# Patient Record
Sex: Female | Born: 1960 | Race: White | Hispanic: No | Smoking: Former smoker
Health system: Southern US, Community
[De-identification: ages and names within clinical notes are randomized; demographics above are authoritative.]

## PROBLEM LIST (undated history)

## (undated) DIAGNOSIS — I10 Essential (primary) hypertension: Secondary | ICD-10-CM

## (undated) DIAGNOSIS — E785 Hyperlipidemia, unspecified: Secondary | ICD-10-CM

## (undated) DIAGNOSIS — J449 Chronic obstructive pulmonary disease, unspecified: Secondary | ICD-10-CM

## (undated) DIAGNOSIS — I099 Rheumatic heart disease, unspecified: Secondary | ICD-10-CM

## (undated) DIAGNOSIS — M1990 Unspecified osteoarthritis, unspecified site: Secondary | ICD-10-CM

## (undated) DIAGNOSIS — G4730 Sleep apnea, unspecified: Secondary | ICD-10-CM

## (undated) DIAGNOSIS — U071 COVID-19: Secondary | ICD-10-CM

## (undated) DIAGNOSIS — I509 Heart failure, unspecified: Secondary | ICD-10-CM

## (undated) DIAGNOSIS — T7840XA Allergy, unspecified, initial encounter: Secondary | ICD-10-CM

## (undated) DIAGNOSIS — K219 Gastro-esophageal reflux disease without esophagitis: Secondary | ICD-10-CM

## (undated) HISTORY — DX: Sleep apnea, unspecified: G47.30

## (undated) HISTORY — DX: Chronic obstructive pulmonary disease, unspecified: J44.9

## (undated) HISTORY — DX: Allergy, unspecified, initial encounter: T78.40XA

## (undated) HISTORY — DX: Unspecified osteoarthritis, unspecified site: M19.90

## (undated) HISTORY — DX: Hyperlipidemia, unspecified: E78.5

## (undated) HISTORY — DX: Gastro-esophageal reflux disease without esophagitis: K21.9

## (undated) HISTORY — DX: Rheumatic heart disease, unspecified: I09.9

## (undated) HISTORY — DX: Essential (primary) hypertension: I10

## (undated) HISTORY — PX: LEG SURGERY: SHX1003

## (undated) HISTORY — PX: CARDIAC CATHETERIZATION: SHX172

## (undated) HISTORY — PX: CORONARY ARTERY BYPASS GRAFT: SHX141

## (undated) HISTORY — DX: Heart failure, unspecified: I50.9

## (undated) HISTORY — PX: CHOLECYSTECTOMY: SHX55

## (undated) HISTORY — PX: TONSILLECTOMY AND ADENOIDECTOMY: SUR1326

## (undated) HISTORY — DX: COVID-19: U07.1

## (undated) HISTORY — PX: CARDIAC VALVE REPLACEMENT: SHX585

## (undated) SURGERY — Surgical Case
Anesthesia: *Unknown

---

## 2015-06-26 ENCOUNTER — Telehealth: Payer: Self-pay | Admitting: Cardiovascular Disease

## 2015-06-26 NOTE — Telephone Encounter (Signed)
Received records from [facility] for appointment on 06/28/15 with Dr Sallyanne Kuster.  Records given to [facility] (medical records) for Dr Croitoru's schedule on 06/28/15. lp

## 2015-06-28 ENCOUNTER — Ambulatory Visit (INDEPENDENT_AMBULATORY_CARE_PROVIDER_SITE_OTHER): Payer: 59 | Admitting: Cardiovascular Disease

## 2015-06-28 ENCOUNTER — Encounter: Payer: Self-pay | Admitting: Cardiovascular Disease

## 2015-06-28 VITALS — BP 168/70 | HR 76 | Ht 65.0 in | Wt 190.0 lb

## 2015-06-28 DIAGNOSIS — R002 Palpitations: Secondary | ICD-10-CM

## 2015-06-28 DIAGNOSIS — I10 Essential (primary) hypertension: Secondary | ICD-10-CM | POA: Diagnosis not present

## 2015-06-28 DIAGNOSIS — R011 Cardiac murmur, unspecified: Secondary | ICD-10-CM | POA: Diagnosis not present

## 2015-06-28 MED ORDER — ATENOLOL 50 MG PO TABS: ORAL_TABLET | ORAL | Status: DC

## 2015-06-28 NOTE — Progress Notes (Signed)
Patient ID: Kimberly Hobbs, female   DOB: [birth year], 55 y.o.   MRN: [id number]    Cardiology Office Note    Date:  06/29/2015   ID:  Kimberly Hobbs, DOB [birth year], MRN [id number]  PCP:  No primary care provider on file.  Cardiologist:   Sanda Klein, MD   Chief Complaint  Patient presents with   New Evaluation   Palpitations    LASTS 2 HOURS DAILY    History of Present Illness:  Kimberly Hobbs is a 55 y.o. female with a long-standing history of hypertension presenting with complaints of palpitations that occur randomly and are most prominent at rest and at night. She describes them as a skipped beats every 2-5 normal beats. She also has symptoms of perimenopausal state and feels that the palpitations have worsened since she began having hot flashes. She denies syncope, exertional dyspnea, exertional angina, leg edema, claudication, focal neurological complaints.  She initially was diagnosed with high blood pressure during pregnancy related toxemia about 16 years ago. Her blood pressure never normalized after pregnancy. It sounds like her blood pressure has never really been well controlled with a typical systolic blood pressure in the 160s range. For the last 3 weeks she increased her atenolol from 25 mg daily to 50 mg daily and finds that both her palpitations and her blood pressure are little better. Her typical systolic blood pressure remains around the 160 mmHg.   She walks approximately a mile in 30 minutes or less on a daily basis; she has to climb up and down steps frequently during the day and does not have any complaints. She does feel that she occasionally becomes short of breath if she "pushes".  She also reports recently having labs performed by her primary care provider. Her thyroid function tests were reportedly normal. Her cholesterol was improved from the past but still over 200. Her glucose was "borderline".  She has a very distinct murmur on physical exam that has not previously  been reported to her.  Past Medical History  Diagnosis Date   Hyperlipidemia    Hypertension     Past Surgical History  Procedure Laterality Date   Tonsillectomy and adenoidectomy     Cholecystectomy     Leg surgery      "metal plate in leg"    Current Medications: No outpatient prescriptions prior to visit.   No facility-administered medications prior to visit.     Allergies:   Iodinated diagnostic agents   Social History   Social History   Marital Status: Unknown    Spouse Name: N/A   Number of Children: N/A   Years of Education: N/A   Social History Main Topics   Smoking status: Current Some Day Smoker   Smokeless tobacco: Never Used   Alcohol Use: Yes   Drug Use: No   Sexual Activity: Not Asked   Other Topics Concern   None   Social History Narrative   Epworth Sleepiness Scale = 4 (as of 06/28/2015)     Family History:  The patient's family history includes Cancer in her father and mother; Diabetes in her father and mother; Heart disease in her father and mother; Hyperlipidemia in her mother; Hypertension in her brother, brother, father, mother, and sister.   ROS:   Please see the history of present illness.    ROS All other systems reviewed and are negative.   PHYSICAL EXAM:   VS:  BP 168/70 mmHg   Pulse 76   Ht 5\' 5"  (1.651  m)   Wt 86.183 kg (190 lb)   BMI 31.62 kg/m2   GEN: Well nourished, well developed, in no acute distress HEENT: normal Neck: no JVD, carotid bruits, or masses Cardiac: RRR; 3/6 holosystolic apical murmur that does not change with the Valsalva maneuver, no diastolic murmurs, rubs, or gallops,no edema  Respiratory:  clear to auscultation bilaterally, normal work of breathing GI: soft, nontender, nondistended, + BS MS: no deformity or atrophy Skin: warm and dry, no rash Neuro:  Alert and Oriented x 3, Strength and sensation are intact Psych: euthymic mood, full affect  Wt Readings from Last 3 Encounters:  06/28/15  86.183 kg (190 lb)      Studies/Labs Reviewed:    Recent Labs: Not available at this time   ASSESSMENT:    1. Murmur, cardiac   2. Essential hypertension   3. Murmur   4. Palpitations      PLAN:  In order of problems listed above:  1. She has a typical murmur of mitral insufficiency.. Recommend an echocardiogram. If she has significant mitral insufficiency, this might explain her tendency to have atrial arrhythmia. I did talk to her about the possible need to have transesophageal echo to better identify the mechanism of mitral insufficiency, if this proves to be significant 2. The pressure is poorly controlled. Talked about the fact that hypertension causes long-term sequelae and will worsen mitral insufficiency. Increase the atenolol to a total of 75 mg daily. 3. I don't think she is describing atrial fibrillation. Will review left atrial size and severity of mitral insufficiency. Decide whether or not we need to some type of rhythm monitor depending on the results of the echo and her response to increase beta blocker dose.    Medication Adjustments/Labs and Tests Ordered: Current medicines are reviewed at length with the patient today.  Concerns regarding medicines are outlined above.  Medication changes, Labs and Tests ordered today are listed in the Patient Instructions below. Patient Instructions  Medication Instructions: Dr Sallyanne Kuster has recommended making the following medication changes: 1. INCREASE Atenolol to 25 mg (0.5 tablet) in the morning and 50 mg (1 tablet) in the evening  Labwork: NONE ORDERED  Testing/Procedures: 1. Echocardiogram - Your physician has requested that you have an echocardiogram. Echocardiography is a painless test that uses sound waves to create images of your heart. It provides your doctor with information about the size and shape of your heart and how well your hearts chambers and valves are working. This procedure takes approximately one  hour. There are no restrictions for this procedure.  Follow-up: Dr Sallyanne Kuster recommends that you schedule a follow-up appointment in 1 month.  If you need a refill on your cardiac medications before your next appointment, please call your pharmacy.    Mikael Spray, MD  06/29/2015 12:15 PM    [facility] [place], [place], [place]  [address] Phone: [phone]; Fax: [phone]

## 2015-06-28 NOTE — Patient Instructions (Signed)
Medication Instructions: Dr Sallyanne Kuster has recommended making the following medication changes: 1. INCREASE Atenolol to 25 mg (0.5 tablet) in the morning and 50 mg (1 tablet) in the evening  Labwork: NONE ORDERED  Testing/Procedures: 1. Echocardiogram - Your physician has requested that you have an echocardiogram. Echocardiography is a painless test that uses sound waves to create images of your heart. It provides your doctor with information about the size and shape of your heart and how well your hearts chambers and valves are working. This procedure takes approximately one hour. There are no restrictions for this procedure.  Follow-up: Dr Sallyanne Kuster recommends that you schedule a follow-up appointment in 1 month.  If you need a refill on your cardiac medications before your next appointment, please call your pharmacy.

## 2015-06-29 ENCOUNTER — Other Ambulatory Visit: Payer: Self-pay

## 2015-06-29 ENCOUNTER — Telehealth: Payer: Self-pay | Admitting: Cardiovascular Disease

## 2015-06-29 DIAGNOSIS — I480 Paroxysmal atrial fibrillation: Secondary | ICD-10-CM

## 2015-06-29 DIAGNOSIS — R002 Palpitations: Secondary | ICD-10-CM | POA: Insufficient documentation

## 2015-06-29 DIAGNOSIS — I4891 Unspecified atrial fibrillation: Secondary | ICD-10-CM | POA: Insufficient documentation

## 2015-06-29 DIAGNOSIS — R011 Cardiac murmur, unspecified: Secondary | ICD-10-CM | POA: Insufficient documentation

## 2015-06-29 DIAGNOSIS — I10 Essential (primary) hypertension: Secondary | ICD-10-CM | POA: Insufficient documentation

## 2015-06-29 HISTORY — DX: Paroxysmal atrial fibrillation: I48.0

## 2015-06-29 MED ORDER — ATENOLOL 50 MG PO TABS: ORAL_TABLET | ORAL | Status: DC

## 2015-06-29 NOTE — Telephone Encounter (Signed)
New message      Calling to let the nurse know they received a refill for atenolol----this needs to be sent to the patient's regular pharmacy

## 2015-07-13 ENCOUNTER — Ambulatory Visit (HOSPITAL_COMMUNITY): Payer: 59 | Attending: Cardiovascular Disease

## 2015-07-13 ENCOUNTER — Other Ambulatory Visit: Payer: Self-pay

## 2015-07-13 DIAGNOSIS — I059 Rheumatic mitral valve disease, unspecified: Secondary | ICD-10-CM | POA: Insufficient documentation

## 2015-07-13 DIAGNOSIS — I351 Nonrheumatic aortic (valve) insufficiency: Secondary | ICD-10-CM | POA: Insufficient documentation

## 2015-07-13 DIAGNOSIS — I119 Hypertensive heart disease without heart failure: Secondary | ICD-10-CM | POA: Insufficient documentation

## 2015-07-13 DIAGNOSIS — Z720 Tobacco use: Secondary | ICD-10-CM | POA: Diagnosis not present

## 2015-07-13 DIAGNOSIS — R011 Cardiac murmur, unspecified: Secondary | ICD-10-CM | POA: Diagnosis not present

## 2015-07-19 ENCOUNTER — Encounter: Payer: Self-pay | Admitting: Cardiovascular Disease

## 2015-07-19 ENCOUNTER — Telehealth: Payer: Self-pay | Admitting: Cardiovascular Disease

## 2015-07-19 NOTE — Telephone Encounter (Signed)
F/u  Pt returning RN phone call- echo results. .Please call back and discuss.

## 2015-07-19 NOTE — Telephone Encounter (Signed)
F/up  Pt called for ECHO results

## 2015-07-20 NOTE — Telephone Encounter (Signed)
Notes Recorded by Dionne Bucy Dell Hurtubise, CMA on 07/19/2015 at 5:08 PM Patient is scheduled for 07/27/2015 with Dr C at 9:30a. Called patient with time and instructions. Patient verbalized understanding. Notes Recorded by Dionne Bucy Makinze Jani, CMA on 07/19/2015 at 3:40 PM Called patient with results. Patient verbalized understanding and agreed with plan. Will get patient scheduled for 07/27/15 with Dr C. Will call patient with date, time and instructions.

## 2015-07-26 ENCOUNTER — Other Ambulatory Visit: Payer: Self-pay | Admitting: Cardiovascular Disease

## 2015-07-26 ENCOUNTER — Telehealth: Payer: Self-pay | Admitting: Cardiovascular Disease

## 2015-07-26 DIAGNOSIS — I340 Nonrheumatic mitral (valve) insufficiency: Secondary | ICD-10-CM

## 2015-07-26 DIAGNOSIS — I050 Rheumatic mitral stenosis: Secondary | ICD-10-CM

## 2015-07-26 DIAGNOSIS — I351 Nonrheumatic aortic (valve) insufficiency: Secondary | ICD-10-CM

## 2015-07-26 NOTE — Telephone Encounter (Signed)
Spoke to patient and clarified instructions for cardioversion in hospital tomorrow 6/8, return to office Friday 6/9. Pt voiced thanks for call, had initial confusion bc she thought Sylvia's appt reminder call was in reference to the cardioversion.

## 2015-07-26 NOTE — Telephone Encounter (Signed)
New Message   Patient c/o Palpitations:  High priority if patient c/o lightheadedness and shortness of breath.  1. How long have you been having palpitations? About a week   2. Are you currently experiencing lightheadedness and shortness of breath? no  3. Have you checked your BP and heart rate? (document readings) 146/72  4. Are you experiencing any other symptoms? Heart skipping beats  Pt states appt was suppose to be 6/8, but provider is not in the office. Pt states she was told her appt would be on this date. I did explain to pt provider would not be in office. Pt would like a call back to discuss and talk about her palpitations. Please call back to discuss

## 2015-07-26 NOTE — Telephone Encounter (Signed)
Please call,pt thought her appt was tomorrow. She can not come Friday and she needs to be seen.

## 2015-07-26 NOTE — Addendum Note (Signed)
Addended by: Diana Eves on: 07/26/2015 08:24 AM   Modules accepted: Orders

## 2015-07-27 ENCOUNTER — Encounter (HOSPITAL_COMMUNITY)
Admission: RE | Disposition: A | Discharge status: Home / Self Care | Payer: Self-pay | Source: Ambulatory Visit | Attending: Cardiovascular Disease

## 2015-07-27 ENCOUNTER — Ambulatory Visit (HOSPITAL_COMMUNITY)
Admission: RE | Admit: 2015-07-27 | Discharge: 2015-07-27 | Disposition: A | Discharge status: Home / Self Care | Payer: 59 | Source: Ambulatory Visit | Attending: Cardiovascular Disease | Admitting: Cardiovascular Disease

## 2015-07-27 ENCOUNTER — Encounter (HOSPITAL_COMMUNITY): Payer: Self-pay | Admitting: *Deleted

## 2015-07-27 ENCOUNTER — Ambulatory Visit (HOSPITAL_BASED_OUTPATIENT_CLINIC_OR_DEPARTMENT_OTHER): Payer: 59

## 2015-07-27 ENCOUNTER — Telehealth: Payer: Self-pay | Admitting: Cardiovascular Disease

## 2015-07-27 DIAGNOSIS — I080 Rheumatic disorders of both mitral and aortic valves: Secondary | ICD-10-CM | POA: Diagnosis not present

## 2015-07-27 DIAGNOSIS — I340 Nonrheumatic mitral (valve) insufficiency: Secondary | ICD-10-CM | POA: Diagnosis not present

## 2015-07-27 DIAGNOSIS — I050 Rheumatic mitral stenosis: Secondary | ICD-10-CM | POA: Insufficient documentation

## 2015-07-27 DIAGNOSIS — F17200 Nicotine dependence, unspecified, uncomplicated: Secondary | ICD-10-CM | POA: Diagnosis not present

## 2015-07-27 DIAGNOSIS — I493 Ventricular premature depolarization: Secondary | ICD-10-CM | POA: Diagnosis not present

## 2015-07-27 DIAGNOSIS — I10 Essential (primary) hypertension: Secondary | ICD-10-CM | POA: Diagnosis not present

## 2015-07-27 DIAGNOSIS — I099 Rheumatic heart disease, unspecified: Secondary | ICD-10-CM | POA: Diagnosis present

## 2015-07-27 DIAGNOSIS — I351 Nonrheumatic aortic (valve) insufficiency: Secondary | ICD-10-CM | POA: Insufficient documentation

## 2015-07-27 HISTORY — PX: TEE WITHOUT CARDIOVERSION: SHX5443

## 2015-07-27 LAB — ECHO TEE
CHL CUP MV M VEL: 108
MV Annulus VTI: 43.7 cm
MVAP: 4.23 cm2
MVG: 5 mmHg
MVSPHT: 52 ms
VTI: 200 cm

## 2015-07-27 SURGERY — ECHOCARDIOGRAM, TRANSESOPHAGEAL
Anesthesia: Moderate Sedation

## 2015-07-27 MED ORDER — MIDAZOLAM HCL 5 MG/ML IJ SOLN
INTRAMUSCULAR | Status: AC
  Filled 2015-07-27: qty 2

## 2015-07-27 MED ORDER — FENTANYL CITRATE (PF) 100 MCG/2ML IJ SOLN
INTRAMUSCULAR | Status: AC
  Filled 2015-07-27: qty 2

## 2015-07-27 MED ORDER — SODIUM CHLORIDE 0.9 % IV SOLN
INTRAVENOUS | Status: DC
  Administered 2015-07-27: 500 mL via INTRAVENOUS

## 2015-07-27 MED ORDER — MIDAZOLAM HCL 10 MG/2ML IJ SOLN
INTRAMUSCULAR | Status: DC | PRN
  Administered 2015-07-27: 1 mg via INTRAVENOUS
  Administered 2015-07-27 (×2): 2 mg via INTRAVENOUS

## 2015-07-27 MED ORDER — FENTANYL CITRATE (PF) 100 MCG/2ML IJ SOLN
INTRAMUSCULAR | Status: DC | PRN
  Administered 2015-07-27: 25 ug via INTRAVENOUS
  Administered 2015-07-27: 50 ug via INTRAVENOUS

## 2015-07-27 MED ORDER — CARVEDILOL 12.5 MG PO TABS: 12.5000 mg | ORAL_TABLET | Freq: Two times a day (BID) | ORAL | Status: DC

## 2015-07-27 MED ORDER — BUTAMBEN-TETRACAINE-BENZOCAINE 2-2-14 % EX AERO
INHALATION_SPRAY | CUTANEOUS | Status: DC | PRN
  Administered 2015-07-27: 2 via TOPICAL

## 2015-07-27 NOTE — Op Note (Signed)
INDICATIONS: rheumatic heart disease  PROCEDURE:   Informed consent was obtained prior to the procedure. The risks, benefits and alternatives for the procedure were discussed and the patient comprehended these risks.  Risks include, but are not limited to, cough, sore throat, vomiting, nausea, somnolence, esophageal and stomach trauma or perforation, bleeding, low blood pressure, aspiration, pneumonia, infection, trauma to the teeth and death.    After a procedural time-out, the oropharynx was anesthetized with 20% benzocaine spray.   During this procedure the patient was administered a total of Versed 5 mg and Fentanyl 75 mcg to achieve and maintain moderate conscious sedation.  The patient's heart rate, blood pressure, and oxygen saturationweare monitored continuously during the procedure. The period of conscious sedation was 13 minutes, of which I was present face-to-face 100% of this time.  The transesophageal probe was inserted in the esophagus and stomach without difficulty and multiple views were obtained.  The patient was kept under observation until the patient left the procedure room.  The patient left the procedure room in stable condition.   Agitated microbubble saline contrast was not administered.  COMPLICATIONS:    There were no immediate complications.  FINDINGS:  Rheumatic mitral and aortic valve changes. Mild-moderate MR, mild MS, mild AI Normal LVEF. PVCs seen during the study, no PACs or atrial fibrillation   RECOMMENDATIONS:   Rheumatic fever prophylaxis. Monitor periodically. Good BP control.  Time Spent Directly with the Patient:  35 minutes   Jun Rightmyer 07/27/2015, 10:23 AM

## 2015-07-27 NOTE — Telephone Encounter (Signed)
Closed encounter °

## 2015-07-27 NOTE — Discharge Instructions (Signed)

## 2015-07-27 NOTE — H&P (View-Only) (Signed)
Patient ID: [name], female   DOB: Jan 31, 1961, 55 y.o.   MRN: [id number]    Cardiology Office Note    Date:  06/29/2015   ID:  Brenton Arbaiza, DOB 1960-11-12, MRN [id number]  PCP:  No primary care provider on file.  Cardiologist:   Sanda Klein, MD   Chief Complaint  Patient presents with   New Evaluation   Palpitations    LASTS 2 HOURS DAILY    History of Present Illness:  Jourdin Ocasio is a 55 y.o. female with a long-standing history of hypertension presenting with complaints of palpitations that occur randomly and are most prominent at rest and at night. She describes them as a skipped beats every 2-5 normal beats. She also has symptoms of perimenopausal state and feels that the palpitations have worsened since she began having hot flashes. She denies syncope, exertional dyspnea, exertional angina, leg edema, claudication, focal neurological complaints.  She initially was diagnosed with high blood pressure during pregnancy related toxemia about 16 years ago. Her blood pressure never normalized after pregnancy. It sounds like her blood pressure has never really been well controlled with a typical systolic blood pressure in the 160s range. For the last 3 weeks she increased her atenolol from 25 mg daily to 50 mg daily and finds that both her palpitations and her blood pressure are little better. Her typical systolic blood pressure remains around the 160 mmHg.   She walks approximately a mile in 30 minutes or less on a daily basis; she has to climb up and down steps frequently during the day and does not have any complaints. She does feel that she occasionally becomes short of breath if she "pushes".  She also reports recently having labs performed by her primary care provider. Her thyroid function tests were reportedly normal. Her cholesterol was improved from the past but still over 200. Her glucose was "borderline".  She has a very distinct murmur on physical exam that has not previously  been reported to her.  Past Medical History  Diagnosis Date   Hyperlipidemia    Hypertension     Past Surgical History  Procedure Laterality Date   Tonsillectomy and adenoidectomy     Cholecystectomy     Leg surgery      "metal plate in leg"    Current Medications: No outpatient prescriptions prior to visit.   No facility-administered medications prior to visit.     Allergies:   Iodinated diagnostic agents   Social History   Social History   Marital Status: Unknown    Spouse Name: N/A   Number of Children: N/A   Years of Education: N/A   Social History Main Topics   Smoking status: Current Some Day Smoker   Smokeless tobacco: Never Used   Alcohol Use: Yes   Drug Use: No   Sexual Activity: Not Asked   Other Topics Concern   None   Social History Narrative   Epworth Sleepiness Scale = 4 (as of 06/28/2015)     Family History:  The patient's family history includes Cancer in her father and mother; Diabetes in her father and mother; Heart disease in her father and mother; Hyperlipidemia in her mother; Hypertension in her brother, brother, father, mother, and sister.   ROS:   Please see the history of present illness.    ROS All other systems reviewed and are negative.   PHYSICAL EXAM:   VS:  BP 168/70 mmHg   Pulse 76   Ht 5\' 5"  (1.651  m)   Wt 86.183 kg (190 lb)   BMI 31.62 kg/m2   GEN: Well nourished, well developed, in no acute distress HEENT: normal Neck: no JVD, carotid bruits, or masses Cardiac: RRR; 3/6 holosystolic apical murmur that does not change with the Valsalva maneuver, no diastolic murmurs, rubs, or gallops,no edema  Respiratory:  clear to auscultation bilaterally, normal work of breathing GI: soft, nontender, nondistended, + BS MS: no deformity or atrophy Skin: warm and dry, no rash Neuro:  Alert and Oriented x 3, Strength and sensation are intact Psych: euthymic mood, full affect  Wt Readings from Last 3 Encounters:  06/28/15  86.183 kg (190 lb)      Studies/Labs Reviewed:    Recent Labs: Not available at this time   ASSESSMENT:    1. Murmur, cardiac   2. Essential hypertension   3. Murmur   4. Palpitations      PLAN:  In order of problems listed above:  1. She has a typical murmur of mitral insufficiency.. Recommend an echocardiogram. If she has significant mitral insufficiency, this might explain her tendency to have atrial arrhythmia. I did talk to her about the possible need to have transesophageal echo to better identify the mechanism of mitral insufficiency, if this proves to be significant 2. The pressure is poorly controlled. Talked about the fact that hypertension causes long-term sequelae and will worsen mitral insufficiency. Increase the atenolol to a total of 75 mg daily. 3. I don't think she is describing atrial fibrillation. Will review left atrial size and severity of mitral insufficiency. Decide whether or not we need to some type of rhythm monitor depending on the results of the echo and her response to increase beta blocker dose.    Medication Adjustments/Labs and Tests Ordered: Current medicines are reviewed at length with the patient today.  Concerns regarding medicines are outlined above.  Medication changes, Labs and Tests ordered today are listed in the Patient Instructions below. Patient Instructions  Medication Instructions: Dr Sallyanne Kuster has recommended making the following medication changes: 1. INCREASE Atenolol to 25 mg (0.5 tablet) in the morning and 50 mg (1 tablet) in the evening  Labwork: NONE ORDERED  Testing/Procedures: 1. Echocardiogram - Your physician has requested that you have an echocardiogram. Echocardiography is a painless test that uses sound waves to create images of your heart. It provides your doctor with information about the size and shape of your heart and how well your hearts chambers and valves are working. This procedure takes approximately one  hour. There are no restrictions for this procedure.  Follow-up: Dr Sallyanne Kuster recommends that you schedule a follow-up appointment in 1 month.  If you need a refill on your cardiac medications before your next appointment, please call your pharmacy.    Mikael Spray, MD  06/29/2015 12:15 PM    [facility] [place], [place], [place]  [address] Phone: [phone]; Fax: [phone]

## 2015-07-27 NOTE — Progress Notes (Signed)
°  Echocardiogram Echocardiogram Transesophageal has been performed.  Kimberly Hobbs M 07/27/2015, 10:40 AM

## 2015-07-27 NOTE — Interval H&P Note (Signed)
History and Physical Interval Note:  07/27/2015 8:52 AM  Kimberly Hobbs  has presented today for surgery, with the diagnosis of MITRAL VALVE AND AORTIC VALVE  The various methods of treatment have been discussed with the patient and family. After consideration of risks, benefits and other options for treatment, the patient has consented to  Procedure(s): TRANSESOPHAGEAL ECHOCARDIOGRAM (TEE) (N/A) as a surgical intervention .  The patient's history has been reviewed, patient examined, no change in status, stable for surgery.  I have reviewed the patient's chart and labs.  Questions were answered to the patient's satisfaction.     [name]

## 2015-07-28 ENCOUNTER — Ambulatory Visit: Payer: 59 | Admitting: Cardiovascular Disease

## 2015-07-28 ENCOUNTER — Encounter (HOSPITAL_COMMUNITY): Payer: Self-pay | Admitting: Cardiovascular Disease

## 2015-09-13 ENCOUNTER — Telehealth: Payer: Self-pay | Admitting: Cardiovascular Disease

## 2015-09-13 MED ORDER — CARVEDILOL 25 MG PO TABS: 25.0000 mg | ORAL_TABLET | Freq: Two times a day (BID) | ORAL | 5 refills | Status: DC

## 2015-09-13 NOTE — Telephone Encounter (Signed)
New Message  Pt call requesting to speak with RN. Pt stays her bp and heart rate have still been racing. Pt states heart has been skipping beats. Pt states appt is a ways off and does not think she can handle her heart racing because hers cause her to be anxious. Pt wants to know if Dr. Sallyanne Kuster could prescribe her something for anxiety. Please call back to discuss

## 2015-09-13 NOTE — Telephone Encounter (Signed)
I would recommend increasing her carvedilol to 25 mg BID, but please ask if she has recently checked her BP. This should help with palpitations. She will have to discuss anxiolytics with her PCP or one of his colleagues. Thanks, [employer]

## 2015-09-13 NOTE — Telephone Encounter (Signed)
Called patient. She states her heart is skipping beats. When her heart skips beats, this causes her anxiety/stress. She states she has anxiety the majority of the day.  She has lost her job due to her condition  Patient wants anxiolytic Patient has a PCP but PCP is out of town Advised that she should contact PCP for this medication - cardiology does not generally Rx these medications  - will defer to MD  She states she is doing everything she can - dropped 30lbs, walks more  There was mention of monitor in last MD note 06/28/15  Patient has [facility] 8/11 w/Dr. Sallyanne Kuster

## 2015-09-13 NOTE — Telephone Encounter (Signed)
Called patient and provided MD recommendations. She agrees to increase coreg from 12.5mg  BID to 25mg  BID. Rx(s) sent to pharmacy electronically.  She reports her BP has been running 130s/60s  Advised that she monitor her BP and HR and bring readings to her appt with Dr. Loletha Grayer on 8/11.   Patient voiced understanding

## 2015-09-29 ENCOUNTER — Encounter (INDEPENDENT_AMBULATORY_CARE_PROVIDER_SITE_OTHER): Payer: Self-pay

## 2015-09-29 ENCOUNTER — Encounter: Payer: Self-pay | Admitting: Cardiovascular Disease

## 2015-09-29 ENCOUNTER — Ambulatory Visit (INDEPENDENT_AMBULATORY_CARE_PROVIDER_SITE_OTHER): Payer: 59 | Admitting: Cardiovascular Disease

## 2015-09-29 VITALS — BP 190/90 | HR 75 | Ht 65.0 in | Wt 172.6 lb

## 2015-09-29 DIAGNOSIS — R002 Palpitations: Secondary | ICD-10-CM

## 2015-09-29 DIAGNOSIS — I080 Rheumatic disorders of both mitral and aortic valves: Secondary | ICD-10-CM

## 2015-09-29 DIAGNOSIS — I10 Essential (primary) hypertension: Secondary | ICD-10-CM | POA: Diagnosis not present

## 2015-09-29 MED ORDER — METOPROLOL SUCCINATE ER 100 MG PO TB24: 100.0000 mg | ORAL_TABLET | Freq: Every day | ORAL | 3 refills | Status: DC

## 2015-09-29 NOTE — Patient Instructions (Signed)
Medication Instructions: Dr Sallyanne Kuster has recommended making the following medication changes: 1. STOP Carvedilol 2. START Metoprolol Succinate 100 mg - take 1 tablet (100 mg total) by mouth daily  Labwork: NONE ORDERED  Testing/Procedures: 1. 24-Hour Holter Monitor - Your physician has recommended that you wear a holter monitor. Holter monitors are medical devices that record the hearts electrical activity. Doctors most often use these monitors to diagnose arrhythmias. Arrhythmias are problems with the speed or rhythm of the heartbeat. The monitor is a small, portable device. You can wear one while you do your normal daily activities. This is usually used to diagnose what is causing palpitations/syncope (passing out).  Follow-up: Dr Sallyanne Kuster recommends that you schedule a follow-up appointment in 6 weeks.  If you need a refill on your cardiac medications before your next appointment, please call your pharmacy.

## 2015-09-29 NOTE — Progress Notes (Signed)
Patient ID: Kimberly Hobbs, female   DOB: Mar 23, 1960, 55 y.o.   MRN: [id number]    Cardiology Office Note    Date:  10/01/2015   ID:  Kimberly Hobbs, DOB [birth year], MRN [id number]  PCP:  Pcp Not In System  Cardiologist:   Sanda Klein, MD   No chief complaint on file.   History of Present Illness:  [name] is a 55 y.o. 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 Also has mild aortic insufficiency.  The palpitations are mostly single isolated beats, but recently she believes she has had some sustained arrhythmia lasting for several minutes. She had improvement on carvedilol, but the palpitations seem to recur around 4 or 5:00 in the afternoon, almost as if her medication is wearing off. She denies syncope, exertional dyspnea, exertional angina, leg edema, claudication, focal neurological complaints.  She initially was diagnosed with high blood pressure during pregnancy related toxemia about 16 years ago. Her blood pressure never normalized after pregnancy. She reports that now her blood pressure at home is consistently in the 120s/60s, but it is very high in the office today at 190/90, even when I rechecked it.  She walks approximately a mile in 30 minutes or less on a daily basis; she has to climb up and down steps frequently during the day and does not have any complaints. Unfortunately, she was dismissed from her job due to numerous absences from medical problems.  Past Medical History:  Diagnosis Date   Hyperlipidemia    Hypertension     Past Surgical History:  Procedure Laterality Date   CHOLECYSTECTOMY     LEG SURGERY     "metal plate in leg"   TEE WITHOUT CARDIOVERSION N/A 07/27/2015   Procedure: TRANSESOPHAGEAL ECHOCARDIOGRAM (TEE);  Surgeon: Sanda Klein, MD;  Location: [facility] END[facility]COPY;  Service: Cardiovascular;   Laterality: N/A;   TONSILLECTOMY AND ADENOIDECTOMY      Current Medications: Outpatient Medications Prior to Visit  Medication Sig Dispense Refill   carvedilol (COREG) 25 MG tablet Take 1 tablet (25 mg total) by mouth 2 (two) times daily with a meal. 60 tablet 5   No facility-administered medications prior to visit.      Allergies:   Iodinated diagnostic agents   Social History   Social History   Marital status: Married    Spouse name: N/A   Number of children: N/A   Years of education: N/A   Social History Main Topics   Smoking status: Current Some Day Smoker   Smokeless tobacco: Never Used   Alcohol use Yes   Drug use: No   Sexual activity: Not Asked   Other Topics Concern   None   Social History Narrative   Epworth Sleepiness Scale = 4 (as of 06/28/2015)     Family History:  The patient's family history includes Cancer in her father and mother; Diabetes in her father and mother; Heart disease in her father and mother; Hyperlipidemia in her mother; Hypertension in her brother, brother, father, mother, and sister.   ROS:   Please see the history of present illness.    ROS All other systems reviewed and are negative.   PHYSICAL EXAM:   VS:  BP (!) 190/90    Pulse 75    Ht 5\' 5"  (1.651 m)    Wt 172 lb 9.6 oz (78.3 kg)    BMI 28.72 kg/m  GEN: Well nourished, well developed, in no acute distress  HEENT: normal  Neck: no JVD, carotid bruits, or masses Cardiac: RRR; 3/6 holosystolic apical murmur that does not change with the Valsalva maneuver, no diastolic murmurs, rubs, or gallops,no edema  Respiratory:  clear to auscultation bilaterally, normal work of breathing GI: soft, nontender, nondistended, + BS MS: no deformity or atrophy  Skin: warm and dry, no rash Neuro:  Alert and Oriented x 3, Strength and sensation are intact Psych: euthymic mood, full affect  Wt Readings from Last 3 Encounters:  09/29/15 172 lb 9.6 oz (78.3 kg)  07/27/15 180 lb (81.6  kg)  06/28/15 190 lb (86.2 kg)      ASSESSMENT:    1. 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 Palpitations   3. Essential hypertension      PLAN:  In order of problems listed above:  1. Rheumatic MR/MS/AR: Echo confirms rheumatic changes of the aortic and mitral allows with the dominant lesion being mild to moderate mitral insufficiency. None of the abnormalities appears hemodynamically significant at this time, but it is possible that the valvular problems are contributing to atrial arrhythmia. Discussed the concept of rheumatic fever prophylaxis, but she is not in the high risk group and routine penicillin does not appear to justify. Pharyngitis should be evaluated and treated promptly. 2. Palpitations: I don't think she is describing atrial fibrillation, is a possibility since she has rheumatic heart disease and would have substantial implications for treatment. Her palpitations are daily. I have recommended that she wear a Holter monitor. If atrial fibrillation is identified she should receive full anticoagulation. Will switch to a long-acting beta blocker, metoprolol succinate to see if this provides better palpitation relief. 3. HTN: Her blood pressure is quite high today, but she has consistently normal blood pressure recordings with her home monitor. Ask her to bring her monitor and to be checked. She might have whitecoat hypertension. Reevaluate after the change in her beta blocker.    Medication Adjustments/Labs and Tests Ordered: Current medicines are reviewed at length with the patient today.  Concerns regarding medicines are outlined above.  Medication changes, Labs and Tests ordered today are listed in the Patient Instructions below. Patient Instructions  Medication Instructions: Dr Sallyanne Kuster has recommended making the following medication changes: 1. STOP Carvedilol 2. START Metoprolol Succinate 100 mg - take 1 tablet (100 mg total) by mouth  daily  Labwork: NONE ORDERED  Testing/Procedures: 1. 24-Hour Holter Monitor - Your physician has recommended that you wear a holter monitor. Holter monitors are medical devices that record the hearts electrical activity. Doctors most often use these monitors to diagnose arrhythmias. Arrhythmias are problems with the speed or rhythm of the heartbeat. The monitor is a small, portable device. You can wear one while you do your normal daily activities. This is usually used to diagnose what is causing palpitations/syncope (passing out).  Follow-up: Dr Sallyanne Kuster recommends that you schedule a follow-up appointment in 6 weeks.  If you need a refill on your cardiac medications before your next appointment, please call your pharmacy.    Signed, Sanda Klein, MD  10/01/2015 11:08 AM    [facility] [place], [place], [place]  [address] Phone: [phone]; Fax: [phone]

## 2015-10-04 ENCOUNTER — Telehealth: Payer: Self-pay | Admitting: Cardiovascular Disease

## 2015-10-04 NOTE — Telephone Encounter (Signed)
Returned call to patient. Advised it can take up to around 2 weeks before she may notice a change in BP readings. Advised to continue to monitor - take no more than 2x daily, about 1-2 hours after taking medication, after she has been resting/sitting for 5-10 minutes at least. Advised to call back if she does not notice a change in BP and to report readings to provider for further med adjustment if necessary. She voiced understanding.

## 2015-10-04 NOTE — Telephone Encounter (Signed)
Patient saw Dr. Loletha Grayer on August 11 and was given new BP medicine.  So far she has not seen decrease in BP. Is that normal?

## 2015-10-05 ENCOUNTER — Encounter (INDEPENDENT_AMBULATORY_CARE_PROVIDER_SITE_OTHER): Payer: Self-pay

## 2015-10-05 ENCOUNTER — Ambulatory Visit (INDEPENDENT_AMBULATORY_CARE_PROVIDER_SITE_OTHER): Payer: 59

## 2015-10-05 DIAGNOSIS — R002 Palpitations: Secondary | ICD-10-CM

## 2015-10-06 ENCOUNTER — Telehealth: Payer: Self-pay | Admitting: Cardiovascular Disease

## 2015-10-06 NOTE — Telephone Encounter (Signed)
Note not needed 

## 2015-10-16 ENCOUNTER — Telehealth: Payer: Self-pay | Admitting: Cardiovascular Disease

## 2015-10-16 NOTE — Addendum Note (Signed)
Addended by: Patria Mane A on: 10/16/2015 03:47 PM   Modules accepted: Orders

## 2015-10-16 NOTE — Telephone Encounter (Signed)
Returned patient call-pt wanted to report BP readings after medication changes on 8/11: (carvedilol was d/c and metoprolol succinate was started)  8/26 148/66 171/74 153/60  8/27 159/93 169/77 168/67  8/28 151/66   Pt also reports that the palpitations decreased initially but last night they came back, although reports she thinks the medication is "trying" to help with the palpitations and has seen a decrease overall.    Advised to continue to monitor BP and I would send to MD Croitoru to make him aware of BP readings.  Pt verbalized understanding.

## 2015-10-16 NOTE — Telephone Encounter (Signed)
Kimberly Hobbs Is calling because Dr. Sallyanne Kuster changed her blood Pressure medication(Metoprolol Succinate ER  100mg  ) and her bottom numbers are low but the the top numbers are running between 165 -170. Please call

## 2015-10-16 NOTE — Telephone Encounter (Signed)
Contacted patient and made aware of MD Croitoru recommendations:  Sanda Klein, MD   10/16/15 3:33 PM  Note    There is room to increase the medication a little bit more. Please have her take the Toprol succinate 100 mg every evening and 50 mg every morning. Okay to cut the 100 mg tablets in half.      Advised to monitor BP and HR and keep a log, call back in 1-2 weeks with readings.  Pt verbalized understanding and advised to call with concerns and/or further questions.

## 2015-10-16 NOTE — Telephone Encounter (Signed)
There is room to increase the medication a little bit more. Please have her take the Toprol succinate 100 mg every evening and 50 mg every morning. Okay to cut the 100 mg tablets in half.

## 2015-11-15 ENCOUNTER — Other Ambulatory Visit: Payer: Self-pay | Admitting: *Deleted

## 2015-11-15 ENCOUNTER — Telehealth: Payer: Self-pay | Admitting: Cardiovascular Disease

## 2015-11-15 MED ORDER — METOPROLOL SUCCINATE ER 100 MG PO TB24: ORAL_TABLET | ORAL | 3 refills | Status: DC

## 2015-11-15 NOTE — Telephone Encounter (Signed)
New Message   *STAT* If patient is at the pharmacy, call can be transferred to refill team.   1. Which medications need to be refilled? (please list name of each medication and dose if known) Metoprolol Succinate 100 mg 24 hr tablet  2. Which pharmacy/location (including street and city if local pharmacy) is medication to be sent to? No pharmacy listed  3. Do they need a 30 day or 90 day supply? 30 day  Pt voiced she has not taken her medication for her heart today and she has sent up the prescription twice and no one has refilled her medication yet.  Was not able to get pharmacy location due to pt immediately hanging up.  Please f/u with pt

## 2015-11-15 NOTE — Telephone Encounter (Signed)
Confirmed w [facility] and called pt back to inform receipt of refill. She voiced understanding and thanks.

## 2015-11-15 NOTE — Telephone Encounter (Signed)
Needs note completion before I can close - thanks.

## 2015-11-15 NOTE — Telephone Encounter (Signed)
Returned call and clarified need. She notes dose was increased but a refill wasn't submitted. Reviewed dose and confirmed she is taking as directed by cardiology. Med refilled at increased count for 90 day dispense (1.5 tabs per day = 135 tabs over 90 days) Informed patient I would call [facility] to confirm receipt of this and coverage.

## 2015-11-17 ENCOUNTER — Encounter: Payer: Self-pay | Admitting: Cardiovascular Disease

## 2015-11-17 ENCOUNTER — Ambulatory Visit (INDEPENDENT_AMBULATORY_CARE_PROVIDER_SITE_OTHER): Payer: 59 | Admitting: Cardiovascular Disease

## 2015-11-17 VITALS — BP 170/80 | HR 64 | Ht 65.0 in | Wt 173.2 lb

## 2015-11-17 DIAGNOSIS — I10 Essential (primary) hypertension: Secondary | ICD-10-CM | POA: Diagnosis not present

## 2015-11-17 DIAGNOSIS — I080 Rheumatic disorders of both mitral and aortic valves: Secondary | ICD-10-CM | POA: Diagnosis not present

## 2015-11-17 DIAGNOSIS — I493 Ventricular premature depolarization: Secondary | ICD-10-CM | POA: Diagnosis not present

## 2015-11-17 MED ORDER — NEBIVOLOL HCL 20 MG PO TABS: 20.0000 mg | ORAL_TABLET | Freq: Every day | ORAL | 11 refills | Status: DC

## 2015-11-17 NOTE — Patient Instructions (Addendum)
Dr Sallyanne Kuster has recommended making the following medication changes: 1. START Bystolic 20 mg - take 1 tablet by mouth daily  Dr C recommends that you schedule a follow-up appointment in 6 months. You will receive a reminder letter in the mail two months in advance. If you don't receive a letter, please call our office to schedule the follow-up appointment.  If you need a refill on your cardiac medications before your next appointment, please call your pharmacy.    Medication samples have been provided to the patient. Drug name: Bystolic 20 mg tabs Qty: 28 tabs LOT: [id number] Exp.Date: 11/2016

## 2015-11-17 NOTE — Progress Notes (Signed)
Patient ID: Kimberly Hobbs, female   DOB: 1960/04/16, 54 y.o.   MRN: [id number]    Cardiology Office Note    Date:  11/17/2015   ID:  Kimberly Hobbs, DOB 06/22/1960, MRN [id number]  PCP:  Pcp Not In System  Cardiologist:   Sanda Klein, MD   Chief Complaint  Patient presents with   Follow-up    History of Present Illness:  Kimberly Hobbs is a 55 y.o. 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 Also has mild aortic insufficiency. Valve changes suggests rheumatic etiology.  Although the situation has improved, palpitations remain her biggest complaint. The higher dose of metoprolol seems to work better than carvedilol did. She continues to have occasional days where the palpitations are quite troublesome. The Holter monitor showed that she has frequent PVCs. There was no evidence of atrial fibrillation.  He complains of nightmares and wonders these could be related to her beta blocker.  She initially was diagnosed with high blood pressure during pregnancy related toxemia about 16 years ago. Her blood pressure never normalized after pregnancy. She reports that now her blood pressure at home is consistently in the 120s/60s, but it is still high when checked in the office.  She walks approximately a mile in 30 minutes or less on a daily basis; she has to climb up and down steps frequently during the day and does not have any complaints. Unfortunately, she was dismissed from her job due to numerous absences from medical problems.  Past Medical History:  Diagnosis Date   Hyperlipidemia    Hypertension     Past Surgical History:  Procedure Laterality Date   CHOLECYSTECTOMY     LEG SURGERY     "metal plate in leg"   TEE WITHOUT CARDIOVERSION N/A 07/27/2015   Procedure: TRANSESOPHAGEAL ECHOCARDIOGRAM (TEE);  Surgeon: Sanda Klein, MD;   Location: [facility] ENDOSCOPY;  Service: Cardiovascular;  Laterality: N/A;   TONSILLECTOMY AND ADENOIDECTOMY      Current Medications: Outpatient Medications Prior to Visit  Medication Sig Dispense Refill   metoprolol succinate (TOPROL-XL) 100 MG 24 hr tablet Take 50 mg (1/2 tablet) in the morning and take 100 mg (1 tablet) in the evening. Take with or immediately following a meal. 135 tablet 3   No facility-administered medications prior to visit.      Allergies:   Iodinated diagnostic agents   Social History   Social History   Marital status: Married    Spouse name: N/A   Number of children: N/A   Years of education: N/A   Social History Main Topics   Smoking status: Current Some Day Smoker   Smokeless tobacco: Never Used   Alcohol use Yes   Drug use: No   Sexual activity: Not Asked   Other Topics Concern   None   Social History Narrative   Epworth Sleepiness Scale = 4 (as of 06/28/2015)     Family History:  The patient's family history includes Cancer in her father and mother; Diabetes in her father and mother; Heart disease in her father and mother; Hyperlipidemia in her mother; Hypertension in her brother, brother, father, mother, and sister.   ROS:   Please see the history of present illness.    ROS All other systems reviewed and are negative.   PHYSICAL EXAM:   VS:  BP (!) 170/80 (BP Location: Right Arm, Patient Position: Sitting, Cuff Size: Normal)  Pulse 64    Ht 5\' 5"  (1.651 m)    Wt 78.6 kg (173 lb 3.2 oz)    SpO2 97%    BMI 28.82 kg/m    GEN: Well nourished, well developed, in no acute distress  HEENT: normal  Neck: no JVD, carotid bruits, or masses Cardiac: RRR; 3/6 holosystolic apical murmur that does not change with the Valsalva maneuver, no diastolic murmurs, rubs, or gallops,no edema  Respiratory:  clear to auscultation bilaterally, normal work of breathing GI: soft, nontender, nondistended, + BS MS: no deformity or atrophy  Skin: warm and  dry, no rash Neuro:  Alert and Oriented x 3, Strength and sensation are intact Psych: euthymic mood, full affect  Wt Readings from Last 3 Encounters:  11/17/15 78.6 kg (173 lb 3.2 oz)  09/29/15 78.3 kg (172 lb 9.6 oz)  07/27/15 81.6 kg (180 lb)      ASSESSMENT:    1. 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 PVCs (premature ventricular contractions)   3. Essential hypertension      PLAN:  In order of problems listed above:  1. Rheumatic MR/MS/AR: Echo confirms rheumatic changes of the aortic and mitral allows with the dominant lesion being mild to moderate mitral insufficiency. None of the abnormalities appears hemodynamically significant at this time. Discussed the concept of rheumatic fever prophylaxis, but she is not in a high risk group and routine penicillin does not appear justified. Pharyngitis should be always evaluated and treated promptly. 2. PVCs: Toprol-XL did work better than carvedilol, but she still has palpitations, has nightmares which might be related to the medication. Will give a trial of nebivolol which does not cross the blood-brain barrier to see if this leads to improvement.. 3. HTN: Her blood pressure is quite high today, but she has consistently normal blood pressure recordings with her home monitor. Still has to bring in her home monitor to be checked.  Reevaluate after the change in her beta blocker.  She'll call us back in a few weeks to tell us how the new medication works for her symptoms and her blood pressure  Medication Adjustments/Labs and Tests Ordered: Current medicines are reviewed at length with the patient today.  Concerns regarding medicines are outlined above.  Medication changes, Labs and Tests ordered today are listed in the Patient Instructions below. Patient Instructions  Dr Sallyanne Kuster has recommended making the following medication changes: 1. START Bystolic 20 mg - take 1 tablet by mouth daily  Dr C recommends that you schedule  a follow-up appointment in 6 months. You will receive a reminder letter in the mail two months in advance. If you don't receive a letter, please call our office to schedule the follow-up appointment.  If you need a refill on your cardiac medications before your next appointment, please call your pharmacy.    Medication samples have been provided to the patient. Drug name: Bystolic 20 mg tabs Qty: 28 tabs LOT: [id number] Exp.Date: 11/2016    SignedSanda Klein, MD  11/17/2015 2:35 PM    [facility], [place], [place]  [address] Phone: [phone]; Fax: [phone]

## 2015-11-22 ENCOUNTER — Telehealth: Payer: Self-pay | Admitting: Cardiovascular Disease

## 2015-11-22 MED ORDER — METOPROLOL SUCCINATE ER 100 MG PO TB24
100.0000 mg | ORAL_TABLET | Freq: Two times a day (BID) | ORAL | 3 refills | Status: DC
Start: 2015-11-22 — End: 2015-12-29

## 2015-11-22 NOTE — Telephone Encounter (Signed)
Please call,concerning her medicine.

## 2015-11-22 NOTE — Telephone Encounter (Signed)
Returned call to patient.  Saw Dr. Sallyanne Kuster last week and was changed from metoprolol to bystolic, w condition that she could return to taking metoprolol if problems. She notes she tried out the bystolic and decided it wasn't for her - was having return of her palpitations.  She went back to taking metoprolol today. Wanted clarification on dose. She took 50mg  this AM and intends to take 100mg  in evening, as this is how her dose is currently written - however, she thinks Dr. Sallyanne Kuster suggested upping metoprolol dose to 100mg  BID if she decided to return to taking it. Aware I will route to provider for clarification & f/u, because these instructions aren't discussed in her OV notes. Recommended to stick to original dose for now.

## 2015-11-22 NOTE — Telephone Encounter (Signed)
Recommendations communicated to patient via VM, sent revised Rx to patient's pharmacy per earlier discussion w patient. Advised in VM message to call if questions.

## 2015-11-22 NOTE — Telephone Encounter (Signed)
Yes, please increase to 100 mg twice daily. Thanks

## 2015-11-29 ENCOUNTER — Telehealth: Payer: Self-pay | Admitting: Cardiovascular Disease

## 2015-11-29 NOTE — Telephone Encounter (Signed)
Please call,heart skipping pretty bad,its really an extra beat.

## 2015-11-29 NOTE — Telephone Encounter (Signed)
Spoke with pt states that she has been having palpitations for the last 3 days. She denies SOB or Chest pain. She states that her BP was been running 140/69 pulse 60 for the last 3 days. She says that she stopped the bystolic and went back to the Metoprolol 100mg  BID. She states that was told to call an let you know. What should she do now? Please advise

## 2015-11-30 NOTE — Telephone Encounter (Signed)
OK to increase the evening dose of metoprolol by another 50 mg (100mg  in AM + 150 mg in PM).

## 2015-11-30 NOTE — Telephone Encounter (Signed)
Did the nightmares get any better when she switched from metoprolol to bystolic? It may take 3 or 4 days for the metoprolol to fully kick in.

## 2015-11-30 NOTE — Telephone Encounter (Signed)
Pt notified verbalizes understanding, will call back with anything new

## 2015-11-30 NOTE — Telephone Encounter (Signed)
Spoke with pt she states that she stopped the Bystolic [id number] and started the Metoprolol 11-17-15 1 week after her appointment. She states that her nightmares have stopped but the palpitations continue. Anything else she should do?

## 2015-12-29 ENCOUNTER — Other Ambulatory Visit: Payer: Self-pay | Admitting: *Deleted

## 2015-12-29 ENCOUNTER — Telehealth: Payer: Self-pay | Admitting: Cardiovascular Disease

## 2015-12-29 MED ORDER — METOPROLOL SUCCINATE ER 100 MG PO TB24: ORAL_TABLET | ORAL | 6 refills | Status: DC

## 2015-12-29 NOTE — Telephone Encounter (Signed)
°*  STAT* If patient is at the pharmacy, call can be transferred to refill team.   1. Which medications need to be refilled? (please list name of each medication and dose if known) New prescription for Metoprolol he increased her dose.She needs this called in today 2. Which pharmacy/location (including street and city if local pharmacy) is medication to be sent to?[phone]  3. Do they need a 30 day or 90 day supply? 30  Days and refills

## 2016-02-05 LAB — HM COLONOSCOPY

## 2016-07-11 DIAGNOSIS — Z124 Encounter for screening for malignant neoplasm of cervix: Secondary | ICD-10-CM | POA: Diagnosis not present

## 2016-07-11 DIAGNOSIS — Z01419 Encounter for gynecological examination (general) (routine) without abnormal findings: Secondary | ICD-10-CM | POA: Diagnosis not present

## 2016-07-22 DIAGNOSIS — M79646 Pain in unspecified finger(s): Secondary | ICD-10-CM | POA: Diagnosis not present

## 2016-07-22 DIAGNOSIS — Z720 Tobacco use: Secondary | ICD-10-CM | POA: Diagnosis not present

## 2016-08-23 ENCOUNTER — Encounter: Payer: Self-pay | Admitting: Cardiology

## 2016-08-23 ENCOUNTER — Ambulatory Visit (INDEPENDENT_AMBULATORY_CARE_PROVIDER_SITE_OTHER): Payer: 59 | Admitting: Cardiology

## 2016-08-23 VITALS — BP 150/84 | HR 68 | Ht 65.0 in | Wt 188.0 lb

## 2016-08-23 DIAGNOSIS — F17200 Nicotine dependence, unspecified, uncomplicated: Secondary | ICD-10-CM

## 2016-08-23 DIAGNOSIS — I080 Rheumatic disorders of both mitral and aortic valves: Secondary | ICD-10-CM | POA: Diagnosis not present

## 2016-08-23 DIAGNOSIS — I493 Ventricular premature depolarization: Secondary | ICD-10-CM

## 2016-08-23 DIAGNOSIS — I10 Essential (primary) hypertension: Secondary | ICD-10-CM | POA: Diagnosis not present

## 2016-08-23 DIAGNOSIS — R002 Palpitations: Secondary | ICD-10-CM

## 2016-08-23 MED ORDER — AMLODIPINE BESYLATE 5 MG PO TABS: 5.0000 mg | ORAL_TABLET | Freq: Every day | ORAL | 3 refills | Status: DC

## 2016-08-23 NOTE — Assessment & Plan Note (Signed)
Very symptomatic for her-requires high dose beta blocker

## 2016-08-23 NOTE — Patient Instructions (Signed)
Medication Instructions:  START amlodipine 5mg  (1 tablet) daily.  Labwork: We will request lab work from Dr. Florene Glen  Testing/Procedures: NONE  Follow-Up: Your physician recommends that you schedule a follow-up appointment in: 3 WEEKS with pharmacist for BP check and 6 MONTHS with Dr. Sallyanne Kuster.   Any Other Special Instructions Will Be Listed Below (If Applicable).     If you need a refill on your cardiac medications before your next appointment, please call your pharmacy.

## 2016-08-23 NOTE — Assessment & Plan Note (Signed)
Mild MS. Mild AS by echo June 2017

## 2016-08-23 NOTE — Progress Notes (Signed)
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 She has mild MS with mild -moderate MR, and mild AS by echo June 2017. She has had symptomatic palpitations requiring high dose beta blocker in the past. Since her LOV she has done well. Klonopin QHs was added by her PCP and this seems to have helped with the palpitations. She denies any unusual dyspnea or edema. She has been monitoring her B/P at home and her systolic is consistently > 140.    Current Outpatient Prescriptions  Medication Sig Dispense Refill  . citalopram (CELEXA) 20 MG tablet Take 20 mg by mouth daily.    . clonazePAM (KLONOPIN) 0.5 MG tablet Take 0.25 mg by mouth daily.     . metoprolol succinate (TOPROL-XL) 100 MG 24 hr tablet Take 1 tablet by mouth daily.    . NON FORMULARY Estrogen oil    . amLODipine (NORVASC) 5 MG tablet Take 1 tablet (5 mg total) by mouth daily. 90 tablet 3   No current facility-administered medications for this visit.     Allergies  Allergen Reactions  . Iodinated Diagnostic Agents Other (See Comments)  . Penicillins Other (See Comments)    Immune to drug , does not work per patient    Past Medical History:  Diagnosis Date  . Hyperlipidemia   . Hypertension   . Rheumatic heart disease    MS/ AS    Social History   Social History  . Marital status: Married    Spouse name: N/A  . Number of children: N/A  . Years of education: N/A   Occupational History  . Not on file.   Social History Main Topics  . Smoking status: Current Some Day Smoker  . Smokeless tobacco: Never Used  . Alcohol use Yes  . Drug use: No  . Sexual activity: Not on file   Other Topics Concern  . Not on file   Social History Narrative   Epworth Sleepiness Scale = 4 (as of 06/28/2015)     Family History  Problem  Relation Age of Onset  . Cancer Mother   . Hypertension Mother   . Diabetes Mother   . Hyperlipidemia Mother   . Heart disease Mother   . Cancer Father   . Hypertension Father   . Diabetes Father   . Heart disease Father   . Hypertension Sister   . Hypertension Brother   . Hypertension Brother      Review of Systems: General: negative for chills, fever, night sweats or weight changes.  Cardiovascular: negative for chest pain, dyspnea on exertion, edema, orthopnea, palpitations, paroxysmal nocturnal dyspnea or shortness of breath Dermatological: negative for rash Respiratory: negative for cough or wheezing Urologic: negative for hematuria Abdominal: negative for nausea, vomiting, diarrhea, bright red blood per rectum, melena, or hematemesis Neurologic: negative for visual changes, syncope, or dizziness All other systems reviewed and are otherwise negative except as noted above.    Blood pressure (!) 150/84, pulse 68, height 5\' 5"  (1.651 m), weight 188 lb (85.3 kg).  General appearance: alert, cooperative, no distress and mildly obese Neck: no carotid bruit and no JVD Lungs: clear to auscultation bilaterally Heart: regular rate and rhythm and 2/6 systolic murmur AOV area with preserved S2, 2/6 MR murmur noted Extremities: extremities normal, atraumatic,  no cyanosis or edema Skin: Skin color, texture, turgor normal. 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 Mild AS by echo June 2017  Palpitations Very symptomatic for her-requires high dose beta blocker  PVCs (premature ventricular contractions) Documented by Holter  Smoker 1/2 ppd  Essential hypertension Uncontrolled   PLAN  I added Norvasc 5 mg, f/u B/P in 2-3 weeks. We discussed smoking cessation. She had lipids done by her OB GYN and i'll see if I can get those results. She has a strong FM Hx of CAD in addition to  smoking and HTN.   Kimberly Shelter PA-C 08/23/2016 11:30 AM

## 2016-08-23 NOTE — Assessment & Plan Note (Signed)
1/2 ppd

## 2016-08-23 NOTE — Assessment & Plan Note (Signed)
Uncontrolled

## 2016-08-23 NOTE — Assessment & Plan Note (Signed)
Documented by Holter

## 2016-08-23 NOTE — Assessment & Plan Note (Signed)
>>  ASSESSMENT AND PLAN FOR PALPITATIONS WRITTEN ON 08/23/2016 11:09 AM BY Rosalyn Gess, LUKE K, PA-C  Very symptomatic for her-requires high dose beta blocker

## 2016-09-12 ENCOUNTER — Ambulatory Visit: Payer: 59

## 2016-09-23 ENCOUNTER — Other Ambulatory Visit: Payer: Self-pay

## 2016-09-23 MED ORDER — METOPROLOL SUCCINATE ER 100 MG PO TB24: 100.0000 mg | ORAL_TABLET | Freq: Every day | ORAL | 3 refills | Status: DC

## 2016-12-23 DIAGNOSIS — Z951 Presence of aortocoronary bypass graft: Secondary | ICD-10-CM | POA: Diagnosis not present

## 2016-12-23 DIAGNOSIS — Z48812 Encounter for surgical aftercare following surgery on the circulatory system: Secondary | ICD-10-CM | POA: Diagnosis not present

## 2016-12-23 DIAGNOSIS — R918 Other nonspecific abnormal finding of lung field: Secondary | ICD-10-CM | POA: Diagnosis not present

## 2016-12-23 DIAGNOSIS — I517 Cardiomegaly: Secondary | ICD-10-CM | POA: Diagnosis not present

## 2016-12-23 DIAGNOSIS — N179 Acute kidney failure, unspecified: Secondary | ICD-10-CM | POA: Diagnosis not present

## 2016-12-23 DIAGNOSIS — I219 Acute myocardial infarction, unspecified: Secondary | ICD-10-CM | POA: Diagnosis not present

## 2016-12-23 DIAGNOSIS — I10 Essential (primary) hypertension: Secondary | ICD-10-CM | POA: Diagnosis not present

## 2016-12-23 DIAGNOSIS — I099 Rheumatic heart disease, unspecified: Secondary | ICD-10-CM | POA: Diagnosis not present

## 2016-12-23 DIAGNOSIS — I351 Nonrheumatic aortic (valve) insufficiency: Secondary | ICD-10-CM | POA: Diagnosis not present

## 2016-12-23 DIAGNOSIS — I2119 ST elevation (STEMI) myocardial infarction involving other coronary artery of inferior wall: Secondary | ICD-10-CM | POA: Diagnosis not present

## 2016-12-23 DIAGNOSIS — I481 Persistent atrial fibrillation: Secondary | ICD-10-CM | POA: Diagnosis not present

## 2016-12-23 DIAGNOSIS — I213 ST elevation (STEMI) myocardial infarction of unspecified site: Secondary | ICD-10-CM | POA: Diagnosis not present

## 2016-12-23 DIAGNOSIS — I050 Rheumatic mitral stenosis: Secondary | ICD-10-CM | POA: Diagnosis not present

## 2016-12-23 DIAGNOSIS — J811 Chronic pulmonary edema: Secondary | ICD-10-CM | POA: Diagnosis not present

## 2016-12-23 DIAGNOSIS — I472 Ventricular tachycardia: Secondary | ICD-10-CM | POA: Diagnosis not present

## 2016-12-23 DIAGNOSIS — I240 Acute coronary thrombosis not resulting in myocardial infarction: Secondary | ICD-10-CM | POA: Diagnosis not present

## 2016-12-23 DIAGNOSIS — I2510 Atherosclerotic heart disease of native coronary artery without angina pectoris: Secondary | ICD-10-CM | POA: Diagnosis not present

## 2016-12-23 DIAGNOSIS — Z4682 Encounter for fitting and adjustment of non-vascular catheter: Secondary | ICD-10-CM | POA: Diagnosis not present

## 2016-12-23 DIAGNOSIS — E782 Mixed hyperlipidemia: Secondary | ICD-10-CM | POA: Diagnosis not present

## 2016-12-23 DIAGNOSIS — R0602 Shortness of breath: Secondary | ICD-10-CM | POA: Diagnosis not present

## 2016-12-23 DIAGNOSIS — Z9889 Other specified postprocedural states: Secondary | ICD-10-CM | POA: Diagnosis not present

## 2016-12-23 DIAGNOSIS — J90 Pleural effusion, not elsewhere classified: Secondary | ICD-10-CM | POA: Diagnosis not present

## 2016-12-23 DIAGNOSIS — Z952 Presence of prosthetic heart valve: Secondary | ICD-10-CM | POA: Diagnosis not present

## 2016-12-23 DIAGNOSIS — I340 Nonrheumatic mitral (valve) insufficiency: Secondary | ICD-10-CM | POA: Diagnosis not present

## 2016-12-23 DIAGNOSIS — I080 Rheumatic disorders of both mitral and aortic valves: Secondary | ICD-10-CM | POA: Diagnosis not present

## 2016-12-23 DIAGNOSIS — I4891 Unspecified atrial fibrillation: Secondary | ICD-10-CM | POA: Diagnosis not present

## 2016-12-23 DIAGNOSIS — J9811 Atelectasis: Secondary | ICD-10-CM | POA: Diagnosis not present

## 2016-12-23 DIAGNOSIS — R002 Palpitations: Secondary | ICD-10-CM | POA: Diagnosis not present

## 2016-12-23 DIAGNOSIS — I214 Non-ST elevation (NSTEMI) myocardial infarction: Secondary | ICD-10-CM | POA: Diagnosis not present

## 2016-12-23 DIAGNOSIS — R0789 Other chest pain: Secondary | ICD-10-CM | POA: Diagnosis not present

## 2017-01-01 DIAGNOSIS — Z952 Presence of prosthetic heart valve: Secondary | ICD-10-CM

## 2017-01-01 HISTORY — DX: Presence of prosthetic heart valve: Z95.2

## 2017-01-08 DIAGNOSIS — Z7901 Long term (current) use of anticoagulants: Secondary | ICD-10-CM | POA: Insufficient documentation

## 2017-01-13 DIAGNOSIS — I099 Rheumatic heart disease, unspecified: Secondary | ICD-10-CM | POA: Diagnosis not present

## 2017-01-13 DIAGNOSIS — I4891 Unspecified atrial fibrillation: Secondary | ICD-10-CM | POA: Diagnosis not present

## 2017-01-17 DIAGNOSIS — Z5181 Encounter for therapeutic drug level monitoring: Secondary | ICD-10-CM | POA: Diagnosis not present

## 2017-01-17 DIAGNOSIS — Z7901 Long term (current) use of anticoagulants: Secondary | ICD-10-CM | POA: Diagnosis not present

## 2017-01-21 DIAGNOSIS — J90 Pleural effusion, not elsewhere classified: Secondary | ICD-10-CM | POA: Diagnosis not present

## 2017-01-21 DIAGNOSIS — Z952 Presence of prosthetic heart valve: Secondary | ICD-10-CM | POA: Diagnosis not present

## 2017-01-21 DIAGNOSIS — Z951 Presence of aortocoronary bypass graft: Secondary | ICD-10-CM | POA: Diagnosis not present

## 2017-01-21 DIAGNOSIS — R918 Other nonspecific abnormal finding of lung field: Secondary | ICD-10-CM | POA: Diagnosis not present

## 2017-01-23 DIAGNOSIS — Z7901 Long term (current) use of anticoagulants: Secondary | ICD-10-CM | POA: Diagnosis not present

## 2017-01-23 DIAGNOSIS — Z5181 Encounter for therapeutic drug level monitoring: Secondary | ICD-10-CM | POA: Diagnosis not present

## 2017-01-24 ENCOUNTER — Other Ambulatory Visit: Payer: Self-pay

## 2017-01-24 MED ORDER — METOPROLOL SUCCINATE ER 100 MG PO TB24: 100.0000 mg | ORAL_TABLET | Freq: Every day | ORAL | 3 refills | Status: DC

## 2017-01-31 DIAGNOSIS — Z7901 Long term (current) use of anticoagulants: Secondary | ICD-10-CM | POA: Diagnosis not present

## 2017-01-31 DIAGNOSIS — Z5181 Encounter for therapeutic drug level monitoring: Secondary | ICD-10-CM | POA: Diagnosis not present

## 2017-02-05 DIAGNOSIS — Z5181 Encounter for therapeutic drug level monitoring: Secondary | ICD-10-CM | POA: Diagnosis not present

## 2017-02-05 DIAGNOSIS — Z7901 Long term (current) use of anticoagulants: Secondary | ICD-10-CM | POA: Diagnosis not present

## 2017-02-13 DIAGNOSIS — Z7901 Long term (current) use of anticoagulants: Secondary | ICD-10-CM | POA: Diagnosis not present

## 2017-02-13 DIAGNOSIS — I099 Rheumatic heart disease, unspecified: Secondary | ICD-10-CM | POA: Diagnosis not present

## 2017-02-24 DIAGNOSIS — Z5181 Encounter for therapeutic drug level monitoring: Secondary | ICD-10-CM | POA: Diagnosis not present

## 2017-02-24 DIAGNOSIS — Z7901 Long term (current) use of anticoagulants: Secondary | ICD-10-CM | POA: Diagnosis not present

## 2017-03-03 DIAGNOSIS — Z7901 Long term (current) use of anticoagulants: Secondary | ICD-10-CM | POA: Diagnosis not present

## 2017-03-03 DIAGNOSIS — Z5181 Encounter for therapeutic drug level monitoring: Secondary | ICD-10-CM | POA: Diagnosis not present

## 2017-03-04 DIAGNOSIS — R0989 Other specified symptoms and signs involving the circulatory and respiratory systems: Secondary | ICD-10-CM | POA: Diagnosis not present

## 2017-03-04 DIAGNOSIS — R05 Cough: Secondary | ICD-10-CM | POA: Diagnosis not present

## 2017-03-04 DIAGNOSIS — I517 Cardiomegaly: Secondary | ICD-10-CM | POA: Diagnosis not present

## 2017-03-04 DIAGNOSIS — Z7901 Long term (current) use of anticoagulants: Secondary | ICD-10-CM | POA: Diagnosis not present

## 2017-03-04 DIAGNOSIS — I10 Essential (primary) hypertension: Secondary | ICD-10-CM | POA: Diagnosis not present

## 2017-03-04 DIAGNOSIS — Z5181 Encounter for therapeutic drug level monitoring: Secondary | ICD-10-CM | POA: Diagnosis not present

## 2017-03-05 DIAGNOSIS — I10 Essential (primary) hypertension: Secondary | ICD-10-CM | POA: Diagnosis not present

## 2017-03-05 DIAGNOSIS — E8770 Fluid overload, unspecified: Secondary | ICD-10-CM | POA: Diagnosis not present

## 2017-03-05 DIAGNOSIS — Z87891 Personal history of nicotine dependence: Secondary | ICD-10-CM | POA: Diagnosis not present

## 2017-03-05 DIAGNOSIS — R609 Edema, unspecified: Secondary | ICD-10-CM | POA: Diagnosis not present

## 2017-03-11 DIAGNOSIS — Z952 Presence of prosthetic heart valve: Secondary | ICD-10-CM | POA: Diagnosis not present

## 2017-03-11 DIAGNOSIS — I099 Rheumatic heart disease, unspecified: Secondary | ICD-10-CM | POA: Diagnosis not present

## 2017-03-11 DIAGNOSIS — I10 Essential (primary) hypertension: Secondary | ICD-10-CM | POA: Diagnosis not present

## 2017-03-12 DIAGNOSIS — Z952 Presence of prosthetic heart valve: Secondary | ICD-10-CM | POA: Diagnosis not present

## 2017-03-12 DIAGNOSIS — Z951 Presence of aortocoronary bypass graft: Secondary | ICD-10-CM | POA: Diagnosis not present

## 2017-03-14 DIAGNOSIS — I4891 Unspecified atrial fibrillation: Secondary | ICD-10-CM | POA: Diagnosis not present

## 2017-03-19 DIAGNOSIS — I099 Rheumatic heart disease, unspecified: Secondary | ICD-10-CM | POA: Diagnosis not present

## 2017-03-19 DIAGNOSIS — Z7901 Long term (current) use of anticoagulants: Secondary | ICD-10-CM | POA: Diagnosis not present

## 2017-03-19 DIAGNOSIS — I10 Essential (primary) hypertension: Secondary | ICD-10-CM | POA: Diagnosis not present

## 2017-04-03 DIAGNOSIS — R05 Cough: Secondary | ICD-10-CM | POA: Diagnosis not present

## 2017-04-07 DIAGNOSIS — Z952 Presence of prosthetic heart valve: Secondary | ICD-10-CM | POA: Diagnosis not present

## 2017-04-07 DIAGNOSIS — Z951 Presence of aortocoronary bypass graft: Secondary | ICD-10-CM | POA: Diagnosis not present

## 2017-04-10 DIAGNOSIS — I10 Essential (primary) hypertension: Secondary | ICD-10-CM | POA: Diagnosis not present

## 2017-04-10 DIAGNOSIS — I480 Paroxysmal atrial fibrillation: Secondary | ICD-10-CM | POA: Diagnosis not present

## 2017-04-10 DIAGNOSIS — Z952 Presence of prosthetic heart valve: Secondary | ICD-10-CM | POA: Diagnosis not present

## 2017-04-18 DIAGNOSIS — Z7901 Long term (current) use of anticoagulants: Secondary | ICD-10-CM | POA: Diagnosis not present

## 2017-04-18 DIAGNOSIS — I4891 Unspecified atrial fibrillation: Secondary | ICD-10-CM | POA: Diagnosis not present

## 2017-05-01 DIAGNOSIS — R05 Cough: Secondary | ICD-10-CM | POA: Diagnosis not present

## 2017-05-01 DIAGNOSIS — I10 Essential (primary) hypertension: Secondary | ICD-10-CM | POA: Diagnosis not present

## 2017-05-02 DIAGNOSIS — Z952 Presence of prosthetic heart valve: Secondary | ICD-10-CM | POA: Diagnosis not present

## 2017-05-02 DIAGNOSIS — Z951 Presence of aortocoronary bypass graft: Secondary | ICD-10-CM | POA: Diagnosis not present

## 2017-05-06 DIAGNOSIS — T462X5A Adverse effect of other antidysrhythmic drugs, initial encounter: Secondary | ICD-10-CM | POA: Diagnosis not present

## 2017-05-07 DIAGNOSIS — Z5181 Encounter for therapeutic drug level monitoring: Secondary | ICD-10-CM | POA: Diagnosis not present

## 2017-05-07 DIAGNOSIS — Z7901 Long term (current) use of anticoagulants: Secondary | ICD-10-CM | POA: Diagnosis not present

## 2017-05-08 DIAGNOSIS — I10 Essential (primary) hypertension: Secondary | ICD-10-CM | POA: Diagnosis not present

## 2017-05-08 DIAGNOSIS — I480 Paroxysmal atrial fibrillation: Secondary | ICD-10-CM | POA: Diagnosis not present

## 2017-05-08 DIAGNOSIS — Z952 Presence of prosthetic heart valve: Secondary | ICD-10-CM | POA: Diagnosis not present

## 2017-05-21 DIAGNOSIS — Z5181 Encounter for therapeutic drug level monitoring: Secondary | ICD-10-CM | POA: Diagnosis not present

## 2017-05-21 DIAGNOSIS — Z7901 Long term (current) use of anticoagulants: Secondary | ICD-10-CM | POA: Diagnosis not present

## 2017-07-02 DIAGNOSIS — Z0000 Encounter for general adult medical examination without abnormal findings: Secondary | ICD-10-CM | POA: Diagnosis not present

## 2017-07-02 DIAGNOSIS — E7800 Pure hypercholesterolemia, unspecified: Secondary | ICD-10-CM | POA: Diagnosis not present

## 2017-07-02 DIAGNOSIS — I252 Old myocardial infarction: Secondary | ICD-10-CM | POA: Diagnosis not present

## 2017-07-02 DIAGNOSIS — I10 Essential (primary) hypertension: Secondary | ICD-10-CM | POA: Diagnosis not present

## 2017-07-02 DIAGNOSIS — Z7901 Long term (current) use of anticoagulants: Secondary | ICD-10-CM | POA: Diagnosis not present

## 2017-07-15 DIAGNOSIS — Z952 Presence of prosthetic heart valve: Secondary | ICD-10-CM | POA: Diagnosis not present

## 2017-07-15 DIAGNOSIS — Z7901 Long term (current) use of anticoagulants: Secondary | ICD-10-CM | POA: Diagnosis not present

## 2017-07-15 DIAGNOSIS — I2510 Atherosclerotic heart disease of native coronary artery without angina pectoris: Secondary | ICD-10-CM | POA: Diagnosis not present

## 2017-07-15 DIAGNOSIS — R0789 Other chest pain: Secondary | ICD-10-CM | POA: Diagnosis not present

## 2017-07-29 ENCOUNTER — Other Ambulatory Visit: Payer: Self-pay | Admitting: Cardiology

## 2017-07-29 ENCOUNTER — Ambulatory Visit
Admission: RE | Admit: 2017-07-29 | Discharge: 2017-07-29 | Disposition: A | Discharge status: Home / Self Care | Payer: 59 | Source: Ambulatory Visit | Attending: Cardiology | Admitting: Cardiology

## 2017-07-29 DIAGNOSIS — Z952 Presence of prosthetic heart valve: Secondary | ICD-10-CM | POA: Diagnosis not present

## 2017-07-29 DIAGNOSIS — R002 Palpitations: Secondary | ICD-10-CM | POA: Diagnosis not present

## 2017-07-29 DIAGNOSIS — Z7901 Long term (current) use of anticoagulants: Secondary | ICD-10-CM | POA: Diagnosis not present

## 2017-07-29 DIAGNOSIS — R079 Chest pain, unspecified: Secondary | ICD-10-CM

## 2017-08-06 DIAGNOSIS — Z952 Presence of prosthetic heart valve: Secondary | ICD-10-CM | POA: Diagnosis not present

## 2017-08-06 DIAGNOSIS — I10 Essential (primary) hypertension: Secondary | ICD-10-CM | POA: Diagnosis not present

## 2017-08-06 DIAGNOSIS — E782 Mixed hyperlipidemia: Secondary | ICD-10-CM | POA: Diagnosis not present

## 2017-08-12 DIAGNOSIS — Z6837 Body mass index (BMI) 37.0-37.9, adult: Secondary | ICD-10-CM | POA: Diagnosis not present

## 2017-08-12 DIAGNOSIS — I10 Essential (primary) hypertension: Secondary | ICD-10-CM | POA: Diagnosis not present

## 2017-08-12 DIAGNOSIS — Z01419 Encounter for gynecological examination (general) (routine) without abnormal findings: Secondary | ICD-10-CM | POA: Diagnosis not present

## 2017-08-19 DIAGNOSIS — I2510 Atherosclerotic heart disease of native coronary artery without angina pectoris: Secondary | ICD-10-CM | POA: Diagnosis not present

## 2017-08-19 DIAGNOSIS — R002 Palpitations: Secondary | ICD-10-CM | POA: Diagnosis not present

## 2017-08-19 DIAGNOSIS — Z952 Presence of prosthetic heart valve: Secondary | ICD-10-CM | POA: Diagnosis not present

## 2017-08-19 DIAGNOSIS — R0789 Other chest pain: Secondary | ICD-10-CM | POA: Diagnosis not present

## 2017-09-23 DIAGNOSIS — Z952 Presence of prosthetic heart valve: Secondary | ICD-10-CM | POA: Diagnosis not present

## 2017-09-23 DIAGNOSIS — Z7901 Long term (current) use of anticoagulants: Secondary | ICD-10-CM | POA: Diagnosis not present

## 2017-10-03 DIAGNOSIS — Z7901 Long term (current) use of anticoagulants: Secondary | ICD-10-CM | POA: Diagnosis not present

## 2017-10-03 DIAGNOSIS — Z952 Presence of prosthetic heart valve: Secondary | ICD-10-CM | POA: Diagnosis not present

## 2017-11-03 DIAGNOSIS — E669 Obesity, unspecified: Secondary | ICD-10-CM | POA: Diagnosis not present

## 2017-11-03 DIAGNOSIS — Z7901 Long term (current) use of anticoagulants: Secondary | ICD-10-CM | POA: Diagnosis not present

## 2017-11-03 DIAGNOSIS — Z23 Encounter for immunization: Secondary | ICD-10-CM | POA: Diagnosis not present

## 2017-11-03 DIAGNOSIS — K219 Gastro-esophageal reflux disease without esophagitis: Secondary | ICD-10-CM | POA: Diagnosis not present

## 2017-11-06 DIAGNOSIS — Z7901 Long term (current) use of anticoagulants: Secondary | ICD-10-CM | POA: Diagnosis not present

## 2017-11-24 DIAGNOSIS — Z952 Presence of prosthetic heart valve: Secondary | ICD-10-CM | POA: Diagnosis not present

## 2017-11-24 DIAGNOSIS — R0789 Other chest pain: Secondary | ICD-10-CM | POA: Diagnosis not present

## 2017-11-24 DIAGNOSIS — I2510 Atherosclerotic heart disease of native coronary artery without angina pectoris: Secondary | ICD-10-CM | POA: Diagnosis not present

## 2017-11-24 DIAGNOSIS — Z7901 Long term (current) use of anticoagulants: Secondary | ICD-10-CM | POA: Diagnosis not present

## 2017-11-25 DIAGNOSIS — R062 Wheezing: Secondary | ICD-10-CM | POA: Diagnosis not present

## 2017-11-25 DIAGNOSIS — R0602 Shortness of breath: Secondary | ICD-10-CM | POA: Diagnosis not present

## 2017-12-02 ENCOUNTER — Encounter: Payer: 59 | Attending: Physician Assistant | Admitting: Registered"

## 2017-12-02 DIAGNOSIS — E669 Obesity, unspecified: Secondary | ICD-10-CM | POA: Insufficient documentation

## 2017-12-02 DIAGNOSIS — Z713 Dietary counseling and surveillance: Secondary | ICD-10-CM | POA: Diagnosis not present

## 2017-12-02 NOTE — Progress Notes (Signed)
Medical Nutrition Therapy:  Appt start time: 8182 end time:  1505.  Assessment:  Primary concerns today: Pt states she is s/p bypass surgery this year Nov 14. Pt states she gained 55 lbs after surgery because she just stayed in bed, stayed with her daughter and mother who would bring her m&ms and ice cream.   Pt states when she was in the hospital after her surgery she was given a list of foods she was told not to eat due to vitamin K, including tuna, blue berries, avocado. Pt states she was also told to eat fish, but tuna is the only fish she eats. Pt states her doctor was adamant that she avoid all foods with vitamin K because her warfarin was working well and didn't want to interfere with it.  Pt also avoids sodium and cholesterol. Patient eats out often. Pt reports her BP is well controlled with her 3 medications. Pt sates when she quit smoking she went from 160 lbs to 215 lbs.   Sleep: 8 hrs good quality usually. Bad dreams 2x week. Stress: 9/10 afraid due to health issues and weight  Preferred Learning Style:   No preference indicated   Learning Readiness:   Contemplating  Ready  MEDICATIONS: reviewed    DIETARY INTAKE:  24-hr recall:  B ( AM): burrito, eggs, cheese, water  Snk ( AM): none  L ( PM): sourdough bread PB&J or [place] Snk ( PM): none OR chewy bars (low sodium) D ( PM): eat out "too much" 4x week pizza OR burger king OR chicken At home, spaghetti OR meatloaf OR beef stroganoff OR salad Snk ( PM): ice cream OR chips Beverages: water  Usual physical activity: was walking 30 min every morning will get back  Now that is is cooler. Bike in house 15-20 min 1x day  Estimated energy needs: 1600 calories  Progress Towards Goal(s):  New goals.   Nutritional Diagnosis:  NB-1.1 Food and nutrition-related knowledge deficit As related to acceptable intake of vitamin K while on coumadin.  As evidenced by patient trying to eliminate all foods with any amount of vitamin  K.    Intervention:  Nutrition Education.  Teaching Method Utilized:  Visual Auditory  Handouts given during visit include:  USDA chart of fish vitamin K content  Nutrition in the Fast Lane (lilly)  Vitamin K content of foods (AND)  Barriers to learning/adherence to lifestyle change: reliance on fast food, fear of food with any vitamin K  Demonstrated degree of understanding via:  Teach Back   Monitoring/Evaluation:  Dietary intake, exercise, and body weight prn.

## 2017-12-02 NOTE — Patient Instructions (Addendum)
[employer] may be an affordable idea for a gym. Call your insurance company to see if they cover gym memberships. Lots of vegetables are great for your heart. Use the list for ones low in vitamin K. Continue to watch your sodium, use booklet for fast food items. Foods to help lower cholesterol, Oatmeal Margarine spreads with plant stanols Omega 3 sources: Walnuts, Flaxseeds, chia seeds Exercise as much as possible

## 2017-12-09 ENCOUNTER — Encounter: Payer: Self-pay | Admitting: Registered"

## 2017-12-16 DIAGNOSIS — M79606 Pain in leg, unspecified: Secondary | ICD-10-CM | POA: Diagnosis not present

## 2017-12-25 DIAGNOSIS — Z7901 Long term (current) use of anticoagulants: Secondary | ICD-10-CM | POA: Diagnosis not present

## 2017-12-25 DIAGNOSIS — Z952 Presence of prosthetic heart valve: Secondary | ICD-10-CM | POA: Diagnosis not present

## 2018-01-09 DIAGNOSIS — R0602 Shortness of breath: Secondary | ICD-10-CM | POA: Diagnosis not present

## 2018-01-23 DIAGNOSIS — Z7901 Long term (current) use of anticoagulants: Secondary | ICD-10-CM | POA: Diagnosis not present

## 2018-02-16 ENCOUNTER — Ambulatory Visit
Admission: RE | Admit: 2018-02-16 | Discharge: 2018-02-16 | Disposition: A | Discharge status: Home / Self Care | Payer: 59 | Source: Ambulatory Visit | Attending: Physician Assistant | Admitting: Physician Assistant

## 2018-02-16 ENCOUNTER — Other Ambulatory Visit: Payer: Self-pay | Admitting: Physician Assistant

## 2018-02-16 DIAGNOSIS — R0602 Shortness of breath: Secondary | ICD-10-CM

## 2018-02-16 DIAGNOSIS — R05 Cough: Secondary | ICD-10-CM | POA: Diagnosis not present

## 2018-02-16 DIAGNOSIS — R059 Cough, unspecified: Secondary | ICD-10-CM

## 2018-02-16 DIAGNOSIS — R062 Wheezing: Secondary | ICD-10-CM

## 2018-02-19 DIAGNOSIS — I10 Essential (primary) hypertension: Secondary | ICD-10-CM | POA: Diagnosis not present

## 2018-02-19 DIAGNOSIS — I25119 Atherosclerotic heart disease of native coronary artery with unspecified angina pectoris: Secondary | ICD-10-CM | POA: Diagnosis not present

## 2018-02-19 DIAGNOSIS — Z7901 Long term (current) use of anticoagulants: Secondary | ICD-10-CM | POA: Diagnosis not present

## 2018-02-19 DIAGNOSIS — Z952 Presence of prosthetic heart valve: Secondary | ICD-10-CM | POA: Diagnosis not present

## 2018-02-26 DIAGNOSIS — Z950 Presence of cardiac pacemaker: Secondary | ICD-10-CM | POA: Diagnosis not present

## 2018-02-26 DIAGNOSIS — I25119 Atherosclerotic heart disease of native coronary artery with unspecified angina pectoris: Secondary | ICD-10-CM | POA: Diagnosis not present

## 2018-02-27 DIAGNOSIS — I2510 Atherosclerotic heart disease of native coronary artery without angina pectoris: Secondary | ICD-10-CM | POA: Diagnosis not present

## 2018-03-06 DIAGNOSIS — I25119 Atherosclerotic heart disease of native coronary artery with unspecified angina pectoris: Secondary | ICD-10-CM | POA: Diagnosis not present

## 2018-03-06 DIAGNOSIS — I10 Essential (primary) hypertension: Secondary | ICD-10-CM | POA: Diagnosis not present

## 2018-03-06 DIAGNOSIS — Z7901 Long term (current) use of anticoagulants: Secondary | ICD-10-CM | POA: Diagnosis not present

## 2018-03-06 DIAGNOSIS — Z952 Presence of prosthetic heart valve: Secondary | ICD-10-CM | POA: Diagnosis not present

## 2018-03-20 DIAGNOSIS — Z7901 Long term (current) use of anticoagulants: Secondary | ICD-10-CM | POA: Diagnosis not present

## 2018-03-27 ENCOUNTER — Ambulatory Visit (INDEPENDENT_AMBULATORY_CARE_PROVIDER_SITE_OTHER): Payer: 59 | Admitting: Cardiology

## 2018-03-27 DIAGNOSIS — Z7901 Long term (current) use of anticoagulants: Secondary | ICD-10-CM

## 2018-03-27 DIAGNOSIS — I340 Nonrheumatic mitral (valve) insufficiency: Secondary | ICD-10-CM | POA: Diagnosis not present

## 2018-03-30 LAB — POCT INR: INR: 3.6 — AB (ref 2.0–3.0)

## 2018-03-30 LAB — PROTIME-INR: INR: 3.8 — AB (ref 0.9–1.1)

## 2018-04-13 ENCOUNTER — Other Ambulatory Visit: Payer: Self-pay | Admitting: Cardiology

## 2018-04-13 DIAGNOSIS — I25119 Atherosclerotic heart disease of native coronary artery with unspecified angina pectoris: Secondary | ICD-10-CM

## 2018-04-14 ENCOUNTER — Encounter: Payer: 59 | Admitting: Cardiology

## 2018-04-14 DIAGNOSIS — I340 Nonrheumatic mitral (valve) insufficiency: Secondary | ICD-10-CM

## 2018-04-14 LAB — POCT INR: INR: 3 (ref 2.0–3.0)

## 2018-04-15 NOTE — Progress Notes (Signed)
INR was 3.6, dose was decreased as noted. Will follow up in 10 days for repeat INR

## 2018-04-23 DIAGNOSIS — I10 Essential (primary) hypertension: Secondary | ICD-10-CM | POA: Diagnosis not present

## 2018-04-23 DIAGNOSIS — Z7901 Long term (current) use of anticoagulants: Secondary | ICD-10-CM | POA: Diagnosis not present

## 2018-04-23 DIAGNOSIS — R040 Epistaxis: Secondary | ICD-10-CM | POA: Diagnosis not present

## 2018-04-23 DIAGNOSIS — I252 Old myocardial infarction: Secondary | ICD-10-CM | POA: Diagnosis not present

## 2018-04-23 DIAGNOSIS — R110 Nausea: Secondary | ICD-10-CM | POA: Diagnosis not present

## 2018-05-05 ENCOUNTER — Other Ambulatory Visit: Payer: Self-pay | Admitting: Cardiology

## 2018-05-11 ENCOUNTER — Other Ambulatory Visit: Payer: Self-pay | Admitting: Cardiology

## 2018-05-13 ENCOUNTER — Other Ambulatory Visit: Payer: Self-pay

## 2018-05-13 ENCOUNTER — Ambulatory Visit (INDEPENDENT_AMBULATORY_CARE_PROVIDER_SITE_OTHER): Payer: 59 | Admitting: Cardiology

## 2018-05-13 DIAGNOSIS — Z7901 Long term (current) use of anticoagulants: Secondary | ICD-10-CM | POA: Diagnosis not present

## 2018-05-13 DIAGNOSIS — I340 Nonrheumatic mitral (valve) insufficiency: Secondary | ICD-10-CM | POA: Diagnosis not present

## 2018-05-13 LAB — POCT INR: INR: 2.8 (ref 2.0–3.0)

## 2018-06-05 ENCOUNTER — Other Ambulatory Visit: Payer: Self-pay

## 2018-06-05 ENCOUNTER — Encounter: Payer: Self-pay | Admitting: Cardiology

## 2018-06-05 ENCOUNTER — Ambulatory Visit (INDEPENDENT_AMBULATORY_CARE_PROVIDER_SITE_OTHER): Payer: 59 | Admitting: Cardiology

## 2018-06-05 VITALS — BP 129/69 | HR 65 | Ht 64.0 in | Wt 210.0 lb

## 2018-06-05 DIAGNOSIS — I2510 Atherosclerotic heart disease of native coronary artery without angina pectoris: Secondary | ICD-10-CM | POA: Insufficient documentation

## 2018-06-05 DIAGNOSIS — I25119 Atherosclerotic heart disease of native coronary artery with unspecified angina pectoris: Secondary | ICD-10-CM

## 2018-06-05 DIAGNOSIS — I25708 Atherosclerosis of coronary artery bypass graft(s), unspecified, with other forms of angina pectoris: Secondary | ICD-10-CM | POA: Insufficient documentation

## 2018-06-05 DIAGNOSIS — R079 Chest pain, unspecified: Secondary | ICD-10-CM | POA: Diagnosis not present

## 2018-06-05 MED ORDER — ISOSORBIDE MONONITRATE ER 60 MG PO TB24: 60.0000 mg | ORAL_TABLET | Freq: Every day | ORAL | 3 refills | Status: DC

## 2018-06-05 NOTE — Progress Notes (Signed)
Telephone visit note  Subjective:   Kimberly Hobbs, female    DOB: 1960/08/18, 58 y.o.   MRN: [id number]   I connected with the patient on 06/05/18 by a telephone call and verified that I am speaking with the correct person using two identifiers.     I offered the patient a video enabled application for a virtual visit. Unfortunately, this could not be accomplished due to technical difficulties/lack of video enabled phone/computer. I discussed the limitations of evaluation and management by telemedicine and the availability of in person appointments. The patient expressed understanding and agreed to proceed.   This visit type was conducted due to national recommendations for restrictions regarding the COVID-19 Pandemic (e.g. social distancing).  This format is felt to be most appropriate for this patient at this time.  All issues noted in this document were discussed and addressed.  No physical exam was performed (except for noted visual exam findings with Tele health visits).  The patient has consented to conduct a Tele health visit and understands insurance will be billed.      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  Patient has had improvement in her exertional chest pressure symptoms. However, she still has them frequently. Overall, she states that she has "good days and bad days:.   On a separate note, she has a lot acid reflux. She states that Omeprazole is not helping.   Past Medical History:  Diagnosis Date  . Hyperlipidemia   . Hypertension   . Rheumatic heart disease    MS/ AS     Past Surgical History:  Procedure Laterality Date  . CHOLECYSTECTOMY    . LEG SURGERY     "metal plate in leg"  . TEE WITHOUT CARDIOVERSION N/A 07/27/2015    Procedure: TRANSESOPHAGEAL ECHOCARDIOGRAM (TEE);  Surgeon: Thurmon Fair, MD;  Location: [facility] ENDOSCOPY;  Service: Cardiovascular;  Laterality: N/A;  . TONSILLECTOMY AND ADENOIDECTOMY       Social History   Socioeconomic History  . Marital status: Married    Spouse name: Not on file  . Number of children: Not on file  . Years of education: Not on file  . Highest education level: Not on file  Occupational History  . Not on file  Social Needs  . Financial resource strain: Not on file  . Food insecurity:    Worry: Not on file    Inability: Not on file  . Transportation needs:    Medical: Not on file    Non-medical: Not on file  Tobacco Use  . Smoking status: Current Some Day Smoker  . Smokeless tobacco: Never Used  Substance and Sexual Activity  . Alcohol use: Yes  . Drug use: No  . Sexual activity: Not on file  Lifestyle  . Physical activity:    Days per week: Not on file    Minutes per session: Not on file  . Stress: Not on file  Relationships  . Social connections:    Talks on phone: Not on file    Gets together: Not on file    Attends religious service: Not on file    Active member of club or organization: Not on file    Attends meetings of clubs or organizations: Not on file    Relationship status: Not on  file  . Intimate partner violence:    Fear of current or ex partner: Not on file    Emotionally abused: Not on file    Physically abused: Not on file    Forced sexual activity: Not on file  Other Topics Concern  . Not on file  Social History Narrative   Epworth Sleepiness Scale = 4 (as of 06/28/2015)     Family History  Problem Relation Age of Onset  . Cancer Mother   . Hypertension Mother   . Diabetes Mother   . Hyperlipidemia Mother   . Heart disease Mother   . Cancer Father   . Hypertension Father   . Diabetes Father   . Heart disease Father   . Hypertension Sister   . Hypertension Brother   . Hypertension Brother      Current Outpatient  Medications on File Prior to Visit  Medication Sig Dispense Refill  . amLODipine (NORVASC) 5 MG tablet Take 1 tablet (5 mg total) by mouth daily. 90 tablet 3  . citalopram (CELEXA) 20 MG tablet Take 20 mg by mouth daily.    . clonazePAM (KLONOPIN) 0.5 MG tablet Take 0.25 mg by mouth daily.     . furosemide (LASIX) 20 MG tablet TAKE 1 TABLET BY MOUTH EVERY DAY 30 tablet 1  . isosorbide mononitrate (IMDUR) 30 MG 24 hr tablet TAKE 1 TABLET BY MOUTH EVERY DAY 30 tablet 1  . losartan (COZAAR) 50 MG tablet TAKE 1 TABLET BY MOUTH EVERY DAY 30 tablet 6  . metoprolol succinate (TOPROL-XL) 100 MG 24 hr tablet TAKE 1 TABLET BY MOUTH TWICE A DAY 60 tablet 6   No current facility-administered medications on file prior to visit.     Cardiovascular studies:  EKG 02/19/2018: Probable sinus rhythm 76 bpm. Poor R wave progression  Lexiscan sestamibi stress test 02/27/2018: 1. Lexiscan stress test with low level exercise was performed. Exercise capacity was not assessed. No stress symptoms reported. Peak effect blood pressure was 220/86 mmHg. The resting electrocardiogram demonstrated normal sinus rhythm, normal resting conduction, no resting arrhythmias and normal rest repolarization.  Stress EKG is non diagnostic for ischemia as it is a pharmacologic stress. In addition, the stress electrocardiogram showed sinus tachycardia, normal stress conduction, occasional PVC, and normal stress repolarization.   2. The overall quality of the study is good.  Left ventricular cavity is noted to be normal on the rest and stress studies.  Gated SPECT images reveal normal myocardial thickening and wall motion.  The left ventricular ejection fraction was calculated or visually estimated to be 61%. Medium sized, mild intensity, partially reversible perfusion defect in mid to apical  inferior/inferolateral myocardium. 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  3. Intermediate risk  study.  Echocardiogram 02/26/2018: Left ventricle cavity is normal in size. Mild concentric hypertrophy of the left ventricle. Abnormal septal wall motion due to post-operative valve. Diastolic function could not be assessed due to post op valve and CABG status.  Calculated EF 63%. Left atrial cavity is moderate to severely dilated measures 4.5 cm in long axis. Right atrial cavity is mildly dilated. Mechanical aortic valve with trace regurgitation. Mild aortic valve leaflet calcification. Mildly restricted aortic valve leaflets. Mild to moderate aortic valve stenosis. Aortic valve peak pressure gradient of  43 and mean gradient of 21 mmHg, calculated aortic valve area    0.88 cm. Mechanical mitral valve with trace regurgitation. Moderately restricted mitral valve leaflets. Mild mitral valve stenosis. Mitral  valve peak pressure gradient of  26  and mean gradient of  6.2  mmHg, calculated mitral valve area 1.9   cm. Mild to moderate tricuspid regurgitation. Mild pulmonary hypertension. PA systolic pressure estimated at 39 mm Hg. Compared to the study done on 08/06/2017, no significant change.  Echocardiogram 08/06/2017: Left ventricle cavity is normal in size. Moderate concentric hypertrophy of the left ventricle. Normal global wall motion. Indeterminate diastolic filling pattern. Calculated EF 63%. Mechanical aortic valve with trace regurgitation. Mild pannus formation on the mechanical AV. Mildly restricted aortic valve leaflets. Mild to moderate aortic valve stenosis. Aortic valve peak pressure gradient of 41 and mean gradient of 22 mmHg, calculated aortic valve area   1.0 cm. Mechanical mitral valve with trace regurgitation. Mild pannus on the mechanical MV. Mildly restricted mitral valve leaflets. Mitral valve peak pressure gradient of 14 and mean gradient of 4 mmHg, calculated mitral valve area 1.8 cm. Mild to moderate tricuspid regurgitation. Mild pulmonary hypertension.   Recent  labs: Labs 02/16/2018: BNP 251 elevated  Labs 07/02/2017: A1c 5.7%, HB 12.9/HCT 38.6, platelets 220, normal indicis.  Serum glucose 90 mg, BUN 23, creatinine 0.75, CMP normal.  Potassium 4.6.  TSH normal.  Review of Systems  Constitution: Negative for decreased appetite, malaise/fatigue, weight gain and weight loss.  HENT: Negative for congestion.   Eyes: Negative for visual disturbance.  Cardiovascular: Positive for chest pain. Negative for dyspnea on exertion, leg swelling, palpitations and syncope.  Respiratory: Negative for shortness of breath.   Endocrine: Negative for cold intolerance.  Hematologic/Lymphatic: Does not bruise/bleed easily.  Skin: Negative for itching and rash.  Musculoskeletal: Negative for myalgias.  Gastrointestinal: Negative for abdominal pain, nausea and vomiting.  Genitourinary: Negative for dysuria.  Neurological: Negative for dizziness and weakness.  Psychiatric/Behavioral: The patient is not nervous/anxious.   All other systems reviewed and are negative.        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  Exertional chest pain, dyspnea: Coronary artery disease s/p CABG (LIMA-LAD, SVG-PDA) 2018 Intermediate risk stress test with LCx region mild peri infarct ischemia. Given persistent symptoms of exertional chest pressue I have increased Imdur from 30 mg to 60 mg daily. I will see her back in 3 months. If angina symptoms persist, will then consider coronary and bypass graft  angiography.    S/p MVR, AVR: Prosthetic valves well functioning. INR goal 2.5-3.5. Continue aspirin 81 mg.  Hypertension: Improved.  Return visit in 3 months   Imo Cumbie Emiliano Dyer, MD [facility] Pager: [phone] Office: [phone] If no answer Cell [phone]

## 2018-06-08 ENCOUNTER — Other Ambulatory Visit: Payer: Self-pay

## 2018-06-08 DIAGNOSIS — I25119 Atherosclerotic heart disease of native coronary artery with unspecified angina pectoris: Secondary | ICD-10-CM

## 2018-06-08 MED ORDER — FUROSEMIDE 20 MG PO TABS: 20.0000 mg | ORAL_TABLET | Freq: Every day | ORAL | 1 refills | Status: DC

## 2018-06-12 ENCOUNTER — Other Ambulatory Visit: Payer: Self-pay

## 2018-06-12 ENCOUNTER — Ambulatory Visit (INDEPENDENT_AMBULATORY_CARE_PROVIDER_SITE_OTHER): Payer: 59 | Admitting: Cardiology

## 2018-06-12 DIAGNOSIS — Z7901 Long term (current) use of anticoagulants: Secondary | ICD-10-CM

## 2018-06-12 DIAGNOSIS — Z5181 Encounter for therapeutic drug level monitoring: Secondary | ICD-10-CM

## 2018-06-12 DIAGNOSIS — Z952 Presence of prosthetic heart valve: Secondary | ICD-10-CM

## 2018-06-12 DIAGNOSIS — I340 Nonrheumatic mitral (valve) insufficiency: Secondary | ICD-10-CM

## 2018-06-12 LAB — POCT INR
INR: 2.5 (ref 2.0–3.0)
INR: 2.5 (ref 2.0–3.0)

## 2018-06-14 NOTE — Progress Notes (Addendum)
Patient's INR is therapeutic; however, she has been trending downward on the last few checks and is borderline low today.  I will increase her dose by 2.5mg . She will take 7.5 mg all days except Monday, she will take 5 mg.  Recheck in 10 days. 01/01/2017- #19 mechanical regent, surgery at [facility]. Mital and aortic valve. Continue ASA 81 mg daily.

## 2018-06-19 ENCOUNTER — Other Ambulatory Visit: Payer: Self-pay

## 2018-06-19 DIAGNOSIS — I25119 Atherosclerotic heart disease of native coronary artery with unspecified angina pectoris: Secondary | ICD-10-CM

## 2018-06-19 MED ORDER — FUROSEMIDE 20 MG PO TABS: 20.0000 mg | ORAL_TABLET | Freq: Every day | ORAL | 1 refills | Status: DC

## 2018-06-26 ENCOUNTER — Other Ambulatory Visit: Payer: Self-pay

## 2018-06-26 ENCOUNTER — Ambulatory Visit (INDEPENDENT_AMBULATORY_CARE_PROVIDER_SITE_OTHER): Payer: 59 | Admitting: Cardiology

## 2018-06-26 DIAGNOSIS — Z5181 Encounter for therapeutic drug level monitoring: Secondary | ICD-10-CM

## 2018-06-26 DIAGNOSIS — D8989 Other specified disorders involving the immune mechanism, not elsewhere classified: Secondary | ICD-10-CM

## 2018-06-26 DIAGNOSIS — Z952 Presence of prosthetic heart valve: Secondary | ICD-10-CM | POA: Diagnosis not present

## 2018-06-26 DIAGNOSIS — R791 Abnormal coagulation profile: Secondary | ICD-10-CM | POA: Diagnosis not present

## 2018-06-26 DIAGNOSIS — Z7901 Long term (current) use of anticoagulants: Secondary | ICD-10-CM

## 2018-06-26 LAB — POCT INR: INR: 2.3 (ref 2.0–3.0)

## 2018-06-26 MED ORDER — ENOXAPARIN SODIUM 100 MG/ML ~~LOC~~ SOLN: 100.0000 mg | Freq: Two times a day (BID) | SUBCUTANEOUS | 0 refills | Status: DC

## 2018-06-26 NOTE — Progress Notes (Signed)
INR 2.3. Goal 2.5-3.5. Lovenox 100 mg bid for  Days Increase Warfarin to 7.5 mg daily (up from 5 mg Monday, 7. 5 mg all other days) Repeat INR check 05/13.  Nigel Mormon, MD

## 2018-07-01 ENCOUNTER — Ambulatory Visit (INDEPENDENT_AMBULATORY_CARE_PROVIDER_SITE_OTHER): Payer: 59 | Admitting: Cardiology

## 2018-07-01 ENCOUNTER — Other Ambulatory Visit: Payer: Self-pay

## 2018-07-01 DIAGNOSIS — Z7901 Long term (current) use of anticoagulants: Secondary | ICD-10-CM

## 2018-07-01 DIAGNOSIS — Z5181 Encounter for therapeutic drug level monitoring: Secondary | ICD-10-CM | POA: Diagnosis not present

## 2018-07-01 DIAGNOSIS — R791 Abnormal coagulation profile: Secondary | ICD-10-CM

## 2018-07-01 DIAGNOSIS — Z952 Presence of prosthetic heart valve: Secondary | ICD-10-CM | POA: Diagnosis not present

## 2018-07-01 DIAGNOSIS — I340 Nonrheumatic mitral (valve) insufficiency: Secondary | ICD-10-CM | POA: Diagnosis not present

## 2018-07-01 LAB — POCT INR
INR: 2 (ref 2.0–3.0)
INR: 2 (ref 2.0–3.0)

## 2018-07-01 MED ORDER — ENOXAPARIN SODIUM 100 MG/ML ~~LOC~~ SOLN: 100.0000 mg | Freq: Two times a day (BID) | SUBCUTANEOUS | 0 refills | Status: DC

## 2018-07-01 NOTE — Progress Notes (Signed)
INR goal 2.5-3.5 INR today 2.0 Recommend Lovenox 100 mg bid for at least 7 more days. Increase warfarin to 7.5 mg M,W,F. Rest 5 mg days. INR check and office visit in 7 days.

## 2018-07-01 NOTE — Addendum Note (Signed)
Addended by: Nigel Mormon on: 07/01/2018 09:29 PM   Modules accepted: Level of Service

## 2018-07-07 ENCOUNTER — Ambulatory Visit (INDEPENDENT_AMBULATORY_CARE_PROVIDER_SITE_OTHER): Payer: 59 | Admitting: Cardiology

## 2018-07-07 ENCOUNTER — Encounter: Payer: Self-pay | Admitting: Cardiology

## 2018-07-07 ENCOUNTER — Other Ambulatory Visit: Payer: Self-pay

## 2018-07-07 VITALS — BP 132/80 | HR 74 | Temp 97.3°F | Ht 64.0 in | Wt 231.0 lb

## 2018-07-07 DIAGNOSIS — I10 Essential (primary) hypertension: Secondary | ICD-10-CM | POA: Diagnosis not present

## 2018-07-07 DIAGNOSIS — Z952 Presence of prosthetic heart valve: Secondary | ICD-10-CM | POA: Diagnosis not present

## 2018-07-07 DIAGNOSIS — L989 Disorder of the skin and subcutaneous tissue, unspecified: Secondary | ICD-10-CM

## 2018-07-07 DIAGNOSIS — R791 Abnormal coagulation profile: Secondary | ICD-10-CM

## 2018-07-07 DIAGNOSIS — I2510 Atherosclerotic heart disease of native coronary artery without angina pectoris: Secondary | ICD-10-CM | POA: Diagnosis not present

## 2018-07-07 LAB — POCT INR: INR: 1.9 — AB (ref 2.0–3.0)

## 2018-07-07 MED ORDER — ENOXAPARIN SODIUM 100 MG/ML ~~LOC~~ SOLN: 100.0000 mg | Freq: Two times a day (BID) | SUBCUTANEOUS | 0 refills | Status: DC

## 2018-07-07 MED ORDER — WARFARIN SODIUM 5 MG PO TABS: 10.0000 mg | ORAL_TABLET | Freq: Every day | ORAL | 2 refills | Status: DC

## 2018-07-07 NOTE — Progress Notes (Signed)
Subjective:   Kimberly Hobbs, female    DOB: 1960-10-29, 58 y.o.   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  Imdur was increased at her last office visit 3 months ago for exertional chest pain. She has not noticed significant improvement in chest pressure with increased dose. On further discussion, most of her symptoms do not occur with exertion. She feels that her chest pressure is related to weight gain. She has been walking on treadmill trying to lose weight and has not had chest pain with this. No significant shortness of breath. No leg edema, PND, or orthopnea.   INR today continues to be low. She has not made any changes to her diet and is not taking any new medications. She has not had any bleeding with Coumadin. Her last INR was 2.0, she is now on Coumadin 10 mg M,W,F and 7.5 all other days. She is also on Lovenox.   1 week ago noticed a small spot on her lower right forearm that is red with black scabbing over it, non-tender. She is unaware of cut or insect bite.   Past Medical History:  Diagnosis Date   Hyperlipidemia    Hypertension    Rheumatic heart disease    MS/ AS     Past Surgical History:  Procedure Laterality Date   CHOLECYSTECTOMY     LEG SURGERY     "metal plate in leg"   TEE WITHOUT CARDIOVERSION N/A 07/27/2015   Procedure: TRANSESOPHAGEAL ECHOCARDIOGRAM (TEE);  Surgeon: Sanda Klein, MD;  Location: [facility] ENDOSCOPY;  Service: Cardiovascular;  Laterality: N/A;   TONSILLECTOMY AND ADENOIDECTOMY       Social History   Socioeconomic History   Marital status: Married    Spouse name: Not on file   Number of children: 4   Years of education: Not on file   Highest education level: Not on file  Occupational  History   Not on file  Social Needs   Financial resource strain: Not on file   Food insecurity:    Worry: Not on file    Inability: Not on file   Transportation needs:    Medical: Not on file    Non-medical: Not on file  Tobacco Use   Smoking status: Former Smoker    Packs/day: 1.50    Years: 40.00    Pack years: 60.00    Types: Cigarettes    Last attempt to quit: 06/04/2017    Years since quitting: 1.0   Smokeless tobacco: Never Used  Substance and Sexual Activity   Alcohol use: Yes   Drug use: No   Sexual activity: Not on file  Lifestyle   Physical activity:    Days per week: Not on file    Minutes per session: Not on file   Stress: Not on file  Relationships   Social connections:    Talks on phone: Not on file    Gets together: Not on file    Attends religious service: Not on file    Active member of club or organization: Not on file    Attends meetings of clubs or organizations: Not on file    Relationship status: Not on file   Intimate partner violence:    Fear of current or ex  partner: Not on file    Emotionally abused: Not on file    Physically abused: Not on file    Forced sexual activity: Not on file  Other Topics Concern   Not on file  Social History Narrative   Epworth Sleepiness Scale = 4 (as of 06/28/2015)     Family History  Problem Relation Age of Onset   Cancer Mother    Hypertension Mother    Diabetes Mother    Hyperlipidemia Mother    Heart disease Mother    Cancer Father    Hypertension Father    Diabetes Father    Heart disease Father    Hypertension Sister    Hypertension Brother    Hypertension Brother      Current Outpatient Medications on File Prior to Visit  Medication Sig Dispense Refill   albuterol (VENTOLIN HFA) 108 (90 Base) MCG/ACT inhaler as needed.     aspirin EC 81 MG tablet Take by mouth daily.     citalopram (CELEXA) 20 MG tablet Take 20 mg by mouth 2 (two) times daily.      clonazePAM  (KLONOPIN) 0.5 MG tablet Take 0.5 mg by mouth daily.      enoxaparin (LOVENOX) 100 MG/ML injection Inject 1 mL (100 mg total) into the skin every 12 (twelve) hours. 14 Syringe 0   fluticasone (FLOVENT DISKUS) 50 MCG/BLIST diskus inhaler 2 (two) times daily.     furosemide (LASIX) 20 MG tablet Take 1 tablet (20 mg total) by mouth daily. 30 tablet 1   isosorbide mononitrate (IMDUR) 60 MG 24 hr tablet Take 1 tablet (60 mg total) by mouth daily. 90 tablet 3   losartan (COZAAR) 50 MG tablet TAKE 1 TABLET BY MOUTH EVERY DAY 30 tablet 6   metoprolol succinate (TOPROL-XL) 100 MG 24 hr tablet TAKE 1 TABLET BY MOUTH TWICE A DAY 60 tablet 6   nitroGLYCERIN (NITROSTAT) 0.4 MG SL tablet as needed.     rosuvastatin (CRESTOR) 40 MG tablet daily.     warfarin (COUMADIN) 5 MG tablet Mon, fri     warfarin (COUMADIN) 7.5 MG tablet Tues, wed, thur, sat, sun     No current facility-administered medications on file prior to visit.     Cardiovascular studies:  EKG 02/19/2018: Probable sinus rhythm 76 bpm. Poor R wave progression  Lexiscan sestamibi stress test 02/27/2018: 1. Lexiscan stress test with low level exercise was performed. Exercise capacity was not assessed. No stress symptoms reported. Peak effect blood pressure was 220/86 mmHg. The resting electrocardiogram demonstrated normal sinus rhythm, normal resting conduction, no resting arrhythmias and normal rest repolarization.  Stress EKG is non diagnostic for ischemia as it is a pharmacologic stress. In addition, the stress electrocardiogram showed sinus tachycardia, normal stress conduction, occasional PVC, and normal stress repolarization.   2. The overall quality of the study is good.  Left ventricular cavity is noted to be normal on the rest and stress studies.  Gated SPECT images reveal normal myocardial thickening and wall motion.  The left ventricular ejection fraction was calculated or visually estimated to be 61%. Medium sized, mild  intensity, partially reversible perfusion defect in mid to apical  inferior/inferolateral myocardium. 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  3. Intermediate risk study.  Echocardiogram 02/26/2018: Left ventricle cavity is normal in size. Mild concentric hypertrophy of the left ventricle. Abnormal septal wall motion due to post-operative valve. Diastolic function could not be assessed due to post op valve and  CABG status.  Calculated EF 63%. Left atrial cavity is moderate to severely dilated measures 4.5 cm in long axis. Right atrial cavity is mildly dilated. Mechanical aortic valve with trace regurgitation. Mild aortic valve leaflet calcification. Mildly restricted aortic valve leaflets. Mild to moderate aortic valve stenosis. Aortic valve peak pressure gradient of  43 and mean gradient of 21 mmHg, calculated aortic valve area    0.88 cm. Mechanical mitral valve with trace regurgitation. Moderately restricted mitral valve leaflets. Mild mitral valve stenosis. Mitral valve peak pressure gradient of  26  and mean gradient of  6.2  mmHg, calculated mitral valve area 1.9   cm. Mild to moderate tricuspid regurgitation. Mild pulmonary hypertension. PA systolic pressure estimated at 39 mm Hg. Compared to the study done on 08/06/2017, no significant change.  Echocardiogram 08/06/2017: Left ventricle cavity is normal in size. Moderate concentric hypertrophy of the left ventricle. Normal global wall motion. Indeterminate diastolic filling pattern. Calculated EF 63%. Mechanical aortic valve with trace regurgitation. Mild pannus formation on the mechanical AV. Mildly restricted aortic valve leaflets. Mild to moderate aortic valve stenosis. Aortic valve peak pressure gradient of 41 and mean gradient of 22 mmHg, calculated aortic valve area   1.0 cm. Mechanical mitral valve with trace regurgitation. Mild pannus on the mechanical MV. Mildly restricted mitral valve  leaflets. Mitral valve peak pressure gradient of 14 and mean gradient of 4 mmHg, calculated mitral valve area 1.8 cm. Mild to moderate tricuspid regurgitation. Mild pulmonary hypertension.   Recent labs: Labs 02/16/2018: BNP 251 elevated  Labs 07/02/2017: A1c 5.7%, HB 12.9/HCT 38.6, platelets 220, normal indicis.  Serum glucose 90 mg, BUN 23, creatinine 0.75, CMP normal.  Potassium 4.6.  TSH normal.  Review of Systems  Constitution: Negative for decreased appetite, malaise/fatigue, weight gain and weight loss.  HENT: Negative for congestion.   Eyes: Negative for visual disturbance.  Cardiovascular: Positive for chest pain. Negative for dyspnea on exertion, leg swelling, palpitations and syncope.  Respiratory: Negative for shortness of breath.   Endocrine: Negative for cold intolerance.  Hematologic/Lymphatic: Does not bruise/bleed easily.  Skin: Negative for itching and rash.  Musculoskeletal: Negative for myalgias.  Gastrointestinal: Negative for abdominal pain, nausea and vomiting.  Genitourinary: Negative for dysuria.  Neurological: Negative for dizziness and weakness.  Psychiatric/Behavioral: The patient is not nervous/anxious.   All other systems reviewed and are negative.        Vitals:   07/07/18 1252  BP: 132/80  Pulse: 74  Temp: (!) 97.3 F (36.3 C)   (Measured by the patient using a home BP monitor)   Objective:     Physical Exam  Constitutional: She is oriented to person, place, and time. Vital signs are normal. She appears well-developed and well-nourished.  HENT:  Head: Normocephalic and atraumatic.  Neck: Normal range of motion.  Cardiovascular: Normal rate, regular rhythm, normal heart sounds and intact distal pulses.  Pulmonary/Chest: Effort normal and breath sounds normal. No accessory muscle usage. No respiratory distress.  midsternal incision  Abdominal: Soft. Bowel sounds are normal.  Musculoskeletal: Normal range of motion.  Neurological: She  is alert and oriented to person, place, and time.  Skin: Skin is warm and dry.  Vitals reviewed.        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  Atherosclerosis of native coronary  artery of native heart without angina pectoris Coronary artery disease s/p CABG (LIMA-LAD, SVG-PDA) 2018 Intermediate risk stress test with LCx region mild peri infarct ischemia. Has not had chest pain with walking on treadmill, and not sure that her chest pain is related to cardiac etiology. Will continue with current medications. Possibly related to weight gain, in which she will continue to work on.    S/p MVR, AVR: Prosthetic valves well functioning. INR goal 2.5-3.5. Continue aspirin 81 mg.  Hypertension: Stable. No changes in medications today.   Subtherapeutic INR:  Previously 2.0, today is 1.9. I have refilled Lovenox for 1 week. Will increase Coumadin to 10 mg daily. Repeat INR in 1 week.  Skin lesion of right arm: Unsure of etiology. No tenderness, drainage, erythema. Question if incidental injury. Will follow up in 1 week, but consider evaluation from PCP.   Return visit in 3 months   *I have discussed this case with Dr. Virgina Jock and he personally examined the patient and participated in formulating the plan.*   Miquel Dunn, MSN, APRN, FNP-C [facility]. [place] Office: [phone] Fax: [phone]

## 2018-07-14 ENCOUNTER — Ambulatory Visit (INDEPENDENT_AMBULATORY_CARE_PROVIDER_SITE_OTHER): Payer: 59 | Admitting: Cardiology

## 2018-07-14 ENCOUNTER — Other Ambulatory Visit: Payer: Self-pay

## 2018-07-14 DIAGNOSIS — Z7901 Long term (current) use of anticoagulants: Secondary | ICD-10-CM | POA: Diagnosis not present

## 2018-07-14 DIAGNOSIS — Z5181 Encounter for therapeutic drug level monitoring: Secondary | ICD-10-CM

## 2018-07-14 DIAGNOSIS — I340 Nonrheumatic mitral (valve) insufficiency: Secondary | ICD-10-CM

## 2018-07-14 DIAGNOSIS — I351 Nonrheumatic aortic (valve) insufficiency: Secondary | ICD-10-CM | POA: Diagnosis not present

## 2018-07-14 LAB — POCT INR
INR: 7.5 — AB (ref 2.0–3.0)
INR: 7.5 — AB (ref 2.0–3.0)

## 2018-07-17 ENCOUNTER — Encounter: Payer: Self-pay | Admitting: Cardiology

## 2018-07-17 ENCOUNTER — Other Ambulatory Visit: Payer: Self-pay

## 2018-07-17 ENCOUNTER — Ambulatory Visit (INDEPENDENT_AMBULATORY_CARE_PROVIDER_SITE_OTHER): Payer: 59 | Admitting: Cardiology

## 2018-07-17 ENCOUNTER — Ambulatory Visit: Payer: 59 | Admitting: Cardiology

## 2018-07-17 ENCOUNTER — Other Ambulatory Visit (HOSPITAL_COMMUNITY)
Admission: RE | Admit: 2018-07-17 | Discharge: 2018-07-17 | Disposition: A | Discharge status: Home / Self Care | Payer: 59 | Source: Ambulatory Visit | Attending: Cardiology | Admitting: Cardiology

## 2018-07-17 VITALS — BP 160/80 | HR 56 | Ht 64.0 in | Wt 230.0 lb

## 2018-07-17 VITALS — BP 160/80 | HR 56 | Ht 64.0 in | Wt 229.3 lb

## 2018-07-17 DIAGNOSIS — Z952 Presence of prosthetic heart valve: Secondary | ICD-10-CM

## 2018-07-17 DIAGNOSIS — Z5181 Encounter for therapeutic drug level monitoring: Secondary | ICD-10-CM | POA: Diagnosis not present

## 2018-07-17 DIAGNOSIS — I340 Nonrheumatic mitral (valve) insufficiency: Secondary | ICD-10-CM

## 2018-07-17 DIAGNOSIS — I25708 Atherosclerosis of coronary artery bypass graft(s), unspecified, with other forms of angina pectoris: Secondary | ICD-10-CM | POA: Diagnosis not present

## 2018-07-17 DIAGNOSIS — Z7901 Long term (current) use of anticoagulants: Secondary | ICD-10-CM | POA: Diagnosis not present

## 2018-07-17 DIAGNOSIS — I10 Essential (primary) hypertension: Secondary | ICD-10-CM | POA: Diagnosis not present

## 2018-07-17 DIAGNOSIS — R791 Abnormal coagulation profile: Secondary | ICD-10-CM

## 2018-07-17 DIAGNOSIS — Z1159 Encounter for screening for other viral diseases: Secondary | ICD-10-CM | POA: Insufficient documentation

## 2018-07-17 DIAGNOSIS — I351 Nonrheumatic aortic (valve) insufficiency: Secondary | ICD-10-CM

## 2018-07-17 DIAGNOSIS — R9439 Abnormal result of other cardiovascular function study: Secondary | ICD-10-CM

## 2018-07-17 HISTORY — DX: Presence of prosthetic heart valve: Z95.2

## 2018-07-17 LAB — POCT INR: INR: 1.1 — AB (ref 2.0–3.0)

## 2018-07-17 MED ORDER — ENOXAPARIN SODIUM 100 MG/ML ~~LOC~~ SOLN: 1.0000 mg/kg | Freq: Two times a day (BID) | SUBCUTANEOUS | 0 refills | Status: DC

## 2018-07-17 NOTE — Progress Notes (Signed)
INR 7.5. Hold warfarin for 3 days. Recheck on 05/29.

## 2018-07-17 NOTE — Progress Notes (Addendum)
Subjective:   Kimberly Hobbs, female    DOB: Apr 17, 1960, 58 y.o.   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  Patient has continued to have exertional chest pressure symptoms. She has to stop after walking 1 min in treadmill due to these symptoms.  On a separate note, patient's INR was noted 7.5 on 0/26, thus warfarin was held. INR today is 1.1  Past Medical History:  Diagnosis Date   Hyperlipidemia    Hypertension    Rheumatic heart disease    MS/ AS     Past Surgical History:  Procedure Laterality Date   CHOLECYSTECTOMY     LEG SURGERY     "metal plate in leg"   TEE WITHOUT CARDIOVERSION N/A 07/27/2015   Procedure: TRANSESOPHAGEAL ECHOCARDIOGRAM (TEE);  Surgeon: Sanda Klein, MD;  Location: [facility] ENDOSCOPY;  Service: Cardiovascular;  Laterality: N/A;   TONSILLECTOMY AND ADENOIDECTOMY       Social History   Socioeconomic History   Marital status: Married    Spouse name: Not on file   Number of children: 4   Years of education: Not on file   Highest education level: Not on file  Occupational History   Not on file  Social Needs   Financial resource strain: Not on file   Food insecurity:    Worry: Not on file    Inability: Not on file   Transportation needs:    Medical: Not on file    Non-medical: Not on file  Tobacco Use   Smoking status: Former Smoker    Packs/day: 1.50    Years: 40.00    Pack years: 60.00    Types: Cigarettes    Last attempt to quit: 06/04/2017    Years since quitting: 1.1   Smokeless tobacco: Never Used  Substance and Sexual Activity   Alcohol use: Yes   Drug use: No   Sexual activity: Not on file  Lifestyle   Physical activity:    Days per week: Not on file    Minutes  per session: Not on file   Stress: Not on file  Relationships   Social connections:    Talks on phone: Not on file    Gets together: Not on file    Attends religious service: Not on file    Active member of club or organization: Not on file    Attends meetings of clubs or organizations: Not on file    Relationship status: Not on file   Intimate partner violence:    Fear of current or ex partner: Not on file    Emotionally abused: Not on file    Physically abused: Not on file    Forced sexual activity: Not on file  Other Topics Concern   Not on file  Social History Narrative   Epworth Sleepiness Scale = 4 (as of 06/28/2015)     Family History  Problem Relation Age of Onset   Cancer Mother    Hypertension Mother    Diabetes Mother    Hyperlipidemia Mother    Heart disease Mother    Cancer Father    Hypertension Father    Diabetes Father    Heart disease Father    Hypertension Sister    Hypertension Brother  Hypertension Brother      Current Outpatient Medications on File Prior to Visit  Medication Sig Dispense Refill   albuterol (VENTOLIN HFA) 108 (90 Base) MCG/ACT inhaler as needed.     aspirin EC 81 MG tablet Take by mouth daily.     citalopram (CELEXA) 20 MG tablet Take 20 mg by mouth 2 (two) times daily.      clonazePAM (KLONOPIN) 0.5 MG tablet Take 0.5 mg by mouth daily.      enoxaparin (LOVENOX) 100 MG/ML injection Inject 1.05 mLs (105 mg total) into the skin every 12 (twelve) hours. 30 Syringe 0   fluticasone (FLOVENT DISKUS) 50 MCG/BLIST diskus inhaler 2 (two) times daily.     furosemide (LASIX) 20 MG tablet Take 1 tablet (20 mg total) by mouth daily. 30 tablet 1   isosorbide mononitrate (IMDUR) 60 MG 24 hr tablet Take 1 tablet (60 mg total) by mouth daily. 90 tablet 3   losartan (COZAAR) 50 MG tablet TAKE 1 TABLET BY MOUTH EVERY DAY 30 tablet 6   metoprolol succinate (TOPROL-XL) 100 MG 24 hr tablet TAKE 1 TABLET BY MOUTH TWICE A DAY  60 tablet 6   nitroGLYCERIN (NITROSTAT) 0.4 MG SL tablet as needed.     rosuvastatin (CRESTOR) 40 MG tablet Take 40 mg by mouth daily.      warfarin (COUMADIN) 5 MG tablet Take 2 tablets (10 mg total) by mouth daily at 6 PM. Mon, fri 60 tablet 2   No current facility-administered medications on file prior to visit.     Cardiovascular studies:  EKG 02/19/2018: Probable sinus rhythm 76 bpm. Poor R wave progression  Lexiscan sestamibi stress test 02/27/2018: 1. Lexiscan stress test with low level exercise was performed. Exercise capacity was not assessed. No stress symptoms reported. Peak effect blood pressure was 220/86 mmHg. The resting electrocardiogram demonstrated normal sinus rhythm, normal resting conduction, no resting arrhythmias and normal rest repolarization.  Stress EKG is non diagnostic for ischemia as it is a pharmacologic stress. In addition, the stress electrocardiogram showed sinus tachycardia, normal stress conduction, occasional PVC, and normal stress repolarization.   2. The overall quality of the study is good.  Left ventricular cavity is noted to be normal on the rest and stress studies.  Gated SPECT images reveal normal myocardial thickening and wall motion.  The left ventricular ejection fraction was calculated or visually estimated to be 61%. Medium sized, mild intensity, partially reversible perfusion defect in mid to apical  inferior/inferolateral myocardium. 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  3. Intermediate risk study.  Echocardiogram 02/26/2018: Left ventricle cavity is normal in size. Mild concentric hypertrophy of the left ventricle. Abnormal septal wall motion due to post-operative valve. Diastolic function could not be assessed due to post op valve and CABG status.  Calculated EF 63%. Left atrial cavity is moderate to severely dilated measures 4.5 cm in long axis. Right atrial cavity is mildly dilated. Mechanical  aortic valve with trace regurgitation. Mild aortic valve leaflet calcification. Mildly restricted aortic valve leaflets. Mild to moderate aortic valve stenosis. Aortic valve peak pressure gradient of  43 and mean gradient of 21 mmHg, calculated aortic valve area    0.88 cm. Mechanical mitral valve with trace regurgitation. Moderately restricted mitral valve leaflets. Mild mitral valve stenosis. Mitral valve peak pressure gradient of  26  and mean gradient of  6.2  mmHg, calculated mitral valve area 1.9   cm. Mild to moderate tricuspid regurgitation. Mild pulmonary  hypertension. PA systolic pressure estimated at 39 mm Hg. Compared to the study done on 08/06/2017, no significant change.  Echocardiogram 08/06/2017: Left ventricle cavity is normal in size. Moderate concentric hypertrophy of the left ventricle. Normal global wall motion. Indeterminate diastolic filling pattern. Calculated EF 63%. Mechanical aortic valve with trace regurgitation. Mild pannus formation on the mechanical AV. Mildly restricted aortic valve leaflets. Mild to moderate aortic valve stenosis. Aortic valve peak pressure gradient of 41 and mean gradient of 22 mmHg, calculated aortic valve area   1.0 cm. Mechanical mitral valve with trace regurgitation. Mild pannus on the mechanical MV. Mildly restricted mitral valve leaflets. Mitral valve peak pressure gradient of 14 and mean gradient of 4 mmHg, calculated mitral valve area 1.8 cm. Mild to moderate tricuspid regurgitation. Mild pulmonary hypertension.   Recent labs: Labs 02/16/2018: BNP 251 elevated  Labs 07/02/2017: A1c 5.7%, HB 12.9/HCT 38.6, platelets 220, normal indicis.  Serum glucose 90 mg, BUN 23, creatinine 0.75, CMP normal.  Potassium 4.6.  TSH normal.  Review of Systems  Constitution: Negative for decreased appetite, malaise/fatigue, weight gain and weight loss.  HENT: Negative for congestion.   Eyes: Negative for visual disturbance.  Cardiovascular: Positive  for chest pain and dyspnea on exertion. Negative for leg swelling, palpitations and syncope.  Respiratory: Positive for shortness of breath.   Endocrine: Negative for cold intolerance.  Hematologic/Lymphatic: Does not bruise/bleed easily.  Skin: Negative for itching and rash.  Musculoskeletal: Negative for myalgias.  Gastrointestinal: Negative for abdominal pain, nausea and vomiting.  Genitourinary: Negative for dysuria.  Neurological: Negative for dizziness and weakness.  Psychiatric/Behavioral: The patient is not nervous/anxious.   All other systems reviewed and are negative.        Vitals:   07/17/18 1615  BP: (!) 160/80  Pulse: (!) 56  SpO2: 96%   (Measured by the patient using a home BP monitor)   Objective:     Physical Exam  Constitutional: She is oriented to person, place, and time. She appears well-developed and well-nourished. No distress.  HENT:  Head: Normocephalic and atraumatic.  Eyes: Pupils are equal, round, and reactive to light. Conjunctivae are normal.  Neck: No JVD present.  Cardiovascular: Normal rate, regular rhythm and intact distal pulses.  Murmur heard.  Harsh midsystolic murmur is present with a grade of 2/6 at the upper right sternal border radiating to the neck. Crisp metallic S1 & S2. No diastolic murmur heard.  Pulmonary/Chest: Effort normal and breath sounds normal. She has no wheezes. She has no rales.  Abdominal: Soft. Bowel sounds are normal. There is no rebound.  Musculoskeletal:        General: No edema.  Lymphadenopathy:    She has no cervical adenopathy.  Neurological: She is alert and oriented to person, place, and time. No cranial nerve deficit.  Skin: Skin is warm and dry.  Psychiatric: She has a normal mood and affect.  Nursing note and vitals reviewed.        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  Exertional chest pain, dyspnea: Coronary artery disease s/p CABG (LIMA-LAD, SVG-PDA) 2018 Intermediate risk stress test with LCx region mild peri infarct ischemia. Transthoracic echocardiogram has showed modest aortic valve area of 1.0 cm2 with  mean PG 14 mmHg, mean mitral valve PG 4 mmHg. Given persistent symptoms, I will proceed with coronary and bypass graft angiography, right heart catheterization and TEE for definitive evaluation. Given her subtherapeutic INR, I am also concerned about her mechanical valves, although I do hear metallic S1 & S2. Fluoroscopy and TEE will further help assess this.    S/p MVR, AVR, subtherapeutic INR: I suspect INR on 07/14/2018 was erroneous. She is back to 1.1. Resume lovenox. Resume warfarin 7. Mg daily after cath Prosthetic valves well functioning. INR goal 2.5-3.5. Continue aspirin 81 mg.  Hypertension: Subotpimal control.  Will adjust therapy after cath.   Return visit in 3 months   [place]al, MD [facility] [place] Pager: [phone] Office: [phone] If no answer Cell [phone]

## 2018-07-17 NOTE — Progress Notes (Signed)
INR 1.1. I suspect last INR was erroneous.  Resume Lovenox. Warfarin help pending coronary and bypass graft angiography, right heart cath, and TEE. Please office note from today.

## 2018-07-18 LAB — CBC
Hematocrit: 36 % (ref 34.0–46.6)
Hemoglobin: 11.6 g/dL (ref 11.1–15.9)
MCH: 29.6 pg (ref 26.6–33.0)
MCHC: 32.2 g/dL (ref 31.5–35.7)
MCV: 92 fL (ref 79–97)
Platelets: 205 10*3/uL (ref 150–450)
RBC: 3.92 x10E6/uL (ref 3.77–5.28)
RDW: 15.2 % (ref 11.7–15.4)
WBC: 8.1 10*3/uL (ref 3.4–10.8)

## 2018-07-18 LAB — BASIC METABOLIC PANEL
BUN/Creatinine Ratio: 29 — ABNORMAL HIGH (ref 9–23)
BUN: 21 mg/dL (ref 6–24)
CO2: 23 mmol/L (ref 20–29)
Calcium: 9.7 mg/dL (ref 8.7–10.2)
Chloride: 101 mmol/L (ref 96–106)
Creatinine, Ser: 0.72 mg/dL (ref 0.57–1.00)
GFR calc Af Amer: 108 mL/min/{1.73_m2} (ref >59)
GFR calc non Af Amer: 94 mL/min/{1.73_m2} (ref >59)
Glucose: 104 mg/dL — ABNORMAL HIGH (ref 65–99)
Potassium: 4.6 mmol/L (ref 3.5–5.2)
Sodium: 143 mmol/L (ref 134–144)

## 2018-07-18 LAB — NOVEL CORONAVIRUS, NAA (HOSP ORDER, SEND-OUT TO REF LAB; TAT 18-24 HRS): SARS-CoV-2, NAA: NOT DETECTED

## 2018-07-18 LAB — PROTIME-INR
INR: 1.1 (ref 0.8–1.2)
Prothrombin Time: 11.2 s (ref 9.1–12.0)

## 2018-07-21 ENCOUNTER — Ambulatory Visit (HOSPITAL_COMMUNITY): Payer: 59

## 2018-07-21 ENCOUNTER — Ambulatory Visit (HOSPITAL_COMMUNITY): Payer: 59 | Admitting: Registered Nurse

## 2018-07-21 ENCOUNTER — Encounter (HOSPITAL_COMMUNITY): Payer: Self-pay | Admitting: *Deleted

## 2018-07-21 ENCOUNTER — Encounter (HOSPITAL_COMMUNITY)
Admission: RE | Disposition: A | Discharge status: Home / Self Care | Payer: Self-pay | Source: Home / Self Care | Attending: Cardiology

## 2018-07-21 ENCOUNTER — Other Ambulatory Visit: Payer: Self-pay

## 2018-07-21 ENCOUNTER — Ambulatory Visit (HOSPITAL_COMMUNITY)
Admission: RE | Admit: 2018-07-21 | Discharge: 2018-07-21 | Disposition: A | Discharge status: Home / Self Care | Payer: 59 | Attending: Cardiology | Admitting: Cardiology

## 2018-07-21 ENCOUNTER — Other Ambulatory Visit: Payer: Self-pay | Admitting: Cardiology

## 2018-07-21 DIAGNOSIS — I2510 Atherosclerotic heart disease of native coronary artery without angina pectoris: Secondary | ICD-10-CM | POA: Diagnosis not present

## 2018-07-21 DIAGNOSIS — I350 Nonrheumatic aortic (valve) stenosis: Secondary | ICD-10-CM | POA: Insufficient documentation

## 2018-07-21 DIAGNOSIS — I25118 Atherosclerotic heart disease of native coronary artery with other forms of angina pectoris: Secondary | ICD-10-CM

## 2018-07-21 DIAGNOSIS — I25708 Atherosclerosis of coronary artery bypass graft(s), unspecified, with other forms of angina pectoris: Secondary | ICD-10-CM

## 2018-07-21 DIAGNOSIS — E785 Hyperlipidemia, unspecified: Secondary | ICD-10-CM | POA: Insufficient documentation

## 2018-07-21 DIAGNOSIS — I10 Essential (primary) hypertension: Secondary | ICD-10-CM | POA: Insufficient documentation

## 2018-07-21 DIAGNOSIS — Z7901 Long term (current) use of anticoagulants: Secondary | ICD-10-CM | POA: Insufficient documentation

## 2018-07-21 DIAGNOSIS — Z7951 Long term (current) use of inhaled steroids: Secondary | ICD-10-CM | POA: Insufficient documentation

## 2018-07-21 DIAGNOSIS — I272 Pulmonary hypertension, unspecified: Secondary | ICD-10-CM | POA: Diagnosis not present

## 2018-07-21 DIAGNOSIS — I25119 Atherosclerotic heart disease of native coronary artery with unspecified angina pectoris: Secondary | ICD-10-CM

## 2018-07-21 DIAGNOSIS — R0789 Other chest pain: Secondary | ICD-10-CM | POA: Diagnosis present

## 2018-07-21 DIAGNOSIS — Z951 Presence of aortocoronary bypass graft: Secondary | ICD-10-CM | POA: Insufficient documentation

## 2018-07-21 DIAGNOSIS — Z79899 Other long term (current) drug therapy: Secondary | ICD-10-CM | POA: Diagnosis not present

## 2018-07-21 DIAGNOSIS — I4891 Unspecified atrial fibrillation: Secondary | ICD-10-CM | POA: Insufficient documentation

## 2018-07-21 DIAGNOSIS — Z87891 Personal history of nicotine dependence: Secondary | ICD-10-CM | POA: Insufficient documentation

## 2018-07-21 DIAGNOSIS — Z7982 Long term (current) use of aspirin: Secondary | ICD-10-CM | POA: Insufficient documentation

## 2018-07-21 DIAGNOSIS — Z952 Presence of prosthetic heart valve: Secondary | ICD-10-CM | POA: Diagnosis not present

## 2018-07-21 DIAGNOSIS — R0609 Other forms of dyspnea: Secondary | ICD-10-CM

## 2018-07-21 HISTORY — PX: TEE WITHOUT CARDIOVERSION: SHX5443

## 2018-07-21 HISTORY — PX: RIGHT HEART CATH AND CORONARY/GRAFT ANGIOGRAPHY: CATH118265

## 2018-07-21 LAB — POCT I-STAT EG7
Acid-base deficit: 1 mmol/L (ref 0.0–2.0)
Acid-base deficit: 2 mmol/L (ref 0.0–2.0)
Acid-base deficit: 4 mmol/L — ABNORMAL HIGH (ref 0.0–2.0)
Bicarbonate: 23.7 mmol/L (ref 20.0–28.0)
Bicarbonate: 21.5 mmol/L (ref 20.0–28.0)
Bicarbonate: 23.2 mmol/L (ref 20.0–28.0)
Calcium, Ion: 1.2 mmol/L (ref 1.15–1.40)
Calcium, Ion: 1.06 mmol/L — ABNORMAL LOW (ref 1.15–1.40)
Calcium, Ion: 1.16 mmol/L (ref 1.15–1.40)
HCT: 37 % (ref 36.0–46.0)
HCT: 37 % (ref 36.0–46.0)
HCT: 38 % (ref 36.0–46.0)
Hemoglobin: 12.6 g/dL (ref 12.0–15.0)
Hemoglobin: 12.6 g/dL (ref 12.0–15.0)
Hemoglobin: 12.9 g/dL (ref 12.0–15.0)
O2 Saturation: 59 %
O2 Saturation: 53 %
O2 Saturation: 66 %
Potassium: 4.4 mmol/L (ref 3.5–5.1)
Potassium: 3.8 mmol/L (ref 3.5–5.1)
Potassium: 4.1 mmol/L (ref 3.5–5.1)
Sodium: 140 mmol/L (ref 135–145)
Sodium: 135 mmol/L (ref 135–145)
Sodium: 138 mmol/L (ref 135–145)
TCO2: 25 mmol/L (ref 22–32)
TCO2: 23 mmol/L (ref 22–32)
TCO2: 24 mmol/L (ref 22–32)
pCO2, Ven: 38.5 mmHg — ABNORMAL LOW (ref 44.0–60.0)
pCO2, Ven: 38.6 mmHg — ABNORMAL LOW (ref 44.0–60.0)
pCO2, Ven: 39.1 mmHg — ABNORMAL LOW (ref 44.0–60.0)
pH, Ven: 7.397 (ref 7.250–7.430)
pH, Ven: 7.349 (ref 7.250–7.430)
pH, Ven: 7.388 (ref 7.250–7.430)
pO2, Ven: 31 mmHg — CL (ref 32.0–45.0)
pO2, Ven: 29 mmHg — CL (ref 32.0–45.0)
pO2, Ven: 35 mmHg (ref 32.0–45.0)

## 2018-07-21 LAB — POCT I-STAT 7, (LYTES, BLD GAS, ICA,H+H)
Acid-base deficit: 2 mmol/L (ref 0.0–2.0)
Acid-base deficit: 2 mmol/L (ref 0.0–2.0)
Acid-base deficit: 2 mmol/L (ref 0.0–2.0)
Bicarbonate: 21.3 mmol/L (ref 20.0–28.0)
Bicarbonate: 21.6 mmol/L (ref 20.0–28.0)
Bicarbonate: 22.1 mmol/L (ref 20.0–28.0)
Calcium, Ion: 1.18 mmol/L (ref 1.15–1.40)
Calcium, Ion: 1.12 mmol/L — ABNORMAL LOW (ref 1.15–1.40)
Calcium, Ion: 1.14 mmol/L — ABNORMAL LOW (ref 1.15–1.40)
HCT: 37 % (ref 36.0–46.0)
HCT: 38 % (ref 36.0–46.0)
HCT: 38 % (ref 36.0–46.0)
Hemoglobin: 12.6 g/dL (ref 12.0–15.0)
Hemoglobin: 12.9 g/dL (ref 12.0–15.0)
Hemoglobin: 12.9 g/dL (ref 12.0–15.0)
O2 Saturation: 91 %
O2 Saturation: 92 %
O2 Saturation: 94 %
Potassium: 4.3 mmol/L (ref 3.5–5.1)
Potassium: 4.1 mmol/L (ref 3.5–5.1)
Potassium: 4.1 mmol/L (ref 3.5–5.1)
Sodium: 140 mmol/L (ref 135–145)
Sodium: 139 mmol/L (ref 135–145)
Sodium: 139 mmol/L (ref 135–145)
TCO2: 22 mmol/L (ref 22–32)
TCO2: 23 mmol/L (ref 22–32)
TCO2: 23 mmol/L (ref 22–32)
pCO2 arterial: 32.3 mmHg (ref 32.0–48.0)
pCO2 arterial: 33 mmHg (ref 32.0–48.0)
pCO2 arterial: 34.7 mmHg (ref 32.0–48.0)
pH, Arterial: 7.427 (ref 7.350–7.450)
pH, Arterial: 7.413 (ref 7.350–7.450)
pH, Arterial: 7.424 (ref 7.350–7.450)
pO2, Arterial: 57 mmHg — ABNORMAL LOW (ref 83.0–108.0)
pO2, Arterial: 61 mmHg — ABNORMAL LOW (ref 83.0–108.0)
pO2, Arterial: 68 mmHg — ABNORMAL LOW (ref 83.0–108.0)

## 2018-07-21 LAB — PROTIME-INR
INR: 1 (ref 0.8–1.2)
Prothrombin Time: 13.5 seconds (ref 11.4–15.2)

## 2018-07-21 SURGERY — RIGHT HEART CATH AND CORONARY/GRAFT ANGIOGRAPHY
Anesthesia: LOCAL

## 2018-07-21 SURGERY — ECHOCARDIOGRAM, TRANSESOPHAGEAL
Anesthesia: Monitor Anesthesia Care

## 2018-07-21 MED ORDER — ENOXAPARIN SODIUM 100 MG/ML ~~LOC~~ SOLN: 1.0000 mg/kg | Freq: Two times a day (BID) | SUBCUTANEOUS | Status: DC

## 2018-07-21 MED ORDER — VERAPAMIL HCL 2.5 MG/ML IV SOLN
INTRAVENOUS | Status: DC | PRN
  Administered 2018-07-21: 10 mL via INTRA_ARTERIAL

## 2018-07-21 MED ORDER — FUROSEMIDE 10 MG/ML IJ SOLN
INTRAMUSCULAR | Status: AC
  Filled 2018-07-21: qty 4

## 2018-07-21 MED ORDER — LIDOCAINE 2% (20 MG/ML) 5 ML SYRINGE
INTRAMUSCULAR | Status: DC | PRN
  Administered 2018-07-21: 60 mg via INTRAVENOUS

## 2018-07-21 MED ORDER — DIPHENHYDRAMINE HCL 50 MG/ML IJ SOLN
25.0000 mg | Freq: Once | INTRAMUSCULAR | Status: AC
  Administered 2018-07-21: 25 mg via INTRAVENOUS

## 2018-07-21 MED ORDER — METHYLPREDNISOLONE SODIUM SUCC 125 MG IJ SOLR
125.0000 mg | Freq: Once | INTRAMUSCULAR | Status: AC
  Administered 2018-07-21: 125 mg via INTRAVENOUS

## 2018-07-21 MED ORDER — SODIUM CHLORIDE 0.9% FLUSH: 3.0000 mL | INTRAVENOUS | Status: DC | PRN

## 2018-07-21 MED ORDER — PROPOFOL 10 MG/ML IV BOLUS
INTRAVENOUS | Status: DC | PRN
  Administered 2018-07-21: 20 mg via INTRAVENOUS
  Administered 2018-07-21: 30 mg via INTRAVENOUS

## 2018-07-21 MED ORDER — HYDRALAZINE HCL 20 MG/ML IJ SOLN: 10.0000 mg | INTRAMUSCULAR | Status: DC | PRN

## 2018-07-21 MED ORDER — ONDANSETRON HCL 4 MG/2ML IJ SOLN: 4.0000 mg | Freq: Four times a day (QID) | INTRAMUSCULAR | Status: DC | PRN

## 2018-07-21 MED ORDER — METHYLPREDNISOLONE SODIUM SUCC 125 MG IJ SOLR
INTRAMUSCULAR | Status: AC
  Filled 2018-07-21: qty 2

## 2018-07-21 MED ORDER — HEPARIN (PORCINE) IN NACL 1000-0.9 UT/500ML-% IV SOLN
INTRAVENOUS | Status: AC
  Filled 2018-07-21: qty 500

## 2018-07-21 MED ORDER — HEPARIN SODIUM (PORCINE) 1000 UNIT/ML IJ SOLN
INTRAMUSCULAR | Status: DC | PRN
  Administered 2018-07-21: 5000 [IU] via INTRAVENOUS

## 2018-07-21 MED ORDER — LABETALOL HCL 5 MG/ML IV SOLN: 10.0000 mg | INTRAVENOUS | Status: DC | PRN

## 2018-07-21 MED ORDER — EPHEDRINE SULFATE-NACL 50-0.9 MG/10ML-% IV SOSY
PREFILLED_SYRINGE | INTRAVENOUS | Status: DC | PRN
  Administered 2018-07-21 (×3): 10 mg via INTRAVENOUS

## 2018-07-21 MED ORDER — SODIUM CHLORIDE 0.9% FLUSH: 3.0000 mL | Freq: Two times a day (BID) | INTRAVENOUS | Status: DC

## 2018-07-21 MED ORDER — ASPIRIN 81 MG PO CHEW: 81.0000 mg | CHEWABLE_TABLET | ORAL | Status: DC

## 2018-07-21 MED ORDER — SODIUM CHLORIDE 0.9 % IV SOLN: 250.0000 mL | INTRAVENOUS | Status: DC | PRN

## 2018-07-21 MED ORDER — FENTANYL CITRATE (PF) 100 MCG/2ML IJ SOLN
INTRAMUSCULAR | Status: AC
  Filled 2018-07-21: qty 2

## 2018-07-21 MED ORDER — SPIRONOLACTONE 50 MG PO TABS: 50.0000 mg | ORAL_TABLET | Freq: Every day | ORAL | Status: DC

## 2018-07-21 MED ORDER — FENTANYL CITRATE (PF) 100 MCG/2ML IJ SOLN
INTRAMUSCULAR | Status: DC | PRN
  Administered 2018-07-21: 50 ug via INTRAVENOUS
  Administered 2018-07-21: 25 ug via INTRAVENOUS

## 2018-07-21 MED ORDER — FUROSEMIDE 20 MG PO TABS: 20.0000 mg | ORAL_TABLET | ORAL | 1 refills | Status: DC | PRN

## 2018-07-21 MED ORDER — HEPARIN SODIUM (PORCINE) 1000 UNIT/ML IJ SOLN
INTRAMUSCULAR | Status: AC
  Filled 2018-07-21: qty 1

## 2018-07-21 MED ORDER — WARFARIN SODIUM 7.5 MG PO TABS: 7.5000 mg | ORAL_TABLET | Freq: Every day | ORAL | Status: DC

## 2018-07-21 MED ORDER — MIDAZOLAM HCL 2 MG/2ML IJ SOLN
INTRAMUSCULAR | Status: AC
  Filled 2018-07-21: qty 2

## 2018-07-21 MED ORDER — HEPARIN (PORCINE) IN NACL 1000-0.9 UT/500ML-% IV SOLN
INTRAVENOUS | Status: DC | PRN
  Administered 2018-07-21 (×2): 500 mL

## 2018-07-21 MED ORDER — SPIRONOLACTONE 50 MG PO TABS: 50.0000 mg | ORAL_TABLET | Freq: Every day | ORAL | 3 refills | Status: DC

## 2018-07-21 MED ORDER — HEPARIN (PORCINE) IN NACL 1000-0.9 UT/500ML-% IV SOLN
INTRAVENOUS | Status: AC
  Filled 2018-07-21: qty 1000

## 2018-07-21 MED ORDER — LIDOCAINE HCL (PF) 1 % IJ SOLN
INTRAMUSCULAR | Status: AC
  Filled 2018-07-21: qty 30

## 2018-07-21 MED ORDER — LIDOCAINE HCL (PF) 1 % IJ SOLN
INTRAMUSCULAR | Status: DC | PRN
  Administered 2018-07-21 (×2): 2 mL via SUBCUTANEOUS

## 2018-07-21 MED ORDER — IOHEXOL 350 MG/ML SOLN
INTRAVENOUS | Status: DC | PRN
  Administered 2018-07-21: 130 mL via INTRA_ARTERIAL

## 2018-07-21 MED ORDER — VERAPAMIL HCL 2.5 MG/ML IV SOLN
INTRAVENOUS | Status: AC
  Filled 2018-07-21: qty 2

## 2018-07-21 MED ORDER — SODIUM CHLORIDE 0.9 % IV SOLN
INTRAVENOUS | Status: DC
  Administered 2018-07-21 (×2): via INTRAVENOUS

## 2018-07-21 MED ORDER — SODIUM CHLORIDE 0.9 % IV SOLN
INTRAVENOUS | Status: AC | PRN
  Administered 2018-07-21 (×2): 20 mL/h via INTRAVENOUS

## 2018-07-21 MED ORDER — FUROSEMIDE 10 MG/ML IJ SOLN
INTRAMUSCULAR | Status: DC | PRN
  Administered 2018-07-21 (×2): 40 mg via INTRAVENOUS

## 2018-07-21 MED ORDER — SODIUM CHLORIDE 0.9 % WEIGHT BASED INFUSION
3.0000 mL/kg/h | INTRAVENOUS | Status: AC
  Administered 2018-07-21: 3 mL/kg/h via INTRAVENOUS

## 2018-07-21 MED ORDER — MIDAZOLAM HCL 2 MG/2ML IJ SOLN
INTRAMUSCULAR | Status: DC | PRN
  Administered 2018-07-21 (×2): 1 mg via INTRAVENOUS

## 2018-07-21 MED ORDER — ACETAMINOPHEN 325 MG PO TABS: 650.0000 mg | ORAL_TABLET | ORAL | Status: DC | PRN

## 2018-07-21 MED ORDER — SODIUM CHLORIDE 0.9 % WEIGHT BASED INFUSION: 1.0000 mL/kg/h | INTRAVENOUS | Status: DC

## 2018-07-21 MED ORDER — PROPOFOL 500 MG/50ML IV EMUL
INTRAVENOUS | Status: DC | PRN
  Administered 2018-07-21: 75 ug/kg/min via INTRAVENOUS

## 2018-07-21 MED ORDER — DIPHENHYDRAMINE HCL 50 MG/ML IJ SOLN
INTRAMUSCULAR | Status: AC
  Filled 2018-07-21: qty 1

## 2018-07-21 SURGICAL SUPPLY — 15 items
CATH BALLN WEDGE 5F 110CM (CATHETERS) ×1 IMPLANT
CATH INFINITI 5FR MULTPACK ANG (CATHETERS) ×1 IMPLANT
CATH OPTITORQUE TIG 4.0 5F (CATHETERS) ×1 IMPLANT
DEVICE RAD COMP TR BAND LRG (VASCULAR PRODUCTS) ×1 IMPLANT
GLIDESHEATH SLEND A-KIT 6F 22G (SHEATH) ×1 IMPLANT
GLIDESHEATH SLEND SS 6F .021 (SHEATH) ×1 IMPLANT
GUIDEWIRE INQWIRE 1.5J.035X260 (WIRE) IMPLANT
INQWIRE 1.5J .035X260CM (WIRE) ×2
KIT HEART LEFT (KITS) ×2 IMPLANT
PACK CARDIAC CATHETERIZATION (CUSTOM PROCEDURE TRAY) ×2 IMPLANT
SHEATH GLIDE SLENDER 4/5FR (SHEATH) ×1 IMPLANT
SHEATH PROBE COVER 6X72 (BAG) ×1 IMPLANT
SYR MEDRAD MARK 7 150ML (SYRINGE) ×2 IMPLANT
TRANSDUCER W/STOPCOCK (MISCELLANEOUS) ×2 IMPLANT
TUBING CIL FLEX 10 FLL-RA (TUBING) ×2 IMPLANT

## 2018-07-21 NOTE — Anesthesia Preprocedure Evaluation (Addendum)
Anesthesia Evaluation  Patient identified by MRN, date of birth, ID band Patient awake    Reviewed: Allergy & Precautions, NPO status , Patient's Chart, lab work & pertinent test results, reviewed documented beta blocker date and time   Airway Mallampati: II  TM Distance: >3 FB Neck ROM: Full    Dental  (+) Upper Dentures   Pulmonary former smoker,    breath sounds clear to auscultation       Cardiovascular hypertension, Pt. on medications and Pt. on home beta blockers + Valvular Problems/Murmurs AS  Rhythm:Regular Rate:Normal     Neuro/Psych negative neurological ROS     GI/Hepatic negative GI ROS, Neg liver ROS,   Endo/Other  negative endocrine ROS  Renal/GU negative Renal ROS     Musculoskeletal negative musculoskeletal ROS (+)   Abdominal Normal abdominal exam  (+)   Peds  Hematology negative hematology ROS (+)   Anesthesia Other Findings   Reproductive/Obstetrics                            Anesthesia Physical Anesthesia Plan  ASA: III  Anesthesia Plan: MAC   Post-op Pain Management:    Induction: Intravenous  PONV Risk Score and Plan: Propofol infusion and Ondansetron  Airway Management Planned: Natural Airway and Nasal Cannula  Additional Equipment: None  Intra-op Plan:   Post-operative Plan:   Informed Consent: I have reviewed the patients History and Physical, chart, labs and discussed the procedure including the risks, benefits and alternatives for the proposed anesthesia with the patient or authorized representative who has indicated his/her understanding and acceptance.       Plan Discussed with: CRNA  Anesthesia Plan Comments:        Anesthesia Quick Evaluation

## 2018-07-21 NOTE — H&P (Signed)
Subjective:   Kimberly Hobbs, female    DOB: 02-16-1961, 58 y.o.   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  Patient has continued to have exertional chest pressure symptoms. She has to stop after walking 1 min in treadmill due to these symptoms.  On a separate note, patient's INR was noted 7.5 on 0/26, thus warfarin was held. INR today is 1.1  Past Medical History:  Diagnosis Date   Hyperlipidemia    Hypertension    Rheumatic heart disease    MS/ AS     Past Surgical History:  Procedure Laterality Date   CHOLECYSTECTOMY     LEG SURGERY     "metal plate in leg"   TEE WITHOUT CARDIOVERSION N/A 07/27/2015   Procedure: TRANSESOPHAGEAL ECHOCARDIOGRAM (TEE);  Surgeon: Sanda Klein, MD;  Location: [facility] ENDOSCOPY;  Service: Cardiovascular;  Laterality: N/A;   TONSILLECTOMY AND ADENOIDECTOMY       Social History   Socioeconomic History   Marital status: Married    Spouse name: Not on file   Number of children: 4   Years of education: Not on file   Highest education level: Not on file  Occupational History   Not on file  Social Needs   Financial resource strain: Not on file   Food insecurity:    Worry: Not on file    Inability: Not on file   Transportation needs:    Medical: Not on file    Non-medical: Not on file  Tobacco Use   Smoking status: Former Smoker    Packs/day: 1.50    Years: 40.00    Pack years: 60.00    Types: Cigarettes    Last attempt to quit: 06/04/2017    Years since quitting: 1.1   Smokeless tobacco: Never Used  Substance and Sexual Activity   Alcohol use: Yes   Drug use: No   Sexual activity: Not on file  Lifestyle   Physical activity:     Days per week: Not on file    Minutes per session: Not on file   Stress: Not on file  Relationships   Social connections:    Talks on phone: Not on file    Gets together: Not on file    Attends religious service: Not on file    Active member of club or organization: Not on file    Attends meetings of clubs or organizations: Not on file    Relationship status: Not on file   Intimate partner violence:    Fear of current or ex partner: Not on file    Emotionally abused: Not on file    Physically abused: Not on file    Forced sexual activity: Not on file  Other Topics Concern   Not on file  Social History Narrative   Epworth Sleepiness Scale = 4 (as of 06/28/2015)     Family History  Problem Relation Age of Onset   Cancer Mother    Hypertension Mother    Diabetes Mother    Hyperlipidemia Mother    Heart disease Mother    Cancer Father    Hypertension Father    Diabetes Father    Heart disease Father    Hypertension Sister  Hypertension Brother    Hypertension Brother      Current Outpatient Medications on File Prior to Visit  Medication Sig Dispense Refill   albuterol (VENTOLIN HFA) 108 (90 Base) MCG/ACT inhaler as needed.     aspirin EC 81 MG tablet Take by mouth daily.     citalopram (CELEXA) 20 MG tablet Take 20 mg by mouth 2 (two) times daily.      clonazePAM (KLONOPIN) 0.5 MG tablet Take 0.5 mg by mouth daily.      enoxaparin (LOVENOX) 100 MG/ML injection Inject 1.05 mLs (105 mg total) into the skin every 12 (twelve) hours. 30 Syringe 0   fluticasone (FLOVENT DISKUS) 50 MCG/BLIST diskus inhaler 2 (two) times daily.     furosemide (LASIX) 20 MG tablet Take 1 tablet (20 mg total) by mouth daily. 30 tablet 1   isosorbide mononitrate (IMDUR) 60 MG 24 hr tablet Take 1 tablet (60 mg total) by mouth daily. 90 tablet 3   losartan (COZAAR) 50 MG tablet TAKE 1 TABLET BY MOUTH EVERY DAY 30 tablet 6   metoprolol succinate (TOPROL-XL) 100 MG 24 hr  tablet TAKE 1 TABLET BY MOUTH TWICE A DAY 60 tablet 6   nitroGLYCERIN (NITROSTAT) 0.4 MG SL tablet as needed.     rosuvastatin (CRESTOR) 40 MG tablet Take 40 mg by mouth daily.      warfarin (COUMADIN) 5 MG tablet Take 2 tablets (10 mg total) by mouth daily at 6 PM. Mon, fri 60 tablet 2   No current facility-administered medications on file prior to visit.     Cardiovascular studies:  EKG 02/19/2018: Probable sinus rhythm 76 bpm. Poor R wave progression  Lexiscan sestamibi stress test 02/27/2018: 1. Lexiscan stress test with low level exercise was performed. Exercise capacity was not assessed. No stress symptoms reported. Peak effect blood pressure was 220/86 mmHg. The resting electrocardiogram demonstrated normal sinus rhythm, normal resting conduction, no resting arrhythmias and normal rest repolarization.  Stress EKG is non diagnostic for ischemia as it is a pharmacologic stress. In addition, the stress electrocardiogram showed sinus tachycardia, normal stress conduction, occasional PVC, and normal stress repolarization.   2. The overall quality of the study is good.  Left ventricular cavity is noted to be normal on the rest and stress studies.  Gated SPECT images reveal normal myocardial thickening and wall motion.  The left ventricular ejection fraction was calculated or visually estimated to be 61%. Medium sized, mild intensity, partially reversible perfusion defect in mid to apical  inferior/inferolateral myocardium. 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  3. Intermediate risk study.  Echocardiogram 02/26/2018: Left ventricle cavity is normal in size. Mild concentric hypertrophy of the left ventricle. Abnormal septal wall motion due to post-operative valve. Diastolic function could not be assessed due to post op valve and CABG status.  Calculated EF 63%. Left atrial cavity is moderate to severely dilated measures 4.5 cm in long axis. Right atrial  cavity is mildly dilated. Mechanical aortic valve with trace regurgitation. Mild aortic valve leaflet calcification. Mildly restricted aortic valve leaflets. Mild to moderate aortic valve stenosis. Aortic valve peak pressure gradient of  43 and mean gradient of 21 mmHg, calculated aortic valve area    0.88 cm. Mechanical mitral valve with trace regurgitation. Moderately restricted mitral valve leaflets. Mild mitral valve stenosis. Mitral valve peak pressure gradient of  26  and mean gradient of  6.2  mmHg, calculated mitral valve area 1.9   cm. Mild to  moderate tricuspid regurgitation. Mild pulmonary hypertension. PA systolic pressure estimated at 39 mm Hg. Compared to the study done on 08/06/2017, no significant change.  Echocardiogram 08/06/2017: Left ventricle cavity is normal in size. Moderate concentric hypertrophy of the left ventricle. Normal global wall motion. Indeterminate diastolic filling pattern. Calculated EF 63%. Mechanical aortic valve with trace regurgitation. Mild pannus formation on the mechanical AV. Mildly restricted aortic valve leaflets. Mild to moderate aortic valve stenosis. Aortic valve peak pressure gradient of 41 and mean gradient of 22 mmHg, calculated aortic valve area   1.0 cm. Mechanical mitral valve with trace regurgitation. Mild pannus on the mechanical MV. Mildly restricted mitral valve leaflets. Mitral valve peak pressure gradient of 14 and mean gradient of 4 mmHg, calculated mitral valve area 1.8 cm. Mild to moderate tricuspid regurgitation. Mild pulmonary hypertension.   Recent labs: Labs 02/16/2018: BNP 251 elevated  Labs 07/02/2017: A1c 5.7%, HB 12.9/HCT 38.6, platelets 220, normal indicis.  Serum glucose 90 mg, BUN 23, creatinine 0.75, CMP normal.  Potassium 4.6.  TSH normal.  Review of Systems  Constitution: Negative for decreased appetite, malaise/fatigue, weight gain and weight loss.  HENT: Negative for congestion.   Eyes: Negative for visual  disturbance.  Cardiovascular: Positive for chest pain and dyspnea on exertion. Negative for leg swelling, palpitations and syncope.  Respiratory: Positive for shortness of breath.   Endocrine: Negative for cold intolerance.  Hematologic/Lymphatic: Does not bruise/bleed easily.  Skin: Negative for itching and rash.  Musculoskeletal: Negative for myalgias.  Gastrointestinal: Negative for abdominal pain, nausea and vomiting.  Genitourinary: Negative for dysuria.  Neurological: Negative for dizziness and weakness.  Psychiatric/Behavioral: The patient is not nervous/anxious.   All other systems reviewed and are negative.        Vitals:   07/17/18 1615  BP: (!) 160/80  Pulse: (!) 56  SpO2: 96%   (Measured by the patient using a home BP monitor)   Objective:     Physical Exam  Constitutional: She is oriented to person, place, and time. She appears well-developed and well-nourished. No distress.  HENT:  Head: Normocephalic and atraumatic.  Eyes: Pupils are equal, round, and reactive to light. Conjunctivae are normal.  Neck: No JVD present.  Cardiovascular: Normal rate, regular rhythm and intact distal pulses.  Murmur heard.  Harsh midsystolic murmur is present with a grade of 2/6 at the upper right sternal border radiating to the neck. Crisp metallic S1 & S2. No diastolic murmur heard.  Pulmonary/Chest: Effort normal and breath sounds normal. She has no wheezes. She has no rales.  Abdominal: Soft. Bowel sounds are normal. There is no rebound.  Musculoskeletal:        General: No edema.  Lymphadenopathy:    She has no cervical adenopathy.  Neurological: She is alert and oriented to person, place, and time. No cranial nerve deficit.  Skin: Skin is warm and dry.  Psychiatric: She has a normal mood and affect.  Nursing note and vitals reviewed.        Assessment & Recommendations:   58 year old Caucasian female with coronary artery disease and rheumatic mitral and aortic  valve stenosis status post CABG (LIMA-LAD, SVG-pPDA, mechnical mitral and aortic valve replacement at [facility] in 12/2016, h/o post op Afib, was seen for acute visit for chest pain and shotness of breath on 02/19/2018.  Exertional chest pain, dyspnea: Coronary artery disease s/p CABG (LIMA-LAD, SVG-PDA) 2018 Intermediate risk stress test with LCx region mild peri infarct ischemia. Transthoracic echocardiogram has showed modest aortic valve  area of 1.0 cm2 with mean PG 14 mmHg, mean mitral valve PG 4 mmHg. Given persistent symptoms, I will proceed with coronary and bypass graft angiography, right heart catheterization and TEE for definitive evaluation. Given her subtherapeutic INR, I am also concerned about her mechanical valves, although I do hear metallic S1 & S2. Fluoroscopy and TEE will further help assess this.    S/p MVR, AVR, subtherapeutic INR: I suspect INR on 07/14/2018 was erroneous. She is back to 1.1. Resume lovenox. Resume warfarin 7. Mg daily after cath Prosthetic valves well functioning. INR goal 2.5-3.5. Continue aspirin 81 mg.  Hypertension: Subotpimal control.  Will adjust therapy after cath.   Return visit in 3 months   Boyce, MD [facility] Pager: [phone] Office: [phone] If no answer Cell [phone]

## 2018-07-21 NOTE — Interval H&P Note (Signed)
History and Physical Interval Note:  07/21/2018 2:50 PM  Kimberly Hobbs  has presented today for surgery, with the diagnosis of Shortness of Breath; Possitve Stress Test.  The various methods of treatment have been discussed with the patient and family. After consideration of risks, benefits and other options for treatment, the patient has consented to  Procedure(s): RIGHT/LEFT HEART CATH AND CORONARY/GRAFT ANGIOGRAPHY (N/A) as a surgical intervention.  The patient's history has been reviewed, patient examined, no change in status, stable for surgery.  I have reviewed the patient's chart and labs.  Questions were answered to the patient's satisfaction.    2016/2017 Appropriate Use Criteria for Coronary Revascularization Clinical Presentation: Diabetes Mellitus? Symptom Status? S/P CABG? Antianginal Therapy (# of long-acting drugs)? Results of Non-invasive testing? FFR/iFR results in all diseased vessels? Patient undergoing renal transplant? Patient undergoing percutaneous valve procedure (TAVR, MitraClip, Others)? Symptom Status:  Ischemic Symptoms  Non-invasive Testing:  Intermediate Risk  If no or indeterminate stress test, FFR/iFR results in all diseased vessels:  N/A  Diabetes Mellitus:  No  S/P CABG:  Yes  Antianginal therapy (number of long-acting drugs):  >=2  Patient undergoing renal transplant:  No  Patient undergoing percutaneous valve procedure:  No     LIMA-LAD patent and without significant stenoses PCI CABG  Stenosis supplying 1 territory (bypass graft or native artery) other than anterior A (8); Indication 53 M (5); Indication 31   Stenoses supplying 2 territories (bypass graft or native artery, either 2 separate vessels or sequential graft supplying 2 territories) not including anterior territory A (8); Indication 39 M (6); Indication 34   newline LIMA-LAD not patent  Stenosis supplying 1 territory (bypass graft or native artery)-anterior (LAD) territory A (8); Indication  39 M (6); Indication 36   Stenoses supplying 2 territories (bypass graft or native artery, either 2 separate vessels or sequential graft supplying 2 territories)-LAD plus other territory A (8); Indication 39 A (8); Indication 39   Stenoses supplying 3 territories (bypass graft or native arteries, separate vessels, sequential grafts, or combination thereof)-LAD plus 2 other territories A (8); Indication 41 A (8); Indication 41     2012 Appropriate Use Criteria for Diagnostic Catheterization Home / Select Test of Interest Indication for RHC Pulmonary Hypertension Pulmonary Hypertension (Right Heart Catheterization) Pulmonary Hypertension  (Right Heart Catheterization) Link Here: [email or web address] Indication:  1. Suspected pulmonary hypertension 2. Elevated estimated right ventricular systolic pressure on resting echo study A (7) Indication: 98; Score 7     Jourden Gilson J Florella Mcneese

## 2018-07-21 NOTE — Anesthesia Procedure Notes (Signed)
Date/Time: 07/21/2018 1:00 PM Performed by: Trinna Post., CRNA Pre-anesthesia Checklist: Patient identified, Emergency Drugs available, Suction available, Patient being monitored and Timeout performed Patient Re-evaluated:Patient Re-evaluated prior to induction Oxygen Delivery Method: Nasal cannula Preoxygenation: Pre-oxygenation with 100% oxygen Induction Type: IV induction Placement Confirmation: positive ETCO2

## 2018-07-21 NOTE — Interval H&P Note (Signed)
History and Physical Interval Note:  07/21/2018 12:57 PM  Kimberly Hobbs  has presented today for surgery, with the diagnosis of SHORTNESS OF BREATHER, ARORTIC AND MICROVALVE  REPLACEMENT.  The various methods of treatment have been discussed with the patient and family. After consideration of risks, benefits and other options for treatment, the patient has consented to  Procedure(s): TRANSESOPHAGEAL ECHOCARDIOGRAM (TEE) (N/A) as a surgical intervention.  The patient's history has been reviewed, patient examined, no change in status, stable for surgery.  I have reviewed the patient's chart and labs.  Questions were answered to the patient's satisfaction.     [name]

## 2018-07-21 NOTE — Addendum Note (Signed)
Addended by: Nigel Mormon on: 07/21/2018 06:27 PM   Modules accepted: Orders

## 2018-07-21 NOTE — Anesthesia Postprocedure Evaluation (Signed)
Anesthesia Post Note  Patient: ACHAIA GARLOCK  Procedure(s) Performed: TRANSESOPHAGEAL ECHOCARDIOGRAM (TEE) (N/A )     Patient location during evaluation: PACU Anesthesia Type: MAC Level of consciousness: awake and alert Pain management: pain level controlled Vital Signs Assessment: post-procedure vital signs reviewed and stable Respiratory status: spontaneous breathing, nonlabored ventilation, respiratory function stable and patient connected to nasal cannula oxygen Cardiovascular status: stable and blood pressure returned to baseline Postop Assessment: no apparent nausea or vomiting Anesthetic complications: no    Last Vitals:  Vitals:   07/21/18 1331 07/21/18 1340  BP: (!) 120/45 (!) 125/38  Pulse: 66 64  Resp: 19   Temp: 36.9 C   SpO2: 96% 96%    Last Pain:  Vitals:   07/21/18 1340  TempSrc:   PainSc: 0-No pain                 Effie Berkshire

## 2018-07-21 NOTE — Progress Notes (Signed)
°  Echocardiogram Echocardiogram Transesophageal has been performed.  Kimberly Hobbs 07/21/2018, 1:37 PM

## 2018-07-21 NOTE — Transfer of Care (Signed)
Immediate Anesthesia Transfer of Care Note  Patient: Kimberly Hobbs  Procedure(s) Performed: TRANSESOPHAGEAL ECHOCARDIOGRAM (TEE) (N/A )  Patient Location: PACU and Endoscopy Unit  Anesthesia Type:MAC  Level of Consciousness: awake, alert  and oriented  Airway & Oxygen Therapy: Patient Spontanous Breathing  Post-op Assessment: Report given to RN and Post -op Vital signs reviewed and stable  Post vital signs: Reviewed and stable  Last Vitals:  Vitals Value Taken Time  BP 120/45 07/21/2018  1:30 PM  Temp    Pulse 65 07/21/2018  1:31 PM  Resp 19 07/21/2018  1:31 PM  SpO2 95 % 07/21/2018  1:31 PM  Vitals shown include unvalidated device data.  Last Pain:  Vitals:   07/21/18 1149  TempSrc: Temporal  PainSc: 0-No pain         Complications: No apparent anesthesia complications

## 2018-07-21 NOTE — Discharge Instructions (Signed)
Radial Site Care ° °This sheet gives you information about how to care for yourself after your procedure. Your health care provider may also give you more specific instructions. If you have problems or questions, contact your health care provider. °What can I expect after the procedure? °After the procedure, it is common to have: °· Bruising and tenderness at the catheter insertion area. °Follow these instructions at home: °Medicines °· Take over-the-counter and prescription medicines only as told by your health care provider. °Insertion site care °· Follow instructions from your health care provider about how to take care of your insertion site. Make sure you: °? Wash your hands with soap and water before you change your bandage (dressing). If soap and water are not available, use hand sanitizer. °? Change your dressing as told by your health care provider. °? Leave stitches (sutures), skin glue, or adhesive strips in place. These skin closures may need to stay in place for 2 weeks or longer. If adhesive strip edges start to loosen and curl up, you may trim the loose edges. Do not remove adhesive strips completely unless your health care provider tells you to do that. °· Check your insertion site every day for signs of infection. Check for: °? Redness, swelling, or pain. °? Fluid or blood. °? Pus or a bad smell. °? Warmth. °· Do not take baths, swim, or use a hot tub until your health care provider approves. °· You may shower 24-48 hours after the procedure, or as directed by your health care provider. °? Remove the dressing and gently wash the site with plain soap and water. °? Pat the area dry with a clean towel. °? Do not rub the site. That could cause bleeding. °· Do not apply powder or lotion to the site. °Activity ° °· For 24 hours after the procedure, or as directed by your health care provider: °? Do not flex or bend the affected arm. °? Do not push or pull heavy objects with the affected arm. °? Do not  drive yourself home from the hospital or clinic. You may drive 24 hours after the procedure unless your health care provider tells you not to. °? Do not operate machinery or power tools. °· Do not lift anything that is heavier than 10 lb (4.5 kg), or the limit that you are told, until your health care provider says that it is safe. °· Ask your health care provider when it is okay to: °? Return to work or school. °? Resume usual physical activities or sports. °? Resume sexual activity. °General instructions °· If the catheter site starts to bleed, raise your arm and put firm pressure on the site. If the bleeding does not stop, get help right away. This is a medical emergency. °· If you went home on the same day as your procedure, a responsible adult should be with you for the first 24 hours after you arrive home. °· Keep all follow-up visits as told by your health care provider. This is important. °Contact a health care provider if: °· You have a fever. °· You have redness, swelling, or yellow drainage around your insertion site. °Get help right away if: °· You have unusual pain at the radial site. °· The catheter insertion area swells very fast. °· The insertion area is bleeding, and the bleeding does not stop when you hold steady pressure on the area. °· Your arm or hand becomes pale, cool, tingly, or numb. °These symptoms may represent a serious problem   that is an emergency. Do not wait to see if the symptoms will go away. Get medical help right away. Call your local emergency services (911 in the [place] Do not drive yourself to the hospital. °Summary °· After the procedure, it is common to have bruising and tenderness at the site. °· Follow instructions from your health care provider about how to take care of your radial site wound. Check the wound every day for signs of infection. °· Do not lift anything that is heavier than 10 lb (4.5 kg), or the limit that you are told, until your health care provider says  that it is safe. °This information is not intended to replace advice given to you by your health care provider. Make sure you discuss any questions you have with your health care provider. °Document Released: 03/09/2010 Document Revised: 03/12/2017 Document Reviewed: 03/12/2017 °Elsevier Interactive Patient Education © 2019 [employer] ° °

## 2018-07-22 ENCOUNTER — Encounter (HOSPITAL_COMMUNITY): Payer: Self-pay | Admitting: Cardiology

## 2018-07-23 MED FILL — Furosemide Inj 10 MG/ML: INTRAMUSCULAR | Qty: 4 | Status: AC

## 2018-07-24 ENCOUNTER — Other Ambulatory Visit: Payer: Self-pay

## 2018-07-24 ENCOUNTER — Other Ambulatory Visit (HOSPITAL_COMMUNITY)
Admission: RE | Admit: 2018-07-24 | Discharge: 2018-07-24 | Disposition: A | Discharge status: Home / Self Care | Payer: 59 | Source: Ambulatory Visit | Attending: Cardiology | Admitting: Cardiology

## 2018-07-24 DIAGNOSIS — Z1159 Encounter for screening for other viral diseases: Secondary | ICD-10-CM | POA: Insufficient documentation

## 2018-07-24 DIAGNOSIS — I209 Angina pectoris, unspecified: Secondary | ICD-10-CM

## 2018-07-24 DIAGNOSIS — Z01812 Encounter for preprocedural laboratory examination: Secondary | ICD-10-CM | POA: Diagnosis present

## 2018-07-25 LAB — BASIC METABOLIC PANEL
BUN/Creatinine Ratio: 34 — ABNORMAL HIGH (ref 9–23)
BUN: 23 mg/dL (ref 6–24)
CO2: 21 mmol/L (ref 20–29)
Calcium: 9.9 mg/dL (ref 8.7–10.2)
Chloride: 102 mmol/L (ref 96–106)
Creatinine, Ser: 0.67 mg/dL (ref 0.57–1.00)
GFR calc Af Amer: 114 mL/min/{1.73_m2} (ref >59)
GFR calc non Af Amer: 99 mL/min/{1.73_m2} (ref >59)
Glucose: 104 mg/dL — ABNORMAL HIGH (ref 65–99)
Potassium: 4.7 mmol/L (ref 3.5–5.2)
Sodium: 140 mmol/L (ref 134–144)

## 2018-07-25 LAB — NOVEL CORONAVIRUS, NAA (HOSP ORDER, SEND-OUT TO REF LAB; TAT 18-24 HRS): SARS-CoV-2, NAA: NOT DETECTED

## 2018-07-27 ENCOUNTER — Ambulatory Visit: Payer: 59 | Admitting: Cardiology

## 2018-07-27 NOTE — H&P (Addendum)
Subjective:   Kimberly Hobbs, female    DOB: October 14, 1960, 58 y.o.   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  TEE on 06/02 showed normal functioning of valves. LHC/RHC/Coronary and bypass graft angiography showed pulmonary hypertension, elevated filling pressures and pulmonary hypertension. She did have pRCA and SVG-RCA. She is here for elective PCI given persistent symptoms.        Past Medical History:  Diagnosis Date   Hyperlipidemia    Hypertension    Rheumatic heart disease    MS/ AS          Past Surgical History:  Procedure Laterality Date   CHOLECYSTECTOMY     LEG SURGERY     "metal plate in leg"   TEE WITHOUT CARDIOVERSION N/A 07/27/2015   Procedure: TRANSESOPHAGEAL ECHOCARDIOGRAM (TEE);  Surgeon: Sanda Klein, MD;  Location: [facility] ENDOSCOPY;  Service: Cardiovascular;  Laterality: N/A;   TONSILLECTOMY AND ADENOIDECTOMY       Social History        Socioeconomic History   Marital status: Married    Spouse name: Not on file   Number of children: 4   Years of education: Not on file   Highest education level: Not on file  Occupational History   Not on file  Social Needs   Financial resource strain: Not on file   Food insecurity:    Worry: Not on file    Inability: Not on file   Transportation needs:    Medical: Not on file    Non-medical: Not on file  Tobacco Use   Smoking status: Former Smoker    Packs/day: 1.50    Years: 40.00    Pack years: 60.00    Types: Cigarettes    Last attempt to quit: 06/04/2017    Years since quitting: 1.1   Smokeless tobacco: Never Used  Substance and Sexual Activity   Alcohol use: Yes   Drug use: No   Sexual  activity: Not on file  Lifestyle   Physical activity:    Days per week: Not on file    Minutes per session: Not on file   Stress: Not on file  Relationships   Social connections:    Talks on phone: Not on file    Gets together: Not on file    Attends religious service: Not on file    Active member of club or organization: Not on file    Attends meetings of clubs or organizations: Not on file    Relationship status: Not on file   Intimate partner violence:    Fear of current or ex partner: Not on file    Emotionally abused: Not on file    Physically abused: Not on file    Forced sexual activity: Not on file  Other Topics Concern   Not on file  Social History Narrative   Epworth Sleepiness Scale = 4 (as of 06/28/2015)          Family History  Problem Relation Age of Onset   Cancer Mother    Hypertension Mother    Diabetes Mother    Hyperlipidemia Mother    Heart disease Mother    Cancer Father    Hypertension Father    Diabetes Father    Heart disease  Father    Hypertension Sister    Hypertension Brother    Hypertension Brother            Current Outpatient Medications on File Prior to Visit  Medication Sig Dispense Refill   albuterol (VENTOLIN HFA) 108 (90 Base) MCG/ACT inhaler as needed.     aspirin EC 81 MG tablet Take by mouth daily.     citalopram (CELEXA) 20 MG tablet Take 20 mg by mouth 2 (two) times daily.      clonazePAM (KLONOPIN) 0.5 MG tablet Take 0.5 mg by mouth daily.      enoxaparin (LOVENOX) 100 MG/ML injection Inject 1.05 mLs (105 mg total) into the skin every 12 (twelve) hours. 30 Syringe 0   fluticasone (FLOVENT DISKUS) 50 MCG/BLIST diskus inhaler 2 (two) times daily.     furosemide (LASIX) 20 MG tablet Take 1 tablet (20 mg total) by mouth daily. 30 tablet 1   isosorbide mononitrate (IMDUR) 60 MG 24 hr tablet Take 1 tablet (60 mg total) by mouth daily. 90 tablet 3    losartan (COZAAR) 50 MG tablet TAKE 1 TABLET BY MOUTH EVERY DAY 30 tablet 6   metoprolol succinate (TOPROL-XL) 100 MG 24 hr tablet TAKE 1 TABLET BY MOUTH TWICE A DAY 60 tablet 6   nitroGLYCERIN (NITROSTAT) 0.4 MG SL tablet as needed.     rosuvastatin (CRESTOR) 40 MG tablet Take 40 mg by mouth daily.      warfarin (COUMADIN) 5 MG tablet Take 2 tablets (10 mg total) by mouth daily at 6 PM. Mon, fri 60 tablet 2   No current facility-administered medications on file prior to visit.     Cardiovascular studies:  EKG 02/19/2018: Probable sinus rhythm 76 bpm. Poor R wave progression  Lexiscan sestamibi stress test 02/27/2018: 1. Lexiscan stress test with low level exercise was performed. Exercise capacity was not assessed. No stress symptoms reported. Peak effect blood pressure was 220/86 mmHg. The resting electrocardiogram demonstrated normal sinus rhythm, normal resting conduction, no resting arrhythmias and normal rest repolarization. Stress EKG is non diagnostic for ischemia as it is a pharmacologic stress. In addition, the stress electrocardiogram showed sinus tachycardia, normal stress conduction, occasional PVC, and normal stress repolarization.  2. The overall quality of the study is good. Left ventricular cavity is noted to be normal on the rest and stress studies. Gated SPECT images reveal normal myocardial thickening and wall motion. The left ventricular ejection fraction was calculated or visually estimated to be 61%. Medium sized, mild intensity, partially reversible perfusion defect in mid to apical inferior/inferolateral myocardium. 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  3. Intermediate risk study.  Echocardiogram 02/26/2018: Left ventricle cavity is normal in size. Mild concentric hypertrophy of the left ventricle. Abnormal septal wall motion due to post-operative valve. Diastolic function could not be assessed due to post op  valve and CABG status. Calculated EF 63%. Left atrial cavity is moderate to severely dilated measures 4.5 cm in long axis. Right atrial cavity is mildly dilated. Mechanical aortic valve with trace regurgitation. Mild aortic valve leaflet calcification. Mildly restricted aortic valve leaflets. Mild to moderate aortic valve stenosis. Aortic valve peak pressure gradient of 43 and mean gradient of 21 mmHg, calculated aortic valve area 0.88 cm. Mechanical mitral valve with trace regurgitation. Moderately restricted mitral valve leaflets. Mild mitral valve stenosis. Mitral valve peak pressure gradient of 26 and mean gradient of 6.2 mmHg, calculated mitral valve area 1.9 cm. Mild to moderate tricuspid  regurgitation. Mild pulmonary hypertension. PA systolic pressure estimated at 39 mm Hg. Compared to the study done on 08/06/2017, no significant change.  Echocardiogram 08/06/2017: Left ventricle cavity is normal in size. Moderate concentric hypertrophy of the left ventricle. Normal global wall motion. Indeterminate diastolic filling pattern. Calculated EF 63%. Mechanical aortic valve with trace regurgitation. Mild pannus formation on the mechanical AV. Mildly restricted aortic valve leaflets. Mild to moderate aortic valve stenosis. Aortic valve peak pressure gradient of 41 and mean gradient of 22 mmHg, calculated aortic valve area 1.0 cm. Mechanical mitral valve with trace regurgitation. Mild pannus on the mechanical MV. Mildly restricted mitral valve leaflets. Mitral valve peak pressure gradient of 14 and mean gradient of 4 mmHg, calculated mitral valve area 1.8 cm. Mild to moderate tricuspid regurgitation. Mild pulmonary hypertension.   Recent labs: Labs 02/16/2018: BNP 251 elevated  Labs 07/02/2017: A1c 5.7%, HB 12.9/HCT 38.6, platelets 220, normal indicis.  Serum glucose 90 mg, BUN 23, creatinine 0.75, CMP normal.  Potassium 4.6.  TSH normal.  Review of Systems  Constitution:  Negative for decreased appetite, malaise/fatigue, weight gain and weight loss.  HENT: Negative for congestion.   Eyes: Negative for visual disturbance.  Cardiovascular: Positive for chest pain and dyspnea on exertion. Negative for leg swelling, palpitations and syncope.  Respiratory: Positive for shortness of breath.   Endocrine: Negative for cold intolerance.  Hematologic/Lymphatic: Does not bruise/bleed easily.  Skin: Negative for itching and rash.  Musculoskeletal: Negative for myalgias.  Gastrointestinal: Negative for abdominal pain, nausea and vomiting.  Genitourinary: Negative for dysuria.  Neurological: Negative for dizziness and weakness.  Psychiatric/Behavioral: The patient is not nervous/anxious.   All other systems reviewed and are negative.      Vitals pending.   Objective:     Physical Exam  Constitutional: She is oriented to person, place, and time. She appears well-developed and well-nourished. No distress.  HENT:  Head: Normocephalic and atraumatic.  Eyes: Pupils are equal, round, and reactive to light. Conjunctivae are normal.  Neck: No JVD present.  Cardiovascular: Normal rate, regular rhythm and intact distal pulses.  Murmur heard.  Harsh midsystolic murmur is present with a grade of 2/6 at the upper right sternal border radiating to the neck. Crisp metallic S1 & S2. No diastolic murmur heard.  Pulmonary/Chest: Effort normal and breath sounds normal. She has no wheezes. She has no rales.  Abdominal: Soft. Bowel sounds are normal. There is no rebound.  Musculoskeletal:        General: No edema.  Lymphadenopathy:    She has no cervical adenopathy.  Neurological: She is alert and oriented to person, place, and time. No cranial nerve deficit.  Skin: Skin is warm and dry.  Psychiatric: She has a normal mood and affect.  Nursing note and vitals reviewed.        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  Plan: Elective PCI to pRCA and SVG-RCA Solumedrol for contrast allergy.  Brilinta load.  Warfarin to be resumed post PCI.  Nigel Mormon, MD [facility] Pager: [phone] Office: [phone] If no answer Cell [phone]

## 2018-07-28 ENCOUNTER — Encounter (HOSPITAL_COMMUNITY)
Admission: RE | Disposition: A | Discharge status: Home / Self Care | Payer: Self-pay | Source: Home / Self Care | Attending: Cardiology

## 2018-07-28 ENCOUNTER — Ambulatory Visit (HOSPITAL_COMMUNITY)
Admission: RE | Admit: 2018-07-28 | Discharge: 2018-07-28 | Disposition: A | Discharge status: Home / Self Care | Payer: 59 | Attending: Cardiology | Admitting: Cardiology

## 2018-07-28 DIAGNOSIS — Z7951 Long term (current) use of inhaled steroids: Secondary | ICD-10-CM | POA: Diagnosis not present

## 2018-07-28 DIAGNOSIS — R0789 Other chest pain: Secondary | ICD-10-CM | POA: Diagnosis present

## 2018-07-28 DIAGNOSIS — Z8249 Family history of ischemic heart disease and other diseases of the circulatory system: Secondary | ICD-10-CM | POA: Diagnosis not present

## 2018-07-28 DIAGNOSIS — E785 Hyperlipidemia, unspecified: Secondary | ICD-10-CM | POA: Diagnosis not present

## 2018-07-28 DIAGNOSIS — Z952 Presence of prosthetic heart valve: Secondary | ICD-10-CM | POA: Insufficient documentation

## 2018-07-28 DIAGNOSIS — Z539 Procedure and treatment not carried out, unspecified reason: Secondary | ICD-10-CM | POA: Insufficient documentation

## 2018-07-28 DIAGNOSIS — I10 Essential (primary) hypertension: Secondary | ICD-10-CM | POA: Diagnosis not present

## 2018-07-28 DIAGNOSIS — I272 Pulmonary hypertension, unspecified: Secondary | ICD-10-CM | POA: Diagnosis not present

## 2018-07-28 DIAGNOSIS — I4891 Unspecified atrial fibrillation: Secondary | ICD-10-CM | POA: Diagnosis not present

## 2018-07-28 DIAGNOSIS — Z79899 Other long term (current) drug therapy: Secondary | ICD-10-CM | POA: Diagnosis not present

## 2018-07-28 DIAGNOSIS — Z1159 Encounter for screening for other viral diseases: Secondary | ICD-10-CM | POA: Insufficient documentation

## 2018-07-28 DIAGNOSIS — I2510 Atherosclerotic heart disease of native coronary artery without angina pectoris: Secondary | ICD-10-CM | POA: Diagnosis not present

## 2018-07-28 DIAGNOSIS — Z7901 Long term (current) use of anticoagulants: Secondary | ICD-10-CM | POA: Insufficient documentation

## 2018-07-28 DIAGNOSIS — I209 Angina pectoris, unspecified: Secondary | ICD-10-CM

## 2018-07-28 DIAGNOSIS — Z951 Presence of aortocoronary bypass graft: Secondary | ICD-10-CM | POA: Insufficient documentation

## 2018-07-28 DIAGNOSIS — Z7982 Long term (current) use of aspirin: Secondary | ICD-10-CM | POA: Insufficient documentation

## 2018-07-28 DIAGNOSIS — I25708 Atherosclerosis of coronary artery bypass graft(s), unspecified, with other forms of angina pectoris: Secondary | ICD-10-CM

## 2018-07-28 DIAGNOSIS — Z87891 Personal history of nicotine dependence: Secondary | ICD-10-CM | POA: Diagnosis not present

## 2018-07-28 DIAGNOSIS — I350 Nonrheumatic aortic (valve) stenosis: Secondary | ICD-10-CM | POA: Diagnosis not present

## 2018-07-28 DIAGNOSIS — R791 Abnormal coagulation profile: Secondary | ICD-10-CM

## 2018-07-28 LAB — BASIC METABOLIC PANEL
Anion gap: 12 (ref 5–15)
BUN: 14 mg/dL (ref 6–20)
CO2: 23 mmol/L (ref 22–32)
Calcium: 9.7 mg/dL (ref 8.9–10.3)
Chloride: 103 mmol/L (ref 98–111)
Creatinine, Ser: 0.8 mg/dL (ref 0.44–1.00)
GFR calc Af Amer: >60 mL/min (ref >60)
GFR calc non Af Amer: >60 mL/min (ref >60)
Glucose, Bld: 138 mg/dL — ABNORMAL HIGH (ref 70–99)
Potassium: 4.4 mmol/L (ref 3.5–5.1)
Sodium: 138 mmol/L (ref 135–145)

## 2018-07-28 LAB — CBC
HCT: 40.4 % (ref 36.0–46.0)
Hemoglobin: 13.2 g/dL (ref 12.0–15.0)
MCH: 29.6 pg (ref 26.0–34.0)
MCHC: 32.7 g/dL (ref 30.0–36.0)
MCV: 90.6 fL (ref 80.0–100.0)
Platelets: 183 10*3/uL (ref 150–400)
RBC: 4.46 MIL/uL (ref 3.87–5.11)
RDW: 14.5 % (ref 11.5–15.5)
WBC: 7.9 10*3/uL (ref 4.0–10.5)
nRBC: 0 % (ref 0.0–0.2)

## 2018-07-28 LAB — URINALYSIS, ROUTINE W REFLEX MICROSCOPIC
Bilirubin Urine: NEGATIVE
Glucose, UA: NEGATIVE mg/dL
Ketones, ur: NEGATIVE mg/dL
Leukocytes,Ua: NEGATIVE
Nitrite: NEGATIVE
Protein, ur: 100 mg/dL — AB
RBC / HPF: >50 RBC/hpf — ABNORMAL HIGH (ref 0–5)
Specific Gravity, Urine: 1.016 (ref 1.005–1.030)
WBC, UA: >50 WBC/hpf — ABNORMAL HIGH (ref 0–5)
pH: 6 (ref 5.0–8.0)

## 2018-07-28 LAB — PROTIME-INR
INR: 1 (ref 0.8–1.2)
Prothrombin Time: 13.3 seconds (ref 11.4–15.2)

## 2018-07-28 LAB — TROPONIN I: Troponin I: <0.03 ng/mL (ref <0.03)

## 2018-07-28 SURGERY — CORONARY STENT INTERVENTION
Anesthesia: LOCAL

## 2018-07-28 MED ORDER — NITROFURANTOIN MONOHYD MACRO 100 MG PO CAPS: 100.0000 mg | ORAL_CAPSULE | Freq: Two times a day (BID) | ORAL | Status: DC

## 2018-07-28 MED ORDER — ENOXAPARIN SODIUM 100 MG/ML ~~LOC~~ SOLN: 1.0000 mg/kg | Freq: Two times a day (BID) | SUBCUTANEOUS | 1 refills | Status: DC

## 2018-07-28 MED ORDER — SODIUM CHLORIDE 0.9 % WEIGHT BASED INFUSION: 1.0000 mL/kg/h | INTRAVENOUS | Status: DC

## 2018-07-28 MED ORDER — SODIUM CHLORIDE 0.9% FLUSH: 3.0000 mL | Freq: Two times a day (BID) | INTRAVENOUS | Status: DC

## 2018-07-28 MED ORDER — DIPHENHYDRAMINE HCL 50 MG/ML IJ SOLN: 25.0000 mg | Freq: Once | INTRAMUSCULAR | Status: DC

## 2018-07-28 MED ORDER — SODIUM CHLORIDE 0.9% FLUSH: 3.0000 mL | INTRAVENOUS | Status: DC | PRN

## 2018-07-28 MED ORDER — METHYLPREDNISOLONE SODIUM SUCC 125 MG IJ SOLR: 125.0000 mg | Freq: Once | INTRAMUSCULAR | Status: DC

## 2018-07-28 MED ORDER — PANTOPRAZOLE SODIUM 40 MG PO TBEC: 40.0000 mg | DELAYED_RELEASE_TABLET | Freq: Every day | ORAL | Status: DC

## 2018-07-28 MED ORDER — NITROFURANTOIN MONOHYD MACRO 100 MG PO CAPS: 100.0000 mg | ORAL_CAPSULE | Freq: Two times a day (BID) | ORAL | 0 refills | Status: DC

## 2018-07-28 MED ORDER — ASPIRIN 81 MG PO CHEW: 81.0000 mg | CHEWABLE_TABLET | ORAL | Status: DC

## 2018-07-28 MED ORDER — SODIUM CHLORIDE 0.9 % WEIGHT BASED INFUSION: 3.0000 mL/kg/h | INTRAVENOUS | Status: AC

## 2018-07-28 MED ORDER — PANTOPRAZOLE SODIUM 40 MG PO TBEC: 40.0000 mg | DELAYED_RELEASE_TABLET | Freq: Every day | ORAL | 1 refills | Status: DC

## 2018-07-28 MED ORDER — SODIUM CHLORIDE 0.9 % IV SOLN: 250.0000 mL | INTRAVENOUS | Status: DC | PRN

## 2018-07-28 MED ORDER — CLOPIDOGREL BISULFATE 75 MG PO TABS: 600.0000 mg | ORAL_TABLET | Freq: Once | ORAL | Status: DC

## 2018-07-28 NOTE — Progress Notes (Signed)
Patient arrived for elective PCI for stable angina. However, she has had gross hematuria for past 3 days. She is currently on aspirin, lovenox. Warfarin has been on hold for past two days for the procedure today.  Given her hematuria, I am unable to start on plavix, and unable to perform the stenting. UA obtained. Concern for UTI. I started her on Nitrofurantoin for 3 days. I personally spoke with her PCP office to follow up on this and consider urology referral.  She should continue to hold warfarin until further workup and continue lovenox. Some form of anticoagulation is must given her mechanical valves.   Nigel Mormon, MD [facility] [place] Pager: [phone] Office: [phone] If no answer Cell [phone]

## 2018-07-29 ENCOUNTER — Ambulatory Visit
Admission: RE | Admit: 2018-07-29 | Discharge: 2018-07-29 | Disposition: A | Discharge status: Home / Self Care | Payer: 59 | Source: Ambulatory Visit | Attending: Physician Assistant | Admitting: Physician Assistant

## 2018-07-29 ENCOUNTER — Other Ambulatory Visit: Payer: Self-pay | Admitting: Physician Assistant

## 2018-07-29 DIAGNOSIS — R319 Hematuria, unspecified: Secondary | ICD-10-CM

## 2018-07-29 LAB — URINE CULTURE

## 2018-08-03 ENCOUNTER — Encounter: Payer: Self-pay | Admitting: Cardiology

## 2018-08-03 ENCOUNTER — Ambulatory Visit (INDEPENDENT_AMBULATORY_CARE_PROVIDER_SITE_OTHER): Payer: 59 | Admitting: Cardiology

## 2018-08-03 ENCOUNTER — Other Ambulatory Visit: Payer: Self-pay

## 2018-08-03 DIAGNOSIS — I340 Nonrheumatic mitral (valve) insufficiency: Secondary | ICD-10-CM

## 2018-08-03 DIAGNOSIS — I351 Nonrheumatic aortic (valve) insufficiency: Secondary | ICD-10-CM | POA: Diagnosis not present

## 2018-08-03 DIAGNOSIS — Z5181 Encounter for therapeutic drug level monitoring: Secondary | ICD-10-CM

## 2018-08-03 DIAGNOSIS — Z7901 Long term (current) use of anticoagulants: Secondary | ICD-10-CM

## 2018-08-03 LAB — POCT INR: INR: 1 — AB (ref 2.0–3.0)

## 2018-08-03 NOTE — Progress Notes (Signed)
Subjective:   Kimberly Hobbs, female    DOB: Oct 12, 1960, 58 y.o.   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   Patient has been experiencing exertional chest pain and shortness of breath. She underwent TEE, RHC, coronary and bypass graft angiography on 07/21/2018 that showed WHO GRP II PH, responsive to diuresis. It also showed 70-80% lesions in prox RCA and ostial SVG-dRCA. She was scheduled to undergo elective PCI to these lesions on 07/28/2018. However, this had to be canceled due to ongoing hematuria and resultant inability to place her on plavix. She was asked to continue to hold warfain, continue lovenox and aspirin. I gave her 3 day course of nitrofurantoin for possible UTI and recommended PCP/urology workup.  Patient is here for follow up today. Her hematuria has resolved. She saw her PCP and is going to see a urologist tomorrow. Her breathing has improved. She has not had any recent chest pain episodes.   Past Medical History:  Diagnosis Date  . Hyperlipidemia   . Hypertension   . Rheumatic heart disease    MS/ AS     Past Surgical History:  Procedure Laterality Date  . CHOLECYSTECTOMY    . LEG SURGERY     "metal plate in leg"  . RIGHT HEART CATH AND CORONARY/GRAFT ANGIOGRAPHY N/A 07/21/2018   Procedure: RIGHT HEART CATH AND CORONARY/GRAFT ANGIOGRAPHY;  Surgeon: Elder Negus, MD;  Location: [facility] INVASIVE CV LAB;  Service: Cardiovascular;  Laterality: N/A;  . TEE WITHOUT CARDIOVERSION N/A 07/27/2015   Procedure: TRANSESOPHAGEAL ECHOCARDIOGRAM (TEE);  Surgeon: Thurmon Fair, MD;  Location: [facility] ENDOSCOPY;  Service: Cardiovascular;   Laterality: N/A;  . TEE WITHOUT CARDIOVERSION N/A 07/21/2018   Procedure: TRANSESOPHAGEAL ECHOCARDIOGRAM (TEE);  Surgeon: Elder Negus, MD;  Location: [facility] ENDOSCOPY;  Service: Cardiovascular;  Laterality: N/A;  . TONSILLECTOMY AND ADENOIDECTOMY       Social History   Socioeconomic History  . Marital status: Married    Spouse name: Not on file  . Number of children: 4  . Years of education: Not on file  . Highest education level: Not on file  Occupational History  . Not on file  Social Needs  . Financial resource strain: Not on file  . Food insecurity    Worry: Not on file    Inability: Not on file  . Transportation needs    Medical: Not on file    Non-medical: Not on file  Tobacco Use  . Smoking status: Former Smoker    Packs/day: 1.50    Years: 40.00    Pack years: 60.00    Types: Cigarettes    Quit date: 06/04/2017    Years since quitting: 1.1  . Smokeless tobacco: Never Used  Substance and Sexual Activity  . Alcohol use: Yes  . Drug use: No  . Sexual activity: Not on file  Lifestyle  . Physical activity    Days per week: Not on file    Minutes per session: Not on file  . Stress: Not on file  Relationships  . Social Musician on phone: Not on file    Gets together: Not on file  Attends religious service: Not on file    Active member of club or organization: Not on file    Attends meetings of clubs or organizations: Not on file    Relationship status: Not on file  . Intimate partner violence    Fear of current or ex partner: Not on file    Emotionally abused: Not on file    Physically abused: Not on file    Forced sexual activity: Not on file  Other Topics Concern  . Not on file  Social History Narrative   Epworth Sleepiness Scale = 4 (as of 06/28/2015)     Family History  Problem Relation Age of Onset  . Cancer Mother   . Hypertension Mother   . Diabetes Mother   . Hyperlipidemia Mother   . Heart disease Mother   . Cancer Father    . Hypertension Father   . Diabetes Father   . Heart disease Father   . Hypertension Sister   . Hypertension Brother   . Hypertension Brother      Current Outpatient Medications on File Prior to Visit  Medication Sig Dispense Refill  . acetaminophen (TYLENOL) 500 MG tablet Take 500 mg by mouth every 6 (six) hours as needed (for pain.).    Marland Kitchen albuterol (VENTOLIN HFA) 108 (90 Base) MCG/ACT inhaler Inhale 2 puffs into the lungs every 6 (six) hours as needed for wheezing or shortness of breath.     Marland Kitchen aspirin EC 81 MG tablet Take 81 mg by mouth daily.     . citalopram (CELEXA) 40 MG tablet Take 20 mg by mouth 2 (two) times a day.    . clonazePAM (KLONOPIN) 0.5 MG tablet Take 0.5 mg by mouth at bedtime.     . enoxaparin (LOVENOX) 100 MG/ML injection Inject 1.05 mLs (105 mg total) into the skin every 12 (twelve) hours. 30 Syringe 1  . fluticasone (FLOVENT DISKUS) 50 MCG/BLIST diskus inhaler Inhale 1 puff into the lungs daily.     . furosemide (LASIX) 20 MG tablet Take 1 tablet (20 mg total) by mouth as needed. For shortness of breath or leg edema (Patient taking differently: Take 20 mg by mouth daily as needed (shortness of breath/leg swelling.). ) 30 tablet 1  . isosorbide mononitrate (IMDUR) 60 MG 24 hr tablet Take 1 tablet (60 mg total) by mouth daily. 90 tablet 3  . losartan (COZAAR) 50 MG tablet TAKE 1 TABLET BY MOUTH EVERY DAY (Patient taking differently: Take 50 mg by mouth daily. ) 30 tablet 6  . metoprolol tartrate (LOPRESSOR) 100 MG tablet Take 100 mg by mouth 2 (two) times a day.    . nitrofurantoin, macrocrystal-monohydrate, (MACROBID) 100 MG capsule Take 1 capsule (100 mg total) by mouth every 12 (twelve) hours. 6 capsule 0  . nitroGLYCERIN (NITROSTAT) 0.4 MG SL tablet Place 0.4 mg under the tongue every 5 (five) minutes x 3 doses as needed for chest pain.     . pantoprazole (PROTONIX) 40 MG tablet Take 1 tablet (40 mg total) by mouth daily. 30 tablet 1  . rosuvastatin (CRESTOR) 40 MG  tablet Take 40 mg by mouth daily.     Marland Kitchen spironolactone (ALDACTONE) 50 MG tablet Take 1 tablet (50 mg total) by mouth daily. 30 tablet 3   No current facility-administered medications on file prior to visit.     Cardiovascular studies:  EKG 02/19/2018: Probable sinus rhythm 76 bpm. Poor R wave progression  Lexiscan sestamibi stress test 02/27/2018: 1. Lexiscan stress test  with low level exercise was performed. Exercise capacity was not assessed. No stress symptoms reported. Peak effect blood pressure was 220/86 mmHg. The resting electrocardiogram demonstrated normal sinus rhythm, normal resting conduction, no resting arrhythmias and normal rest repolarization.  Stress EKG is non diagnostic for ischemia as it is a pharmacologic stress. In addition, the stress electrocardiogram showed sinus tachycardia, normal stress conduction, occasional PVC, and normal stress repolarization.   2. The overall quality of the study is good.  Left ventricular cavity is noted to be normal on the rest and stress studies.  Gated SPECT images reveal normal myocardial thickening and wall motion.  The left ventricular ejection fraction was calculated or visually estimated to be 61%. Medium sized, mild intensity, partially reversible perfusion defect in mid to apical  inferior/inferolateral myocardium. 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  3. Intermediate risk study.  Echocardiogram 02/26/2018: Left ventricle cavity is normal in size. Mild concentric hypertrophy of the left ventricle. Abnormal septal wall motion due to post-operative valve. Diastolic function could not be assessed due to post op valve and CABG status.  Calculated EF 63%. Left atrial cavity is moderate to severely dilated measures 4.5 cm in long axis. Right atrial cavity is mildly dilated. Mechanical aortic valve with trace regurgitation. Mild aortic valve leaflet calcification. Mildly restricted aortic valve  leaflets. Mild to moderate aortic valve stenosis. Aortic valve peak pressure gradient of  43 and mean gradient of 21 mmHg, calculated aortic valve area    0.88 cm. Mechanical mitral valve with trace regurgitation. Moderately restricted mitral valve leaflets. Mild mitral valve stenosis. Mitral valve peak pressure gradient of  26  and mean gradient of  6.2  mmHg, calculated mitral valve area 1.9   cm. Mild to moderate tricuspid regurgitation. Mild pulmonary hypertension. PA systolic pressure estimated at 39 mm Hg. Compared to the study done on 08/06/2017, no significant change.  Echocardiogram 08/06/2017: Left ventricle cavity is normal in size. Moderate concentric hypertrophy of the left ventricle. Normal global wall motion. Indeterminate diastolic filling pattern. Calculated EF 63%. Mechanical aortic valve with trace regurgitation. Mild pannus formation on the mechanical AV. Mildly restricted aortic valve leaflets. Mild to moderate aortic valve stenosis. Aortic valve peak pressure gradient of 41 and mean gradient of 22 mmHg, calculated aortic valve area   1.0 cm. Mechanical mitral valve with trace regurgitation. Mild pannus on the mechanical MV. Mildly restricted mitral valve leaflets. Mitral valve peak pressure gradient of 14 and mean gradient of 4 mmHg, calculated mitral valve area 1.8 cm. Mild to moderate tricuspid regurgitation. Mild pulmonary hypertension.   Recent labs: Labs 02/16/2018: BNP 251 elevated  Labs 07/02/2017: A1c 5.7%, HB 12.9/HCT 38.6, platelets 220, normal indicis.  Serum glucose 90 mg, BUN 23, creatinine 0.75, CMP normal.  Potassium 4.6.  TSH normal.  Review of Systems  Constitution: Negative for decreased appetite, malaise/fatigue, weight gain and weight loss.  HENT: Negative for congestion.   Eyes: Negative for visual disturbance.  Cardiovascular: Positive for chest pain and dyspnea on exertion. Negative for leg swelling, palpitations and syncope.  Respiratory:  Positive for shortness of breath.   Endocrine: Negative for cold intolerance.  Hematologic/Lymphatic: Does not bruise/bleed easily.  Skin: Negative for itching and rash.  Musculoskeletal: Negative for myalgias.  Gastrointestinal: Negative for abdominal pain, nausea and vomiting.  Genitourinary: Negative for dysuria.  Neurological: Negative for dizziness and weakness.  Psychiatric/Behavioral: The patient is not nervous/anxious.   All other systems reviewed and are negative.  Vitals:   08/03/18 1204  BP: 130/65  Pulse: (!) 57  SpO2: 96%   (Measured by the patient using a home BP monitor)   Objective:     Physical Exam  Constitutional: She is oriented to person, place, and time. She appears well-developed and well-nourished. No distress.  HENT:  Head: Normocephalic and atraumatic.  Eyes: Pupils are equal, round, and reactive to light. Conjunctivae are normal.  Neck: No JVD present.  Cardiovascular: Normal rate, regular rhythm and intact distal pulses.  Murmur heard.  Harsh midsystolic murmur is present with a grade of 2/6 at the upper right sternal border radiating to the neck. Crisp metallic S1 & S2. No diastolic murmur heard.  Pulmonary/Chest: Effort normal and breath sounds normal. She has no wheezes. She has no rales.  Abdominal: Soft. Bowel sounds are normal. There is no rebound.  Musculoskeletal:        General: No edema.  Lymphadenopathy:    She has no cervical adenopathy.  Neurological: She is alert and oriented to person, place, and time. No cranial nerve deficit.  Skin: Skin is warm and dry.  Psychiatric: She has a normal mood and affect.  Nursing note and vitals reviewed.        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   CAD of native and bypass grafts with angina, S/p MVR, AVR, subtherapeutic INR: 70-80% stenoses in pRCA and SVG-dRCA. Elective PCI on hold due to hematuria.  Warfarin currently on hold, initially for elective PCI and now due to to hematuria. She is on therapeutic lovenox. She has an appt to see urology tomorrow. If okayed by urology, will ask her to resume warfarin 7.5 mg daily. I would like her to get her to INR 2.5, then stop lovenox. At that point, I will start plavix to see how she tolerates from hematuria standpoint. If well tolerated, will then consider elective PCI in the coming future.  Continue current anti anginal therapy.  Hypertension: Much better controlled.    INR check in 10 days.   Elder Negus, MD [facility] Pager: [phone] Office: [phone] If no answer Cell [phone]

## 2018-08-04 ENCOUNTER — Telehealth: Payer: Self-pay | Admitting: Cardiology

## 2018-08-04 NOTE — Telephone Encounter (Signed)
Cystoscopy reportedly normal, per the patient. Resume warfarin 7.5 mg daily today.  Nigel Mormon, MD [facility] Pager: [phone] Office: [phone] If no answer Cell [phone]

## 2018-08-05 ENCOUNTER — Telehealth: Payer: Self-pay

## 2018-08-05 NOTE — Telephone Encounter (Signed)
Pharmacy and patient called regarding Lovenox injection stated that it doesn't come in 1.05 ml/mg but 1 wanted to see if that could be changed?

## 2018-08-05 NOTE — Telephone Encounter (Signed)
1 ml/mg is fine She needs 100 mg bid dose

## 2018-08-06 ENCOUNTER — Other Ambulatory Visit: Payer: Self-pay

## 2018-08-06 ENCOUNTER — Other Ambulatory Visit: Payer: Self-pay | Admitting: Cardiology

## 2018-08-06 DIAGNOSIS — Z952 Presence of prosthetic heart valve: Secondary | ICD-10-CM

## 2018-08-06 DIAGNOSIS — R791 Abnormal coagulation profile: Secondary | ICD-10-CM

## 2018-08-06 MED ORDER — ENOXAPARIN SODIUM 100 MG/ML ~~LOC~~ SOLN: 100.0000 mg | Freq: Two times a day (BID) | SUBCUTANEOUS | 11 refills | Status: DC

## 2018-08-06 NOTE — Telephone Encounter (Signed)
Please update in the system I am unable to do so, contacted pharmacy to distribute medication

## 2018-08-06 NOTE — Telephone Encounter (Signed)
Done.

## 2018-08-07 ENCOUNTER — Other Ambulatory Visit: Payer: Self-pay

## 2018-08-07 MED ORDER — ROSUVASTATIN CALCIUM 40 MG PO TABS: 40.0000 mg | ORAL_TABLET | Freq: Every day | ORAL | 1 refills | Status: DC

## 2018-08-14 ENCOUNTER — Other Ambulatory Visit: Payer: Self-pay | Admitting: Cardiology

## 2018-08-14 ENCOUNTER — Other Ambulatory Visit (HOSPITAL_COMMUNITY)
Admission: RE | Admit: 2018-08-14 | Discharge: 2018-08-14 | Disposition: A | Discharge status: Home / Self Care | Payer: 59 | Source: Ambulatory Visit | Attending: Cardiology | Admitting: Cardiology

## 2018-08-14 ENCOUNTER — Other Ambulatory Visit: Payer: Self-pay

## 2018-08-14 ENCOUNTER — Ambulatory Visit (INDEPENDENT_AMBULATORY_CARE_PROVIDER_SITE_OTHER): Payer: 59 | Admitting: Cardiology

## 2018-08-14 ENCOUNTER — Encounter: Payer: Self-pay | Admitting: Cardiology

## 2018-08-14 VITALS — BP 147/67 | HR 56 | Ht 65.0 in | Wt 228.0 lb

## 2018-08-14 DIAGNOSIS — I25118 Atherosclerotic heart disease of native coronary artery with other forms of angina pectoris: Secondary | ICD-10-CM

## 2018-08-14 DIAGNOSIS — Z1159 Encounter for screening for other viral diseases: Secondary | ICD-10-CM | POA: Insufficient documentation

## 2018-08-14 DIAGNOSIS — Z7901 Long term (current) use of anticoagulants: Secondary | ICD-10-CM | POA: Diagnosis not present

## 2018-08-14 DIAGNOSIS — I25709 Atherosclerosis of coronary artery bypass graft(s), unspecified, with unspecified angina pectoris: Secondary | ICD-10-CM

## 2018-08-14 DIAGNOSIS — I340 Nonrheumatic mitral (valve) insufficiency: Secondary | ICD-10-CM | POA: Diagnosis not present

## 2018-08-14 DIAGNOSIS — I25708 Atherosclerosis of coronary artery bypass graft(s), unspecified, with other forms of angina pectoris: Secondary | ICD-10-CM

## 2018-08-14 DIAGNOSIS — I351 Nonrheumatic aortic (valve) insufficiency: Secondary | ICD-10-CM

## 2018-08-14 LAB — SARS CORONAVIRUS 2 (TAT 6-24 HRS): SARS Coronavirus 2: NEGATIVE

## 2018-08-14 LAB — POCT INR: INR: 2.2 (ref 2.0–3.0)

## 2018-08-14 MED ORDER — CLOPIDOGREL BISULFATE 75 MG PO TABS: 75.0000 mg | ORAL_TABLET | Freq: Every day | ORAL | 3 refills | Status: DC

## 2018-08-14 NOTE — Progress Notes (Signed)
° ° °

## 2018-08-14 NOTE — Progress Notes (Signed)
Subjective:   Kimberly Hobbs, female    DOB: 1960-08-24, 58 y.o.   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  Patient was supposed to undro PCI to prox RCA and ostial SVG-dRCA in early june due to worsening angina symptoms. However, this was kept on hold due to hematuria. Patient saw urology evaluation, and has not had any recurrence hematuria since then. She continues to have exertional chest pressure. Leg edema has improved. INR is 2.2 today.    Past Medical History:  Diagnosis Date   Hyperlipidemia    Hypertension    Rheumatic heart disease    MS/ AS     Past Surgical History:  Procedure Laterality Date   CHOLECYSTECTOMY     LEG SURGERY     "metal plate in leg"   RIGHT HEART CATH AND CORONARY/GRAFT ANGIOGRAPHY N/A 07/21/2018   Procedure: RIGHT HEART CATH AND CORONARY/GRAFT ANGIOGRAPHY;  Surgeon: Nigel Mormon, MD;  Location: [facility];  Service: Cardiovascular;  Laterality: N/A;   TEE WITHOUT CARDIOVERSION N/A 07/27/2015   Procedure: TRANSESOPHAGEAL ECHOCARDIOGRAM (TEE);  Surgeon: Sanda Klein, MD;  Location: [facility] ENDOSCOPY;  Service: Cardiovascular;  Laterality: N/A;   TEE WITHOUT CARDIOVERSION N/A 07/21/2018   Procedure: TRANSESOPHAGEAL ECHOCARDIOGRAM (TEE);  Surgeon: Nigel Mormon, MD;  Location: [facility] ENDOSCOPY;  Service: Cardiovascular;  Laterality: N/A;   TONSILLECTOMY AND ADENOIDECTOMY       Social History   Socioeconomic History   Marital status: Married    Spouse name: Not on file   Number of children: 4   Years of education: Not on file   Highest education level: Not on file  Occupational History   Not on file  Social Needs   Financial resource strain:  Not on file   Food insecurity    Worry: Not on file    Inability: Not on file   Transportation needs    Medical: Not on file    Non-medical: Not on file  Tobacco Use   Smoking status: Former Smoker    Packs/day: 1.50    Years: 40.00    Pack years: 60.00    Types: Cigarettes    Quit date: 06/04/2017    Years since quitting: 1.1   Smokeless tobacco: Never Used  Substance and Sexual Activity   Alcohol use: Not Currently   Drug use: No   Sexual activity: Not on file  Lifestyle   Physical activity    Days per week: Not on file    Minutes per session: Not on file   Stress: Not on file  Relationships   Social connections    Talks on phone: Not on file    Gets together: Not on file    Attends religious service: Not on file    Active member of club or organization: Not on file    Attends meetings of clubs or organizations: Not on file    Relationship status: Not on file   Intimate partner violence    Fear of current or ex partner: Not on file    Emotionally abused: Not on file    Physically abused: Not on file    Forced sexual activity: Not on file  Other Topics Concern   Not on file  Social History Narrative   Epworth Sleepiness Scale = 4 (as of 06/28/2015)     Family History  Problem Relation Age of Onset   Cancer Mother    Hypertension Mother    Diabetes Mother    Hyperlipidemia Mother    Heart disease Mother    Cancer Father    Hypertension Father    Diabetes Father    Heart disease Father    Hypertension Sister    Hypertension Brother    Hypertension Brother      Current Outpatient Medications on File Prior to Visit  Medication Sig Dispense Refill   acetaminophen (TYLENOL) 500 MG tablet Take 500 mg by mouth every 6 (six) hours as needed (for pain.).     albuterol (VENTOLIN HFA) 108 (90 Base) MCG/ACT inhaler Inhale 2 puffs into the lungs every 6 (six) hours as needed for wheezing or shortness of breath.      aspirin EC 81 MG tablet  Take 81 mg by mouth daily.      busPIRone (BUSPAR) 5 MG tablet daily.     citalopram (CELEXA) 40 MG tablet Take 20 mg by mouth 2 (two) times a day.     clonazePAM (KLONOPIN) 0.5 MG tablet Take 0.5 mg by mouth at bedtime.      clopidogrel (PLAVIX) 75 MG tablet Take 1 tablet (75 mg total) by mouth daily. Take 4 pills on day 1. Then take 1 pill daily. 90 tablet 3   enoxaparin (LOVENOX) 100 MG/ML injection Inject 1 mL (100 mg total) into the skin every 12 (twelve) hours. 60 mL 11   fluticasone (FLOVENT DISKUS) 50 MCG/BLIST diskus inhaler Inhale 1 puff into the lungs daily.      furosemide (LASIX) 20 MG tablet Take 1 tablet (20 mg total) by mouth as needed. For shortness of breath or leg edema (Patient taking differently: Take 20 mg by mouth daily. ) 30 tablet 1   isosorbide mononitrate (IMDUR) 60 MG 24 hr tablet Take 1 tablet (60 mg total) by mouth daily. 90 tablet 3   losartan (COZAAR) 50 MG tablet TAKE 1 TABLET BY MOUTH EVERY DAY (Patient taking differently: Take 50 mg by mouth daily. ) 30 tablet 6   metoprolol tartrate (LOPRESSOR) 100 MG tablet Take 100 mg by mouth 2 (two) times a day.     nitroGLYCERIN (NITROSTAT) 0.4 MG SL tablet Place 0.4 mg under the tongue every 5 (five) minutes x 3 doses as needed for chest pain.      pantoprazole (PROTONIX) 40 MG tablet Take 1 tablet (40 mg total) by mouth daily. 30 tablet 1   rosuvastatin (CRESTOR) 40 MG tablet Take 1 tablet (40 mg total) by mouth daily. 90 tablet 1   spironolactone (ALDACTONE) 50 MG tablet Take 1 tablet (50 mg total) by mouth daily. 30 tablet 3   No current facility-administered medications on file prior to visit.     Cardiovascular studies:  EKG 02/19/2018: Probable sinus rhythm 76 bpm. Poor R wave progression  Lexiscan sestamibi stress test 02/27/2018: 1. Lexiscan stress test with low level exercise was performed. Exercise capacity was not assessed. No stress symptoms reported. Peak effect blood pressure was 220/86  mmHg. The resting electrocardiogram demonstrated normal sinus rhythm, normal resting conduction, no resting arrhythmias and normal rest repolarization.  Stress EKG is non diagnostic for ischemia as it is a pharmacologic stress. In addition, the stress electrocardiogram showed sinus tachycardia, normal stress conduction, occasional PVC, and normal stress repolarization.   2. The overall quality  of the study is good.  Left ventricular cavity is noted to be normal on the rest and stress studies.  Gated SPECT images reveal normal myocardial thickening and wall motion.  The left ventricular ejection fraction was calculated or visually estimated to be 61%. Medium sized, mild intensity, partially reversible perfusion defect in mid to apical  inferior/inferolateral myocardium. 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  3. Intermediate risk study.  Echocardiogram 02/26/2018: Left ventricle cavity is normal in size. Mild concentric hypertrophy of the left ventricle. Abnormal septal wall motion due to post-operative valve. Diastolic function could not be assessed due to post op valve and CABG status.  Calculated EF 63%. Left atrial cavity is moderate to severely dilated measures 4.5 cm in long axis. Right atrial cavity is mildly dilated. Mechanical aortic valve with trace regurgitation. Mild aortic valve leaflet calcification. Mildly restricted aortic valve leaflets. Mild to moderate aortic valve stenosis. Aortic valve peak pressure gradient of  43 and mean gradient of 21 mmHg, calculated aortic valve area    0.88 cm. Mechanical mitral valve with trace regurgitation. Moderately restricted mitral valve leaflets. Mild mitral valve stenosis. Mitral valve peak pressure gradient of  26  and mean gradient of  6.2  mmHg, calculated mitral valve area 1.9   cm. Mild to moderate tricuspid regurgitation. Mild pulmonary hypertension. PA systolic pressure estimated at 39 mm Hg. Compared to  the study done on 08/06/2017, no significant change.  Echocardiogram 08/06/2017: Left ventricle cavity is normal in size. Moderate concentric hypertrophy of the left ventricle. Normal global wall motion. Indeterminate diastolic filling pattern. Calculated EF 63%. Mechanical aortic valve with trace regurgitation. Mild pannus formation on the mechanical AV. Mildly restricted aortic valve leaflets. Mild to moderate aortic valve stenosis. Aortic valve peak pressure gradient of 41 and mean gradient of 22 mmHg, calculated aortic valve area   1.0 cm. Mechanical mitral valve with trace regurgitation. Mild pannus on the mechanical MV. Mildly restricted mitral valve leaflets. Mitral valve peak pressure gradient of 14 and mean gradient of 4 mmHg, calculated mitral valve area 1.8 cm. Mild to moderate tricuspid regurgitation. Mild pulmonary hypertension.   Recent labs: Labs 02/16/2018: BNP 251 elevated  Labs 07/02/2017: A1c 5.7%, HB 12.9/HCT 38.6, platelets 220, normal indicis.  Serum glucose 90 mg, BUN 23, creatinine 0.75, CMP normal.  Potassium 4.6.  TSH normal.  Review of Systems  Constitution: Negative for decreased appetite, malaise/fatigue, weight gain and weight loss.  HENT: Negative for congestion.   Eyes: Negative for visual disturbance.  Cardiovascular: Positive for chest pain and dyspnea on exertion. Negative for leg swelling, palpitations and syncope.  Respiratory: Positive for shortness of breath.   Endocrine: Negative for cold intolerance.  Hematologic/Lymphatic: Does not bruise/bleed easily.  Skin: Negative for itching and rash.  Musculoskeletal: Negative for myalgias.  Gastrointestinal: Negative for abdominal pain, nausea and vomiting.  Genitourinary: Negative for dysuria.  Neurological: Negative for dizziness and weakness.  Psychiatric/Behavioral: The patient is not nervous/anxious.   All other systems reviewed and are negative.        Vitals:   08/14/18 1255  BP: (!)  147/67  Pulse: (!) 56  SpO2: 97%    Objective:     Physical Exam  Constitutional: She is oriented to person, place, and time. She appears well-developed and well-nourished. No distress.  HENT:  Head: Normocephalic and atraumatic.  Eyes: Pupils are equal, round, and reactive to light. Conjunctivae are normal.  Neck: No JVD present.  Cardiovascular: Normal  rate, regular rhythm and intact distal pulses.  Murmur heard.  Harsh midsystolic murmur is present with a grade of 2/6 at the upper right sternal border radiating to the neck. Crisp metallic S1 & S2. No diastolic murmur heard.  Pulmonary/Chest: Effort normal and breath sounds normal. She has no wheezes. She has no rales.  Abdominal: Soft. Bowel sounds are normal. There is no rebound.  Musculoskeletal:        General: No edema.  Lymphadenopathy:    She has no cervical adenopathy.  Neurological: She is alert and oriented to person, place, and time. No cranial nerve deficit.  Skin: Skin is warm and dry.  Psychiatric: She has a normal mood and affect.  Nursing note and vitals reviewed.        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   CAD of native and bypass grafts with angina, S/p MVR, AVR, subtherapeutic INR: 70-80% stenoses in pRCA and SVG-dRCA. Given her class II-III angina symptoms, recommend elective PCI to pRCA and SVG-RCA.  Her hematuria has cleared up. Recommend the following regarding antiplatelet/anticoagulation management.  Take last dose of warfarin on 08/15/2018. Start Plavix on 06/28 with 4 pills on that day, then take 1 pill everyday. Continue Aspirin and lovenox uninterrupted. Get COVID test done  today.  Elective stenting on Tuesday 06/30 through right radial approach. She will also need preparation for contrast dye allergy.     Hypertension: Much better controlled.    INR check in 10 days.   Nigel Mormon, MD [facility] [place] Pager: [phone] Office: [phone] If no answer Cell [phone]

## 2018-08-14 NOTE — Patient Instructions (Addendum)
Take last dose of warfarin on 08/15/2018. Start Plavix on 06/28 with 4 pills on that day, then take 1 pill everyday. Continue Aspirin and lovenox uninterrupted. Get COVID test done today. Stent procedure on Tuesday 06/30  Regards, Dr.Kasandra Fehr

## 2018-08-14 NOTE — Progress Notes (Signed)
Please see discussion from office visit note dated 08/14/2018.

## 2018-08-15 ENCOUNTER — Other Ambulatory Visit: Payer: Self-pay | Admitting: Cardiology

## 2018-08-15 DIAGNOSIS — I25118 Atherosclerotic heart disease of native coronary artery with other forms of angina pectoris: Secondary | ICD-10-CM

## 2018-08-15 MED ORDER — CLOPIDOGREL BISULFATE 75 MG PO TABS: 75.0000 mg | ORAL_TABLET | Freq: Every day | ORAL | 3 refills | Status: DC

## 2018-08-15 NOTE — Progress Notes (Signed)
Patient had not received Rx. Rx sent today

## 2018-08-18 ENCOUNTER — Other Ambulatory Visit: Payer: Self-pay

## 2018-08-18 ENCOUNTER — Ambulatory Visit (HOSPITAL_COMMUNITY)
Admission: RE | Admit: 2018-08-18 | Discharge: 2018-08-18 | Disposition: A | Discharge status: Home / Self Care | Payer: 59 | Source: Ambulatory Visit | Attending: Cardiology | Admitting: Cardiology

## 2018-08-18 ENCOUNTER — Ambulatory Visit (HOSPITAL_COMMUNITY)
Admission: RE | Disposition: A | Discharge status: Home / Self Care | Payer: Self-pay | Source: Ambulatory Visit | Attending: Cardiology

## 2018-08-18 DIAGNOSIS — I4891 Unspecified atrial fibrillation: Secondary | ICD-10-CM | POA: Diagnosis not present

## 2018-08-18 DIAGNOSIS — I25708 Atherosclerosis of coronary artery bypass graft(s), unspecified, with other forms of angina pectoris: Secondary | ICD-10-CM

## 2018-08-18 DIAGNOSIS — R791 Abnormal coagulation profile: Secondary | ICD-10-CM

## 2018-08-18 DIAGNOSIS — I25709 Atherosclerosis of coronary artery bypass graft(s), unspecified, with unspecified angina pectoris: Secondary | ICD-10-CM

## 2018-08-18 DIAGNOSIS — Z952 Presence of prosthetic heart valve: Secondary | ICD-10-CM | POA: Insufficient documentation

## 2018-08-18 DIAGNOSIS — E785 Hyperlipidemia, unspecified: Secondary | ICD-10-CM | POA: Diagnosis not present

## 2018-08-18 DIAGNOSIS — I10 Essential (primary) hypertension: Secondary | ICD-10-CM | POA: Diagnosis not present

## 2018-08-18 DIAGNOSIS — Z951 Presence of aortocoronary bypass graft: Secondary | ICD-10-CM | POA: Diagnosis not present

## 2018-08-18 DIAGNOSIS — I080 Rheumatic disorders of both mitral and aortic valves: Secondary | ICD-10-CM | POA: Insufficient documentation

## 2018-08-18 DIAGNOSIS — Z87891 Personal history of nicotine dependence: Secondary | ICD-10-CM | POA: Diagnosis not present

## 2018-08-18 HISTORY — PX: CORONARY/GRAFT ANGIOGRAPHY: CATH118237

## 2018-08-18 LAB — POCT ACTIVATED CLOTTING TIME: Activated Clotting Time: 406 seconds

## 2018-08-18 LAB — PROTIME-INR
INR: 1.1 (ref 0.8–1.2)
Prothrombin Time: 14.5 seconds (ref 11.4–15.2)

## 2018-08-18 SURGERY — CORONARY/GRAFT ANGIOGRAPHY
Anesthesia: LOCAL

## 2018-08-18 MED ORDER — SODIUM CHLORIDE 0.9% FLUSH: 3.0000 mL | INTRAVENOUS | Status: DC | PRN

## 2018-08-18 MED ORDER — SODIUM CHLORIDE 0.9 % IV SOLN: 250.0000 mL | INTRAVENOUS | Status: DC | PRN

## 2018-08-18 MED ORDER — LIDOCAINE HCL (PF) 1 % IJ SOLN
INTRAMUSCULAR | Status: DC | PRN
  Administered 2018-08-18: 2 mL

## 2018-08-18 MED ORDER — FAMOTIDINE IN NACL 20-0.9 MG/50ML-% IV SOLN
20.0000 mg | Freq: Once | INTRAVENOUS | Status: AC
  Administered 2018-08-18: 20 mg via INTRAVENOUS
  Filled 2018-08-18: qty 50

## 2018-08-18 MED ORDER — MIDAZOLAM HCL 2 MG/2ML IJ SOLN
INTRAMUSCULAR | Status: DC | PRN
  Administered 2018-08-18 (×2): 1 mg via INTRAVENOUS

## 2018-08-18 MED ORDER — FENTANYL CITRATE (PF) 100 MCG/2ML IJ SOLN
INTRAMUSCULAR | Status: DC | PRN
  Administered 2018-08-18: 25 ug via INTRAVENOUS
  Administered 2018-08-18: 50 ug via INTRAVENOUS

## 2018-08-18 MED ORDER — LIDOCAINE HCL (PF) 1 % IJ SOLN
INTRAMUSCULAR | Status: AC
  Filled 2018-08-18: qty 30

## 2018-08-18 MED ORDER — SODIUM CHLORIDE 0.9% FLUSH: 3.0000 mL | Freq: Two times a day (BID) | INTRAVENOUS | Status: DC

## 2018-08-18 MED ORDER — ENOXAPARIN SODIUM 100 MG/ML ~~LOC~~ SOLN: 100.0000 mg | Freq: Two times a day (BID) | SUBCUTANEOUS | 1 refills | Status: DC

## 2018-08-18 MED ORDER — ONDANSETRON HCL 4 MG/2ML IJ SOLN: 4.0000 mg | Freq: Four times a day (QID) | INTRAMUSCULAR | Status: DC | PRN

## 2018-08-18 MED ORDER — HEPARIN (PORCINE) IN NACL 1000-0.9 UT/500ML-% IV SOLN
INTRAVENOUS | Status: DC | PRN
  Administered 2018-08-18: 500 mL

## 2018-08-18 MED ORDER — FENTANYL CITRATE (PF) 100 MCG/2ML IJ SOLN
INTRAMUSCULAR | Status: AC
  Filled 2018-08-18: qty 2

## 2018-08-18 MED ORDER — SODIUM CHLORIDE 0.9 % WEIGHT BASED INFUSION: 1.0000 mL/kg/h | INTRAVENOUS | Status: DC

## 2018-08-18 MED ORDER — IOHEXOL 350 MG/ML SOLN
INTRAVENOUS | Status: DC | PRN
  Administered 2018-08-18: 95 mL via INTRA_ARTERIAL

## 2018-08-18 MED ORDER — ACETAMINOPHEN 325 MG PO TABS: 650.0000 mg | ORAL_TABLET | ORAL | Status: DC | PRN

## 2018-08-18 MED ORDER — NITROGLYCERIN 1 MG/10 ML FOR IR/CATH LAB
INTRA_ARTERIAL | Status: AC
  Filled 2018-08-18: qty 10

## 2018-08-18 MED ORDER — LABETALOL HCL 5 MG/ML IV SOLN: 10.0000 mg | INTRAVENOUS | Status: DC | PRN

## 2018-08-18 MED ORDER — DIPHENHYDRAMINE HCL 50 MG/ML IJ SOLN
25.0000 mg | Freq: Once | INTRAMUSCULAR | Status: AC
  Administered 2018-08-18: 25 mg via INTRAVENOUS
  Filled 2018-08-18: qty 1

## 2018-08-18 MED ORDER — SODIUM CHLORIDE 0.9 % IV SOLN: INTRAVENOUS | Status: AC

## 2018-08-18 MED ORDER — METHYLPREDNISOLONE SODIUM SUCC 125 MG IJ SOLR
125.0000 mg | Freq: Once | INTRAMUSCULAR | Status: AC
  Administered 2018-08-18: 125 mg via INTRAVENOUS
  Filled 2018-08-18: qty 2

## 2018-08-18 MED ORDER — VERAPAMIL HCL 2.5 MG/ML IV SOLN
INTRAVENOUS | Status: AC
  Filled 2018-08-18: qty 2

## 2018-08-18 MED ORDER — VERAPAMIL HCL 2.5 MG/ML IV SOLN
INTRAVENOUS | Status: DC | PRN
  Administered 2018-08-18: 10 mL via INTRA_ARTERIAL

## 2018-08-18 MED ORDER — WARFARIN SODIUM 5 MG PO TABS: ORAL_TABLET | ORAL | 1 refills | Status: DC

## 2018-08-18 MED ORDER — HEPARIN (PORCINE) IN NACL 1000-0.9 UT/500ML-% IV SOLN
INTRAVENOUS | Status: AC
  Filled 2018-08-18: qty 1000

## 2018-08-18 MED ORDER — ASPIRIN 81 MG PO CHEW: 81.0000 mg | CHEWABLE_TABLET | ORAL | Status: DC

## 2018-08-18 MED ORDER — HYDRALAZINE HCL 20 MG/ML IJ SOLN: 10.0000 mg | INTRAMUSCULAR | Status: DC | PRN

## 2018-08-18 MED ORDER — HEPARIN SODIUM (PORCINE) 1000 UNIT/ML IJ SOLN
INTRAMUSCULAR | Status: AC
  Filled 2018-08-18: qty 1

## 2018-08-18 MED ORDER — MIDAZOLAM HCL 2 MG/2ML IJ SOLN
INTRAMUSCULAR | Status: AC
  Filled 2018-08-18: qty 2

## 2018-08-18 MED ORDER — SODIUM CHLORIDE 0.9 % WEIGHT BASED INFUSION
3.0000 mL/kg/h | INTRAVENOUS | Status: AC
  Administered 2018-08-18: 3 mL/kg/h via INTRAVENOUS

## 2018-08-18 MED ORDER — HEPARIN SODIUM (PORCINE) 1000 UNIT/ML IJ SOLN
INTRAMUSCULAR | Status: DC | PRN
  Administered 2018-08-18: 8000 [IU] via INTRAVENOUS

## 2018-08-18 SURGICAL SUPPLY — 14 items
BAG SNAP BAND KOVER 36X36 (MISCELLANEOUS) ×2 IMPLANT
CATH LAUNCHER 6FR AL.75 (CATHETERS) ×1 IMPLANT
CATH LAUNCHER 6FR JR4 (CATHETERS) ×1 IMPLANT
DEVICE RAD COMP TR BAND LRG (VASCULAR PRODUCTS) ×1 IMPLANT
GLIDESHEATH SLEND A-KIT 6F 22G (SHEATH) ×1 IMPLANT
GUIDEWIRE INQWIRE 1.5J.035X260 (WIRE) IMPLANT
INQWIRE 1.5J .035X260CM (WIRE) ×2
KIT HEART LEFT (KITS) ×2 IMPLANT
KIT HEMO VALVE WATCHDOG (MISCELLANEOUS) ×1 IMPLANT
PACK CARDIAC CATHETERIZATION (CUSTOM PROCEDURE TRAY) ×2 IMPLANT
SHEATH PROBE COVER 6X72 (BAG) ×1 IMPLANT
TRANSDUCER W/STOPCOCK (MISCELLANEOUS) ×2 IMPLANT
TUBING CIL FLEX 10 FLL-RA (TUBING) ×2 IMPLANT
WIRE ASAHI PROWATER 180CM (WIRE) ×1 IMPLANT

## 2018-08-18 NOTE — Discharge Instructions (Signed)
Radial Site Care ° °This sheet gives you information about how to care for yourself after your procedure. Your health care provider may also give you more specific instructions. If you have problems or questions, contact your health care provider. °What can I expect after the procedure? °After the procedure, it is common to have: °· Bruising and tenderness at the catheter insertion area. °Follow these instructions at home: °Medicines °· Take over-the-counter and prescription medicines only as told by your health care provider. °Insertion site care °· Follow instructions from your health care provider about how to take care of your insertion site. Make sure you: °? Wash your hands with soap and water before you change your bandage (dressing). If soap and water are not available, use hand sanitizer. °? Change your dressing as told by your health care provider. °? Leave stitches (sutures), skin glue, or adhesive strips in place. These skin closures may need to stay in place for 2 weeks or longer. If adhesive strip edges start to loosen and curl up, you may trim the loose edges. Do not remove adhesive strips completely unless your health care provider tells you to do that. °· Check your insertion site every day for signs of infection. Check for: °? Redness, swelling, or pain. °? Fluid or blood. °? Pus or a bad smell. °? Warmth. °· Do not take baths, swim, or use a hot tub until your health care provider approves. °· You may shower 24-48 hours after the procedure, or as directed by your health care provider. °? Remove the dressing and gently wash the site with plain soap and water. °? Pat the area dry with a clean towel. °? Do not rub the site. That could cause bleeding. °· Do not apply powder or lotion to the site. °Activity ° °· For 24 hours after the procedure, or as directed by your health care provider: °? Do not flex or bend the affected arm. °? Do not push or pull heavy objects with the affected arm. °? Do not  drive yourself home from the hospital or clinic. You may drive 24 hours after the procedure unless your health care provider tells you not to. °? Do not operate machinery or power tools. °· Do not lift anything that is heavier than 10 lb (4.5 kg), or the limit that you are told, until your health care provider says that it is safe. °· Ask your health care provider when it is okay to: °? Return to work or school. °? Resume usual physical activities or sports. °? Resume sexual activity. °General instructions °· If the catheter site starts to bleed, raise your arm and put firm pressure on the site. If the bleeding does not stop, get help right away. This is a medical emergency. °· If you went home on the same day as your procedure, a responsible adult should be with you for the first 24 hours after you arrive home. °· Keep all follow-up visits as told by your health care provider. This is important. °Contact a health care provider if: °· You have a fever. °· You have redness, swelling, or yellow drainage around your insertion site. °Get help right away if: °· You have unusual pain at the radial site. °· The catheter insertion area swells very fast. °· The insertion area is bleeding, and the bleeding does not stop when you hold steady pressure on the area. °· Your arm or hand becomes pale, cool, tingly, or numb. °These symptoms may represent a serious problem   that is an emergency. Do not wait to see if the symptoms will go away. Get medical help right away. Call your local emergency services (911 in the [place] Do not drive yourself to the hospital. °Summary °· After the procedure, it is common to have bruising and tenderness at the site. °· Follow instructions from your health care provider about how to take care of your radial site wound. Check the wound every day for signs of infection. °· Do not lift anything that is heavier than 10 lb (4.5 kg), or the limit that you are told, until your health care provider says  that it is safe. °This information is not intended to replace advice given to you by your health care provider. Make sure you discuss any questions you have with your health care provider. °Document Released: 03/09/2010 Document Revised: 03/12/2017 Document Reviewed: 03/12/2017 °Elsevier Patient Education © 2020 [employer] ° °

## 2018-08-18 NOTE — H&P (Signed)
Subjective:   GODDESS DERR, female    DOB: 1961/01/05, 58 y.o.   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  Patient was supposed to undro PCI to prox RCA and ostial SVG-dRCA in early june due to worsening angina symptoms. However, this was kept on hold due to hematuria. Patient saw urology evaluation, and has not had any recurrence hematuria since then. She continues to have exertional chest pressure. Leg edema has improved. INR is 2.2 today.        Past Medical History:  Diagnosis Date  . Hyperlipidemia   . Hypertension   . Rheumatic heart disease    MS/ AS          Past Surgical History:  Procedure Laterality Date  . CHOLECYSTECTOMY    . LEG SURGERY     "metal plate in leg"  . RIGHT HEART CATH AND CORONARY/GRAFT ANGIOGRAPHY N/A 07/21/2018   Procedure: RIGHT HEART CATH AND CORONARY/GRAFT ANGIOGRAPHY;  Surgeon: Elder Negus, MD;  Location: [facility] INVASIVE CV LAB;  Service: Cardiovascular;  Laterality: N/A;  . TEE WITHOUT CARDIOVERSION N/A 07/27/2015   Procedure: TRANSESOPHAGEAL ECHOCARDIOGRAM (TEE);  Surgeon: Thurmon Fair, MD;  Location: [facility] ENDOSCOPY;  Service: Cardiovascular;  Laterality: N/A;  . TEE WITHOUT CARDIOVERSION N/A 07/21/2018   Procedure: TRANSESOPHAGEAL ECHOCARDIOGRAM (TEE);  Surgeon: Elder Negus, MD;  Location: [facility] ENDOSCOPY;  Service: Cardiovascular;  Laterality: N/A;  . TONSILLECTOMY AND ADENOIDECTOMY       Social History        Socioeconomic History  . Marital status: Married    Spouse name: Not on file  . Number of children: 4  . Years of education: Not on file  . Highest education level: Not on file  Occupational History  . Not on  file  Social Needs  . Financial resource strain: Not on file  . Food insecurity    Worry: Not on file    Inability: Not on file  . Transportation needs    Medical: Not on file    Non-medical: Not on file  Tobacco Use  . Smoking status: Former Smoker    Packs/day: 1.50    Years: 40.00    Pack years: 60.00    Types: Cigarettes    Quit date: 06/04/2017    Years since quitting: 1.1  . Smokeless tobacco: Never Used  Substance and Sexual Activity  . Alcohol use: Not Currently  . Drug use: No  . Sexual activity: Not on file  Lifestyle  . Physical activity    Days per week: Not on file    Minutes per session: Not on file  . Stress: Not on file  Relationships  . Social Musician on phone: Not on file    Gets together: Not on file    Attends religious service: Not on file    Active member of club or organization: Not on file    Attends meetings of clubs or organizations: Not on file    Relationship status: Not on file  . Intimate partner violence    Fear of current or ex partner: Not on file    Emotionally abused: Not on file    Physically abused: Not on file    Forced  sexual activity: Not on file  Other Topics Concern  . Not on file  Social History Narrative   Epworth Sleepiness Scale = 4 (as of 06/28/2015)          Family History  Problem Relation Age of Onset  . Cancer Mother   . Hypertension Mother   . Diabetes Mother   . Hyperlipidemia Mother   . Heart disease Mother   . Cancer Father   . Hypertension Father   . Diabetes Father   . Heart disease Father   . Hypertension Sister   . Hypertension Brother   . Hypertension Brother            Current Outpatient Medications on File Prior to Visit  Medication Sig Dispense Refill  . acetaminophen (TYLENOL) 500 MG tablet Take 500 mg by mouth every 6 (six) hours as needed (for pain.).    Marland Kitchen albuterol (VENTOLIN HFA) 108 (90 Base) MCG/ACT inhaler  Inhale 2 puffs into the lungs every 6 (six) hours as needed for wheezing or shortness of breath.     Marland Kitchen aspirin EC 81 MG tablet Take 81 mg by mouth daily.     . busPIRone (BUSPAR) 5 MG tablet daily.    . citalopram (CELEXA) 40 MG tablet Take 20 mg by mouth 2 (two) times a day.    . clonazePAM (KLONOPIN) 0.5 MG tablet Take 0.5 mg by mouth at bedtime.     . clopidogrel (PLAVIX) 75 MG tablet Take 1 tablet (75 mg total) by mouth daily. Take 4 pills on day 1. Then take 1 pill daily. 90 tablet 3  . enoxaparin (LOVENOX) 100 MG/ML injection Inject 1 mL (100 mg total) into the skin every 12 (twelve) hours. 60 mL 11  . fluticasone (FLOVENT DISKUS) 50 MCG/BLIST diskus inhaler Inhale 1 puff into the lungs daily.     . furosemide (LASIX) 20 MG tablet Take 1 tablet (20 mg total) by mouth as needed. For shortness of breath or leg edema (Patient taking differently: Take 20 mg by mouth daily. ) 30 tablet 1  . isosorbide mononitrate (IMDUR) 60 MG 24 hr tablet Take 1 tablet (60 mg total) by mouth daily. 90 tablet 3  . losartan (COZAAR) 50 MG tablet TAKE 1 TABLET BY MOUTH EVERY DAY (Patient taking differently: Take 50 mg by mouth daily. ) 30 tablet 6  . metoprolol tartrate (LOPRESSOR) 100 MG tablet Take 100 mg by mouth 2 (two) times a day.    . nitroGLYCERIN (NITROSTAT) 0.4 MG SL tablet Place 0.4 mg under the tongue every 5 (five) minutes x 3 doses as needed for chest pain.     . pantoprazole (PROTONIX) 40 MG tablet Take 1 tablet (40 mg total) by mouth daily. 30 tablet 1  . rosuvastatin (CRESTOR) 40 MG tablet Take 1 tablet (40 mg total) by mouth daily. 90 tablet 1  . spironolactone (ALDACTONE) 50 MG tablet Take 1 tablet (50 mg total) by mouth daily. 30 tablet 3   No current facility-administered medications on file prior to visit.     Cardiovascular studies:  EKG 02/19/2018: Probable sinus rhythm 76 bpm. Poor R wave progression  Lexiscan sestamibi stress test 02/27/2018: 1. Lexiscan stress  test with low level exercise was performed. Exercise capacity was not assessed. No stress symptoms reported. Peak effect blood pressure was 220/86 mmHg. The resting electrocardiogram demonstrated normal sinus rhythm, normal resting conduction, no resting arrhythmias and normal rest repolarization. Stress EKG is non diagnostic for ischemia as it is a  pharmacologic stress. In addition, the stress electrocardiogram showed sinus tachycardia, normal stress conduction, occasional PVC, and normal stress repolarization.  2. The overall quality of the study is good. Left ventricular cavity is noted to be normal on the rest and stress studies. Gated SPECT images reveal normal myocardial thickening and wall motion. The left ventricular ejection fraction was calculated or visually estimated to be 61%. Medium sized, mild intensity, partially reversible perfusion defect in mid to apical inferior/inferolateral myocardium. 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  3. Intermediate risk study.  Echocardiogram 02/26/2018: Left ventricle cavity is normal in size. Mild concentric hypertrophy of the left ventricle. Abnormal septal wall motion due to post-operative valve. Diastolic function could not be assessed due to post op valve and CABG status. Calculated EF 63%. Left atrial cavity is moderate to severely dilated measures 4.5 cm in long axis. Right atrial cavity is mildly dilated. Mechanical aortic valve with trace regurgitation. Mild aortic valve leaflet calcification. Mildly restricted aortic valve leaflets. Mild to moderate aortic valve stenosis. Aortic valve peak pressure gradient of 43 and mean gradient of 21 mmHg, calculated aortic valve area 0.88 cm. Mechanical mitral valve with trace regurgitation. Moderately restricted mitral valve leaflets. Mild mitral valve stenosis. Mitral valve peak pressure gradient of 26 and mean gradient of 6.2 mmHg, calculated mitral  valve area 1.9 cm. Mild to moderate tricuspid regurgitation. Mild pulmonary hypertension. PA systolic pressure estimated at 39 mm Hg. Compared to the study done on 08/06/2017, no significant change.  Echocardiogram 08/06/2017: Left ventricle cavity is normal in size. Moderate concentric hypertrophy of the left ventricle. Normal global wall motion. Indeterminate diastolic filling pattern. Calculated EF 63%. Mechanical aortic valve with trace regurgitation. Mild pannus formation on the mechanical AV. Mildly restricted aortic valve leaflets. Mild to moderate aortic valve stenosis. Aortic valve peak pressure gradient of 41 and mean gradient of 22 mmHg, calculated aortic valve area 1.0 cm. Mechanical mitral valve with trace regurgitation. Mild pannus on the mechanical MV. Mildly restricted mitral valve leaflets. Mitral valve peak pressure gradient of 14 and mean gradient of 4 mmHg, calculated mitral valve area 1.8 cm. Mild to moderate tricuspid regurgitation. Mild pulmonary hypertension.   Recent labs: Labs 02/16/2018: BNP 251 elevated  Labs 07/02/2017: A1c 5.7%, HB 12.9/HCT 38.6, platelets 220, normal indicis.  Serum glucose 90 mg, BUN 23, creatinine 0.75, CMP normal.  Potassium 4.6.  TSH normal.  Review of Systems  Constitution: Negative for decreased appetite, malaise/fatigue, weight gain and weight loss.  HENT: Negative for congestion.   Eyes: Negative for visual disturbance.  Cardiovascular: Positive for chest pain and dyspnea on exertion. Negative for leg swelling, palpitations and syncope.  Respiratory: Positive for shortness of breath.   Endocrine: Negative for cold intolerance.  Hematologic/Lymphatic: Does not bruise/bleed easily.  Skin: Negative for itching and rash.  Musculoskeletal: Negative for myalgias.  Gastrointestinal: Negative for abdominal pain, nausea and vomiting.  Genitourinary: Negative for dysuria.  Neurological: Negative for dizziness and weakness.   Psychiatric/Behavioral: The patient is not nervous/anxious.   All other systems reviewed and are negative.           Vitals:   08/14/18 1255  BP: (!) 147/67  Pulse: (!) 56  SpO2: 97%    Objective:   Objective     Physical Exam  Constitutional: She is oriented to person, place, and time. She appears well-developed and well-nourished. No distress.  HENT:  Head: Normocephalic and atraumatic.  Eyes: Pupils are equal, round, and reactive  to light. Conjunctivae are normal.  Neck: No JVD present.  Cardiovascular: Normal rate, regular rhythm and intact distal pulses.  Murmur heard.  Harsh midsystolic murmur is present with a grade of 2/6 at the upper right sternal border radiating to the neck. Crisp metallic S1 & S2. No diastolic murmur heard.  Pulmonary/Chest: Effort normal and breath sounds normal. She has no wheezes. She has no rales.  Abdominal: Soft. Bowel sounds are normal. There is no rebound.  Musculoskeletal:        General: No edema.  Lymphadenopathy:    She has no cervical adenopathy.  Neurological: She is alert and oriented to person, place, and time. No cranial nerve deficit.  Skin: Skin is warm and dry.  Psychiatric: She has a normal mood and affect.  Nursing note and vitals reviewed.        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   CAD of native and bypass grafts with angina, S/p MVR, AVR, subtherapeutic INR: 70-80% stenoses in pRCA and SVG-dRCA. Given her class II-III angina symptoms, recommend elective PCI to pRCA and SVG-RCA.  Her hematuria has cleared up. Recommend the following regarding antiplatelet/anticoagulation  management.  Take last dose of warfarin on 08/15/2018. Start Plavix on 06/28 with 4 pills on that day, then take 1 pill everyday. Continue Aspirin and lovenox uninterrupted. Get COVID test done today.  Elective stenting on Tuesday 06/30 through right radial approach. She will also need preparation for contrast dye allergy.     Hypertension: Much better controlled.    INR check in 10 days.   Elder Negus, MD [facility]. [place] Pager: [phone] Office: [phone] If no answer Cell [phone]

## 2018-08-18 NOTE — Interval H&P Note (Signed)
History and Physical Interval Note:  08/18/2018 10:11 AM  Kimberly Hobbs  has presented today for surgery, with the diagnosis of Angina.  The various methods of treatment have been discussed with the patient and family. After consideration of risks, benefits and other options for treatment, the patient has consented to  Procedure(s): CORONARY STENT INTERVENTION (N/A) as a surgical intervention.  The patient's history has been reviewed, patient examined, no change in status, stable for surgery.  I have reviewed the patient's chart and labs.  Questions were answered to the patient's satisfaction.      2016/2017 Appropriate Use Criteria for Coronary Revascularization Clinical Presentation: Diabetes Mellitus? Symptom Status? S/P CABG? Antianginal Therapy (# of long-acting drugs)? Results of Non-invasive testing? FFR/iFR results in all diseased vessels? Patient undergoing renal transplant? Patient undergoing percutaneous valve procedure (TAVR, MitraClip, Others)? Symptom Status:  Ischemic Symptoms  Non-invasive Testing:  Intermediate Risk  If no or indeterminate stress test, FFR/iFR results in all diseased vessels:  N/A  Diabetes Mellitus:  No  S/P CABG:  Yes  Antianginal therapy (number of long-acting drugs):  >=2  Patient undergoing renal transplant:  No  Patient undergoing percutaneous valve procedure:  No     LIMA-LAD patent and without significant stenoses PCI CABG  Stenosis supplying 1 territory (bypass graft or native artery) other than anterior A (8); Indication 49 M (5); Indication 31   Stenoses supplying 2 territories (bypass graft or native artery, either 2 separate vessels or sequential graft supplying 2 territories) not including anterior territory A (8); Indication 88 M (6); Indication 34   newline LIMA-LAD not patent  Stenosis supplying 1 territory (bypass graft or native artery)-anterior (LAD) territory A (8); Indication 74 M (6); Indication 36   Stenoses supplying 2  territories (bypass graft or native artery, either 2 separate vessels or sequential graft supplying 2 territories)-LAD plus other territory A (8); Indication 39 A (8); Indication 39   Stenoses supplying 3 territories (bypass graft or native arteries, separate vessels, sequential grafts, or combination thereof)-LAD plus 2 other territories A (8); Indication 41 A (8); Indication 41     Notes:  A indicates appropriate. M indicates may be appropriate. R indicates rarely appropriate. Number in parentheses is median score for that indication. Reclassify indicates number of functionally diseased vessels should be decreased given negative FFR/iFR. Re-evaluate the scenario interpreting any FFR/iFR negative vessel as being not significantly stenosed.  Disease means involved vessel provides flow to a sufficient amount of myocardium to be clinically important.  If FFR testing indicates a vessel is not significant, that vessel should not be considered diseased (and the patient should be reclassified with respect to extent of functionally significant disease).  Proximal LAD + proximal left dominant LCX is considered 3 vessel CAD  2 Vessel CAD with FFR/iFR abnormal in only 1 but not both is considered 1 vessel CAD  See Table B for risk stratification based on noninvasive testing   Journal of the [employer] of Cardiology Mar 2017, 23391; DOI: 10.1016/[email or web address] [email or web address].full-[email or web address]   [employer]

## 2018-08-19 ENCOUNTER — Encounter (HOSPITAL_COMMUNITY): Payer: Self-pay | Admitting: Cardiology

## 2018-08-19 MED FILL — Nitroglycerin IV Soln 100 MCG/ML in D5W: INTRA_ARTERIAL | Qty: 10 | Status: AC

## 2018-08-24 ENCOUNTER — Encounter: Payer: Self-pay | Admitting: Cardiology

## 2018-08-24 ENCOUNTER — Ambulatory Visit (INDEPENDENT_AMBULATORY_CARE_PROVIDER_SITE_OTHER): Payer: 59 | Admitting: Cardiology

## 2018-08-24 ENCOUNTER — Other Ambulatory Visit: Payer: Self-pay

## 2018-08-24 VITALS — BP 144/53 | HR 60 | Ht 65.0 in | Wt 230.9 lb

## 2018-08-24 DIAGNOSIS — I351 Nonrheumatic aortic (valve) insufficiency: Secondary | ICD-10-CM

## 2018-08-24 DIAGNOSIS — I25708 Atherosclerosis of coronary artery bypass graft(s), unspecified, with other forms of angina pectoris: Secondary | ICD-10-CM

## 2018-08-24 DIAGNOSIS — I340 Nonrheumatic mitral (valve) insufficiency: Secondary | ICD-10-CM | POA: Diagnosis not present

## 2018-08-24 DIAGNOSIS — Z7901 Long term (current) use of anticoagulants: Secondary | ICD-10-CM

## 2018-08-24 LAB — POCT INR: INR: 2.3 (ref 2.0–3.0)

## 2018-08-24 MED ORDER — WARFARIN SODIUM 7.5 MG PO TABS: ORAL_TABLET | ORAL | 1 refills | Status: DC

## 2018-08-24 NOTE — Progress Notes (Signed)
Subjective:   Kimberly Hobbs, female    DOB: [birth year], 58 y.o.   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  Patient is doing well without any over chest pain or dyspnea symptoms at this time. INR is 2.3 today.    Past Medical History:  Diagnosis Date   Hyperlipidemia    Hypertension    Rheumatic heart disease    MS/ AS     Past Surgical History:  Procedure Laterality Date   CHOLECYSTECTOMY     CORONARY/GRAFT ANGIOGRAPHY N/A 08/18/2018   Procedure: CORONARY/GRAFT ANGIOGRAPHY;  Surgeon: Nigel Mormon, MD;  Location: [place] [facility];  Service: Cardiovascular;  Laterality: N/A;   LEG SURGERY     "metal plate in leg"   RIGHT HEART CATH AND CORONARY/GRAFT ANGIOGRAPHY N/A 07/21/2018   Procedure: RIGHT HEART CATH AND CORONARY/GRAFT ANGIOGRAPHY;  Surgeon: Nigel Mormon, MD;  Location: [place] [facility];  Service: Cardiovascular;  Laterality: N/A;   TEE WITHOUT CARDIOVERSION N/A 07/27/2015   Procedure: TRANSESOPHAGEAL ECHOCARDIOGRAM (TEE);  Surgeon: Sanda Klein, MD;  Location: [facility] ENDOSCOPY;  Service: Cardiovascular;  Laterality: N/A;   TEE WITHOUT CARDIOVERSION N/A 07/21/2018   Procedure: TRANSESOPHAGEAL ECHOCARDIOGRAM (TEE);  Surgeon: Nigel Mormon, MD;  Location: [facility] ENDOSCOPY;  Service: Cardiovascular;  Laterality: N/A;   TONSILLECTOMY AND ADENOIDECTOMY       Social History   Socioeconomic History   Marital status: Married    Spouse name: Not on file   Number of children: 4   Years of education: Not on file   Highest education level: Not on file  Occupational History   Not on file  Social Needs   Financial resource strain: Not on file   Food insecurity     Worry: Not on file    Inability: Not on file   Transportation needs    Medical: Not on file    Non-medical: Not on file  Tobacco Use   Smoking status: Former Smoker    Packs/day: 1.50    Years: 40.00    Pack years: 60.00    Types: Cigarettes    Quit date: 06/04/2017    Years since quitting: 1.2   Smokeless tobacco: Never Used  Substance and Sexual Activity   Alcohol use: Not Currently   Drug use: No   Sexual activity: Not on file  Lifestyle   Physical activity    Days per week: Not on file    Minutes per session: Not on file   Stress: Not on file  Relationships   Social connections    Talks on phone: Not on file    Gets together: Not on file    Attends religious service: Not on file    Active member of club or organization: Not on file    Attends meetings of clubs or organizations: Not on file    Relationship status: Not on file   Intimate partner violence    Fear of current or ex partner: Not on file    Emotionally abused: Not on file    Physically abused: Not on file    Forced sexual activity: Not on file  Other Topics Concern   Not on file  Social History Narrative   Epworth Sleepiness Scale =  4 (as of 06/28/2015)     Family History  Problem Relation Age of Onset   Cancer Mother    Hypertension Mother    Diabetes Mother    Hyperlipidemia Mother    Heart disease Mother    Cancer Father    Hypertension Father    Diabetes Father    Heart disease Father    Hypertension Sister    Hypertension Brother    Hypertension Brother      Current Outpatient Medications on File Prior to Visit  Medication Sig Dispense Refill   acetaminophen (TYLENOL) 500 MG tablet Take 500 mg by mouth every 6 (six) hours as needed (for pain.).     albuterol (VENTOLIN HFA) 108 (90 Base) MCG/ACT inhaler Inhale 2 puffs into the lungs every 6 (six) hours as needed for wheezing or shortness of breath.      aspirin EC 81 MG tablet Take 81 mg by mouth daily.       busPIRone (BUSPAR) 5 MG tablet daily.     citalopram (CELEXA) 40 MG tablet Take 20 mg by mouth 2 (two) times a day.     clonazePAM (KLONOPIN) 0.5 MG tablet Take 0.5 mg by mouth at bedtime.      enoxaparin (LOVENOX) 100 MG/ML injection Inject 1 mL (100 mg total) into the skin every 12 (twelve) hours. 30 mL 1   fluticasone (FLOVENT DISKUS) 50 MCG/BLIST diskus inhaler Inhale 1 puff into the lungs daily.      furosemide (LASIX) 20 MG tablet Take 1 tablet (20 mg total) by mouth as needed. For shortness of breath or leg edema (Patient taking differently: Take 20 mg by mouth daily. ) 30 tablet 1   isosorbide mononitrate (IMDUR) 60 MG 24 hr tablet Take 1 tablet (60 mg total) by mouth daily. 90 tablet 3   losartan (COZAAR) 50 MG tablet TAKE 1 TABLET BY MOUTH EVERY DAY (Patient taking differently: Take 50 mg by mouth daily. ) 30 tablet 6   metoprolol tartrate (LOPRESSOR) 100 MG tablet Take 100 mg by mouth 2 (two) times a day.     nitroGLYCERIN (NITROSTAT) 0.4 MG SL tablet Place 0.4 mg under the tongue every 5 (five) minutes x 3 doses as needed for chest pain.      pantoprazole (PROTONIX) 40 MG tablet Take 1 tablet (40 mg total) by mouth daily. 30 tablet 1   rosuvastatin (CRESTOR) 40 MG tablet Take 1 tablet (40 mg total) by mouth daily. 90 tablet 1   spironolactone (ALDACTONE) 50 MG tablet Take 1 tablet (50 mg total) by mouth daily. 30 tablet 3   warfarin (COUMADIN) 5 MG tablet Take 2 pills daily from 06/30-07/04. Then, take 1.5 pills daily until next office visit on 07/08 60 tablet 1   No current facility-administered medications on file prior to visit.     Cardiovascular studies:  Coronary and bypass graft angiography 07/21/2018 and 08/18/2018: LM: Normal LAD: 100% mid occlusion. Mild distal disease LIMA-LAD: Patent LCx: Normal RCA: Prox 60% stenosis, mid 30-40% stenoses. TIMI III flow in prox-mid RCA. 100% distal occlusion SVG-RCA: Patent. Ostial 40%. Distal RCA moderate diffuse  disease.   Guide catheter angiography provided superior images. There is TIMI III flow in SVG which fills RCA all the way to the ostium. Even is she were to have FFR positive lesion in the graft, I do not think the benefits of a stent would outweigh the risk of losing the graft and the entire RCA territory circulation in a borderline lesion.  Also, the prox RCA lesion is well bypassed by the SVG graft. Thus, I decided not to perform FFR/PCI to either of these lesions. Continue medical management.   EKG 02/19/2018: Probable sinus rhythm 76 bpm. Poor R wave progression  Lexiscan sestamibi stress test 02/27/2018: 1. Lexiscan stress test with low level exercise was performed. Exercise capacity was not assessed. No stress symptoms reported. Peak effect blood pressure was 220/86 mmHg. The resting electrocardiogram demonstrated normal sinus rhythm, normal resting conduction, no resting arrhythmias and normal rest repolarization.  Stress EKG is non diagnostic for ischemia as it is a pharmacologic stress. In addition, the stress electrocardiogram showed sinus tachycardia, normal stress conduction, occasional PVC, and normal stress repolarization.   2. The overall quality of the study is good.  Left ventricular cavity is noted to be normal on the rest and stress studies.  Gated SPECT images reveal normal myocardial thickening and wall motion.  The left ventricular ejection fraction was calculated or visually estimated to be 61%. Medium sized, mild intensity, partially reversible perfusion defect in mid to apical  inferior/inferolateral myocardium. 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  3. Intermediate risk study.  Echocardiogram 02/26/2018: Left ventricle cavity is normal in size. Mild concentric hypertrophy of the left ventricle. Abnormal septal wall motion due to post-operative valve. Diastolic function could not be assessed due to post op valve and CABG status.   Calculated EF 63%. Left atrial cavity is moderate to severely dilated measures 4.5 cm in long axis. Right atrial cavity is mildly dilated. Mechanical aortic valve with trace regurgitation. Mild aortic valve leaflet calcification. Mildly restricted aortic valve leaflets. Mild to moderate aortic valve stenosis. Aortic valve peak pressure gradient of  43 and mean gradient of 21 mmHg, calculated aortic valve area    0.88 cm. Mechanical mitral valve with trace regurgitation. Moderately restricted mitral valve leaflets. Mild mitral valve stenosis. Mitral valve peak pressure gradient of  26  and mean gradient of  6.2  mmHg, calculated mitral valve area 1.9   cm. Mild to moderate tricuspid regurgitation. Mild pulmonary hypertension. PA systolic pressure estimated at 39 mm Hg. Compared to the study done on 08/06/2017, no significant change.  Echocardiogram 08/06/2017: Left ventricle cavity is normal in size. Moderate concentric hypertrophy of the left ventricle. Normal global wall motion. Indeterminate diastolic filling pattern. Calculated EF 63%. Mechanical aortic valve with trace regurgitation. Mild pannus formation on the mechanical AV. Mildly restricted aortic valve leaflets. Mild to moderate aortic valve stenosis. Aortic valve peak pressure gradient of 41 and mean gradient of 22 mmHg, calculated aortic valve area   1.0 cm. Mechanical mitral valve with trace regurgitation. Mild pannus on the mechanical MV. Mildly restricted mitral valve leaflets. Mitral valve peak pressure gradient of 14 and mean gradient of 4 mmHg, calculated mitral valve area 1.8 cm. Mild to moderate tricuspid regurgitation. Mild pulmonary hypertension.   Recent labs: Labs 02/16/2018: BNP 251 elevated  Labs 07/02/2017: A1c 5.7%, HB 12.9/HCT 38.6, platelets 220, normal indicis.  Serum glucose 90 mg, BUN 23, creatinine 0.75, CMP normal.  Potassium 4.6.  TSH normal.  Review of Systems  Constitution: Negative for decreased  appetite, malaise/fatigue, weight gain and weight loss.  HENT: Negative for congestion.   Eyes: Negative for visual disturbance.  Cardiovascular: Positive for chest pain and dyspnea on exertion. Negative for leg swelling, palpitations and syncope.  Respiratory: Positive for shortness of breath.   Endocrine: Negative for cold intolerance.  Hematologic/Lymphatic: Does not bruise/bleed easily.  Skin: Negative for itching and rash.  Musculoskeletal: Negative for myalgias.  Gastrointestinal: Negative for abdominal pain, nausea and vomiting.  Genitourinary: Negative for dysuria.  Neurological: Negative for dizziness and weakness.  Psychiatric/Behavioral: The patient is not nervous/anxious.   All other systems reviewed and are negative.        Vitals:   08/24/18 1426  BP: (!) 144/53  Pulse: 60  SpO2: 94%    Objective:     Physical Exam  Constitutional: She is oriented to person, place, and time. She appears well-developed and well-nourished. No distress.  HENT:  Head: Normocephalic and atraumatic.  Eyes: Pupils are equal, round, and reactive to light. Conjunctivae are normal.  Neck: No JVD present.  Cardiovascular: Normal rate, regular rhythm and intact distal pulses.  Murmur heard.  Harsh midsystolic murmur is present with a grade of 2/6 at the upper right sternal border radiating to the neck. Crisp metallic S1 & S2. No diastolic murmur heard.  Pulmonary/Chest: Effort normal and breath sounds normal. She has no wheezes. She has no rales.  Abdominal: Soft. Bowel sounds are normal. There is no rebound.  Musculoskeletal:        General: No edema.  Lymphadenopathy:    She has no cervical adenopathy.  Neurological: She is alert and oriented to person, place, and time. No cranial nerve deficit.  Skin: Skin is warm and dry.  Psychiatric: She has a normal mood and affect.  Nursing note and vitals reviewed.        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   CAD of native and bypass grafts with angina, S/p MVR, AVR, subtherapeutic INR: Patent grafts with non-critical disease. Continue medical management with Aspirin, Imdur 60 mg daiy, losartan 50 mg daily, metoprolol tartarate 100 mg bid, crestor 40 mg daily.  S/p MVR, AVR: INR 2.3 today. Okay to stop lovenox. COnitnue warfarin 7.5 mg daily. Repeat INR check 07/16.  Hypertension: Much better controlled.     Nigel Mormon, MD [facility] [place] Pager: [phone] Office: [phone] If no answer Cell [phone]

## 2018-08-26 ENCOUNTER — Ambulatory Visit: Payer: 59 | Admitting: Cardiology

## 2018-08-31 ENCOUNTER — Ambulatory Visit: Payer: 59 | Admitting: Cardiology

## 2018-09-03 ENCOUNTER — Other Ambulatory Visit: Payer: Self-pay

## 2018-09-03 ENCOUNTER — Ambulatory Visit (INDEPENDENT_AMBULATORY_CARE_PROVIDER_SITE_OTHER): Payer: 59 | Admitting: Cardiology

## 2018-09-03 DIAGNOSIS — Z5181 Encounter for therapeutic drug level monitoring: Secondary | ICD-10-CM | POA: Diagnosis not present

## 2018-09-03 DIAGNOSIS — Z952 Presence of prosthetic heart valve: Secondary | ICD-10-CM

## 2018-09-03 DIAGNOSIS — Z7901 Long term (current) use of anticoagulants: Secondary | ICD-10-CM | POA: Diagnosis not present

## 2018-09-03 LAB — POCT INR: INR: 2.1 (ref 2.0–3.0)

## 2018-09-03 NOTE — Progress Notes (Signed)
10 mg 3 days. F/u INR next week.

## 2018-09-07 ENCOUNTER — Ambulatory Visit (INDEPENDENT_AMBULATORY_CARE_PROVIDER_SITE_OTHER): Payer: 59 | Admitting: Cardiology

## 2018-09-07 ENCOUNTER — Other Ambulatory Visit: Payer: Self-pay

## 2018-09-07 DIAGNOSIS — Z5181 Encounter for therapeutic drug level monitoring: Secondary | ICD-10-CM

## 2018-09-07 DIAGNOSIS — I061 Rheumatic aortic insufficiency: Secondary | ICD-10-CM

## 2018-09-07 DIAGNOSIS — I051 Rheumatic mitral insufficiency: Secondary | ICD-10-CM | POA: Diagnosis not present

## 2018-09-07 DIAGNOSIS — Z7901 Long term (current) use of anticoagulants: Secondary | ICD-10-CM | POA: Diagnosis not present

## 2018-09-07 LAB — POCT INR: INR: 3.1 — AB (ref 2.0–3.0)

## 2018-09-07 NOTE — Progress Notes (Signed)
Warfarin 10 mg MWF 7.5 mg all other days F/u INR check on 07/30

## 2018-09-17 ENCOUNTER — Other Ambulatory Visit: Payer: Self-pay

## 2018-09-17 ENCOUNTER — Ambulatory Visit (INDEPENDENT_AMBULATORY_CARE_PROVIDER_SITE_OTHER): Payer: 59 | Admitting: Cardiology

## 2018-09-17 DIAGNOSIS — Z952 Presence of prosthetic heart valve: Secondary | ICD-10-CM | POA: Diagnosis not present

## 2018-09-17 DIAGNOSIS — Z5181 Encounter for therapeutic drug level monitoring: Secondary | ICD-10-CM | POA: Diagnosis not present

## 2018-09-17 DIAGNOSIS — I351 Nonrheumatic aortic (valve) insufficiency: Secondary | ICD-10-CM

## 2018-09-17 DIAGNOSIS — Z7901 Long term (current) use of anticoagulants: Secondary | ICD-10-CM

## 2018-09-17 DIAGNOSIS — I340 Nonrheumatic mitral (valve) insufficiency: Secondary | ICD-10-CM | POA: Diagnosis not present

## 2018-09-17 LAB — POCT INR: INR: 3.8 — AB (ref 2.0–3.0)

## 2018-09-21 NOTE — Progress Notes (Signed)
New maintenance plan:  10 mg every Mon, Thu; 7.5 mg all other days [] No changeStart Over  Maintenance plan weekly total: 57.5 mg4.2% Tablets on hand: 10 mg & 7.5 mg  Total dose from past 7 days: 60 mg

## 2018-09-28 ENCOUNTER — Other Ambulatory Visit: Payer: Self-pay

## 2018-09-28 MED ORDER — PANTOPRAZOLE SODIUM 40 MG PO TBEC: 40.0000 mg | DELAYED_RELEASE_TABLET | Freq: Every day | ORAL | 1 refills | Status: DC

## 2018-10-02 ENCOUNTER — Ambulatory Visit (INDEPENDENT_AMBULATORY_CARE_PROVIDER_SITE_OTHER): Payer: 59 | Admitting: Cardiology

## 2018-10-02 ENCOUNTER — Other Ambulatory Visit: Payer: Self-pay

## 2018-10-02 DIAGNOSIS — Z952 Presence of prosthetic heart valve: Secondary | ICD-10-CM

## 2018-10-02 DIAGNOSIS — Z5181 Encounter for therapeutic drug level monitoring: Secondary | ICD-10-CM | POA: Diagnosis not present

## 2018-10-02 DIAGNOSIS — I340 Nonrheumatic mitral (valve) insufficiency: Secondary | ICD-10-CM

## 2018-10-02 DIAGNOSIS — Z7901 Long term (current) use of anticoagulants: Secondary | ICD-10-CM

## 2018-10-02 LAB — POCT INR: INR: 3.6 — AB (ref 2.0–3.0)

## 2018-10-02 NOTE — Progress Notes (Signed)
INR: 3.6 Goal: 2.5-2.5 Indication: Mechanical mitral and aortic valve replacment Current dose:  10 mg Mon, Thu; 7.5 mg all other days New dose: Same  Next INR check: 10-14 days

## 2018-10-07 ENCOUNTER — Ambulatory Visit: Payer: 59 | Admitting: Cardiology

## 2018-10-13 ENCOUNTER — Other Ambulatory Visit: Payer: Self-pay

## 2018-10-13 ENCOUNTER — Ambulatory Visit (INDEPENDENT_AMBULATORY_CARE_PROVIDER_SITE_OTHER): Payer: 59 | Admitting: Cardiology

## 2018-10-13 DIAGNOSIS — Z5181 Encounter for therapeutic drug level monitoring: Secondary | ICD-10-CM

## 2018-10-13 DIAGNOSIS — Z7901 Long term (current) use of anticoagulants: Secondary | ICD-10-CM

## 2018-10-13 DIAGNOSIS — Z952 Presence of prosthetic heart valve: Secondary | ICD-10-CM

## 2018-10-13 LAB — POCT INR
INR: 2.8 (ref 2.0–3.0)
INR: 2.8 (ref 2.0–3.0)

## 2018-10-15 NOTE — Progress Notes (Signed)
Current maintenance plan:  10 mg every Mon, Thu; 7.5 mg all other days [] No changeStart Over  Maintenance plan weekly total: 57.5 mg Tablets on hand: 10 mg & 7.5 mg  Total dose from past 7 days: 57.5 mg    Continue with current dosage as INR is within range at 2.8. Will repeat in 3 weeks for close monitoring as her INR has been varying.

## 2018-10-27 ENCOUNTER — Other Ambulatory Visit: Payer: Self-pay

## 2018-10-27 ENCOUNTER — Ambulatory Visit (INDEPENDENT_AMBULATORY_CARE_PROVIDER_SITE_OTHER): Payer: 59 | Admitting: Cardiology

## 2018-10-27 DIAGNOSIS — Z5181 Encounter for therapeutic drug level monitoring: Secondary | ICD-10-CM | POA: Diagnosis not present

## 2018-10-27 DIAGNOSIS — Z952 Presence of prosthetic heart valve: Secondary | ICD-10-CM | POA: Diagnosis not present

## 2018-10-27 DIAGNOSIS — Z7901 Long term (current) use of anticoagulants: Secondary | ICD-10-CM

## 2018-10-27 LAB — POCT INR: INR: 3.6 — AB (ref 2.0–3.0)

## 2018-10-27 NOTE — Progress Notes (Signed)
INR today: 3.6 Goal: 2.5-3.5 Indication: Mechanical mitral and aortic valve replacement Current dose: 10 mg Mon, Thu; 7.5 mg all other days New dose: Same Next INR: 10 days

## 2018-11-06 ENCOUNTER — Other Ambulatory Visit: Payer: Self-pay | Admitting: Cardiology

## 2018-11-06 DIAGNOSIS — I25119 Atherosclerotic heart disease of native coronary artery with unspecified angina pectoris: Secondary | ICD-10-CM

## 2018-11-10 ENCOUNTER — Ambulatory Visit (INDEPENDENT_AMBULATORY_CARE_PROVIDER_SITE_OTHER): Payer: 59 | Admitting: Cardiology

## 2018-11-10 ENCOUNTER — Other Ambulatory Visit: Payer: Self-pay

## 2018-11-10 DIAGNOSIS — Z7901 Long term (current) use of anticoagulants: Secondary | ICD-10-CM

## 2018-11-10 DIAGNOSIS — I340 Nonrheumatic mitral (valve) insufficiency: Secondary | ICD-10-CM | POA: Diagnosis not present

## 2018-11-10 DIAGNOSIS — Z5181 Encounter for therapeutic drug level monitoring: Secondary | ICD-10-CM | POA: Diagnosis not present

## 2018-11-10 DIAGNOSIS — Z952 Presence of prosthetic heart valve: Secondary | ICD-10-CM

## 2018-11-10 LAB — POCT INR: INR: 2.5 (ref 2.0–3.0)

## 2018-11-10 NOTE — Progress Notes (Signed)
New maintenance plan:  10 mg every Mon, Thu; 7.5 mg all other days [] No changeStart Over  Maintenance plan weekly total: 57.5 mg Tablets on hand: 10 mg & 7.5 mg  Total dose from past 7 days: 57.5 mg    Previous INR was 3.6 just 2 weeks ago and is now 25 without any dosage changes. Will continue the same and repeat in 2 weeks for continued close monitoring.

## 2018-11-20 ENCOUNTER — Other Ambulatory Visit: Payer: Self-pay

## 2018-11-20 MED ORDER — SPIRONOLACTONE 50 MG PO TABS: 50.0000 mg | ORAL_TABLET | Freq: Every day | ORAL | 3 refills | Status: DC

## 2018-11-25 ENCOUNTER — Ambulatory Visit: Payer: 59 | Admitting: Cardiology

## 2018-11-27 ENCOUNTER — Other Ambulatory Visit: Payer: Self-pay

## 2018-11-27 ENCOUNTER — Ambulatory Visit (INDEPENDENT_AMBULATORY_CARE_PROVIDER_SITE_OTHER): Payer: 59 | Admitting: Cardiology

## 2018-11-27 ENCOUNTER — Encounter: Payer: Self-pay | Admitting: Cardiology

## 2018-11-27 VITALS — BP 128/68 | HR 56 | Temp 97.7°F | Ht 65.0 in | Wt 236.0 lb

## 2018-11-27 DIAGNOSIS — R0600 Dyspnea, unspecified: Secondary | ICD-10-CM

## 2018-11-27 DIAGNOSIS — Z952 Presence of prosthetic heart valve: Secondary | ICD-10-CM | POA: Diagnosis not present

## 2018-11-27 DIAGNOSIS — I340 Nonrheumatic mitral (valve) insufficiency: Secondary | ICD-10-CM | POA: Diagnosis not present

## 2018-11-27 DIAGNOSIS — Z7901 Long term (current) use of anticoagulants: Secondary | ICD-10-CM | POA: Diagnosis not present

## 2018-11-27 DIAGNOSIS — I25708 Atherosclerosis of coronary artery bypass graft(s), unspecified, with other forms of angina pectoris: Secondary | ICD-10-CM

## 2018-11-27 DIAGNOSIS — R0609 Other forms of dyspnea: Secondary | ICD-10-CM

## 2018-11-27 DIAGNOSIS — I10 Essential (primary) hypertension: Secondary | ICD-10-CM

## 2018-11-27 LAB — POCT INR: INR: 3.5 — AB (ref 2.0–3.0)

## 2018-11-27 NOTE — Progress Notes (Signed)
Subjective:   Kimberly Hobbs, female    DOB: 1961/01/12, 58 y.o.   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  Patient has recently undergone cardiac workup, including right and left heart cath, TEE. 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  She continues to have exertional dyspnea, chest pressure, along with cough. She is on optimal medical treatment for CAD. She has longstanding dry cough. She is a former 40 PY smoker. She has not had any formal pulmonary workup in the recent times.    Past Medical History:  Diagnosis Date  . Hyperlipidemia   . Hypertension   . Rheumatic heart disease    MS/ AS     Past Surgical History:  Procedure Laterality Date  . CARDIAC CATHETERIZATION    . CHOLECYSTECTOMY    . CORONARY/GRAFT ANGIOGRAPHY N/A 08/18/2018   Procedure: CORONARY/GRAFT ANGIOGRAPHY;  Surgeon: Elder Negus, MD;  Location: [facility]ASIVE [facility];  Service: Cardiovascular;  Laterality: N/A;  . LEG SURGERY     "metal plate in leg"  . RIGHT HEART CATH AND CORONARY/GRAFT ANGIOGRAPHY N/A 07/21/2018   Procedure: RIGHT HEART CATH AND CORONARY/GRAFT ANGIOGRAPHY;  Surgeon: Elder Negus, MD;  Location: [facility];  Service: Cardiovascular;  Laterality: N/A;  . TEE WITHOUT CARDIOVERSION N/A 07/27/2015   Procedure: TRANSESOPHAGEAL ECHOCARDIOGRAM (TEE);  Surgeon: Thurmon Fair, MD;  Location: [facility] ENDOSCOPY;  Service: Cardiovascular;  Laterality: N/A;  . TEE WITHOUT CARDIOVERSION N/A 07/21/2018   Procedure: TRANSESOPHAGEAL  ECHOCARDIOGRAM (TEE);  Surgeon: Elder Negus, MD;  Location: [facility] ENDOSCOPY;  Service: Cardiovascular;  Laterality: N/A;  . TONSILLECTOMY AND ADENOIDECTOMY       Social History   Socioeconomic History  . Marital status: Married    Spouse name: Not on file  . Number of children: 4  . Years of education: Not on file  . Highest education level: Not on file  Occupational History  . Not on file  Social Needs  . Financial resource strain: Not on file  . Food insecurity    Worry: Not on file    Inability: Not on file  . Transportation needs    Medical: Not on file    Non-medical: Not on file  Tobacco Use  . Smoking status: Former Smoker    Packs/day: 1.50    Years: 40.00    Pack years: 60.00    Types: Cigarettes    Quit date: 12/23/2016    Years since quitting: 1.9  . Smokeless tobacco: Never Used  Substance and Sexual Activity  . Alcohol use: Not Currently  . Drug use: No  . Sexual activity: Not on file  Lifestyle  . Physical activity    Days per week: Not on file    Minutes per session: Not on file  . Stress: Not on file  Relationships  . Social Musician on phone: Not on file    Gets together: Not on file    Attends religious service: Not on file    Active member of club or organization: Not on file    Attends meetings of  clubs or organizations: Not on file    Relationship status: Not on file  . Intimate partner violence    Fear of current or ex partner: Not on file    Emotionally abused: Not on file    Physically abused: Not on file    Forced sexual activity: Not on file  Other Topics Concern  . Not on file  Social History Narrative   Epworth Sleepiness Scale = 4 (as of 06/28/2015)     Family History  Problem Relation Age of Onset  . Cancer Mother   . Hypertension Mother   . Diabetes Mother   . Hyperlipidemia Mother   . Heart disease Mother   . Cancer Father   . Hypertension Father   . Diabetes Father   . Heart disease Father   .  Hypertension Sister   . Hypertension Brother   . Hypertension Brother      Current Outpatient Medications on File Prior to Visit  Medication Sig Dispense Refill  . acetaminophen (TYLENOL) 500 MG tablet Take 500 mg by mouth every 6 (six) hours as needed (for pain.).    Marland Kitchen albuterol (VENTOLIN HFA) 108 (90 Base) MCG/ACT inhaler Inhale 2 puffs into the lungs every 6 (six) hours as needed for wheezing or shortness of breath.     Marland Kitchen aspirin EC 81 MG tablet Take 81 mg by mouth daily.     . busPIRone (BUSPAR) 5 MG tablet daily.    . citalopram (CELEXA) 40 MG tablet Take 20 mg by mouth 2 (two) times a day.    . clonazePAM (KLONOPIN) 0.5 MG tablet Take 0.5 mg by mouth at bedtime.     . fluticasone (FLOVENT DISKUS) 50 MCG/BLIST diskus inhaler Inhale 1 puff into the lungs as needed.     . furosemide (LASIX) 20 MG tablet TAKE 1 TABLET BY MOUTH DAILY 90 tablet 1  . isosorbide mononitrate (IMDUR) 60 MG 24 hr tablet Take 1 tablet (60 mg total) by mouth daily. 90 tablet 3  . losartan (COZAAR) 50 MG tablet TAKE 1 TABLET BY MOUTH EVERY DAY (Patient taking differently: Take 50 mg by mouth daily. ) 30 tablet 6  . metoprolol tartrate (LOPRESSOR) 100 MG tablet Take 100 mg by mouth 2 (two) times a day.    . nitroGLYCERIN (NITROSTAT) 0.4 MG SL tablet Place 0.4 mg under the tongue every 5 (five) minutes x 3 doses as needed for chest pain.     . pantoprazole (PROTONIX) 40 MG tablet Take 1 tablet (40 mg total) by mouth daily. 90 tablet 1  . rosuvastatin (CRESTOR) 40 MG tablet Take 1 tablet (40 mg total) by mouth daily. 90 tablet 1  . spironolactone (ALDACTONE) 50 MG tablet Take 1 tablet (50 mg total) by mouth daily. 30 tablet 3  . warfarin (COUMADIN) 7.5 MG tablet Take every evening 60 tablet 1   No current facility-administered medications on file prior to visit.     Cardiovascular studies:  Coronary and bypass graft angiography 07/21/2018 and 08/18/2018: LM: Normal LAD: 100% mid occlusion. Mild distal  disease LIMA-LAD: Patent LCx: Normal RCA: Prox 60% stenosis, mid 30-40% stenoses. TIMI III flow in prox-mid RCA. 100% distal occlusion SVG-RCA: Patent. Ostial 40%. Distal RCA moderate diffuse disease.   Guide catheter angiography provided superior images. There is TIMI III flow in SVG which fills RCA all the way to the ostium. Even if she were to have FFR positive lesion in the graft, I do not think the benefits of a stent  would outweigh the risk of losing the graft and the entire RCA territory circulation in a borderline lesion. Also, the prox RCA lesion is well bypassed by the SVG graft. Thus, I decided not to perform FFR/PCI to either of these lesions. Continue medical management.   RHC 07/21/2018: #1: RA: 18 mmHg RV 90/12 mmHg PA: 94/40 mmHg. Mean PA 63 mmHg. PW 33 mmHg  Lasix 80 mg  #2: RA 19 mmHg RV 60/0 mmHg PA: 56/16 mmHg. Mean PA 36 mmHg PW: 25 mmHg  CO: 5.4 L/min. CI 2.6 L/min/m2 PVR 2 WU  Impression: Elevated filling pressures Moderate WHO Grp II pulmonary hypertension Improvement in filling pressures with diuresis.   EKG 02/19/2018: Probable sinus rhythm 76 bpm. Poor R wave progression  Lexiscan sestamibi stress test 02/27/2018: 1. Lexiscan stress test with low level exercise was performed. Exercise capacity was not assessed. No stress symptoms reported. Peak effect blood pressure was 220/86 mmHg. The resting electrocardiogram demonstrated normal sinus rhythm, normal resting conduction, no resting arrhythmias and normal rest repolarization.  Stress EKG is non diagnostic for ischemia as it is a pharmacologic stress. In addition, the stress electrocardiogram showed sinus tachycardia, normal stress conduction, occasional PVC, and normal stress repolarization.   2. The overall quality of the study is good.  Left ventricular cavity is noted to be normal on the rest and stress studies.  Gated SPECT images reveal normal myocardial thickening and wall motion.  The left  ventricular ejection fraction was calculated or visually estimated to be 61%. Medium sized, mild intensity, partially reversible perfusion defect in mid to apical  inferior/inferolateral myocardium. 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  3. Intermediate risk study.  Echocardiogram 02/26/2018: Left ventricle cavity is normal in size. Mild concentric hypertrophy of the left ventricle. Abnormal septal wall motion due to post-operative valve. Diastolic function could not be assessed due to post op valve and CABG status.  Calculated EF 63%. Left atrial cavity is moderate to severely dilated measures 4.5 cm in long axis. Right atrial cavity is mildly dilated. Mechanical aortic valve with trace regurgitation. Mild aortic valve leaflet calcification. Mildly restricted aortic valve leaflets. Mild to moderate aortic valve stenosis. Aortic valve peak pressure gradient of  43 and mean gradient of 21 mmHg, calculated aortic valve area    0.88 cm. Mechanical mitral valve with trace regurgitation. Moderately restricted mitral valve leaflets. Mild mitral valve stenosis. Mitral valve peak pressure gradient of  26  and mean gradient of  6.2  mmHg, calculated mitral valve area 1.9   cm. Mild to moderate tricuspid regurgitation. Mild pulmonary hypertension. PA systolic pressure estimated at 39 mm Hg. Compared to the study done on 08/06/2017, no significant change.  TEE 07/21/2018:  1. The left ventricle has normal systolic function, with an ejection fraction of 55-60%. There is abnormal septal motion consistent with post-operative status.  2. The right ventricle has normal systolc function.  3. A 25 mm Regent mechanical valve is present in the mitral position. Procedure Date: 2018 Echo findings are consistent with is functioning normally the mitral prosthesis. Normal mitral valve prosthesis. Mean PG 6 mmHg, MVA 1.7 cm2. No thrombus seen.  (Patient known to have subtherapeutic  INR).  4. A 19mm [employer] mechanical prosthesis valve is present in the aortic position. Procedure Date: 2018 Normal aortic valve prosthesis. Echo findings shows no evidence of Accelration time of 88 msec suggests normal functioning valve. Mean PG 25 mmHg  likely due to small annular diameter.  No prosthetic valve stenosis noted. of the aortic prosthesis. No thrombus seen. (Patient known to have subtherapeutic INR).  Echocardiogram 08/06/2017: Left ventricle cavity is normal in size. Moderate concentric hypertrophy of the left ventricle. Normal global wall motion. Indeterminate diastolic filling pattern. Calculated EF 63%. Mechanical aortic valve with trace regurgitation. Mild pannus formation on the mechanical AV. Mildly restricted aortic valve leaflets. Mild to moderate aortic valve stenosis. Aortic valve peak pressure gradient of 41 and mean gradient of 22 mmHg, calculated aortic valve area   1.0 cm. Mechanical mitral valve with trace regurgitation. Mild pannus on the mechanical MV. Mildly restricted mitral valve leaflets. Mitral valve peak pressure gradient of 14 and mean gradient of 4 mmHg, calculated mitral valve area 1.8 cm. Mild to moderate tricuspid regurgitation. Mild pulmonary hypertension.   Recent labs: Labs 02/16/2018: BNP 251 elevated  Labs 07/02/2017: A1c 5.7%, HB 12.9/HCT 38.6, platelets 220, normal indicis.  Serum glucose 90 mg, BUN 23, creatinine 0.75, CMP normal.  Potassium 4.6.  TSH normal.  Review of Systems  Constitution: Negative for decreased appetite, malaise/fatigue, weight gain and weight loss.  HENT: Negative for congestion.   Eyes: Negative for visual disturbance.  Cardiovascular: Positive for chest pain and dyspnea on exertion. Negative for leg swelling, palpitations and syncope.  Respiratory: Positive for shortness of breath.   Endocrine: Negative for cold intolerance.  Hematologic/Lymphatic: Does not bruise/bleed easily.  Skin: Negative for itching and rash.   Musculoskeletal: Negative for myalgias.  Gastrointestinal: Negative for abdominal pain, nausea and vomiting.  Genitourinary: Negative for dysuria.  Neurological: Negative for dizziness and weakness.  Psychiatric/Behavioral: The patient is not nervous/anxious.   All other systems reviewed and are negative.        Vitals:   11/27/18 1357  BP: 128/68  Pulse: (!) 56  Temp: 97.7 F (36.5 C)  SpO2: 97%    Objective:     Physical Exam  Constitutional: She is oriented to person, place, and time. She appears well-developed and well-nourished. No distress.  HENT:  Head: Normocephalic and atraumatic.  Eyes: Pupils are equal, round, and reactive to light. Conjunctivae are normal.  Neck: No JVD present.  Cardiovascular: Normal rate, regular rhythm and intact distal pulses.  Murmur heard.  Harsh midsystolic murmur is present with a grade of 2/6 at the upper right sternal border radiating to the neck. Crisp metallic S1 & S2. No diastolic murmur heard.  Pulmonary/Chest: Effort normal and breath sounds normal. She has no wheezes. She has no rales.  Abdominal: Soft. Bowel sounds are normal. There is no rebound.  Musculoskeletal:        General: No edema.  Lymphadenopathy:    She has no cervical adenopathy.  Neurological: She is alert and oriented to person, place, and time. No cranial nerve deficit.  Skin: Skin is warm and dry.  Psychiatric: She has a normal mood and affect.  Nursing note and vitals reviewed.        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   Exertional dyspnea, cough: This is multifactorial-including obesity, moderate pulmonary venous hypertension, as  well as h/o smoking and possible COPD. She is on optimal medical management for CAD, as well as diuresis. I will refer her to pulmonology for  workup and management of possible COPD. After that, she may also need cardiopulmonary rehab to improve her functional capacity.   CAD of native and bypass grafts with angina, S/p MVR, AVR, subtherapeutic INR: Patent grafts with non-critical disease. Continue medical management with Aspirin, Imdur 60 mg daiy, losartan 50 mg daily, metoprolol tartarate 100 mg bid, crestor 40 mg daily.  S/p MVR, AVR: INR today: 3.5 Goal: 2.5-3.5 Indication: Mechanical mitral and aortic valve replacement Current dose: 7.5 mg daily New dose: Same Next INR: 3 weeks  Hypertension: Much better controlled.     Elder Negus, MD [facility] Pager: [phone] Office: [phone] If no answer Cell [phone]

## 2018-11-29 ENCOUNTER — Encounter: Payer: Self-pay | Admitting: Cardiology

## 2018-11-29 DIAGNOSIS — Z951 Presence of aortocoronary bypass graft: Secondary | ICD-10-CM | POA: Insufficient documentation

## 2018-11-29 DIAGNOSIS — R0609 Other forms of dyspnea: Secondary | ICD-10-CM | POA: Insufficient documentation

## 2018-11-29 DIAGNOSIS — Z952 Presence of prosthetic heart valve: Secondary | ICD-10-CM | POA: Insufficient documentation

## 2018-12-06 ENCOUNTER — Other Ambulatory Visit: Payer: Self-pay | Admitting: Cardiology

## 2018-12-18 ENCOUNTER — Ambulatory Visit (INDEPENDENT_AMBULATORY_CARE_PROVIDER_SITE_OTHER): Payer: 59 | Admitting: Cardiology

## 2018-12-18 ENCOUNTER — Other Ambulatory Visit: Payer: Self-pay

## 2018-12-18 DIAGNOSIS — Z952 Presence of prosthetic heart valve: Secondary | ICD-10-CM | POA: Diagnosis not present

## 2018-12-18 DIAGNOSIS — Z7901 Long term (current) use of anticoagulants: Secondary | ICD-10-CM

## 2018-12-18 DIAGNOSIS — Z5181 Encounter for therapeutic drug level monitoring: Secondary | ICD-10-CM | POA: Diagnosis not present

## 2018-12-18 LAB — POCT INR: INR: 2.8 (ref 2.0–3.0)

## 2018-12-18 NOTE — Progress Notes (Signed)
INR today: 2.8 Goal: 2.5-3.5 Indication: Mechanical mitral and aortic valve replacement Current dose: 10 mg M, Th, 7.5 mg all other days New dose: Same Next INR: 4 weeks

## 2018-12-25 ENCOUNTER — Other Ambulatory Visit: Payer: Self-pay | Admitting: Cardiology

## 2019-01-19 ENCOUNTER — Other Ambulatory Visit: Payer: Self-pay

## 2019-01-19 ENCOUNTER — Ambulatory Visit (INDEPENDENT_AMBULATORY_CARE_PROVIDER_SITE_OTHER): Payer: 59 | Admitting: Cardiology

## 2019-01-19 DIAGNOSIS — Z7901 Long term (current) use of anticoagulants: Secondary | ICD-10-CM | POA: Diagnosis not present

## 2019-01-19 DIAGNOSIS — Z952 Presence of prosthetic heart valve: Secondary | ICD-10-CM | POA: Diagnosis not present

## 2019-01-19 DIAGNOSIS — Z5181 Encounter for therapeutic drug level monitoring: Secondary | ICD-10-CM

## 2019-01-19 LAB — POCT INR: INR: 4 — AB (ref 2.0–3.0)

## 2019-01-19 NOTE — Progress Notes (Signed)
INR today: 24.0 Goal: 2.5-3.5 Indication: Mechanical mitral and aortic valve replacement Current dose: 10 mg M, Th, 7.5 mgall other days New dose:Hold today. 10 mg M, 7.5 mg all other days, starting 01/20/2019 Next INR:2 weeks  Vernell Leep, MD

## 2019-02-02 ENCOUNTER — Ambulatory Visit: Payer: 59

## 2019-02-04 DIAGNOSIS — I4891 Unspecified atrial fibrillation: Secondary | ICD-10-CM | POA: Insufficient documentation

## 2019-02-04 MED ORDER — GENERIC EXTERNAL MEDICATION
100.00 | Status: DC
Start: 2019-02-05 — End: 2019-02-04

## 2019-02-04 MED ORDER — AMIODARONE HCL IN DEXTROSE 360-4.14 MG/200ML-% IV SOLN
1.00 | INTRAVENOUS | Status: DC
Start: ? — End: 2019-02-04

## 2019-02-06 ENCOUNTER — Other Ambulatory Visit: Payer: Self-pay | Admitting: Cardiology

## 2019-02-08 MED ORDER — LOPERAMIDE HCL 2 MG PO CAPS
2.00 | ORAL_CAPSULE | ORAL | Status: DC
Start: ? — End: 2019-02-08

## 2019-02-08 MED ORDER — SPIRONOLACTONE 25 MG PO TABS
50.00 | ORAL_TABLET | ORAL | Status: DC
Start: 2019-02-09 — End: 2019-02-08

## 2019-02-08 MED ORDER — FUROSEMIDE 20 MG PO TABS
20.00 | ORAL_TABLET | ORAL | Status: DC
Start: 2019-02-09 — End: 2019-02-08

## 2019-02-08 MED ORDER — LOSARTAN POTASSIUM 25 MG PO TABS
50.00 | ORAL_TABLET | ORAL | Status: DC
Start: 2019-02-09 — End: 2019-02-08

## 2019-02-08 MED ORDER — METOPROLOL SUCCINATE ER 100 MG PO TB24
100.00 | ORAL_TABLET | ORAL | Status: DC
Start: 2019-02-08 — End: 2019-02-08

## 2019-02-08 MED ORDER — CITALOPRAM HYDROBROMIDE 20 MG PO TABS
20.00 | ORAL_TABLET | ORAL | Status: DC
Start: 2019-02-08 — End: 2019-02-08

## 2019-02-08 MED ORDER — ASPIRIN 81 MG PO CHEW
81.00 | CHEWABLE_TABLET | ORAL | Status: DC
Start: 2019-02-09 — End: 2019-02-08

## 2019-02-08 MED ORDER — ACETAMINOPHEN 325 MG PO TABS
650.00 | ORAL_TABLET | ORAL | Status: DC
Start: ? — End: 2019-02-08

## 2019-02-08 MED ORDER — FLUTICASONE FUROATE 100 MCG/ACT IN AEPB
1.00 | INHALATION_SPRAY | RESPIRATORY_TRACT | Status: DC
Start: 2019-02-09 — End: 2019-02-08

## 2019-02-08 MED ORDER — POLYETHYLENE GLYCOL 3350 17 GM/SCOOP PO POWD
17.00 | ORAL | Status: DC
Start: ? — End: 2019-02-08

## 2019-02-08 MED ORDER — ATORVASTATIN CALCIUM 80 MG PO TABS
80.00 | ORAL_TABLET | ORAL | Status: DC
Start: 2019-02-08 — End: 2019-02-08

## 2019-02-08 MED ORDER — ONDANSETRON 4 MG PO TBDP
4.00 | ORAL_TABLET | ORAL | Status: DC
Start: ? — End: 2019-02-08

## 2019-02-08 MED ORDER — DILTIAZEM HCL ER BEADS 240 MG PO CP24
240.00 | ORAL_CAPSULE | ORAL | Status: DC
Start: 2019-02-09 — End: 2019-02-08

## 2019-02-08 MED ORDER — PANTOPRAZOLE SODIUM 40 MG PO TBEC
40.00 | DELAYED_RELEASE_TABLET | ORAL | Status: DC
Start: 2019-02-09 — End: 2019-02-08

## 2019-02-08 MED ORDER — LEVALBUTEROL HCL 1.25 MG/3ML IN NEBU
1.25 | INHALATION_SOLUTION | RESPIRATORY_TRACT | Status: DC
Start: ? — End: 2019-02-08

## 2019-02-08 MED ORDER — SENNOSIDES 8.6 MG PO TABS
2.00 | ORAL_TABLET | ORAL | Status: DC
Start: ? — End: 2019-02-08

## 2019-02-08 MED ORDER — CLONAZEPAM 1 MG PO TABS
0.50 | ORAL_TABLET | ORAL | Status: DC
Start: 2019-02-08 — End: 2019-02-08

## 2019-02-09 MED ORDER — SENNOSIDES 8.6 MG PO TABS
2.00 | ORAL_TABLET | ORAL | Status: DC
Start: ? — End: 2019-02-09

## 2019-02-15 ENCOUNTER — Other Ambulatory Visit: Payer: Self-pay

## 2019-02-15 ENCOUNTER — Ambulatory Visit: Payer: 59

## 2019-02-15 ENCOUNTER — Ambulatory Visit (INDEPENDENT_AMBULATORY_CARE_PROVIDER_SITE_OTHER): Payer: 59 | Admitting: Cardiology

## 2019-02-15 DIAGNOSIS — Z952 Presence of prosthetic heart valve: Secondary | ICD-10-CM

## 2019-02-15 DIAGNOSIS — Z7901 Long term (current) use of anticoagulants: Secondary | ICD-10-CM

## 2019-02-15 DIAGNOSIS — Z5181 Encounter for therapeutic drug level monitoring: Secondary | ICD-10-CM | POA: Diagnosis not present

## 2019-02-15 LAB — POCT INR: INR: 5.1 — AB (ref 2.0–3.0)

## 2019-02-15 NOTE — Progress Notes (Signed)
Goal: 2.5-3.5 Indication: Mechanical mitral and aortic valve replacement Today's INR: 5.1 (Recent antibiotic use. Had minor epistaxis last night) Current dose: 10 mg M, Th, 7.5 mg all other days.   New dose: Hold 2 days. Consider 7.5 mg all days upon return  Next INR check: 12/30  Vernell Leep, MD

## 2019-02-17 ENCOUNTER — Other Ambulatory Visit: Payer: Self-pay | Admitting: Cardiology

## 2019-02-17 ENCOUNTER — Ambulatory Visit: Payer: 59

## 2019-02-17 DIAGNOSIS — Z952 Presence of prosthetic heart valve: Secondary | ICD-10-CM

## 2019-02-17 LAB — PROTIME-INR
INR: 2 — ABNORMAL HIGH (ref 0.9–1.2)
Prothrombin Time: 20.6 s — ABNORMAL HIGH (ref 9.1–12.0)

## 2019-02-17 NOTE — Progress Notes (Signed)
Goal: 2.5-3.5 Indication: Mechanical mitral and aortic valve replacement  Today's INR: 2.0 (5.1 at last visit)  Current dose: Currently held.  New dose: 7.5 mg daily + lovenox 100 mg bid till next visit.  Next INR check: 02/22/2019

## 2019-02-18 ENCOUNTER — Telehealth: Payer: Self-pay

## 2019-02-18 NOTE — Telephone Encounter (Signed)
Pt aware.//ah

## 2019-02-18 NOTE — Progress Notes (Signed)
Not optimal, but reasonable.   Thanks MJP

## 2019-02-18 NOTE — Progress Notes (Signed)
Pt wants to know if she can just take the 7.5 mg until she comes back in on 02/22/2019 because she doesn't want to take the injections

## 2019-02-18 NOTE — Progress Notes (Signed)
Pt aware

## 2019-02-18 NOTE — Telephone Encounter (Signed)
-----   Message from [facility], MD sent at 02/17/2019  4:44 PM EST ----- Goal: 2.5-3.5 Indication: Mechanical mitral and aortic valve replacement Today's INR: 2.0 (5.1 at last visit) Current dose: Currently held.  New dose: 7.5 mg daily + lovenox 100 mg bid till next visit.  Next INR check: 02/22/2019

## 2019-02-22 ENCOUNTER — Encounter: Payer: Self-pay | Admitting: Internal Medicine

## 2019-02-22 ENCOUNTER — Other Ambulatory Visit: Payer: Self-pay

## 2019-02-22 ENCOUNTER — Encounter: Payer: Self-pay | Admitting: Cardiology

## 2019-02-22 ENCOUNTER — Ambulatory Visit (INDEPENDENT_AMBULATORY_CARE_PROVIDER_SITE_OTHER): Payer: 59 | Admitting: Internal Medicine

## 2019-02-22 ENCOUNTER — Ambulatory Visit (INDEPENDENT_AMBULATORY_CARE_PROVIDER_SITE_OTHER): Payer: 59 | Admitting: Cardiology

## 2019-02-22 VITALS — BP 114/60 | HR 60 | Temp 97.2°F | Ht 65.0 in | Wt 226.8 lb

## 2019-02-22 VITALS — BP 127/63 | HR 61 | Ht 65.0 in | Wt 227.0 lb

## 2019-02-22 DIAGNOSIS — I340 Nonrheumatic mitral (valve) insufficiency: Secondary | ICD-10-CM | POA: Diagnosis not present

## 2019-02-22 DIAGNOSIS — I10 Essential (primary) hypertension: Secondary | ICD-10-CM

## 2019-02-22 DIAGNOSIS — Z87891 Personal history of nicotine dependence: Secondary | ICD-10-CM | POA: Diagnosis not present

## 2019-02-22 DIAGNOSIS — R0609 Other forms of dyspnea: Secondary | ICD-10-CM

## 2019-02-22 DIAGNOSIS — Z952 Presence of prosthetic heart valve: Secondary | ICD-10-CM | POA: Diagnosis not present

## 2019-02-22 DIAGNOSIS — Z7901 Long term (current) use of anticoagulants: Secondary | ICD-10-CM | POA: Diagnosis not present

## 2019-02-22 DIAGNOSIS — I25708 Atherosclerosis of coronary artery bypass graft(s), unspecified, with other forms of angina pectoris: Secondary | ICD-10-CM

## 2019-02-22 DIAGNOSIS — Z951 Presence of aortocoronary bypass graft: Secondary | ICD-10-CM | POA: Diagnosis not present

## 2019-02-22 DIAGNOSIS — R0600 Dyspnea, unspecified: Secondary | ICD-10-CM

## 2019-02-22 DIAGNOSIS — I480 Paroxysmal atrial fibrillation: Secondary | ICD-10-CM

## 2019-02-22 LAB — POCT INR: INR: 1.9 — AB (ref 2.0–3.0)

## 2019-02-22 MED ORDER — STIOLTO RESPIMAT 2.5-2.5 MCG/ACT IN AERS: 2.0000 | INHALATION_SPRAY | Freq: Every day | RESPIRATORY_TRACT | 11 refills | Status: DC

## 2019-02-22 MED ORDER — LEVALBUTEROL TARTRATE 45 MCG/ACT IN AERO: 1.0000 | INHALATION_SPRAY | Freq: Four times a day (QID) | RESPIRATORY_TRACT | 12 refills | Status: DC | PRN

## 2019-02-22 MED ORDER — METOPROLOL TARTRATE 100 MG PO TABS: 100.0000 mg | ORAL_TABLET | Freq: Two times a day (BID) | ORAL | 3 refills | Status: DC

## 2019-02-22 NOTE — Progress Notes (Signed)
Subjective:   Kimberly Hobbs, female    DOB: Feb 16, 1961, 59 y.o.   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  Patient was recently admitted to a [facility] hospital in [place], [place] with Coffee. She had paroxysmal afib with aberrent conduction, although VT could not be excluded. Patient is doing remarkably well and does not have any residual symptoms since COVID. INR has been variable since her hospital admission.    Current Outpatient Medications on File Prior to Visit  Medication Sig Dispense Refill  . acetaminophen (TYLENOL) 500 MG tablet Take 500 mg by mouth every 6 (six) hours as needed (for pain.).    Marland Kitchen albuterol (VENTOLIN HFA) 108 (90 Base) MCG/ACT inhaler Inhale 2 puffs into the lungs every 6 (six) hours as needed for wheezing or shortness of breath.     Marland Kitchen aspirin EC 81 MG tablet Take 81 mg by mouth daily.     . citalopram (CELEXA) 40 MG tablet Take 20 mg by mouth 2 (two) times a day.    . clonazePAM (KLONOPIN) 0.5 MG tablet Take 0.5 mg by mouth at bedtime.     Marland Kitchen diltiazem (CARDIZEM CD) 180 MG 24 hr capsule Take 180 mg by mouth daily.    . fluticasone (FLOVENT DISKUS) 50 MCG/BLIST diskus inhaler Inhale 1 puff into the lungs as needed.     . furosemide (LASIX) 20 MG tablet TAKE 1 TABLET BY MOUTH DAILY 90 tablet 1  . isosorbide mononitrate (IMDUR) 60 MG 24 hr tablet Take 1 tablet (60 mg total) by mouth daily. 90 tablet 3  . losartan (COZAAR) 50 MG tablet TAKE 1 TABLET BY MOUTH DAILY 30 tablet 6  . metFORMIN (GLUCOPHAGE) 500 MG tablet Take 500 mg by mouth 2 (two) times daily.    . metoprolol succinate (TOPROL-XL) 100 MG 24 hr tablet Take 100 mg by mouth daily. Take with or  immediately following a meal.    . nitroGLYCERIN (NITROSTAT) 0.4 MG SL tablet Place 0.4 mg under the tongue every 5 (five) minutes x 3 doses as needed for chest pain.     . pantoprazole (PROTONIX) 40 MG tablet Take 1 tablet (40 mg total) by mouth daily. 90 tablet 1  . rosuvastatin (CRESTOR) 40 MG tablet TAKE 1 TABLET BY MOUTH DAILY 90 tablet 1  . spironolactone (ALDACTONE) 50 MG tablet Take 1 tablet (50 mg total) by mouth daily. 30 tablet 3  . warfarin (COUMADIN) 7.5 MG tablet TAKE 1 TABLET BY MOUTH EVERY EVENING 60 tablet 1  . busPIRone (BUSPAR) 5 MG tablet daily.     No current facility-administered medications on file prior to visit.    Cardiovascular studies:  EKG 02/22/2019: Probable sinus rhythm 63 bpm. 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 Poor R wave progression.  Coronary and bypass graft angiography 07/21/2018 and 08/18/2018: LM: Normal LAD: 100% mid occlusion. Mild distal disease LIMA-LAD: Patent LCx: Normal RCA: Prox 60% stenosis, mid 30-40% stenoses. TIMI III flow in prox-mid RCA. 100% distal occlusion SVG-RCA: Patent. Ostial 40%. Distal RCA moderate diffuse disease.   Guide catheter angiography provided superior images. There is TIMI III flow in SVG which fills RCA all the way to the ostium. Even if she were to have FFR  positive lesion in the graft, I do not think the benefits of a stent would outweigh the risk of losing the graft and the entire RCA territory circulation in a borderline lesion. Also, the prox RCA lesion is well bypassed by the SVG graft. Thus, I decided not to perform FFR/PCI to either of these lesions. Continue medical management.   [place] 07/21/2018: #1: RA: 18 mmHg RV 90/12 mmHg PA: 94/40 mmHg. Mean PA 63 mmHg. PW 33 mmHg  Lasix 80 mg  #2: RA 19 mmHg RV 60/0 mmHg PA: 56/16 mmHg. Mean PA 36 mmHg PW: 25 mmHg  CO: 5.4 L/min. CI 2.6 L/min/m2 PVR 2 WU  Impression: Elevated filling pressures Moderate WHO Grp II pulmonary hypertension Improvement in  filling pressures with diuresis.   EKG 02/19/2018: Probable sinus rhythm 76 bpm. Poor R wave progression  Lexiscan sestamibi stress test 02/27/2018: 1. Lexiscan stress test with low level exercise was performed. Exercise capacity was not assessed. No stress symptoms reported. Peak effect blood pressure was 220/86 mmHg. The resting electrocardiogram demonstrated normal sinus rhythm, normal resting conduction, no resting arrhythmias and normal rest repolarization.  Stress EKG is non diagnostic for ischemia as it is a pharmacologic stress. In addition, the stress electrocardiogram showed sinus tachycardia, normal stress conduction, occasional PVC, and normal stress repolarization.   2. The overall quality of the study is good.  Left ventricular cavity is noted to be normal on the rest and stress studies.  Gated SPECT images reveal normal myocardial thickening and wall motion.  The left ventricular ejection fraction was calculated or visually estimated to be 61%. Medium sized, mild intensity, partially reversible perfusion defect in mid to apical  inferior/inferolateral myocardium. 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  3. Intermediate risk study.  Echocardiogram 02/26/2018: Left ventricle cavity is normal in size. Mild concentric hypertrophy of the left ventricle. Abnormal septal wall motion due to post-operative valve. Diastolic function could not be assessed due to post op valve and CABG status.  Calculated EF 63%. Left atrial cavity is moderate to severely dilated measures 4.5 cm in long axis. Right atrial cavity is mildly dilated. Mechanical aortic valve with trace regurgitation. Mild aortic valve leaflet calcification. Mildly restricted aortic valve leaflets. Mild to moderate aortic valve stenosis. Aortic valve peak pressure gradient of  43 and mean gradient of 21 mmHg, calculated aortic valve area    0.88 cm. Mechanical mitral valve with trace  regurgitation. Moderately restricted mitral valve leaflets. Mild mitral valve stenosis. Mitral valve peak pressure gradient of  26  and mean gradient of  6.2  mmHg, calculated mitral valve area 1.9   cm. Mild to moderate tricuspid regurgitation. Mild pulmonary hypertension. PA systolic pressure estimated at 39 mm Hg. Compared to the study done on 08/06/2017, no significant change.  TEE 07/21/2018:  1. The left ventricle has normal systolic function, with an ejection fraction of 55-60%. There is abnormal septal motion consistent with post-operative status.  2. The right ventricle has normal systolc function.  3. A 25 mm Regent mechanical valve is present in the mitral position. Procedure Date: 2018 Echo findings are consistent with is functioning normally the mitral prosthesis. Normal mitral valve prosthesis. Mean PG 6 mmHg, MVA 1.7 cm2. No thrombus seen.  (Patient known to have subtherapeutic INR).  4. A 5mm [employer] Jude mechanical prosthesis valve is present in the aortic position. Procedure Date: 2018 Normal aortic valve prosthesis. Echo findings shows no evidence of Accelration time of 88 msec suggests  normal functioning valve. Mean PG 25 mmHg  likely due to small annular diameter. No prosthetic valve stenosis noted. of the aortic prosthesis. No thrombus seen. (Patient known to have subtherapeutic INR).  Echocardiogram 08/06/2017: Left ventricle cavity is normal in size. Moderate concentric hypertrophy of the left ventricle. Normal global wall motion. Indeterminate diastolic filling pattern. Calculated EF 63%. Mechanical aortic valve with trace regurgitation. Mild pannus formation on the mechanical AV. Mildly restricted aortic valve leaflets. Mild to moderate aortic valve stenosis. Aortic valve peak pressure gradient of 41 and mean gradient of 22 mmHg, calculated aortic valve area   1.0 cm. Mechanical mitral valve with trace regurgitation. Mild pannus on the mechanical MV. Mildly restricted mitral  valve leaflets. Mitral valve peak pressure gradient of 14 and mean gradient of 4 mmHg, calculated mitral valve area 1.8 cm. Mild to moderate tricuspid regurgitation. Mild pulmonary hypertension.   Recent labs: Labs 02/16/2018: BNP 251 elevated  Labs 07/02/2017: A1c 5.7%, HB 12.9/HCT 38.6, platelets 220, normal indicis.  Serum glucose 90 mg, BUN 23, creatinine 0.75, CMP normal.  Potassium 4.6.  TSH normal.  Review of Systems  Constitution: Negative for decreased appetite, malaise/fatigue, weight gain and weight loss.  HENT: Negative for congestion.   Eyes: Negative for visual disturbance.  Cardiovascular: Positive for chest pain and dyspnea on exertion. Negative for leg swelling, palpitations and syncope.  Respiratory: Positive for shortness of breath.   Endocrine: Negative for cold intolerance.  Hematologic/Lymphatic: Does not bruise/bleed easily.  Skin: Negative for itching and rash.  Musculoskeletal: Negative for myalgias.  Gastrointestinal: Negative for abdominal pain, nausea and vomiting.  Genitourinary: Negative for dysuria.  Neurological: Negative for dizziness and weakness.  Psychiatric/Behavioral: The patient is not nervous/anxious.   All other systems reviewed and are negative.        Vitals:   02/22/19 1150  BP: 127/63  Pulse: 61  SpO2: 97%    Objective:     Physical Exam  Constitutional: She is oriented to person, place, and time. She appears well-developed and well-nourished. No distress.  HENT:  Head: Normocephalic and atraumatic.  Eyes: Pupils are equal, round, and reactive to light. Conjunctivae are normal.  Neck: No JVD present.  Cardiovascular: Normal rate, regular rhythm and intact distal pulses.  Murmur heard.  Harsh midsystolic murmur is present with a grade of 2/6 at the upper right sternal border radiating to the neck. Crisp metallic S1 & S2. No diastolic murmur heard.  Pulmonary/Chest: Effort normal and breath sounds normal. She has no wheezes.  She has no rales.  Abdominal: Soft. Bowel sounds are normal. There is no rebound.  Musculoskeletal:        General: No edema.  Lymphadenopathy:    She has no cervical adenopathy.  Neurological: She is alert and oriented to person, place, and time. No cranial nerve deficit.  Skin: Skin is warm and dry.  Psychiatric: She has a normal mood and affect.  Nursing note and vitals reviewed.        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  Exertional dyspnea, cough: This is multifactorial-including obesity, moderate pulmonary venous hypertension, as well as h/o smoking and possible COPD. She is on optimal medical management for CAD, as well as diuresis. Symptoms have now improved, in spite of  recent COVID.  CAD of native and bypass grafts with angina, S/p MVR, AVR, subtherapeutic INR: Patent grafts with non-critical disease. Continue medical management with Aspirin, Imdur 60 mg daiy, losartan 50 mg daily, metoprolol tartarate 100 mg bid, crestor 40 mg daily.  Paroxysmal Afib: Now also on diltiazem CD 180 mg daily. Maintaining sinus rhythm.   S/p MVR, AVR: Discrepancy in INR checks in office today (7 & 1.9). Will check in [facility].  Hypertension: Controlled.  Repeat INR check in 1 week. F/u w/me in 6 months.  Nigel Mormon, MD [facility] [place] Pager: [phone] Office: [phone] If no answer Cell [phone]

## 2019-02-22 NOTE — Progress Notes (Signed)
Kimberly Hobbs    [id number]    1960-07-06  Primary Care Physician:Redmon, Enid Cutter  Referring Physician: Lennie Odor, PA-C [address] [place],  [place] [address] Reason for Consultation: sob Date of Consultation: 02/22/2019  Chief complaint:   Chief Complaint  Patient presents with  . Pulmonary Consult    Referred by Dr. Marisue Humble for sob. Patient reports sob with exertion and a productive cough with clear sputum.      HPI:  Presents with shortness of breath.  Significant cardiac history including CAD, Rheumatic and aortic valve replacements as well as CABGx2 in 2018 at [facility], with h/o post op Afib. Shortness of breath persistent since surgery and has chest wall pain.  She did cardiac rehab for about a month after surgery but didn't finish course due to recovering away from her home with her daughter. Quit smoking the day of her surgery - 40 pack year use of cigarettes.   Occasional cough. Wheezing when she forces air out. Had recurrent bronchitis prior to surgery, none recently.    Has had significant weight gain since surgery as well.   December 12th had covid pneumonia tested at outside facility - thinks she got it from her granddaughter while visiting - had fevers. She was treated with prednisone for 6 days.   Was given flovent diskus by the ED on 12/17. Also had recent AF with RVR and is now on metoprolol (cardizem also noted in chart, patient unsure if she is taking.) She has taken albuterol prn in the past which helps a lot.   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  Exposures: her husband still smokes at home, has a Camera operator Smoking history: 40 pack year history  Social History   Occupational History  . Not on file  Tobacco Use  . Smoking status: Former Smoker    Packs/day: 1.50    Years: 40.00    Pack years: 60.00    Types: Cigarettes    Quit date: 12/23/2016    Years since  quitting: 2.1  . Smokeless tobacco: Never Used  Substance and Sexual Activity  . Alcohol use: Not Currently  . Drug use: No  . Sexual activity: Not on file    Relevant family history: Family History  Problem Relation Age of Onset  . Cancer Mother   . Hypertension Mother   . Diabetes Mother   . Hyperlipidemia Mother   . Heart disease Mother   . Cancer Father   . Hypertension Father   . Diabetes Father   . Heart disease Father   . Hypertension Sister   . Hypertension Brother   . Hypertension Brother     Past Medical History:  Diagnosis Date  . COVID-19   . Hyperlipidemia   . Hypertension   . Rheumatic heart disease    MS/ AS    Past Surgical History:  Procedure Laterality Date  . CARDIAC CATHETERIZATION    . CHOLECYSTECTOMY    . CORONARY/GRAFT ANGIOGRAPHY N/A 08/18/2018   Procedure: CORONARY/GRAFT ANGIOGRAPHY;  Surgeon: Nigel Mormon, MD;  Location: [facility];  Service: Cardiovascular;  Laterality: N/A;  . LEG SURGERY     "metal plate in leg"  . RIGHT HEART CATH AND CORONARY/GRAFT ANGIOGRAPHY N/A 07/21/2018   Procedure: RIGHT HEART CATH AND CORONARY/GRAFT ANGIOGRAPHY;  Surgeon: Nigel Mormon, MD;  Location: [facility];  Service: Cardiovascular;  Laterality:  N/A;  . TEE WITHOUT CARDIOVERSION N/A 07/27/2015   Procedure: TRANSESOPHAGEAL ECHOCARDIOGRAM (TEE);  Surgeon: Sanda Klein, MD;  Location: [facility];  Service: Cardiovascular;  Laterality: N/A;  . TEE WITHOUT CARDIOVERSION N/A 07/21/2018   Procedure: TRANSESOPHAGEAL ECHOCARDIOGRAM (TEE);  Surgeon: Nigel Mormon, MD;  Location: [facility];  Service: Cardiovascular;  Laterality: N/A;  . TONSILLECTOMY AND ADENOIDECTOMY       Review of systems: Review of Systems  Constitutional: Negative for chills, fever and weight loss.  HENT: Negative for congestion, sinus pain and sore throat.   Eyes: Negative for discharge and redness.  Respiratory: Positive for cough, shortness of  breath and wheezing. Negative for hemoptysis and sputum production.   Cardiovascular: Positive for chest pain. Negative for palpitations and leg swelling.  Gastrointestinal: Negative for heartburn, nausea and vomiting.  Musculoskeletal: Negative for joint pain and myalgias.  Skin: Negative for rash.  Neurological: Negative for dizziness, tremors, focal weakness and headaches.  Endo/Heme/Allergies: Negative for environmental allergies.  Psychiatric/Behavioral: Negative for depression. The patient is not nervous/anxious.   All other systems reviewed and are negative.   Physical Exam: Blood pressure 114/60, pulse 60, temperature (!) 97.2 F (36.2 C), temperature source Temporal, height 5\' 5"  (1.651 m), weight 226 lb 12.8 oz (102.9 kg), SpO2 98 %. Gen:      No acute distress, overweight.  Eyes: EOMI, sclera anicteric ENT:  no nasal polyps, mucus membranes moist Neck:     Supple, no thyromegaly Lungs:    No increased respiratory effort, symmetric chest wall excursion, clear to auscultation bilaterally, no wheezes or crackles. Well healed median sternotomy scar CV:         Regular rate and rhythm; no murmurs, rubs, or gallops.  No pedal edema. Mechanical valves are auscultated.  Abd:      + bowel sounds; soft, non-tender; no distension MSK: no acute synovitis of DIP or PIP joints, no mechanics hands.  Skin:      Warm and dry; no rashes Neuro: normal speech, no focal facial asymmetry Psych: alert and oriented x3, normal mood and affect  Data Reviewed: Imaging: I have personally reviewed the chest xray Dec 2019 with cardiomegaly and mild pulmonary edema  PFTs: None available  Labs:  Immunization status: Immunization History  Administered Date(s) Administered  . Influenza-Unspecified 01/14/2019   Assessment:  Dyspnea on exertion History of Tobacco Use Disorder CAD s/p CABG and AVR/MVR  Obesity Body mass index is 37.74 kg/m.  Atrial Fibrillation with recent RVR  Suspect  multifactorial dyspnea from COPD, weight gain, deconditioning following CABG.   Plan/Recommendations: Will start [place] with Stiolto. Will give her Prn xopanex instead of albuterol due to Atrial Fibrillation history. Full set of PFTs. Eventually once it resumes would like to get her back into cardiac or pulmonary rehab.   I spent 48 minutes on 02/22/2019 in care of this patient including face to face time and non-face to face time spent charting, review of outside records, and coordination of care.  Return to Care: Return in about 3 months (around 05/23/2019).  Lenice Llamas, MD Pulmonary and [place]  CC: [place], [place], [place]

## 2019-02-22 NOTE — Patient Instructions (Signed)
The patient should have follow up scheduled with myself in 3 months.   Prior to next visit patient should have a full set of PFTs including:  Spirometry with bronchodilator if obstructed Lung Volumes DLCO  We are starting a new inahler called Stiolto which you should take 2 puffs everyday no matter what.  Start taking Xopanex every 6 hours as needed for shortness of breath.   ------------------------------------------------------------------------------------------------------------------- Metered Dose Inhaler (MDI) Instructions (Xopanex)  Before using your inhaler for the first time: 1. Take the cap off the mouthpiece. 2. Shake the inhaler for 5 seconds 3. Press down on the canister to spray the medicine into the air. 4. Repeat these steps 3 more times. If you haven't used your inhaler in more than 2 weeks, repeat these steps before using it.  To use your inhaler: 1. Take the cap off the mouthpiece 2. Shake the inhaler for 5 seconds. 3. Hold it upright with your finger on the top of the canister and your thumb on the bottom of the inhaler. 4. Breathe out. 5. Close your lips around the mouthpiece. 6. As you start to inhale the next breath, press down on the canister. 7. Inhale deeply and slowly through your mouth. 8. Hold your breath for 5 to 10 seconds to keep the medicine in your lungs. 9. Let your breath out.   10. Repeat these steps if you are supposed to take 2 puffs.  11. Put the cap back on the mouthpiece 12. Remember to rinse, gargle and spit with water after use if your inhaler has a steroid in it (Advair, Symbicort, Dulera, Qvar, Flovent)  Checking your MDI It is important that you know how much medication is left in your inhaler. The number of puffs contained in your MDI is printed on the side of the canister. After you have used that number of puffs, you must discard your inhaler even if it continues to spray. Keep track of how many puffs you have used. You also must  include priming puffs in this total. If you use an MDI every day for control of symptoms, you can determine how long it will last by dividing the total number of puffs in the MDI by the total puffs you use every day. For example: 2 puffs x 2 times per day = 4 total puffs per day. At 120 puffs, the MDI will last 30 days. If you use an inhaler only when you need to, you must keep track of how many times you spray the inhaler. Some of the newer MDIs have counting devices built in.  If your MDI does not have a dose counter, you can obtain a device that attaches to the MDI and counts down the number of puffs each time you press the inhaler. Ask your health care professional for more information about these devices, as well as how to best keep track of your medicine without an add-on device (if you prefer).  Understanding COPD   What is COPD? COPD stands for chronic obstructive pulmonary (lung) disease. COPD is a general term used for several lung diseases.  COPD is an umbrella term and encompasses other  common diseases in this group like chronic bronchitis and emphysema. Chronic asthma may also be included in this group. While some patients with COPD have only chronic bronchitis or emphysema, most patients have a combination of both.  You might hear these terms used in exchange for one another.   COPD adds to the work of the heart.  Diseased lungs may reduce the amount of oxygen that goes to the blood. High blood pressure in blood vessels from the heart to the lungs makes it difficult for the heart to pump. Lung disease can also cause the body to produce too many red blood cells which may make the blood thicker and harder to pump.   Patients who have COPD with low oxygen levels may develop an enlarged heart (cor pulmonale). This condition weakens the heart and causes increased shortness of breath and swelling in the legs and feet.   Chronic bronchitis Chronic bronchitis is irritation and inflammation  (swelling) of the lining in the bronchial tubes (air passages). The irritation causes coughing and an excess amount of mucus in the airways. The swelling makes it difficult to get air in and out of the lungs. The small, hair-like structures on the inside of the airways (called cilia) may be damaged by the irritation. The cilia are then unable to help clean mucus from the airways.  Bronchitis is generally considered to be chronic when you have: a productive cough (cough up mucus) and shortness of breath that lasts about 3 months or more each year for 2 or more years in a row. Your doctor may define chronic bronchitis differently.   Emphysema Emphysema is the destruction, or breakdown, of the walls of the alveoli (air sacs) located at the end of the bronchial tubes. The damaged alveoli are not able to exchange oxygen and carbon dioxide between the lungs and the blood. The bronchioles lose their elasticity and collapse when you exhale, trapping air in the lungs. The trapped air keeps fresh air and oxygen from entering the lungs.   Who is affected by COPD? Emphysema and chronic bronchitis affect approximately 16 million people in the [place], or close to 11 percent of the population.   Symptoms of COPD   Shortness of breath   Shortness of breath with mild exercise (walking, using the stairs, etc.)   Chronic, productive cough (with mucus)   A feeling of "tightness" in the chest   Wheezing   What causes COPD? The two primary causes of COPD are cigarette smoking and alpha1-antitrypsin (AAT) deficiency. Air pollution and occupational dusts may also contribute to COPD, especially when the person exposed to these substances is a cigarette smoker.  Cigarette smoke causes COPD by irritating the airways and creating inflammation that narrows the airways, making it more difficult to breathe. Cigarette smoke also causes the cilia to stop working properly so mucus and trapped particles are not cleaned  from the airways. As a result, chronic cough and excess mucus production develop, leading to chronic bronchitis.  In some people, chronic bronchitis and infections can lead to destruction of the small airways, or emphysema.  AAT deficiency, an inherited disorder, can also lead to emphysema. Alpha antitrypsin (AAT) is a protective material produced in the liver and transported to the lungs to help combat inflammation. When there is not enough of the chemical AAT, the body is no longer protected from an enzyme in the white blood cells.   How is COPD diagnosed?  To diagnose COPD, the physician needs to know: . Do you smoke?  . Have you had chronic exposure to dust or air pollutants?  . Do other members of your family have lung disease?  Marland Kitchen Are you short of breath?  . Do you get short of breath with exercise?  Marland Kitchen Do you have chronic cough and/or wheezing?  Marland Kitchen Do you cough up excess  mucus?  To help with the diagnosis, the physician will conduct a thorough physical exam which includes:  1. Listening to your lungs and heart  2. Checking your blood pressure and pulse  3. Examining your nose and throat  4. Checking your feet and ankles for swelling   Laboratory and other tests Several laboratory and other tests are needed to confirm a diagnosis of COPD. These tests may include:  . Chest X-ray to look for lung changes that could be caused by COPD  .  Spirometry and pulmonary function tests (PFTs) to determine lung volume and air flow  . Pulse oximetry to measure the saturation of oxygen in the blood  . Arterial blood gases (ABGs) to determine the amount of oxygen and carbon dioxide in the blood  . Exercise testing to determine if the oxygen level in the blood drops during exercise   Treatment In the beginning stages of COPD, there is minimal shortness of breath that may be noticed only during exercise. As the disease progresses, shortness of breath may worsen and you may need to wear an oxygen device.    To help control other symptoms of COPD, the following treatments and lifestyle changes may be prescribed.  . Quitting smoking  . Avoiding cigarette smoke and other irritants  . Taking medications including: a. bronchodilators b. anti-inflammatory agents c. oxygen d. antibiotics  . Maintaining a healthy diet  . Following a structured exercise program such as pulmonary rehabilitation . Preventing respiratory infections  . Controlling stress   If your COPD progresses, you may be eligible to be evaluated for lung volume reduction surgery or lung transplantation. You may also be eligible to participate in certain clinical trials (research studies). Ask your health care providers about studies being conducted in your hospital.   What is the outlook? Although COPD can not be cured, its symptoms can be treated and your quality of life can be improved. Your prognosis or outlook for the future will depend on how well your lungs are functioning, your symptoms, and how well you respond to and follow your treatment plan.

## 2019-02-23 LAB — PROTIME-INR
INR: 2.6 — ABNORMAL HIGH (ref 0.9–1.2)
Prothrombin Time: 26.5 s — ABNORMAL HIGH (ref 9.1–12.0)

## 2019-02-23 NOTE — Progress Notes (Signed)
INR at goal. Continue 7.5 mg daily. Please change INR f/u from 1/11 to 1/18.  Goal: 2.5-3.5 Indication: Mechanical mitral and aortic valve replacement  Today's INR: 2.6 Current dose: 7.5 mg daily  New dose: Same  Next INR check: 03/08/2019

## 2019-03-04 ENCOUNTER — Other Ambulatory Visit: Payer: Self-pay | Admitting: Cardiology

## 2019-03-08 ENCOUNTER — Other Ambulatory Visit: Payer: Self-pay

## 2019-03-08 ENCOUNTER — Ambulatory Visit (INDEPENDENT_AMBULATORY_CARE_PROVIDER_SITE_OTHER): Payer: 59 | Admitting: Cardiology

## 2019-03-08 DIAGNOSIS — Z7901 Long term (current) use of anticoagulants: Secondary | ICD-10-CM | POA: Diagnosis not present

## 2019-03-08 DIAGNOSIS — Z952 Presence of prosthetic heart valve: Secondary | ICD-10-CM

## 2019-03-08 DIAGNOSIS — Z5181 Encounter for therapeutic drug level monitoring: Secondary | ICD-10-CM | POA: Diagnosis not present

## 2019-03-08 MED ORDER — DILTIAZEM HCL ER COATED BEADS 180 MG PO CP24: 180.0000 mg | ORAL_CAPSULE | Freq: Every day | ORAL | 2 refills | Status: DC

## 2019-03-08 NOTE — Progress Notes (Signed)
Goal: 2.5-3.5 Indication: Mechanical mitral and aortic valve replacement  Today's INR: 3.8 Current dose: 7.5 mg daily  New dose: Same  Next INR check: 03/08/2019

## 2019-03-09 LAB — PROTIME-INR
INR: 3.7 — ABNORMAL HIGH (ref 0.9–1.2)
Prothrombin Time: 37.8 s — ABNORMAL HIGH (ref 9.1–12.0)

## 2019-03-12 ENCOUNTER — Telehealth: Payer: Self-pay

## 2019-03-12 NOTE — Telephone Encounter (Signed)
If she does eat salad, she should limit to 1 cup and eat the same amount everyday, as it can lower her INR.

## 2019-03-12 NOTE — Telephone Encounter (Signed)
Patient wants to know if she can eat a salad everyday. If so how much?

## 2019-03-12 NOTE — Telephone Encounter (Signed)
Spoke with patient gave Dr. Bonney Roussel recommendation. Patient verbalized understanding.

## 2019-03-15 ENCOUNTER — Encounter: Payer: 59 | Attending: Physician Assistant | Admitting: Registered"

## 2019-03-15 ENCOUNTER — Other Ambulatory Visit: Payer: Self-pay

## 2019-03-15 ENCOUNTER — Encounter: Payer: Self-pay | Admitting: Registered"

## 2019-03-15 DIAGNOSIS — E119 Type 2 diabetes mellitus without complications: Secondary | ICD-10-CM | POA: Insufficient documentation

## 2019-03-15 NOTE — Patient Instructions (Signed)
Continue exercising as tolerated. You can exercise if your blood sugar gets high to help bring it down. Also stay hydrated. Continue drinking water during the day. Aim to eat balanced meals and snacks Start with aiming for 30 grams or less carbs with your meals. Remember to include protein with at least 2 meals and all meals with carbs. Consider checking your blood sugar as you are adjusting your diet. Continue taking medications as directed by your doctor.

## 2019-03-15 NOTE — Progress Notes (Signed)
Diabetes Self-Management Education  Visit Type: First/Initial  Appt. Start Time: 1100 Appt. End Time: 1210  03/15/2019  [name], identified by name and date of birth, is a 59 y.o. female with a diagnosis of Diabetes: Type 2.   ASSESSMENT  There were no vitals taken for this visit. There is no height or weight on file to calculate BMI.  Pt states since new diagnosis of T2DM, Pt states she feels like she can't eat anything due to limitation of vitamin K with coumadin therapy and now has been trying to cut carbs out of diet. Pt states she was a 51 yr smoker but quit 2 yrs ago. Pt states she used that confidence to help her stop eating sweets.  SMBG: patient as been randomly checking with readings 104-165 mg/dL Due to patients dietary changes and metformin, has likely brought her BG under control.  Sleep: patient states she gets good sleep. Goes to bed somewhere between 8:30-10 pm and wakes up at 7 am.  Dietary habits: Patient may eat breakfast late and then skip lunch. Patient states she stopped snacking on chips and candy and feels this habit was mostly due to handling depression but is finding other ways to address it now.  Diabetes Self-Management Education - 03/15/19 1058      Visit Information   Visit Type  First/Initial      Initial Visit   Diabetes Type  Type 2    Are you currently following a meal plan?  Yes   low vit K, low sugar   Are you taking your medications as prescribed?  Yes   metformin 750 mg total   Date Diagnosed  January 2021      Health Coping   How would you rate your overall health?  Fair      Psychosocial Assessment   Patient Belief/Attitude about Diabetes  Afraid    How often do you need to have someone help you when you read instructions, pamphlets, or other written materials from your doctor or pharmacy?  1 - Never    What is the last grade level you completed in school?  GED      Complications   Last HgB A1C per patient/outside source  8.8  %    How often do you check your blood sugar?  1-2 times/day    Fasting Blood glucose range (mg/dL)  70-129   108-125   Have you had a dilated eye exam in the past 12 months?  No    Have you had a dental exam in the past 12 months?  No    Are you checking your feet?  No      Dietary Intake   Breakfast  ww bread, egg, cheese OR oatmeal, tsp PB, 8-10 raisins, 1/2 tsp honey    Snack (morning)  none    Lunch  none or apple OR chicken & cheese sandwich    Snack (afternoon)  none    Dinner  1 cup salad OR sweet potato    Snack (evening)  none    Beverage(s)  water with drop-in flavor sweetened with stevia, 1 c coffee in am      Exercise   Exercise Type  Light (walking / raking leaves)    How many days per week to you exercise?  6    How many minutes per day do you exercise?  30    Total minutes per week of exercise  180      Patient  Education   Previous Diabetes Education  No    Disease state   Definition of diabetes, type 1 and 2, and the diagnosis of diabetes    Nutrition management   Role of diet in the treatment of diabetes and the relationship between the three main macronutrients and blood glucose level;Food label reading, portion sizes and measuring food.;Carbohydrate counting    Physical activity and exercise   Role of exercise on diabetes management, blood pressure control and cardiac health.    Monitoring  Identified appropriate SMBG and/or A1C goals.;Purpose and frequency of SMBG.      Outcomes   Expected Outcomes  Demonstrated interest in learning. Expect positive outcomes    Future DMSE  4-6 wks    Program Status  Not Completed       Individualized Plan for Diabetes Self-Management Training:   Learning Objective:  Patient will have a greater understanding of diabetes self-management. Patient education plan is to attend individual and/or group sessions per assessed needs and concerns.   Plan:   Patient Instructions  Continue exercising as tolerated. You can exercise  if your blood sugar gets high to help bring it down. Also stay hydrated. Continue drinking water during the day. Aim to eat balanced meals and snacks Start with aiming for 30 grams or less carbs with your meals. Remember to include protein with at least 2 meals and all meals with carbs. Consider checking your blood sugar as you are adjusting your diet. Continue taking medications as directed by your doctor.   Expected Outcomes:  Demonstrated interest in learning. Expect positive outcomes  Education material provided: Food label handouts, A1C conversion sheet, My Plate, Snack sheet and Carbohydrate counting sheet  If problems or questions, patient to contact team via:  Phone and MyChart  Future DSME appointment: 4-6 wks

## 2019-03-19 ENCOUNTER — Telehealth: Payer: Self-pay

## 2019-03-19 NOTE — Telephone Encounter (Signed)
Patient called and stated that her resting heart rate is 40 and the highest is 70 when exercising only. Is this something that she should be concerned about? Please advise.

## 2019-03-19 NOTE — Telephone Encounter (Signed)
Recommend EKG and event monitor coming week.  Thanks MJP

## 2019-03-22 ENCOUNTER — Other Ambulatory Visit: Payer: Self-pay | Admitting: Cardiology

## 2019-03-23 NOTE — Telephone Encounter (Signed)
Patient said she has an INR appt coming up on 03/29/2019, but your schedule for that day is already full, do you want her to combine both appts for another day or keep that nurse visit appt and we do all 3 (INR, EKG & Event monitor). Please advise.

## 2019-03-23 NOTE — Telephone Encounter (Signed)
Patient

## 2019-03-24 NOTE — Telephone Encounter (Signed)
Patient showed understanding. She will be in on Monday.

## 2019-03-29 ENCOUNTER — Ambulatory Visit (INDEPENDENT_AMBULATORY_CARE_PROVIDER_SITE_OTHER): Payer: 59 | Admitting: Cardiology

## 2019-03-29 ENCOUNTER — Ambulatory Visit: Payer: 59

## 2019-03-29 ENCOUNTER — Other Ambulatory Visit: Payer: Self-pay

## 2019-03-29 DIAGNOSIS — Z952 Presence of prosthetic heart valve: Secondary | ICD-10-CM

## 2019-03-29 DIAGNOSIS — R001 Bradycardia, unspecified: Secondary | ICD-10-CM

## 2019-03-29 DIAGNOSIS — Z7901 Long term (current) use of anticoagulants: Secondary | ICD-10-CM

## 2019-03-29 LAB — POCT INR: INR: 3.3 — AB (ref 2.0–3.0)

## 2019-03-29 NOTE — Progress Notes (Signed)
Goal: 2.5-3.5 Indication: Mechanical mitral and aortic valve replacement  Today's INR: 3.3 Current dose: 7.5 mg daily  New dose: Same  Next INR check: 4 weeks  EKG 03/29/2019: Sinus rhythm 52 bpm. Left axis -anterior fascicular block.   Event monitor placed to evaluate bradycardia/decreased heart rate response on exercise.  Nigel Mormon, MD [facility] Pager: [phone] Office: [phone]

## 2019-04-03 ENCOUNTER — Other Ambulatory Visit: Payer: Self-pay | Admitting: Cardiology

## 2019-04-03 DIAGNOSIS — I25119 Atherosclerotic heart disease of native coronary artery with unspecified angina pectoris: Secondary | ICD-10-CM

## 2019-04-09 ENCOUNTER — Telehealth: Payer: Self-pay

## 2019-04-09 NOTE — Telephone Encounter (Signed)
Ok to stop

## 2019-04-09 NOTE — Telephone Encounter (Signed)
Telephone encounter:  Reason for call: pt called and said she is wearing monitor for 12 days and wants to know if she can stop, it is bothering her and she is unable to sleep  Usual provider: MP  Last office visit: 02/22/19  Next office visit: 08/13/19   Last hospitalization:  Current Outpatient Medications on File Prior to Visit  Medication Sig Dispense Refill  . acetaminophen (TYLENOL) 500 MG tablet Take 500 mg by mouth every 6 (six) hours as needed (for pain.).    Marland Kitchen albuterol (VENTOLIN HFA) 108 (90 Base) MCG/ACT inhaler Inhale 2 puffs into the lungs every 6 (six) hours as needed for wheezing or shortness of breath.     Marland Kitchen aspirin EC 81 MG tablet Take 81 mg by mouth daily.     . busPIRone (BUSPAR) 5 MG tablet Take 5 mg by mouth daily.     . citalopram (CELEXA) 40 MG tablet Take 20 mg by mouth 2 (two) times a day.    . clonazePAM (KLONOPIN) 0.5 MG tablet Take 0.5 mg by mouth at bedtime.     Marland Kitchen diltiazem (CARDIZEM CD) 180 MG 24 hr capsule Take 1 capsule (180 mg total) by mouth daily. 90 capsule 2  . fluticasone (FLOVENT DISKUS) 50 MCG/BLIST diskus inhaler Inhale 1 puff into the lungs as needed.     . furosemide (LASIX) 20 MG tablet TAKE 1 TABLET BY MOUTH DAILY 90 tablet 1  . isosorbide mononitrate (IMDUR) 60 MG 24 hr tablet Take 1 tablet (60 mg total) by mouth daily. 90 tablet 3  . levalbuterol (XOPENEX HFA) 45 MCG/ACT inhaler Inhale 1-2 puffs into the lungs every 6 (six) hours as needed for shortness of breath. 1 Inhaler 12  . losartan (COZAAR) 50 MG tablet TAKE 1 TABLET BY MOUTH DAILY 30 tablet 6  . metFORMIN (GLUCOPHAGE) 500 MG tablet Take 500 mg by mouth 2 (two) times daily.    . metoprolol tartrate (LOPRESSOR) 100 MG tablet Take 1 tablet (100 mg total) by mouth 2 (two) times daily. 180 tablet 3  . nitroGLYCERIN (NITROSTAT) 0.4 MG SL tablet Place 0.4 mg under the tongue every 5 (five) minutes x 3 doses as needed for chest pain.     . pantoprazole (PROTONIX) 40 MG tablet TAKE 1 TABLET(40  MG) BY MOUTH DAILY 90 tablet 1  . rosuvastatin (CRESTOR) 40 MG tablet TAKE 1 TABLET BY MOUTH DAILY 90 tablet 1  . spironolactone (ALDACTONE) 50 MG tablet TAKE 1 TABLET(50 MG) BY MOUTH DAILY 30 tablet 3  . Tiotropium Bromide-Olodaterol (STIOLTO RESPIMAT) 2.5-2.5 MCG/ACT AERS Inhale 2 puffs into the lungs daily. 4 g 11  . warfarin (COUMADIN) 7.5 MG tablet TAKE 1 TABLET BY MOUTH EVERY EVENING 60 tablet 1   No current facility-administered medications on file prior to visit.

## 2019-04-19 ENCOUNTER — Ambulatory Visit: Payer: 59 | Admitting: Registered"

## 2019-04-26 ENCOUNTER — Ambulatory Visit: Payer: 59 | Admitting: Registered"

## 2019-04-26 ENCOUNTER — Other Ambulatory Visit: Payer: Self-pay

## 2019-04-26 ENCOUNTER — Ambulatory Visit: Payer: 59 | Admitting: Cardiology

## 2019-04-26 DIAGNOSIS — Z952 Presence of prosthetic heart valve: Secondary | ICD-10-CM

## 2019-04-26 LAB — POCT INR: INR: 3.6 — AB (ref 2.0–3.0)

## 2019-04-26 NOTE — Progress Notes (Signed)
Goal: 2.5-3.5 Indication: Mechanical mitral and aortic valve replacement  Today's INR:3.6 Current dose: 7.5 mg daily  New dose: Same  Next INR check: 3 weeks

## 2019-05-17 ENCOUNTER — Encounter: Payer: 59 | Admitting: Cardiology

## 2019-05-17 NOTE — Progress Notes (Signed)
A user error has taken place: encounter opened in error, closed for administrative reasons.            This encounter was created in error - please disregard.

## 2019-05-18 ENCOUNTER — Ambulatory Visit: Payer: 59 | Admitting: Cardiology

## 2019-05-18 ENCOUNTER — Other Ambulatory Visit: Payer: Self-pay

## 2019-05-18 DIAGNOSIS — Z952 Presence of prosthetic heart valve: Secondary | ICD-10-CM

## 2019-05-18 LAB — POCT INR: INR: 3 (ref 2.0–3.0)

## 2019-05-18 NOTE — Progress Notes (Signed)
Anticoagulation Management Kimberly Hobbs is a 59 y.o. female who reports to the clinic for monitoring of warfarin treatment.    Indication: Hx of Mechanical mitral and aortic valve replacement   Duration: indefinite Supervising physician: [facility] Visit History:  Patient does not report signs/symptoms of bleeding or thromboembolism   Other recent changes: No changes in diet, medications, lifestyle  Anticoagulation Episode Summary    Current INR goal:  2.5-3.5  TTR:  54.6 % (1.1 y)  Next INR check:  06/15/2019  INR from last check:  3.0 (05/18/2019)  Weekly max warfarin dose:    Target end date:  Indefinite  INR check location:    Preferred lab:    Send INR reminders to:     Indications   H/O mitral valve replacement with mechanical valve [Z95.2]       Comments:          Allergies  Allergen Reactions  . Iodinated Diagnostic Agents Other (See Comments)    Patient is unsure of reaction type  . Penicillins Other (See Comments)    Immune to drug , does not work per patient  Did it involve swelling of the face/tongue/throat, SOB, or low BP? No Did it involve sudden or severe rash/hives, skin peeling, or any reaction on the inside of your mouth or nose? No Did you need to seek medical attention at a hospital or doctor's office? No When did it last happen?NOT A TRUE ALLERGY If all above answers are "NO", may proceed with cephalosporin use.     Current Outpatient Medications:  .  acetaminophen (TYLENOL) 500 MG tablet, Take 500 mg by mouth every 6 (six) hours as needed (for pain.)., Disp: , Rfl:  .  albuterol (VENTOLIN HFA) 108 (90 Base) MCG/ACT inhaler, Inhale 2 puffs into the lungs every 6 (six) hours as needed for wheezing or shortness of breath. , Disp: , Rfl:  .  aspirin EC 81 MG tablet, Take 81 mg by mouth daily. , Disp: , Rfl:  .  busPIRone (BUSPAR) 5 MG tablet, Take 5 mg by mouth daily. , Disp: , Rfl:  .  citalopram (CELEXA) 40 MG  tablet, Take 20 mg by mouth 2 (two) times a day., Disp: , Rfl:  .  clonazePAM (KLONOPIN) 0.5 MG tablet, Take 0.5 mg by mouth at bedtime. , Disp: , Rfl:  .  diltiazem (CARDIZEM CD) 180 MG 24 hr capsule, Take 1 capsule (180 mg total) by mouth daily., Disp: 90 capsule, Rfl: 2 .  fluticasone (FLOVENT DISKUS) 50 MCG/BLIST diskus inhaler, Inhale 1 puff into the lungs as needed. , Disp: , Rfl:  .  furosemide (LASIX) 20 MG tablet, TAKE 1 TABLET BY MOUTH DAILY, Disp: 90 tablet, Rfl: 1 .  isosorbide mononitrate (IMDUR) 60 MG 24 hr tablet, Take 1 tablet (60 mg total) by mouth daily., Disp: 90 tablet, Rfl: 3 .  levalbuterol (XOPENEX HFA) 45 MCG/ACT inhaler, Inhale 1-2 puffs into the lungs every 6 (six) hours as needed for shortness of breath., Disp: 1 Inhaler, Rfl: 12 .  losartan (COZAAR) 50 MG tablet, TAKE 1 TABLET BY MOUTH DAILY, Disp: 30 tablet, Rfl: 6 .  metFORMIN (GLUCOPHAGE) 500 MG tablet, Take 500 mg by mouth 2 (two) times daily., Disp: , Rfl:  .  metoprolol tartrate (LOPRESSOR) 100 MG tablet, Take 1 tablet (100 mg total) by mouth 2 (two) times daily., Disp: 180 tablet, Rfl: 3 .  nitroGLYCERIN (NITROSTAT) 0.4 MG SL tablet, Place 0.4 mg under the  tongue every 5 (five) minutes x 3 doses as needed for chest pain. , Disp: , Rfl:  .  pantoprazole (PROTONIX) 40 MG tablet, TAKE 1 TABLET(40 MG) BY MOUTH DAILY, Disp: 90 tablet, Rfl: 1 .  rosuvastatin (CRESTOR) 40 MG tablet, TAKE 1 TABLET BY MOUTH DAILY, Disp: 90 tablet, Rfl: 1 .  spironolactone (ALDACTONE) 50 MG tablet, TAKE 1 TABLET(50 MG) BY MOUTH DAILY, Disp: 30 tablet, Rfl: 3 .  Tiotropium Bromide-Olodaterol (STIOLTO RESPIMAT) 2.5-2.5 MCG/ACT AERS, Inhale 2 puffs into the lungs daily., Disp: 4 g, Rfl: 11 .  warfarin (COUMADIN) 7.5 MG tablet, TAKE 1 TABLET BY MOUTH EVERY EVENING, Disp: 60 tablet, Rfl: 1 Past Medical History:  Diagnosis Date  . COVID-19   . Hyperlipidemia   . Hypertension   . Rheumatic heart disease    MS/ AS    ASSESSMENT  Recent  Results: The most recent result is correlated with 52.5 mg per week:  Lab Results  Component Value Date   INR 3.0 05/18/2019   INR 3.6 (A) 04/26/2019   INR 3.3 (A) 03/29/2019    Anticoagulation Dosing: Description   INR at goal. Continue current dose of 7.5 mg every day. Recheck in 1 month.       INR today: Therapeutic. Pt previously stable on current dose.   PLAN Weekly dose was unchanged. Continue current dose of 7.5 mg every day. Recheck in 1 month.   Patient Instructions  INR at goal. Continue current dose of 7.5 mg every day. Recheck in 1 month.   Patient advised to contact clinic or seek medical attention if signs/symptoms of bleeding or thromboembolism occur.  Patient verbalized understanding by repeating back information and was advised to contact me if further medication-related questions arise.   Follow-up Return in about 4 weeks (around 06/15/2019).  [name], PharmD  15 minutes spent face-to-face with the patient during the encounter. 50% of time spent on education, including signs/sx bleeding and clotting, as well as food and drug interactions with warfarin. 50% of time was spent on fingerprick POC INR sample collection,processing, results determination, and documentation

## 2019-05-18 NOTE — Patient Instructions (Signed)
INR at goal. Continue current dose of 7.5 mg every day. Recheck in 1 month.

## 2019-05-24 ENCOUNTER — Ambulatory Visit: Payer: 59 | Admitting: Adult Health

## 2019-05-27 ENCOUNTER — Other Ambulatory Visit: Payer: Self-pay | Admitting: Cardiology

## 2019-05-27 DIAGNOSIS — R079 Chest pain, unspecified: Secondary | ICD-10-CM

## 2019-06-15 ENCOUNTER — Other Ambulatory Visit: Payer: Self-pay

## 2019-06-15 ENCOUNTER — Ambulatory Visit: Payer: 59 | Admitting: Pharmacist

## 2019-06-15 DIAGNOSIS — Z952 Presence of prosthetic heart valve: Secondary | ICD-10-CM

## 2019-06-15 LAB — POCT INR: INR: 2.5 (ref 2.0–3.0)

## 2019-06-15 NOTE — Progress Notes (Signed)
Anticoagulation Management Kimberly Hobbs is a 59 y.o. female who reports to the clinic for monitoring of warfarin treatment.    Indication: Hx of Mechanical mitral and aortic valve replacement   Duration: indefinite Supervising physician: [facility] Visit History:  Patient does not report signs/symptoms of bleeding or thromboembolism   Other recent changes: No changes in diet, medications, lifestyle. Pt has made significant changes in her diet and lifestyle since last check. Has cut of sugar and controlled carb intake. Lost 10 lbs over the last 1-2 months. Has been eating a cup of iceberg lettuce a day with tomatoes and ranch. Started exercising 30-60 mins a day consisting of walking on her treadmill, steps, or planks. Exercising 6x week currently. Pt would like to increase her Vit K intake to include spinach 2-3x/week and avocado toast 1x/week moving forward   Anticoagulation Episode Summary    Current INR goal:  2.5-3.5  TTR:  57.6 % (1.2 y)  Next INR check:  07/01/2019  INR from last check:  2.5 (06/15/2019)  Weekly max warfarin dose:    Target end date:  Indefinite  INR check location:    Preferred lab:    Send INR reminders to:     Indications   H/O mitral valve replacement with mechanical valve [Z95.2]       Comments:          Allergies  Allergen Reactions  . Iodinated Diagnostic Agents Other (See Comments)    Patient is unsure of reaction type  . Penicillins Other (See Comments)    Immune to drug , does not work per patient  Did it involve swelling of the face/tongue/throat, SOB, or low BP? No Did it involve sudden or severe rash/hives, skin peeling, or any reaction on the inside of your mouth or nose? No Did you need to seek medical attention at a hospital or doctor's office? No When did it last happen?NOT A TRUE ALLERGY If all above answers are "NO", may proceed with cephalosporin use.     Current Outpatient Medications:  .   acetaminophen (TYLENOL) 500 MG tablet, Take 500 mg by mouth every 6 (six) hours as needed (for pain.)., Disp: , Rfl:  .  albuterol (VENTOLIN HFA) 108 (90 Base) MCG/ACT inhaler, Inhale 2 puffs into the lungs every 6 (six) hours as needed for wheezing or shortness of breath. , Disp: , Rfl:  .  aspirin EC 81 MG tablet, Take 81 mg by mouth daily. , Disp: , Rfl:  .  busPIRone (BUSPAR) 5 MG tablet, Take 5 mg by mouth daily. , Disp: , Rfl:  .  citalopram (CELEXA) 40 MG tablet, Take 20 mg by mouth 2 (two) times a day., Disp: , Rfl:  .  clonazePAM (KLONOPIN) 0.5 MG tablet, Take 0.5 mg by mouth at bedtime. , Disp: , Rfl:  .  diltiazem (CARDIZEM CD) 180 MG 24 hr capsule, Take 1 capsule (180 mg total) by mouth daily., Disp: 90 capsule, Rfl: 2 .  fluticasone (FLOVENT DISKUS) 50 MCG/BLIST diskus inhaler, Inhale 1 puff into the lungs as needed. , Disp: , Rfl:  .  furosemide (LASIX) 20 MG tablet, TAKE 1 TABLET BY MOUTH DAILY, Disp: 90 tablet, Rfl: 1 .  isosorbide mononitrate (IMDUR) 60 MG 24 hr tablet, TAKE 1 TABLET BY MOUTH DAILY, Disp: 90 tablet, Rfl: 3 .  levalbuterol (XOPENEX HFA) 45 MCG/ACT inhaler, Inhale 1-2 puffs into the lungs every 6 (six) hours as needed for shortness of breath., Disp: 1  Inhaler, Rfl: 12 .  losartan (COZAAR) 50 MG tablet, TAKE 1 TABLET BY MOUTH DAILY, Disp: 30 tablet, Rfl: 6 .  metFORMIN (GLUCOPHAGE) 500 MG tablet, Take 500 mg by mouth 2 (two) times daily., Disp: , Rfl:  .  metoprolol tartrate (LOPRESSOR) 100 MG tablet, Take 1 tablet (100 mg total) by mouth 2 (two) times daily., Disp: 180 tablet, Rfl: 3 .  nitroGLYCERIN (NITROSTAT) 0.4 MG SL tablet, Place 0.4 mg under the tongue every 5 (five) minutes x 3 doses as needed for chest pain. , Disp: , Rfl:  .  pantoprazole (PROTONIX) 40 MG tablet, TAKE 1 TABLET(40 MG) BY MOUTH DAILY, Disp: 90 tablet, Rfl: 1 .  rosuvastatin (CRESTOR) 40 MG tablet, TAKE 1 TABLET BY MOUTH DAILY, Disp: 90 tablet, Rfl: 1 .  spironolactone (ALDACTONE) 50 MG tablet,  TAKE 1 TABLET(50 MG) BY MOUTH DAILY, Disp: 30 tablet, Rfl: 3 .  Tiotropium Bromide-Olodaterol (STIOLTO RESPIMAT) 2.5-2.5 MCG/ACT AERS, Inhale 2 puffs into the lungs daily., Disp: 4 g, Rfl: 11 .  warfarin (COUMADIN) 7.5 MG tablet, TAKE 1 TABLET BY MOUTH EVERY EVENING, Disp: 60 tablet, Rfl: 1 Past Medical History:  Diagnosis Date  . COVID-19   . Hyperlipidemia   . Hypertension   . Rheumatic heart disease    MS/ AS    ASSESSMENT  Recent Results: The most recent result is correlated with 52.5 mg per week:  Lab Results  Component Value Date   INR 2.5 06/15/2019   INR 3.0 05/18/2019   INR 3.6 (A) 04/26/2019    Anticoagulation Dosing: Description   INR at goal. Increase weekly dose to 12.5 mg on Tues and 7.5 mg all other days. Recheck in 2 weeks     INR today: Therapeutic. Pt previously stable on current dose. INR trending down since 3/08. Recent plans for increase VItK intake warrents further dose increase and close monitoring to ensure that pt remains therapeutic. New Vitk intake goal of 2-3 serving of spinach/week + 1 serving of avocado.   PLAN Weekly dose was increased by 9.5% to 12.5 mg every Tues and 7.5 mg all other days. Recheck in 2 weeks   Patient Instructions  INR at goal. Increase weekly dose to 12.5 mg on Tues and 7.5 mg all other days. Recheck in 2 weeks  Patient advised to contact clinic or seek medical attention if signs/symptoms of bleeding or thromboembolism occur.  Patient verbalized understanding by repeating back information and was advised to contact me if further medication-related questions arise.   Follow-up Return in about 16 days (around 07/01/2019).  [name], PharmD  15 minutes spent face-to-face with the patient during the encounter. 50% of time spent on education, including signs/sx bleeding and clotting, as well as food and drug interactions with warfarin. 50% of time was spent on fingerprick POC INR sample collection,processing, results  determination, and documentation

## 2019-06-15 NOTE — Patient Instructions (Signed)
INR at goal. Increase weekly dose to 12.5 mg on Tues and 7.5 mg all other days. Recheck in 2 weeks

## 2019-06-22 ENCOUNTER — Other Ambulatory Visit: Payer: Self-pay

## 2019-06-22 DIAGNOSIS — I25708 Atherosclerosis of coronary artery bypass graft(s), unspecified, with other forms of angina pectoris: Secondary | ICD-10-CM

## 2019-06-22 DIAGNOSIS — I10 Essential (primary) hypertension: Secondary | ICD-10-CM

## 2019-07-02 ENCOUNTER — Ambulatory Visit: Payer: 59 | Admitting: Pharmacist

## 2019-07-02 ENCOUNTER — Other Ambulatory Visit: Payer: Self-pay

## 2019-07-02 ENCOUNTER — Other Ambulatory Visit: Payer: Self-pay | Admitting: Cardiology

## 2019-07-02 DIAGNOSIS — Z952 Presence of prosthetic heart valve: Secondary | ICD-10-CM

## 2019-07-02 LAB — POCT INR: INR: 3.2 — AB (ref 2.0–3.0)

## 2019-07-02 NOTE — Progress Notes (Signed)
Anticoagulation Management Kimberly Hobbs is a 59 y.o. female who reports to the clinic for monitoring of warfarin treatment.    Indication: Hx of Mechanical mitral and aortic valve replacement   Duration: indefinite Supervising physician: [facility] Visit History:  Patient does not report signs/symptoms of bleeding or thromboembolism   Other recent changes: No changes in diet, medications, lifestyle. Pt has made significant changes in her diet and lifestyle since last check. Has cut of sugar and controlled carb intake. Lost 10 lbs over the last 1-2 months. Has been eating a cup of iceberg lettuce a day with tomatoes and ranch. Started exercising 30-60 mins a day consisting of walking on her treadmill, inclined steps, or planks. Pt maintaining steady Vitk intake of spinach 2-3x/week and avocado toast 1x/week. Pt interested in drinking 1 glass of wine over the weekend moving forward. Pt aware to limit to to 1 glass and aware that it may potentially increase her INR and bleeding risk.   Anticoagulation Episode Summary    Current INR goal:  2.5-3.5  TTR:  59.2 % (1.2 y)  Next INR check:  07/27/2019  INR from last check:  3.2 (07/02/2019)  Weekly max warfarin dose:    Target end date:  Indefinite  INR check location:    Preferred lab:    Send INR reminders to:     Indications   H/O mitral valve replacement with mechanical valve [Z95.2]       Comments:          Allergies  Allergen Reactions  . Iodinated Diagnostic Agents Other (See Comments)    Patient is unsure of reaction type  . Penicillins Other (See Comments)    Immune to drug , does not work per patient  Did it involve swelling of the face/tongue/throat, SOB, or low BP? No Did it involve sudden or severe rash/hives, skin peeling, or any reaction on the inside of your mouth or nose? No Did you need to seek medical attention at a hospital or doctor's office? No When did it last happen?NOT A TRUE  ALLERGY If all above answers are "NO", may proceed with cephalosporin use.     Current Outpatient Medications:  .  acetaminophen (TYLENOL) 500 MG tablet, Take 500 mg by mouth every 6 (six) hours as needed (for pain.)., Disp: , Rfl:  .  albuterol (VENTOLIN HFA) 108 (90 Base) MCG/ACT inhaler, Inhale 2 puffs into the lungs every 6 (six) hours as needed for wheezing or shortness of breath. , Disp: , Rfl:  .  aspirin EC 81 MG tablet, Take 81 mg by mouth daily. , Disp: , Rfl:  .  busPIRone (BUSPAR) 5 MG tablet, Take 5 mg by mouth daily. , Disp: , Rfl:  .  citalopram (CELEXA) 40 MG tablet, Take 20 mg by mouth 2 (two) times a day., Disp: , Rfl:  .  clonazePAM (KLONOPIN) 0.5 MG tablet, Take 0.5 mg by mouth at bedtime. , Disp: , Rfl:  .  diltiazem (CARDIZEM CD) 180 MG 24 hr capsule, Take 1 capsule (180 mg total) by mouth daily., Disp: 90 capsule, Rfl: 2 .  fluticasone (FLOVENT DISKUS) 50 MCG/BLIST diskus inhaler, Inhale 1 puff into the lungs as needed. , Disp: , Rfl:  .  furosemide (LASIX) 20 MG tablet, TAKE 1 TABLET BY MOUTH DAILY, Disp: 90 tablet, Rfl: 1 .  isosorbide mononitrate (IMDUR) 60 MG 24 hr tablet, TAKE 1 TABLET BY MOUTH DAILY, Disp: 90 tablet, Rfl: 3 .  Lancets (  ONETOUCH DELICA PLUS Q000111Q) MISC, TAKE AS DIRECTED ONCE DAILY., Disp: , Rfl:  .  levalbuterol (XOPENEX HFA) 45 MCG/ACT inhaler, Inhale 1-2 puffs into the lungs every 6 (six) hours as needed for shortness of breath., Disp: 1 Inhaler, Rfl: 12 .  losartan (COZAAR) 50 MG tablet, TAKE 1 TABLET BY MOUTH DAILY, Disp: 30 tablet, Rfl: 0 .  metFORMIN (GLUCOPHAGE) 500 MG tablet, Take 500 mg by mouth 2 (two) times daily., Disp: , Rfl:  .  metoprolol tartrate (LOPRESSOR) 100 MG tablet, Take 1 tablet (100 mg total) by mouth 2 (two) times daily., Disp: 180 tablet, Rfl: 3 .  nitroGLYCERIN (NITROSTAT) 0.4 MG SL tablet, Place 0.4 mg under the tongue every 5 (five) minutes x 3 doses as needed for chest pain. , Disp: , Rfl:  .  ONETOUCH VERIO test  strip, AS DIRECTED ONCE A DAY, Disp: , Rfl:  .  pantoprazole (PROTONIX) 40 MG tablet, TAKE 1 TABLET(40 MG) BY MOUTH DAILY, Disp: 90 tablet, Rfl: 1 .  rosuvastatin (CRESTOR) 40 MG tablet, TAKE 1 TABLET BY MOUTH DAILY, Disp: 90 tablet, Rfl: 1 .  spironolactone (ALDACTONE) 50 MG tablet, TAKE 1 TABLET(50 MG) BY MOUTH DAILY, Disp: 30 tablet, Rfl: 3 .  Tiotropium Bromide-Olodaterol (STIOLTO RESPIMAT) 2.5-2.5 MCG/ACT AERS, Inhale 2 puffs into the lungs daily., Disp: 4 g, Rfl: 11 .  warfarin (COUMADIN) 5 MG tablet, Take 5 mg by mouth daily. Take 1 tablet by mouth every Tues. Refer to most recent anticoagulation note for most accurate information., Disp: , Rfl:  .  warfarin (COUMADIN) 7.5 MG tablet, TAKE 1 TABLET BY MOUTH EVERY EVENING, Disp: 60 tablet, Rfl: 1 Past Medical History:  Diagnosis Date  . COVID-19   . Hyperlipidemia   . Hypertension   . Rheumatic heart disease    MS/ AS    ASSESSMENT  Recent Results: The most recent result is correlated with 57.5 mg per week:  Lab Results  Component Value Date   INR 3.2 (A) 07/02/2019   INR 2.5 06/15/2019   INR 3.0 05/18/2019    Anticoagulation Dosing: Description   INR at goal. Continue current dose of 12.5 mg on Tues and 7.5 mg all other days. Recheck in 4 weeks     INR today: Therapeutic. Pt remains therapeutic with the dose increase last check. Reports to be able to maintain the Vit K intake as previously discussed. Reviewed new diet changes and alcohol intake with the pt. Pt aware to continue steady diet intake.   PLAN Weekly dose was unchanged. Continue taking 12.5 mg every Tues and 7.5 mg all other days. Recheck in 4 weeks   Patient Instructions  INR at goal. Continue current dose of 12.5 mg on Tues and 7.5 mg all other days. Recheck in 4 weeks  Patient advised to contact clinic or seek medical attention if signs/symptoms of bleeding or thromboembolism occur.  Patient verbalized understanding by repeating back information and was  advised to contact me if further medication-related questions arise.   Follow-up Return in about 25 days (around 07/27/2019).  [name], PharmD  15 minutes spent face-to-face with the patient during the encounter. 50% of time spent on education, including signs/sx bleeding and clotting, as well as food and drug interactions with warfarin. 50% of time was spent on fingerprick POC INR sample collection,processing, results determination, and documentation

## 2019-07-02 NOTE — Patient Instructions (Signed)
INR at goal. Continue current dose of 12.5 mg on Tues and 7.5 mg all other days. Recheck in 4 weeks °

## 2019-07-03 ENCOUNTER — Other Ambulatory Visit (HOSPITAL_COMMUNITY)
Admission: RE | Admit: 2019-07-03 | Discharge: 2019-07-03 | Disposition: A | Discharge status: Home / Self Care | Payer: 59 | Source: Ambulatory Visit | Attending: Adult Health | Admitting: Adult Health

## 2019-07-03 DIAGNOSIS — Z20822 Contact with and (suspected) exposure to covid-19: Secondary | ICD-10-CM | POA: Diagnosis not present

## 2019-07-03 DIAGNOSIS — Z01812 Encounter for preprocedural laboratory examination: Secondary | ICD-10-CM | POA: Diagnosis present

## 2019-07-03 LAB — SARS CORONAVIRUS 2 (TAT 6-24 HRS): SARS Coronavirus 2: NEGATIVE

## 2019-07-07 ENCOUNTER — Ambulatory Visit (INDEPENDENT_AMBULATORY_CARE_PROVIDER_SITE_OTHER): Payer: 59 | Admitting: Internal Medicine

## 2019-07-07 ENCOUNTER — Other Ambulatory Visit: Payer: Self-pay

## 2019-07-07 ENCOUNTER — Encounter: Payer: Self-pay | Admitting: Adult Health

## 2019-07-07 ENCOUNTER — Ambulatory Visit (INDEPENDENT_AMBULATORY_CARE_PROVIDER_SITE_OTHER): Payer: 59 | Admitting: Adult Health

## 2019-07-07 ENCOUNTER — Ambulatory Visit (INDEPENDENT_AMBULATORY_CARE_PROVIDER_SITE_OTHER)
Admission: RE | Admit: 2019-07-07 | Discharge: 2019-07-07 | Disposition: A | Discharge status: Home / Self Care | Payer: 59 | Source: Ambulatory Visit | Attending: Adult Health | Admitting: Adult Health

## 2019-07-07 VITALS — BP 112/68 | HR 53 | Ht 65.0 in | Wt 206.0 lb

## 2019-07-07 DIAGNOSIS — R0600 Dyspnea, unspecified: Secondary | ICD-10-CM

## 2019-07-07 DIAGNOSIS — R0609 Other forms of dyspnea: Secondary | ICD-10-CM

## 2019-07-07 DIAGNOSIS — Z87891 Personal history of nicotine dependence: Secondary | ICD-10-CM

## 2019-07-07 DIAGNOSIS — J449 Chronic obstructive pulmonary disease, unspecified: Secondary | ICD-10-CM | POA: Insufficient documentation

## 2019-07-07 DIAGNOSIS — J42 Unspecified chronic bronchitis: Secondary | ICD-10-CM | POA: Diagnosis not present

## 2019-07-07 LAB — PULMONARY FUNCTION TEST
DL/VA % pred: 89 %
DL/VA: 3.78 ml/min/mmHg/L
DLCO cor % pred: 81 %
DLCO cor: 17.22 ml/min/mmHg
DLCO unc % pred: 81 %
DLCO unc: 17.22 ml/min/mmHg
FEF 25-75 Post: 2.45 L/sec
FEF 25-75 Pre: 1.62 L/sec
FEF2575-%Change-Post: 50 %
FEF2575-%Pred-Post: 96 %
FEF2575-%Pred-Pre: 64 %
FEV1-%Change-Post: 10 %
FEV1-%Pred-Post: 81 %
FEV1-%Pred-Pre: 73 %
FEV1-Post: 2.22 L
FEV1-Pre: 2 L
FEV1FVC-%Change-Post: 1 %
FEV1FVC-%Pred-Pre: 97 %
FEV6-%Change-Post: 8 %
FEV6-%Pred-Post: 83 %
FEV6-%Pred-Pre: 76 %
FEV6-Post: 2.83 L
FEV6-Pre: 2.6 L
FEV6FVC-%Pred-Post: 103 %
FEV6FVC-%Pred-Pre: 103 %
FVC-%Change-Post: 8 %
FVC-%Pred-Post: 80 %
FVC-%Pred-Pre: 74 %
FVC-Post: 2.83 L
FVC-Pre: 2.6 L
Post FEV1/FVC ratio: 79 %
Post FEV6/FVC ratio: 100 %
Pre FEV1/FVC ratio: 77 %
Pre FEV6/FVC Ratio: 100 %
RV % pred: 138 %
RV: 2.75 L
TLC % pred: 107 %
TLC: 5.58 L

## 2019-07-07 NOTE — Assessment & Plan Note (Addendum)
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  May also have a asthmatic component.  She has a very strong smoking history. We will continue on Stiolto.  Hold on ICS as she does not have frequent exacerbations. We will check chest x-ray today.  Patient does have daily cough we will add a mucolytic's She has had significant improvement in dyspnea from weight loss and exercise.  So definitely deconditioning was playing a role. On return visit discussed referral to low-dose CT screening program.    Plan  Patient Instructions  May try Mucinex Twice daily  As needed  Cough/congestion  May Try Delsym 2 tsp Twice daily  As needed  Cough .  Continue on Stiolto 2 puffs daily , rinse after use .  Chest xray today .  Keep up good work on exercise.  Follow up with with Dr. Shearon Stalls in 4 months and As needed     n

## 2019-07-07 NOTE — Progress Notes (Signed)
@Patient  ID: Kimberly Hobbs, female    DOB: April 05, 1960, 59 y.o.   MRN: [id number]  Chief Complaint  Patient presents with  . Follow-up    Dyspnea     Referring provider: Lennie Odor, PA  HPI: 59 year old female former smoker (quit 2018) seen for pulmonary consult February 22, 2019 for dyspnea  Medical history significant for coronary artery disease, rheumatic valvular disease status post CABG in 2018 and aortic valve replacement.,  A. Fib COVID-19 infection and Covid pneumonia December 2020  TEST/EVENTS :  Echo TEE June 2020 EF 55 to 60%, right ventricle with normal systolic function, mechanical mitral valve and aortic valve with normal function Abnormal paradoxical septal motion consistent with postoperative status.  Right and left heart cath June 2020 moderate WHO group 2 pulmonary hypertension, LAD 100% mid occlusion, mild distal disease, RCA 100% distal occlusion, SVG-RCA patent, patent grafts moderate to severe native RCA disease.  Chest x-ray December 2020 diffuse bilateral reticular and patchy opacities possible pulmonary edema-x-ray report care everywhere   07/07/2019 Follow up : Dyspnea Patient returns for a 61-month follow-up.  Patient was seen for pulmonary consult with Dr. Shearon Stalls earlier this year in January for dyspnea.  Felt to have multifactorial dyspnea with suspected COPD given her heavy smoking history, deconditioning from weight gain and comorbidities along with underlying history of cardiac disease and pulmonary hypertension. Patient was started on Stiolto daily.  Set up for pulmonary function testing today that shows mild to moderate restriction with an FEV1 at 81%, ratio 79, FVC 80%, 10% bronchodilator change.  Significant mid flow obstruction with  reversibility.,  DLCO 81%.   Patient also had Covid infection hospitalized in December 2020 at [facility] in [place]. With A Fib with RVR and Chest xray showed opacities c/w pulmonary edema.   She follows with  cardiology for above cardiac disease.She remains on Coumadin. Reports no known bleeding .   Is working on weight loss, walking 42min , low carb diet.  Has lost 30lbs.   Never tested for sleep apnea, feels rested with no daytime sleepiness.  No snoring . Sleeps well.   Dyspnea has improved dramatically since weight loss and staring to walk daily.  Does have minimally productive cough that worse in evenings.  Unsure if Stiolto has helped but taking it. No flare in wheezing .   Declines Covid vaccine.     Allergies  Allergen Reactions  . Iodinated Diagnostic Agents Other (See Comments)    Patient is unsure of reaction type  . Penicillins Other (See Comments)    Immune to drug , does not work per patient  Did it involve swelling of the face/tongue/throat, SOB, or low BP? No Did it involve sudden or severe rash/hives, skin peeling, or any reaction on the inside of your mouth or nose? No Did you need to seek medical attention at a hospital or doctor's office? No When did it last happen?NOT A TRUE ALLERGY If all above answers are "NO", may proceed with cephalosporin use.     Immunization History  Administered Date(s) Administered  . Influenza-Unspecified 01/14/2019    Past Medical History:  Diagnosis Date  . COVID-19   . Hyperlipidemia   . Hypertension   . Rheumatic heart disease    MS/ AS    Tobacco History: Social History   Tobacco Use  Smoking Status Former Smoker  . Packs/day: 1.50  . Years: 40.00  . Pack years: 60.00  . Types: Cigarettes  . Quit date: 12/23/2016  .  Years since quitting: 2.5  Smokeless Tobacco Never Used   Counseling given: Not Answered   Outpatient Medications Prior to Visit  Medication Sig Dispense Refill  . acetaminophen (TYLENOL) 500 MG tablet Take 500 mg by mouth every 6 (six) hours as needed (for pain.).    Marland Kitchen albuterol (VENTOLIN HFA) 108 (90 Base) MCG/ACT inhaler Inhale 2 puffs into the lungs every 6 (six) hours as needed for  wheezing or shortness of breath.     Marland Kitchen aspirin EC 81 MG tablet Take 81 mg by mouth daily.     . busPIRone (BUSPAR) 5 MG tablet Take 5 mg by mouth daily.     . citalopram (CELEXA) 40 MG tablet Take 20 mg by mouth 2 (two) times a day.    . clonazePAM (KLONOPIN) 0.5 MG tablet Take 0.5 mg by mouth at bedtime.     Marland Kitchen diltiazem (CARDIZEM CD) 180 MG 24 hr capsule Take 1 capsule (180 mg total) by mouth daily. 90 capsule 2  . furosemide (LASIX) 20 MG tablet TAKE 1 TABLET BY MOUTH DAILY 90 tablet 1  . isosorbide mononitrate (IMDUR) 60 MG 24 hr tablet TAKE 1 TABLET BY MOUTH DAILY 90 tablet 3  . Lancets (ONETOUCH DELICA PLUS Q000111Q) MISC TAKE AS DIRECTED ONCE DAILY.    Marland Kitchen levalbuterol (XOPENEX HFA) 45 MCG/ACT inhaler Inhale 1-2 puffs into the lungs every 6 (six) hours as needed for shortness of breath. 1 Inhaler 12  . losartan (COZAAR) 50 MG tablet TAKE 1 TABLET BY MOUTH DAILY 30 tablet 0  . metFORMIN (GLUCOPHAGE) 500 MG tablet Take 500 mg by mouth 2 (two) times daily.    . nitroGLYCERIN (NITROSTAT) 0.4 MG SL tablet Place 0.4 mg under the tongue every 5 (five) minutes x 3 doses as needed for chest pain.     Glory Rosebush VERIO test strip AS DIRECTED ONCE A DAY    . pantoprazole (PROTONIX) 40 MG tablet TAKE 1 TABLET(40 MG) BY MOUTH DAILY 90 tablet 1  . rosuvastatin (CRESTOR) 40 MG tablet TAKE 1 TABLET BY MOUTH DAILY 90 tablet 1  . spironolactone (ALDACTONE) 50 MG tablet TAKE 1 TABLET(50 MG) BY MOUTH DAILY 30 tablet 3  . Tiotropium Bromide-Olodaterol (STIOLTO RESPIMAT) 2.5-2.5 MCG/ACT AERS Inhale 2 puffs into the lungs daily. 4 g 11  . warfarin (COUMADIN) 5 MG tablet Take 5 mg by mouth daily. Take 1 tablet by mouth every Tues. Refer to most recent anticoagulation note for most accurate information.    Marland Kitchen warfarin (COUMADIN) 7.5 MG tablet TAKE 1 TABLET BY MOUTH EVERY EVENING 60 tablet 1  . fluticasone (FLOVENT DISKUS) 50 MCG/BLIST diskus inhaler Inhale 1 puff into the lungs as needed.     . metoprolol tartrate  (LOPRESSOR) 100 MG tablet Take 1 tablet (100 mg total) by mouth 2 (two) times daily. 180 tablet 3   No facility-administered medications prior to visit.     Review of Systems:   Constitutional:   No  weight loss, night sweats,  Fevers, chills, + fatigue, or  lassitude.  HEENT:   No headaches,  Difficulty swallowing,  Tooth/dental problems, or  Sore throat,                No sneezing, itching, ear ache, nasal congestion, post nasal drip,   CV:  No chest pain,  Orthopnea, PND, swelling in lower extremities, anasarca, dizziness, palpitations, syncope.   GI  No heartburn, indigestion, abdominal pain, nausea, vomiting, diarrhea, change in bowel habits, loss of appetite, bloody stools.  Resp:   No coughing up of blood.  No change in color of mucus.  No wheezing.  No chest wall deformity  Skin: no rash or lesions.  GU: no dysuria, change in color of urine, no urgency or frequency.  No flank pain, no hematuria   MS:  No joint pain or swelling.  No decreased range of motion.  No back pain.    Physical Exam  BP 112/68 (BP Location: Left Arm, Cuff Size: Normal)   Pulse (!) 53   Ht 5\' 5"  (1.651 m)   Wt 206 lb (93.4 kg)   SpO2 96%   BMI 34.28 kg/m   GEN: A/Ox3; pleasant , NAD, well nourished    HEENT:  [place]/AT,    NOSE-clear, THROAT-clear, no lesions, no postnasal drip or exudate noted.   NECK:  Supple w/ fair ROM; no JVD; normal carotid impulses w/o bruits; no thyromegaly or nodules palpated; no lymphadenopathy.    RESP  Clear  P & A; w/o, wheezes/ rales/ or rhonchi. no accessory muscle use, no dullness to percussion +click (AVR/MVR )   CARD:  RRR, no m/r/g, no peripheral edema, pulses intact, no cyanosis or clubbing.  GI:   Soft & nt; nml bowel sounds; no organomegaly or masses detected.   Musco: Warm bil, no deformities or joint swelling noted.   Neuro: alert, no focal deficits noted.    Skin: Warm, no lesions or rashes    Lab Results:  CBC  BMET  BNP No results  found for: BNP  ProBNP No results found for: PROBNP  Imaging: No results found.    PFT Results Latest Ref Rng & Units 07/07/2019  FVC-Pre L 2.60  FVC-Predicted Pre % 74  FVC-Post L 2.83  FVC-Predicted Post % 80  Pre FEV1/FVC % % 77  Post FEV1/FCV % % 79  FEV1-Pre L 2.00  FEV1-Predicted Pre % 73  FEV1-Post L 2.22  DLCO UNC% % 81  DLCO COR %Predicted % 89  TLC L 5.58  TLC % Predicted % 107  RV % Predicted % 138    No results found for: NITRICOXIDE      Assessment & Plan:   COPD (chronic obstructive pulmonary disease) ([place]) Suspect she has some mixed disease with PFT showing mild restriction and small airways disease.  May also have a asthmatic component.  She has a very strong smoking history. We will continue on Stiolto.  Hold on ICS as she does not have frequent exacerbations. We will check chest x-ray today.  Patient does have daily cough we will add a mucolytic's She has had significant improvement in dyspnea from weight loss and exercise.  So definitely deconditioning was playing a role. On return visit discussed referral to low-dose CT screening program.    Plan  Patient Instructions  May try Mucinex Twice daily  As needed  Cough/congestion  May Try Delsym 2 tsp Twice daily  As needed  Cough .  Continue on Stiolto 2 puffs daily , rinse after use .  Chest xray today .  Keep up good work on exercise.  Follow up with with Dr. Shearon Stalls in 4 months and As needed     n   Exertional dyspnea Dyspnea has improved since weight loss and exercise.  Suspect is multifactorial with underlying significant cardiac history and pulmonary hypertension.  She denies any symptoms of obstructive sleep apnea hold on sleep study for now. Would continue follow-up with cardiology and her current regimen with diuretics.  Continue on weight  loss and exercise as tolerated. We will continue to treat for underlying COPD with [facility]  Plan  Patient Instructions  May try Mucinex Twice daily   As needed  Cough/congestion  May Try Delsym 2 tsp Twice daily  As needed  Cough .  Continue on Stiolto 2 puffs daily , rinse after use .  Chest xray today .  Keep up good work on exercise.  Follow up with with Dr. Shearon Stalls in 4 months and As needed     '      Rosalin Buster, NP 07/07/2019

## 2019-07-07 NOTE — Assessment & Plan Note (Signed)
Dyspnea has improved since weight loss and exercise.  Suspect is multifactorial with underlying significant cardiac history and pulmonary hypertension.  She denies any symptoms of obstructive sleep apnea hold on sleep study for now. Would continue follow-up with cardiology and her current regimen with diuretics.  Continue on weight loss and exercise as tolerated. We will continue to treat for underlying COPD with [facility]  Plan  Patient Instructions  May try Mucinex Twice daily  As needed  Cough/congestion  May Try Delsym 2 tsp Twice daily  As needed  Cough .  Continue on Stiolto 2 puffs daily , rinse after use .  Chest xray today .  Keep up good work on exercise.  Follow up with with Dr. Shearon Stalls in 4 months and As needed     '

## 2019-07-07 NOTE — Patient Instructions (Signed)
May try Mucinex Twice daily  As needed  Cough/congestion  May Try Delsym 2 tsp Twice daily  As needed  Cough .  Continue on Stiolto 2 puffs daily , rinse after use .  Chest xray today .  Keep up good work on exercise.  Follow up with with Dr. Shearon Stalls in 4 months and As needed

## 2019-07-07 NOTE — Progress Notes (Signed)
Full PFT performed today. °

## 2019-07-09 ENCOUNTER — Telehealth: Payer: Self-pay

## 2019-07-09 NOTE — Telephone Encounter (Signed)
Pt called to inform us that when she takes the spironolactone her legs start to swell up and has lots of pain. Pt would like to see if you can take her off of this medication. Please advise.

## 2019-07-09 NOTE — Telephone Encounter (Signed)
Make appt 5/26

## 2019-07-14 ENCOUNTER — Ambulatory Visit: Payer: 59 | Admitting: Cardiology

## 2019-07-20 ENCOUNTER — Other Ambulatory Visit: Payer: Self-pay | Admitting: Cardiology

## 2019-07-22 NOTE — Progress Notes (Signed)
Subjective:   BRYCELYN RAK, female    DOB: 07/10/1960, 59 y.o.   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  Patient recently had pain in bilateral knees, mostly at night and at rest.  She wondered if this is related to spironolactone.  Thus, she made today's appointment. Otherwise, she is doing well with no significant chest pain or shortness of breath symptoms.  She is walking regularly and has lost weight with improvement in her symptoms. She has only occasional, short lasing palpitations symptoms lasting for 1-2 seconds.   Current Outpatient Medications on File Prior to Visit  Medication Sig Dispense Refill  . acetaminophen (TYLENOL) 500 MG tablet Take 500 mg by mouth every 6 (six) hours as needed (for pain.).    Marland Kitchen albuterol (VENTOLIN HFA) 108 (90 Base) MCG/ACT inhaler Inhale 2 puffs into the lungs every 6 (six) hours as needed for wheezing or shortness of breath.     Marland Kitchen aspirin EC 81 MG tablet Take 81 mg by mouth daily.     . busPIRone (BUSPAR) 5 MG tablet Take 5 mg by mouth daily.     . citalopram (CELEXA) 40 MG tablet Take 20 mg by mouth 2 (two) times a day.    . clonazePAM (KLONOPIN) 0.5 MG tablet Take 0.5 mg by mouth at bedtime.     . clopidogrel (PLAVIX) 75 MG tablet TAKE 4 TABLET BY MOUTH TODAY AND THEN 1 TABLET DAILY 30 tablet 0  . diltiazem (CARDIZEM CD) 180 MG 24 hr capsule Take 1 capsule (180 mg total) by mouth daily. 90 capsule 2  . furosemide (LASIX) 20 MG tablet TAKE 1 TABLET BY MOUTH DAILY 90 tablet 1  . isosorbide mononitrate (IMDUR) 60 MG 24 hr tablet TAKE 1 TABLET BY MOUTH DAILY 90 tablet 3  . Lancets (ONETOUCH DELICA PLUS Q000111Q) MISC TAKE AS DIRECTED ONCE  DAILY.    Marland Kitchen levalbuterol (XOPENEX HFA) 45 MCG/ACT inhaler Inhale 1-2 puffs into the lungs every 6 (six) hours as needed for shortness of breath. 1 Inhaler 12  . losartan (COZAAR) 50 MG tablet TAKE 1 TABLET BY MOUTH DAILY 30 tablet 0  . metFORMIN (GLUCOPHAGE) 500 MG tablet Take 500 mg by mouth 2 (two) times daily.    . metoprolol tartrate (LOPRESSOR) 100 MG tablet Take 1 tablet (100 mg total) by mouth 2 (two) times daily. 180 tablet 3  . nitroGLYCERIN (NITROSTAT) 0.4 MG SL tablet Place 0.4 mg under the tongue every 5 (five) minutes x 3 doses as needed for chest pain.     Glory Rosebush VERIO test strip AS DIRECTED ONCE A DAY    . pantoprazole (PROTONIX) 40 MG tablet TAKE 1 TABLET(40 MG) BY MOUTH DAILY 90 tablet 1  . rosuvastatin (CRESTOR) 40 MG tablet TAKE 1 TABLET BY MOUTH DAILY 90 tablet 1  . spironolactone (ALDACTONE) 50 MG tablet TAKE 1 TABLET(50 MG) BY MOUTH DAILY 30 tablet 3  . Tiotropium Bromide-Olodaterol (STIOLTO RESPIMAT) 2.5-2.5 MCG/ACT AERS Inhale 2 puffs into the lungs daily. 4 g 11  . warfarin (COUMADIN) 5 MG tablet Take 5 mg by mouth daily. Take 1 tablet by mouth every Tues. Refer to most recent anticoagulation note for most accurate information.    Marland Kitchen  warfarin (COUMADIN) 7.5 MG tablet TAKE 1 TABLET BY MOUTH EVERY EVENING 60 tablet 1   No current facility-administered medications on file prior to visit.    Cardiovascular studies:  EKG 02/22/2019: Probable sinus rhythm 63 bpm. 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 Poor R wave progression.  Coronary and bypass graft angiography 07/21/2018 and 08/18/2018: LM: Normal LAD: 100% mid occlusion. Mild distal disease LIMA-LAD: Patent LCx: Normal RCA: Prox 60% stenosis, mid 30-40% stenoses. TIMI III flow in prox-mid RCA. 100% distal occlusion SVG-RCA: Patent. Ostial 40%. Distal RCA moderate diffuse disease.   Guide catheter angiography provided superior images. There is TIMI III flow in SVG which fills RCA all the way to the ostium. Even if  she were to have FFR positive lesion in the graft, I do not think the benefits of a stent would outweigh the risk of losing the graft and the entire RCA territory circulation in a borderline lesion. Also, the prox RCA lesion is well bypassed by the SVG graft. Thus, I decided not to perform FFR/PCI to either of these lesions. Continue medical management.   [place] 07/21/2018: #1: RA: 18 mmHg RV 90/12 mmHg PA: 94/40 mmHg. Mean PA 63 mmHg. PW 33 mmHg  Lasix 80 mg  #2: RA 19 mmHg RV 60/0 mmHg PA: 56/16 mmHg. Mean PA 36 mmHg PW: 25 mmHg  CO: 5.4 L/min. CI 2.6 L/min/m2 PVR 2 WU  Impression: Elevated filling pressures Moderate WHO Grp II pulmonary hypertension Improvement in filling pressures with diuresis.  Echocardiogram 02/26/2018: Left ventricle cavity is normal in size. Mild concentric hypertrophy of the left ventricle. Abnormal septal wall motion due to post-operative valve. Diastolic function could not be assessed due to post op valve and CABG status.  Calculated EF 63%. Left atrial cavity is moderate to severely dilated measures 4.5 cm in long axis. Right atrial cavity is mildly dilated. Mechanical aortic valve with trace regurgitation. Mild aortic valve leaflet calcification. Mildly restricted aortic valve leaflets. Mild to moderate aortic valve stenosis. Aortic valve peak pressure gradient of  43 and mean gradient of 21 mmHg, calculated aortic valve area    0.88 cm. Mechanical mitral valve with trace regurgitation. Moderately restricted mitral valve leaflets. Mild mitral valve stenosis. Mitral valve peak pressure gradient of  26  and mean gradient of  6.2  mmHg, calculated mitral valve area 1.9   cm. Mild to moderate tricuspid regurgitation. Mild pulmonary hypertension. PA systolic pressure estimated at 39 mm Hg. Compared to the study done on 08/06/2017, no significant change.  TEE 07/21/2018:  1. The left ventricle has normal systolic function, with an ejection fraction of 55-60%.  There is abnormal septal motion consistent with post-operative status.  2. The right ventricle has normal systolc function.  3. A 25 mm Regent mechanical valve is present in the mitral position. Procedure Date: 2018 Echo findings are consistent with is functioning normally the mitral prosthesis. Normal mitral valve prosthesis. Mean PG 6 mmHg, MVA 1.7 cm2. No thrombus seen.  (Patient known to have subtherapeutic INR).  4. A 80mm [employer] Jude mechanical prosthesis valve is present in the aortic position. Procedure Date: 2018 Normal aortic valve prosthesis. Echo findings shows no evidence of Accelration time of 88 msec suggests normal functioning valve. Mean PG 25 mmHg  likely due to small annular diameter. No prosthetic valve stenosis noted. of the aortic prosthesis. No thrombus seen. (Patient known to have subtherapeutic INR).  Recent labs: Labs 02/16/2018: BNP 251 elevated  Labs 07/02/2017: A1c 5.7%, HB 12.9/HCT 38.6, platelets 220, normal  indicis.  Serum glucose 90 mg, BUN 23, creatinine 0.75, CMP normal.  Potassium 4.6.  TSH normal.  Review of Systems  Cardiovascular: Negative for chest pain, dyspnea on exertion, leg swelling, palpitations and syncope.  Musculoskeletal: Positive for joint pain.         Vitals:   07/23/19 1137  BP: (!) 132/57  Pulse: (!) 54  Resp: 17  SpO2: 97%    Objective:     Physical Exam  Constitutional: No distress.  Neck: No JVD present.  Cardiovascular: Normal rate, regular rhythm and intact distal pulses.  Murmur heard.  Harsh midsystolic murmur is present with a grade of 2/6 at the upper right sternal border radiating to the neck. Crisp metallic S1 & S2. No diastolic murmur heard.  Pulmonary/Chest: Effort normal and breath sounds normal. She has no wheezes. She has no rales.  Musculoskeletal:        General: No edema.  Lymphadenopathy:    She has no cervical adenopathy.  Neurological: No cranial nerve deficit.  Psychiatric: She has a normal mood and  affect.  Nursing note and vitals reviewed.        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  Leg pain: Normal vascular exam.  I do not expect spironolactone to cause hypokalemia, thereby causing leg cramps.  I do not think symptoms are related to spironolactone.  Suspect arthritis are restless leg syndrome.  Recommend follow-up with PCP regarding the same.  CAD of native and bypass grafts with angina, S/p MVR, AVR, subtherapeutic INR: Patent grafts with non-critical disease. Continue medical management with Aspirin, Imdur 60 mg daiy, losartan 50 mg daily, metoprolol tartarate 100 mg bid, crestor 40 mg daily.  Paroxysmal Afib: Now also on diltiazem CD 180 mg daily. Maintaining sinus rhythm.   S/p MVR, AVR: Continue f/u in INR clinic  Hypertension: Controlled.  F/u w/me in 6 months.  Nigel Mormon, MD [facility] Pager: [phone] Office: [phone] If no answer Cell [phone]

## 2019-07-23 ENCOUNTER — Encounter: Payer: Self-pay | Admitting: Cardiology

## 2019-07-23 ENCOUNTER — Ambulatory Visit: Payer: 59 | Admitting: Cardiology

## 2019-07-23 ENCOUNTER — Other Ambulatory Visit: Payer: Self-pay

## 2019-07-23 VITALS — BP 132/57 | HR 54 | Resp 17 | Ht 65.0 in | Wt 201.0 lb

## 2019-07-23 DIAGNOSIS — I480 Paroxysmal atrial fibrillation: Secondary | ICD-10-CM

## 2019-07-23 DIAGNOSIS — M79604 Pain in right leg: Secondary | ICD-10-CM | POA: Insufficient documentation

## 2019-07-23 DIAGNOSIS — M79605 Pain in left leg: Secondary | ICD-10-CM | POA: Insufficient documentation

## 2019-07-23 DIAGNOSIS — Z952 Presence of prosthetic heart valve: Secondary | ICD-10-CM

## 2019-07-23 DIAGNOSIS — I10 Essential (primary) hypertension: Secondary | ICD-10-CM

## 2019-07-23 DIAGNOSIS — I25708 Atherosclerosis of coronary artery bypass graft(s), unspecified, with other forms of angina pectoris: Secondary | ICD-10-CM

## 2019-07-23 HISTORY — DX: Pain in right leg: M79.604

## 2019-07-27 ENCOUNTER — Other Ambulatory Visit: Payer: Self-pay

## 2019-07-27 ENCOUNTER — Ambulatory Visit: Payer: 59 | Admitting: Pharmacist

## 2019-07-27 DIAGNOSIS — Z5181 Encounter for therapeutic drug level monitoring: Secondary | ICD-10-CM | POA: Insufficient documentation

## 2019-07-27 DIAGNOSIS — Z952 Presence of prosthetic heart valve: Secondary | ICD-10-CM

## 2019-07-27 DIAGNOSIS — Z7901 Long term (current) use of anticoagulants: Secondary | ICD-10-CM

## 2019-07-27 LAB — POCT INR: INR: 2.6 (ref 2.0–3.0)

## 2019-07-27 NOTE — Progress Notes (Signed)
Anticoagulation Management Kimberly Hobbs is a 58 y.o. female who reports to the clinic for monitoring of warfarin treatment.    Indication: Hx of Mechanical mitral and aortic valve replacement   Duration: indefinite Supervising physician: [facility] Visit History:  Patient does not report signs/symptoms of bleeding or thromboembolism   Other recent changes: No changes in diet, medications, lifestyle. Pt has made significant changes in her diet and lifestyle since last check. Has cut of sugar and controlled carb intake. Lost 10 lbs over the last 1-2 months. Has been eating a cup of iceberg lettuce a day with tomatoes and ranch. Started exercising 30-60 mins a day consisting of walking on her treadmill, inclined steps, or planks. Pt maintaining steady Vitk intake of spinach 2-3x/week and avocado toast 1x/week.  Recent complains of leg pain. OV w/ Dr. Virgina Jock on 6/4. PCP f/u scheduled for today. No recent intake of NSAIDs. Pt managing pain with PRN tylenol. Pt planing on leaving for a mountain trip on July 9th.   Anticoagulation Episode Summary    Current INR goal:  2.5-3.5  TTR:  61.3 % (1.3 y)  Next INR check:  08/24/2019  INR from last check:  2.6 (07/27/2019)  Weekly max warfarin dose:    Target end date:  Indefinite  INR check location:    Preferred lab:    Send INR reminders to:     Indications   H/O mitral valve replacement with mechanical valve [Z95.2] Long term (current) use of anticoagulants [Z79.01] Encounter for therapeutic drug monitoring [Z51.81]       Comments:          Allergies  Allergen Reactions  . Iodinated Diagnostic Agents Other (See Comments)    Patient is unsure of reaction type  . Penicillins Other (See Comments)    Immune to drug , does not work per patient  Did it involve swelling of the face/tongue/throat, SOB, or low BP? No Did it involve sudden or severe rash/hives, skin peeling, or any reaction on the inside of  your mouth or nose? No Did you need to seek medical attention at a hospital or doctor's office? No When did it last happen?NOT A TRUE ALLERGY If all above answers are "NO", may proceed with cephalosporin use.     Current Outpatient Medications:  .  acetaminophen (TYLENOL) 500 MG tablet, Take 500 mg by mouth every 6 (six) hours as needed (for pain.)., Disp: , Rfl:  .  albuterol (VENTOLIN HFA) 108 (90 Base) MCG/ACT inhaler, Inhale 2 puffs into the lungs every 6 (six) hours as needed for wheezing or shortness of breath. , Disp: , Rfl:  .  aspirin EC 81 MG tablet, Take 81 mg by mouth daily. , Disp: , Rfl:  .  busPIRone (BUSPAR) 5 MG tablet, Take 5 mg by mouth daily. , Disp: , Rfl:  .  citalopram (CELEXA) 40 MG tablet, Take 20 mg by mouth 2 (two) times a day., Disp: , Rfl:  .  clonazePAM (KLONOPIN) 0.5 MG tablet, Take 0.5 mg by mouth at bedtime. , Disp: , Rfl:  .  diltiazem (CARDIZEM CD) 180 MG 24 hr capsule, Take 1 capsule (180 mg total) by mouth daily., Disp: 90 capsule, Rfl: 2 .  furosemide (LASIX) 20 MG tablet, TAKE 1 TABLET BY MOUTH DAILY, Disp: 90 tablet, Rfl: 1 .  isosorbide mononitrate (IMDUR) 60 MG 24 hr tablet, TAKE 1 TABLET BY MOUTH DAILY, Disp: 90 tablet, Rfl: 3 .  Lancets (Leslie  [id number]) MISC, TAKE AS DIRECTED ONCE DAILY., Disp: , Rfl:  .  levalbuterol (XOPENEX HFA) 45 MCG/ACT inhaler, Inhale 1-2 puffs into the lungs every 6 (six) hours as needed for shortness of breath., Disp: 1 Inhaler, Rfl: 12 .  losartan (COZAAR) 50 MG tablet, TAKE 1 TABLET BY MOUTH DAILY, Disp: 30 tablet, Rfl: 0 .  metFORMIN (GLUCOPHAGE) 500 MG tablet, Take 500 mg by mouth 2 (two) times daily., Disp: , Rfl:  .  metoprolol tartrate (LOPRESSOR) 100 MG tablet, Take 1 tablet (100 mg total) by mouth 2 (two) times daily., Disp: 180 tablet, Rfl: 3 .  nitroGLYCERIN (NITROSTAT) 0.4 MG SL tablet, Place 0.4 mg under the tongue every 5 (five) minutes x 3 doses as needed for chest pain. , Disp: , Rfl:  .   ONETOUCH VERIO test strip, AS DIRECTED ONCE A DAY, Disp: , Rfl:  .  pantoprazole (PROTONIX) 40 MG tablet, TAKE 1 TABLET(40 MG) BY MOUTH DAILY, Disp: 90 tablet, Rfl: 1 .  rosuvastatin (CRESTOR) 40 MG tablet, TAKE 1 TABLET BY MOUTH DAILY, Disp: 90 tablet, Rfl: 1 .  spironolactone (ALDACTONE) 50 MG tablet, TAKE 1 TABLET(50 MG) BY MOUTH DAILY, Disp: 30 tablet, Rfl: 3 .  Tiotropium Bromide-Olodaterol (STIOLTO RESPIMAT) 2.5-2.5 MCG/ACT AERS, Inhale 2 puffs into the lungs daily., Disp: 4 g, Rfl: 11 .  warfarin (COUMADIN) 5 MG tablet, Take 5 mg by mouth daily. Take 1 tablet by mouth every Tues. Refer to most recent anticoagulation note for most accurate information., Disp: , Rfl:  .  warfarin (COUMADIN) 7.5 MG tablet, TAKE 1 TABLET BY MOUTH EVERY EVENING, Disp: 60 tablet, Rfl: 1 Past Medical History:  Diagnosis Date  . COVID-19   . Hyperlipidemia   . Hypertension   . Rheumatic heart disease    MS/ AS    ASSESSMENT  Recent Results: The most recent result is correlated with 57.5 mg per week:  Lab Results  Component Value Date   INR 2.6 07/27/2019   INR 3.2 (A) 07/02/2019   INR 2.5 06/15/2019    Anticoagulation Dosing: Description   INR at goal. Continue current dose of 12.5 mg on Tues and 7.5 mg all other days. Recheck in 4 weeks     INR today: Therapeutic. Pt continues to remain therapeutic. Reports to be able to maintain the Vit K intake as previously discussed. Pt aware to continue steady diet intake.   PLAN Weekly dose was unchanged. Continue taking 12.5 mg every Tues and 7.5 mg all other days. Recheck in 4 weeks   Patient Instructions  INR at goal. Continue current dose of 12.5 mg on Tues and 7.5 mg all other days. Recheck in 4 weeks  Patient advised to contact clinic or seek medical attention if signs/symptoms of bleeding or thromboembolism occur.  Patient verbalized understanding by repeating back information and was advised to contact me if further medication-related  questions arise.   Follow-up Return in about 4 weeks (around 08/24/2019).  [name], PharmD  15 minutes spent face-to-face with the patient during the encounter. 50% of time spent on education, including signs/sx bleeding and clotting, as well as food and drug interactions with warfarin. 50% of time was spent on fingerprick POC INR sample collection,processing, results determination, and documentation

## 2019-07-27 NOTE — Patient Instructions (Signed)
INR at goal. Continue current dose of 12.5 mg on Tues and 7.5 mg all other days. Recheck in 4 weeks

## 2019-08-01 ENCOUNTER — Other Ambulatory Visit: Payer: Self-pay | Admitting: Cardiology

## 2019-08-13 ENCOUNTER — Ambulatory Visit: Payer: 59 | Admitting: Cardiology

## 2019-08-15 ENCOUNTER — Other Ambulatory Visit: Payer: Self-pay | Admitting: Cardiology

## 2019-08-24 ENCOUNTER — Ambulatory Visit: Payer: 59 | Admitting: Pharmacist

## 2019-08-24 ENCOUNTER — Other Ambulatory Visit: Payer: Self-pay

## 2019-08-24 DIAGNOSIS — Z7901 Long term (current) use of anticoagulants: Secondary | ICD-10-CM

## 2019-08-24 DIAGNOSIS — Z5181 Encounter for therapeutic drug level monitoring: Secondary | ICD-10-CM

## 2019-08-24 DIAGNOSIS — Z952 Presence of prosthetic heart valve: Secondary | ICD-10-CM

## 2019-08-24 LAB — POCT INR: INR: 2.4 (ref 2.0–3.0)

## 2019-08-24 NOTE — Patient Instructions (Signed)
INR below goal. Increase weekly dose to 12.5 mg on Tues and Thus and 7.5 mg all other days. Recheck in 2 weeks

## 2019-08-24 NOTE — Progress Notes (Signed)
Anticoagulation Management Kimberly Hobbs is a 59 y.o. female who reports to the clinic for monitoring of warfarin treatment.    Indication: Hx of Mechanical mitral and aortic valve replacement   Duration: indefinite Supervising physician: [facility] Visit History:  Patient does not report signs/symptoms of bleeding or thromboembolism   Other recent changes: No changes in diet, medications, lifestyle. Pt reports that she had 1 glass of margarita last weekend for her birthday. Pt also reports to having less salad over the past week.  Pt was also started on ropinirole for her leg pain by PCP last month. Pt reports that she didn't like the CNS side effects and stopped it after two doses. Leg pain slightly improved since last month, but still persistent. Pt currently not taking anything to manage the pain.   Continues to exercising 30-60 mins a day consisting of walking on her treadmill, inclined steps, or planks. Pt maintaining steady Vitk intake of spinach 2-3x/week and avocado toast 1x/week.  Anticoagulation Episode Summary    Current INR goal:  2.5-3.5  TTR:  60.7 % (1.4 y)  Next INR check:  09/07/2019  INR from last check:  2.4 (08/24/2019)  Weekly max warfarin dose:    Target end date:  Indefinite  INR check location:    Preferred lab:    Send INR reminders to:     Indications   H/O mitral valve replacement with mechanical valve [Z95.2] Monitoring for long-term anticoagulant use [Z51.81 Z79.01]       Comments:          Allergies  Allergen Reactions  . Iodinated Diagnostic Agents Other (See Comments)    Patient is unsure of reaction type  . Penicillins Other (See Comments)    Immune to drug , does not work per patient  Did it involve swelling of the face/tongue/throat, SOB, or low BP? No Did it involve sudden or severe rash/hives, skin peeling, or any reaction on the inside of your mouth or nose? No Did you need to seek medical attention at a  hospital or doctor's office? No When did it last happen?NOT A TRUE ALLERGY If all above answers are "NO", may proceed with cephalosporin use.     Current Outpatient Medications:  .  acetaminophen (TYLENOL) 500 MG tablet, Take 500 mg by mouth every 6 (six) hours as needed (for pain.)., Disp: , Rfl:  .  albuterol (VENTOLIN HFA) 108 (90 Base) MCG/ACT inhaler, Inhale 2 puffs into the lungs every 6 (six) hours as needed for wheezing or shortness of breath. , Disp: , Rfl:  .  aspirin EC 81 MG tablet, Take 81 mg by mouth daily. , Disp: , Rfl:  .  busPIRone (BUSPAR) 5 MG tablet, Take 5 mg by mouth daily. , Disp: , Rfl:  .  citalopram (CELEXA) 40 MG tablet, Take 20 mg by mouth 2 (two) times a day., Disp: , Rfl:  .  clonazePAM (KLONOPIN) 0.5 MG tablet, Take 0.5 mg by mouth at bedtime. , Disp: , Rfl:  .  diltiazem (CARDIZEM CD) 180 MG 24 hr capsule, Take 1 capsule (180 mg total) by mouth daily., Disp: 90 capsule, Rfl: 2 .  furosemide (LASIX) 20 MG tablet, TAKE 1 TABLET BY MOUTH DAILY, Disp: 90 tablet, Rfl: 1 .  isosorbide mononitrate (IMDUR) 60 MG 24 hr tablet, TAKE 1 TABLET BY MOUTH DAILY, Disp: 90 tablet, Rfl: 3 .  Lancets (ONETOUCH DELICA PLUS [id number]) MISC, TAKE AS DIRECTED ONCE DAILY., Disp: , Rfl:  .  levalbuterol (XOPENEX HFA) 45 MCG/ACT inhaler, Inhale 1-2 puffs into the lungs every 6 (six) hours as needed for shortness of breath., Disp: 1 Inhaler, Rfl: 12 .  losartan (COZAAR) 50 MG tablet, TAKE 1 TABLET BY MOUTH DAILY, Disp: 30 tablet, Rfl: 0 .  metFORMIN (GLUCOPHAGE) 500 MG tablet, Take 500 mg by mouth 2 (two) times daily., Disp: , Rfl:  .  metoprolol tartrate (LOPRESSOR) 100 MG tablet, Take 1 tablet (100 mg total) by mouth 2 (two) times daily., Disp: 180 tablet, Rfl: 3 .  nitroGLYCERIN (NITROSTAT) 0.4 MG SL tablet, Place 0.4 mg under the tongue every 5 (five) minutes x 3 doses as needed for chest pain. , Disp: , Rfl:  .  ONETOUCH VERIO test strip, AS DIRECTED ONCE A DAY, Disp: , Rfl:  .   pantoprazole (PROTONIX) 40 MG tablet, TAKE 1 TABLET(40 MG) BY MOUTH DAILY, Disp: 90 tablet, Rfl: 1 .  rosuvastatin (CRESTOR) 40 MG tablet, TAKE 1 TABLET BY MOUTH DAILY, Disp: 90 tablet, Rfl: 1 .  spironolactone (ALDACTONE) 50 MG tablet, TAKE 1 TABLET(50 MG) BY MOUTH DAILY, Disp: 30 tablet, Rfl: 3 .  Tiotropium Bromide-Olodaterol (STIOLTO RESPIMAT) 2.5-2.5 MCG/ACT AERS, Inhale 2 puffs into the lungs daily., Disp: 4 g, Rfl: 11 .  warfarin (COUMADIN) 5 MG tablet, Take 5 mg by mouth daily. Take 1 tablet by mouth every Tues. Refer to most recent anticoagulation note for most accurate information., Disp: , Rfl:  .  warfarin (COUMADIN) 7.5 MG tablet, TAKE 1 TABLET BY MOUTH EVERY EVENING, Disp: 60 tablet, Rfl: 1 Past Medical History:  Diagnosis Date  . COVID-19   . Hyperlipidemia   . Hypertension   . Rheumatic heart disease    MS/ AS    ASSESSMENT  Recent Results: The most recent result is correlated with 57.5 mg per week:  Lab Results  Component Value Date   INR 2.4 08/24/2019   INR 2.6 07/27/2019   INR 3.2 (A) 07/02/2019    Anticoagulation Dosing: Description   INR below goal. Increase weekly dose to 12.5 mg on Tues and Thus and 7.5 mg all other days. Recheck in 2 weeks     INR today: Subtherapeutic. INR trending down over the past three INR checks. Recent decreased VitK intake, alcohol intake should may have artificially increased INR, but still below goal. Denies any missed doses, increased high VitK intake, relevant medication changes. Will increase weekly dose and continue close monitoring to ensure pt's INR remains therapeutic.   PLAN Weekly dose was increased by 8.7%. Increase weekly dose to 12.5 mg every Tues and Thus, and 7.5 mg all other days. Recheck in 2 weeks   Patient Instructions  INR below goal. Increase weekly dose to 12.5 mg on Tues and Thus and 7.5 mg all other days. Recheck in 2 weeks  Patient advised to contact clinic or seek medical attention if signs/symptoms  of bleeding or thromboembolism occur.  Patient verbalized understanding by repeating back information and was advised to contact me if further medication-related questions arise.   Follow-up Return in about 2 weeks (around 09/07/2019).  Kimberly Hobbs Penna, PharmD  15 minutes spent face-to-face with the patient during the encounter. 50% of time spent on education, including signs/sx bleeding and clotting, as well as food and drug interactions with warfarin. 50% of time was spent on fingerprick POC INR sample collection,processing, results determination, and documentation

## 2019-08-31 ENCOUNTER — Other Ambulatory Visit: Payer: Self-pay | Admitting: Cardiology

## 2019-09-08 ENCOUNTER — Other Ambulatory Visit: Payer: Self-pay

## 2019-09-08 ENCOUNTER — Ambulatory Visit: Payer: 59 | Admitting: Pharmacist

## 2019-09-08 DIAGNOSIS — Z7901 Long term (current) use of anticoagulants: Secondary | ICD-10-CM

## 2019-09-08 DIAGNOSIS — Z5181 Encounter for therapeutic drug level monitoring: Secondary | ICD-10-CM

## 2019-09-08 DIAGNOSIS — Z952 Presence of prosthetic heart valve: Secondary | ICD-10-CM

## 2019-09-08 LAB — POCT INR: INR: 2.7 (ref 2.0–3.0)

## 2019-09-08 NOTE — Patient Instructions (Signed)
INR at goal. Continue current weekly dose to 12.5 mg on Tues and Thus and 7.5 mg all other days. Recheck INR in 4 weeks 

## 2019-09-08 NOTE — Progress Notes (Signed)
Anticoagulation Management Kimberly Hobbs is a 59 y.o. female who reports to the clinic for monitoring of warfarin treatment.    Indication: Hx of Mechanical mitral and aortic valve replacement   Duration: indefinite Supervising physician: [facility] Visit History:  Patient does not report signs/symptoms of bleeding or thromboembolism   Other recent changes: No changes in diet, medications, lifestyle.   Continues to exercising 30-60 mins a day consisting of walking on her treadmill, inclined steps, or planks. Pt maintaining steady Vitk intake of spinach 2-3x/week and avocado toast 1x/week.  Anticoagulation Episode Summary    Current INR goal:  2.5-3.5  TTR:  60.9 % (1.4 y)  Next INR check:  10/05/2019  INR from last check:  2.7 (09/08/2019)  Weekly max warfarin dose:    Target end date:  Indefinite  INR check location:    Preferred lab:    Send INR reminders to:     Indications   H/O mitral valve replacement with mechanical valve [Z95.2] Monitoring for long-term anticoagulant use [Z51.81 Z79.01]       Comments:          Allergies  Allergen Reactions  . Iodinated Diagnostic Agents Other (See Comments)    Patient is unsure of reaction type  . Penicillins Other (See Comments)    Immune to drug , does not work per patient  Did it involve swelling of the face/tongue/throat, SOB, or low BP? No Did it involve sudden or severe rash/hives, skin peeling, or any reaction on the inside of your mouth or nose? No Did you need to seek medical attention at a hospital or doctor's office? No When did it last happen?NOT A TRUE ALLERGY If all above answers are "NO", may proceed with cephalosporin use.     Current Outpatient Medications:  .  acetaminophen (TYLENOL) 500 MG tablet, Take 500 mg by mouth every 6 (six) hours as needed (for pain.)., Disp: , Rfl:  .  albuterol (VENTOLIN HFA) 108 (90 Base) MCG/ACT inhaler, Inhale 2 puffs into the lungs every 6  (six) hours as needed for wheezing or shortness of breath. , Disp: , Rfl:  .  aspirin EC 81 MG tablet, Take 81 mg by mouth daily. , Disp: , Rfl:  .  busPIRone (BUSPAR) 5 MG tablet, Take 5 mg by mouth daily. , Disp: , Rfl:  .  citalopram (CELEXA) 40 MG tablet, Take 20 mg by mouth 2 (two) times a day., Disp: , Rfl:  .  clonazePAM (KLONOPIN) 0.5 MG tablet, Take 0.5 mg by mouth at bedtime. , Disp: , Rfl:  .  diltiazem (CARDIZEM CD) 180 MG 24 hr capsule, Take 1 capsule (180 mg total) by mouth daily., Disp: 90 capsule, Rfl: 2 .  furosemide (LASIX) 20 MG tablet, TAKE 1 TABLET BY MOUTH DAILY, Disp: 90 tablet, Rfl: 1 .  isosorbide mononitrate (IMDUR) 60 MG 24 hr tablet, TAKE 1 TABLET BY MOUTH DAILY, Disp: 90 tablet, Rfl: 3 .  Lancets (ONETOUCH DELICA PLUS [id number]) MISC, TAKE AS DIRECTED ONCE DAILY., Disp: , Rfl:  .  levalbuterol (XOPENEX HFA) 45 MCG/ACT inhaler, Inhale 1-2 puffs into the lungs every 6 (six) hours as needed for shortness of breath., Disp: 1 Inhaler, Rfl: 12 .  losartan (COZAAR) 50 MG tablet, TAKE 1 TABLET BY MOUTH DAILY, Disp: 30 tablet, Rfl: 6 .  metFORMIN (GLUCOPHAGE) 500 MG tablet, Take 500 mg by mouth 2 (two) times daily., Disp: , Rfl:  .  metoprolol tartrate (LOPRESSOR) 100 MG tablet,  Take 1 tablet (100 mg total) by mouth 2 (two) times daily., Disp: 180 tablet, Rfl: 3 .  nitroGLYCERIN (NITROSTAT) 0.4 MG SL tablet, Place 0.4 mg under the tongue every 5 (five) minutes x 3 doses as needed for chest pain. , Disp: , Rfl:  .  ONETOUCH VERIO test strip, AS DIRECTED ONCE A DAY, Disp: , Rfl:  .  pantoprazole (PROTONIX) 40 MG tablet, TAKE 1 TABLET(40 MG) BY MOUTH DAILY, Disp: 90 tablet, Rfl: 1 .  rosuvastatin (CRESTOR) 40 MG tablet, TAKE 1 TABLET BY MOUTH DAILY, Disp: 90 tablet, Rfl: 1 .  spironolactone (ALDACTONE) 50 MG tablet, TAKE 1 TABLET(50 MG) BY MOUTH DAILY, Disp: 30 tablet, Rfl: 6 .  Tiotropium Bromide-Olodaterol (STIOLTO RESPIMAT) 2.5-2.5 MCG/ACT AERS, Inhale 2 puffs into the lungs  daily., Disp: 4 g, Rfl: 11 .  warfarin (COUMADIN) 5 MG tablet, Take 5 mg by mouth daily. Take 1 tablet by mouth every Tues. Refer to most recent anticoagulation note for most accurate information., Disp: , Rfl:  .  warfarin (COUMADIN) 7.5 MG tablet, TAKE 1 TABLET BY MOUTH EVERY EVENING, Disp: 60 tablet, Rfl: 1 Past Medical History:  Diagnosis Date  . COVID-19   . Hyperlipidemia   . Hypertension   . Rheumatic heart disease    MS/ AS    ASSESSMENT  Recent Results: The most recent result is correlated with 62.5 mg per week:  Lab Results  Component Value Date   INR 2.7 09/08/2019   INR 2.4 08/24/2019   INR 2.6 07/27/2019    Anticoagulation Dosing: Description   INR at goal. Continue current weekly dose to 12.5 mg on Tues and Thus and 7.5 mg all other days. Recheck INR in 4 weeks     INR today: Therapeutic following dose increase last week. Pt reports that she is tolerating the new dose well and denies any bleeding or bruising symptoms. No relevant changes in diet, medications, or lifestyle.  PLAN Weekly dose was unchanged. Continue current weekly dose of 12.5 mg every Tues and Thus, and 7.5 mg all other days. Recheck in 4 weeks   Patient Instructions  INR at goal. Continue current weekly dose to 12.5 mg on Tues and Thus and 7.5 mg all other days. Recheck INR in 4 weeks  Patient advised to contact clinic or seek medical attention if signs/symptoms of bleeding or thromboembolism occur.  Patient verbalized understanding by repeating back information and was advised to contact me if further medication-related questions arise.   Follow-up Return in about 4 weeks (around 10/06/2019).  Kimberly Hobbs, PharmD  15 minutes spent face-to-face with the patient during the encounter. 50% of time spent on education, including signs/sx bleeding and clotting, as well as food and drug interactions with warfarin. 50% of time was spent on fingerprick POC INR sample collection,processing, results  determination, and documentation

## 2019-09-20 ENCOUNTER — Other Ambulatory Visit: Payer: Self-pay | Admitting: Pharmacist

## 2019-09-20 DIAGNOSIS — Z5181 Encounter for therapeutic drug level monitoring: Secondary | ICD-10-CM

## 2019-09-20 DIAGNOSIS — I480 Paroxysmal atrial fibrillation: Secondary | ICD-10-CM

## 2019-09-20 DIAGNOSIS — Z952 Presence of prosthetic heart valve: Secondary | ICD-10-CM

## 2019-09-20 MED ORDER — WARFARIN SODIUM 5 MG PO TABS: 5.0000 mg | ORAL_TABLET | Freq: Every day | ORAL | 2 refills | Status: DC

## 2019-09-28 ENCOUNTER — Other Ambulatory Visit: Payer: Self-pay

## 2019-09-28 DIAGNOSIS — Z5181 Encounter for therapeutic drug level monitoring: Secondary | ICD-10-CM

## 2019-09-28 DIAGNOSIS — Z952 Presence of prosthetic heart valve: Secondary | ICD-10-CM

## 2019-09-28 DIAGNOSIS — I480 Paroxysmal atrial fibrillation: Secondary | ICD-10-CM

## 2019-09-28 DIAGNOSIS — Z7901 Long term (current) use of anticoagulants: Secondary | ICD-10-CM

## 2019-09-28 MED ORDER — WARFARIN SODIUM 7.5 MG PO TABS: ORAL_TABLET | ORAL | 1 refills | Status: DC

## 2019-09-28 MED ORDER — WARFARIN SODIUM 5 MG PO TABS: 5.0000 mg | ORAL_TABLET | Freq: Every day | ORAL | 2 refills | Status: DC

## 2019-09-30 ENCOUNTER — Other Ambulatory Visit: Payer: Self-pay | Admitting: Cardiology

## 2019-09-30 DIAGNOSIS — I25119 Atherosclerotic heart disease of native coronary artery with unspecified angina pectoris: Secondary | ICD-10-CM

## 2019-10-05 ENCOUNTER — Ambulatory Visit: Payer: 59 | Admitting: Pharmacist

## 2019-10-05 ENCOUNTER — Other Ambulatory Visit: Payer: Self-pay | Admitting: Pharmacist

## 2019-10-05 DIAGNOSIS — I480 Paroxysmal atrial fibrillation: Secondary | ICD-10-CM

## 2019-10-05 DIAGNOSIS — Z952 Presence of prosthetic heart valve: Secondary | ICD-10-CM

## 2019-10-05 DIAGNOSIS — Z5181 Encounter for therapeutic drug level monitoring: Secondary | ICD-10-CM

## 2019-10-05 DIAGNOSIS — Z7901 Long term (current) use of anticoagulants: Secondary | ICD-10-CM

## 2019-10-05 LAB — POCT INR: INR: 3.1 — AB (ref 2.0–3.0)

## 2019-10-05 MED ORDER — WARFARIN SODIUM 5 MG PO TABS: 5.0000 mg | ORAL_TABLET | Freq: Every day | ORAL | 2 refills | Status: DC

## 2019-10-05 NOTE — Patient Instructions (Signed)
INR at goal. Continue current weekly dose to 12.5 mg on Tues and Thus and 7.5 mg all other days. Recheck INR in 4 weeks

## 2019-10-05 NOTE — Progress Notes (Signed)
Anticoagulation Management Kimberly Hobbs is a 59 y.o. female who reports to the clinic for monitoring of warfarin treatment.    Indication: Hx of Mechanical mitral and aortic valve replacement   Duration: indefinite Supervising physician: [facility] Visit History:  Patient does not report signs/symptoms of bleeding or thromboembolism   Other recent changes: No changes in diet, medications, lifestyle.   Continues to exercising 30-60 mins a day consisting of walking on her treadmill, inclined steps, or planks. Pt maintaining steady Vitk intake of spinach 2-3x/week and avocado toast 1x/week.  Anticoagulation Episode Summary    Current INR goal:  2.5-3.5  TTR:  62.8 % (1.5 y)  Next INR check:  11/02/2019  INR from last check:  3.1 (10/05/2019)  Weekly max warfarin dose:    Target end date:  Indefinite  INR check location:    Preferred lab:    Send INR reminders to:     Indications   H/O mitral valve replacement with mechanical valve [Z95.2] Monitoring for long-term anticoagulant use [Z51.81 Z79.01]       Comments:          Allergies  Allergen Reactions  . Iodinated Diagnostic Agents Other (See Comments)    Patient is unsure of reaction type  . Penicillins Other (See Comments)    Immune to drug , does not work per patient  Did it involve swelling of the face/tongue/throat, SOB, or low BP? No Did it involve sudden or severe rash/hives, skin peeling, or any reaction on the inside of your mouth or nose? No Did you need to seek medical attention at a hospital or doctor's office? No When did it last happen?NOT A TRUE ALLERGY If all above answers are "NO", may proceed with cephalosporin use.     Current Outpatient Medications:  .  acetaminophen (TYLENOL) 500 MG tablet, Take 500 mg by mouth every 6 (six) hours as needed (for pain.)., Disp: , Rfl:  .  albuterol (VENTOLIN HFA) 108 (90 Base) MCG/ACT inhaler, Inhale 2 puffs into the lungs every 6  (six) hours as needed for wheezing or shortness of breath. , Disp: , Rfl:  .  aspirin EC 81 MG tablet, Take 81 mg by mouth daily. , Disp: , Rfl:  .  busPIRone (BUSPAR) 5 MG tablet, Take 5 mg by mouth daily. , Disp: , Rfl:  .  citalopram (CELEXA) 40 MG tablet, Take 20 mg by mouth 2 (two) times a day., Disp: , Rfl:  .  clonazePAM (KLONOPIN) 0.5 MG tablet, Take 0.5 mg by mouth at bedtime. , Disp: , Rfl:  .  diltiazem (CARDIZEM CD) 180 MG 24 hr capsule, Take 1 capsule (180 mg total) by mouth daily., Disp: 90 capsule, Rfl: 2 .  furosemide (LASIX) 20 MG tablet, TAKE 1 TABLET BY MOUTH DAILY, Disp: 90 tablet, Rfl: 2 .  isosorbide mononitrate (IMDUR) 60 MG 24 hr tablet, TAKE 1 TABLET BY MOUTH DAILY, Disp: 90 tablet, Rfl: 3 .  Lancets (ONETOUCH DELICA PLUS [id number]) MISC, TAKE AS DIRECTED ONCE DAILY., Disp: , Rfl:  .  levalbuterol (XOPENEX HFA) 45 MCG/ACT inhaler, Inhale 1-2 puffs into the lungs every 6 (six) hours as needed for shortness of breath., Disp: 1 Inhaler, Rfl: 12 .  losartan (COZAAR) 50 MG tablet, TAKE 1 TABLET BY MOUTH DAILY, Disp: 30 tablet, Rfl: 6 .  metFORMIN (GLUCOPHAGE) 500 MG tablet, Take 500 mg by mouth 2 (two) times daily., Disp: , Rfl:  .  metoprolol tartrate (LOPRESSOR) 100 MG tablet,  Take 1 tablet (100 mg total) by mouth 2 (two) times daily., Disp: 180 tablet, Rfl: 3 .  nitroGLYCERIN (NITROSTAT) 0.4 MG SL tablet, Place 0.4 mg under the tongue every 5 (five) minutes x 3 doses as needed for chest pain. , Disp: , Rfl:  .  ONETOUCH VERIO test strip, AS DIRECTED ONCE A DAY, Disp: , Rfl:  .  pantoprazole (PROTONIX) 40 MG tablet, TAKE 1 TABLET(40 MG) BY MOUTH DAILY, Disp: 90 tablet, Rfl: 1 .  rosuvastatin (CRESTOR) 40 MG tablet, TAKE 1 TABLET BY MOUTH DAILY, Disp: 90 tablet, Rfl: 1 .  spironolactone (ALDACTONE) 50 MG tablet, TAKE 1 TABLET(50 MG) BY MOUTH DAILY, Disp: 30 tablet, Rfl: 6 .  Tiotropium Bromide-Olodaterol (STIOLTO RESPIMAT) 2.5-2.5 MCG/ACT AERS, Inhale 2 puffs into the lungs  daily., Disp: 4 g, Rfl: 11 .  warfarin (COUMADIN) 5 MG tablet, Take 1 tablet (5 mg total) by mouth daily. Take 1 tablet by mouth daily. Refer to most recent anticoagulation note for most accurate information., Disp: 90 tablet, Rfl: 2 .  warfarin (COUMADIN) 7.5 MG tablet, TAKE 1 TABLET BY MOUTH EVERY EVENING, Disp: 90 tablet, Rfl: 1 Past Medical History:  Diagnosis Date  . COVID-19   . Hyperlipidemia   . Hypertension   . Rheumatic heart disease    MS/ AS    ASSESSMENT  Recent Results: The most recent result is correlated with 62.5 mg per week:  Lab Results  Component Value Date   INR 3.1 (A) 10/05/2019   INR 2.7 09/08/2019   INR 2.4 08/24/2019    Anticoagulation Dosing: Description   INR at goal. Continue current weekly dose to 12.5 mg on Tues and Thus and 7.5 mg all other days. Recheck INR in 4 weeks     INR today: Therapeutic following dose increase last week. Pt reports that she is tolerating the new dose well and denies any bleeding or bruising symptoms. No relevant changes in diet, medications, or lifestyle.  PLAN Weekly dose was unchanged. Continue current weekly dose of 12.5 mg every Tues and Thus, and 7.5 mg all other days. Recheck in 4 weeks   Patient Instructions  INR at goal. Continue current weekly dose to 12.5 mg on Tues and Thus and 7.5 mg all other days. Recheck INR in 4 weeks  Patient advised to contact clinic or seek medical attention if signs/symptoms of bleeding or thromboembolism occur.  Patient verbalized understanding by repeating back information and was advised to contact me if further medication-related questions arise.   Follow-up Return in about 4 weeks (around 11/02/2019).  [name], PharmD  15 minutes spent face-to-face with the patient during the encounter. 50% of time spent on education, including signs/sx bleeding and clotting, as well as food and drug interactions with warfarin. 50% of time was spent on fingerprick POC INR sample  collection,processing, results determination, and documentation

## 2019-10-22 ENCOUNTER — Other Ambulatory Visit: Payer: Self-pay | Admitting: Cardiology

## 2019-11-02 ENCOUNTER — Other Ambulatory Visit: Payer: Self-pay

## 2019-11-02 ENCOUNTER — Ambulatory Visit: Payer: 59 | Admitting: Pharmacist

## 2019-11-02 DIAGNOSIS — Z952 Presence of prosthetic heart valve: Secondary | ICD-10-CM

## 2019-11-02 DIAGNOSIS — Z5181 Encounter for therapeutic drug level monitoring: Secondary | ICD-10-CM

## 2019-11-02 DIAGNOSIS — Z7901 Long term (current) use of anticoagulants: Secondary | ICD-10-CM

## 2019-11-02 LAB — POCT INR: INR: 2.7 (ref 2.0–3.0)

## 2019-11-02 NOTE — Patient Instructions (Signed)
INR at goal. Continue current weekly dose to 12.5 mg on Tues and Thus and 7.5 mg all other days. Recheck INR in 4 weeks

## 2019-11-02 NOTE — Progress Notes (Signed)
Anticoagulation Management Kimberly Hobbs is a 59 y.o. female who reports to the clinic for monitoring of warfarin treatment.    Indication: Hx of Mechanical mitral and aortic valve replacement   Duration: indefinite Supervising physician: [facility] Visit History:  Patient does not report signs/symptoms of bleeding or thromboembolism   Other recent changes: No changes in diet, medications, lifestyle.   Continues to exercising 30-60 mins a day consisting of walking on her treadmill, inclined steps, or planks. Pt maintaining steady Vitk intake of spinach 2-3x/week and avocado toast 1x/week.  Anticoagulation Episode Summary    Current INR goal:  2.5-3.5  TTR:  64.6 % (1.6 y)  Next INR check:  11/30/2019  INR from last check:  2.7 (11/02/2019)  Weekly max warfarin dose:    Target end date:  Indefinite  INR check location:    Preferred lab:    Send INR reminders to:     Indications   H/O mitral valve replacement with mechanical valve [Z95.2] Monitoring for long-term anticoagulant use [Z51.81 Z79.01]       Comments:          Allergies  Allergen Reactions  . Iodinated Diagnostic Agents Other (See Comments)    Patient is unsure of reaction type  . Penicillins Other (See Comments)    Immune to drug , does not work per patient  Did it involve swelling of the face/tongue/throat, SOB, or low BP? No Did it involve sudden or severe rash/hives, skin peeling, or any reaction on the inside of your mouth or nose? No Did you need to seek medical attention at a hospital or doctor's office? No When did it last happen?NOT A TRUE ALLERGY If all above answers are "NO", may proceed with cephalosporin use.     Current Outpatient Medications:  .  acetaminophen (TYLENOL) 500 MG tablet, Take 500 mg by mouth every 6 (six) hours as needed (for pain.)., Disp: , Rfl:  .  albuterol (VENTOLIN HFA) 108 (90 Base) MCG/ACT inhaler, Inhale 2 puffs into the lungs every 6  (six) hours as needed for wheezing or shortness of breath. , Disp: , Rfl:  .  aspirin EC 81 MG tablet, Take 81 mg by mouth daily. , Disp: , Rfl:  .  busPIRone (BUSPAR) 5 MG tablet, Take 5 mg by mouth daily. , Disp: , Rfl:  .  citalopram (CELEXA) 40 MG tablet, Take 20 mg by mouth 2 (two) times a day., Disp: , Rfl:  .  clonazePAM (KLONOPIN) 0.5 MG tablet, Take 0.5 mg by mouth at bedtime. , Disp: , Rfl:  .  diltiazem (CARDIZEM CD) 180 MG 24 hr capsule, Take 1 capsule (180 mg total) by mouth daily., Disp: 90 capsule, Rfl: 2 .  furosemide (LASIX) 20 MG tablet, TAKE 1 TABLET BY MOUTH DAILY, Disp: 90 tablet, Rfl: 2 .  isosorbide mononitrate (IMDUR) 60 MG 24 hr tablet, TAKE 1 TABLET BY MOUTH DAILY, Disp: 90 tablet, Rfl: 3 .  Lancets (ONETOUCH DELICA PLUS [id number]) MISC, TAKE AS DIRECTED ONCE DAILY., Disp: , Rfl:  .  levalbuterol (XOPENEX HFA) 45 MCG/ACT inhaler, Inhale 1-2 puffs into the lungs every 6 (six) hours as needed for shortness of breath., Disp: 1 Inhaler, Rfl: 12 .  losartan (COZAAR) 50 MG tablet, TAKE 1 TABLET BY MOUTH DAILY, Disp: 30 tablet, Rfl: 6 .  metFORMIN (GLUCOPHAGE) 500 MG tablet, Take 500 mg by mouth 2 (two) times daily., Disp: , Rfl:  .  metoprolol tartrate (LOPRESSOR) 100 MG tablet,  Take 1 tablet (100 mg total) by mouth 2 (two) times daily., Disp: 180 tablet, Rfl: 3 .  nitroGLYCERIN (NITROSTAT) 0.4 MG SL tablet, Place 0.4 mg under the tongue every 5 (five) minutes x 3 doses as needed for chest pain. , Disp: , Rfl:  .  ONETOUCH VERIO test strip, AS DIRECTED ONCE A DAY, Disp: , Rfl:  .  pantoprazole (PROTONIX) 40 MG tablet, TAKE 1 TABLET(40 MG) BY MOUTH DAILY, Disp: 90 tablet, Rfl: 1 .  rosuvastatin (CRESTOR) 40 MG tablet, TAKE 1 TABLET BY MOUTH DAILY, Disp: 90 tablet, Rfl: 1 .  spironolactone (ALDACTONE) 50 MG tablet, TAKE 1 TABLET(50 MG) BY MOUTH DAILY, Disp: 30 tablet, Rfl: 6 .  Tiotropium Bromide-Olodaterol (STIOLTO RESPIMAT) 2.5-2.5 MCG/ACT AERS, Inhale 2 puffs into the lungs  daily., Disp: 4 g, Rfl: 11 .  warfarin (COUMADIN) 5 MG tablet, Take 1 tablet (5 mg total) by mouth daily. Refer to most recent anticoagulation note for most accurate information., Disp: 90 tablet, Rfl: 2 .  warfarin (COUMADIN) 7.5 MG tablet, TAKE 1 TABLET BY MOUTH EVERY EVENING, Disp: 90 tablet, Rfl: 1 Past Medical History:  Diagnosis Date  . COVID-19   . Hyperlipidemia   . Hypertension   . Rheumatic heart disease    MS/ AS    ASSESSMENT  Recent Results: The most recent result is correlated with 62.5 mg per week:  Lab Results  Component Value Date   INR 2.7 11/02/2019   INR 3.1 (A) 10/05/2019   INR 2.7 09/08/2019    Anticoagulation Dosing: Description   INR at goal. Continue current weekly dose to 12.5 mg on Tues and Thus and 7.5 mg all other days. Recheck INR in 4 weeks     INR today: Therapeutic following dose increase last week. Pt reports that she is tolerating the new dose well and denies any bleeding or bruising symptoms. No relevant changes in diet, medications, or lifestyle.  PLAN Weekly dose was unchanged. Continue current weekly dose of 12.5 mg every Tues and Thus, and 7.5 mg all other days. Recheck in 4 weeks   Patient Instructions  INR at goal. Continue current weekly dose to 12.5 mg on Tues and Thus and 7.5 mg all other days. Recheck INR in 4 weeks  Patient advised to contact clinic or seek medical attention if signs/symptoms of bleeding or thromboembolism occur.  Patient verbalized understanding by repeating back information and was advised to contact me if further medication-related questions arise.   Follow-up Return in about 4 weeks (around 11/30/2019).  [name], PharmD  15 minutes spent face-to-face with the patient during the encounter. 50% of time spent on education, including signs/sx bleeding and clotting, as well as food and drug interactions with warfarin. 50% of time was spent on fingerprick POC INR sample collection,processing, results  determination, and documentation

## 2019-12-03 ENCOUNTER — Other Ambulatory Visit: Payer: Self-pay | Admitting: Cardiology

## 2019-12-03 ENCOUNTER — Ambulatory Visit: Payer: 59 | Admitting: Pharmacist

## 2019-12-03 ENCOUNTER — Other Ambulatory Visit: Payer: Self-pay

## 2019-12-03 DIAGNOSIS — Z952 Presence of prosthetic heart valve: Secondary | ICD-10-CM

## 2019-12-03 DIAGNOSIS — Z5181 Encounter for therapeutic drug level monitoring: Secondary | ICD-10-CM

## 2019-12-03 DIAGNOSIS — Z7901 Long term (current) use of anticoagulants: Secondary | ICD-10-CM

## 2019-12-03 LAB — POCT INR: INR: 3 (ref 2.0–3.0)

## 2019-12-03 NOTE — Progress Notes (Signed)
Anticoagulation Management Kimberly Hobbs is a 59 y.o. female who reports to the clinic for monitoring of warfarin treatment.    Indication: Hx of Mechanical mitral and aortic valve replacement   Duration: indefinite Supervising physician: [facility] Visit History:  Patient does not report signs/symptoms of bleeding or thromboembolism   Other recent changes: No changes in diet, medications, lifestyle.   Continues to exercising 30-60 mins a day consisting of walking on her treadmill, inclined steps, or planks. Pt maintaining steady Vitk intake of spinach 2-3x/week and avocado toast 1x/week.  Anticoagulation Episode Summary    Current INR goal:  2.5-3.5  TTR:  66.4 % (1.7 y)  Next INR check:  12/31/2019  INR from last check:  3.0 (12/03/2019)  Weekly max warfarin dose:    Target end date:  Indefinite  INR check location:    Preferred lab:    Send INR reminders to:     Indications   H/O mitral valve replacement with mechanical valve [Z95.2] Monitoring for long-term anticoagulant use [Z51.81 Z79.01]       Comments:          Allergies  Allergen Reactions  . Iodinated Diagnostic Agents Other (See Comments)    Patient is unsure of reaction type  . Penicillins Other (See Comments)    Immune to drug , does not work per patient  Did it involve swelling of the face/tongue/throat, SOB, or low BP? No Did it involve sudden or severe rash/hives, skin peeling, or any reaction on the inside of your mouth or nose? No Did you need to seek medical attention at a hospital or doctor's office? No When did it last happen?NOT A TRUE ALLERGY If all above answers are "NO", may proceed with cephalosporin use.     Current Outpatient Medications:  .  acetaminophen (TYLENOL) 500 MG tablet, Take 500 mg by mouth every 6 (six) hours as needed (for pain.)., Disp: , Rfl:  .  albuterol (VENTOLIN HFA) 108 (90 Base) MCG/ACT inhaler, Inhale 2 puffs into the lungs every  6 (six) hours as needed for wheezing or shortness of breath. , Disp: , Rfl:  .  aspirin EC 81 MG tablet, Take 81 mg by mouth daily. , Disp: , Rfl:  .  busPIRone (BUSPAR) 5 MG tablet, Take 5 mg by mouth daily. , Disp: , Rfl:  .  citalopram (CELEXA) 40 MG tablet, Take 20 mg by mouth 2 (two) times a day., Disp: , Rfl:  .  clonazePAM (KLONOPIN) 0.5 MG tablet, Take 0.5 mg by mouth at bedtime. , Disp: , Rfl:  .  diltiazem (CARDIZEM CD) 180 MG 24 hr capsule, TAKE 1 CAPSULE(180 MG) BY MOUTH DAILY, Disp: 90 capsule, Rfl: 2 .  furosemide (LASIX) 20 MG tablet, TAKE 1 TABLET BY MOUTH DAILY, Disp: 90 tablet, Rfl: 2 .  isosorbide mononitrate (IMDUR) 60 MG 24 hr tablet, TAKE 1 TABLET BY MOUTH DAILY, Disp: 90 tablet, Rfl: 3 .  Lancets (ONETOUCH DELICA PLUS [id number]) MISC, TAKE AS DIRECTED ONCE DAILY., Disp: , Rfl:  .  levalbuterol (XOPENEX HFA) 45 MCG/ACT inhaler, Inhale 1-2 puffs into the lungs every 6 (six) hours as needed for shortness of breath., Disp: 1 Inhaler, Rfl: 12 .  losartan (COZAAR) 50 MG tablet, TAKE 1 TABLET BY MOUTH DAILY, Disp: 30 tablet, Rfl: 6 .  metFORMIN (GLUCOPHAGE) 500 MG tablet, Take 500 mg by mouth 2 (two) times daily., Disp: , Rfl:  .  metoprolol tartrate (LOPRESSOR) 100 MG tablet, Take 1  tablet (100 mg total) by mouth 2 (two) times daily., Disp: 180 tablet, Rfl: 3 .  nitroGLYCERIN (NITROSTAT) 0.4 MG SL tablet, Place 0.4 mg under the tongue every 5 (five) minutes x 3 doses as needed for chest pain. , Disp: , Rfl:  .  ONETOUCH VERIO test strip, AS DIRECTED ONCE A DAY, Disp: , Rfl:  .  pantoprazole (PROTONIX) 40 MG tablet, TAKE 1 TABLET(40 MG) BY MOUTH DAILY, Disp: 90 tablet, Rfl: 1 .  rosuvastatin (CRESTOR) 40 MG tablet, TAKE 1 TABLET BY MOUTH DAILY, Disp: 90 tablet, Rfl: 1 .  spironolactone (ALDACTONE) 50 MG tablet, TAKE 1 TABLET(50 MG) BY MOUTH DAILY, Disp: 30 tablet, Rfl: 6 .  Tiotropium Bromide-Olodaterol (STIOLTO RESPIMAT) 2.5-2.5 MCG/ACT AERS, Inhale 2 puffs into the lungs daily.,  Disp: 4 g, Rfl: 11 .  warfarin (COUMADIN) 5 MG tablet, Take 1 tablet (5 mg total) by mouth daily. Refer to most recent anticoagulation note for most accurate information., Disp: 90 tablet, Rfl: 2 .  warfarin (COUMADIN) 7.5 MG tablet, TAKE 1 TABLET BY MOUTH EVERY EVENING, Disp: 90 tablet, Rfl: 1 Past Medical History:  Diagnosis Date  . COVID-19   . Hyperlipidemia   . Hypertension   . Rheumatic heart disease    MS/ AS    ASSESSMENT  Recent Results: The most recent result is correlated with 62.5 mg per week:  Lab Results  Component Value Date   INR 3.0 12/03/2019   INR 2.7 11/02/2019   INR 3.1 (A) 10/05/2019    Anticoagulation Dosing: Description   INR at goal. Continue current weekly dose to 12.5 mg on Tues and Thus and 7.5 mg all other days. Recheck INR in 4 weeks     INR today: Therapeutic. Continues to remain therapeutic on current dose. Pt reports that she is tolerating the new dose well and denies any bleeding or bruising symptoms. No relevant changes in diet, medications, or lifestyle.  PLAN Weekly dose was unchanged. Continue current weekly dose of 12.5 mg every Tues and Thus, and 7.5 mg all other days. Recheck in 4 weeks   Patient Instructions  INR at goal. Continue current weekly dose to 12.5 mg on Tues and Thus and 7.5 mg all other days. Recheck INR in 4 weeks  Patient advised to contact clinic or seek medical attention if signs/symptoms of bleeding or thromboembolism occur.  Patient verbalized understanding by repeating back information and was advised to contact me if further medication-related questions arise.   Follow-up Return in about 4 weeks (around 12/31/2019).  Kimberly Hobbs, PharmD  15 minutes spent face-to-face with the patient during the encounter. 50% of time spent on education, including signs/sx bleeding and clotting, as well as food and drug interactions with warfarin. 50% of time was spent on fingerprick POC INR sample collection,processing,  results determination, and documentation

## 2019-12-03 NOTE — Patient Instructions (Signed)
INR at goal. Continue current weekly dose to 12.5 mg on Tues and Thus and 7.5 mg all other days. Recheck INR in 4 weeks

## 2019-12-04 ENCOUNTER — Telehealth: Payer: Self-pay | Admitting: Cardiology

## 2019-12-04 DIAGNOSIS — R000 Tachycardia, unspecified: Secondary | ICD-10-CM

## 2019-12-04 NOTE — Telephone Encounter (Signed)
Received a call at this time from patient. Her HR is going from 70-120 bpm. She denies chest pain, shortness of breath, palpitations, leg edema, orthopnea, PND, TIA/syncope.  She is currently on metoprolol tartarate 100 mg bid, but does not see to be on diltiazem 180 mg, which was on med list at previous office visit.  Suspect she may have Afib, without any acute decompensation. She is already on warfarin. Continue metoprolol. Recommend EKG visit in the coming week. If no Afib seen on EKG, recommend 1 week cardiac monitor.  Staff, please arrange this.  Time spent: 8 min   Nigel Mormon, MD Pager: [phone] Office: [phone]

## 2019-12-06 ENCOUNTER — Other Ambulatory Visit: Payer: Self-pay

## 2019-12-06 ENCOUNTER — Encounter: Payer: Self-pay | Admitting: Cardiology

## 2019-12-06 ENCOUNTER — Ambulatory Visit: Payer: 59 | Admitting: Cardiology

## 2019-12-06 VITALS — BP 102/72 | HR 78 | Resp 16 | Ht 65.0 in | Wt 195.0 lb

## 2019-12-06 DIAGNOSIS — I480 Paroxysmal atrial fibrillation: Secondary | ICD-10-CM

## 2019-12-06 DIAGNOSIS — I25708 Atherosclerosis of coronary artery bypass graft(s), unspecified, with other forms of angina pectoris: Secondary | ICD-10-CM

## 2019-12-06 DIAGNOSIS — Z952 Presence of prosthetic heart valve: Secondary | ICD-10-CM

## 2019-12-06 DIAGNOSIS — I10 Essential (primary) hypertension: Secondary | ICD-10-CM

## 2019-12-06 DIAGNOSIS — R000 Tachycardia, unspecified: Secondary | ICD-10-CM

## 2019-12-06 NOTE — Progress Notes (Addendum)
Subjective:   Kimberly Hobbs, female    DOB: 17-Sep-1960, 59 y.o.   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  Patient has noticed irregular heart rhythm over the past few days. She has associated shortness of breath. She denies any chest pain.   Current Outpatient Medications on File Prior to Visit  Medication Sig Dispense Refill  . acetaminophen (TYLENOL) 500 MG tablet Take 500 mg by mouth every 6 (six) hours as needed (for pain.).    Marland Kitchen albuterol (VENTOLIN HFA) 108 (90 Base) MCG/ACT inhaler Inhale 2 puffs into the lungs every 6 (six) hours as needed for wheezing or shortness of breath.     Marland Kitchen aspirin EC 81 MG tablet Take 81 mg by mouth daily.     . citalopram (CELEXA) 40 MG tablet Take 20 mg by mouth 2 (two) times a day.    . clonazePAM (KLONOPIN) 0.5 MG tablet Take 0.5 mg by mouth at bedtime.     Marland Kitchen diltiazem (CARDIZEM CD) 180 MG 24 hr capsule TAKE 1 CAPSULE(180 MG) BY MOUTH DAILY 90 capsule 2  . furosemide (LASIX) 20 MG tablet TAKE 1 TABLET BY MOUTH DAILY 90 tablet 2  . HYDROcodone-acetaminophen (NORCO/VICODIN) 5-325 MG tablet Take 1 tablet by mouth as needed.    . isosorbide mononitrate (IMDUR) 60 MG 24 hr tablet TAKE 1 TABLET BY MOUTH DAILY 90 tablet 3  . Lancets (ONETOUCH DELICA PLUS [id number]) MISC TAKE AS DIRECTED ONCE DAILY.    Marland Kitchen levalbuterol (XOPENEX HFA) 45 MCG/ACT inhaler Inhale 1-2 puffs into the lungs every 6 (six) hours as needed for shortness of breath. 1 Inhaler 12  . losartan (COZAAR) 50 MG tablet TAKE 1 TABLET BY MOUTH DAILY 30 tablet 6  . metFORMIN (GLUCOPHAGE) 500 MG tablet Take 500 mg by mouth 2 (two) times daily.    . metoprolol tartrate (LOPRESSOR) 100 MG tablet Take 1 tablet (100 mg total) by mouth  2 (two) times daily. 180 tablet 3  . nitroGLYCERIN (NITROSTAT) 0.4 MG SL tablet Place 0.4 mg under the tongue every 5 (five) minutes x 3 doses as needed for chest pain.     Glory Rosebush VERIO test strip AS DIRECTED ONCE A DAY    . pantoprazole (PROTONIX) 40 MG tablet TAKE 1 TABLET(40 MG) BY MOUTH DAILY 90 tablet 1  . rosuvastatin (CRESTOR) 40 MG tablet TAKE 1 TABLET BY MOUTH DAILY 90 tablet 1  . spironolactone (ALDACTONE) 50 MG tablet TAKE 1 TABLET(50 MG) BY MOUTH DAILY 30 tablet 6  . Tiotropium Bromide-Olodaterol (STIOLTO RESPIMAT) 2.5-2.5 MCG/ACT AERS Inhale 2 puffs into the lungs daily. 4 g 11  . warfarin (COUMADIN) 5 MG tablet Take 1 tablet (5 mg total) by mouth daily. Refer to most recent anticoagulation note for most accurate information. 90 tablet 2  . warfarin (COUMADIN) 7.5 MG tablet TAKE 1 TABLET BY MOUTH EVERY EVENING 90 tablet 1   No current facility-administered medications on file prior to visit.    Cardiovascular studies:  EKG 12/06/2019: Atrial fibrillation 11 bpm  Possible old anteroseptal infarct Nonspecific ST-T changes  Coronary and bypass graft angiography 07/21/2018 and 08/18/2018: LM: Normal LAD: 100% mid occlusion. Mild distal disease LIMA-LAD: Patent LCx: Normal RCA: Prox 60% stenosis, mid 30-40% stenoses. TIMI III flow  in prox-mid RCA. 100% distal occlusion SVG-RCA: Patent. Ostial 40%. Distal RCA moderate diffuse disease.   Guide catheter angiography provided superior images. There is TIMI III flow in SVG which fills RCA all the way to the ostium. Even if she were to have FFR positive lesion in the graft, I do not think the benefits of a stent would outweigh the risk of losing the graft and the entire RCA territory circulation in a borderline lesion. Also, the prox RCA lesion is well bypassed by the SVG graft. Thus, I decided not to perform FFR/PCI to either of these lesions. Continue medical management.   [place] 07/21/2018: #1: RA: 18 mmHg RV 90/12 mmHg PA:  94/40 mmHg. Mean PA 63 mmHg. PW 33 mmHg  Lasix 80 mg  #2: RA 19 mmHg RV 60/0 mmHg PA: 56/16 mmHg. Mean PA 36 mmHg PW: 25 mmHg  CO: 5.4 L/min. CI 2.6 L/min/m2 PVR 2 WU  Impression: Elevated filling pressures Moderate WHO Grp II pulmonary hypertension Improvement in filling pressures with diuresis.  Echocardiogram 02/26/2018: Left ventricle cavity is normal in size. Mild concentric hypertrophy of the left ventricle. Abnormal septal wall motion due to post-operative valve. Diastolic function could not be assessed due to post op valve and CABG status.  Calculated EF 63%. Left atrial cavity is moderate to severely dilated measures 4.5 cm in long axis. Right atrial cavity is mildly dilated. Mechanical aortic valve with trace regurgitation. Mild aortic valve leaflet calcification. Mildly restricted aortic valve leaflets. Mild to moderate aortic valve stenosis. Aortic valve peak pressure gradient of  43 and mean gradient of 21 mmHg, calculated aortic valve area    0.88 cm. Mechanical mitral valve with trace regurgitation. Moderately restricted mitral valve leaflets. Mild mitral valve stenosis. Mitral valve peak pressure gradient of  26  and mean gradient of  6.2  mmHg, calculated mitral valve area 1.9   cm. Mild to moderate tricuspid regurgitation. Mild pulmonary hypertension. PA systolic pressure estimated at 39 mm Hg. Compared to the study done on 08/06/2017, no significant change.  TEE 07/21/2018:  1. The left ventricle has normal systolic function, with an ejection fraction of 55-60%. There is abnormal septal motion consistent with post-operative status.  2. The right ventricle has normal systolc function.  3. A 25 mm Regent mechanical valve is present in the mitral position. Procedure Date: 2018 Echo findings are consistent with is functioning normally the mitral prosthesis. Normal mitral valve prosthesis. Mean PG 6 mmHg, MVA 1.7 cm2. No thrombus seen.  (Patient known to have  subtherapeutic INR).  4. A 94mm St. Jude mechanical prosthesis valve is present in the aortic position. Procedure Date: 2018 Normal aortic valve prosthesis. Echo findings shows no evidence of Accelration time of 88 msec suggests normal functioning valve. Mean PG 25 mmHg  likely due to small annular diameter. No prosthetic valve stenosis noted. of the aortic prosthesis. No thrombus seen. (Patient known to have subtherapeutic INR).  Recent labs: Labs 02/16/2018: BNP 251 elevated  Labs 07/02/2017: A1c 5.7%, HB 12.9/HCT 38.6, platelets 220, normal indicis.  Serum glucose 90 mg, BUN 23, creatinine 0.75, CMP normal.  Potassium 4.6.  TSH normal.  Review of Systems  Cardiovascular: Negative for chest pain, dyspnea on exertion, leg swelling, palpitations and syncope.  Musculoskeletal: Positive for joint pain.         Vitals:   12/06/19 1009  BP: 102/72  Pulse: 78  Resp: 16  SpO2: 98%    Objective:     Physical Exam Vitals and  nursing note reviewed.  Constitutional:      General: She is not in acute distress. Neck:     Vascular: No JVD.  Cardiovascular:     Rate and Rhythm: Tachycardia present. Rhythm irregular.     Pulses: Intact distal pulses.     Heart sounds: Murmur heard.  Harsh midsystolic murmur is present with a grade of 2/6 at the upper right sternal border radiating to the neck. Crisp metallic S1 & S2. No diastolic murmur heard.     Pulmonary:     Effort: Pulmonary effort is normal.     Breath sounds: Normal breath sounds. No wheezing or rales.  Lymphadenopathy:     Cervical: No cervical adenopathy.  Neurological:     Cranial Nerves: No cranial nerve deficit.          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  Afib: Paroxysmal Afib. Rate around 110 bpm, associated with symptoms of shortness of breath.  INR 3.0 on 12/03/2019. Already on warfarin. Recommend cardioversion.  CAD of native and bypass grafts with angina, S/p MVR, AVR, subtherapeutic INR: Patent grafts with non-critical disease. Continue medical management with Aspirin, Imdur 60 mg daiy, losartan 50 mg daily, metoprolol tartarate 100 mg bid, crestor 40 mg daily.  S/p MVR, AVR: Continue f/u in INR clinic  Hypertension: Controlled.   Nigel Mormon, MD [facility] Pager: [phone] Office: [phone] If no answer Cell [phone]

## 2019-12-06 NOTE — Progress Notes (Signed)
EKG 12/06/2019: Atrial fibrillation 11 bpm  Possible old anteroseptal infarct Nonspecific ST-T changes

## 2019-12-08 ENCOUNTER — Ambulatory Visit: Payer: 59 | Admitting: Cardiology

## 2019-12-08 ENCOUNTER — Telehealth: Payer: Self-pay

## 2019-12-08 ENCOUNTER — Other Ambulatory Visit: Payer: Self-pay

## 2019-12-08 DIAGNOSIS — I4891 Unspecified atrial fibrillation: Secondary | ICD-10-CM

## 2019-12-08 NOTE — Progress Notes (Signed)
EKG 12/08/2019: Probable sinus rhythm 60 bpm Left anterior fasicular block Poor R wave progression  Afib no longer present   Cardioversion canceled. F/u in 4 weeks

## 2019-12-30 NOTE — Progress Notes (Signed)
No show

## 2019-12-31 ENCOUNTER — Ambulatory Visit: Payer: 59 | Admitting: Cardiology

## 2019-12-31 DIAGNOSIS — I10 Essential (primary) hypertension: Secondary | ICD-10-CM

## 2019-12-31 DIAGNOSIS — Z952 Presence of prosthetic heart valve: Secondary | ICD-10-CM

## 2019-12-31 DIAGNOSIS — I480 Paroxysmal atrial fibrillation: Secondary | ICD-10-CM

## 2019-12-31 DIAGNOSIS — I25708 Atherosclerosis of coronary artery bypass graft(s), unspecified, with other forms of angina pectoris: Secondary | ICD-10-CM

## 2020-01-04 NOTE — Progress Notes (Signed)
Subjective:   Kimberly Hobbs, female    DOB: 08/11/1960, 59 y.o.   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  She is doing well, denies chest pain, shortness of breath,  leg edema, orthopnea, PND, TIA/syncope. She has occasional palpitations, but does mot have any periods of sustained tachycardia.   She had COVID in 2020, but is not vaccinated yet.    Current Outpatient Medications on File Prior to Visit  Medication Sig Dispense Refill   acetaminophen (TYLENOL) 500 MG tablet Take 500 mg by mouth every 6 (six) hours as needed (for pain.).     albuterol (VENTOLIN HFA) 108 (90 Base) MCG/ACT inhaler Inhale 2 puffs into the lungs every 6 (six) hours as needed for wheezing or shortness of breath.      aspirin EC 81 MG tablet Take 81 mg by mouth daily.      citalopram (CELEXA) 40 MG tablet Take 20 mg by mouth 2 (two) times a day.     clonazePAM (KLONOPIN) 0.5 MG tablet Take 0.5 mg by mouth at bedtime.      diltiazem (CARDIZEM CD) 180 MG 24 hr capsule TAKE 1 CAPSULE(180 MG) BY MOUTH DAILY 90 capsule 2   furosemide (LASIX) 20 MG tablet TAKE 1 TABLET BY MOUTH DAILY 90 tablet 2   HYDROcodone-acetaminophen (NORCO/VICODIN) 5-325 MG tablet Take 1 tablet by mouth as needed.     isosorbide mononitrate (IMDUR) 60 MG 24 hr tablet TAKE 1 TABLET BY MOUTH DAILY 90 tablet 3   Lancets (ONETOUCH DELICA PLUS [id number]) MISC TAKE AS DIRECTED ONCE DAILY.     levalbuterol (XOPENEX HFA) 45 MCG/ACT inhaler Inhale 1-2 puffs into the lungs every 6 (six) hours as needed for shortness of breath. 1 Inhaler 12   losartan (COZAAR) 50 MG tablet TAKE 1 TABLET BY MOUTH DAILY 30 tablet 6   metFORMIN (GLUCOPHAGE) 500 MG tablet Take 500 mg by mouth  2 (two) times daily.     metoprolol tartrate (LOPRESSOR) 100 MG tablet Take 1 tablet (100 mg total) by mouth 2 (two) times daily. 180 tablet 3   nitroGLYCERIN (NITROSTAT) 0.4 MG SL tablet Place 0.4 mg under the tongue every 5 (five) minutes x 3 doses as needed for chest pain.      ONETOUCH VERIO test strip AS DIRECTED ONCE A DAY     pantoprazole (PROTONIX) 40 MG tablet TAKE 1 TABLET(40 MG) BY MOUTH DAILY 90 tablet 1   rosuvastatin (CRESTOR) 40 MG tablet TAKE 1 TABLET BY MOUTH DAILY 90 tablet 1   spironolactone (ALDACTONE) 50 MG tablet TAKE 1 TABLET(50 MG) BY MOUTH DAILY 30 tablet 6   Tiotropium Bromide-Olodaterol (STIOLTO RESPIMAT) 2.5-2.5 MCG/ACT AERS Inhale 2 puffs into the lungs daily. 4 g 11   warfarin (COUMADIN) 5 MG tablet Take 1 tablet (5 mg total) by mouth daily. Refer to most recent anticoagulation note for most accurate information. 90 tablet 2   warfarin (COUMADIN) 7.5 MG tablet TAKE 1 TABLET BY MOUTH EVERY EVENING 90 tablet 1   No current facility-administered medications on file prior to visit.    Cardiovascular studies:  EKG 12/06/2019: Atrial fibrillation 11 bpm  Possible old anteroseptal infarct Nonspecific ST-T changes  Coronary and bypass graft angiography 07/21/2018 and 08/18/2018: LM: Normal LAD: 100% mid  occlusion. Mild distal disease LIMA-LAD: Patent LCx: Normal RCA: Prox 60% stenosis, mid 30-40% stenoses. TIMI III flow in prox-mid RCA. 100% distal occlusion SVG-RCA: Patent. Ostial 40%. Distal RCA moderate diffuse disease.   Guide catheter angiography provided superior images. There is TIMI III flow in SVG which fills RCA all the way to the ostium. Even if she were to have FFR positive lesion in the graft, I do not think the benefits of a stent would outweigh the risk of losing the graft and the entire RCA territory circulation in a borderline lesion. Also, the prox RCA lesion is well bypassed by the SVG graft. Thus, I decided not to perform FFR/PCI to  either of these lesions. Continue medical management.   [place] 07/21/2018: #1: RA: 18 mmHg RV 90/12 mmHg PA: 94/40 mmHg. Mean PA 63 mmHg. PW 33 mmHg  Lasix 80 mg  #2: RA 19 mmHg RV 60/0 mmHg PA: 56/16 mmHg. Mean PA 36 mmHg PW: 25 mmHg  CO: 5.4 L/min. CI 2.6 L/min/m2 PVR 2 WU  Impression: Elevated filling pressures Moderate WHO Grp II pulmonary hypertension Improvement in filling pressures with diuresis.  Echocardiogram 02/26/2018: Left ventricle cavity is normal in size. Mild concentric hypertrophy of the left ventricle. Abnormal septal wall motion due to post-operative valve. Diastolic function could not be assessed due to post op valve and CABG status.  Calculated EF 63%. Left atrial cavity is moderate to severely dilated measures 4.5 cm in long axis. Right atrial cavity is mildly dilated. Mechanical aortic valve with trace regurgitation. Mild aortic valve leaflet calcification. Mildly restricted aortic valve leaflets. Mild to moderate aortic valve stenosis. Aortic valve peak pressure gradient of  43 and mean gradient of 21 mmHg, calculated aortic valve area    0.88 cm. Mechanical mitral valve with trace regurgitation. Moderately restricted mitral valve leaflets. Mild mitral valve stenosis. Mitral valve peak pressure gradient of  26  and mean gradient of  6.2  mmHg, calculated mitral valve area 1.9   cm. Mild to moderate tricuspid regurgitation. Mild pulmonary hypertension. PA systolic pressure estimated at 39 mm Hg. Compared to the study done on 08/06/2017, no significant change.  TEE 07/21/2018:  1. The left ventricle has normal systolic function, with an ejection fraction of 55-60%. There is abnormal septal motion consistent with post-operative status.  2. The right ventricle has normal systolc function.  3. A 25 mm Regent mechanical valve is present in the mitral position. Procedure Date: 2018 Echo findings are consistent with is functioning normally the mitral prosthesis.  Normal mitral valve prosthesis. Mean PG 6 mmHg, MVA 1.7 cm2. No thrombus seen.  (Patient known to have subtherapeutic INR).  4. A 53mm St. Jude mechanical prosthesis valve is present in the aortic position. Procedure Date: 2018 Normal aortic valve prosthesis. Echo findings shows no evidence of Accelration time of 88 msec suggests normal functioning valve. Mean PG 25 mmHg  likely due to small annular diameter. No prosthetic valve stenosis noted. of the aortic prosthesis. No thrombus seen. (Patient known to have subtherapeutic INR).  Recent labs: Labs 02/16/2018: BNP 251 elevated  Labs 07/02/2017: A1c 5.7%, HB 12.9/HCT 38.6, platelets 220, normal indicis.  Serum glucose 90 mg, BUN 23, creatinine 0.75, CMP normal.  Potassium 4.6.  TSH normal.  Review of Systems  Cardiovascular: Negative for chest pain, dyspnea on exertion, leg swelling, palpitations and syncope.  Musculoskeletal: Positive for joint pain.         Vitals:   01/05/20 0906  BP: (!) 130/51  Pulse: Marland Kitchen)  58  Resp: 16  SpO2: 95%    Objective:     Physical Exam Vitals and nursing note reviewed.  Constitutional:      General: She is not in acute distress. Neck:     Vascular: No JVD.  Cardiovascular:     Rate and Rhythm: Tachycardia present. Rhythm irregular.     Pulses: Intact distal pulses.     Heart sounds: Murmur heard.  Harsh midsystolic murmur is present with a grade of 2/6 at the upper right sternal border radiating to the neck. Crisp metallic S1 & S2. No diastolic murmur heard.     Pulmonary:     Effort: Pulmonary effort is normal.     Breath sounds: Normal breath sounds. No wheezing or rales.  Lymphadenopathy:     Cervical: No cervical adenopathy.  Neurological:     Cranial Nerves: No cranial nerve deficit.          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  Paroxysmal Afib: Currently in sinus rhythm  High [id number] score. Continue warfarin, as below.   CAD of native and bypass grafts without angina,  Patent grafts with non-critical disease.(07/2018) Continue medical management with Aspirin, Imdur 60 mg daiy, losartan 50 mg daily, metoprolol tartarate 100 mg bid, crestor 40 mg daily.  S/p MVR, AVR: Continue warfarin  Goal: 2.5-3.5 Indication: PAF, MVR, AVR Today's INR: 3.4 Current dose:7.5 mg daily  New dose: Same  Next INR check: 4 weeks   Hypertension: Controlled.  I discussed with the patient regarding the risks of Covid infection, hospitalization, and death.  I strongly recommended getting vaccinated against Covid as a safe and FDA approved measure to reduce risk of infection, hospitalization, as well as death.  F/u in 6 months w/me   Nigel Mormon, MD [facility] Pager: [phone] Office: [phone] If no answer Cell [phone]

## 2020-01-05 ENCOUNTER — Encounter: Payer: Self-pay | Admitting: Cardiology

## 2020-01-05 ENCOUNTER — Other Ambulatory Visit: Payer: Self-pay

## 2020-01-05 ENCOUNTER — Ambulatory Visit: Payer: 59 | Admitting: Cardiology

## 2020-01-05 VITALS — BP 130/51 | HR 58 | Resp 16 | Ht 65.0 in | Wt 201.0 lb

## 2020-01-05 DIAGNOSIS — I480 Paroxysmal atrial fibrillation: Secondary | ICD-10-CM

## 2020-01-05 DIAGNOSIS — I10 Essential (primary) hypertension: Secondary | ICD-10-CM

## 2020-01-05 DIAGNOSIS — Z5181 Encounter for therapeutic drug level monitoring: Secondary | ICD-10-CM

## 2020-01-05 DIAGNOSIS — Z7901 Long term (current) use of anticoagulants: Secondary | ICD-10-CM

## 2020-01-05 DIAGNOSIS — I25708 Atherosclerosis of coronary artery bypass graft(s), unspecified, with other forms of angina pectoris: Secondary | ICD-10-CM

## 2020-01-05 DIAGNOSIS — Z952 Presence of prosthetic heart valve: Secondary | ICD-10-CM

## 2020-01-05 LAB — POCT INR: INR: 3.4 — AB (ref 2.0–3.0)

## 2020-01-17 ENCOUNTER — Ambulatory Visit: Payer: 59 | Admitting: Cardiology

## 2020-01-17 ENCOUNTER — Other Ambulatory Visit: Payer: Self-pay

## 2020-01-17 ENCOUNTER — Ambulatory Visit
Admission: RE | Admit: 2020-01-17 | Discharge: 2020-01-17 | Disposition: A | Discharge status: Home / Self Care | Payer: 59 | Source: Ambulatory Visit | Attending: Cardiology | Admitting: Cardiology

## 2020-01-17 ENCOUNTER — Encounter: Payer: Self-pay | Admitting: Cardiology

## 2020-01-17 VITALS — BP 133/49 | HR 54 | Resp 16 | Ht 65.0 in | Wt 209.0 lb

## 2020-01-17 DIAGNOSIS — S59901A Unspecified injury of right elbow, initial encounter: Secondary | ICD-10-CM | POA: Insufficient documentation

## 2020-01-17 DIAGNOSIS — Z5181 Encounter for therapeutic drug level monitoring: Secondary | ICD-10-CM

## 2020-01-17 DIAGNOSIS — Z7901 Long term (current) use of anticoagulants: Secondary | ICD-10-CM

## 2020-01-17 DIAGNOSIS — I480 Paroxysmal atrial fibrillation: Secondary | ICD-10-CM

## 2020-01-17 LAB — POCT INR: INR: 4.3 — AB (ref 2.0–3.0)
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
## 2020-01-18 NOTE — Progress Notes (Signed)
Called and spoke with patient regarding elbow. Patient voiced understanding. AD/S

## 2020-01-20 ENCOUNTER — Telehealth: Payer: Self-pay

## 2020-01-20 ENCOUNTER — Other Ambulatory Visit: Payer: Self-pay

## 2020-01-20 MED ORDER — NITROGLYCERIN 0.4 MG SL SUBL: 0.4000 mg | SUBLINGUAL_TABLET | SUBLINGUAL | 3 refills | Status: DC | PRN

## 2020-01-20 MED ORDER — ROSUVASTATIN CALCIUM 40 MG PO TABS
40.0000 mg | ORAL_TABLET | Freq: Every day | ORAL | 1 refills | Status: DC
Start: 2020-01-20 — End: 2022-08-29

## 2020-01-20 NOTE — Telephone Encounter (Signed)
Telephone encounter:  Reason for call: Patient called to say her pulse has been low for the past few days. 127/55 HR =42  Usual provider: MP  Last office visit: 01/17/20  Next office visit: 07/05/20   Last hospitalization: NA  Current Outpatient Medications on File Prior to Visit  Medication Sig Dispense Refill  . acetaminophen (TYLENOL) 500 MG tablet Take 500 mg by mouth every 6 (six) hours as needed (for pain.).    Marland Kitchen albuterol (VENTOLIN HFA) 108 (90 Base) MCG/ACT inhaler Inhale 2 puffs into the lungs every 6 (six) hours as needed for wheezing or shortness of breath.     Marland Kitchen aspirin EC 81 MG tablet Take 81 mg by mouth daily.     . citalopram (CELEXA) 40 MG tablet Take 20 mg by mouth 2 (two) times a day.    . clonazePAM (KLONOPIN) 0.5 MG tablet Take 0.5 mg by mouth at bedtime.     Marland Kitchen diltiazem (CARDIZEM CD) 180 MG 24 hr capsule TAKE 1 CAPSULE(180 MG) BY MOUTH DAILY 90 capsule 2  . furosemide (LASIX) 20 MG tablet TAKE 1 TABLET BY MOUTH DAILY 90 tablet 2  . isosorbide mononitrate (IMDUR) 60 MG 24 hr tablet TAKE 1 TABLET BY MOUTH DAILY 90 tablet 3  . Lancets (ONETOUCH DELICA PLUS [id number]) MISC TAKE AS DIRECTED ONCE DAILY.    Marland Kitchen levalbuterol (XOPENEX HFA) 45 MCG/ACT inhaler Inhale 1-2 puffs into the lungs every 6 (six) hours as needed for shortness of breath. 1 Inhaler 12  . losartan (COZAAR) 50 MG tablet TAKE 1 TABLET BY MOUTH DAILY 30 tablet 6  . metFORMIN (GLUCOPHAGE) 500 MG tablet Take 500 mg by mouth 2 (two) times daily.    . metoprolol tartrate (LOPRESSOR) 100 MG tablet Take 1 tablet (100 mg total) by mouth 2 (two) times daily. 180 tablet 3  . nitroGLYCERIN (NITROSTAT) 0.4 MG SL tablet Place 0.4 mg under the tongue every 5 (five) minutes x 3 doses as needed for chest pain.     Glory Rosebush VERIO test strip AS DIRECTED ONCE A DAY    . pantoprazole (PROTONIX) 40 MG tablet TAKE 1 TABLET(40 MG) BY MOUTH DAILY 90 tablet 1  . rosuvastatin (CRESTOR) 40 MG tablet TAKE 1 TABLET BY MOUTH DAILY 90  tablet 1  . spironolactone (ALDACTONE) 50 MG tablet TAKE 1 TABLET(50 MG) BY MOUTH DAILY 30 tablet 6  . Tiotropium Bromide-Olodaterol (STIOLTO RESPIMAT) 2.5-2.5 MCG/ACT AERS Inhale 2 puffs into the lungs daily. 4 g 11  . warfarin (COUMADIN) 5 MG tablet Take 1 tablet (5 mg total) by mouth daily. Refer to most recent anticoagulation note for most accurate information. 90 tablet 2  . warfarin (COUMADIN) 7.5 MG tablet TAKE 1 TABLET BY MOUTH EVERY EVENING 90 tablet 1   No current facility-administered medications on file prior to visit.

## 2020-01-20 NOTE — Telephone Encounter (Signed)
Not to worry unless she has lightheadedness, syncope etc.

## 2020-01-24 ENCOUNTER — Other Ambulatory Visit: Payer: Self-pay

## 2020-01-24 ENCOUNTER — Ambulatory Visit: Payer: 59 | Admitting: Pharmacist

## 2020-01-24 ENCOUNTER — Other Ambulatory Visit: Payer: Self-pay | Admitting: Pharmacist

## 2020-01-24 VITALS — BP 119/53 | HR 50

## 2020-01-24 DIAGNOSIS — Z952 Presence of prosthetic heart valve: Secondary | ICD-10-CM

## 2020-01-24 DIAGNOSIS — I25708 Atherosclerosis of coronary artery bypass graft(s), unspecified, with other forms of angina pectoris: Secondary | ICD-10-CM

## 2020-01-24 DIAGNOSIS — Z7901 Long term (current) use of anticoagulants: Secondary | ICD-10-CM

## 2020-01-24 DIAGNOSIS — R079 Chest pain, unspecified: Secondary | ICD-10-CM

## 2020-01-24 DIAGNOSIS — Z5181 Encounter for therapeutic drug level monitoring: Secondary | ICD-10-CM

## 2020-01-24 LAB — POCT INR: INR: 3 (ref 2.0–3.0)

## 2020-01-24 MED ORDER — NITROGLYCERIN 0.4 MG SL SUBL: 0.4000 mg | SUBLINGUAL_TABLET | SUBLINGUAL | 3 refills | Status: DC | PRN

## 2020-01-24 NOTE — Patient Instructions (Signed)
INR at goal. Continue current weekly dose of 12.5 mg on Tues and Thus and 7.5 mg all other days. Recheck INR in 4 weeks

## 2020-01-24 NOTE — Telephone Encounter (Signed)
Okay with me 

## 2020-01-24 NOTE — Progress Notes (Signed)
Anticoagulation Management Kimberly Hobbs is a 59 y.o. female who reports to the clinic for monitoring of warfarin treatment.    Indication: Hx of Mechanical mitral and aortic valve replacement   Duration: indefinite Supervising physician: [facility] Visit History:  Patient does not report signs/symptoms of bleeding or thromboembolism   Other recent changes: No changes in diet, medications, lifestyle.   Continues to exercising 30-60 mins a day consisting of walking on her treadmill, inclined steps, or planks. Pt maintaining steady Vitk intake of spinach 2-3x/week and avocado toast 1x/week.  Recent arm pain complains slowly improving. Xray negative for fracture. Recent referral to sport medicine and pt was instructed to continuing wearing her wrist braises.   Anticoagulation Episode Summary    Current INR goal:  2.5-3.5  TTR:  66.8 % (1.8 y)  Next INR check:  02/21/2020  INR from last check:  3.0 (01/24/2020)  Weekly max warfarin dose:    Target end date:  Indefinite  INR check location:    Preferred lab:    Send INR reminders to:     Indications   H/O mitral valve replacement with mechanical valve [Z95.2] Monitoring for long-term anticoagulant use [Z51.81 Z79.01]       Comments:          Allergies  Allergen Reactions  . Iodinated Diagnostic Agents Other (See Comments)    Patient is unsure of reaction type  . Penicillins Other (See Comments)    Immune to drug , does not work per patient  Did it involve swelling of the face/tongue/throat, SOB, or low BP? No Did it involve sudden or severe rash/hives, skin peeling, or any reaction on the inside of your mouth or nose? No Did you need to seek medical attention at a hospital or doctor's office? No When did it last happen?NOT A TRUE ALLERGY If all above answers are "NO", may proceed with cephalosporin use.     Current Outpatient Medications:  .  acetaminophen (TYLENOL) 500 MG tablet, Take  500 mg by mouth every 6 (six) hours as needed (for pain.)., Disp: , Rfl:  .  albuterol (VENTOLIN HFA) 108 (90 Base) MCG/ACT inhaler, Inhale 2 puffs into the lungs every 6 (six) hours as needed for wheezing or shortness of breath. , Disp: , Rfl:  .  aspirin EC 81 MG tablet, Take 81 mg by mouth daily. , Disp: , Rfl:  .  citalopram (CELEXA) 40 MG tablet, Take 20 mg by mouth 2 (two) times a day., Disp: , Rfl:  .  clonazePAM (KLONOPIN) 0.5 MG tablet, Take 0.5 mg by mouth at bedtime. , Disp: , Rfl:  .  diltiazem (CARDIZEM CD) 180 MG 24 hr capsule, TAKE 1 CAPSULE(180 MG) BY MOUTH DAILY, Disp: 90 capsule, Rfl: 2 .  furosemide (LASIX) 20 MG tablet, TAKE 1 TABLET BY MOUTH DAILY, Disp: 90 tablet, Rfl: 2 .  isosorbide mononitrate (IMDUR) 60 MG 24 hr tablet, TAKE 1 TABLET BY MOUTH DAILY, Disp: 90 tablet, Rfl: 3 .  Lancets (ONETOUCH DELICA PLUS [id number]) MISC, TAKE AS DIRECTED ONCE DAILY., Disp: , Rfl:  .  levalbuterol (XOPENEX HFA) 45 MCG/ACT inhaler, Inhale 1-2 puffs into the lungs every 6 (six) hours as needed for shortness of breath., Disp: 1 Inhaler, Rfl: 12 .  losartan (COZAAR) 50 MG tablet, TAKE 1 TABLET BY MOUTH DAILY, Disp: 30 tablet, Rfl: 6 .  metFORMIN (GLUCOPHAGE) 500 MG tablet, Take 500 mg by mouth 2 (two) times daily., Disp: , Rfl:  .  metoprolol tartrate (LOPRESSOR) 100 MG tablet, Take 1 tablet (100 mg total) by mouth 2 (two) times daily., Disp: 180 tablet, Rfl: 3 .  nitroGLYCERIN (NITROSTAT) 0.4 MG SL tablet, Place 1 tablet (0.4 mg total) under the tongue every 5 (five) minutes x 3 doses as needed for chest pain., Disp: 25 tablet, Rfl: 3 .  ONETOUCH VERIO test strip, AS DIRECTED ONCE A DAY, Disp: , Rfl:  .  pantoprazole (PROTONIX) 40 MG tablet, TAKE 1 TABLET(40 MG) BY MOUTH DAILY, Disp: 90 tablet, Rfl: 1 .  rosuvastatin (CRESTOR) 40 MG tablet, Take 1 tablet (40 mg total) by mouth daily., Disp: 90 tablet, Rfl: 1 .  spironolactone (ALDACTONE) 50 MG tablet, TAKE 1 TABLET(50 MG) BY MOUTH DAILY, Disp:  30 tablet, Rfl: 6 .  Tiotropium Bromide-Olodaterol (STIOLTO RESPIMAT) 2.5-2.5 MCG/ACT AERS, Inhale 2 puffs into the lungs daily., Disp: 4 g, Rfl: 11 .  warfarin (COUMADIN) 5 MG tablet, Take 1 tablet (5 mg total) by mouth daily. Refer to most recent anticoagulation note for most accurate information., Disp: 90 tablet, Rfl: 2 .  warfarin (COUMADIN) 7.5 MG tablet, TAKE 1 TABLET BY MOUTH EVERY EVENING, Disp: 90 tablet, Rfl: 1 Past Medical History:  Diagnosis Date  . COVID-19   . Hyperlipidemia   . Hypertension   . Rheumatic heart disease    MS/ AS    ASSESSMENT  Recent Results: The most recent result is correlated with 62.5 mg per week:  Lab Results  Component Value Date   INR 3.0 01/24/2020   INR 4.3 (A) 01/17/2020   INR 3.4 (A) 01/05/2020    Anticoagulation Dosing: Description   INR at goal. Continue current weekly dose of 12.5 mg on Tues and Thus and 7.5 mg all other days. Recheck INR in 4 weeks     INR today: Therapeutic. Continues to remain therapeutic on current dose. Recent spike improved with held dose. Pt was previously therapeutic on current dose.Denies any complains of bleeding or bruising symptoms. Denies any other relevant changes in her diet, medications, or lifestyle.   PLAN Weekly dose was unchanged. Continue current weekly dose of 12.5 mg every Tues and Thus, and 7.5 mg all other days. Recheck in 4 weeks   Patient Instructions  INR at goal. Continue current weekly dose of 12.5 mg on Tues and Thus and 7.5 mg all other days. Recheck INR in 4 weeks  Patient advised to contact clinic or seek medical attention if signs/symptoms of bleeding or thromboembolism occur.  Patient verbalized understanding by repeating back information and was advised to contact me if further medication-related questions arise.   Follow-up Return in about 29 days (around 02/22/2020).  [name], PharmD  15 minutes spent face-to-face with the patient during the encounter. 50% of time  spent on education, including signs/sx bleeding and clotting, as well as food and drug interactions with warfarin. 50% of time was spent on fingerprick POC INR sample collection,processing, results determination, and documentation

## 2020-01-27 ENCOUNTER — Ambulatory Visit: Payer: 59 | Admitting: Cardiology

## 2020-01-27 ENCOUNTER — Ambulatory Visit: Payer: Self-pay | Admitting: General Surgery

## 2020-01-27 NOTE — H&P (Signed)
History of Present Illness Ralene Ok MD; 01/27/2020 10:33 AM) The patient is a 59 year old female who presents with a complaint of back lipoma. Patient is a 59 year old female who is referred for a back lipoma Patient states that the lipomas been there for approximately one year that she knows of. She states it has not gotten larger however it is causing her some discomfort. She states that she would like to have this removed. Patient states that she's had no signs of infection or drainage from the area.  Patient has a history of previous CABG as well as heart valve replacement. Patient currently on Coumadin. Patient sees Dr. Rosalyn Gess and her cardiologist manages his Coumadin.      Past Surgical History Darden Palmer, [place]; 01/27/2020 10:05 AM) Coronary Artery Bypass Graft  Gallbladder Surgery - Laparoscopic  Valve Replacement   Diagnostic Studies History Darden Palmer, [place]; 01/27/2020 10:05 AM) Colonoscopy  never Mammogram  1-3 years ago Pap Smear  1-5 years ago  Allergies Darden Palmer, RMA; 01/27/2020 10:07 AM) Contrast Media Ready-Box *MEDICAL DEVICES AND SUPPLIES*  Penicillin V Potassium *PENICILLINS*  Allergies Reconciled   Medication History Darden Palmer, RMA; 01/27/2020 10:09 AM) Acetaminophen (500MG Tablet, Oral) Active. Aspirin (81MG Tablet DR, Oral) Active. dilTIAZem HCl ER Coated Beads (180MG Capsule ER 24HR, Oral) Active. Furosemide (20MG Tablet, Oral) Active. Isosorbide Mononitrate ER (60MG Tablet ER 24HR, Oral) Active. Losartan Potassium (50MG Tablet, Oral) Active. OneTouch Delica Lancets 88C Active. Pantoprazole Sodium (40MG Tablet DR, Oral) Active. Rosuvastatin Calcium (40MG Tablet, Oral) Active. Spironolactone (50MG Tablet, Oral) Active. Warfarin Sodium (10MG Tablet, Oral) Active. Nitroglycerin (0.4MG Tab Sublingual, Sublingual) Active. OneTouch Verio (In Vitro) Active. Medications Reconciled  Social History Darden Palmer,  [place]; 01/27/2020 10:05 AM) No alcohol use  No caffeine use  No drug use  Tobacco use  Former smoker.  Family History Darden Palmer, [place]; 01/27/2020 10:05 AM) Alcohol Abuse  Daughter, Father, Mother. Arthritis  Brother, Father, Mother. Cancer  Father, Mother. Cervical Cancer  Mother. Depression  Brother, Mother. Diabetes Mellitus  Brother, Father, Mother. Heart Disease  Brother, Father, Mother. Heart disease in female family member before age 15  Heart disease in female family member before age 58  Hypertension  Brother, Daughter, Father, Mother, Sister. Ischemic Bowel Disease  Mother. Respiratory Condition  Mother.  Pregnancy / Birth History Darden Palmer, [place]; 01/27/2020 10:05 AM) Age at menarche  81 years. Age of menopause  57-55 Gravida  4 Length (months) of breastfeeding  3-6 Maternal age  88-20 Para  33  Other Problems Darden Palmer, [place]; 01/27/2020 10:05 AM) Anxiety Disorder  Cholelithiasis  Congestive Heart Failure  Diabetes Mellitus  Gastroesophageal Reflux Disease  High blood pressure  Hypercholesterolemia  Myocardial infarction     Review of Systems Ralene Ok MD; 01/27/2020 10:32 AM) General Not Present- Appetite Loss, Chills, Fatigue, Fever, Night Sweats, Weight Gain and Weight Loss. Skin Not Present- Change in Wart/Mole, Dryness, Hives, Jaundice, New Lesions, Non-Healing Wounds, Rash and Ulcer. HEENT Not Present- Earache, Hearing Loss, Hoarseness, Nose Bleed, Oral Ulcers, Ringing in the Ears, Seasonal Allergies, Sinus Pain, Sore Throat, Visual Disturbances, Wears glasses/contact lenses and Yellow Eyes. Respiratory Not Present- Bloody sputum, Chronic Cough, Difficulty Breathing, Snoring and Wheezing. Breast Not Present- Breast Mass, Breast Pain, Nipple Discharge and Skin Changes. Cardiovascular Not Present- Chest Pain, Difficulty Breathing Lying Down, Leg Cramps, Palpitations, Rapid Heart Rate, Shortness of Breath and Swelling of  Extremities. Gastrointestinal Not Present- Abdominal Pain, Bloating, Bloody Stool, Change in Bowel Habits, Chronic diarrhea, Constipation,  Difficulty Swallowing, Excessive gas, Gets full quickly at meals, Hemorrhoids, Indigestion, Nausea, Rectal Pain and Vomiting. Female Genitourinary Not Present- Frequency, Nocturia, Painful Urination, Pelvic Pain and Urgency. Musculoskeletal Not Present- Back Pain, Joint Pain, Joint Stiffness, Muscle Pain, Muscle Weakness and Swelling of Extremities. Neurological Not Present- Decreased Memory, Fainting, Headaches, Numbness, Seizures, Tingling, Tremor, Trouble walking and Weakness. Psychiatric Present- Anxiety. Not Present- Bipolar, Change in Sleep Pattern, Depression, Fearful and Frequent crying. Endocrine Present- New Diabetes. Not Present- Cold Intolerance, Excessive Hunger, Hair Changes, Heat Intolerance and Hot flashes. Hematology Present- Blood Thinners. Not Present- Easy Bruising, Excessive bleeding, Gland problems, HIV and Persistent Infections. All other systems negative  Vitals Lattie Haw Caldwell RMA; 01/27/2020 10:10 AM) 01/27/2020 10:09 AM Weight: 201.5 lb Height: 65in Body Surface Area: 1.98 m Body Mass Index: 33.53 kg/m  Temp.: 97.33F  Pulse: 55 (Regular)  P.OX: 96% (Room air) BP: 120/64(Sitting, Left Arm, Standard)       Physical Exam Ralene Ok MD; 01/27/2020 10:33 AM) The physical exam findings are as follows: Note: Constitutional: No acute distress, conversant, appears stated age  Eyes: Anicteric sclerae, moist conjunctiva, no lid lag  Neck: No thyromegaly, trachea midline, no cervical lymphadenopathy  Lungs: Clear to auscultation biilaterally, normal respiratory effot  Cardiovascular: regular rate & rhythm, no murmurs, no peripheal edema, pedal pulses 2+  GI: Soft, no masses or hepatosplenomegaly, non-tender to palpation  MSK: Normal gait, no clubbing cyanosis, edema  Skin: No rashes, palpation reveals normal  skin turgor, left paraspinous mass approximately 3 x 4 cm, soft subcutaneous  Psychiatric: Appropriate judgment and insight, oriented to person, place, and time    Assessment & Plan Ralene Ok MD; 01/27/2020 10:34 AM) LIPOMA OF BACK (D17.1) Impression: Patient is a 59 year old female, who comes in secondary to a back lipoma Patient has a history of valve replacement, currently on Coumadin. We will obtain clearance from cardiologist to be off the Coumadin with possible Lovenox bridge.  1. We will proceed to the operating room for excision of mass under local/Mac anesthesia  2. I discussed with the patient the risks and benefits of the procedure to include but not limited to: Infection, bleeding, damage to structures, possible recurrence, and possible skin dehiscence. The patient was understanding and wishes to proceed.

## 2020-02-14 ENCOUNTER — Other Ambulatory Visit: Payer: Self-pay | Admitting: Internal Medicine

## 2020-02-14 DIAGNOSIS — R0609 Other forms of dyspnea: Secondary | ICD-10-CM

## 2020-02-22 ENCOUNTER — Other Ambulatory Visit: Payer: Self-pay

## 2020-02-22 ENCOUNTER — Ambulatory Visit: Payer: 59 | Admitting: Pharmacist

## 2020-02-22 DIAGNOSIS — Z952 Presence of prosthetic heart valve: Secondary | ICD-10-CM

## 2020-02-22 DIAGNOSIS — Z5181 Encounter for therapeutic drug level monitoring: Secondary | ICD-10-CM

## 2020-02-22 DIAGNOSIS — Z7901 Long term (current) use of anticoagulants: Secondary | ICD-10-CM

## 2020-02-22 LAB — POCT INR: INR: 3.9 — AB (ref 2.0–3.0)

## 2020-02-22 NOTE — Patient Instructions (Signed)
INR above goal. Take 7.5 mg today and then continue current weekly dose of 12.5 mg on Tues and Thus and 7.5 mg all other days. Recheck INR in 3 weeks

## 2020-02-22 NOTE — Progress Notes (Signed)
Anticoagulation Management Kimberly Hobbs is a 60 y.o. female who reports to the clinic for monitoring of warfarin treatment.    Indication: Hx of Mechanical mitral and aortic valve replacement   Duration: indefinite Supervising physician: [facility] Visit History:  Patient does not report signs/symptoms of bleeding or thromboembolism   Other recent changes: No changes in diet, medications, lifestyle.   Continues to exercising 30-60 mins a day consisting of walking on her treadmill, inclined steps, or planks. Pt maintaining steady Vit-K intake of spinach 2-3x/week and avocado toast 1x/week.  Wrist pain improving. Opting to postpone wrist surgery. Pt notes that she had a large serving of margarita over the weekend. Planing on avoiding future excessive intake.   Anticoagulation Episode Summary    Current INR goal:  2.5-3.5  TTR:  66.3 % (1.9 y)  Next INR check:  03/14/2020  INR from last check:  3.9 (02/22/2020)  Weekly max warfarin dose:    Target end date:  Indefinite  INR check location:    Preferred lab:    Send INR reminders to:     Indications   H/O mitral valve replacement with mechanical valve [Z95.2] Monitoring for long-term anticoagulant use [Z51.81 Z79.01]       Comments:          Allergies  Allergen Reactions   Iodinated Diagnostic Agents Other (See Comments)    Patient is unsure of reaction type   Penicillins Other (See Comments)    Immune to drug , does not work per patient  Did it involve swelling of the face/tongue/throat, SOB, or low BP? No Did it involve sudden or severe rash/hives, skin peeling, or any reaction on the inside of your mouth or nose? No Did you need to seek medical attention at a hospital or doctor's office? No When did it last happen?NOT A TRUE ALLERGY If all above answers are NO, may proceed with cephalosporin use.     Current Outpatient Medications:    acetaminophen (TYLENOL) 500 MG tablet,  Take 500 mg by mouth every 6 (six) hours as needed (for pain.)., Disp: , Rfl:    albuterol (VENTOLIN HFA) 108 (90 Base) MCG/ACT inhaler, Inhale 2 puffs into the lungs every 6 (six) hours as needed for wheezing or shortness of breath. , Disp: , Rfl:    aspirin EC 81 MG tablet, Take 81 mg by mouth daily. , Disp: , Rfl:    citalopram (CELEXA) 40 MG tablet, Take 20 mg by mouth 2 (two) times a day., Disp: , Rfl:    clonazePAM (KLONOPIN) 0.5 MG tablet, Take 0.5 mg by mouth at bedtime. , Disp: , Rfl:    diltiazem (CARDIZEM CD) 180 MG 24 hr capsule, TAKE 1 CAPSULE(180 MG) BY MOUTH DAILY, Disp: 90 capsule, Rfl: 2   furosemide (LASIX) 20 MG tablet, TAKE 1 TABLET BY MOUTH DAILY, Disp: 90 tablet, Rfl: 2   isosorbide mononitrate (IMDUR) 60 MG 24 hr tablet, TAKE 1 TABLET BY MOUTH DAILY, Disp: 90 tablet, Rfl: 3   Lancets (ONETOUCH DELICA PLUS Q000111Q) MISC, TAKE AS DIRECTED ONCE DAILY., Disp: , Rfl:    levalbuterol (XOPENEX HFA) 45 MCG/ACT inhaler, Inhale 1-2 puffs into the lungs every 6 (six) hours as needed for shortness of breath., Disp: 1 Inhaler, Rfl: 12   losartan (COZAAR) 50 MG tablet, TAKE 1 TABLET BY MOUTH DAILY, Disp: 30 tablet, Rfl: 6   metFORMIN (GLUCOPHAGE) 500 MG tablet, Take 500 mg by mouth 2 (two) times daily., Disp: , Rfl:  metoprolol tartrate (LOPRESSOR) 100 MG tablet, Take 1 tablet (100 mg total) by mouth 2 (two) times daily., Disp: 180 tablet, Rfl: 3   nitroGLYCERIN (NITROSTAT) 0.4 MG SL tablet, Place 1 tablet (0.4 mg total) under the tongue every 5 (five) minutes x 3 doses as needed for chest pain., Disp: 25 tablet, Rfl: 3   ONETOUCH VERIO test strip, AS DIRECTED ONCE A DAY, Disp: , Rfl:    pantoprazole (PROTONIX) 40 MG tablet, TAKE 1 TABLET(40 MG) BY MOUTH DAILY, Disp: 90 tablet, Rfl: 1   rosuvastatin (CRESTOR) 40 MG tablet, Take 1 tablet (40 mg total) by mouth daily., Disp: 90 tablet, Rfl: 1   spironolactone (ALDACTONE) 50 MG tablet, TAKE 1 TABLET(50 MG) BY MOUTH DAILY,  Disp: 30 tablet, Rfl: 6   STIOLTO RESPIMAT 2.5-2.5 MCG/ACT AERS, INHALE 2 PUFFS INTO THE LUNGS DAILY, Disp: 4 g, Rfl: 5   warfarin (COUMADIN) 5 MG tablet, Take 1 tablet (5 mg total) by mouth daily. Refer to most recent anticoagulation note for most accurate information., Disp: 90 tablet, Rfl: 2   warfarin (COUMADIN) 7.5 MG tablet, TAKE 1 TABLET BY MOUTH EVERY EVENING, Disp: 90 tablet, Rfl: 1 Past Medical History:  Diagnosis Date   COVID-19    Hyperlipidemia    Hypertension    Rheumatic heart disease    MS/ AS    ASSESSMENT  Recent Results: The most recent result is correlated with 62.5 mg per week:  Lab Results  Component Value Date   INR 3.9 (A) 02/22/2020   INR 3.0 01/24/2020   INR 4.3 (A) 01/17/2020    Anticoagulation Dosing: Description   INR above goal. Take 7.5 mg today and then continue current weekly dose of 12.5 mg on Tues and Thus and 7.5 mg all other days. Recheck INR in 3 weeks     INR today: Supratherapeutic. Slightly above goal possible in setting of recent alcohol intake. Previously controlled on current dose. Denies any complains of bleeding or bruising symptoms. Denies any other relevant changes in her diet, medications, or lifestyles. Will dose adjust today and continue current therapy and close follow up.   PLAN Weekly dose was unchanged by 0% to 62.5 mg/week. Take 7.5 mg today and then continue current weekly dose of 12.5 mg every Tues and Thus, and 7.5 mg all other days. Recheck in 3 weeks   Patient Instructions  INR above goal. Take 7.5 mg today and then continue current weekly dose of 12.5 mg on Tues and Thus and 7.5 mg all other days. Recheck INR in 3 weeks  Patient advised to contact clinic or seek medical attention if signs/symptoms of bleeding or thromboembolism occur.  Patient verbalized understanding by repeating back information and was advised to contact me if further medication-related questions arise.   Follow-up Return in about 3  weeks (around 03/14/2020).  Leonides Schanz, PharmD  15 minutes spent face-to-face with the patient during the encounter. 50% of time spent on education, including signs/sx bleeding and clotting, as well as food and drug interactions with warfarin. 50% of time was spent on fingerprick POC INR sample collection,processing, results determination, and documentation

## 2020-03-01 ENCOUNTER — Telehealth: Payer: Self-pay

## 2020-03-01 NOTE — Telephone Encounter (Signed)
Patient called that her heart rate drop to 45 she recently checked it it was 52 patient wants to know if that is normal or does she need to be doing something different please advise

## 2020-03-01 NOTE — Telephone Encounter (Signed)
That is about her baseline heart rate. I wouldn't worry, unless she has any new symptoms of lightheadedness/passing out etc.  Thanks MJP

## 2020-03-01 NOTE — Telephone Encounter (Signed)
Spoke to patient

## 2020-03-14 ENCOUNTER — Other Ambulatory Visit: Payer: Self-pay

## 2020-03-14 ENCOUNTER — Ambulatory Visit: Payer: 59 | Admitting: Pharmacist

## 2020-03-14 DIAGNOSIS — Z952 Presence of prosthetic heart valve: Secondary | ICD-10-CM

## 2020-03-14 DIAGNOSIS — Z5181 Encounter for therapeutic drug level monitoring: Secondary | ICD-10-CM

## 2020-03-14 DIAGNOSIS — Z7901 Long term (current) use of anticoagulants: Secondary | ICD-10-CM

## 2020-03-14 LAB — POCT INR: INR: 2.5 (ref 2.0–3.0)

## 2020-03-14 NOTE — Progress Notes (Signed)
Anticoagulation Management Kimberly Hobbs is a 60 y.o. female who reports to the clinic for monitoring of warfarin treatment.    Indication: Hx of Mechanical mitral and aortic valve replacement   Duration: indefinite Supervising physician: [facility] Visit History:  Patient does not report signs/symptoms of bleeding or thromboembolism   Other recent changes: No changes in diet, medications, lifestyle.   Continues to exercising 30-60 mins a day consisting of walking on her treadmill, inclined steps, or planks. Pt maintaining steady Vit-K intake of spinach 2-3x/week and avocado toast 1x/week.  Wrist pain improving. Continues to have back pain that pt is managing with therapeutics. Maybe considering surgery in the future, but hasnt had any surgery date scheduled as of yet. Aware to notify the coumadin clinic if surgery is scheduled.   Anticoagulation Episode Summary    Current INR goal:  2.5-3.5  TTR:  66.5 % (1.9 y)  Next INR check:  04/11/2020  INR from last check:  2.5 (03/14/2020)  Weekly max warfarin dose:    Target end date:  Indefinite  INR check location:    Preferred lab:    Send INR reminders to:     Indications   H/O mitral valve replacement with mechanical valve [Z95.2] Monitoring for long-term anticoagulant use [Z51.81 Z79.01]       Comments:          Allergies  Allergen Reactions   Iodinated Diagnostic Agents Other (See Comments)    Patient is unsure of reaction type   Penicillins Other (See Comments)    Immune to drug , does not work per patient  Did it involve swelling of the face/tongue/throat, SOB, or low BP? No Did it involve sudden or severe rash/hives, skin peeling, or any reaction on the inside of your mouth or nose? No Did you need to seek medical attention at a hospital or doctor's office? No When did it last happen?NOT A TRUE ALLERGY If all above answers are NO, may proceed with cephalosporin use.      Current Outpatient Medications:    acetaminophen (TYLENOL) 500 MG tablet, Take 500 mg by mouth every 6 (six) hours as needed (for pain.)., Disp: , Rfl:    albuterol (VENTOLIN HFA) 108 (90 Base) MCG/ACT inhaler, Inhale 2 puffs into the lungs every 6 (six) hours as needed for wheezing or shortness of breath. , Disp: , Rfl:    aspirin EC 81 MG tablet, Take 81 mg by mouth daily. , Disp: , Rfl:    citalopram (CELEXA) 40 MG tablet, Take 20 mg by mouth 2 (two) times a day., Disp: , Rfl:    clonazePAM (KLONOPIN) 0.5 MG tablet, Take 0.5 mg by mouth at bedtime. , Disp: , Rfl:    diltiazem (CARDIZEM CD) 180 MG 24 hr capsule, TAKE 1 CAPSULE(180 MG) BY MOUTH DAILY, Disp: 90 capsule, Rfl: 2   furosemide (LASIX) 20 MG tablet, TAKE 1 TABLET BY MOUTH DAILY, Disp: 90 tablet, Rfl: 2   isosorbide mononitrate (IMDUR) 60 MG 24 hr tablet, TAKE 1 TABLET BY MOUTH DAILY, Disp: 90 tablet, Rfl: 3   Lancets (ONETOUCH DELICA PLUS [id number]) MISC, TAKE AS DIRECTED ONCE DAILY., Disp: , Rfl:    levalbuterol (XOPENEX HFA) 45 MCG/ACT inhaler, Inhale 1-2 puffs into the lungs every 6 (six) hours as needed for shortness of breath., Disp: 1 Inhaler, Rfl: 12   losartan (COZAAR) 50 MG tablet, TAKE 1 TABLET BY MOUTH DAILY, Disp: 30 tablet, Rfl: 6   metFORMIN (GLUCOPHAGE) 500 MG  tablet, Take 500 mg by mouth 2 (two) times daily., Disp: , Rfl:    metoprolol tartrate (LOPRESSOR) 100 MG tablet, Take 1 tablet (100 mg total) by mouth 2 (two) times daily., Disp: 180 tablet, Rfl: 3   nitroGLYCERIN (NITROSTAT) 0.4 MG SL tablet, Place 1 tablet (0.4 mg total) under the tongue every 5 (five) minutes x 3 doses as needed for chest pain., Disp: 25 tablet, Rfl: 3   ONETOUCH VERIO test strip, AS DIRECTED ONCE A DAY, Disp: , Rfl:    pantoprazole (PROTONIX) 40 MG tablet, TAKE 1 TABLET(40 MG) BY MOUTH DAILY, Disp: 90 tablet, Rfl: 1   rosuvastatin (CRESTOR) 40 MG tablet, Take 1 tablet (40 mg total) by mouth daily., Disp: 90 tablet, Rfl:  1   spironolactone (ALDACTONE) 50 MG tablet, TAKE 1 TABLET(50 MG) BY MOUTH DAILY, Disp: 30 tablet, Rfl: 6   STIOLTO RESPIMAT 2.5-2.5 MCG/ACT AERS, INHALE 2 PUFFS INTO THE LUNGS DAILY, Disp: 4 g, Rfl: 5   warfarin (COUMADIN) 5 MG tablet, Take 1 tablet (5 mg total) by mouth daily. Refer to most recent anticoagulation note for most accurate information., Disp: 90 tablet, Rfl: 2   warfarin (COUMADIN) 7.5 MG tablet, TAKE 1 TABLET BY MOUTH EVERY EVENING, Disp: 90 tablet, Rfl: 1 Past Medical History:  Diagnosis Date   COVID-19    Hyperlipidemia    Hypertension    Rheumatic heart disease    MS/ AS    ASSESSMENT  Recent Results: The most recent result is correlated with 62.5 mg per week:  Lab Results  Component Value Date   INR 2.5 03/14/2020   INR 3.9 (A) 02/22/2020   INR 3.0 01/24/2020    Anticoagulation Dosing: Description   INR at goal. Continue current weekly dose of 12.5 mg on Tues and Thus and 7.5 mg all other days. Recheck INR in 4 weeks     INR today: Therapeutic. Returns back within therapeutic range following recent dose adjustment. Previously controlled on current dose. Denies any complains of bleeding or bruising symptoms. Denies any other relevant changes in her diet, medications, or lifestyles. Will continue current dose and chronic monitoring.   PLAN Weekly dose was unchanged by 0% to 62.5 mg/week. Continue current weekly dose of 12.5 mg every Tues and Thus, and 7.5 mg all other days. Recheck in 4 weeks   Patient Instructions  INR at goal. Continue current weekly dose of 12.5 mg on Tues and Thus and 7.5 mg all other days. Recheck INR in 4 weeks  Patient advised to contact clinic or seek medical attention if signs/symptoms of bleeding or thromboembolism occur.  Patient verbalized understanding by repeating back information and was advised to contact me if further medication-related questions arise.   Follow-up Return in about 4 weeks (around  04/11/2020).  [name], PharmD  15 minutes spent face-to-face with the patient during the encounter. 50% of time spent on education, including signs/sx bleeding and clotting, as well as food and drug interactions with warfarin. 50% of time was spent on fingerprick POC INR sample collection,processing, results determination, and documentation

## 2020-03-14 NOTE — Patient Instructions (Signed)
INR at goal. Continue current weekly dose of 12.5 mg on Tues and Thus and 7.5 mg all other days. Recheck INR in 4 weeks

## 2020-03-22 ENCOUNTER — Other Ambulatory Visit: Payer: Self-pay | Admitting: Cardiology

## 2020-03-22 DIAGNOSIS — Z5181 Encounter for therapeutic drug level monitoring: Secondary | ICD-10-CM

## 2020-03-22 DIAGNOSIS — Z7901 Long term (current) use of anticoagulants: Secondary | ICD-10-CM

## 2020-03-22 DIAGNOSIS — Z952 Presence of prosthetic heart valve: Secondary | ICD-10-CM

## 2020-03-22 NOTE — Telephone Encounter (Signed)
Currently on 12.5 mg (5 mg x 1 and 7.5 mg x 1) every Tue, Thu; 7.5 mg (7.5 mg x 1) all other days

## 2020-03-24 ENCOUNTER — Other Ambulatory Visit: Payer: Self-pay | Admitting: Cardiology

## 2020-03-24 DIAGNOSIS — I480 Paroxysmal atrial fibrillation: Secondary | ICD-10-CM

## 2020-03-24 DIAGNOSIS — I10 Essential (primary) hypertension: Secondary | ICD-10-CM

## 2020-03-24 DIAGNOSIS — Z952 Presence of prosthetic heart valve: Secondary | ICD-10-CM

## 2020-03-28 ENCOUNTER — Telehealth: Payer: Self-pay | Admitting: Pharmacist

## 2020-03-28 NOTE — Telephone Encounter (Signed)
PCP faxed OV notes from 03/27/20 w/ CC of blood in bowel movement. Per OV notes, pt noted to have stool that "looked very red - she did have to strain to have a VM, but it was not painful and no abdominal pain. Today she also noted some redness but no straining and no pain" Only recent change was pt having elderberry jelly. No noted interaction per Lexicomp and Micromedix with warfarin. INR checked during the OV resulted slightly supratheraputic at 3.7. INR goal of 2.5-3.5. Last checked INR on 03/14/20 of 2.5. Home maintenance dose of 12.5 mg every Tue/Thurs and 7.5 mg all other days. Called to review with pt. Pt confirmed having 2 episodes of hematochezia on Sun and Mon (2/6-2/7). Reports seeing bright red stool. Denies any recent complains of hemorrhoids, anal fissure, recent intake of Pepto bismol, iron supplementations. Pt reports that she hasnt had any bowel movements today and denies any continued bleeding episodes.   Instructed pt to hold warfarin for 2 days and to recheck INR in office on 03/30/20. Pt was referred to GI from PCP. Will follow up in more detail during follow up INR check. Instructed pt to continue monitoring and to notify her PCP or coumadin clinic if symptoms continue to persist or worsen

## 2020-03-30 ENCOUNTER — Ambulatory Visit: Payer: 59 | Admitting: Pharmacist

## 2020-03-30 ENCOUNTER — Other Ambulatory Visit: Payer: Self-pay

## 2020-03-30 DIAGNOSIS — Z7901 Long term (current) use of anticoagulants: Secondary | ICD-10-CM

## 2020-03-30 DIAGNOSIS — I480 Paroxysmal atrial fibrillation: Secondary | ICD-10-CM

## 2020-03-30 DIAGNOSIS — Z952 Presence of prosthetic heart valve: Secondary | ICD-10-CM

## 2020-03-30 DIAGNOSIS — Z5181 Encounter for therapeutic drug level monitoring: Secondary | ICD-10-CM

## 2020-03-30 LAB — POCT INR: INR: 1.4 — AB (ref 2.0–3.0)

## 2020-03-30 NOTE — Patient Instructions (Signed)
INR below goal. Take 17.5 mg today and 12.5 mg tomorrow and then continue taking 12.5 mg every Tue, Thurs and 7.5 mg all other days. Recheck INR in 1 weeks

## 2020-03-30 NOTE — Progress Notes (Signed)
Anticoagulation Management Kimberly Hobbs is a 60 y.o. female who reports to the clinic for monitoring of warfarin treatment.    Indication: atrial fibrillation and Hx of Mechanical mitral and aortic valve replacement ; CHA2DS2 Vasc Score 4 (Female, HTN, DM, Vascular disease hx), HAS-BLED 1 (ASA, prior bleed hx (hematuria, hematochezia) Duration: indefinite Supervising physician: [facility] Visit History:  Patient does not report signs/symptoms of bleeding or thromboembolism   Other recent changes: No changes in diet, medications, lifestyle.   Pt maintaining steady Vit-K intake of avocado toast 1x/week, salad 4/week.  Continues to have mild wrist pain and back pain. Maybe considering surgery in the future, but hasnt had any surgery date scheduled as of yet. Aware to notify the coumadin clinic if surgery is scheduled.   Pt had a recent OV w/ PCP on 03/27/20 w/ CC of blood in bowel movement. Per PCP OV notes, pt had an episodes of bright red color stool without any noted indication of hemorrhoids or anal fissure. INR noted to be 3.7 during OV Pt did note that she recently had a recent UTI and was started on Macrobid. Called pt on 03/28/20 to review OV notes with pt. Pt was told to hold warfarin for 2 days in setting of potential active bleed. Bleeding episodes have since resolved with no noted recurrence. Pt has an GI referral in place and is scheduled for initial consultation on April 8th. Denies any recurrent bleeding episodes since.   Anticoagulation Episode Summary    Current INR goal:  2.5-3.5  TTR:  65.0 % (2 y)  Next INR check:  04/06/2020  INR from last check:  1.4 (03/30/2020)  Weekly max warfarin dose:    Target end date:  Indefinite  INR check location:    Preferred lab:    Send INR reminders to:     Indications   H/O mitral valve replacement with mechanical valve [Z95.2] Monitoring for long-term anticoagulant use [Z51.81 Z79.01]       Comments:           Allergies  Allergen Reactions   Iodinated Diagnostic Agents Other (See Comments)    Patient is unsure of reaction type   Penicillins Other (See Comments)    Immune to drug , does not work per patient  Did it involve swelling of the face/tongue/throat, SOB, or low BP? No Did it involve sudden or severe rash/hives, skin peeling, or any reaction on the inside of your mouth or nose? No Did you need to seek medical attention at a hospital or doctor's office? No When did it last happen?NOT A TRUE ALLERGY If all above answers are NO, may proceed with cephalosporin use.     Current Outpatient Medications:    acetaminophen (TYLENOL) 500 MG tablet, Take 500 mg by mouth every 6 (six) hours as needed (for pain.)., Disp: , Rfl:    albuterol (VENTOLIN HFA) 108 (90 Base) MCG/ACT inhaler, Inhale 2 puffs into the lungs every 6 (six) hours as needed for wheezing or shortness of breath. , Disp: , Rfl:    aspirin EC 81 MG tablet, Take 81 mg by mouth daily. , Disp: , Rfl:    citalopram (CELEXA) 40 MG tablet, Take 20 mg by mouth 2 (two) times a day., Disp: , Rfl:    clonazePAM (KLONOPIN) 0.5 MG tablet, Take 0.5 mg by mouth at bedtime. , Disp: , Rfl:    diltiazem (CARDIZEM CD) 180 MG 24 hr capsule, TAKE 1 CAPSULE(180 MG) BY MOUTH  DAILY, Disp: 90 capsule, Rfl: 2   furosemide (LASIX) 20 MG tablet, TAKE 1 TABLET BY MOUTH DAILY, Disp: 90 tablet, Rfl: 2   isosorbide mononitrate (IMDUR) 60 MG 24 hr tablet, TAKE 1 TABLET BY MOUTH DAILY, Disp: 90 tablet, Rfl: 3   Lancets (ONETOUCH DELICA PLUS [id number]) MISC, TAKE AS DIRECTED ONCE DAILY., Disp: , Rfl:    levalbuterol (XOPENEX HFA) 45 MCG/ACT inhaler, Inhale 1-2 puffs into the lungs every 6 (six) hours as needed for shortness of breath., Disp: 1 Inhaler, Rfl: 12   losartan (COZAAR) 50 MG tablet, TAKE 1 TABLET BY MOUTH DAILY, Disp: 30 tablet, Rfl: 6   metFORMIN (GLUCOPHAGE) 500 MG tablet, Take 500 mg by mouth 2 (two) times daily., Disp: ,  Rfl:    metoprolol tartrate (LOPRESSOR) 100 MG tablet, TAKE 1 TABLET(100 MG) BY MOUTH TWICE DAILY, Disp: 180 tablet, Rfl: 3   nitroGLYCERIN (NITROSTAT) 0.4 MG SL tablet, Place 1 tablet (0.4 mg total) under the tongue every 5 (five) minutes x 3 doses as needed for chest pain., Disp: 25 tablet, Rfl: 3   ONETOUCH VERIO test strip, AS DIRECTED ONCE A DAY, Disp: , Rfl:    pantoprazole (PROTONIX) 40 MG tablet, TAKE 1 TABLET(40 MG) BY MOUTH DAILY, Disp: 90 tablet, Rfl: 1   rosuvastatin (CRESTOR) 40 MG tablet, Take 1 tablet (40 mg total) by mouth daily., Disp: 90 tablet, Rfl: 1   spironolactone (ALDACTONE) 50 MG tablet, TAKE 1 TABLET(50 MG) BY MOUTH DAILY, Disp: 30 tablet, Rfl: 6   STIOLTO RESPIMAT 2.5-2.5 MCG/ACT AERS, INHALE 2 PUFFS INTO THE LUNGS DAILY, Disp: 4 g, Rfl: 5   warfarin (COUMADIN) 5 MG tablet, Take 1 tablet (5 mg total) by mouth daily. Refer to most recent anticoagulation note for most accurate information., Disp: 90 tablet, Rfl: 2   warfarin (COUMADIN) 7.5 MG tablet, TAKE 1 TABLET BY MOUTH EVERY EVENING, Disp: 90 tablet, Rfl: 2 Past Medical History:  Diagnosis Date   COVID-19    Hyperlipidemia    Hypertension    Rheumatic heart disease    MS/ AS    ASSESSMENT  Recent Results: The most recent result is correlated with 62.5 mg per week:  Lab Results  Component Value Date   INR 1.4 (A) 03/30/2020   INR 2.5 03/14/2020   INR 3.9 (A) 02/22/2020    Anticoagulation Dosing: Description   INR below goal. Take 17.5 mg today and 12.5 mg tomorrow and then continue taking 12.5 mg every Tue, Thurs and 7.5 mg all other days. Recheck INR in 1 weeks     INR today: Subtherapeutic. Recent labile INR readings. Held doses in setting of potential GI bleed likely contributing to current subtherapeutic Pt with hx of mitral and atrial valve and afib w/ CHADsVASC of 4, would need lovenox, however in the setting of recent active bleed, will consider slowly increasing weekly dose and  close monitoring. Denies any other relevant changes in diet, medications, lifestyle.  Previously controlled on current dose. Denies any other complains of active bleeding or bruising symptoms.    PLAN Weekly dose was unchanged by 0% to 62.5 mg/week. Take 17.5 mg today and 12.5 mg tomorrow and then continue current weekly dose of 12.5 mg every Tues and Thus, and 7.5 mg all other days. Recheck in 1 weeks   Patient Instructions  INR below goal. Take 17.5 mg today and 12.5 mg tomorrow and then continue taking 12.5 mg every Tue, Thurs and 7.5 mg all other days. Recheck INR in 1  weeks  Patient advised to contact clinic or seek medical attention if signs/symptoms of bleeding or thromboembolism occur.  Patient verbalized understanding by repeating back information and was advised to contact me if further medication-related questions arise.   Follow-up Return in about 1 week (around 04/06/2020).  [name], PharmD  15 minutes spent face-to-face with the patient during the encounter. 50% of time spent on education, including signs/sx bleeding and clotting, as well as food and drug interactions with warfarin. 50% of time was spent on fingerprick POC INR sample collection,processing, results determination, and documentation

## 2020-04-06 ENCOUNTER — Ambulatory Visit: Payer: 59 | Admitting: Pharmacist

## 2020-04-06 ENCOUNTER — Other Ambulatory Visit: Payer: Self-pay

## 2020-04-06 DIAGNOSIS — Z5181 Encounter for therapeutic drug level monitoring: Secondary | ICD-10-CM

## 2020-04-06 DIAGNOSIS — Z952 Presence of prosthetic heart valve: Secondary | ICD-10-CM

## 2020-04-06 LAB — POCT INR: INR: 3 (ref 2.0–3.0)

## 2020-04-06 NOTE — Progress Notes (Signed)
Anticoagulation Management Kimberly Hobbs is a 60 y.o. female who reports to the clinic for monitoring of warfarin treatment.    Indication: atrial fibrillation and Hx of Mechanical mitral and aortic valve replacement ; CHA2DS2 Vasc Score 4 (Female, HTN, DM, Vascular disease hx), HAS-BLED 1 (ASA, prior bleed hx (hematuria, hematochezia) Duration: indefinite Supervising physician: [facility] Visit History:  Patient does not report signs/symptoms of bleeding or thromboembolism   Other recent changes: No changes in diet, medications, lifestyle.   Pt maintaining steady Vit-K intake of avocado toast 1x/week, salad 4/week.  Continues to have mild wrist pain and back pain. Maybe considering surgery in the future, but hasnt had any surgery date scheduled as of yet. Aware to notify the coumadin clinic if surgery is scheduled.   Pt had a recent OV w/ PCP on 03/27/20 w/ CC of blood in bowel movement. Per PCP OV notes, pt had an episodes of bright red color stool without any noted indication of hemorrhoids or anal fissure. INR noted to be 3.7 during OV Pt did note that she recently had a recent UTI and was started on Macrobid. Called pt on 03/28/20 to review OV notes with pt. Pt was told to hold warfarin for 2 days in setting of potential active bleed. Bleeding episodes have since resolved with no noted recurrence. Pt has an GI referral in place and is scheduled for initial consultation on April 8th. Denies any recurrent bleeding episodes since.   Bleeding episodes have resolved since last check. Denies any further episodes of blood with bowel movement. Pt still planing on keeping the GI appt to review risk factors.   Anticoagulation Episode Summary    Current INR goal:  2.5-3.5  TTR:  64.7 % (2 y)  Next INR check:  04/20/2020  INR from last check:  3.0 (04/06/2020)  Weekly max warfarin dose:    Target end date:  Indefinite  INR check location:    Preferred lab:    Send INR  reminders to:     Indications   H/O mitral valve replacement with mechanical valve [Z95.2] Monitoring for long-term anticoagulant use [Z51.81 Z79.01]       Comments:          Allergies  Allergen Reactions  . Iodinated Diagnostic Agents Other (See Comments)    Patient is unsure of reaction type  . Penicillins Other (See Comments)    Immune to drug , does not work per patient  Did it involve swelling of the face/tongue/throat, SOB, or low BP? No Did it involve sudden or severe rash/hives, skin peeling, or any reaction on the inside of your mouth or nose? No Did you need to seek medical attention at a hospital or doctor's office? No When did it last happen?NOT A TRUE ALLERGY If all above answers are "NO", may proceed with cephalosporin use.     Current Outpatient Medications:  .  acetaminophen (TYLENOL) 500 MG tablet, Take 500 mg by mouth every 6 (six) hours as needed (for pain.)., Disp: , Rfl:  .  albuterol (VENTOLIN HFA) 108 (90 Base) MCG/ACT inhaler, Inhale 2 puffs into the lungs every 6 (six) hours as needed for wheezing or shortness of breath. , Disp: , Rfl:  .  aspirin EC 81 MG tablet, Take 81 mg by mouth daily. , Disp: , Rfl:  .  citalopram (CELEXA) 40 MG tablet, Take 20 mg by mouth 2 (two) times a day., Disp: , Rfl:  .  clonazePAM (KLONOPIN) 0.5  MG tablet, Take 0.5 mg by mouth at bedtime. , Disp: , Rfl:  .  diltiazem (CARDIZEM CD) 180 MG 24 hr capsule, TAKE 1 CAPSULE(180 MG) BY MOUTH DAILY, Disp: 90 capsule, Rfl: 2 .  furosemide (LASIX) 20 MG tablet, TAKE 1 TABLET BY MOUTH DAILY, Disp: 90 tablet, Rfl: 2 .  isosorbide mononitrate (IMDUR) 60 MG 24 hr tablet, TAKE 1 TABLET BY MOUTH DAILY, Disp: 90 tablet, Rfl: 3 .  Lancets (ONETOUCH DELICA PLUS [id number]) MISC, TAKE AS DIRECTED ONCE DAILY., Disp: , Rfl:  .  levalbuterol (XOPENEX HFA) 45 MCG/ACT inhaler, Inhale 1-2 puffs into the lungs every 6 (six) hours as needed for shortness of breath., Disp: 1 Inhaler, Rfl: 12 .   losartan (COZAAR) 50 MG tablet, TAKE 1 TABLET BY MOUTH DAILY, Disp: 30 tablet, Rfl: 6 .  metFORMIN (GLUCOPHAGE) 500 MG tablet, Take 500 mg by mouth 2 (two) times daily., Disp: , Rfl:  .  metoprolol tartrate (LOPRESSOR) 100 MG tablet, TAKE 1 TABLET(100 MG) BY MOUTH TWICE DAILY, Disp: 180 tablet, Rfl: 3 .  nitroGLYCERIN (NITROSTAT) 0.4 MG SL tablet, Place 1 tablet (0.4 mg total) under the tongue every 5 (five) minutes x 3 doses as needed for chest pain., Disp: 25 tablet, Rfl: 3 .  ONETOUCH VERIO test strip, AS DIRECTED ONCE A DAY, Disp: , Rfl:  .  pantoprazole (PROTONIX) 40 MG tablet, TAKE 1 TABLET(40 MG) BY MOUTH DAILY, Disp: 90 tablet, Rfl: 1 .  rosuvastatin (CRESTOR) 40 MG tablet, Take 1 tablet (40 mg total) by mouth daily., Disp: 90 tablet, Rfl: 1 .  spironolactone (ALDACTONE) 50 MG tablet, TAKE 1 TABLET(50 MG) BY MOUTH DAILY, Disp: 30 tablet, Rfl: 6 .  STIOLTO RESPIMAT 2.5-2.5 MCG/ACT AERS, INHALE 2 PUFFS INTO THE LUNGS DAILY, Disp: 4 g, Rfl: 5 .  warfarin (COUMADIN) 5 MG tablet, Take 1 tablet (5 mg total) by mouth daily. Refer to most recent anticoagulation note for most accurate information., Disp: 90 tablet, Rfl: 2 .  warfarin (COUMADIN) 7.5 MG tablet, TAKE 1 TABLET BY MOUTH EVERY EVENING, Disp: 90 tablet, Rfl: 2 Past Medical History:  Diagnosis Date  . COVID-19   . Hyperlipidemia   . Hypertension   . Rheumatic heart disease    MS/ AS    ASSESSMENT  Recent Results: The most recent result is correlated with 62.5 mg per week:  Lab Results  Component Value Date   INR 3.0 04/06/2020   INR 1.4 (A) 03/30/2020   INR 2.5 03/14/2020    Anticoagulation Dosing: Description   INR at goal. Continue taking 12.5 mg every Tue, Thurs and 7.5 mg all other days. Recheck INR in 2 weeks     INR today: Therapeutic. Following recent boost dose. Pt previously therapeutic on current dose. No further GI bleedings complains. No other relevant changes in her diet, medications, or lifestyle. INR bump  of 1.6 over 1 week with boost doses. Will continue previous maintenance dose and close monitoring to ensure INR continues to remain within therapeutic range. GI follow up to review GI bleed concerns   PLAN Weekly dose was unchanged by 0% to 62.5 mg/week. Continue current weekly dose of 12.5 mg every Tues and Thus, and 7.5 mg all other days. Recheck in 2 weeks   Patient Instructions  INR at goal. Continue taking 12.5 mg every Tue, Thurs and 7.5 mg all other days. Recheck INR in 2 weeks  Patient advised to contact clinic or seek medical attention if signs/symptoms of bleeding or thromboembolism occur.  Patient verbalized understanding by repeating back information and was advised to contact me if further medication-related questions arise.   Follow-up Return in about 2 weeks (around 04/20/2020).  [name], PharmD  15 minutes spent face-to-face with the patient during the encounter. 50% of time spent on education, including signs/sx bleeding and clotting, as well as food and drug interactions with warfarin. 50% of time was spent on fingerprick POC INR sample collection,processing, results determination, and documentation

## 2020-04-06 NOTE — Patient Instructions (Signed)
INR at goal. Continue taking 12.5 mg every Tue, Thurs and 7.5 mg all other days. Recheck INR in 2 weeks

## 2020-04-08 ENCOUNTER — Telehealth: Payer: Self-pay | Admitting: Cardiology

## 2020-04-08 DIAGNOSIS — I480 Paroxysmal atrial fibrillation: Secondary | ICD-10-CM

## 2020-04-08 NOTE — Telephone Encounter (Signed)
Patient called with complaints of tachycardia up to 130 bpm, now down to 100s. No associated symptoms. Known paroxysmal Afib. Continue metoprolol and diltiazem for now. Will see next week for follow up.   Nigel Mormon, MD Pager: [phone] Office: [phone]

## 2020-04-10 ENCOUNTER — Other Ambulatory Visit: Payer: Self-pay

## 2020-04-10 ENCOUNTER — Encounter: Payer: Self-pay | Admitting: Student

## 2020-04-10 ENCOUNTER — Ambulatory Visit: Payer: 59 | Admitting: Student

## 2020-04-10 VITALS — BP 113/51 | HR 56 | Temp 97.2°F | Ht 65.0 in | Wt 206.0 lb

## 2020-04-10 DIAGNOSIS — I480 Paroxysmal atrial fibrillation: Secondary | ICD-10-CM

## 2020-04-10 DIAGNOSIS — Z952 Presence of prosthetic heart valve: Secondary | ICD-10-CM

## 2020-04-10 DIAGNOSIS — I25708 Atherosclerosis of coronary artery bypass graft(s), unspecified, with other forms of angina pectoris: Secondary | ICD-10-CM

## 2020-04-10 DIAGNOSIS — Z7901 Long term (current) use of anticoagulants: Secondary | ICD-10-CM

## 2020-04-10 DIAGNOSIS — Z5181 Encounter for therapeutic drug level monitoring: Secondary | ICD-10-CM

## 2020-04-10 NOTE — Telephone Encounter (Signed)
Called pt to inform her about the message above. Pt mention she is at the office.

## 2020-04-10 NOTE — Progress Notes (Signed)
Subjective:   Kimberly Hobbs, female    DOB: 10/28/1960, 60 y.o.   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  Patient presents for urgent visit with concerns of atrial fibrillation recurrence.  She states on Saturday she noticed palpitations and her heart rate elevated at 130 bpm.  She has also noticed it for the last 2 days dyspnea and fatigue on exertion.  Patient has history of paroxysmal atrial fibrillation, she reports she has had 2-3 recurrences since 2018, reportedly she typically self converts after 2-3 days.  She is on long-term anticoagulation with warfarin, follows closely with our offices anticoagulation clinic.  She denies chest pain, dizziness, syncope, near syncope, swelling.  Denies bleeding diathesis. Denies symptoms suggestive of TIA or CVA.    Current Outpatient Medications on File Prior to Visit  Medication Sig Dispense Refill  . acetaminophen (TYLENOL) 500 MG tablet Take 500 mg by mouth every 6 (six) hours as needed (for pain.).    Marland Kitchen albuterol (VENTOLIN HFA) 108 (90 Base) MCG/ACT inhaler Inhale 2 puffs into the lungs every 6 (six) hours as needed for wheezing or shortness of breath.     Marland Kitchen aspirin EC 81 MG tablet Take 81 mg by mouth daily.     . citalopram (CELEXA) 40 MG tablet Take 20 mg by mouth 2 (two) times a day.    . clonazePAM (KLONOPIN) 0.5 MG tablet Take 0.5 mg by mouth at bedtime.     Marland Kitchen diltiazem (CARDIZEM CD) 180 MG 24 hr capsule TAKE 1 CAPSULE(180 MG) BY MOUTH DAILY 90 capsule 2  . furosemide (LASIX) 20 MG tablet TAKE 1 TABLET BY MOUTH DAILY 90 tablet 2  . isosorbide mononitrate (IMDUR) 60 MG 24 hr tablet TAKE 1 TABLET BY MOUTH DAILY 90 tablet 3  . Lancets (ONETOUCH DELICA PLUS [id number]) MISC  TAKE AS DIRECTED ONCE DAILY.    Marland Kitchen levalbuterol (XOPENEX HFA) 45 MCG/ACT inhaler Inhale 1-2 puffs into the lungs every 6 (six) hours as needed for shortness of breath. 1 Inhaler 12  . losartan (COZAAR) 50 MG tablet TAKE 1 TABLET BY MOUTH DAILY 30 tablet 6  . metFORMIN (GLUCOPHAGE) 500 MG tablet Take 500 mg by mouth 2 (two) times daily.    . metoprolol tartrate (LOPRESSOR) 100 MG tablet TAKE 1 TABLET(100 MG) BY MOUTH TWICE DAILY 180 tablet 3  . nitroGLYCERIN (NITROSTAT) 0.4 MG SL tablet Place 1 tablet (0.4 mg total) under the tongue every 5 (five) minutes x 3 doses as needed for chest pain. 25 tablet 3  . ONETOUCH VERIO test strip AS DIRECTED ONCE A DAY    . pantoprazole (PROTONIX) 40 MG tablet TAKE 1 TABLET(40 MG) BY MOUTH DAILY 90 tablet 1  . rosuvastatin (CRESTOR) 40 MG tablet Take 1 tablet (40 mg total) by mouth daily. 90 tablet 1  . spironolactone (ALDACTONE) 50 MG tablet TAKE 1 TABLET(50 MG) BY MOUTH DAILY 30 tablet 6  . STIOLTO RESPIMAT 2.5-2.5 MCG/ACT AERS INHALE 2 PUFFS INTO THE LUNGS DAILY 4 g 5  . warfarin (COUMADIN) 5 MG tablet Take 1 tablet (5 mg total) by mouth daily. Refer to most recent anticoagulation note for most accurate information. 90 tablet 2  . warfarin (COUMADIN) 7.5 MG tablet TAKE 1 TABLET BY MOUTH EVERY EVENING 90  tablet 2   No current facility-administered medications on file prior to visit.    Cardiovascular studies: EKG 04/10/2020: Atrial fibrillation with rapid ventricular response at 120 bpm Poor R wave progression, cannot exclude anteroseptal infarct old. Nonspecific ST-T changes, unchanged compared to previous EKG on 12/06/2019  EKG 12/06/2019: Atrial fibrillation 11 bpm  Possible old anteroseptal infarct Nonspecific ST-T changes  Coronary and bypass graft angiography 07/21/2018 and 08/18/2018: LM: Normal LAD: 100% mid occlusion. Mild distal disease LIMA-LAD: Patent LCx: Normal RCA: Prox 60% stenosis, mid 30-40% stenoses. TIMI III flow in prox-mid RCA.  100% distal occlusion SVG-RCA: Patent. Ostial 40%. Distal RCA moderate diffuse disease.   Guide catheter angiography provided superior images. There is TIMI III flow in SVG which fills RCA all the way to the ostium. Even if she were to have FFR positive lesion in the graft, I do not think the benefits of a stent would outweigh the risk of losing the graft and the entire RCA territory circulation in a borderline lesion. Also, the prox RCA lesion is well bypassed by the SVG graft. Thus, I decided not to perform FFR/PCI to either of these lesions. Continue medical management.   [place] 07/21/2018: #1: RA: 18 mmHg RV 90/12 mmHg PA: 94/40 mmHg. Mean PA 63 mmHg. PW 33 mmHg  Lasix 80 mg  #2: RA 19 mmHg RV 60/0 mmHg PA: 56/16 mmHg. Mean PA 36 mmHg PW: 25 mmHg  CO: 5.4 L/min. CI 2.6 L/min/m2 PVR 2 WU  Impression: Elevated filling pressures Moderate WHO Grp II pulmonary hypertension Improvement in filling pressures with diuresis.  Echocardiogram 02/26/2018: Left ventricle cavity is normal in size. Mild concentric hypertrophy of the left ventricle. Abnormal septal wall motion due to post-operative valve. Diastolic function could not be assessed due to post op valve and CABG status.  Calculated EF 63%. Left atrial cavity is moderate to severely dilated measures 4.5 cm in long axis. Right atrial cavity is mildly dilated. Mechanical aortic valve with trace regurgitation. Mild aortic valve leaflet calcification. Mildly restricted aortic valve leaflets. Mild to moderate aortic valve stenosis. Aortic valve peak pressure gradient of  43 and mean gradient of 21 mmHg, calculated aortic valve area    0.88 cm. Mechanical mitral valve with trace regurgitation. Moderately restricted mitral valve leaflets. Mild mitral valve stenosis. Mitral valve peak pressure gradient of  26  and mean gradient of  6.2  mmHg, calculated mitral valve area 1.9   cm. Mild to moderate tricuspid regurgitation. Mild pulmonary  hypertension. PA systolic pressure estimated at 39 mm Hg. Compared to the study done on 08/06/2017, no significant change.  [facility]E 07/21/2018:  1. The left ventricle has normal systolic function, with an ejection fraction of 55-60%. There is abnormal septal motion consistent with post-operative status.  2. The right ventricle has normal systolc function.  3. A 25 mm Regent mechanical valve is present in the mitral position. Procedure Date: 2018 Echo findings are consistent with is functioning normally the mitral prosthesis. Normal mitral valve prosthesis. Mean PG 6 mmHg, MVA 1.7 cm2. No thrombus seen.  (Patient known to have subtherapeutic INR).  4. A 27mm [employer] mechanical prosthesis valve is present in the aortic position. Procedure Date: 2018 Normal aortic valve prosthesis. Echo findings shows no evidence of Accelration time of 88 msec suggests normal functioning valve. Mean PG 25 mmHg  likely due to small annular diameter. No prosthetic valve stenosis noted. of the aortic prosthesis. No thrombus seen. (Patient known to have subtherapeutic INR).  Recent labs:  Labs 02/16/2018: BNP 251 elevated  Labs 07/02/2017: A1c 5.7%, HB 12.9/HCT 38.6, platelets 220, normal indicis.  Serum glucose 90 mg, BUN 23, creatinine 0.75, CMP normal.  Potassium 4.6.  TSH normal.  Review of Systems  Constitutional: Positive for malaise/fatigue. Negative for weight gain.  Cardiovascular: Positive for dyspnea on exertion. Negative for chest pain, claudication, leg swelling, near-syncope, orthopnea, palpitations, paroxysmal nocturnal dyspnea and syncope.  Respiratory: Negative for shortness of breath.   Hematologic/Lymphatic: Does not bruise/bleed easily.  Gastrointestinal: Negative for melena.  Neurological: Negative for dizziness and weakness.         Vitals:   04/10/20 0931  BP: (!) 113/51  Pulse: (!) 56  Temp: (!) 97.2 F (36.2 C)  SpO2: 98%    Objective:     Physical Exam Vitals and nursing note  reviewed.  Constitutional:      General: She is not in acute distress. HENT:     Head: Normocephalic and atraumatic.  Neck:     Vascular: No JVD.  Cardiovascular:     Rate and Rhythm: Tachycardia present. Rhythm irregularly irregular.     Pulses: Intact distal pulses.     Heart sounds: No murmur heard.  Harsh midsystolic murmur is present at the upper right sternal border radiating to the neck. Crisp metallic S1 & S2. No diastolic murmur heard.   No gallop.   Pulmonary:     Effort: Pulmonary effort is normal. No respiratory distress.     Breath sounds: No wheezing, rhonchi or rales.  Musculoskeletal:     Right lower leg: No edema.     Left lower leg: No edema.  Lymphadenopathy:     Cervical: No cervical adenopathy.  Neurological:     Mental Status: She is alert.     Cranial Nerves: No cranial nerve deficit.          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  Paroxysmal Afib: EKG today reveals atrial fibrillation with rapid ventricular response.  Patient does report mild symptoms of dyspnea and fatigue on exertion.   Will increase diltiazem from 180 mg to 360 mg daily. Advised patient to decrease back to 180 mg daily if she feels she converts to sinus or if increased dose of diltiazem is not tolerable.  Counseled patient regarding signs and symptoms that would warrant urgent or emergent evaluation, she verbalized understanding and agreement.  Patient will return to the office in 1 week for repeat EKG.  If patient fails to spontaneously convert back to sinus rhythm by this time, we will plan to schedule for cardioversion. High CHA2DS2VAsc score. Continue warfarin, as below.  This patients CHA2DS2-VASc Score 4 (Female, HTN, DM, Vascular  disease hx) and yearly risk of stroke 4.8%.   Long-term Anticoagulation Goal: 2.5-3.5 Indication: PAF, MVR, AVR Most recent INR (04/06/20): 3.0 Current dose:12.5 mg Tues/Thurs, 7.5 mg all other days  New dose: N/A Next INR check: 2 weeks  CAD of native and bypass grafts without angina,  Patent grafts with non-critical disease.(07/2018) Continue medical management with Aspirin, Imdur 60 mg daiy, losartan 50 mg daily, metoprolol tartarate 100 mg bid, crestor 40 mg daily. Remains asymptomatic, without recurrence of angina.   S/p MVR, AVR: Continue warfarin  Hypertension: Controlled.   F/u in 1 week for repeat EKG.   Patient was seen  in collaboration with Dr. Virgina Jock and he is in agreement with the plan.     Alethia Berthold, PA-C 04/10/2020, 12:12 PM Office: [phone]

## 2020-04-17 ENCOUNTER — Other Ambulatory Visit: Payer: Self-pay

## 2020-04-17 ENCOUNTER — Ambulatory Visit: Payer: 59 | Admitting: Student

## 2020-04-17 DIAGNOSIS — I4891 Unspecified atrial fibrillation: Secondary | ICD-10-CM

## 2020-04-17 DIAGNOSIS — Z952 Presence of prosthetic heart valve: Secondary | ICD-10-CM

## 2020-04-17 DIAGNOSIS — Z5181 Encounter for therapeutic drug level monitoring: Secondary | ICD-10-CM

## 2020-04-17 LAB — POCT INR: INR: 3.5 — AB (ref 2.0–3.0)

## 2020-04-17 NOTE — Patient Instructions (Signed)
INR at goal. Continue taking 12.5 mg every Tue, Thurs and 7.5 mg all other days. Recheck INR in 2 weeks

## 2020-04-17 NOTE — Progress Notes (Signed)
Patient is no longer in atrial fibrillation, she is spontaneously converted to sinus rhythm.  Patient is feeling well overall denies chest pain, dyspnea, palpitations, syncope, near syncope, dizziness, lightheadedness.  Will not schedule for cardioversion at this time, will not make changes to patient's medications at this time.

## 2020-04-27 ENCOUNTER — Other Ambulatory Visit: Payer: Self-pay | Admitting: Cardiology

## 2020-05-01 ENCOUNTER — Other Ambulatory Visit: Payer: Self-pay

## 2020-05-01 ENCOUNTER — Ambulatory Visit: Payer: 59 | Admitting: Pharmacist

## 2020-05-01 DIAGNOSIS — Z7901 Long term (current) use of anticoagulants: Secondary | ICD-10-CM

## 2020-05-01 DIAGNOSIS — Z5181 Encounter for therapeutic drug level monitoring: Secondary | ICD-10-CM

## 2020-05-01 DIAGNOSIS — Z952 Presence of prosthetic heart valve: Secondary | ICD-10-CM

## 2020-05-01 LAB — POCT INR: INR: 4.2 — AB (ref 2.0–3.0)

## 2020-05-01 NOTE — Progress Notes (Addendum)
Anticoagulation Management Kimberly Hobbs is a 60 y.o. female who reports to the clinic for monitoring of warfarin treatment.    Indication: atrial fibrillation and Hx of Mechanical mitral and aortic valve replacement ; CHA2DS2 Vasc Score 4 (Female, HTN, DM, Vascular disease hx), HAS-BLED 1 (ASA, prior bleed hx (hematuria, hematochezia) Duration: indefinite Supervising physician: [facility] Visit History:  Patient does not report signs/symptoms of bleeding or thromboembolism   Other recent changes: No changes in diet, medications, lifestyle.   Pt maintaining steady Vit-K intake of avocado toast 1x/week, salad 4/week.  Continues to have mild wrist pain and back pain. Maybe considering surgery in the future, but hasnt had any surgery date scheduled as of yet. Aware to notify the coumadin clinic if surgery is scheduled.   Pt had a recent OV w/ PCP on 03/27/20 w/ CC of blood in bowel movement. Per PCP OV notes, pt had an episodes of bright red color stool without any noted indication of hemorrhoids or anal fissure. INR noted to be 3.7 during OV Pt did note that she recently had a recent UTI and was started on Macrobid. Called pt on 03/28/20 to review OV notes with pt. Pt was told to hold warfarin for 2 days in setting of potential active bleed. Bleeding episodes have since resolved with no noted recurrence. Pt has an GI referral in place and is scheduled for initial consultation on April 8th. Denies any recurrent bleeding episodes since.   Bleeding episodes have resolved since last check. Denies any further episodes of blood with bowel movement. Pt still planing on keeping the GI appt to review risk factors.   Anticoagulation Episode Summary    Current INR goal:  2.5-3.5  TTR:  64.0 % (2.1 y)  Next INR check:  05/15/2020  INR from last check:  4.2 (05/01/2020)  Weekly max warfarin dose:    Target end date:  Indefinite  INR check location:    Preferred lab:    Send  INR reminders to:     Indications   H/O mitral valve replacement with mechanical valve [Z95.2] Monitoring for long-term anticoagulant use [Z51.81 Z79.01]       Comments:          Allergies  Allergen Reactions  . Iodinated Diagnostic Agents Other (See Comments)    Patient is unsure of reaction type  . Penicillins Other (See Comments)    Immune to drug , does not work per patient  Did it involve swelling of the face/tongue/throat, SOB, or low BP? No Did it involve sudden or severe rash/hives, skin peeling, or any reaction on the inside of your mouth or nose? No Did you need to seek medical attention at a hospital or doctor's office? No When did it last happen?NOT A TRUE ALLERGY If all above answers are "NO", may proceed with cephalosporin use.     Current Outpatient Medications:  .  acetaminophen (TYLENOL) 500 MG tablet, Take 500 mg by mouth every 6 (six) hours as needed (for pain.)., Disp: , Rfl:  .  albuterol (VENTOLIN HFA) 108 (90 Base) MCG/ACT inhaler, Inhale 2 puffs into the lungs every 6 (six) hours as needed for wheezing or shortness of breath. , Disp: , Rfl:  .  aspirin EC 81 MG tablet, Take 81 mg by mouth daily. , Disp: , Rfl:  .  citalopram (CELEXA) 40 MG tablet, Take 20 mg by mouth 2 (two) times a day., Disp: , Rfl:  .  clonazePAM (KLONOPIN) 0.5  MG tablet, Take 0.5 mg by mouth at bedtime. , Disp: , Rfl:  .  diltiazem (CARDIZEM CD) 180 MG 24 hr capsule, TAKE 1 CAPSULE(180 MG) BY MOUTH DAILY, Disp: 90 capsule, Rfl: 2 .  furosemide (LASIX) 20 MG tablet, TAKE 1 TABLET BY MOUTH DAILY, Disp: 90 tablet, Rfl: 2 .  isosorbide mononitrate (IMDUR) 60 MG 24 hr tablet, TAKE 1 TABLET BY MOUTH DAILY, Disp: 90 tablet, Rfl: 3 .  Lancets (ONETOUCH DELICA PLUS [id number]) MISC, TAKE AS DIRECTED ONCE DAILY., Disp: , Rfl:  .  levalbuterol (XOPENEX HFA) 45 MCG/ACT inhaler, Inhale 1-2 puffs into the lungs every 6 (six) hours as needed for shortness of breath., Disp: 1 Inhaler, Rfl: 12 .   losartan (COZAAR) 50 MG tablet, TAKE 1 TABLET BY MOUTH DAILY, Disp: 30 tablet, Rfl: 6 .  metFORMIN (GLUCOPHAGE) 500 MG tablet, Take 500 mg by mouth 2 (two) times daily., Disp: , Rfl:  .  metoprolol tartrate (LOPRESSOR) 100 MG tablet, TAKE 1 TABLET(100 MG) BY MOUTH TWICE DAILY, Disp: 180 tablet, Rfl: 3 .  nitroGLYCERIN (NITROSTAT) 0.4 MG SL tablet, Place 1 tablet (0.4 mg total) under the tongue every 5 (five) minutes x 3 doses as needed for chest pain., Disp: 25 tablet, Rfl: 3 .  ONETOUCH VERIO test strip, AS DIRECTED ONCE A DAY, Disp: , Rfl:  .  pantoprazole (PROTONIX) 40 MG tablet, TAKE 1 TABLET(40 MG) BY MOUTH DAILY, Disp: 90 tablet, Rfl: 1 .  rosuvastatin (CRESTOR) 40 MG tablet, Take 1 tablet (40 mg total) by mouth daily., Disp: 90 tablet, Rfl: 1 .  spironolactone (ALDACTONE) 50 MG tablet, TAKE 1 TABLET(50 MG) BY MOUTH DAILY, Disp: 30 tablet, Rfl: 6 .  STIOLTO RESPIMAT 2.5-2.5 MCG/ACT AERS, INHALE 2 PUFFS INTO THE LUNGS DAILY, Disp: 4 g, Rfl: 5 .  warfarin (COUMADIN) 5 MG tablet, Take 1 tablet (5 mg total) by mouth daily. Refer to most recent anticoagulation note for most accurate information., Disp: 90 tablet, Rfl: 2 .  warfarin (COUMADIN) 7.5 MG tablet, TAKE 1 TABLET BY MOUTH EVERY EVENING, Disp: 90 tablet, Rfl: 2 Past Medical History:  Diagnosis Date  . COVID-19   . Hyperlipidemia   . Hypertension   . Rheumatic heart disease    MS/ AS    ASSESSMENT  Recent Results: The most recent result is correlated with 62.5 mg per week:  Lab Results  Component Value Date   INR 4.2 (A) 05/01/2020   INR 3.5 (A) 04/17/2020   INR 3.0 04/06/2020    Anticoagulation Dosing: Description   INR above goal. Decrease weekly dose to 7.5 mg every day. Recheck INR in 2 weeks     INR today: Supratherapeutic.INr continues to trend up. Pt denies any relevant changes that would explain the INR trending up. Reviewed EKG monitoring on pt's apple watch. No further GI bleedings complains. No other relevant  changes in her diet, medications, or lifestyle. Will decrease weekly dose and close monitoring to ensure INR continues to returns to therapeutic range. GI follow up to review GI bleed concerns   PLAN Weekly dose was decreased by 16% to 52.5 mg/week. Decrease weekly dose to 7.5 mg everyday. Recheck in 2 weeks   Patient Instructions  INR above goal. Decrease weekly dose to 7.5 mg every day. Recheck INR in 2 weeks  Patient advised to contact clinic or seek medical attention if signs/symptoms of bleeding or thromboembolism occur.  Patient verbalized understanding by repeating back information and was advised to contact me if further medication-related  questions arise.   Follow-up Return in about 2 weeks (around 05/15/2020).  [name], PharmD  15 minutes spent face-to-face with the patient during the encounter. 50% of time spent on education, including signs/sx bleeding and clotting, as well as food and drug interactions with warfarin. 50% of time was spent on fingerprick POC INR sample collection,processing, results determination, and documentation

## 2020-05-02 NOTE — Patient Instructions (Addendum)
INR above goal. Decrease weekly dose to 7.5 mg every day. Recheck INR in 2 weeks

## 2020-05-05 ENCOUNTER — Other Ambulatory Visit: Payer: Self-pay | Admitting: Cardiology

## 2020-05-12 NOTE — Telephone Encounter (Signed)
error 

## 2020-05-15 ENCOUNTER — Other Ambulatory Visit: Payer: Self-pay

## 2020-05-15 ENCOUNTER — Ambulatory Visit: Payer: 59 | Admitting: Pharmacist

## 2020-05-15 DIAGNOSIS — Z7901 Long term (current) use of anticoagulants: Secondary | ICD-10-CM

## 2020-05-15 DIAGNOSIS — Z952 Presence of prosthetic heart valve: Secondary | ICD-10-CM

## 2020-05-15 DIAGNOSIS — Z5181 Encounter for therapeutic drug level monitoring: Secondary | ICD-10-CM

## 2020-05-15 LAB — POCT INR: INR: 3.2 — AB (ref 2.0–3.0)

## 2020-05-15 NOTE — Patient Instructions (Signed)
INR at goal. Continue current weekly dose of 7.5 mg every day. Recheck INR in 3 weeks

## 2020-05-15 NOTE — Progress Notes (Signed)
Anticoagulation Management Kimberly Hobbs is a 60 y.o. female who reports to the clinic for monitoring of warfarin treatment.    Indication: atrial fibrillation and Hx of Mechanical mitral and aortic valve replacement ; CHA2DS2 Vasc Score 4 (Female, HTN, DM, Vascular disease hx), HAS-BLED 1 (ASA, prior bleed hx (hematuria, hematochezia) Duration: indefinite Supervising physician: [facility] Visit History:  Patient does not report signs/symptoms of bleeding or thromboembolism   Other recent changes: No changes in diet, medications, lifestyle.   Pt maintaining steady Vit-K intake of avocado toast 1x/week, salad 4/week.  Continues to have mild wrist pain and back pain. Maybe considering surgery in the future, but hasnt had any surgery date scheduled as of yet. Aware to notify the coumadin clinic if surgery is scheduled.   Pt has an GI referral in place and is scheduled for initial consultation on April 8th for concerns of  hemorrhoids or anal fissure. Denies any recent bleeding episodes.   Anticoagulation Episode Summary    Current INR goal:  2.5-3.5  TTR:  63.4 % (2.1 y)  Next INR check:  06/05/2020  INR from last check:  3.2 (05/15/2020)  Weekly max warfarin dose:    Target end date:  Indefinite  INR check location:    Preferred lab:    Send INR reminders to:     Indications   H/O mitral valve replacement with mechanical valve [Z95.2] Monitoring for long-term anticoagulant use [Z51.81 Z79.01]       Comments:          Allergies  Allergen Reactions  . Iodinated Diagnostic Agents Other (See Comments)    Patient is unsure of reaction type  . Penicillins Other (See Comments)    Immune to drug , does not work per patient  Did it involve swelling of the face/tongue/throat, SOB, or low BP? No Did it involve sudden or severe rash/hives, skin peeling, or any reaction on the inside of your mouth or nose? No Did you need to seek medical attention at a  hospital or doctor's office? No When did it last happen?NOT A TRUE ALLERGY If all above answers are "NO", may proceed with cephalosporin use.     Current Outpatient Medications:  .  acetaminophen (TYLENOL) 500 MG tablet, Take 500 mg by mouth every 6 (six) hours as needed (for pain.)., Disp: , Rfl:  .  albuterol (VENTOLIN HFA) 108 (90 Base) MCG/ACT inhaler, Inhale 2 puffs into the lungs every 6 (six) hours as needed for wheezing or shortness of breath. , Disp: , Rfl:  .  aspirin EC 81 MG tablet, Take 81 mg by mouth daily. , Disp: , Rfl:  .  citalopram (CELEXA) 40 MG tablet, Take 20 mg by mouth 2 (two) times a day., Disp: , Rfl:  .  clonazePAM (KLONOPIN) 0.5 MG tablet, Take 0.5 mg by mouth at bedtime. , Disp: , Rfl:  .  diltiazem (CARDIZEM CD) 180 MG 24 hr capsule, TAKE 1 CAPSULE(180 MG) BY MOUTH DAILY, Disp: 90 capsule, Rfl: 2 .  furosemide (LASIX) 20 MG tablet, TAKE 1 TABLET BY MOUTH DAILY, Disp: 90 tablet, Rfl: 2 .  isosorbide mononitrate (IMDUR) 60 MG 24 hr tablet, TAKE 1 TABLET BY MOUTH DAILY, Disp: 90 tablet, Rfl: 3 .  Lancets (ONETOUCH DELICA PLUS [id number]) MISC, TAKE AS DIRECTED ONCE DAILY., Disp: , Rfl:  .  levalbuterol (XOPENEX HFA) 45 MCG/ACT inhaler, Inhale 1-2 puffs into the lungs every 6 (six) hours as needed for shortness of breath., Disp:  1 Inhaler, Rfl: 12 .  losartan (COZAAR) 50 MG tablet, TAKE 1 TABLET BY MOUTH DAILY, Disp: 30 tablet, Rfl: 6 .  metFORMIN (GLUCOPHAGE) 500 MG tablet, Take 500 mg by mouth 2 (two) times daily., Disp: , Rfl:  .  metoprolol tartrate (LOPRESSOR) 100 MG tablet, TAKE 1 TABLET(100 MG) BY MOUTH TWICE DAILY, Disp: 180 tablet, Rfl: 3 .  nitroGLYCERIN (NITROSTAT) 0.4 MG SL tablet, Place 1 tablet (0.4 mg total) under the tongue every 5 (five) minutes x 3 doses as needed for chest pain., Disp: 25 tablet, Rfl: 3 .  ONETOUCH VERIO test strip, AS DIRECTED ONCE A DAY, Disp: , Rfl:  .  pantoprazole (PROTONIX) 40 MG tablet, TAKE 1 TABLET(40 MG) BY MOUTH DAILY,  Disp: 90 tablet, Rfl: 1 .  rosuvastatin (CRESTOR) 40 MG tablet, Take 1 tablet (40 mg total) by mouth daily., Disp: 90 tablet, Rfl: 1 .  spironolactone (ALDACTONE) 50 MG tablet, TAKE 1 TABLET(50 MG) BY MOUTH DAILY, Disp: 30 tablet, Rfl: 6 .  STIOLTO RESPIMAT 2.5-2.5 MCG/ACT AERS, INHALE 2 PUFFS INTO THE LUNGS DAILY, Disp: 4 g, Rfl: 5 .  warfarin (COUMADIN) 5 MG tablet, Take 1 tablet (5 mg total) by mouth daily. Refer to most recent anticoagulation note for most accurate information. (Patient taking differently: Take 5 mg by mouth daily. 7.5 mg every day or as directed by coumadin clinic), Disp: 90 tablet, Rfl: 2 .  warfarin (COUMADIN) 7.5 MG tablet, TAKE 1 TABLET BY MOUTH EVERY EVENING (Patient taking differently: 7.5 mg every day or as directed by coumadin clinic), Disp: 90 tablet, Rfl: 2 Past Medical History:  Diagnosis Date  . COVID-19   . Hyperlipidemia   . Hypertension   . Rheumatic heart disease    MS/ AS    ASSESSMENT  Recent Results: The most recent result is correlated with 52.5 mg per week:  Lab Results  Component Value Date   INR 3.2 (A) 05/15/2020   INR 4.2 (A) 05/01/2020   INR 3.5 (A) 04/17/2020    Anticoagulation Dosing: Description   INR at goal. Continue current weekly dose of 7.5 mg every day. Recheck INR in 3 weeks     INR today: Therapeutic. Following recent weekly dose decrease. Pt denies any relevant changes. No further GI bleedings complains. No other relevant changes in her diet, medications, or lifestyle. Will continue current weekly dose and close monitoring to ensure INR remains therapeutic range.   PLAN Weekly dose was unchanged by 0% to 52.5 mg/week. Continue current weekly dose of 7.5 mg everyday. Recheck in 3 weeks   Patient Instructions  INR at goal. Continue current weekly dose of 7.5 mg every day. Recheck INR in 3 weeks  Patient advised to contact clinic or seek medical attention if signs/symptoms of bleeding or thromboembolism occur.  Patient  verbalized understanding by repeating back information and was advised to contact me if further medication-related questions arise.   Follow-up Return in about 3 weeks (around 06/05/2020).  [name], PharmD  15 minutes spent face-to-face with the patient during the encounter. 50% of time spent on education, including signs/sx bleeding and clotting, as well as food and drug interactions with warfarin. 50% of time was spent on fingerprick POC INR sample collection,processing, results determination, and documentation

## 2020-05-21 ENCOUNTER — Telehealth: Payer: Self-pay | Admitting: Student

## 2020-05-21 DIAGNOSIS — I480 Paroxysmal atrial fibrillation: Secondary | ICD-10-CM

## 2020-05-21 NOTE — Telephone Encounter (Signed)
ON-CALL CARDIOLOGY 05/20/2020 7:30am  Patient's name: Kimberly Hobbs.   MRN: [id number].    DOB: 1961-01-22 Primary care provider: Lennie Odor, PA. Primary cardiologist: Dr. Virgina Jock   Interaction regarding this patient's care today: Patient with known history of paroxysmal atrial fibrillation. Called to report her heart rate is elevated and she is having occasional palpitations. States she feels she is back in atrial fibrillation. With the exception of intermittent palpitations, patient is asymptomatic. Denies chest pain, dizziness syncope, near syncope, dyspnea.   Impression:   ICD-10-CM   1. Paroxysmal atrial fibrillation (HCC)  I48.0     No orders of the defined types were placed in this encounter.   No orders of the defined types were placed in this encounter.   Recommendations: Advised patient to increase diltiazem from 180 mg to 360 mg daily until she feels she is back in sinus rhythm. Patient will call the office Monday to schedule an appointment if she feels she is still in atrial fibrillation. Counseled patient regarding signs and symptoms that would warrant emergent evaluation, she verbalized understanding and agreement.   Telephone encounter total time: 9 minutes    Alethia Berthold, PA-C 05/21/2020, 9:48 PM Office: [phone]

## 2020-05-22 ENCOUNTER — Ambulatory Visit: Payer: 59 | Admitting: Cardiology

## 2020-05-22 ENCOUNTER — Encounter: Payer: Self-pay | Admitting: Cardiology

## 2020-05-22 ENCOUNTER — Other Ambulatory Visit: Payer: Self-pay

## 2020-05-22 VITALS — BP 104/60 | HR 62 | Temp 98.0°F | Resp 97 | Ht 65.0 in | Wt 209.0 lb

## 2020-05-22 DIAGNOSIS — I480 Paroxysmal atrial fibrillation: Secondary | ICD-10-CM

## 2020-05-22 DIAGNOSIS — Z952 Presence of prosthetic heart valve: Secondary | ICD-10-CM

## 2020-05-22 DIAGNOSIS — Z5181 Encounter for therapeutic drug level monitoring: Secondary | ICD-10-CM

## 2020-05-22 DIAGNOSIS — Z7901 Long term (current) use of anticoagulants: Secondary | ICD-10-CM

## 2020-05-22 LAB — POCT INR: INR: 2.3 (ref 2.0–3.0)

## 2020-05-22 MED ORDER — AMIODARONE HCL 400 MG PO TABS: 400.0000 mg | ORAL_TABLET | Freq: Every day | ORAL | 2 refills | Status: DC

## 2020-05-22 MED ORDER — DILTIAZEM HCL ER COATED BEADS 180 MG PO CP24: ORAL_CAPSULE | ORAL | 0 refills | Status: DC

## 2020-05-22 NOTE — Patient Instructions (Signed)
INR below goal. Decrease weekly dose to 2.5 mg every Wed/Fri and 5 mg all other days. Recheck INR in 1 week.

## 2020-05-22 NOTE — Progress Notes (Signed)
Subjective:   Kimberly Hobbs, female    DOB: Feb 21, 1960, 59 y.o.   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  Today with complaints of rapid heart rate, and made today's emergent visit.  She notices when her heart is racing fast, generally noted.  This is associated with complaints of shortness of breath.  Current Outpatient Medications on File Prior to Visit  Medication Sig Dispense Refill  . acetaminophen (TYLENOL) 500 MG tablet Take 500 mg by mouth every 6 (six) hours as needed (for pain.).    Marland Kitchen albuterol (VENTOLIN HFA) 108 (90 Base) MCG/ACT inhaler Inhale 2 puffs into the lungs every 6 (six) hours as needed for wheezing or shortness of breath.     Marland Kitchen aspirin EC 81 MG tablet Take 81 mg by mouth daily.     . citalopram (CELEXA) 40 MG tablet Take 20 mg by mouth 2 (two) times a day.    . clonazePAM (KLONOPIN) 0.5 MG tablet Take 0.5 mg by mouth at bedtime.     Marland Kitchen diltiazem (CARDIZEM CD) 180 MG 24 hr capsule TAKE 1 CAPSULE(180 MG) BY MOUTH DAILY (Patient taking differently: Take 360 mg by mouth daily.) 90 capsule 2  . furosemide (LASIX) 20 MG tablet TAKE 1 TABLET BY MOUTH DAILY 90 tablet 2  . HYDROcodone-acetaminophen (NORCO/VICODIN) 5-325 MG tablet Take 1 tablet by mouth as needed.    . isosorbide mononitrate (IMDUR) 60 MG 24 hr tablet TAKE 1 TABLET BY MOUTH DAILY 90 tablet 3  . Lancets (ONETOUCH DELICA PLUS [id number]) MISC TAKE AS DIRECTED ONCE DAILY.    Marland Kitchen levalbuterol (XOPENEX HFA) 45 MCG/ACT inhaler Inhale 1-2 puffs into the lungs every 6 (six) hours as needed for shortness of breath. 1 Inhaler 12  . losartan (COZAAR) 50 MG tablet TAKE 1 TABLET BY MOUTH DAILY 30 tablet 6  . metFORMIN (GLUCOPHAGE) 500 MG tablet Take 500 mg  by mouth 2 (two) times daily.    . metoprolol tartrate (LOPRESSOR) 100 MG tablet TAKE 1 TABLET(100 MG) BY MOUTH TWICE DAILY 180 tablet 3  . nitroGLYCERIN (NITROSTAT) 0.4 MG SL tablet Place 1 tablet (0.4 mg total) under the tongue every 5 (five) minutes x 3 doses as needed for chest pain. 25 tablet 3  . ONETOUCH VERIO test strip AS DIRECTED ONCE A DAY    . pantoprazole (PROTONIX) 40 MG tablet TAKE 1 TABLET(40 MG) BY MOUTH DAILY 90 tablet 1  . rosuvastatin (CRESTOR) 40 MG tablet Take 1 tablet (40 mg total) by mouth daily. 90 tablet 1  . spironolactone (ALDACTONE) 50 MG tablet TAKE 1 TABLET(50 MG) BY MOUTH DAILY 30 tablet 6  . STIOLTO RESPIMAT 2.5-2.5 MCG/ACT AERS INHALE 2 PUFFS INTO THE LUNGS DAILY 4 g 5  . tiZANidine (ZANAFLEX) 4 MG tablet Take 4 mg by mouth as needed.    . warfarin (COUMADIN) 5 MG tablet Take 1 tablet (5 mg total) by mouth daily. Refer to most recent anticoagulation note for most accurate information. (Patient taking differently: Take 5 mg by mouth daily. 7.5 mg every day or as directed by coumadin clinic) 90 tablet 2  . warfarin (COUMADIN) 7.5 MG tablet TAKE 1 TABLET BY MOUTH EVERY EVENING (Patient taking differently: 7.5 mg every day or as directed by coumadin clinic) 90  tablet 2   No current facility-administered medications on file prior to visit.    Cardiovascular studies:  EKG 05/22/2020: Atrial fibrillation 126 bpm  Left anterior fascicular block  Old anteroseptal infarct Poor R wave progression   EKG 12/06/2019: Atrial fibrillation 11 bpm  Possible old anteroseptal infarct Nonspecific ST-T changes  Coronary and bypass graft angiography 07/21/2018 and 08/18/2018: LM: Normal LAD: 100% mid occlusion. Mild distal disease LIMA-LAD: Patent LCx: Normal RCA: Prox 60% stenosis, mid 30-40% stenoses. TIMI III flow in prox-mid RCA. 100% distal occlusion SVG-RCA: Patent. Ostial 40%. Distal RCA moderate diffuse disease.   Guide catheter angiography provided superior  images. There is TIMI III flow in SVG which fills RCA all the way to the ostium. Even if she were to have FFR positive lesion in the graft, I do not think the benefits of a stent would outweigh the risk of losing the graft and the entire RCA territory circulation in a borderline lesion. Also, the prox RCA lesion is well bypassed by the SVG graft. Thus, I decided not to perform FFR/PCI to either of these lesions. Continue medical management.   [place] 07/21/2018: #1: RA: 18 mmHg RV 90/12 mmHg PA: 94/40 mmHg. Mean PA 63 mmHg. PW 33 mmHg  Lasix 80 mg  #2: RA 19 mmHg RV 60/0 mmHg PA: 56/16 mmHg. Mean PA 36 mmHg PW: 25 mmHg  CO: 5.4 L/min. CI 2.6 L/min/m2 PVR 2 WU  Impression: Elevated filling pressures Moderate WHO Grp II pulmonary hypertension Improvement in filling pressures with diuresis.  Echocardiogram 02/26/2018: Left ventricle cavity is normal in size. Mild concentric hypertrophy of the left ventricle. Abnormal septal wall motion due to post-operative valve. Diastolic function could not be assessed due to post op valve and CABG status.  Calculated EF 63%. Left atrial cavity is moderate to severely dilated measures 4.5 cm in long axis. Right atrial cavity is mildly dilated. Mechanical aortic valve with trace regurgitation. Mild aortic valve leaflet calcification. Mildly restricted aortic valve leaflets. Mild to moderate aortic valve stenosis. Aortic valve peak pressure gradient of  43 and mean gradient of 21 mmHg, calculated aortic valve area    0.88 cm. Mechanical mitral valve with trace regurgitation. Moderately restricted mitral valve leaflets. Mild mitral valve stenosis. Mitral valve peak pressure gradient of  26  and mean gradient of  6.2  mmHg, calculated mitral valve area 1.9   cm. Mild to moderate tricuspid regurgitation. Mild pulmonary hypertension. PA systolic pressure estimated at 39 mm Hg. Compared to the study done on 08/06/2017, no significant change.  TEE 07/21/2018:   1. The left ventricle has normal systolic function, with an ejection fraction of 55-60%. There is abnormal septal motion consistent with post-operative status.  2. The right ventricle has normal systolc function.  3. A 25 mm Regent mechanical valve is present in the mitral position. Procedure Date: 2018 Echo findings are consistent with is functioning normally the mitral prosthesis. Normal mitral valve prosthesis. Mean PG 6 mmHg, MVA 1.7 cm2. No thrombus seen.  (Patient known to have subtherapeutic INR).  4. A 17mm [employer] mechanical prosthesis valve is present in the aortic position. Procedure Date: 2018 Normal aortic valve prosthesis. Echo findings shows no evidence of Accelration time of 88 msec suggests normal functioning valve. Mean PG 25 mmHg  likely due to small annular diameter. No prosthetic valve stenosis noted. of the aortic prosthesis. No thrombus seen. (Patient known to have subtherapeutic INR).  Recent labs: Labs 02/16/2018: BNP 251 elevated  Labs 07/02/2017: A1c  5.7%, HB 12.9/HCT 38.6, platelets 220, normal indicis.  Serum glucose 90 mg, BUN 23, creatinine 0.75, CMP normal.  Potassium 4.6.  TSH normal.  Review of Systems  Cardiovascular: Positive for dyspnea on exertion and palpitations. Negative for chest pain, leg swelling and syncope.  Musculoskeletal: Positive for joint pain.       Right forearm/elbow pain         Vitals:   05/22/20 1014 05/22/20 1016  BP: (!) 73/58 104/60  Pulse: 78 62  Resp: (!) 97   Temp: 98 F (36.7 C)   SpO2:  97%    Objective:     Physical Exam Vitals and nursing note reviewed.  Constitutional:      General: She is not in acute distress. Neck:     Vascular: No JVD.  Cardiovascular:     Rate and Rhythm: Tachycardia present. Rhythm irregular.     Heart sounds: Normal heart sounds. No murmur heard.     Comments: Metallic S1, S2 Pulmonary:     Effort: Pulmonary effort is normal.     Breath sounds: Normal breath sounds. No wheezing  or rales.          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  Paroxysmal Afib: Currently in A. fib with RVR.   He has been increase in frequency and severity of her A. fib episodes.   She will benefit from rhythm control therapy.  Given her prior history of mitral and aortic valve replacement for rheumatic disease, ablation is less likely to be an attractive option in the setting of left atrial dilatation.  Given her structural abnormalities, no choices for antiarrhythmic therapy or amiodarone and Tikosyn.  After discussing risks, its started on amiodarone.  We will check baseline TSH, CMP.  Recommend amiodarone 400 mg 3 times daily for 1 week. Follow-up visit in 1 week for EKG and INR check. If back in sinus rhythm, continue amiodarone at reduced dose of 200 mg twice daily for 1 week, followed by 200 mg once daily. If still in A. fib, but rate controlled, proceed with echocardiogram as scheduled and arrange for outpatient cardioversion. If still in A. fib with uncontrolled ventricular rate, >120 bpm, old echocardiogram and arrange for outpatient cardioversion. Still in A. fib with uncontrolled ventricular rate, >140 bpm, consider hospital admission for rate control/cardioversion. High CHA2DS2VAsc score. Continue warfarin, as below.    Goal: 2.5-3.5 Indication: PAF, MVR, AVR Today's INR: 2.4 Current dose:7.5 mg daily  New dose: 2.5 mg Monday, Friday, 5 mg all other days. (Change made owing to initiation of amiodarone) Next INR check: 1 week  CAD of native and bypass grafts without angina,  Patent grafts with non-critical disease.(07/2018) Continue medical management with Aspirin, Imdur 60 mg daiy, losartan 50 mg daily, metoprolol tartarate 100  mg bid, crestor 40 mg daily.  S/p MVR, AVR: Continue warfarin  Hypertension: Controlled.   F/u in 1 week w/Celeste Cantwell, PA-C   Nigel Mormon, MD [facility] Pager: [phone] Office: [phone] If no answer Cell [phone]

## 2020-05-23 LAB — COMPREHENSIVE METABOLIC PANEL
ALT: 26 IU/L (ref 0–32)
AST: 24 IU/L (ref 0–40)
Albumin/Globulin Ratio: 1.6 (ref 1.2–2.2)
Albumin: 4.6 g/dL (ref 3.8–4.9)
Alkaline Phosphatase: 113 IU/L (ref 44–121)
BUN/Creatinine Ratio: 23 (ref 9–23)
BUN: 25 mg/dL — ABNORMAL HIGH (ref 6–24)
Bilirubin Total: 0.4 mg/dL (ref 0.0–1.2)
CO2: 19 mmol/L — ABNORMAL LOW (ref 20–29)
Calcium: 9.5 mg/dL (ref 8.7–10.2)
Chloride: 100 mmol/L (ref 96–106)
Creatinine, Ser: 1.09 mg/dL — ABNORMAL HIGH (ref 0.57–1.00)
Globulin, Total: 2.8 g/dL (ref 1.5–4.5)
Glucose: 129 mg/dL — ABNORMAL HIGH (ref 65–99)
Potassium: 4.7 mmol/L (ref 3.5–5.2)
Sodium: 136 mmol/L (ref 134–144)
Total Protein: 7.4 g/dL (ref 6.0–8.5)
eGFR: 59 mL/min/{1.73_m2} — ABNORMAL LOW (ref >59)

## 2020-05-23 LAB — TSH: TSH: 1.17 u[IU]/mL (ref 0.450–4.500)

## 2020-05-24 ENCOUNTER — Telehealth: Payer: Self-pay

## 2020-05-24 NOTE — Telephone Encounter (Signed)
Yes. Will reduce it after 1 week

## 2020-05-24 NOTE — Telephone Encounter (Signed)
Patient called and said that she is now out of AFib and wants to know what she should do about it.

## 2020-05-24 NOTE — Telephone Encounter (Signed)
Good. Just keep the f/u appt on 4/11. Will not need cardioversion

## 2020-05-29 ENCOUNTER — Encounter: Payer: Self-pay | Admitting: Student

## 2020-05-29 ENCOUNTER — Ambulatory Visit: Payer: 59 | Admitting: Student

## 2020-05-29 ENCOUNTER — Other Ambulatory Visit: Payer: Self-pay

## 2020-05-29 VITALS — BP 112/49 | HR 55 | Temp 98.1°F | Resp 17 | Ht 65.0 in | Wt 210.0 lb

## 2020-05-29 DIAGNOSIS — I480 Paroxysmal atrial fibrillation: Secondary | ICD-10-CM

## 2020-05-29 DIAGNOSIS — Z5181 Encounter for therapeutic drug level monitoring: Secondary | ICD-10-CM

## 2020-05-29 DIAGNOSIS — I25708 Atherosclerosis of coronary artery bypass graft(s), unspecified, with other forms of angina pectoris: Secondary | ICD-10-CM

## 2020-05-29 DIAGNOSIS — Z952 Presence of prosthetic heart valve: Secondary | ICD-10-CM

## 2020-05-29 DIAGNOSIS — Z7901 Long term (current) use of anticoagulants: Secondary | ICD-10-CM

## 2020-05-29 LAB — POCT INR: INR: 2.1 (ref 2.0–3.0)

## 2020-05-29 MED ORDER — AMIODARONE HCL 200 MG PO TABS: 200.0000 mg | ORAL_TABLET | Freq: Every day | ORAL | 1 refills | Status: DC

## 2020-05-29 NOTE — Patient Instructions (Addendum)
INR below goal. Increase weekly dose to 2.5 mg every Fri and 5 mg all other days. Recheck INR in 1 week.  Take amiodarone 200 mg twice daily for 1 week.

## 2020-05-29 NOTE — Progress Notes (Signed)
Subjective:   Kimberly Hobbs, female    DOB: [birth year], 60 y.o.   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  Patient presented to the office 05/22/2020 with concern she was back in atrial fibrillation.  EKG revealed A. fib with RVR.  Patient was therefore started on amiodarone 400 mg 3 times daily for a week.  Patient now presents for follow-up and repeat INR check.   Patient also reports a fall approximately 6 days ago, which she thinks she may have hit her head.  Unfortunately she was not evaluated following this fall.  She denies symptoms suggestive of CVA.  Patient denies chest pain, palpitations, syncope, near syncope, dizziness.  Current Outpatient Medications on File Prior to Visit  Medication Sig Dispense Refill  . acetaminophen (TYLENOL) 500 MG tablet Take 500 mg by mouth every 6 (six) hours as needed (for pain.).    Marland Kitchen albuterol (VENTOLIN HFA) 108 (90 Base) MCG/ACT inhaler Inhale 2 puffs into the lungs every 6 (six) hours as needed for wheezing or shortness of breath.     Marland Kitchen aspirin EC 81 MG tablet Take 81 mg by mouth daily.     . citalopram (CELEXA) 40 MG tablet Take 20 mg by mouth 2 (two) times a day.    . clonazePAM (KLONOPIN) 0.5 MG tablet Take 0.5 mg by mouth at bedtime.     Marland Kitchen diltiazem (CARDIZEM CD) 180 MG 24 hr capsule TAKE 1 CAPSULE(180 MG) BY MOUTH DAILY 1 capsule 0  . furosemide (LASIX) 20 MG tablet TAKE 1 TABLET BY MOUTH DAILY 90 tablet 2  . HYDROcodone-acetaminophen (NORCO/VICODIN) 5-325 MG tablet Take 1 tablet by mouth as needed.    . isosorbide mononitrate (IMDUR) 60 MG 24 hr tablet TAKE 1 TABLET BY MOUTH DAILY 90 tablet 3  . Lancets (ONETOUCH DELICA PLUS [id number]) MISC TAKE AS DIRECTED ONCE DAILY.     Marland Kitchen levalbuterol (XOPENEX HFA) 45 MCG/ACT inhaler Inhale 1-2 puffs into the lungs every 6 (six) hours as needed for shortness of breath. 1 Inhaler 12  . losartan (COZAAR) 50 MG tablet TAKE 1 TABLET BY MOUTH DAILY 30 tablet 6  . metFORMIN (GLUCOPHAGE) 500 MG tablet Take 500 mg by mouth 2 (two) times daily.    . metoprolol tartrate (LOPRESSOR) 100 MG tablet TAKE 1 TABLET(100 MG) BY MOUTH TWICE DAILY 180 tablet 3  . nitroGLYCERIN (NITROSTAT) 0.4 MG SL tablet Place 1 tablet (0.4 mg total) under the tongue every 5 (five) minutes x 3 doses as needed for chest pain. 25 tablet 3  . ONETOUCH VERIO test strip AS DIRECTED ONCE A DAY    . pantoprazole (PROTONIX) 40 MG tablet TAKE 1 TABLET(40 MG) BY MOUTH DAILY 90 tablet 1  . rosuvastatin (CRESTOR) 40 MG tablet Take 1 tablet (40 mg total) by mouth daily. 90 tablet 1  . spironolactone (ALDACTONE) 50 MG tablet TAKE 1 TABLET(50 MG) BY MOUTH DAILY 30 tablet 6  . STIOLTO RESPIMAT 2.5-2.5 MCG/ACT AERS INHALE 2 PUFFS INTO THE LUNGS DAILY 4 g 5  . tiZANidine (ZANAFLEX) 4 MG tablet Take 4 mg by mouth as needed.    . warfarin (COUMADIN) 5 MG tablet Take 1 tablet (5 mg total) by mouth daily. Refer to most recent anticoagulation note for most accurate information. (  Patient taking differently: Take 5 mg by mouth daily. 7.5 mg every day or as directed by coumadin clinic) 90 tablet 2  . warfarin (COUMADIN) 7.5 MG tablet TAKE 1 TABLET BY MOUTH EVERY EVENING (Patient taking differently: 7.5 mg every day or as directed by coumadin clinic) 90 tablet 2   No current facility-administered medications on file prior to visit.    Cardiovascular studies:  EKG 05/29/2020: Sinus bradycardia rate of 52 bpm.  Left axis.  Poor R wave progression, cannot exclude anteroseptal infarct old.  EKG 05/22/2020: Atrial fibrillation 126 bpm  Left anterior fascicular block  Old anteroseptal infarct Poor R wave progression  EKG 12/06/2019: Atrial fibrillation 11 bpm  Possible old anteroseptal  infarct Nonspecific ST-T changes  Coronary and bypass graft angiography 07/21/2018 and 08/18/2018: LM: Normal LAD: 100% mid occlusion. Mild distal disease LIMA-LAD: Patent LCx: Normal RCA: Prox 60% stenosis, mid 30-40% stenoses. TIMI III flow in prox-mid RCA. 100% distal occlusion SVG-RCA: Patent. Ostial 40%. Distal RCA moderate diffuse disease.   Guide catheter angiography provided superior images. There is TIMI III flow in SVG which fills RCA all the way to the ostium. Even if she were to have FFR positive lesion in the graft, I do not think the benefits of a stent would outweigh the risk of losing the graft and the entire RCA territory circulation in a borderline lesion. Also, the prox RCA lesion is well bypassed by the SVG graft. Thus, I decided not to perform FFR/PCI to either of these lesions. Continue medical management.   [place] 07/21/2018: #1: RA: 18 mmHg RV 90/12 mmHg PA: 94/40 mmHg. Mean PA 63 mmHg. PW 33 mmHg  Lasix 80 mg  #2: RA 19 mmHg RV 60/0 mmHg PA: 56/16 mmHg. Mean PA 36 mmHg PW: 25 mmHg  CO: 5.4 L/min. CI 2.6 L/min/m2 PVR 2 WU  Impression: Elevated filling pressures Moderate WHO Grp II pulmonary hypertension Improvement in filling pressures with diuresis.  Echocardiogram 02/26/2018: Left ventricle cavity is normal in size. Mild concentric hypertrophy of the left ventricle. Abnormal septal wall motion due to post-operative valve. Diastolic function could not be assessed due to post op valve and CABG status.  Calculated EF 63%. Left atrial cavity is moderate to severely dilated measures 4.5 cm in long axis. Right atrial cavity is mildly dilated. Mechanical aortic valve with trace regurgitation. Mild aortic valve leaflet calcification. Mildly restricted aortic valve leaflets. Mild to moderate aortic valve stenosis. Aortic valve peak pressure gradient of  43 and mean gradient of 21 mmHg, calculated aortic valve area    0.88 cm. Mechanical mitral valve with trace  regurgitation. Moderately restricted mitral valve leaflets. Mild mitral valve stenosis. Mitral valve peak pressure gradient of  26  and mean gradient of  6.2  mmHg, calculated mitral valve area 1.9   cm. Mild to moderate tricuspid regurgitation. Mild pulmonary hypertension. PA systolic pressure estimated at 39 mm Hg. Compared to the study done on 08/06/2017, no significant change.  TEE 07/21/2018:  1. The left ventricle has normal systolic function, with an ejection fraction of 55-60%. There is abnormal septal motion consistent with post-operative status.  2. The right ventricle has normal systolc function.  3. A 25 mm Regent mechanical valve is present in the mitral position. Procedure Date: 2018 Echo findings are consistent with is functioning normally the mitral prosthesis. Normal mitral valve prosthesis. Mean PG 6 mmHg, MVA 1.7 cm2. No thrombus seen.  (Patient known to have subtherapeutic INR).  4. A 64m [employer] Jude mechanical  prosthesis valve is present in the aortic position. Procedure Date: 2018 Normal aortic valve prosthesis. Echo findings shows no evidence of Accelration time of 88 msec suggests normal functioning valve. Mean PG 25 mmHg  likely due to small annular diameter. No prosthetic valve stenosis noted. of the aortic prosthesis. No thrombus seen. (Patient known to have subtherapeutic INR).  Recent labs: 05/22/2020:  Glucose 129, BUN 25, creatinine 1.09, GFR 59, sodium 136, potassium 4.7, alk phos 113, AST 24, ALT 26 TSH 1.17  02/16/2018: BNP 251 elevated  07/02/2017: A1c 5.7%, HB 12.9/HCT 38.6, platelets 220, normal indicis.  Serum glucose 90 mg, BUN 23, creatinine 0.75, CMP normal.  Potassium 4.6.  TSH normal.  Review of Systems  Constitutional: Negative for malaise/fatigue and weight gain.  Cardiovascular: Negative for chest pain, claudication, dyspnea on exertion, leg swelling, near-syncope, orthopnea, palpitations, paroxysmal nocturnal dyspnea and syncope.  Respiratory:  Negative for shortness of breath.   Hematologic/Lymphatic: Does not bruise/bleed easily.  Musculoskeletal: Positive for joint pain.       Right forearm/elbow pain  Gastrointestinal: Negative for melena.  Neurological: Negative for dizziness and weakness.         Vitals:   05/29/20 1311  BP: (!) 112/49  Pulse: (!) 55  Resp: 17  Temp: 98.1 F (36.7 C)  SpO2: 97%    Objective:     Physical Exam Vitals and nursing note reviewed.  Constitutional:      General: She is not in acute distress. HENT:     Head: Normocephalic and atraumatic.  Neck:     Vascular: No JVD.  Cardiovascular:     Rate and Rhythm: Normal rate and regular rhythm.     Pulses: Intact distal pulses.     Heart sounds: Normal heart sounds, S1 normal and S2 normal. No murmur heard. No gallop.      Comments: Metallic S1, S2 Pulmonary:     Effort: Pulmonary effort is normal. No respiratory distress.     Breath sounds: Normal breath sounds. No wheezing, rhonchi or rales.  Musculoskeletal:     Right lower leg: No edema.     Left lower leg: No edema.  Neurological:     Mental Status: She is alert.       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  Patient presented to the office 05/22/2020 with concern she was back in atrial fibrillation.  EKG revealed A. fib with RVR.  Patient was therefore started on amiodarone 400 mg 3 times daily for a week.  Patient now presents for follow-up and repeat INR check.  Paroxysmal Afib: Currently in sinus rhythm.  Reduce amiodarone from 400 mg 3 times daily to 20 mg twice daily for 1 week.  Patient will follow up in our office for repeat EKG and INR check in 1 week.  If she maintains sinus rhythm will further reduce amiodarone to 20 mg  once daily. In regard to rhythm control options are limited for patient given her prior history of mitral and aortic valve replacement for rheumatic disease, ablation is less likely to be an attractive option in the setting of left atrial dilatation.  Given her structural abnormalities, choices for antiarrhythmic therapy are amiodarone and Tikosyn.  Again discussed with patient risks vs benefits, patient agrees to continue amiodarone.  High CHA2DS2VAsc score. Continue  warfarin, as below.    Goal: 2.5-3.5 Indication: PAF, MVR, AVR Today's INR: 2.1 Current dose:7.5 mg daily  New dose: 2.5 mg Friday, 5 mg all other days  Next INR check: 1 week  CAD of native and bypass grafts without angina,  Patent grafts with non-critical disease.(07/2018) Continue medical management with Aspirin, Imdur 60 mg daiy, losartan 50 mg daily, metoprolol tartarate 100 mg bid, crestor 40 mg daily.  S/p MVR, AVR: Continue warfarin  Hypertension: Controlled.  Follow up for repeat EKG and INR check in 1 week. Follow up for office appointment with Dr. Virgina Jock in May as previously scheduled.    Alethia Berthold, PA-C 05/29/2020, 5:18 PM Office: [phone]

## 2020-05-31 ENCOUNTER — Telehealth: Payer: Self-pay

## 2020-05-31 ENCOUNTER — Other Ambulatory Visit: Payer: Self-pay

## 2020-05-31 DIAGNOSIS — I10 Essential (primary) hypertension: Secondary | ICD-10-CM

## 2020-05-31 NOTE — Telephone Encounter (Signed)
Patient stated that she has always been able to feel when she go into A-fib and she is feeling it right now. She stated that she was on Amiodarone 400mg  3xday and it was recently decreased to 200mg  1xday. She is concerned that it may have been the medication adjustment causing her to go back into A-Fib.

## 2020-06-01 ENCOUNTER — Ambulatory Visit: Payer: 59

## 2020-06-01 ENCOUNTER — Other Ambulatory Visit: Payer: Self-pay

## 2020-06-01 ENCOUNTER — Ambulatory Visit: Payer: Self-pay | Admitting: Pharmacist

## 2020-06-01 DIAGNOSIS — Z5181 Encounter for therapeutic drug level monitoring: Secondary | ICD-10-CM

## 2020-06-01 DIAGNOSIS — Z952 Presence of prosthetic heart valve: Secondary | ICD-10-CM

## 2020-06-01 DIAGNOSIS — Z7901 Long term (current) use of anticoagulants: Secondary | ICD-10-CM

## 2020-06-01 DIAGNOSIS — I10 Essential (primary) hypertension: Secondary | ICD-10-CM

## 2020-06-01 LAB — POCT INR: INR: 1.6 — AB (ref 2.0–3.0)

## 2020-06-01 NOTE — Progress Notes (Signed)
Anticoagulation Management Kimberly Hobbs is a 60 y.o. female who reports to the clinic for monitoring of warfarin treatment.    Indication: atrial fibrillation and Hx of Mechanical mitral and aortic valve replacement ; CHA2DS2 Vasc Score 4 (Female, HTN, DM, Vascular disease hx), HAS-BLED 1 (ASA, prior bleed hx (hematuria, hematochezia) Duration: indefinite Supervising physician: [facility] Visit History:  Patient does not report signs/symptoms of bleeding or thromboembolism   Other recent changes: No changes in diet, medications, lifestyle.   Pt maintaining steady Vit-K intake of avocado toast 1x/week, salad 4/week.  Continues to have mild wrist pain and back pain. Maybe considering surgery in the future, but hasnt had any surgery date scheduled as of yet. Aware to notify the coumadin clinic if surgery is scheduled.   Recent amiodarone dose decreased from 400 mg TID to 200 mg daily last week. Amiodarone dose increased to 200 mg TID today.   Anticoagulation Episode Summary    Current INR goal:  2.5-3.5  TTR:  62.7 % (2.1 y)  Next INR check:  06/05/2020  INR from last check:  1.6 (06/01/2020)  Weekly max warfarin dose:    Target end date:  Indefinite  INR check location:    Preferred lab:    Send INR reminders to:     Indications   H/O mitral valve replacement with mechanical valve [Z95.2] Monitoring for long-term anticoagulant use [Z51.81 Z79.01]       Comments:          Allergies  Allergen Reactions  . Iodinated Diagnostic Agents Other (See Comments)    Patient is unsure of reaction type  . Penicillins Other (See Comments)    Immune to drug , does not work per patient  Did it involve swelling of the face/tongue/throat, SOB, or low BP? No Did it involve sudden or severe rash/hives, skin peeling, or any reaction on the inside of your mouth or nose? No Did you need to seek medical attention at a hospital or doctor's office? No When did it  last happen?NOT A TRUE ALLERGY If all above answers are "NO", may proceed with cephalosporin use.     Current Outpatient Medications:  .  acetaminophen (TYLENOL) 500 MG tablet, Take 500 mg by mouth every 6 (six) hours as needed (for pain.)., Disp: , Rfl:  .  albuterol (VENTOLIN HFA) 108 (90 Base) MCG/ACT inhaler, Inhale 2 puffs into the lungs every 6 (six) hours as needed for wheezing or shortness of breath. , Disp: , Rfl:  .  amiodarone (PACERONE) 200 MG tablet, Take 1 tablet (200 mg total) by mouth daily., Disp: 60 tablet, Rfl: 1 .  aspirin EC 81 MG tablet, Take 81 mg by mouth daily. , Disp: , Rfl:  .  citalopram (CELEXA) 40 MG tablet, Take 20 mg by mouth 2 (two) times a day., Disp: , Rfl:  .  clonazePAM (KLONOPIN) 0.5 MG tablet, Take 0.5 mg by mouth at bedtime. , Disp: , Rfl:  .  diltiazem (CARDIZEM CD) 180 MG 24 hr capsule, TAKE 1 CAPSULE(180 MG) BY MOUTH DAILY, Disp: 1 capsule, Rfl: 0 .  furosemide (LASIX) 20 MG tablet, TAKE 1 TABLET BY MOUTH DAILY, Disp: 90 tablet, Rfl: 2 .  HYDROcodone-acetaminophen (NORCO/VICODIN) 5-325 MG tablet, Take 1 tablet by mouth as needed., Disp: , Rfl:  .  isosorbide mononitrate (IMDUR) 60 MG 24 hr tablet, TAKE 1 TABLET BY MOUTH DAILY, Disp: 90 tablet, Rfl: 3 .  Lancets (ONETOUCH DELICA PLUS [id number]) MISC, TAKE AS  DIRECTED ONCE DAILY., Disp: , Rfl:  .  levalbuterol (XOPENEX HFA) 45 MCG/ACT inhaler, Inhale 1-2 puffs into the lungs every 6 (six) hours as needed for shortness of breath., Disp: 1 Inhaler, Rfl: 12 .  losartan (COZAAR) 50 MG tablet, TAKE 1 TABLET BY MOUTH DAILY, Disp: 30 tablet, Rfl: 6 .  metFORMIN (GLUCOPHAGE) 500 MG tablet, Take 500 mg by mouth 2 (two) times daily., Disp: , Rfl:  .  metoprolol tartrate (LOPRESSOR) 100 MG tablet, TAKE 1 TABLET(100 MG) BY MOUTH TWICE DAILY, Disp: 180 tablet, Rfl: 3 .  nitroGLYCERIN (NITROSTAT) 0.4 MG SL tablet, Place 1 tablet (0.4 mg total) under the tongue every 5 (five) minutes x 3 doses as needed for chest  pain., Disp: 25 tablet, Rfl: 3 .  ONETOUCH VERIO test strip, AS DIRECTED ONCE A DAY, Disp: , Rfl:  .  pantoprazole (PROTONIX) 40 MG tablet, TAKE 1 TABLET(40 MG) BY MOUTH DAILY, Disp: 90 tablet, Rfl: 1 .  rosuvastatin (CRESTOR) 40 MG tablet, Take 1 tablet (40 mg total) by mouth daily., Disp: 90 tablet, Rfl: 1 .  spironolactone (ALDACTONE) 50 MG tablet, TAKE 1 TABLET(50 MG) BY MOUTH DAILY, Disp: 30 tablet, Rfl: 6 .  STIOLTO RESPIMAT 2.5-2.5 MCG/ACT AERS, INHALE 2 PUFFS INTO THE LUNGS DAILY, Disp: 4 g, Rfl: 5 .  tiZANidine (ZANAFLEX) 4 MG tablet, Take 4 mg by mouth as needed., Disp: , Rfl:  .  warfarin (COUMADIN) 5 MG tablet, Take 1 tablet (5 mg total) by mouth daily. Refer to most recent anticoagulation note for most accurate information. (Patient taking differently: Take 5 mg by mouth daily. 7.5 mg every day or as directed by coumadin clinic), Disp: 90 tablet, Rfl: 2 .  warfarin (COUMADIN) 7.5 MG tablet, TAKE 1 TABLET BY MOUTH EVERY EVENING (Patient taking differently: 7.5 mg every day or as directed by coumadin clinic), Disp: 90 tablet, Rfl: 2 Past Medical History:  Diagnosis Date  . COVID-19   . Hyperlipidemia   . Hypertension   . Rheumatic heart disease    MS/ AS    ASSESSMENT  Recent Results: The most recent result is correlated with 32.5  mg per week:  Lab Results  Component Value Date   INR 1.6 (A) 06/01/2020   INR 2.1 05/29/2020   INR 2.3 05/22/2020    Anticoagulation Dosing: Description   INR below goal. Take 7.5 mg x 3 then 5 mg x 1. Recheck in 4 days.      INR today: Subtherapeutic. Recent Amiodarone dose changes contributing to labile warfarin management. Pt denies any relevant changes. No further GI bleedings complains. No other relevant changes in her diet, medications, or lifestyle. Will boost dose and continue close monitoring in setting of amiodarone dose increase.    PLAN Weekly dose was unchanged by 0% to 32.5 mg/week. Take 7.5 mg x 3 and 5 mg x 1. Recheck INR  in 4 days.   Patient Instructions  INR below goal. Take 7.5 mg x3 then 5 mg x1. Recheck in 4 days.   Patient advised to contact clinic or seek medical attention if signs/symptoms of bleeding or thromboembolism occur.  Patient verbalized understanding by repeating back information and was advised to contact me if further medication-related questions arise.   Follow-up Return in about 4 days (around 06/05/2020).  [name], PharmD  15 minutes spent face-to-face with the patient during the encounter. 50% of time spent on education, including signs/sx bleeding and clotting, as well as food and drug interactions with warfarin.  50% of time was spent on fingerprick POC INR sample collection,processing, results determination, and documentation

## 2020-06-01 NOTE — Patient Instructions (Signed)
INR below goal. Take 7.5 mg x3 then 5 mg x1. Recheck in 4 days.

## 2020-06-01 NOTE — Telephone Encounter (Signed)
CC, she saw you on 4/11. I see your note and agree. Not sure what the question is. Can you check please?  Thanks MJP

## 2020-06-02 NOTE — Progress Notes (Signed)
Subjective:   Kimberly Hobbs, female    DOB: [birth year], 60 y.o.   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  Patient was started on amiodarone on 05/22/2020 due to recurrence of atrial fibrillation.  Patient now presents for follow-up.  At last visit patient's amiodarone was reduced to 200 mg twice daily.  However patient called the office with concern she had gone back into atrial fibrillation, therefore she was advised to take amiodarone 200 mg 3 times daily. Patient is now feeling well. She denies symptoms suggestive of CVA.  Patient denies chest pain, palpitations, syncope, near syncope, dizziness.  Current Outpatient Medications on File Prior to Visit  Medication Sig Dispense Refill  . acetaminophen (TYLENOL) 500 MG tablet Take 500 mg by mouth every 6 (six) hours as needed (for pain.).    Marland Kitchen albuterol (VENTOLIN HFA) 108 (90 Base) MCG/ACT inhaler Inhale 2 puffs into the lungs every 6 (six) hours as needed for wheezing or shortness of breath.     Marland Kitchen amiodarone (PACERONE) 200 MG tablet Take 1 tablet (200 mg total) by mouth daily. 60 tablet 1  . aspirin EC 81 MG tablet Take 81 mg by mouth daily.     . citalopram (CELEXA) 40 MG tablet Take 20 mg by mouth 2 (two) times a day.    . clonazePAM (KLONOPIN) 0.5 MG tablet Take 0.5 mg by mouth at bedtime.     Marland Kitchen diltiazem (CARDIZEM CD) 180 MG 24 hr capsule TAKE 1 CAPSULE(180 MG) BY MOUTH DAILY 1 capsule 0  . furosemide (LASIX) 20 MG tablet TAKE 1 TABLET BY MOUTH DAILY 90 tablet 2  . HYDROcodone-acetaminophen (NORCO/VICODIN) 5-325 MG tablet Take 1 tablet by mouth as needed.    . isosorbide mononitrate (IMDUR) 60 MG 24 hr tablet TAKE 1 TABLET BY MOUTH DAILY 90 tablet 3  . Lancets  (ONETOUCH DELICA PLUS [id number]) MISC TAKE AS DIRECTED ONCE DAILY.    Marland Kitchen levalbuterol (XOPENEX HFA) 45 MCG/ACT inhaler Inhale 1-2 puffs into the lungs every 6 (six) hours as needed for shortness of breath. 1 Inhaler 12  . losartan (COZAAR) 50 MG tablet TAKE 1 TABLET BY MOUTH DAILY 30 tablet 6  . metFORMIN (GLUCOPHAGE) 500 MG tablet Take 500 mg by mouth 2 (two) times daily.    . metoprolol tartrate (LOPRESSOR) 100 MG tablet TAKE 1 TABLET(100 MG) BY MOUTH TWICE DAILY 180 tablet 3  . nitroGLYCERIN (NITROSTAT) 0.4 MG SL tablet Place 1 tablet (0.4 mg total) under the tongue every 5 (five) minutes x 3 doses as needed for chest pain. 25 tablet 3  . ONETOUCH VERIO test strip AS DIRECTED ONCE A DAY    . pantoprazole (PROTONIX) 40 MG tablet TAKE 1 TABLET(40 MG) BY MOUTH DAILY 90 tablet 1  . rosuvastatin (CRESTOR) 40 MG tablet Take 1 tablet (40 mg total) by mouth daily. 90 tablet 1  . spironolactone (ALDACTONE) 50 MG tablet TAKE 1 TABLET(50 MG) BY MOUTH DAILY 30 tablet 6  . STIOLTO RESPIMAT 2.5-2.5 MCG/ACT AERS INHALE 2 PUFFS INTO THE LUNGS DAILY 4 g 5  . tiZANidine (ZANAFLEX) 4 MG tablet Take 4 mg by mouth as needed.    . warfarin (COUMADIN) 5 MG tablet Take 1 tablet (5 mg total) by mouth daily. Refer to most  recent anticoagulation note for most accurate information. (Patient taking differently: Take 5 mg by mouth daily. 7.5 mg every day or as directed by coumadin clinic) 90 tablet 2  . warfarin (COUMADIN) 7.5 MG tablet TAKE 1 TABLET BY MOUTH EVERY EVENING (Patient taking differently: 7.5 mg every day or as directed by coumadin clinic) 90 tablet 2   No current facility-administered medications on file prior to visit.    Cardiovascular studies:  EKG 06/05/2020: Sinus bradycardia at a rate of 56 bpm.  Left axis.  PRWP, cannot exclude anteroseptal infarct old.  EKG 05/29/2020: Sinus bradycardia rate of 52 bpm.  Left axis.  Poor R wave progression, cannot exclude anteroseptal infarct old.  EKG  05/22/2020: Atrial fibrillation 126 bpm  Left anterior fascicular block  Old anteroseptal infarct Poor R wave progression  EKG 12/06/2019: Atrial fibrillation 11 bpm  Possible old anteroseptal infarct Nonspecific ST-T changes  Coronary and bypass graft angiography 07/21/2018 and 08/18/2018: LM: Normal LAD: 100% mid occlusion. Mild distal disease LIMA-LAD: Patent LCx: Normal RCA: Prox 60% stenosis, mid 30-40% stenoses. TIMI III flow in prox-mid RCA. 100% distal occlusion SVG-RCA: Patent. Ostial 40%. Distal RCA moderate diffuse disease.   Guide catheter angiography provided superior images. There is TIMI III flow in SVG which fills RCA all the way to the ostium. Even if she were to have FFR positive lesion in the graft, I do not think the benefits of a stent would outweigh the risk of losing the graft and the entire RCA territory circulation in a borderline lesion. Also, the prox RCA lesion is well bypassed by the SVG graft. Thus, I decided not to perform FFR/PCI to either of these lesions. Continue medical management.   [place] 07/21/2018: #1: RA: 18 mmHg RV 90/12 mmHg PA: 94/40 mmHg. Mean PA 63 mmHg. PW 33 mmHg  Lasix 80 mg  #2: RA 19 mmHg RV 60/0 mmHg PA: 56/16 mmHg. Mean PA 36 mmHg PW: 25 mmHg  CO: 5.4 L/min. CI 2.6 L/min/m2 PVR 2 WU  Impression: Elevated filling pressures Moderate WHO Grp II pulmonary hypertension Improvement in filling pressures with diuresis.  Echocardiogram 02/26/2018: Left ventricle cavity is normal in size. Mild concentric hypertrophy of the left ventricle. Abnormal septal wall motion due to post-operative valve. Diastolic function could not be assessed due to post op valve and CABG status.  Calculated EF 63%. Left atrial cavity is moderate to severely dilated measures 4.5 cm in long axis. Right atrial cavity is mildly dilated. Mechanical aortic valve with trace regurgitation. Mild aortic valve leaflet calcification. Mildly restricted aortic valve  leaflets. Mild to moderate aortic valve stenosis. Aortic valve peak pressure gradient of  43 and mean gradient of 21 mmHg, calculated aortic valve area    0.88 cm. Mechanical mitral valve with trace regurgitation. Moderately restricted mitral valve leaflets. Mild mitral valve stenosis. Mitral valve peak pressure gradient of  26  and mean gradient of  6.2  mmHg, calculated mitral valve area 1.9   cm. Mild to moderate tricuspid regurgitation. Mild pulmonary hypertension. PA systolic pressure estimated at 39 mm Hg. Compared to the study done on 08/06/2017, no significant change.  TEE 07/21/2018:  1. The left ventricle has normal systolic function, with an ejection fraction of 55-60%. There is abnormal septal motion consistent with post-operative status.  2. The right ventricle has normal systolc function.  3. A 25 mm Regent mechanical valve is present in the mitral position. Procedure Date: 2018 Echo findings are consistent with is functioning normally the mitral prosthesis.  Normal mitral valve prosthesis. Mean PG 6 mmHg, MVA 1.7 cm2. No thrombus seen.  (Patient known to have subtherapeutic INR).  4. A 35m [employer] mechanical prosthesis valve is present in the aortic position. Procedure Date: 2018 Normal aortic valve prosthesis. Echo findings shows no evidence of Accelration time of 88 msec suggests normal functioning valve. Mean PG 25 mmHg  likely due to small annular diameter. No prosthetic valve stenosis noted. of the aortic prosthesis. No thrombus seen. (Patient known to have subtherapeutic INR).  Recent labs: 05/22/2020:  Glucose 129, BUN 25, creatinine 1.09, GFR 59, sodium 136, potassium 4.7, alk phos 113, AST 24, ALT 26 TSH 1.17  02/16/2018: BNP 251 elevated  07/02/2017: A1c 5.7%, HB 12.9/HCT 38.6, platelets 220, normal indicis.  Serum glucose 90 mg, BUN 23, creatinine 0.75, CMP normal.  Potassium 4.6.  TSH normal.  Review of Systems  Constitutional: Negative for malaise/fatigue and weight  gain.  Cardiovascular: Negative for chest pain, claudication, dyspnea on exertion, leg swelling, near-syncope, orthopnea, palpitations, paroxysmal nocturnal dyspnea and syncope.  Respiratory: Negative for shortness of breath.   Hematologic/Lymphatic: Does not bruise/bleed easily.  Musculoskeletal: Positive for joint pain.       Right forearm/elbow pain  Gastrointestinal: Negative for melena.  Neurological: Negative for dizziness and weakness.         Vitals:   06/05/20 0908  BP: (!) 114/40  Pulse: (!) 54  Resp: 16  Temp: 98 F (36.7 C)  SpO2: 97%    Objective:     Physical Exam Vitals and nursing note reviewed.  Constitutional:      General: She is not in acute distress. HENT:     Head: Normocephalic and atraumatic.  Neck:     Vascular: No JVD.  Cardiovascular:     Rate and Rhythm: Normal rate and regular rhythm.     Pulses: Intact distal pulses.     Heart sounds: Normal heart sounds, S1 normal and S2 normal. No murmur heard. No gallop.      Comments: Metallic S1, S2 Pulmonary:     Effort: Pulmonary effort is normal. No respiratory distress.     Breath sounds: Normal breath sounds. No wheezing, rhonchi or rales.  Musculoskeletal:     Right lower leg: No edema.     Left lower leg: No edema.  Neurological:     Mental Status: She is alert.       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  Paroxysmal Afib: Currently in sinus rhythm.  Reduce amiodarone from 200 mg 3 times daily to 200 mg twice daily for 1 week.  As long as patient tolerates this well will then reduce amiodarone further to 200 mg once daily. We will repeat TSH and CMP for monitoring. Patient was started on amiodarone for rhythm control as ablation is not a  likely option in setting of left atrial dilation and given her history of structural abnormalities including mitral and aortic valve replacements for rheumatic disease patient options are amiodarone to stent.  Patient is aware of risks versus benefits and agrees to continue amiodarone. High CHA2DS2VAsc score. Continue warfarin, as below.   Goal: 2.5-3.5 Indication: PAF, MVR, AVR Today's INR: 2.0 Current dose: 2.5 mg Friday, 5 mg all other day New dose: 7.5 mg x3 and then 5 mg x 1  Next INR check: 4 days   CAD of native and bypass grafts without angina,  Patent grafts with non-critical disease.(07/2018) Continue medical management with Aspirin, Imdur 60 mg daiy, losartan 50 mg daily, metoprolol tartarate 100 mg bid, crestor 40 mg daily.  S/p MVR, AVR: Continue warfarin  Hypertension: Controlled. Notably patient's blood pressure is soft, however she is asymptomatic. Counseled her regarding signs and symptoms of hypotension and bradycardia, she will notify our office if she develops symptoms.   Follow up in 4 weeks, sooner if needed, for atrial fibrillation with repeat EKG.     Alethia Berthold, PA-C 06/05/2020, 10:16 AM Office: [phone]

## 2020-06-05 ENCOUNTER — Encounter: Payer: Self-pay | Admitting: Student

## 2020-06-05 ENCOUNTER — Other Ambulatory Visit: Payer: Self-pay

## 2020-06-05 ENCOUNTER — Ambulatory Visit: Payer: 59

## 2020-06-05 ENCOUNTER — Ambulatory Visit: Payer: 59 | Admitting: Student

## 2020-06-05 VITALS — BP 114/40 | HR 54 | Temp 98.0°F | Resp 16 | Ht 65.0 in | Wt 214.0 lb

## 2020-06-05 DIAGNOSIS — Z952 Presence of prosthetic heart valve: Secondary | ICD-10-CM

## 2020-06-05 DIAGNOSIS — Z5181 Encounter for therapeutic drug level monitoring: Secondary | ICD-10-CM

## 2020-06-05 DIAGNOSIS — I480 Paroxysmal atrial fibrillation: Secondary | ICD-10-CM

## 2020-06-05 LAB — POCT INR: INR: 2 (ref 2.0–3.0)

## 2020-06-05 NOTE — Patient Instructions (Signed)
Amiodarone 200 mg twice daily for 1 week, then 200 mg once daily.  Go to [facility] for labs.

## 2020-06-09 ENCOUNTER — Ambulatory Visit: Payer: 59 | Admitting: Pharmacist

## 2020-06-09 ENCOUNTER — Other Ambulatory Visit: Payer: Self-pay

## 2020-06-09 DIAGNOSIS — Z5181 Encounter for therapeutic drug level monitoring: Secondary | ICD-10-CM

## 2020-06-09 DIAGNOSIS — Z952 Presence of prosthetic heart valve: Secondary | ICD-10-CM

## 2020-06-09 LAB — POCT INR: INR: 2.6 (ref 2.0–3.0)

## 2020-06-09 NOTE — Patient Instructions (Signed)
INR at goal. Increase weekly dose to 5 mg every Mon, Wed and 7.5 mg all other days. Recheck INR in 6 days.

## 2020-06-09 NOTE — Progress Notes (Signed)
Anticoagulation Management Kimberly Hobbs is a 60 y.o. female who reports to the clinic for monitoring of warfarin treatment.    Indication: atrial fibrillation and Hx of Mechanical mitral and aortic valve replacement ; CHA2DS2 Vasc Score 4 (Female, HTN, DM, Vascular disease hx), HAS-BLED 1 (ASA, prior bleed hx (hematuria, hematochezia) Duration: indefinite Supervising physician: [facility] Visit History:  Patient does not report signs/symptoms of bleeding or thromboembolism   Other recent changes: No changes in diet, medications, lifestyle.   Pt maintaining steady Vit-K intake of avocado toast 1x/week, salad 4/week.  Amiodarone started on 05/22/20. Recent amiodarone dose of 200 mg BID and planing on decreasing dose to 200 mg qday starting 06/12/20. Reports continued improvement in complains of palpitations, chest pain with current amiodarone dose. Denies any more alerts of Afib on pt's Apple watch.   Anticoagulation Episode Summary    Current INR goal:  2.5-3.5  TTR:  62.2 % (2.2 y)  Next INR check:  06/15/2020  INR from last check:  2.6 (06/09/2020)  Weekly max warfarin dose:    Target end date:  Indefinite  INR check location:    Preferred lab:    Send INR reminders to:     Indications   H/O mitral valve replacement with mechanical valve [Z95.2] Monitoring for long-term anticoagulant use [Z51.81 Z79.01]       Comments:          Allergies  Allergen Reactions  . Iodinated Diagnostic Agents Other (See Comments)    Patient is unsure of reaction type  . Penicillins Other (See Comments)    Immune to drug , does not work per patient  Did it involve swelling of the face/tongue/throat, SOB, or low BP? No Did it involve sudden or severe rash/hives, skin peeling, or any reaction on the inside of your mouth or nose? No Did you need to seek medical attention at a hospital or doctor's office? No When did it last happen?NOT A TRUE ALLERGY If all above  answers are "NO", may proceed with cephalosporin use.     Current Outpatient Medications:  .  acetaminophen (TYLENOL) 500 MG tablet, Take 500 mg by mouth every 6 (six) hours as needed (for pain.)., Disp: , Rfl:  .  albuterol (VENTOLIN HFA) 108 (90 Base) MCG/ACT inhaler, Inhale 2 puffs into the lungs every 6 (six) hours as needed for wheezing or shortness of breath. , Disp: , Rfl:  .  amiodarone (PACERONE) 200 MG tablet, Take 1 tablet (200 mg total) by mouth daily., Disp: 60 tablet, Rfl: 1 .  aspirin EC 81 MG tablet, Take 81 mg by mouth daily. , Disp: , Rfl:  .  citalopram (CELEXA) 40 MG tablet, Take 20 mg by mouth 2 (two) times a day., Disp: , Rfl:  .  clonazePAM (KLONOPIN) 0.5 MG tablet, Take 0.5 mg by mouth at bedtime. , Disp: , Rfl:  .  diltiazem (CARDIZEM CD) 180 MG 24 hr capsule, TAKE 1 CAPSULE(180 MG) BY MOUTH DAILY, Disp: 1 capsule, Rfl: 0 .  furosemide (LASIX) 20 MG tablet, TAKE 1 TABLET BY MOUTH DAILY, Disp: 90 tablet, Rfl: 2 .  HYDROcodone-acetaminophen (NORCO/VICODIN) 5-325 MG tablet, Take 1 tablet by mouth as needed., Disp: , Rfl:  .  isosorbide mononitrate (IMDUR) 60 MG 24 hr tablet, TAKE 1 TABLET BY MOUTH DAILY, Disp: 90 tablet, Rfl: 3 .  Lancets (ONETOUCH DELICA PLUS [id number]) MISC, TAKE AS DIRECTED ONCE DAILY., Disp: , Rfl:  .  levalbuterol (XOPENEX HFA) 45 MCG/ACT  inhaler, Inhale 1-2 puffs into the lungs every 6 (six) hours as needed for shortness of breath., Disp: 1 Inhaler, Rfl: 12 .  losartan (COZAAR) 50 MG tablet, TAKE 1 TABLET BY MOUTH DAILY, Disp: 30 tablet, Rfl: 6 .  metFORMIN (GLUCOPHAGE) 500 MG tablet, Take 500 mg by mouth 2 (two) times daily., Disp: , Rfl:  .  metoprolol tartrate (LOPRESSOR) 100 MG tablet, TAKE 1 TABLET(100 MG) BY MOUTH TWICE DAILY, Disp: 180 tablet, Rfl: 3 .  nitroGLYCERIN (NITROSTAT) 0.4 MG SL tablet, Place 1 tablet (0.4 mg total) under the tongue every 5 (five) minutes x 3 doses as needed for chest pain., Disp: 25 tablet, Rfl: 3 .  ONETOUCH VERIO  test strip, AS DIRECTED ONCE A DAY, Disp: , Rfl:  .  pantoprazole (PROTONIX) 40 MG tablet, TAKE 1 TABLET(40 MG) BY MOUTH DAILY, Disp: 90 tablet, Rfl: 1 .  rosuvastatin (CRESTOR) 40 MG tablet, Take 1 tablet (40 mg total) by mouth daily., Disp: 90 tablet, Rfl: 1 .  spironolactone (ALDACTONE) 50 MG tablet, TAKE 1 TABLET(50 MG) BY MOUTH DAILY, Disp: 30 tablet, Rfl: 6 .  STIOLTO RESPIMAT 2.5-2.5 MCG/ACT AERS, INHALE 2 PUFFS INTO THE LUNGS DAILY, Disp: 4 g, Rfl: 5 .  tiZANidine (ZANAFLEX) 4 MG tablet, Take 4 mg by mouth as needed., Disp: , Rfl:  .  warfarin (COUMADIN) 5 MG tablet, Take 1 tablet (5 mg total) by mouth daily. Refer to most recent anticoagulation note for most accurate information. (Patient taking differently: Take 5 mg by mouth daily. 7.5 mg every day or as directed by coumadin clinic), Disp: 90 tablet, Rfl: 2 .  warfarin (COUMADIN) 7.5 MG tablet, TAKE 1 TABLET BY MOUTH EVERY EVENING (Patient taking differently: 7.5 mg every day or as directed by coumadin clinic), Disp: 90 tablet, Rfl: 2 Past Medical History:  Diagnosis Date  . COVID-19   . Hyperlipidemia   . Hypertension   . Rheumatic heart disease    MS/ AS    ASSESSMENT  Recent Results: The most recent result is correlated with 47.5 mg per week:  Lab Results  Component Value Date   INR 2.6 06/09/2020   INR 2.0 06/05/2020   INR 1.6 (A) 06/01/2020    Anticoagulation Dosing: Description   INR below goal. Take 7.5 mg x 3 then 5 mg x 1. Recheck in 4 days.      INR today: Therapeutic. Following recent boost dose in setting of amiodarone dose changes. Amiodarone dose continues to be titrated and optimize to manage pt's Afib concerns. Delayed DDI between amiodarone and warfarin stil a major concern of uncertain INR response. Planned amiodarone dose decrease and possible associated decreased anticoagulation effect. Weekly warfarin dose last week of 50 mg/week. Will continue close dose adjustment with planned weekly dose of 47.5  mg/week   PLAN Weekly dose was increased by 17.6% to 47.5 mg/week. Take 5 mg every Mon, Wed and 7.5 mg all other days. Recheck INR in 1 week.    There are no Patient Instructions on file for this visit. Patient advised to contact clinic or seek medical attention if signs/symptoms of bleeding or thromboembolism occur.  Patient verbalized understanding by repeating back information and was advised to contact me if further medication-related questions arise.   Follow-up No follow-ups on file.  [name], PharmD  15 minutes spent face-to-face with the patient during the encounter. 50% of time spent on education, including signs/sx bleeding and clotting, as well as food and drug interactions with warfarin. 50%  of time was spent on fingerprick POC INR sample collection,processing, results determination, and documentation

## 2020-06-15 ENCOUNTER — Other Ambulatory Visit: Payer: Self-pay

## 2020-06-15 ENCOUNTER — Ambulatory Visit: Payer: 59 | Admitting: Pharmacist

## 2020-06-15 DIAGNOSIS — Z7901 Long term (current) use of anticoagulants: Secondary | ICD-10-CM

## 2020-06-15 DIAGNOSIS — Z5181 Encounter for therapeutic drug level monitoring: Secondary | ICD-10-CM

## 2020-06-15 DIAGNOSIS — Z952 Presence of prosthetic heart valve: Secondary | ICD-10-CM

## 2020-06-15 LAB — POCT INR: INR: 2.5 (ref 2.0–3.0)

## 2020-06-15 NOTE — Progress Notes (Signed)
Anticoagulation Management Kimberly Hobbs is a 60 y.o. female who reports to the clinic for monitoring of warfarin treatment.    Indication: atrial fibrillation and Hx of Mechanical mitral and aortic valve replacement ; CHA2DS2 Vasc Score 4 (Female, HTN, DM, Vascular disease hx), HAS-BLED 1 (ASA, prior bleed hx (hematuria, hematochezia) Duration: indefinite Supervising physician: [facility] Visit History:  Patient does not report signs/symptoms of bleeding or thromboembolism   Other recent changes: No changes in diet, medications, lifestyle.   Pt maintaining steady Vit-K intake of avocado toast 1x/week, salad 4/week.  Amiodarone started on 05/22/20. Amiodarone dose decreased to 200 mg qday since 06/12/20. Reports continued improvement in complains of palpitations with current amiodarone dose. Denies any more alerts of Afib on pt's Apple watch.   Anticoagulation Episode Summary    Current INR goal:  2.5-3.5  TTR:  62.4 % (2.2 y)  Next INR check:  06/22/2020  INR from last check:  2.5 (06/15/2020)  Weekly max warfarin dose:    Target end date:  Indefinite  INR check location:    Preferred lab:    Send INR reminders to:     Indications   H/O mitral valve replacement with mechanical valve [Z95.2] Monitoring for long-term anticoagulant use [Z51.81 Z79.01]       Comments:          Allergies  Allergen Reactions  . Iodinated Diagnostic Agents Other (See Comments)    Patient is unsure of reaction type  . Penicillins Other (See Comments)    Immune to drug , does not work per patient  Did it involve swelling of the face/tongue/throat, SOB, or low BP? No Did it involve sudden or severe rash/hives, skin peeling, or any reaction on the inside of your mouth or nose? No Did you need to seek medical attention at a hospital or doctor's office? No When did it last happen?NOT A TRUE ALLERGY If all above answers are "NO", may proceed with cephalosporin use.      Current Outpatient Medications:  .  acetaminophen (TYLENOL) 500 MG tablet, Take 500 mg by mouth every 6 (six) hours as needed (for pain.)., Disp: , Rfl:  .  albuterol (VENTOLIN HFA) 108 (90 Base) MCG/ACT inhaler, Inhale 2 puffs into the lungs every 6 (six) hours as needed for wheezing or shortness of breath. , Disp: , Rfl:  .  amiodarone (PACERONE) 200 MG tablet, Take 1 tablet (200 mg total) by mouth daily., Disp: 60 tablet, Rfl: 1 .  aspirin EC 81 MG tablet, Take 81 mg by mouth daily. , Disp: , Rfl:  .  citalopram (CELEXA) 40 MG tablet, Take 20 mg by mouth 2 (two) times a day., Disp: , Rfl:  .  clonazePAM (KLONOPIN) 0.5 MG tablet, Take 0.5 mg by mouth at bedtime. , Disp: , Rfl:  .  diltiazem (CARDIZEM CD) 180 MG 24 hr capsule, TAKE 1 CAPSULE(180 MG) BY MOUTH DAILY, Disp: 1 capsule, Rfl: 0 .  furosemide (LASIX) 20 MG tablet, TAKE 1 TABLET BY MOUTH DAILY, Disp: 90 tablet, Rfl: 2 .  HYDROcodone-acetaminophen (NORCO/VICODIN) 5-325 MG tablet, Take 1 tablet by mouth as needed., Disp: , Rfl:  .  isosorbide mononitrate (IMDUR) 60 MG 24 hr tablet, TAKE 1 TABLET BY MOUTH DAILY, Disp: 90 tablet, Rfl: 3 .  Lancets (ONETOUCH DELICA PLUS [id number]) MISC, TAKE AS DIRECTED ONCE DAILY., Disp: , Rfl:  .  levalbuterol (XOPENEX HFA) 45 MCG/ACT inhaler, Inhale 1-2 puffs into the lungs every 6 (six) hours  as needed for shortness of breath., Disp: 1 Inhaler, Rfl: 12 .  losartan (COZAAR) 50 MG tablet, TAKE 1 TABLET BY MOUTH DAILY, Disp: 30 tablet, Rfl: 6 .  metFORMIN (GLUCOPHAGE) 500 MG tablet, Take 500 mg by mouth 2 (two) times daily., Disp: , Rfl:  .  metoprolol tartrate (LOPRESSOR) 100 MG tablet, TAKE 1 TABLET(100 MG) BY MOUTH TWICE DAILY, Disp: 180 tablet, Rfl: 3 .  nitroGLYCERIN (NITROSTAT) 0.4 MG SL tablet, Place 1 tablet (0.4 mg total) under the tongue every 5 (five) minutes x 3 doses as needed for chest pain., Disp: 25 tablet, Rfl: 3 .  ONETOUCH VERIO test strip, AS DIRECTED ONCE A DAY, Disp: , Rfl:  .   pantoprazole (PROTONIX) 40 MG tablet, TAKE 1 TABLET(40 MG) BY MOUTH DAILY, Disp: 90 tablet, Rfl: 1 .  rosuvastatin (CRESTOR) 40 MG tablet, Take 1 tablet (40 mg total) by mouth daily., Disp: 90 tablet, Rfl: 1 .  spironolactone (ALDACTONE) 50 MG tablet, TAKE 1 TABLET(50 MG) BY MOUTH DAILY, Disp: 30 tablet, Rfl: 6 .  STIOLTO RESPIMAT 2.5-2.5 MCG/ACT AERS, INHALE 2 PUFFS INTO THE LUNGS DAILY, Disp: 4 g, Rfl: 5 .  tiZANidine (ZANAFLEX) 4 MG tablet, Take 4 mg by mouth as needed., Disp: , Rfl:  .  warfarin (COUMADIN) 5 MG tablet, Take 1 tablet (5 mg total) by mouth daily. Refer to most recent anticoagulation note for most accurate information. (Patient taking differently: Take 5 mg by mouth daily. 7.5 mg every day or as directed by coumadin clinic), Disp: 90 tablet, Rfl: 2 .  warfarin (COUMADIN) 7.5 MG tablet, TAKE 1 TABLET BY MOUTH EVERY EVENING (Patient taking differently: 7.5 mg every day or as directed by coumadin clinic), Disp: 90 tablet, Rfl: 2 Past Medical History:  Diagnosis Date  . COVID-19   . Hyperlipidemia   . Hypertension   . Rheumatic heart disease    MS/ AS    ASSESSMENT  Recent Results: The most recent result is correlated with 47.5 mg per week:  Lab Results  Component Value Date   INR 2.5 06/15/2020   INR 2.6 06/09/2020   INR 2.0 06/05/2020    Anticoagulation Dosing: Description   INR at goal. Continue current weekly dose of 5 mg every Mon, Wed and 7.5 mg all other days. Recheck INR in 1 week     INR today: Therapeutic. Continues to remain therapeutic following recent weekly dose increase and amiodarone dose decrease. Amiodarone dose continues to be titrated and optimize to manage pt's Afib concerns. Delayed DDI between amiodarone and warfarin stil a major concern of uncertain INR response. Possible delayed amiodarone induce decreased anticoagulation effect following recent amiodarone dose decrease. Will continue close monitoring and follow up.   PLAN Weekly dose was  unchanged by 0% to 47.5 mg/week. Take 5 mg every Mon, Wed and 7.5 mg all other days. Recheck INR in 1 week.    Patient Instructions  INR at goal. Continue current weekly dose of 5 mg every Mon, Wed and 7.5 mg all other days. Recheck INR in 1 week  Patient advised to contact clinic or seek medical attention if signs/symptoms of bleeding or thromboembolism occur.  Patient verbalized understanding by repeating back information and was advised to contact me if further medication-related questions arise.   Follow-up Return in about 1 week (around 06/22/2020).  [name], PharmD  15 minutes spent face-to-face with the patient during the encounter. 50% of time spent on education, including signs/sx bleeding and clotting, as well as  food and drug interactions with warfarin. 50% of time was spent on fingerprick POC INR sample collection,processing, results determination, and documentation

## 2020-06-15 NOTE — Patient Instructions (Signed)
INR at goal. Continue current weekly dose of 5 mg every Mon, Wed and 7.5 mg all other days. Recheck INR in 1 week

## 2020-06-18 ENCOUNTER — Other Ambulatory Visit: Payer: Self-pay | Admitting: Cardiology

## 2020-06-18 DIAGNOSIS — R079 Chest pain, unspecified: Secondary | ICD-10-CM

## 2020-06-19 ENCOUNTER — Other Ambulatory Visit: Payer: Self-pay

## 2020-06-19 DIAGNOSIS — R079 Chest pain, unspecified: Secondary | ICD-10-CM

## 2020-06-19 MED ORDER — ISOSORBIDE MONONITRATE ER 60 MG PO TB24: 60.0000 mg | ORAL_TABLET | Freq: Every day | ORAL | 3 refills | Status: DC

## 2020-06-22 ENCOUNTER — Ambulatory Visit: Payer: 59 | Admitting: Pharmacist

## 2020-06-22 ENCOUNTER — Other Ambulatory Visit: Payer: Self-pay

## 2020-06-22 DIAGNOSIS — Z5181 Encounter for therapeutic drug level monitoring: Secondary | ICD-10-CM

## 2020-06-22 DIAGNOSIS — Z952 Presence of prosthetic heart valve: Secondary | ICD-10-CM

## 2020-06-22 DIAGNOSIS — Z7901 Long term (current) use of anticoagulants: Secondary | ICD-10-CM

## 2020-06-22 LAB — POCT INR: INR: 2.6 (ref 2.0–3.0)

## 2020-06-22 NOTE — Progress Notes (Signed)
Anticoagulation Management Kimberly Hobbs is a 60 y.o. female who reports to the clinic for monitoring of warfarin treatment.    Indication: atrial fibrillation and Hx of Mechanical mitral and aortic valve replacement ; CHA2DS2 Vasc Score 4 (Female, HTN, DM, Vascular disease hx), HAS-BLED 1 (ASA, prior bleed hx (hematuria, hematochezia) Duration: indefinite Supervising physician: [facility] Visit History:  Patient does not report signs/symptoms of bleeding or thromboembolism   Other recent changes: No changes in diet, medications, lifestyle.   Pt maintaining steady Vit-K intake of avocado toast 1x/week, salad 4/week.  Amiodarone started on 05/22/20. Amiodarone dose decreased to 200 mg qday since 06/12/20. Reports continued improvement in complains of palpitations with current amiodarone dose. Denies any more alerts of Afib on pt's Apple watch.   Anticoagulation Episode Summary    Current INR goal:  2.5-3.5  TTR:  62.8 % (2.2 y)  Next INR check:  07/05/2020  INR from last check:  2.6 (06/22/2020)  Weekly max warfarin dose:    Target end date:  Indefinite  INR check location:    Preferred lab:    Send INR reminders to:     Indications   H/O mitral valve replacement with mechanical valve [Z95.2] Monitoring for long-term anticoagulant use [Z51.81 Z79.01]       Comments:          Allergies  Allergen Reactions  . Iodinated Diagnostic Agents Other (See Comments)    Patient is unsure of reaction type  . Penicillins Other (See Comments)    Immune to drug , does not work per patient  Did it involve swelling of the face/tongue/throat, SOB, or low BP? No Did it involve sudden or severe rash/hives, skin peeling, or any reaction on the inside of your mouth or nose? No Did you need to seek medical attention at a hospital or doctor's office? No When did it last happen?NOT A TRUE ALLERGY If all above answers are "NO", may proceed with cephalosporin use.      Current Outpatient Medications:  .  acetaminophen (TYLENOL) 500 MG tablet, Take 500 mg by mouth every 6 (six) hours as needed (for pain.)., Disp: , Rfl:  .  albuterol (VENTOLIN HFA) 108 (90 Base) MCG/ACT inhaler, Inhale 2 puffs into the lungs every 6 (six) hours as needed for wheezing or shortness of breath. , Disp: , Rfl:  .  amiodarone (PACERONE) 200 MG tablet, Take 1 tablet (200 mg total) by mouth daily., Disp: 60 tablet, Rfl: 1 .  aspirin EC 81 MG tablet, Take 81 mg by mouth daily. , Disp: , Rfl:  .  citalopram (CELEXA) 40 MG tablet, Take 20 mg by mouth 2 (two) times a day., Disp: , Rfl:  .  clonazePAM (KLONOPIN) 0.5 MG tablet, Take 0.5 mg by mouth at bedtime. , Disp: , Rfl:  .  diltiazem (CARDIZEM CD) 180 MG 24 hr capsule, TAKE 1 CAPSULE(180 MG) BY MOUTH DAILY, Disp: 1 capsule, Rfl: 0 .  furosemide (LASIX) 20 MG tablet, TAKE 1 TABLET BY MOUTH DAILY, Disp: 90 tablet, Rfl: 2 .  HYDROcodone-acetaminophen (NORCO/VICODIN) 5-325 MG tablet, Take 1 tablet by mouth as needed., Disp: , Rfl:  .  isosorbide mononitrate (IMDUR) 60 MG 24 hr tablet, Take 1 tablet (60 mg total) by mouth daily., Disp: 90 tablet, Rfl: 3 .  Lancets (ONETOUCH DELICA PLUS [id number]) MISC, TAKE AS DIRECTED ONCE DAILY., Disp: , Rfl:  .  levalbuterol (XOPENEX HFA) 45 MCG/ACT inhaler, Inhale 1-2 puffs into the lungs every  6 (six) hours as needed for shortness of breath., Disp: 1 Inhaler, Rfl: 12 .  losartan (COZAAR) 50 MG tablet, TAKE 1 TABLET BY MOUTH DAILY, Disp: 30 tablet, Rfl: 6 .  metFORMIN (GLUCOPHAGE) 500 MG tablet, Take 500 mg by mouth 2 (two) times daily., Disp: , Rfl:  .  metoprolol tartrate (LOPRESSOR) 100 MG tablet, TAKE 1 TABLET(100 MG) BY MOUTH TWICE DAILY, Disp: 180 tablet, Rfl: 3 .  nitroGLYCERIN (NITROSTAT) 0.4 MG SL tablet, Place 1 tablet (0.4 mg total) under the tongue every 5 (five) minutes x 3 doses as needed for chest pain., Disp: 25 tablet, Rfl: 3 .  ONETOUCH VERIO test strip, AS DIRECTED ONCE A DAY, Disp:  , Rfl:  .  pantoprazole (PROTONIX) 40 MG tablet, TAKE 1 TABLET(40 MG) BY MOUTH DAILY, Disp: 90 tablet, Rfl: 1 .  rosuvastatin (CRESTOR) 40 MG tablet, Take 1 tablet (40 mg total) by mouth daily., Disp: 90 tablet, Rfl: 1 .  spironolactone (ALDACTONE) 50 MG tablet, TAKE 1 TABLET(50 MG) BY MOUTH DAILY, Disp: 30 tablet, Rfl: 6 .  STIOLTO RESPIMAT 2.5-2.5 MCG/ACT AERS, INHALE 2 PUFFS INTO THE LUNGS DAILY, Disp: 4 g, Rfl: 5 .  tiZANidine (ZANAFLEX) 4 MG tablet, Take 4 mg by mouth as needed., Disp: , Rfl:  .  warfarin (COUMADIN) 5 MG tablet, Take 1 tablet (5 mg total) by mouth daily. Refer to most recent anticoagulation note for most accurate information. (Patient taking differently: Take 5 mg by mouth daily. 7.5 mg every day or as directed by coumadin clinic), Disp: 90 tablet, Rfl: 2 .  warfarin (COUMADIN) 7.5 MG tablet, TAKE 1 TABLET BY MOUTH EVERY EVENING (Patient taking differently: 7.5 mg every day or as directed by coumadin clinic), Disp: 90 tablet, Rfl: 2 Past Medical History:  Diagnosis Date  . COVID-19   . Hyperlipidemia   . Hypertension   . Rheumatic heart disease    MS/ AS    ASSESSMENT  Recent Results: The most recent result is correlated with 47.5 mg per week:  Lab Results  Component Value Date   INR 2.6 06/22/2020   INR 2.5 06/15/2020   INR 2.6 06/09/2020    Anticoagulation Dosing: Description   INR at goal. Continue current weekly dose of 5 mg every Mon, Wed and 7.5 mg all other days. Recheck INR in 2 week     INR today: Therapeutic. Continues to remain therapeutic on current weekly dose. Recent amiodarone dose continues to remain stable at 200 mg daily. Will continue to monitor closely for possible delayed amiodarone induced warfarin DDI. Denies any relevant changes in diet, medications, or lifestyles. Denies any sign or symptoms of bleeding or bruising symptoms. Denies any signs or symptoms of thromboembolic events. Will continue current weekly dose and continue close  monitoring.   PLAN Weekly dose was unchanged by 0% to 47.5 mg/week. Take 5 mg every Mon, Wed and 7.5 mg all other days. Recheck INR in 2 week.    Patient Instructions  INR at goal. Continue current weekly dose of 5 mg every Mon, Wed and 7.5 mg all other days. Recheck INR in 2 week  Patient advised to contact clinic or seek medical attention if signs/symptoms of bleeding or thromboembolism occur.  Patient verbalized understanding by repeating back information and was advised to contact me if further medication-related questions arise.   Follow-up Return in about 13 days (around 07/05/2020).  [name], PharmD  15 minutes spent face-to-face with the patient during the encounter. 50% of time  spent on education, including signs/sx bleeding and clotting, as well as food and drug interactions with warfarin. 50% of time was spent on fingerprick POC INR sample collection,processing, results determination, and documentation

## 2020-06-22 NOTE — Patient Instructions (Signed)
INR at goal. Continue current weekly dose of 5 mg every Mon, Wed and 7.5 mg all other days. Recheck INR in 2 week

## 2020-07-03 ENCOUNTER — Ambulatory Visit: Payer: 59 | Admitting: Student

## 2020-07-03 NOTE — Progress Notes (Signed)
Subjective:   Kimberly Hobbs, female    DOB: 1960/06/09, 60 y.o.   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  Patient is doing well. She has had only occasional palpitations. She has not had any sustained tachycardia episodes. Chest pressure is more on laying down, not necessarily related to exertion.   Reviewed recent lab results with the patient, details below.   Current Outpatient Medications on File Prior to Visit  Medication Sig Dispense Refill  . acetaminophen (TYLENOL) 500 MG tablet Take 500 mg by mouth every 6 (six) hours as needed (for pain.).    Marland Kitchen albuterol (VENTOLIN HFA) 108 (90 Base) MCG/ACT inhaler Inhale 2 puffs into the lungs every 6 (six) hours as needed for wheezing or shortness of breath.     Marland Kitchen amiodarone (PACERONE) 200 MG tablet Take 1 tablet (200 mg total) by mouth daily. 60 tablet 1  . aspirin EC 81 MG tablet Take 81 mg by mouth daily.     . citalopram (CELEXA) 40 MG tablet Take 20 mg by mouth 2 (two) times a day.    . clonazePAM (KLONOPIN) 0.5 MG tablet Take 0.5 mg by mouth at bedtime.     Marland Kitchen diltiazem (CARDIZEM CD) 180 MG 24 hr capsule TAKE 1 CAPSULE(180 MG) BY MOUTH DAILY 1 capsule 0  . furosemide (LASIX) 20 MG tablet TAKE 1 TABLET BY MOUTH DAILY 90 tablet 2  . HYDROcodone-acetaminophen (NORCO/VICODIN) 5-325 MG tablet Take 1 tablet by mouth as needed.    . isosorbide mononitrate (IMDUR) 60 MG 24 hr tablet Take 1 tablet (60 mg total) by mouth daily. 90 tablet 3  . Lancets (ONETOUCH DELICA PLUS [id number]) MISC TAKE AS DIRECTED ONCE DAILY.    Marland Kitchen levalbuterol (XOPENEX HFA) 45 MCG/ACT inhaler Inhale 1-2 puffs into the lungs every 6 (six) hours as needed for shortness of breath. 1 Inhaler 12  . losartan  (COZAAR) 50 MG tablet TAKE 1 TABLET BY MOUTH DAILY 30 tablet 6  . metFORMIN (GLUCOPHAGE) 500 MG tablet Take 500 mg by mouth 2 (two) times daily.    . metoprolol tartrate (LOPRESSOR) 100 MG tablet TAKE 1 TABLET(100 MG) BY MOUTH TWICE DAILY 180 tablet 3  . nitroGLYCERIN (NITROSTAT) 0.4 MG SL tablet Place 1 tablet (0.4 mg total) under the tongue every 5 (five) minutes x 3 doses as needed for chest pain. 25 tablet 3  . ONETOUCH VERIO test strip AS DIRECTED ONCE A DAY    . pantoprazole (PROTONIX) 40 MG tablet TAKE 1 TABLET(40 MG) BY MOUTH DAILY 90 tablet 1  . rosuvastatin (CRESTOR) 40 MG tablet Take 1 tablet (40 mg total) by mouth daily. 90 tablet 1  . spironolactone (ALDACTONE) 50 MG tablet TAKE 1 TABLET(50 MG) BY MOUTH DAILY 30 tablet 6  . STIOLTO RESPIMAT 2.5-2.5 MCG/ACT AERS INHALE 2 PUFFS INTO THE LUNGS DAILY 4 g 5  . tiZANidine (ZANAFLEX) 4 MG tablet Take 4 mg by mouth as needed.    . warfarin (COUMADIN) 5 MG tablet Take 1 tablet (5 mg total) by mouth daily. Refer to most recent anticoagulation note for most accurate information. (Patient taking differently: Take 5 mg by mouth daily. 7.5 mg every day or as directed by coumadin clinic) 90 tablet 2  . warfarin (COUMADIN) 7.5 MG  tablet TAKE 1 TABLET BY MOUTH EVERY EVENING (Patient taking differently: 7.5 mg every day or as directed by coumadin clinic) 90 tablet 2   No current facility-administered medications on file prior to visit.    Cardiovascular studies:  EKG 07/05/2020: Probable sinus bradycardia 49 bpm Left anterior fascicular block Possible old anteroseptal infarct Baseline wander  EKG 05/22/2020: Atrial fibrillation 126 bpm  Left anterior fascicular block  Old anteroseptal infarct Poor R wave progression   EKG 12/06/2019: Atrial fibrillation 11 bpm  Possible old anteroseptal infarct Nonspecific ST-T changes  Coronary and bypass graft angiography 07/21/2018 and 08/18/2018: LM: Normal LAD: 100% mid occlusion. Mild distal  disease LIMA-LAD: Patent LCx: Normal RCA: Prox 60% stenosis, mid 30-40% stenoses. TIMI III flow in prox-mid RCA. 100% distal occlusion SVG-RCA: Patent. Ostial 40%. Distal RCA moderate diffuse disease.   Guide catheter angiography provided superior images. There is TIMI III flow in SVG which fills RCA all the way to the ostium. Even if she were to have FFR positive lesion in the graft, I do not think the benefits of a stent would outweigh the risk of losing the graft and the entire RCA territory circulation in a borderline lesion. Also, the prox RCA lesion is well bypassed by the SVG graft. Thus, I decided not to perform FFR/PCI to either of these lesions. Continue medical management.   [place] 07/21/2018: #1: RA: 18 mmHg RV 90/12 mmHg PA: 94/40 mmHg. Mean PA 63 mmHg. PW 33 mmHg  Lasix 80 mg  #2: RA 19 mmHg RV 60/0 mmHg PA: 56/16 mmHg. Mean PA 36 mmHg PW: 25 mmHg  CO: 5.4 L/min. CI 2.6 L/min/m2 PVR 2 WU  Impression: Elevated filling pressures Moderate WHO Grp II pulmonary hypertension Improvement in filling pressures with diuresis.  Echocardiogram 02/26/2018: Left ventricle cavity is normal in size. Mild concentric hypertrophy of the left ventricle. Abnormal septal wall motion due to post-operative valve. Diastolic function could not be assessed due to post op valve and CABG status.  Calculated EF 63%. Left atrial cavity is moderate to severely dilated measures 4.5 cm in long axis. Right atrial cavity is mildly dilated. Mechanical aortic valve with trace regurgitation. Mild aortic valve leaflet calcification. Mildly restricted aortic valve leaflets. Mild to moderate aortic valve stenosis. Aortic valve peak pressure gradient of  43 and mean gradient of 21 mmHg, calculated aortic valve area    0.88 cm. Mechanical mitral valve with trace regurgitation. Moderately restricted mitral valve leaflets. Mild mitral valve stenosis. Mitral valve peak pressure gradient of  26  and mean gradient of   6.2  mmHg, calculated mitral valve area 1.9   cm. Mild to moderate tricuspid regurgitation. Mild pulmonary hypertension. PA systolic pressure estimated at 39 mm Hg. Compared to the study done on 08/06/2017, no significant change.  TEE 07/21/2018:  1. The left ventricle has normal systolic function, with an ejection fraction of 55-60%. There is abnormal septal motion consistent with post-operative status.  2. The right ventricle has normal systolc function.  3. A 25 mm Regent mechanical valve is present in the mitral position. Procedure Date: 2018 Echo findings are consistent with is functioning normally the mitral prosthesis. Normal mitral valve prosthesis. Mean PG 6 mmHg, MVA 1.7 cm2. No thrombus seen.  (Patient known to have subtherapeutic INR).  4. A 60m [employer] mechanical prosthesis valve is present in the aortic position. Procedure Date: 2018 Normal aortic valve prosthesis. Echo findings shows no evidence of Accelration time of 88 msec suggests normal functioning valve. Mean  PG 25 mmHg  likely due to small annular diameter. No prosthetic valve stenosis noted. of the aortic prosthesis. No thrombus seen. (Patient known to have subtherapeutic INR).  Recent labs: April-May 2022: Glucose 129, BUN/Cr 25/1.09. eGFR 59. Na/K 136/4.7. Rest of the CMP normal H/H 11.9/36.8. MCV 92. Platelets 199 TSH 2.8 normal  02/16/2018: BNP 251 elevated  Labs 07/02/2017: A1c 5.7%, HB 12.9/HCT 38.6, platelets 220, normal indicis.  Serum glucose 90 mg, BUN 23, creatinine 0.75, CMP normal.  Potassium 4.6.  TSH normal.  Review of Systems  Cardiovascular: Positive for dyspnea on exertion and palpitations. Negative for chest pain, leg swelling and syncope.  Musculoskeletal: Positive for joint pain.       Right forearm/elbow pain         Vitals:   07/05/20 0901  BP: (!) 128/49  Pulse: (!) 50  Temp: (!) 97.3 F (36.3 C)  SpO2: 96%    Objective:     Physical Exam Vitals and nursing note reviewed.   Constitutional:      General: She is not in acute distress. Neck:     Vascular: No JVD.  Cardiovascular:     Rate and Rhythm: Tachycardia present. Rhythm irregular.     Heart sounds: Normal heart sounds. No murmur heard.     Comments: Metallic S1, S2 Pulmonary:     Effort: Pulmonary effort is normal.     Breath sounds: Normal breath sounds. No wheezing or rales.          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  Paroxysmal Afib: Currently in sinus rhythm Continue amiodarone 200 mg daily. High CHA2DS2VAsc score. Continue warfarin, as below.    Goal: 2.5-3.5 Indication: PAF, MVR, AVR Today's INR: 2.7 Current dose: 5 mg Mon, Wed, 7.5 mg all other days New dose: 5 mg wed, 7.5 mg all other days . (Change made owing to pt's planned increased Vit K intake) Next INR check: 2 week  CAD of native and bypass grafts without angina,  Patent grafts with non-critical disease.(07/2018) Continue medical management with Aspirin, Imdur 60 mg daiy, losartan 50 mg daily, metoprolol tartarate 100 mg bid, crestor 40 mg daily.  S/p MVR, AVR: Continue warfarin  Hypertension: Controlled.   F/u in 6 months  Keshonda Monsour Esther Hardy, MD [facility] Pager: [phone] Office: [phone] If no answer Cell [phone]

## 2020-07-04 LAB — CBC
Hematocrit: 36.8 % (ref 34.0–46.6)
Hemoglobin: 11.9 g/dL (ref 11.1–15.9)
MCH: 29.7 pg (ref 26.6–33.0)
MCHC: 32.3 g/dL (ref 31.5–35.7)
MCV: 92 fL (ref 79–97)
Platelets: 199 10*3/uL (ref 150–450)
RBC: 4.01 x10E6/uL (ref 3.77–5.28)
RDW: 13.9 % (ref 11.7–15.4)
WBC: 6.9 10*3/uL (ref 3.4–10.8)

## 2020-07-04 LAB — TSH: TSH: 2.87 u[IU]/mL (ref 0.450–4.500)

## 2020-07-05 ENCOUNTER — Ambulatory Visit: Payer: 59 | Admitting: Cardiology

## 2020-07-05 ENCOUNTER — Encounter: Payer: Self-pay | Admitting: Cardiology

## 2020-07-05 ENCOUNTER — Other Ambulatory Visit: Payer: Self-pay

## 2020-07-05 VITALS — BP 128/49 | HR 50 | Temp 97.3°F | Ht 65.0 in | Wt 216.0 lb

## 2020-07-05 DIAGNOSIS — Z7901 Long term (current) use of anticoagulants: Secondary | ICD-10-CM

## 2020-07-05 DIAGNOSIS — Z5181 Encounter for therapeutic drug level monitoring: Secondary | ICD-10-CM

## 2020-07-05 DIAGNOSIS — I480 Paroxysmal atrial fibrillation: Secondary | ICD-10-CM

## 2020-07-05 DIAGNOSIS — I10 Essential (primary) hypertension: Secondary | ICD-10-CM

## 2020-07-05 DIAGNOSIS — Z952 Presence of prosthetic heart valve: Secondary | ICD-10-CM

## 2020-07-05 DIAGNOSIS — I25708 Atherosclerosis of coronary artery bypass graft(s), unspecified, with other forms of angina pectoris: Secondary | ICD-10-CM

## 2020-07-05 LAB — POCT INR: INR: 2.7 (ref 2.0–3.0)

## 2020-07-05 NOTE — Patient Instructions (Signed)
INR at goal. Increase weekly dose to 5 mg every Wed and 7.5 mg all other days. Recheck INR in 2 week

## 2020-07-19 ENCOUNTER — Other Ambulatory Visit: Payer: Self-pay

## 2020-07-19 ENCOUNTER — Ambulatory Visit: Payer: 59 | Admitting: Pharmacist

## 2020-07-19 DIAGNOSIS — Z5181 Encounter for therapeutic drug level monitoring: Secondary | ICD-10-CM

## 2020-07-19 DIAGNOSIS — Z7901 Long term (current) use of anticoagulants: Secondary | ICD-10-CM

## 2020-07-19 DIAGNOSIS — Z952 Presence of prosthetic heart valve: Secondary | ICD-10-CM

## 2020-07-19 LAB — POCT INR: INR: 3.6 — AB (ref 2.0–3.0)

## 2020-07-19 NOTE — Patient Instructions (Signed)
INR slightly above goal. Continue current weekly dose of 5 mg every Wed and 7.5 mg all other days. Recheck INR in 3 week

## 2020-07-19 NOTE — Progress Notes (Signed)
Anticoagulation Management Kimberly Hobbs is a 60 y.o. female who reports to the clinic for monitoring of warfarin treatment.    Indication: atrial fibrillation and Hx of Mechanical mitral and aortic valve replacement ; CHA2DS2 Vasc Score 4 (Female, HTN, DM, Vascular disease hx), HAS-BLED 1 (ASA, prior bleed hx (hematuria, hematochezia) Duration: indefinite Supervising physician: [facility] Visit History:  Patient does not report signs/symptoms of bleeding or thromboembolism   Other recent changes: No changes in diet, medications, lifestyle.   Pt maintaining steady Vit-K intake of avocado toast 1x/week, salad 4/week.  Pt reports that she had two servings of margarita over the weekend weekend for [facility] day.    Anticoagulation Episode Summary    Current INR goal:  2.5-3.5  TTR:  63.8 % (2.3 y)  Next INR check:  08/09/2020  INR from last check:  3.6 (07/19/2020)  Weekly max warfarin dose:    Target end date:  Indefinite  INR check location:    Preferred lab:    Send INR reminders to:     Indications   H/O mitral valve replacement with mechanical valve [Z95.2] Monitoring for long-term anticoagulant use [Z51.81 Z79.01]       Comments:          Allergies  Allergen Reactions  . Iodinated Diagnostic Agents Other (See Comments)    Patient is unsure of reaction type  . Penicillins Other (See Comments)    Immune to drug , does not work per patient  Did it involve swelling of the face/tongue/throat, SOB, or low BP? No Did it involve sudden or severe rash/hives, skin peeling, or any reaction on the inside of your mouth or nose? No Did you need to seek medical attention at a hospital or doctor's office? No When did it last happen?NOT A TRUE ALLERGY If all above answers are "NO", may proceed with cephalosporin use.     Current Outpatient Medications:  .  acetaminophen (TYLENOL) 500 MG tablet, Take 500 mg by mouth every 6 (six) hours as needed  (for pain.)., Disp: , Rfl:  .  albuterol (VENTOLIN HFA) 108 (90 Base) MCG/ACT inhaler, Inhale 2 puffs into the lungs every 6 (six) hours as needed for wheezing or shortness of breath. , Disp: , Rfl:  .  amiodarone (PACERONE) 200 MG tablet, Take 1 tablet (200 mg total) by mouth daily., Disp: 60 tablet, Rfl: 1 .  aspirin EC 81 MG tablet, Take 81 mg by mouth daily. , Disp: , Rfl:  .  citalopram (CELEXA) 40 MG tablet, Take 20 mg by mouth daily., Disp: , Rfl:  .  clonazePAM (KLONOPIN) 0.5 MG tablet, Take 0.5 mg by mouth at bedtime. , Disp: , Rfl:  .  diltiazem (CARDIZEM CD) 180 MG 24 hr capsule, TAKE 1 CAPSULE(180 MG) BY MOUTH DAILY, Disp: 1 capsule, Rfl: 0 .  furosemide (LASIX) 20 MG tablet, TAKE 1 TABLET BY MOUTH DAILY, Disp: 90 tablet, Rfl: 2 .  isosorbide mononitrate (IMDUR) 60 MG 24 hr tablet, Take 1 tablet (60 mg total) by mouth daily., Disp: 90 tablet, Rfl: 3 .  Lancets (ONETOUCH DELICA PLUS [id number]) MISC, TAKE AS DIRECTED ONCE DAILY., Disp: , Rfl:  .  levalbuterol (XOPENEX HFA) 45 MCG/ACT inhaler, Inhale 1-2 puffs into the lungs every 6 (six) hours as needed for shortness of breath., Disp: 1 Inhaler, Rfl: 12 .  losartan (COZAAR) 50 MG tablet, TAKE 1 TABLET BY MOUTH DAILY, Disp: 30 tablet, Rfl: 6 .  metFORMIN (GLUCOPHAGE) 500 MG tablet,  Take 500 mg by mouth 2 (two) times daily., Disp: , Rfl:  .  metoprolol tartrate (LOPRESSOR) 100 MG tablet, TAKE 1 TABLET(100 MG) BY MOUTH TWICE DAILY, Disp: 180 tablet, Rfl: 3 .  nitroGLYCERIN (NITROSTAT) 0.4 MG SL tablet, Place 1 tablet (0.4 mg total) under the tongue every 5 (five) minutes x 3 doses as needed for chest pain., Disp: 25 tablet, Rfl: 3 .  ONETOUCH VERIO test strip, AS DIRECTED ONCE A DAY, Disp: , Rfl:  .  pantoprazole (PROTONIX) 40 MG tablet, TAKE 1 TABLET(40 MG) BY MOUTH DAILY, Disp: 90 tablet, Rfl: 1 .  rosuvastatin (CRESTOR) 40 MG tablet, Take 1 tablet (40 mg total) by mouth daily., Disp: 90 tablet, Rfl: 1 .  spironolactone (ALDACTONE) 50 MG  tablet, TAKE 1 TABLET(50 MG) BY MOUTH DAILY, Disp: 30 tablet, Rfl: 6 .  STIOLTO RESPIMAT 2.5-2.5 MCG/ACT AERS, INHALE 2 PUFFS INTO THE LUNGS DAILY, Disp: 4 g, Rfl: 5 .  warfarin (COUMADIN) 5 MG tablet, Take 1 tablet (5 mg total) by mouth daily. Refer to most recent anticoagulation note for most accurate information. (Patient taking differently: Take 5 mg by mouth daily. 7.5 mg every day or as directed by coumadin clinic), Disp: 90 tablet, Rfl: 2 .  warfarin (COUMADIN) 7.5 MG tablet, TAKE 1 TABLET BY MOUTH EVERY EVENING (Patient taking differently: 7.5 mg every day or as directed by coumadin clinic), Disp: 90 tablet, Rfl: 2 Past Medical History:  Diagnosis Date  . COVID-19   . Hyperlipidemia   . Hypertension   . Rheumatic heart disease    MS/ AS    ASSESSMENT  Recent Results: The most recent result is correlated with 50 mg per week:  Lab Results  Component Value Date   INR 3.6 (A) 07/19/2020   INR 2.7 07/05/2020   INR 2.6 06/22/2020    Anticoagulation Dosing: Description   INR slightly above goal. Continue current weekly dose of 5 mg every Wed and 7.5 mg all other days. Recheck INR in 3 week     INR today: Supratherapeutic. INR slightly above goal. Recent weekly dose increase in setting of planned increased Vit K intake. Pt staying consistent with her planned  Recent alcohol intake possibly also contributing to increased anticoagulation. Amiodarone dose continues to remain stable at 200 mg daily. Denies any relevant changes in diet, medications, or lifestyles. Denies any sign or symptoms of bleeding or bruising symptoms. Denies any signs or symptoms of thromboembolic events. Will have pt increased her avocado intake to 2-3x/week. Will continue current dose and close monitoring.   PLAN Weekly dose was unchanged by 0% to 50 mg/week. Take 5 mg every Wed and 7.5 mg all other days. Recheck INR in 3 week.    Patient Instructions  INR slightly above goal. Continue current weekly dose of 5  mg every Wed and 7.5 mg all other days. Recheck INR in 3 week  Patient advised to contact clinic or seek medical attention if signs/symptoms of bleeding or thromboembolism occur.  Patient verbalized understanding by repeating back information and was advised to contact me if further medication-related questions arise.   Follow-up Return in about 3 weeks (around 08/09/2020).  [name], PharmD  15 minutes spent face-to-face with the patient during the encounter. 50% of time spent on education, including signs/sx bleeding and clotting, as well as food and drug interactions with warfarin. 50% of time was spent on fingerprick POC INR sample collection,processing, results determination, and documentation

## 2020-08-09 ENCOUNTER — Other Ambulatory Visit: Payer: Self-pay

## 2020-08-09 ENCOUNTER — Ambulatory Visit: Payer: 59 | Admitting: Pharmacist

## 2020-08-09 DIAGNOSIS — Z5181 Encounter for therapeutic drug level monitoring: Secondary | ICD-10-CM

## 2020-08-09 DIAGNOSIS — Z952 Presence of prosthetic heart valve: Secondary | ICD-10-CM

## 2020-08-09 LAB — POCT INR: INR: 3.3 — AB (ref 2.0–3.0)

## 2020-08-09 NOTE — Patient Instructions (Signed)
INR at goal. Continue current weekly dose of 5 mg every Wed and 7.5 mg all other days. Recheck INR in 4 week

## 2020-08-09 NOTE — Progress Notes (Signed)
Anticoagulation Management Kimberly Hobbs is a 60 y.o. female who reports to the clinic for monitoring of warfarin treatment.    Indication: atrial fibrillation and Hx of Mechanical mitral and aortic valve replacement  ; CHA2DS2 Vasc Score 4 (Female, HTN, DM, Vascular disease hx), HAS-BLED 1 (ASA, prior bleed hx (hematuria, hematochezia) Duration: indefinite Supervising physician: [facility] Visit History:  Patient does not report signs/symptoms of bleeding or thromboembolism   Other recent changes: No changes in diet, medications, lifestyle.   Pt maintaining steady Vit-K intake of avocado toast 1x/week, salad 4/week.  Mild petechiae and purpura on her left ankle. Pt notes that she noticed mild lumps around her ankles that havent improved over the past few weeks. Denies any pain or itching. Pt to discuss it with her PCP if symptoms continue to persist or worsen.  Continues to have mild palpitations. Stable on amiodarone 200 mg daily.    Anticoagulation Episode Summary     Current INR goal:  2.5-3.5  TTR:  63.9 % (2.3 y)  Next INR check:  09/06/2020  INR from last check:  3.3 (08/09/2020)  Weekly max warfarin dose:    Target end date:  Indefinite  INR check location:    Preferred lab:    Send INR reminders to:     Indications   H/O mitral valve replacement with mechanical valve [Z95.2] Monitoring for long-term anticoagulant use [Z51.81 Z79.01]        Comments:           Allergies  Allergen Reactions   Iodinated Diagnostic Agents Other (See Comments)    Patient is unsure of reaction type   Penicillins Other (See Comments)    Immune to drug , does not work per patient  Did it involve swelling of the face/tongue/throat, SOB, or low BP? No Did it involve sudden or severe rash/hives, skin peeling, or any reaction on the inside of your mouth or nose? No Did you need to seek medical attention at a hospital or doctor's office? No When did it  last happen? NOT A TRUE ALLERGY   If all above answers are "NO", may proceed with cephalosporin use.     Current Outpatient Medications:    acetaminophen (TYLENOL) 500 MG tablet, Take 500 mg by mouth every 6 (six) hours as needed (for pain.)., Disp: , Rfl:    albuterol (VENTOLIN HFA) 108 (90 Base) MCG/ACT inhaler, Inhale 2 puffs into the lungs every 6 (six) hours as needed for wheezing or shortness of breath. , Disp: , Rfl:    amiodarone (PACERONE) 200 MG tablet, Take 1 tablet (200 mg total) by mouth daily., Disp: 60 tablet, Rfl: 1   aspirin EC 81 MG tablet, Take 81 mg by mouth daily. , Disp: , Rfl:    citalopram (CELEXA) 40 MG tablet, Take 20 mg by mouth daily., Disp: , Rfl:    clonazePAM (KLONOPIN) 0.5 MG tablet, Take 0.5 mg by mouth at bedtime. , Disp: , Rfl:    diltiazem (CARDIZEM CD) 180 MG 24 hr capsule, TAKE 1 CAPSULE(180 MG) BY MOUTH DAILY, Disp: 1 capsule, Rfl: 0   furosemide (LASIX) 20 MG tablet, TAKE 1 TABLET BY MOUTH DAILY, Disp: 90 tablet, Rfl: 2   isosorbide mononitrate (IMDUR) 60 MG 24 hr tablet, Take 1 tablet (60 mg total) by mouth daily., Disp: 90 tablet, Rfl: 3   Lancets (ONETOUCH DELICA PLUS [id number]) MISC, TAKE AS DIRECTED ONCE DAILY., Disp: , Rfl:    levalbuterol (XOPENEX HFA) 45  MCG/ACT inhaler, Inhale 1-2 puffs into the lungs every 6 (six) hours as needed for shortness of breath., Disp: 1 Inhaler, Rfl: 12   losartan (COZAAR) 50 MG tablet, TAKE 1 TABLET BY MOUTH DAILY, Disp: 30 tablet, Rfl: 6   metFORMIN (GLUCOPHAGE) 500 MG tablet, Take 500 mg by mouth 2 (two) times daily., Disp: , Rfl:    metoprolol tartrate (LOPRESSOR) 100 MG tablet, TAKE 1 TABLET(100 MG) BY MOUTH TWICE DAILY, Disp: 180 tablet, Rfl: 3   nitroGLYCERIN (NITROSTAT) 0.4 MG SL tablet, Place 1 tablet (0.4 mg total) under the tongue every 5 (five) minutes x 3 doses as needed for chest pain., Disp: 25 tablet, Rfl: 3   ONETOUCH VERIO test strip, AS DIRECTED ONCE A DAY, Disp: , Rfl:    pantoprazole (PROTONIX) 40  MG tablet, TAKE 1 TABLET(40 MG) BY MOUTH DAILY, Disp: 90 tablet, Rfl: 1   rosuvastatin (CRESTOR) 40 MG tablet, Take 1 tablet (40 mg total) by mouth daily., Disp: 90 tablet, Rfl: 1   spironolactone (ALDACTONE) 50 MG tablet, TAKE 1 TABLET(50 MG) BY MOUTH DAILY, Disp: 30 tablet, Rfl: 6   STIOLTO RESPIMAT 2.5-2.5 MCG/ACT AERS, INHALE 2 PUFFS INTO THE LUNGS DAILY, Disp: 4 g, Rfl: 5   warfarin (COUMADIN) 5 MG tablet, Take 1 tablet (5 mg total) by mouth daily. Refer to most recent anticoagulation note for most accurate information. (Patient taking differently: Take 5 mg by mouth daily. 7.5 mg every day or as directed by coumadin clinic), Disp: 90 tablet, Rfl: 2   warfarin (COUMADIN) 7.5 MG tablet, TAKE 1 TABLET BY MOUTH EVERY EVENING (Patient taking differently: 7.5 mg every day or as directed by coumadin clinic), Disp: 90 tablet, Rfl: 2 Past Medical History:  Diagnosis Date   COVID-19    Hyperlipidemia    Hypertension    Rheumatic heart disease    MS/ AS    ASSESSMENT  Recent Results: The most recent result is correlated with 50 mg per week:  Lab Results  Component Value Date   INR 3.3 (A) 08/09/2020   INR 3.6 (A) 07/19/2020   INR 2.7 07/05/2020    Anticoagulation Dosing: Description   INR at goal. Continue current weekly dose of 5 mg every Wed and 7.5 mg all other days. Recheck INR in 4 week     INR today: Therapeutic. Returns to therapeutic range following pt maintaining on consistent Vit K intake and avoiding any recent EtOH intake. Amiodarone dose continues to remain stable at 200 mg daily. Denies any relevant changes in diet, medications, or lifestyles. Denies any sign or symptoms of bleeding or bruising symptoms. Denies any signs or symptoms of thromboembolic events. Will continue current dose and monitoring.   PLAN Weekly dose was unchanged by 0% to 50 mg/week. Take 5 mg every Wed and 7.5 mg all other days. Recheck INR in 4 week.    Patient Instructions  INR at goal. Continue  current weekly dose of 5 mg every Wed and 7.5 mg all other days. Recheck INR in 4 week Patient advised to contact clinic or seek medical attention if signs/symptoms of bleeding or thromboembolism occur.  Patient verbalized understanding by repeating back information and was advised to contact me if further medication-related questions arise.   Follow-up Return in about 4 weeks (around 09/06/2020).  [name], PharmD  15 minutes spent face-to-face with the patient during the encounter. 50% of time spent on education, including signs/sx bleeding and clotting, as well as food and drug interactions with warfarin.  50% of time was spent on fingerprick POC INR sample collection,processing, results determination, and documentation

## 2020-08-27 ENCOUNTER — Other Ambulatory Visit: Payer: Self-pay | Admitting: Cardiology

## 2020-08-27 DIAGNOSIS — Z952 Presence of prosthetic heart valve: Secondary | ICD-10-CM

## 2020-09-01 DIAGNOSIS — E119 Type 2 diabetes mellitus without complications: Secondary | ICD-10-CM | POA: Diagnosis not present

## 2020-09-01 DIAGNOSIS — H40033 Anatomical narrow angle, bilateral: Secondary | ICD-10-CM | POA: Diagnosis not present

## 2020-09-06 ENCOUNTER — Other Ambulatory Visit: Payer: Self-pay

## 2020-09-06 ENCOUNTER — Ambulatory Visit: Payer: 59 | Admitting: Pharmacist

## 2020-09-06 DIAGNOSIS — Z5181 Encounter for therapeutic drug level monitoring: Secondary | ICD-10-CM

## 2020-09-06 DIAGNOSIS — Z952 Presence of prosthetic heart valve: Secondary | ICD-10-CM

## 2020-09-06 LAB — POCT INR: INR: 3.6 — AB (ref 2.0–3.0)

## 2020-09-06 NOTE — Patient Instructions (Signed)
INR slightly above goal. Continue current weekly dose of 5 mg every Wed and 7.5 mg all other days. Recheck INR in 2 week

## 2020-09-06 NOTE — Progress Notes (Signed)
Anticoagulation Management Kimberly Hobbs is a 60 y.o. female who reports to the clinic for monitoring of warfarin treatment.    Indication: atrial fibrillation and Hx of Mechanical mitral and aortic valve replacement  ; CHA2DS2 Vasc Score 4 (Female, HTN, DM, Vascular disease hx), HAS-BLED 1 (ASA, prior bleed hx (hematuria, hematochezia) Duration: indefinite Supervising physician: [facility] Visit History:  Patient does not report signs/symptoms of bleeding or thromboembolism   Other recent changes: No changes in diet, medications, lifestyle.   Pt maintaining steady Vit-K intake of avocado toast 1x/week, salad 4/week.  Mild petechiae and purpura on her left ankle. Pt notes that she noticed mild lumps around her ankles that havent improved over the past few weeks. Denies any pain or itching. Pt to discuss it with her PCP if symptoms continue to persist or worsen.  Stable on amiodarone 200 mg daily. Reports having periods of HR is mid 40s. Reports having 1 episode of emesis yesterday. Denies any complains of lightheadedness, dizziness, episode of syncope, worsening SOB. Pt to continue monitoring and to notify the office if symptoms worsen.     Anticoagulation Episode Summary     Current INR goal:  2.5-3.5  TTR:  64.4 % (2.4 y)  Next INR check:  09/20/2020  INR from last check:  3.6 (09/06/2020)  Weekly max warfarin dose:    Target end date:  Indefinite  INR check location:    Preferred lab:    Send INR reminders to:     Indications   H/O mitral valve replacement with mechanical valve [Z95.2] Monitoring for long-term anticoagulant use [Z51.81 Z79.01]        Comments:           Allergies  Allergen Reactions   Iodinated Diagnostic Agents Other (See Comments)    Patient is unsure of reaction type   Penicillins Other (See Comments)    Immune to drug , does not work per patient  Did it involve swelling of the face/tongue/throat, SOB, or low BP?  No Did it involve sudden or severe rash/hives, skin peeling, or any reaction on the inside of your mouth or nose? No Did you need to seek medical attention at a hospital or doctor's office? No When did it last happen? NOT A TRUE ALLERGY   If all above answers are "NO", may proceed with cephalosporin use.     Current Outpatient Medications:    acetaminophen (TYLENOL) 500 MG tablet, Take 500 mg by mouth every 6 (six) hours as needed (for pain.)., Disp: , Rfl:    albuterol (VENTOLIN HFA) 108 (90 Base) MCG/ACT inhaler, Inhale 2 puffs into the lungs every 6 (six) hours as needed for wheezing or shortness of breath. , Disp: , Rfl:    amiodarone (PACERONE) 200 MG tablet, Take 1 tablet (200 mg total) by mouth daily., Disp: 60 tablet, Rfl: 1   aspirin EC 81 MG tablet, Take 81 mg by mouth daily. , Disp: , Rfl:    citalopram (CELEXA) 40 MG tablet, Take 20 mg by mouth daily., Disp: , Rfl:    clonazePAM (KLONOPIN) 0.5 MG tablet, Take 0.5 mg by mouth at bedtime. , Disp: , Rfl:    diltiazem (CARDIZEM CD) 180 MG 24 hr capsule, TAKE ONE CAPSULE BY MOUTH DAILY, Disp: 90 capsule, Rfl: 2   furosemide (LASIX) 20 MG tablet, TAKE 1 TABLET BY MOUTH DAILY, Disp: 90 tablet, Rfl: 2   isosorbide mononitrate (IMDUR) 60 MG 24 hr tablet, Take 1 tablet (60  mg total) by mouth daily., Disp: 90 tablet, Rfl: 3   Lancets (ONETOUCH DELICA PLUS [id number]) MISC, TAKE AS DIRECTED ONCE DAILY., Disp: , Rfl:    levalbuterol (XOPENEX HFA) 45 MCG/ACT inhaler, Inhale 1-2 puffs into the lungs every 6 (six) hours as needed for shortness of breath., Disp: 1 Inhaler, Rfl: 12   losartan (COZAAR) 50 MG tablet, TAKE 1 TABLET BY MOUTH DAILY, Disp: 30 tablet, Rfl: 6   metFORMIN (GLUCOPHAGE) 500 MG tablet, Take 500 mg by mouth 2 (two) times daily., Disp: , Rfl:    metoprolol tartrate (LOPRESSOR) 100 MG tablet, TAKE 1 TABLET(100 MG) BY MOUTH TWICE DAILY, Disp: 180 tablet, Rfl: 3   nitroGLYCERIN (NITROSTAT) 0.4 MG SL tablet, Place 1 tablet (0.4 mg  total) under the tongue every 5 (five) minutes x 3 doses as needed for chest pain., Disp: 25 tablet, Rfl: 3   ONETOUCH VERIO test strip, AS DIRECTED ONCE A DAY, Disp: , Rfl:    pantoprazole (PROTONIX) 40 MG tablet, TAKE 1 TABLET(40 MG) BY MOUTH DAILY, Disp: 90 tablet, Rfl: 1   rosuvastatin (CRESTOR) 40 MG tablet, Take 1 tablet (40 mg total) by mouth daily., Disp: 90 tablet, Rfl: 1   spironolactone (ALDACTONE) 50 MG tablet, TAKE 1 TABLET(50 MG) BY MOUTH DAILY, Disp: 30 tablet, Rfl: 6   STIOLTO RESPIMAT 2.5-2.5 MCG/ACT AERS, INHALE 2 PUFFS INTO THE LUNGS DAILY, Disp: 4 g, Rfl: 5   warfarin (COUMADIN) 5 MG tablet, Take 1 tablet (5 mg total) by mouth daily. Refer to most recent anticoagulation note for most accurate information. (Patient taking differently: Take 5 mg by mouth daily. 5 mg every Wed; 7.5 mg all other days or as directed by coumadin clinic), Disp: 90 tablet, Rfl: 2   warfarin (COUMADIN) 7.5 MG tablet, TAKE 1 TABLET BY MOUTH EVERY EVENING (Patient taking differently: 5 mg every Wed; 7.5 mg all other days or as directed by coumadin clinic), Disp: 90 tablet, Rfl: 2 Past Medical History:  Diagnosis Date   COVID-19    Hyperlipidemia    Hypertension    Rheumatic heart disease    MS/ AS    ASSESSMENT  Recent Results: The most recent result is correlated with 50 mg per week:  Lab Results  Component Value Date   INR 3.6 (A) 09/06/2020   INR 3.3 (A) 08/09/2020   INR 3.6 (A) 07/19/2020    Anticoagulation Dosing: Description   INR slightly above goal. Continue current weekly dose of 5 mg every Wed and 7.5 mg all other days. Recheck INR in 2 week     INR today: Supratherapeutic. INR slightly above goal. Previously therapeutic on current weekly dose. Amiodarone dose continues to remain stable at 200 mg daily. Denies any relevant changes in diet, medications, or lifestyles. Denies any sign or symptoms of bleeding or bruising symptoms. Denies any signs or symptoms of thromboembolic  events. Will continue current dose and monitoring.   PLAN Weekly dose was unchanged by 0% to 50 mg/week. Take 5 mg every Wed and 7.5 mg all other days. Recheck INR in 2 week.    Patient Instructions  INR slightly above goal. Continue current weekly dose of 5 mg every Wed and 7.5 mg all other days. Recheck INR in 2 week Patient advised to contact clinic or seek medical attention if signs/symptoms of bleeding or thromboembolism occur.  Patient verbalized understanding by repeating back information and was advised to contact me if further medication-related questions arise.   Follow-up Return in about 2 weeks (  around 09/20/2020).  [name], PharmD  15 minutes spent face-to-face with the patient during the encounter. 50% of time spent on education, including signs/sx bleeding and clotting, as well as food and drug interactions with warfarin. 50% of time was spent on fingerprick POC INR sample collection,processing, results determination, and documentation

## 2020-09-20 ENCOUNTER — Ambulatory Visit: Payer: Medicare Other | Admitting: Pharmacist

## 2020-09-20 ENCOUNTER — Other Ambulatory Visit: Payer: Self-pay

## 2020-09-20 DIAGNOSIS — Z7901 Long term (current) use of anticoagulants: Secondary | ICD-10-CM

## 2020-09-20 DIAGNOSIS — Z952 Presence of prosthetic heart valve: Secondary | ICD-10-CM

## 2020-09-20 DIAGNOSIS — Z5181 Encounter for therapeutic drug level monitoring: Secondary | ICD-10-CM

## 2020-09-20 LAB — POCT INR: INR: 4.2 — AB (ref 2.0–3.0)

## 2020-09-20 NOTE — Progress Notes (Signed)
Anticoagulation Management Kimberly Hobbs is a 60 y.o. female who reports to the clinic for monitoring of warfarin treatment.    Indication: atrial fibrillation and Hx of Mechanical mitral and aortic valve replacement  ; CHA2DS2 Vasc Score 4 (Female, HTN, DM, Vascular disease hx), HAS-BLED 1 (ASA, prior bleed hx (hematuria, hematochezia) Duration: indefinite Supervising physician: [facility] Visit History:  Patient does not report signs/symptoms of bleeding or thromboembolism   Other recent changes: No changes in diet, medications, lifestyle.   Pt maintaining steady Vit-K intake of avocado toast 1x/week, salad 4/week.  Mild petechiae and purpura on her left ankle. Pt notes that she noticed mild lumps around her ankles that havent improved over the past few weeks. Denies any pain or itching. Pt to discuss it with her PCP if symptoms continue to persist or worsen.  Stable on amiodarone 200 mg daily. Pt reports that she got a small tattoo in her forearm. No noted bleeding complication noted. Healed appropriately. Pt reports she had lower than baseline Vit K intake, pt is planning to return to previously discussed Vit K intake goal.     Anticoagulation Episode Summary     Current INR goal:  2.5-3.5  TTR:  63.4 % (2.5 y)  Next INR check:  10/04/2020  INR from last check:  4.2 (09/20/2020)  Weekly max warfarin dose:    Target end date:  Indefinite  INR check location:    Preferred lab:    Send INR reminders to:     Indications   H/O mitral valve replacement with mechanical valve [Z95.2] Monitoring for long-term anticoagulant use [Z51.81 Z79.01]        Comments:           Allergies  Allergen Reactions   Iodinated Diagnostic Agents Other (See Comments)    Patient is unsure of reaction type   Penicillins Other (See Comments)    Immune to drug , does not work per patient  Did it involve swelling of the face/tongue/throat, SOB, or low BP? No Did it  involve sudden or severe rash/hives, skin peeling, or any reaction on the inside of your mouth or nose? No Did you need to seek medical attention at a hospital or doctor's office? No When did it last happen? NOT A TRUE ALLERGY   If all above answers are "NO", may proceed with cephalosporin use.     Current Outpatient Medications:    acetaminophen (TYLENOL) 500 MG tablet, Take 500 mg by mouth every 6 (six) hours as needed (for pain.)., Disp: , Rfl:    albuterol (VENTOLIN HFA) 108 (90 Base) MCG/ACT inhaler, Inhale 2 puffs into the lungs every 6 (six) hours as needed for wheezing or shortness of breath. , Disp: , Rfl:    amiodarone (PACERONE) 200 MG tablet, Take 1 tablet (200 mg total) by mouth daily., Disp: 60 tablet, Rfl: 1   aspirin EC 81 MG tablet, Take 81 mg by mouth daily. , Disp: , Rfl:    citalopram (CELEXA) 40 MG tablet, Take 20 mg by mouth daily., Disp: , Rfl:    clonazePAM (KLONOPIN) 0.5 MG tablet, Take 0.5 mg by mouth at bedtime. , Disp: , Rfl:    diltiazem (CARDIZEM CD) 180 MG 24 hr capsule, TAKE ONE CAPSULE BY MOUTH DAILY, Disp: 90 capsule, Rfl: 2   furosemide (LASIX) 20 MG tablet, TAKE 1 TABLET BY MOUTH DAILY, Disp: 90 tablet, Rfl: 2   isosorbide mononitrate (IMDUR) 60 MG 24 hr tablet, Take 1  tablet (60 mg total) by mouth daily., Disp: 90 tablet, Rfl: 3   Lancets (ONETOUCH DELICA PLUS Q000111Q) MISC, TAKE AS DIRECTED ONCE DAILY., Disp: , Rfl:    levalbuterol (XOPENEX HFA) 45 MCG/ACT inhaler, Inhale 1-2 puffs into the lungs every 6 (six) hours as needed for shortness of breath., Disp: 1 Inhaler, Rfl: 12   losartan (COZAAR) 50 MG tablet, TAKE 1 TABLET BY MOUTH DAILY, Disp: 30 tablet, Rfl: 6   metFORMIN (GLUCOPHAGE) 500 MG tablet, Take 500 mg by mouth 2 (two) times daily., Disp: , Rfl:    metoprolol tartrate (LOPRESSOR) 100 MG tablet, TAKE 1 TABLET(100 MG) BY MOUTH TWICE DAILY, Disp: 180 tablet, Rfl: 3   nitroGLYCERIN (NITROSTAT) 0.4 MG SL tablet, Place 1 tablet (0.4 mg total) under  the tongue every 5 (five) minutes x 3 doses as needed for chest pain., Disp: 25 tablet, Rfl: 3   ONETOUCH VERIO test strip, AS DIRECTED ONCE A DAY, Disp: , Rfl:    pantoprazole (PROTONIX) 40 MG tablet, TAKE 1 TABLET(40 MG) BY MOUTH DAILY, Disp: 90 tablet, Rfl: 1   rosuvastatin (CRESTOR) 40 MG tablet, Take 1 tablet (40 mg total) by mouth daily., Disp: 90 tablet, Rfl: 1   spironolactone (ALDACTONE) 50 MG tablet, TAKE 1 TABLET(50 MG) BY MOUTH DAILY, Disp: 30 tablet, Rfl: 6   STIOLTO RESPIMAT 2.5-2.5 MCG/ACT AERS, INHALE 2 PUFFS INTO THE LUNGS DAILY, Disp: 4 g, Rfl: 5   warfarin (COUMADIN) 5 MG tablet, Take 1 tablet (5 mg total) by mouth daily. Refer to most recent anticoagulation note for most accurate information. (Patient taking differently: Take 5 mg by mouth daily. or as directed by coumadin clinic  Currently on: 5 mg every Wed; 7.5 mg all other days), Disp: 90 tablet, Rfl: 2   warfarin (COUMADIN) 7.5 MG tablet, TAKE 1 TABLET BY MOUTH EVERY EVENING (Patient taking differently: 5 mg every Wed; 7.5 mg all other days or as directed by coumadin clinic), Disp: 90 tablet, Rfl: 2 Past Medical History:  Diagnosis Date   COVID-19    Hyperlipidemia    Hypertension    Rheumatic heart disease    MS/ AS    ASSESSMENT  Recent Results: The most recent result is correlated with 50 mg per week:  Lab Results  Component Value Date   INR 4.2 (A) 09/20/2020   INR 3.6 (A) 09/06/2020   INR 3.3 (A) 08/09/2020    Anticoagulation Dosing: Description   INR above goal. HOLD today and then continue current weekly dose of 5 mg every Wed and 7.5 mg all other days. Recheck INR in 2 week     INR today: Supratherapeutic. INR trending up on current weekly dose. Previously closely to goal. Recent decrease in Vit K intake could also contribute to supratheraputic reading. Denies any unusual bleeding or bruising symptoms. Denies any other relevant changes in diet, medications, or lifestyle. Amiodarone dose stable at 200  mg. Will hold today and continue current weekly dose in setting of pt returning to baseline Vit K intake goal. If INR continues to remain elevated at next INR check, will consider further decreasing weekly coumadin dose.   PLAN Weekly dose was unchanged by 0% to 50 mg/week. HOLD today and then continue taking 5 mg every Wed and 7.5 mg all other days. Recheck INR in 2 week.    Patient Instructions  INR above goal. HOLD today and then continue current weekly dose of 5 mg every Wed and 7.5 mg all other days. Recheck INR in  2 week Patient advised to contact clinic or seek medical attention if signs/symptoms of bleeding or thromboembolism occur.  Patient verbalized understanding by repeating back information and was advised to contact me if further medication-related questions arise.   Follow-up Return in about 2 weeks (around 10/04/2020).  [name], PharmD  15 minutes spent face-to-face with the patient during the encounter. 50% of time spent on education, including signs/sx bleeding and clotting, as well as food and drug interactions with warfarin. 50% of time was spent on fingerprick POC INR sample collection,processing, results determination, and documentation

## 2020-09-20 NOTE — Patient Instructions (Signed)
INR above goal. HOLD today and then continue current weekly dose of 5 mg every Wed and 7.5 mg all other days. Recheck INR in 2 week

## 2020-09-25 ENCOUNTER — Other Ambulatory Visit: Payer: Self-pay | Admitting: Student

## 2020-09-25 DIAGNOSIS — I480 Paroxysmal atrial fibrillation: Secondary | ICD-10-CM

## 2020-10-04 ENCOUNTER — Other Ambulatory Visit: Payer: Self-pay

## 2020-10-04 ENCOUNTER — Ambulatory Visit: Payer: Medicare Other | Admitting: Pharmacist

## 2020-10-04 DIAGNOSIS — Z952 Presence of prosthetic heart valve: Secondary | ICD-10-CM

## 2020-10-04 DIAGNOSIS — Z5181 Encounter for therapeutic drug level monitoring: Secondary | ICD-10-CM

## 2020-10-04 DIAGNOSIS — Z7901 Long term (current) use of anticoagulants: Secondary | ICD-10-CM

## 2020-10-04 LAB — POCT INR: INR: 4 — AB (ref 2.0–3.0)

## 2020-10-05 NOTE — Progress Notes (Signed)
Anticoagulation Management Kimberly Hobbs is a 60 y.o. female who reports to the clinic for monitoring of warfarin treatment.    Indication: atrial fibrillation and Hx of Mechanical mitral and aortic valve replacement  ; CHA2DS2 Vasc Score 4 (Female, HTN, DM, Vascular disease hx), HAS-BLED 1 (ASA, prior bleed hx (hematuria, hematochezia) Duration: indefinite Supervising physician: [facility] Visit History:  Patient does not report signs/symptoms of bleeding or thromboembolism   Other recent changes: No changes in diet, medications, lifestyle.   Pt maintaining steady Vit-K intake of avocado toast 1x/week, salad 4/week.  Mild petechiae and purpura on her left ankle. Pt notes that she noticed mild lumps around her ankles that havent improved over the past few weeks. Denies any pain or itching. Pt to discuss it with her PCP if symptoms continue to persist or worsen. Mild bruise/knot on her knees. Denies significant bleeding or bruising.   Stable on amiodarone 200 mg daily.    Anticoagulation Episode Summary     Current INR goal:  2.5-3.5  TTR:  62.5 % (2.5 y)  Next INR check:  10/18/2020  INR from last check:  4.0 (10/04/2020)  Weekly max warfarin dose:    Target end date:  Indefinite  INR check location:    Preferred lab:    Send INR reminders to:     Indications   H/O mitral valve replacement with mechanical valve [Z95.2] Monitoring for long-term anticoagulant use [Z51.81 Z79.01]        Comments:           Allergies  Allergen Reactions   Iodinated Diagnostic Agents Other (See Comments)    Patient is unsure of reaction type   Penicillins Other (See Comments)    Immune to drug , does not work per patient  Did it involve swelling of the face/tongue/throat, SOB, or low BP? No Did it involve sudden or severe rash/hives, skin peeling, or any reaction on the inside of your mouth or nose? No Did you need to seek medical attention at a hospital or  doctor's office? No When did it last happen? NOT A TRUE ALLERGY   If all above answers are "NO", may proceed with cephalosporin use.     Current Outpatient Medications:    acetaminophen (TYLENOL) 500 MG tablet, Take 500 mg by mouth every 6 (six) hours as needed (for pain.)., Disp: , Rfl:    albuterol (VENTOLIN HFA) 108 (90 Base) MCG/ACT inhaler, Inhale 2 puffs into the lungs every 6 (six) hours as needed for wheezing or shortness of breath. , Disp: , Rfl:    amiodarone (PACERONE) 200 MG tablet, TAKE 1 TABLET(200 MG) BY MOUTH DAILY, Disp: 60 tablet, Rfl: 1   aspirin EC 81 MG tablet, Take 81 mg by mouth daily. , Disp: , Rfl:    citalopram (CELEXA) 40 MG tablet, Take 20 mg by mouth daily., Disp: , Rfl:    clonazePAM (KLONOPIN) 0.5 MG tablet, Take 0.5 mg by mouth at bedtime. , Disp: , Rfl:    diltiazem (CARDIZEM CD) 180 MG 24 hr capsule, TAKE ONE CAPSULE BY MOUTH DAILY, Disp: 90 capsule, Rfl: 2   furosemide (LASIX) 20 MG tablet, TAKE 1 TABLET BY MOUTH DAILY, Disp: 90 tablet, Rfl: 2   isosorbide mononitrate (IMDUR) 60 MG 24 hr tablet, Take 1 tablet (60 mg total) by mouth daily., Disp: 90 tablet, Rfl: 3   Lancets (ONETOUCH DELICA PLUS Q000111Q) MISC, TAKE AS DIRECTED ONCE DAILY., Disp: , Rfl:    levalbuterol (  XOPENEX HFA) 45 MCG/ACT inhaler, Inhale 1-2 puffs into the lungs every 6 (six) hours as needed for shortness of breath., Disp: 1 Inhaler, Rfl: 12   losartan (COZAAR) 50 MG tablet, TAKE 1 TABLET BY MOUTH DAILY, Disp: 30 tablet, Rfl: 6   metFORMIN (GLUCOPHAGE) 500 MG tablet, Take 500 mg by mouth 2 (two) times daily., Disp: , Rfl:    metoprolol tartrate (LOPRESSOR) 100 MG tablet, TAKE 1 TABLET(100 MG) BY MOUTH TWICE DAILY, Disp: 180 tablet, Rfl: 3   nitroGLYCERIN (NITROSTAT) 0.4 MG SL tablet, Place 1 tablet (0.4 mg total) under the tongue every 5 (five) minutes x 3 doses as needed for chest pain., Disp: 25 tablet, Rfl: 3   ONETOUCH VERIO test strip, AS DIRECTED ONCE A DAY, Disp: , Rfl:     pantoprazole (PROTONIX) 40 MG tablet, TAKE 1 TABLET(40 MG) BY MOUTH DAILY, Disp: 90 tablet, Rfl: 1   rosuvastatin (CRESTOR) 40 MG tablet, Take 1 tablet (40 mg total) by mouth daily., Disp: 90 tablet, Rfl: 1   spironolactone (ALDACTONE) 50 MG tablet, TAKE 1 TABLET(50 MG) BY MOUTH DAILY, Disp: 30 tablet, Rfl: 6   STIOLTO RESPIMAT 2.5-2.5 MCG/ACT AERS, INHALE 2 PUFFS INTO THE LUNGS DAILY, Disp: 4 g, Rfl: 5   warfarin (COUMADIN) 5 MG tablet, Take 1 tablet (5 mg total) by mouth daily. Refer to most recent anticoagulation note for most accurate information. (Patient taking differently: Take 5 mg by mouth daily. or as directed by coumadin clinic  Currently on: 5 mg every Wed; 7.5 mg all other days), Disp: 90 tablet, Rfl: 2   warfarin (COUMADIN) 7.5 MG tablet, TAKE 1 TABLET BY MOUTH EVERY EVENING (Patient taking differently: 5 mg every Wed; 7.5 mg all other days or as directed by coumadin clinic), Disp: 90 tablet, Rfl: 2 Past Medical History:  Diagnosis Date   COVID-19    Hyperlipidemia    Hypertension    Rheumatic heart disease    MS/ AS    ASSESSMENT  Recent Results: The most recent result is correlated with 50 mg per week:  Lab Results  Component Value Date   INR 4.0 (A) 10/04/2020   INR 4.2 (A) 09/20/2020   INR 3.6 (A) 09/06/2020    Anticoagulation Dosing: Description   INR above goal. Decrease weekly dose to 5 mg every Wed and Fri and 7.5 mg all other days. Recheck INR in 2 weeks.      INR today: Supratherapeutic. INR continues to remain elevated on current weekly dose. Denies any unusual bleeding or bruising symptoms. Denies any other relevant changes in diet, medications, or lifestyle. Amiodarone dose stable at 200 mg. In setting persistently elevated INR readings, will continue further weekly dose reduction. Pt also planing on increasing her Vit K intake to 4-5 servings/week,   PLAN Weekly dose was decreased by 5% to 47.5 mg/week. Decrease weekly dose to 5 mg every Wed, Fri and 7.5  mg all other days. Increase Vit K intake to 4-5 servings/week. Recheck INR in 2 week.    Patient Instructions  INR above goal. Decrease weekly dose to 5 mg every Wed and Fri and 7.5 mg all other days. Recheck INR in 2 weeks.  Patient advised to contact clinic or seek medical attention if signs/symptoms of bleeding or thromboembolism occur.  Patient verbalized understanding by repeating back information and was advised to contact me if further medication-related questions arise.   Follow-up Return in about 2 weeks (around 10/18/2020).  [name], PharmD  15 minutes spent  face-to-face with the patient during the encounter. 50% of time spent on education, including signs/sx bleeding and clotting, as well as food and drug interactions with warfarin. 50% of time was spent on fingerprick POC INR sample collection,processing, results determination, and documentation

## 2020-10-05 NOTE — Patient Instructions (Signed)
INR above goal. Decrease weekly dose to 5 mg every Wed and Fri and 7.5 mg all other days. Recheck INR in 2 weeks.

## 2020-10-16 ENCOUNTER — Other Ambulatory Visit: Payer: Self-pay | Admitting: Cardiology

## 2020-10-16 DIAGNOSIS — I25119 Atherosclerotic heart disease of native coronary artery with unspecified angina pectoris: Secondary | ICD-10-CM

## 2020-10-30 ENCOUNTER — Ambulatory Visit: Payer: Medicare Other | Admitting: Pharmacist

## 2020-10-30 ENCOUNTER — Other Ambulatory Visit: Payer: Self-pay

## 2020-10-30 DIAGNOSIS — Z952 Presence of prosthetic heart valve: Secondary | ICD-10-CM

## 2020-10-30 DIAGNOSIS — Z7901 Long term (current) use of anticoagulants: Secondary | ICD-10-CM

## 2020-10-30 DIAGNOSIS — Z5181 Encounter for therapeutic drug level monitoring: Secondary | ICD-10-CM

## 2020-10-30 LAB — POCT INR: INR: 3.8 — AB (ref 2.0–3.0)

## 2020-10-30 NOTE — Progress Notes (Signed)
Anticoagulation Management Kimberly Hobbs is a 60 y.o. female who reports to the clinic for monitoring of warfarin treatment.    Indication: atrial fibrillation and Hx of Mechanical mitral and aortic valve replacement  ; CHA2DS2 Vasc Score 4 (Female, HTN, DM, Vascular disease hx), HAS-BLED 1 (ASA, prior bleed hx (hematuria, hematochezia) Duration: indefinite Supervising physician: [facility] Visit History:  Patient does not report signs/symptoms of bleeding or thromboembolism   Other recent changes: No changes in diet, medications, lifestyle.   Pt maintaining steady Vit-K intake of avocado toast 1x/week, salad 4/week.  Pt reviewed with PCP regarding concerns of mild petechiae and purpura symptoms. Also discussed knots in her legs. Was told to continue monitoring. Pt reports that she only has been only eating salads and hadnt had any broccoli or avocado over the past week.   Stable on amiodarone 200 mg daily. Pt reports that she recently got lab works completed through PCP office today. Getting her TSH levels checked.    Anticoagulation Episode Summary     Current INR goal:  2.5-3.5  TTR:  60.7 % (2.6 y)  Next INR check:  11/20/2020  INR from last check:  3.8 (10/30/2020)  Weekly max warfarin dose:    Target end date:  Indefinite  INR check location:    Preferred lab:    Send INR reminders to:     Indications   H/O mitral valve replacement with mechanical valve [Z95.2] Monitoring for long-term anticoagulant use [Z51.81 Z79.01]        Comments:           Allergies  Allergen Reactions   Iodinated Diagnostic Agents Other (See Comments)    Patient is unsure of reaction type   Penicillins Other (See Comments)    Immune to drug , does not work per patient  Did it involve swelling of the face/tongue/throat, SOB, or low BP? No Did it involve sudden or severe rash/hives, skin peeling, or any reaction on the inside of your mouth or nose? No Did  you need to seek medical attention at a hospital or doctor's office? No When did it last happen? NOT A TRUE ALLERGY   If all above answers are "NO", may proceed with cephalosporin use.     Current Outpatient Medications:    acetaminophen (TYLENOL) 500 MG tablet, Take 500 mg by mouth every 6 (six) hours as needed (for pain.)., Disp: , Rfl:    albuterol (VENTOLIN HFA) 108 (90 Base) MCG/ACT inhaler, Inhale 2 puffs into the lungs every 6 (six) hours as needed for wheezing or shortness of breath. , Disp: , Rfl:    amiodarone (PACERONE) 200 MG tablet, TAKE 1 TABLET(200 MG) BY MOUTH DAILY, Disp: 60 tablet, Rfl: 1   aspirin EC 81 MG tablet, Take 81 mg by mouth daily. , Disp: , Rfl:    citalopram (CELEXA) 40 MG tablet, Take 20 mg by mouth daily., Disp: , Rfl:    clonazePAM (KLONOPIN) 0.5 MG tablet, Take 0.5 mg by mouth at bedtime. , Disp: , Rfl:    diltiazem (CARDIZEM CD) 180 MG 24 hr capsule, TAKE ONE CAPSULE BY MOUTH DAILY, Disp: 90 capsule, Rfl: 2   furosemide (LASIX) 20 MG tablet, TAKE 1 TABLET BY MOUTH DAILY, Disp: 90 tablet, Rfl: 2   isosorbide mononitrate (IMDUR) 60 MG 24 hr tablet, Take 1 tablet (60 mg total) by mouth daily., Disp: 90 tablet, Rfl: 3   Lancets (ONETOUCH DELICA PLUS Q000111Q) MISC, TAKE AS DIRECTED ONCE DAILY.,  Disp: , Rfl:    levalbuterol (XOPENEX HFA) 45 MCG/ACT inhaler, Inhale 1-2 puffs into the lungs every 6 (six) hours as needed for shortness of breath., Disp: 1 Inhaler, Rfl: 12   losartan (COZAAR) 50 MG tablet, TAKE 1 TABLET BY MOUTH DAILY, Disp: 30 tablet, Rfl: 6   metFORMIN (GLUCOPHAGE) 500 MG tablet, Take 500 mg by mouth 2 (two) times daily., Disp: , Rfl:    metoprolol tartrate (LOPRESSOR) 100 MG tablet, TAKE 1 TABLET(100 MG) BY MOUTH TWICE DAILY, Disp: 180 tablet, Rfl: 3   nitroGLYCERIN (NITROSTAT) 0.4 MG SL tablet, Place 1 tablet (0.4 mg total) under the tongue every 5 (five) minutes x 3 doses as needed for chest pain., Disp: 25 tablet, Rfl: 3   ONETOUCH VERIO test  strip, AS DIRECTED ONCE A DAY, Disp: , Rfl:    pantoprazole (PROTONIX) 40 MG tablet, TAKE 1 TABLET(40 MG) BY MOUTH DAILY, Disp: 90 tablet, Rfl: 1   rosuvastatin (CRESTOR) 40 MG tablet, Take 1 tablet (40 mg total) by mouth daily., Disp: 90 tablet, Rfl: 1   spironolactone (ALDACTONE) 50 MG tablet, TAKE 1 TABLET(50 MG) BY MOUTH DAILY, Disp: 30 tablet, Rfl: 6   STIOLTO RESPIMAT 2.5-2.5 MCG/ACT AERS, INHALE 2 PUFFS INTO THE LUNGS DAILY, Disp: 4 g, Rfl: 5   warfarin (COUMADIN) 5 MG tablet, Take 1 tablet (5 mg total) by mouth daily. Refer to most recent anticoagulation note for most accurate information. (Patient taking differently: Take 5 mg by mouth daily. or as directed by coumadin clinic  Currently on: 5 mg every Wed; 7.5 mg all other days), Disp: 90 tablet, Rfl: 2   warfarin (COUMADIN) 7.5 MG tablet, TAKE 1 TABLET BY MOUTH EVERY EVENING (Patient taking differently: 5 mg every Wed; 7.5 mg all other days or as directed by coumadin clinic), Disp: 90 tablet, Rfl: 2 Past Medical History:  Diagnosis Date   COVID-19    Hyperlipidemia    Hypertension    Rheumatic heart disease    MS/ AS    ASSESSMENT  Recent Results: The most recent result is correlated with 47.5 mg per week:  Lab Results  Component Value Date   INR 3.8 (A) 10/30/2020   INR 4.0 (A) 10/04/2020   INR 4.2 (A) 09/20/2020    Anticoagulation Dosing: Description   INR above goal. Continue current weekly dose of 5 mg every Wed and Fri and 7.5 mg all other days. Recheck INR in 3 weeks. High Vit K goal of 3-4 servings/week     INR today: Supratherapeutic. INR still elevated despite recent weekly dose decrease. Pt reports that she didn't have any high Vit K intake as previous baseline. Pt agreeable to return to previous goal f 3-4 servings of high Vit K dietary intake per week moving forward. Denies any unusual bleeding or bruising symptoms. Denies any other relevant changes in diet, medications, or lifestyle. Amiodarone dose stable at  200 mg. Will continue current weekly dose and continue close monitoring. If INR remains elevated at next INR check, will consider further weekly dose decrease.   PLAN Weekly dose was unchanged by 0% to 47.5 mg/week.Continue current weekly dose of 5 mg every Wed, Fri and 7.5 mg all other days. Maintain Vit K intake of 4-5 servings/week. Recheck INR in 3 week.    Patient Instructions  INR above goal. Continue current weekly dose of 5 mg every Wed and Fri and 7.5 mg all other days. Recheck INR in 3 weeks. High Vit K goal of 3-4 servings/week Patient  advised to contact clinic or seek medical attention if signs/symptoms of bleeding or thromboembolism occur.  Patient verbalized understanding by repeating back information and was advised to contact me if further medication-related questions arise.   Follow-up Return in about 3 weeks (around 11/20/2020).  [name], PharmD  15 minutes spent face-to-face with the patient during the encounter. 50% of time spent on education, including signs/sx bleeding and clotting, as well as food and drug interactions with warfarin. 50% of time was spent on fingerprick POC INR sample collection,processing, results determination, and documentation

## 2020-10-30 NOTE — Patient Instructions (Addendum)
INR above goal. Continue current weekly dose of 5 mg every Wed and Fri and 7.5 mg all other days. Recheck INR in 3 weeks. High Vit K goal of 3-4 servings/week

## 2020-11-20 ENCOUNTER — Other Ambulatory Visit: Payer: Self-pay

## 2020-11-20 ENCOUNTER — Ambulatory Visit: Payer: Medicare Other | Admitting: Pharmacist

## 2020-11-20 DIAGNOSIS — Z5181 Encounter for therapeutic drug level monitoring: Secondary | ICD-10-CM

## 2020-11-20 DIAGNOSIS — I480 Paroxysmal atrial fibrillation: Secondary | ICD-10-CM

## 2020-11-20 DIAGNOSIS — Z952 Presence of prosthetic heart valve: Secondary | ICD-10-CM

## 2020-11-20 LAB — POCT INR: INR: 4.2 — AB (ref 2.0–3.0)

## 2020-11-20 NOTE — Patient Instructions (Signed)
INR above goal. Decrease weekly dose to 7.5 mg every Tues, Thurs, Sat, and 5 mg all other days. Recheck INR in 3 weeks. High Vit K goal of 3-4 servings/week

## 2020-11-20 NOTE — Progress Notes (Signed)
Anticoagulation Management Kimberly Hobbs is a 60 y.o. female who reports to the clinic for monitoring of warfarin treatment.    Indication: atrial fibrillation and Hx of Mechanical mitral and aortic valve replacement  ; CHA2DS2 Vasc Score 4 (Female, HTN, DM, Vascular disease hx), HAS-BLED 1 (ASA, prior bleed hx (hematuria, hematochezia) Duration: indefinite Supervising physician: [facility] Visit History:  Patient does not report signs/symptoms of bleeding or thromboembolism   Other recent changes: No changes in diet, medications, lifestyle.   Pt maintaining steady Vit-K intake of spinach salad 3-4/week.  Stable on amiodarone 200 mg daily. Pt reports that she got labs completed through PCP's office. Called to request labs from PCP office.    Anticoagulation Episode Summary     Current INR goal:  2.5-3.5  TTR:  59.4 % (2.6 y)  Next INR check:  12/11/2020  INR from last check:  4.2 (11/20/2020)  Weekly max warfarin dose:    Target end date:  Indefinite  INR check location:    Preferred lab:    Send INR reminders to:     Indications   H/O mitral valve replacement with mechanical valve [Z95.2] Monitoring for long-term anticoagulant use [Z51.81 Z79.01]        Comments:           Allergies  Allergen Reactions   Iodinated Diagnostic Agents Other (See Comments)    Patient is unsure of reaction type   Penicillins Other (See Comments)    Immune to drug , does not work per patient  Did it involve swelling of the face/tongue/throat, SOB, or low BP? No Did it involve sudden or severe rash/hives, skin peeling, or any reaction on the inside of your mouth or nose? No Did you need to seek medical attention at a hospital or doctor's office? No When did it last happen? NOT A TRUE ALLERGY   If all above answers are "NO", may proceed with cephalosporin use.     Current Outpatient Medications:    acetaminophen (TYLENOL) 500 MG tablet, Take 500 mg by  mouth every 6 (six) hours as needed (for pain.)., Disp: , Rfl:    albuterol (VENTOLIN HFA) 108 (90 Base) MCG/ACT inhaler, Inhale 2 puffs into the lungs every 6 (six) hours as needed for wheezing or shortness of breath. , Disp: , Rfl:    amiodarone (PACERONE) 200 MG tablet, TAKE 1 TABLET(200 MG) BY MOUTH DAILY, Disp: 60 tablet, Rfl: 1   aspirin EC 81 MG tablet, Take 81 mg by mouth daily. , Disp: , Rfl:    citalopram (CELEXA) 40 MG tablet, Take 20 mg by mouth daily., Disp: , Rfl:    clonazePAM (KLONOPIN) 0.5 MG tablet, Take 0.5 mg by mouth at bedtime. , Disp: , Rfl:    diltiazem (CARDIZEM CD) 180 MG 24 hr capsule, TAKE ONE CAPSULE BY MOUTH DAILY, Disp: 90 capsule, Rfl: 2   furosemide (LASIX) 20 MG tablet, TAKE 1 TABLET BY MOUTH DAILY, Disp: 90 tablet, Rfl: 2   isosorbide mononitrate (IMDUR) 60 MG 24 hr tablet, Take 1 tablet (60 mg total) by mouth daily., Disp: 90 tablet, Rfl: 3   Lancets (ONETOUCH DELICA PLUS [id number]) MISC, TAKE AS DIRECTED ONCE DAILY., Disp: , Rfl:    levalbuterol (XOPENEX HFA) 45 MCG/ACT inhaler, Inhale 1-2 puffs into the lungs every 6 (six) hours as needed for shortness of breath., Disp: 1 Inhaler, Rfl: 12   losartan (COZAAR) 50 MG tablet, TAKE 1 TABLET BY MOUTH DAILY, Disp: 30 tablet,  Rfl: 6   metFORMIN (GLUCOPHAGE) 500 MG tablet, Take 500 mg by mouth 2 (two) times daily., Disp: , Rfl:    metoprolol tartrate (LOPRESSOR) 100 MG tablet, TAKE 1 TABLET(100 MG) BY MOUTH TWICE DAILY, Disp: 180 tablet, Rfl: 3   nitroGLYCERIN (NITROSTAT) 0.4 MG SL tablet, Place 1 tablet (0.4 mg total) under the tongue every 5 (five) minutes x 3 doses as needed for chest pain., Disp: 25 tablet, Rfl: 3   ONETOUCH VERIO test strip, AS DIRECTED ONCE A DAY, Disp: , Rfl:    pantoprazole (PROTONIX) 40 MG tablet, TAKE 1 TABLET(40 MG) BY MOUTH DAILY, Disp: 90 tablet, Rfl: 1   rosuvastatin (CRESTOR) 40 MG tablet, Take 1 tablet (40 mg total) by mouth daily., Disp: 90 tablet, Rfl: 1   spironolactone (ALDACTONE) 50  MG tablet, TAKE 1 TABLET(50 MG) BY MOUTH DAILY, Disp: 30 tablet, Rfl: 6   STIOLTO RESPIMAT 2.5-2.5 MCG/ACT AERS, INHALE 2 PUFFS INTO THE LUNGS DAILY, Disp: 4 g, Rfl: 5   warfarin (COUMADIN) 5 MG tablet, Take 1 tablet (5 mg total) by mouth daily. Refer to most recent anticoagulation note for most accurate information. (Patient taking differently: Take 5 mg by mouth daily. or as directed by coumadin clinic  Currently on: 5 mg every Wed; 7.5 mg all other days), Disp: 90 tablet, Rfl: 2   warfarin (COUMADIN) 7.5 MG tablet, TAKE 1 TABLET BY MOUTH EVERY EVENING (Patient taking differently: 5 mg every Wed; 7.5 mg all other days or as directed by coumadin clinic), Disp: 90 tablet, Rfl: 2 Past Medical History:  Diagnosis Date   COVID-19    Hyperlipidemia    Hypertension    Rheumatic heart disease    MS/ AS    ASSESSMENT  Recent Results: The most recent result is correlated with 47.5 mg per week:  Lab Results  Component Value Date   INR 4.2 (A) 11/20/2020   INR 3.8 (A) 10/30/2020   INR 4.0 (A) 10/04/2020    Anticoagulation Dosing: Description   INR above goal. Decrease weekly dose to 7.5 mg every Tues, Thurs, Sat, and 5 mg all other days. Recheck INR in 3 weeks. High Vit K goal of 3-4 servings/week     INR today: Supratherapeutic. INR continues to remain elevated on current weekly dose. Previously decrease weekly dose at 2 visits ago. Pt verbalized compliance and adherence to prescribed weekly warfarin dose. Denies any unusual bleeding or bruising symptoms. Denies any relevant changes in diet, medications, or lifestyle. Amiodarone dose stable at 200 mg. In setting of unexplained persistent supratherapeutic INR reading, will decrease weekly dose and continue close monitoring and follow-up.   PLAN Weekly dose was decreased by 10.5 % to 42.5 mg/week. Decrease weekly dose to 7.5 mg every Tues, Thurs, Sat and 5 mg all other days. Maintain Vit K intake of 3-4 servings/week. Recheck INR in 3 week.     Patient Instructions  INR above goal. Decrease weekly dose to 7.5 mg every Tues, Thurs, Sat, and 5 mg all other days. Recheck INR in 3 weeks. High Vit K goal of 3-4 servings/week Patient advised to contact clinic or seek medical attention if signs/symptoms of bleeding or thromboembolism occur.  Patient verbalized understanding by repeating back information and was advised to contact me if further medication-related questions arise.   Follow-up Return in about 3 weeks (around 12/11/2020).  [name], PharmD  15 minutes spent face-to-face with the patient during the encounter. 50% of time spent on education, including signs/sx bleeding and  clotting, as well as food and drug interactions with warfarin. 50% of time was spent on fingerprick POC INR sample collection,processing, results determination, and documentation

## 2020-11-27 ENCOUNTER — Other Ambulatory Visit: Payer: Self-pay | Admitting: Student

## 2020-11-29 ENCOUNTER — Other Ambulatory Visit: Payer: Self-pay

## 2020-11-29 ENCOUNTER — Ambulatory Visit: Payer: Medicare Other | Admitting: Pharmacist

## 2020-11-29 DIAGNOSIS — Z7901 Long term (current) use of anticoagulants: Secondary | ICD-10-CM

## 2020-11-29 DIAGNOSIS — Z5181 Encounter for therapeutic drug level monitoring: Secondary | ICD-10-CM

## 2020-11-29 DIAGNOSIS — Z952 Presence of prosthetic heart valve: Secondary | ICD-10-CM

## 2020-11-29 LAB — POCT INR: INR: 2.2 (ref 2.0–3.0)

## 2020-11-29 NOTE — Patient Instructions (Signed)
INR below goal. Take 7.5 mg on 10/15 and 5 mg all other days. Recheck INR in 5 days.

## 2020-11-29 NOTE — Progress Notes (Signed)
Anticoagulation Management Kimberly Hobbs is a 60 y.o. female who reports to the clinic for monitoring of warfarin treatment.    Indication: atrial fibrillation and Hx of Mechanical mitral and aortic valve replacement  ; CHA2DS2 Vasc Score 4 (Female, HTN, DM, Vascular disease hx), HAS-BLED 1 (ASA, prior bleed hx (hematuria, hematochezia) Duration: indefinite Supervising physician: [facility] Visit History:  Patient does not report signs/symptoms of bleeding or thromboembolism   Other recent changes: No changes in diet, medications, lifestyle.   Pt maintaining steady Vit-K intake of spinach salad 3-4/week.  Stable on amiodarone 200 mg daily. Pt reports that she got labs completed through PCP's office. Called to request labs from PCP office.   Recent ER admission on 11/26/20 for concerns of right calf pain. Bedside US showed possible complex cystic mass vs abscess. Pt was started on 5 day course of doxycycline  to address possible infection. Expected end date ~12/01/20. Pt is schedule to undergo follow up US to determine further management.   INR upon ER admission of 6.05. Reviewed with pt and was told to hold warfarin for 2 days. Pt took 5 mg on 10/11 instead of regular 7.5 mg as directed.    Anticoagulation Episode Summary     Current INR goal:  2.5-3.5  TTR:  59.3 % (2.6 y)  Next INR check:  12/04/2020  INR from last check:  2.2 (11/29/2020)  Weekly max warfarin dose:    Target end date:  Indefinite  INR check location:    Preferred lab:    Send INR reminders to:     Indications   H/O mitral valve replacement with mechanical valve [Z95.2] Monitoring for long-term anticoagulant use [Z51.81 Z79.01]        Comments:           Allergies  Allergen Reactions   Iodinated Diagnostic Agents Other (See Comments)    Patient is unsure of reaction type   Penicillins Other (See Comments)    Immune to drug , does not work per patient  Did it involve  swelling of the face/tongue/throat, SOB, or low BP? No Did it involve sudden or severe rash/hives, skin peeling, or any reaction on the inside of your mouth or nose? No Did you need to seek medical attention at a hospital or doctor's office? No When did it last happen? NOT A TRUE ALLERGY   If all above answers are "NO", may proceed with cephalosporin use.     Current Outpatient Medications:    doxycycline (VIBRAMYCIN) 100 MG capsule, Take 1 capsule by mouth in the morning and at bedtime. For 5 days, Disp: , Rfl:    acetaminophen (TYLENOL) 500 MG tablet, Take 500 mg by mouth every 6 (six) hours as needed (for pain.)., Disp: , Rfl:    albuterol (VENTOLIN HFA) 108 (90 Base) MCG/ACT inhaler, Inhale 2 puffs into the lungs every 6 (six) hours as needed for wheezing or shortness of breath. , Disp: , Rfl:    amiodarone (PACERONE) 200 MG tablet, TAKE 1 TABLET(200 MG) BY MOUTH DAILY, Disp: 60 tablet, Rfl: 1   aspirin EC 81 MG tablet, Take 81 mg by mouth daily. , Disp: , Rfl:    citalopram (CELEXA) 40 MG tablet, Take 20 mg by mouth daily., Disp: , Rfl:    clonazePAM (KLONOPIN) 0.5 MG tablet, Take 0.5 mg by mouth at bedtime. , Disp: , Rfl:    diltiazem (CARDIZEM CD) 180 MG 24 hr capsule, TAKE ONE CAPSULE BY MOUTH  DAILY, Disp: 90 capsule, Rfl: 2   furosemide (LASIX) 20 MG tablet, TAKE 1 TABLET BY MOUTH DAILY, Disp: 90 tablet, Rfl: 2   isosorbide mononitrate (IMDUR) 60 MG 24 hr tablet, Take 1 tablet (60 mg total) by mouth daily., Disp: 90 tablet, Rfl: 3   Lancets (ONETOUCH DELICA PLUS [id number]) MISC, TAKE AS DIRECTED ONCE DAILY., Disp: , Rfl:    levalbuterol (XOPENEX HFA) 45 MCG/ACT inhaler, Inhale 1-2 puffs into the lungs every 6 (six) hours as needed for shortness of breath., Disp: 1 Inhaler, Rfl: 12   losartan (COZAAR) 50 MG tablet, TAKE 1 TABLET BY MOUTH DAILY, Disp: 30 tablet, Rfl: 6   metFORMIN (GLUCOPHAGE) 500 MG tablet, Take 500 mg by mouth 2 (two) times daily., Disp: , Rfl:    metoprolol tartrate  (LOPRESSOR) 100 MG tablet, TAKE 1 TABLET(100 MG) BY MOUTH TWICE DAILY, Disp: 180 tablet, Rfl: 3   nitroGLYCERIN (NITROSTAT) 0.4 MG SL tablet, Place 1 tablet (0.4 mg total) under the tongue every 5 (five) minutes x 3 doses as needed for chest pain., Disp: 25 tablet, Rfl: 3   ONETOUCH VERIO test strip, AS DIRECTED ONCE A DAY, Disp: , Rfl:    pantoprazole (PROTONIX) 40 MG tablet, TAKE 1 TABLET(40 MG) BY MOUTH DAILY, Disp: 90 tablet, Rfl: 1   rosuvastatin (CRESTOR) 40 MG tablet, Take 1 tablet (40 mg total) by mouth daily., Disp: 90 tablet, Rfl: 1   spironolactone (ALDACTONE) 50 MG tablet, TAKE 1 TABLET(50 MG) BY MOUTH DAILY, Disp: 30 tablet, Rfl: 6   STIOLTO RESPIMAT 2.5-2.5 MCG/ACT AERS, INHALE 2 PUFFS INTO THE LUNGS DAILY, Disp: 4 g, Rfl: 5   warfarin (COUMADIN) 5 MG tablet, Take 1 tablet (5 mg total) by mouth daily. Refer to most recent anticoagulation note for most accurate information. (Patient taking differently: Take 5 mg by mouth daily. or as directed by coumadin clinic  Currently on: 5 mg every Wed; 7.5 mg all other days), Disp: 90 tablet, Rfl: 2   warfarin (COUMADIN) 7.5 MG tablet, TAKE 1 TABLET BY MOUTH EVERY EVENING (Patient taking differently: 5 mg every Wed; 7.5 mg all other days or as directed by coumadin clinic), Disp: 90 tablet, Rfl: 2 Past Medical History:  Diagnosis Date   COVID-19    Hyperlipidemia    Hypertension    Rheumatic heart disease    MS/ AS    ASSESSMENT  Recent Results: The most recent result is correlated with 30 mg per week:  Lab Results  Component Value Date   INR 2.2 11/29/2020   INR 4.2 (A) 11/20/2020   INR 3.8 (A) 10/30/2020    Anticoagulation Dosing: Description   INR below goal. Take 7.5 mg on 10/15 and 5 mg all other days. Recheck INR in 5 days.      INR today: Subtherapeutic. Recent labile INR readings. INR >5 at ER check. Recent weekly dose decrease. New start doxycycline that is known to cause increased anticoagulation effect. Denies any  unusual bleeding or bruising symptoms. Denies any other relevant change sin diet, medications, or lifestyle. Amiodarone dose stable at 200 mg. Weekly warfarin dose last week of 30 mg/week. Will continue close monitoring and dose adjust in setting of labile INR.   PLAN Weekly dose was unchanged by 0 % to 42.5 mg/week. Take 7.5 mg on 10/15 and 5 mg all other days. Recheck INR in 5 days.   Patient Instructions  INR below goal. Take 7.5 mg on 10/15 and 5 mg all other days. Recheck INR in  5 days.  Patient advised to contact clinic or seek medical attention if signs/symptoms of bleeding or thromboembolism occur.  Patient verbalized understanding by repeating back information and was advised to contact me if further medication-related questions arise.   Follow-up Return in about 5 days (around 12/04/2020).  [name], PharmD  15 minutes spent face-to-face with the patient during the encounter. 50% of time spent on education, including signs/sx bleeding and clotting, as well as food and drug interactions with warfarin. 50% of time was spent on fingerprick POC INR sample collection,processing, results determination, and documentation

## 2020-11-29 NOTE — Telephone Encounter (Signed)
From patient.

## 2020-11-30 LAB — BASIC METABOLIC PANEL
BUN: 30 — AB (ref 4–21)
CO2: 25 — AB (ref 13–22)
Chloride: 105 (ref 99–108)
Creatinine: 1.4 — AB (ref <=1.1)
Potassium: 4.5 (ref 3.4–5.3)
Sodium: 139 (ref 137–147)

## 2020-11-30 LAB — COMPREHENSIVE METABOLIC PANEL
Albumin: 4.8 (ref 3.5–5.0)
Calcium: 9.5 (ref 8.7–10.7)

## 2020-12-04 ENCOUNTER — Other Ambulatory Visit: Payer: Self-pay

## 2020-12-04 ENCOUNTER — Ambulatory Visit: Payer: Medicare Other | Admitting: Pharmacist

## 2020-12-04 DIAGNOSIS — Z5181 Encounter for therapeutic drug level monitoring: Secondary | ICD-10-CM

## 2020-12-04 DIAGNOSIS — Z952 Presence of prosthetic heart valve: Secondary | ICD-10-CM

## 2020-12-04 LAB — POCT INR: INR: 2.3 (ref 2.0–3.0)

## 2020-12-04 NOTE — Patient Instructions (Signed)
INR below goal. Decrease weekly dose to 7.5 mg every Mon, Wed and 5 mg all other days. Recheck INR in 10 days.

## 2020-12-04 NOTE — Progress Notes (Signed)
Anticoagulation Management Kimberly Hobbs is a 60 y.o. female who reports to the clinic for monitoring of warfarin treatment.    Indication: atrial fibrillation and Hx of Mechanical mitral and aortic valve replacement  ; CHA2DS2 Vasc Score 4 (Female, HTN, DM, Vascular disease hx), HAS-BLED 1 (ASA, prior bleed hx (hematuria, hematochezia) Duration: indefinite Supervising physician: [facility] Visit History:  Patient does not report signs/symptoms of bleeding or thromboembolism   Other recent changes: No changes in diet, medications, lifestyle.   Pt maintaining steady Vit-K intake of spinach salad 3-4/week.  Stable on amiodarone 200 mg daily. Pt reports that she got labs completed through PCP's office. Called to request labs from PCP office. Labs scanned in.   Recent ER admission on 11/26/20 for concerns of right calf pain. Finished course of doxycycline ~12/01/20. Follow up US on 11/29/20 showed Nonspecific small heterogenous mass/collection within the gastrocnemius muscle. Recommend follow-up contrast enhanced MRI for further characterization. Pt never followed up with MRI imaging. Reports that swelling has resolved since without further complications.   Anticoagulation Episode Summary     Current INR goal:  2.5-3.5  TTR:  59.0 % (2.7 y)  Next INR check:  12/13/2020  INR from last check:  2.3 (12/04/2020)  Weekly max warfarin dose:    Target end date:  Indefinite  INR check location:    Preferred lab:    Send INR reminders to:     Indications   H/O mitral valve replacement with mechanical valve [Z95.2] Monitoring for long-term anticoagulant use [Z51.81 Z79.01]        Comments:           Allergies  Allergen Reactions   Iodinated Diagnostic Agents Other (See Comments)    Patient is unsure of reaction type   Penicillins Other (See Comments)    Immune to drug , does not work per patient  Did it involve swelling of the face/tongue/throat,  SOB, or low BP? No Did it involve sudden or severe rash/hives, skin peeling, or any reaction on the inside of your mouth or nose? No Did you need to seek medical attention at a hospital or doctor's office? No When did it last happen? NOT A TRUE ALLERGY   If all above answers are "NO", may proceed with cephalosporin use.     Current Outpatient Medications:    acetaminophen (TYLENOL) 500 MG tablet, Take 500 mg by mouth every 6 (six) hours as needed (for pain.)., Disp: , Rfl:    albuterol (VENTOLIN HFA) 108 (90 Base) MCG/ACT inhaler, Inhale 2 puffs into the lungs every 6 (six) hours as needed for wheezing or shortness of breath. , Disp: , Rfl:    amiodarone (PACERONE) 200 MG tablet, TAKE 1 TABLET(200 MG) BY MOUTH DAILY, Disp: 60 tablet, Rfl: 1   aspirin EC 81 MG tablet, Take 81 mg by mouth daily. , Disp: , Rfl:    citalopram (CELEXA) 40 MG tablet, Take 20 mg by mouth daily., Disp: , Rfl:    clonazePAM (KLONOPIN) 0.5 MG tablet, Take 0.5 mg by mouth at bedtime. , Disp: , Rfl:    diltiazem (CARDIZEM CD) 180 MG 24 hr capsule, TAKE ONE CAPSULE BY MOUTH DAILY, Disp: 90 capsule, Rfl: 2   furosemide (LASIX) 20 MG tablet, TAKE 1 TABLET BY MOUTH DAILY, Disp: 90 tablet, Rfl: 2   isosorbide mononitrate (IMDUR) 60 MG 24 hr tablet, Take 1 tablet (60 mg total) by mouth daily., Disp: 90 tablet, Rfl: 3   Lancets (ONETOUCH  DELICA PLUS [id number]) MISC, TAKE AS DIRECTED ONCE DAILY., Disp: , Rfl:    levalbuterol (XOPENEX HFA) 45 MCG/ACT inhaler, Inhale 1-2 puffs into the lungs every 6 (six) hours as needed for shortness of breath., Disp: 1 Inhaler, Rfl: 12   losartan (COZAAR) 50 MG tablet, TAKE 1 TABLET BY MOUTH DAILY, Disp: 30 tablet, Rfl: 6   metFORMIN (GLUCOPHAGE) 500 MG tablet, Take 500 mg by mouth 2 (two) times daily., Disp: , Rfl:    metoprolol tartrate (LOPRESSOR) 100 MG tablet, TAKE 1 TABLET(100 MG) BY MOUTH TWICE DAILY, Disp: 180 tablet, Rfl: 3   nitroGLYCERIN (NITROSTAT) 0.4 MG SL tablet, Place 1 tablet  (0.4 mg total) under the tongue every 5 (five) minutes x 3 doses as needed for chest pain., Disp: 25 tablet, Rfl: 3   ONETOUCH VERIO test strip, AS DIRECTED ONCE A DAY, Disp: , Rfl:    pantoprazole (PROTONIX) 40 MG tablet, TAKE 1 TABLET(40 MG) BY MOUTH DAILY, Disp: 90 tablet, Rfl: 1   rosuvastatin (CRESTOR) 40 MG tablet, Take 1 tablet (40 mg total) by mouth daily., Disp: 90 tablet, Rfl: 1   spironolactone (ALDACTONE) 50 MG tablet, TAKE 1 TABLET(50 MG) BY MOUTH DAILY, Disp: 30 tablet, Rfl: 6   STIOLTO RESPIMAT 2.5-2.5 MCG/ACT AERS, INHALE 2 PUFFS INTO THE LUNGS DAILY, Disp: 4 g, Rfl: 5   warfarin (COUMADIN) 5 MG tablet, Take 1 tablet (5 mg total) by mouth daily. Refer to most recent anticoagulation note for most accurate information. (Patient taking differently: Take 5 mg by mouth daily. or as directed by coumadin clinic  Currently on: 5 mg every Wed; 7.5 mg all other days), Disp: 90 tablet, Rfl: 2   warfarin (COUMADIN) 7.5 MG tablet, TAKE 1 TABLET BY MOUTH EVERY EVENING (Patient taking differently: 5 mg every Wed; 7.5 mg all other days or as directed by coumadin clinic), Disp: 90 tablet, Rfl: 2 Past Medical History:  Diagnosis Date   COVID-19    Hyperlipidemia    Hypertension    Rheumatic heart disease    MS/ AS    ASSESSMENT  Recent Results: The most recent result is correlated with 32.5 mg per week:  Lab Results  Component Value Date   INR 2.3 12/04/2020   INR 2.2 11/29/2020   INR 4.2 (A) 11/20/2020    Anticoagulation Dosing: Description   INR below goal. Decrease weekly dose to 7.5 mg every Mon, Wed and 5 mg all other days. Recheck INR in 10 days.      INR today: Subtherapeutic. Recent labile INR readings. Recent completion of doxycyline course. Weekly dose over the past 5 days of 27.5 mg. Denies any unusual bleeding or bruising symptoms. Denies any other relevant change sin diet, medications, or lifestyle. Amiodarone dose stable at 200 mg. INR unchanged with weekly dose of 32.5.  Will consider increase weekly dose to 40 mg/week.    PLAN Weekly dose was decreased by 5.9 % to 40 mg/week. Increase weekly dose to 7.5 mg every Mon, Wed and 5 mg all other days. Recheck INR in 10 days.   Patient Instructions  INR below goal. Decrease weekly dose to 7.5 mg every Mon, Wed and 5 mg all other days. Recheck INR in 10 days.  Patient advised to contact clinic or seek medical attention if signs/symptoms of bleeding or thromboembolism occur.  Patient verbalized understanding by repeating back information and was advised to contact me if further medication-related questions arise.   Follow-up Return in about 9 days (around 12/13/2020).  Blenda Bridegroom  Leafy Kindle, PharmD  15 minutes spent face-to-face with the patient during the encounter. 50% of time spent on education, including signs/sx bleeding and clotting, as well as food and drug interactions with warfarin. 50% of time was spent on fingerprick POC INR sample collection,processing, results determination, and documentation

## 2020-12-13 ENCOUNTER — Ambulatory Visit: Payer: Medicare Other | Admitting: Pharmacist

## 2020-12-13 ENCOUNTER — Other Ambulatory Visit: Payer: Self-pay

## 2020-12-13 DIAGNOSIS — Z7901 Long term (current) use of anticoagulants: Secondary | ICD-10-CM

## 2020-12-13 DIAGNOSIS — Z952 Presence of prosthetic heart valve: Secondary | ICD-10-CM

## 2020-12-13 DIAGNOSIS — Z5181 Encounter for therapeutic drug level monitoring: Secondary | ICD-10-CM

## 2020-12-13 LAB — POCT INR: INR: 2.4 (ref 2.0–3.0)

## 2020-12-13 NOTE — Progress Notes (Signed)
Anticoagulation Management Kimberly Hobbs is a 60 y.o. female who reports to the clinic for monitoring of warfarin treatment.    Indication: atrial fibrillation and Hx of Mechanical mitral and aortic valve replacement  ; CHA2DS2 Vasc Score 4 (Female, HTN, DM, Vascular disease hx), HAS-BLED 1 (ASA, prior bleed hx (hematuria, hematochezia) Duration: indefinite Supervising physician: [facility] Visit History:  Patient does not report signs/symptoms of bleeding or thromboembolism   Other recent changes: No changes in diet, medications, lifestyle.   Pt maintaining steady Vit-K intake of spinach salad 3-4/week.  Stable on amiodarone 200 mg daily. Pt reports that she got labs completed through PCP's office. Called to request labs from PCP office. Labs scanned in.   Recently faxed enrollment to [facility] for home INR monitoring. Called to follow up, but [facility] states they havent been processed yet. Refaxed the enrollment fax to the direct fax information provided.  Anticoagulation Episode Summary     Current INR goal:  2.5-3.5  TTR:  58.4 % (2.7 y)  Next INR check:  01/03/2021  INR from last check:  2.4 (12/13/2020)  Weekly max warfarin dose:    Target end date:  Indefinite  INR check location:    Preferred lab:    Send INR reminders to:     Indications   H/O mitral valve replacement with mechanical valve [Z95.2] Monitoring for long-term anticoagulant use [Z51.81 Z79.01]        Comments:           Allergies  Allergen Reactions   Iodinated Diagnostic Agents Other (See Comments)    Patient is unsure of reaction type   Penicillins Other (See Comments)    Immune to drug , does not work per patient  Did it involve swelling of the face/tongue/throat, SOB, or low BP? No Did it involve sudden or severe rash/hives, skin peeling, or any reaction on the inside of your mouth or nose? No Did you need to seek medical attention at a hospital or doctor's office?  No When did it last happen? NOT A TRUE ALLERGY   If all above answers are "NO", may proceed with cephalosporin use.     Current Outpatient Medications:    acetaminophen (TYLENOL) 500 MG tablet, Take 500 mg by mouth every 6 (six) hours as needed (for pain.)., Disp: , Rfl:    albuterol (VENTOLIN HFA) 108 (90 Base) MCG/ACT inhaler, Inhale 2 puffs into the lungs every 6 (six) hours as needed for wheezing or shortness of breath. , Disp: , Rfl:    amiodarone (PACERONE) 200 MG tablet, TAKE 1 TABLET(200 MG) BY MOUTH DAILY, Disp: 60 tablet, Rfl: 1   aspirin EC 81 MG tablet, Take 81 mg by mouth daily. , Disp: , Rfl:    citalopram (CELEXA) 40 MG tablet, Take 20 mg by mouth daily., Disp: , Rfl:    clonazePAM (KLONOPIN) 0.5 MG tablet, Take 0.5 mg by mouth at bedtime. , Disp: , Rfl:    diltiazem (CARDIZEM CD) 180 MG 24 hr capsule, TAKE ONE CAPSULE BY MOUTH DAILY, Disp: 90 capsule, Rfl: 2   furosemide (LASIX) 20 MG tablet, TAKE 1 TABLET BY MOUTH DAILY, Disp: 90 tablet, Rfl: 2   isosorbide mononitrate (IMDUR) 60 MG 24 hr tablet, Take 1 tablet (60 mg total) by mouth daily., Disp: 90 tablet, Rfl: 3   Lancets (ONETOUCH DELICA PLUS [id number]) MISC, TAKE AS DIRECTED ONCE DAILY., Disp: , Rfl:    levalbuterol (XOPENEX HFA) 45 MCG/ACT inhaler, Inhale 1-2 puffs  into the lungs every 6 (six) hours as needed for shortness of breath., Disp: 1 Inhaler, Rfl: 12   losartan (COZAAR) 50 MG tablet, TAKE 1 TABLET BY MOUTH DAILY, Disp: 30 tablet, Rfl: 6   metFORMIN (GLUCOPHAGE) 500 MG tablet, Take 500 mg by mouth 2 (two) times daily., Disp: , Rfl:    metoprolol tartrate (LOPRESSOR) 100 MG tablet, TAKE 1 TABLET(100 MG) BY MOUTH TWICE DAILY, Disp: 180 tablet, Rfl: 3   nitroGLYCERIN (NITROSTAT) 0.4 MG SL tablet, Place 1 tablet (0.4 mg total) under the tongue every 5 (five) minutes x 3 doses as needed for chest pain., Disp: 25 tablet, Rfl: 3   ONETOUCH VERIO test strip, AS DIRECTED ONCE A DAY, Disp: , Rfl:    pantoprazole (PROTONIX)  40 MG tablet, TAKE 1 TABLET(40 MG) BY MOUTH DAILY, Disp: 90 tablet, Rfl: 1   rosuvastatin (CRESTOR) 40 MG tablet, Take 1 tablet (40 mg total) by mouth daily., Disp: 90 tablet, Rfl: 1   spironolactone (ALDACTONE) 50 MG tablet, TAKE 1 TABLET(50 MG) BY MOUTH DAILY, Disp: 30 tablet, Rfl: 6   STIOLTO RESPIMAT 2.5-2.5 MCG/ACT AERS, INHALE 2 PUFFS INTO THE LUNGS DAILY, Disp: 4 g, Rfl: 5   warfarin (COUMADIN) 5 MG tablet, Take 1 tablet (5 mg total) by mouth daily. Refer to most recent anticoagulation note for most accurate information. (Patient taking differently: Take 5 mg by mouth daily. or as directed by coumadin clinic  Currently on: 5 mg every Wed; 7.5 mg all other days), Disp: 90 tablet, Rfl: 2   warfarin (COUMADIN) 7.5 MG tablet, TAKE 1 TABLET BY MOUTH EVERY EVENING (Patient taking differently: 5 mg every Wed; 7.5 mg all other days or as directed by coumadin clinic), Disp: 90 tablet, Rfl: 2 Past Medical History:  Diagnosis Date   COVID-19    Hyperlipidemia    Hypertension    Rheumatic heart disease    MS/ AS    ASSESSMENT  Recent Results: The most recent result is correlated with 40 mg per week:  Lab Results  Component Value Date   INR 2.4 12/13/2020   INR 2.3 12/04/2020   INR 2.2 11/29/2020    Anticoagulation Dosing: Description   INR below goal. Continue current weekly dose of 7.5 mg every Mon, Wed and 5 mg all other days. Recheck INR in 3 weeks.      INR today: Subtherapeutic. Recent labile INR readings. Improved slightly since recent weekly dose increase. Denies any unusual bleeding or bruising symptoms. Denies any other relevant change sin diet, medications, or lifestyle. Amiodarone dose stable at 200 mg. Recent weekly dose increase and continue close monitoring.    PLAN Weekly dose was unchanged by 0 % to 40 mg/week. Continue current weekly dose of 7.5 mg every Mon, Wed and 5 mg all other days. Recheck INR in 3 weeks.   Patient Instructions  INR below goal. Continue current  weekly dose of 7.5 mg every Mon, Wed and 5 mg all other days. Recheck INR in 3 weeks.  Patient advised to contact clinic or seek medical attention if signs/symptoms of bleeding or thromboembolism occur.  Patient verbalized understanding by repeating back information and was advised to contact me if further medication-related questions arise.   Follow-up Return in about 3 weeks (around 01/03/2021).  [name], PharmD  15 minutes spent face-to-face with the patient during the encounter. 50% of time spent on education, including signs/sx bleeding and clotting, as well as food and drug interactions with warfarin. 50% of time  was spent on fingerprick POC INR sample collection,processing, results determination, and documentation

## 2020-12-13 NOTE — Patient Instructions (Signed)
INR below goal. Continue current weekly dose of 7.5 mg every Mon, Wed and 5 mg all other days. Recheck INR in 3 weeks.

## 2021-01-04 NOTE — Progress Notes (Deleted)
Subjective:   Kimberly Hobbs, female    DOB: 1961-02-08, 60 y.o.   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  Patient is doing well. She has had only occasional palpitations. She has not had any sustained tachycardia episodes. Chest pressure is more on laying down, not necessarily related to exertion.   Reviewed recent lab results with the patient, details below.   Current Outpatient Medications on File Prior to Visit  Medication Sig Dispense Refill   acetaminophen (TYLENOL) 500 MG tablet Take 500 mg by mouth every 6 (six) hours as needed (for pain.).     albuterol (VENTOLIN HFA) 108 (90 Base) MCG/ACT inhaler Inhale 2 puffs into the lungs every 6 (six) hours as needed for wheezing or shortness of breath.      amiodarone (PACERONE) 200 MG tablet TAKE 1 TABLET(200 MG) BY MOUTH DAILY 60 tablet 1   aspirin EC 81 MG tablet Take 81 mg by mouth daily.      citalopram (CELEXA) 40 MG tablet Take 20 mg by mouth daily.     clonazePAM (KLONOPIN) 0.5 MG tablet Take 0.5 mg by mouth at bedtime.      diltiazem (CARDIZEM CD) 180 MG 24 hr capsule TAKE ONE CAPSULE BY MOUTH DAILY 90 capsule 2   furosemide (LASIX) 20 MG tablet TAKE 1 TABLET BY MOUTH DAILY 90 tablet 2   isosorbide mononitrate (IMDUR) 60 MG 24 hr tablet Take 1 tablet (60 mg total) by mouth daily. 90 tablet 3   Lancets (ONETOUCH DELICA PLUS [id number]) MISC TAKE AS DIRECTED ONCE DAILY.     levalbuterol (XOPENEX HFA) 45 MCG/ACT inhaler Inhale 1-2 puffs into the lungs every 6 (six) hours as needed for shortness of breath. 1 Inhaler 12   losartan (COZAAR) 50 MG tablet TAKE 1 TABLET BY MOUTH DAILY 30 tablet 6   metFORMIN (GLUCOPHAGE) 500 MG tablet Take 500 mg by mouth 2 (two) times  daily.     metoprolol tartrate (LOPRESSOR) 100 MG tablet TAKE 1 TABLET(100 MG) BY MOUTH TWICE DAILY 180 tablet 3   nitroGLYCERIN (NITROSTAT) 0.4 MG SL tablet Place 1 tablet (0.4 mg total) under the tongue every 5 (five) minutes x 3 doses as needed for chest pain. 25 tablet 3   ONETOUCH VERIO test strip AS DIRECTED ONCE A DAY     pantoprazole (PROTONIX) 40 MG tablet TAKE 1 TABLET(40 MG) BY MOUTH DAILY 90 tablet 1   rosuvastatin (CRESTOR) 40 MG tablet Take 1 tablet (40 mg total) by mouth daily. 90 tablet 1   spironolactone (ALDACTONE) 50 MG tablet TAKE 1 TABLET(50 MG) BY MOUTH DAILY 30 tablet 6   STIOLTO RESPIMAT 2.5-2.5 MCG/ACT AERS INHALE 2 PUFFS INTO THE LUNGS DAILY 4 g 5   warfarin (COUMADIN) 5 MG tablet Take 1 tablet (5 mg total) by mouth daily. Refer to most recent anticoagulation note for most accurate information. (Patient taking differently: Take 5 mg by mouth daily. or as directed by coumadin clinic  Currently on: 5 mg every Wed; 7.5 mg all other days) 90 tablet 2   warfarin (COUMADIN) 7.5 MG tablet TAKE 1 TABLET BY MOUTH EVERY EVENING (Patient taking differently: 5 mg every Wed; 7.5 mg all other days or as directed by coumadin clinic) 90 tablet 2  No current facility-administered medications on file prior to visit.    Cardiovascular studies:  EKG 07/05/2020: Probable sinus bradycardia 49 bpm Left anterior fascicular block Possible old anteroseptal infarct Baseline wander  EKG 05/22/2020: Atrial fibrillation 126 bpm  Left anterior fascicular block  Old anteroseptal infarct Poor R wave progression   EKG 12/06/2019: Atrial fibrillation 11 bpm  Possible old anteroseptal infarct Nonspecific ST-T changes  Coronary and bypass graft angiography 07/21/2018 and 08/18/2018: LM: Normal LAD: 100% mid occlusion. Mild distal disease LIMA-LAD: Patent LCx: Normal RCA: Prox 60% stenosis, mid 30-40% stenoses. TIMI III flow in prox-mid RCA. 100% distal occlusion SVG-RCA: Patent. Ostial  40%. Distal RCA moderate diffuse disease.    Guide catheter angiography provided superior images. There is TIMI III flow in SVG which fills RCA all the way to the ostium. Even if she were to have FFR positive lesion in the graft, I do not think the benefits of a stent would outweigh the risk of losing the graft and the entire RCA territory circulation in a borderline lesion. Also, the prox RCA lesion is well bypassed by the SVG graft. Thus, I decided not to perform FFR/PCI to either of these lesions. Continue medical management.   [place] [place] 07/21/2018: #1: RA: 18 mmHg RV 90/12 mmHg PA: 94/40 mmHg. Mean PA 63 mmHg. PW 33 mmHg  Lasix 80 mg  #2: RA 19 mmHg RV 60/0 mmHg PA: 56/16 mmHg. Mean PA 36 mmHg PW: 25 mmHg  CO: 5.4 L/min. CI 2.6 L/min/m2 PVR 2 WU  Impression: Elevated filling pressures Moderate WHO Grp II pulmonary hypertension Improvement in filling pressures with diuresis.   Echocardiogram 02/26/2018: Left ventricle cavity is normal in size. Mild concentric hypertrophy of the left ventricle. Abnormal septal wall motion due to post-operative valve. Diastolic function could not be assessed due to post op valve and CABG status.  Calculated EF 63%. Left atrial cavity is moderate to severely dilated measures 4.5 cm in long axis. Right atrial cavity is mildly dilated. Mechanical aortic valve with trace regurgitation. Mild aortic valve leaflet calcification. Mildly restricted aortic valve leaflets. Mild to moderate aortic valve stenosis. Aortic valve peak pressure gradient of  43 and mean gradient of 21 mmHg, calculated aortic valve area    0.88 cm. Mechanical mitral valve with trace regurgitation. Moderately restricted mitral valve leaflets. Mild mitral valve stenosis. Mitral valve peak pressure gradient of  26  and mean gradient of  6.2  mmHg, calculated mitral valve area 1.9   cm. Mild to moderate tricuspid regurgitation. Mild pulmonary hypertension. PA systolic pressure estimated at 39  mm Hg. Compared to the study done on 08/06/2017, no significant change.  TEE 07/21/2018:  1. The left ventricle has normal systolic function, with an ejection fraction of 55-60%. There is abnormal septal motion consistent with post-operative status.  2. The right ventricle has normal systolc function.  3. A 25 mm Regent mechanical valve is present in the mitral position. Procedure Date: 2018 Echo findings are consistent with is functioning normally the mitral prosthesis. Normal mitral valve prosthesis. Mean PG 6 mmHg, MVA 1.7 cm2. No thrombus seen.  (Patient known to have subtherapeutic INR).  4. A 41m [employer] mechanical prosthesis valve is present in the aortic position. Procedure Date: 2018 Normal aortic valve prosthesis. Echo findings shows no evidence of Accelration time of 88 msec suggests normal functioning valve. Mean PG 25 mmHg  likely due to small annular diameter. No prosthetic valve stenosis noted. of the aortic prosthesis. No thrombus seen. (Patient known  to have subtherapeutic INR).  Recent labs: April-May 2022: Glucose 129, BUN/Cr 25/1.09. eGFR 59. Na/K 136/4.7. Rest of the CMP normal H/H 11.9/36.8. MCV 92. Platelets 199 TSH 2.8 normal  02/16/2018: BNP 251 elevated  Labs 07/02/2017: A1c 5.7%, HB 12.9/HCT 38.6, platelets 220, normal indicis.  Serum glucose 90 mg, BUN 23, creatinine 0.75, CMP normal.  Potassium 4.6.  TSH normal.  Review of Systems  Cardiovascular:  Positive for dyspnea on exertion and palpitations. Negative for chest pain, leg swelling and syncope.  Musculoskeletal:  Positive for joint pain.       Right forearm/elbow pain        There were no vitals filed for this visit.   Objective:     Physical Exam Vitals and nursing note reviewed.  Constitutional:      General: She is not in acute distress. Neck:     Vascular: No JVD.  Cardiovascular:     Rate and Rhythm: Tachycardia present. Rhythm irregular.     Heart sounds: Normal heart sounds. No  murmur heard.    Comments: Metallic S1, S2 Pulmonary:     Effort: Pulmonary effort is normal.     Breath sounds: Normal breath sounds. No wheezing or rales.         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  *** Paroxysmal Afib: Currently in sinus rhythm Continue amiodarone 200 mg daily. High CHA2DS2VAsc score. Continue warfarin, as below.   *** Goal: 2.5-3.5 Indication: PAF, MVR, AVR Today's INR: 2.7 Current dose: 5 mg Mon, Wed, 7.5 mg all other days New dose: 5 mg wed, 7.5 mg all other days . (Change made owing to pt's planned increased Vit K intake) Next INR check: 2 week  *** CAD of native and bypass grafts without angina,  Patent grafts with non-critical disease.(07/2018) Continue medical management with Aspirin, Imdur 60 mg daiy, losartan 50 mg daily, metoprolol tartarate 100 mg bid, crestor 40 mg daily.  *** S/p MVR, AVR: Continue warfarin  *** Hypertension: Controlled.   F/u in 6 months  Zakyia Gagan Esther Hardy, MD [facility] Pager: [phone] Office: [phone] If no answer Cell [phone]

## 2021-01-05 ENCOUNTER — Ambulatory Visit: Payer: 59 | Admitting: Cardiology

## 2021-01-08 ENCOUNTER — Ambulatory Visit: Payer: Self-pay | Admitting: Pharmacist

## 2021-01-08 DIAGNOSIS — Z952 Presence of prosthetic heart valve: Secondary | ICD-10-CM

## 2021-01-08 DIAGNOSIS — Z5181 Encounter for therapeutic drug level monitoring: Secondary | ICD-10-CM

## 2021-01-08 LAB — POCT INR: INR: 2.2 (ref 2.0–3.0)

## 2021-01-08 NOTE — Progress Notes (Signed)
Anticoagulation Management Kimberly Hobbs is a 60 y.o. female who reports to the clinic for monitoring of warfarin treatment.    Indication: atrial fibrillation and Hx of Mechanical mitral and aortic valve replacement  ; CHA2DS2 Vasc Score 4 (Female, HTN, DM, Vascular disease hx), HAS-BLED 1 (ASA, prior bleed hx (hematuria, hematochezia) Duration: indefinite Supervising physician: [facility] Visit History:  Patient does not report signs/symptoms of bleeding or thromboembolism   Other recent changes: No changes in diet, medications, lifestyle.   Pt maintaining steady Vit-K intake of spinach salad 3-4/week.  Stable on amiodarone 200 mg daily.   Pt enrolled in home INR monitoring. INR on 12/29/20 noted to be 2.5. Pt was recommended to continue home dose of 7.5 mg Mon, Wed and 5 mg all other days.   Anticoagulation Episode Summary     Current INR goal:  2.5-3.5  TTR:  56.9 % (2.8 y)  Next INR check:  01/15/2021  INR from last check:  2.2 (01/08/2021)  Weekly max warfarin dose:    Target end date:  Indefinite  INR check location:    Preferred lab:    Send INR reminders to:     Indications   H/O mitral valve replacement with mechanical valve [Z95.2] Monitoring for long-term anticoagulant use [Z51.81 Z79.01]        Comments:           Allergies  Allergen Reactions   Iodinated Diagnostic Agents Other (See Comments)    Patient is unsure of reaction type   Penicillins Other (See Comments)    Immune to drug , does not work per patient  Did it involve swelling of the face/tongue/throat, SOB, or low BP? No Did it involve sudden or severe rash/hives, skin peeling, or any reaction on the inside of your mouth or nose? No Did you need to seek medical attention at a hospital or doctor's office? No When did it last happen? NOT A TRUE ALLERGY   If all above answers are "NO", may proceed with cephalosporin use.     Current Outpatient Medications:     acetaminophen (TYLENOL) 500 MG tablet, Take 500 mg by mouth every 6 (six) hours as needed (for pain.)., Disp: , Rfl:    albuterol (VENTOLIN HFA) 108 (90 Base) MCG/ACT inhaler, Inhale 2 puffs into the lungs every 6 (six) hours as needed for wheezing or shortness of breath. , Disp: , Rfl:    amiodarone (PACERONE) 200 MG tablet, TAKE 1 TABLET(200 MG) BY MOUTH DAILY, Disp: 60 tablet, Rfl: 1   aspirin EC 81 MG tablet, Take 81 mg by mouth daily. , Disp: , Rfl:    citalopram (CELEXA) 40 MG tablet, Take 20 mg by mouth daily., Disp: , Rfl:    clonazePAM (KLONOPIN) 0.5 MG tablet, Take 0.5 mg by mouth at bedtime. , Disp: , Rfl:    diltiazem (CARDIZEM CD) 180 MG 24 hr capsule, TAKE ONE CAPSULE BY MOUTH DAILY, Disp: 90 capsule, Rfl: 2   furosemide (LASIX) 20 MG tablet, TAKE 1 TABLET BY MOUTH DAILY, Disp: 90 tablet, Rfl: 2   isosorbide mononitrate (IMDUR) 60 MG 24 hr tablet, Take 1 tablet (60 mg total) by mouth daily., Disp: 90 tablet, Rfl: 3   Lancets (ONETOUCH DELICA PLUS [id number]) MISC, TAKE AS DIRECTED ONCE DAILY., Disp: , Rfl:    levalbuterol (XOPENEX HFA) 45 MCG/ACT inhaler, Inhale 1-2 puffs into the lungs every 6 (six) hours as needed for shortness of breath., Disp: 1 Inhaler, Rfl: 12  losartan (COZAAR) 50 MG tablet, TAKE 1 TABLET BY MOUTH DAILY, Disp: 30 tablet, Rfl: 6   metFORMIN (GLUCOPHAGE) 500 MG tablet, Take 500 mg by mouth 2 (two) times daily., Disp: , Rfl:    metoprolol tartrate (LOPRESSOR) 100 MG tablet, TAKE 1 TABLET(100 MG) BY MOUTH TWICE DAILY, Disp: 180 tablet, Rfl: 3   nitroGLYCERIN (NITROSTAT) 0.4 MG SL tablet, Place 1 tablet (0.4 mg total) under the tongue every 5 (five) minutes x 3 doses as needed for chest pain., Disp: 25 tablet, Rfl: 3   ONETOUCH VERIO test strip, AS DIRECTED ONCE A DAY, Disp: , Rfl:    pantoprazole (PROTONIX) 40 MG tablet, TAKE 1 TABLET(40 MG) BY MOUTH DAILY, Disp: 90 tablet, Rfl: 1   rosuvastatin (CRESTOR) 40 MG tablet, Take 1 tablet (40 mg total) by mouth daily.,  Disp: 90 tablet, Rfl: 1   spironolactone (ALDACTONE) 50 MG tablet, TAKE 1 TABLET(50 MG) BY MOUTH DAILY, Disp: 30 tablet, Rfl: 6   STIOLTO RESPIMAT 2.5-2.5 MCG/ACT AERS, INHALE 2 PUFFS INTO THE LUNGS DAILY, Disp: 4 g, Rfl: 5   warfarin (COUMADIN) 5 MG tablet, Take 1 tablet (5 mg total) by mouth daily. Refer to most recent anticoagulation note for most accurate information. (Patient taking differently: Take 5 mg by mouth daily. or as directed by coumadin clinic  Currently on: 5 mg every Wed; 7.5 mg all other days), Disp: 90 tablet, Rfl: 2   warfarin (COUMADIN) 7.5 MG tablet, TAKE 1 TABLET BY MOUTH EVERY EVENING (Patient taking differently: 5 mg every Wed; 7.5 mg all other days or as directed by coumadin clinic), Disp: 90 tablet, Rfl: 2 Past Medical History:  Diagnosis Date   COVID-19    Hyperlipidemia    Hypertension    Rheumatic heart disease    MS/ AS    ASSESSMENT  Recent Results: The most recent result is correlated with 40 mg per week:  Lab Results  Component Value Date   INR 2.2 01/08/2021   INR 2.4 12/13/2020   INR 2.3 12/04/2020    Anticoagulation Dosing: Description   INR below goal. Increase weekly dose to 7.5 mg every Mon, Wed, Fri and 5 mg all other days. Recheck INR in 1 weeks.      INR today: Subtherapeutic. INR continues to remain below goal on current weekly dose of 40 mg/week. Denies any unusual bleeding or bruising symptoms. Denies any other relevant changes in diet, medications, or lifestyle. Amiodarone dose stable at 200 mg. In setting of unexplained subtherapeutic INR readings, will increase weekly dose slightly and have close monitoring and follow up.   PLAN Weekly dose was increased by 6.2 % to 42.5 mg/week. Increase weekly dose to 7.5 mg every Mon, Wed, Fri and 5 mg all other days. Recheck INR in 1 weeks.   Patient Instructions  INR below goal. Increase weekly dose to 7.5 mg every Mon, Wed, Fri and 5 mg all other days. Recheck INR in 1 weeks.  Patient  advised to contact clinic or seek medical attention if signs/symptoms of bleeding or thromboembolism occur.  Patient verbalized understanding by repeating back information and was advised to contact me if further medication-related questions arise.   Follow-up Return in about 1 week (around 01/15/2021).  [name], PharmD  15 minutes spent face-to-face with the patient during the encounter. 50% of time spent on education, including signs/sx bleeding and clotting, as well as food and drug interactions with warfarin. 50% of time was spent on fingerprick POC INR sample collection,processing,  results determination, and documentation

## 2021-01-08 NOTE — Telephone Encounter (Signed)
From pt

## 2021-01-08 NOTE — Patient Instructions (Signed)
INR below goal. Increase weekly dose to 7.5 mg every Mon, Wed, Fri and 5 mg all other days. Recheck INR in 1 weeks.

## 2021-01-15 LAB — POCT INR: INR: 2.2 (ref 2.0–3.0)

## 2021-01-16 ENCOUNTER — Ambulatory Visit: Payer: Self-pay | Admitting: Pharmacist

## 2021-01-16 DIAGNOSIS — Z5181 Encounter for therapeutic drug level monitoring: Secondary | ICD-10-CM

## 2021-01-16 DIAGNOSIS — Z952 Presence of prosthetic heart valve: Secondary | ICD-10-CM

## 2021-01-16 NOTE — Progress Notes (Signed)
Anticoagulation Management Kimberly Hobbs is a 60 y.o. female who reports to the clinic for monitoring of warfarin treatment.    Indication: atrial fibrillation and Hx of Mechanical mitral and aortic valve replacement  ; CHA2DS2 Vasc Score 4 (Female, HTN, DM, Vascular disease hx), HAS-BLED 1 (ASA, prior bleed hx (hematuria, hematochezia) Duration: indefinite Supervising physician: [facility] Visit History:  Patient does not report signs/symptoms of bleeding or thromboembolism   Other recent changes: No changes in diet, medications, lifestyle.   Pt maintaining steady Vit-K intake of spinach salad 3-4/week.  Stable on amiodarone 200 mg daily.   Pt reports that she had broccoli casserole, extra salads, spinach last week for thanksgiving. Pt planning on returning back to normal routine moving forward.   Anticoagulation Episode Summary     Current INR goal:  2.5-3.5  TTR:  56.5 % (2.8 y)  Next INR check:  01/22/2021  INR from last check:  2.2 (01/15/2021)  Weekly max warfarin dose:    Target end date:  Indefinite  INR check location:    Preferred lab:    Send INR reminders to:     Indications   H/O mitral valve replacement with mechanical valve [Z95.2] Monitoring for long-term anticoagulant use [Z51.81 Z79.01]        Comments:           Allergies  Allergen Reactions   Iodinated Diagnostic Agents Other (See Comments)    Patient is unsure of reaction type   Penicillins Other (See Comments)    Immune to drug , does not work per patient  Did it involve swelling of the face/tongue/throat, SOB, or low BP? No Did it involve sudden or severe rash/hives, skin peeling, or any reaction on the inside of your mouth or nose? No Did you need to seek medical attention at a hospital or doctor's office? No When did it last happen? NOT A TRUE ALLERGY   If all above answers are "NO", may proceed with cephalosporin use.     Current Outpatient Medications:     acetaminophen (TYLENOL) 500 MG tablet, Take 500 mg by mouth every 6 (six) hours as needed (for pain.)., Disp: , Rfl:    albuterol (VENTOLIN HFA) 108 (90 Base) MCG/ACT inhaler, Inhale 2 puffs into the lungs every 6 (six) hours as needed for wheezing or shortness of breath. , Disp: , Rfl:    amiodarone (PACERONE) 200 MG tablet, TAKE 1 TABLET(200 MG) BY MOUTH DAILY, Disp: 60 tablet, Rfl: 1   aspirin EC 81 MG tablet, Take 81 mg by mouth daily. , Disp: , Rfl:    citalopram (CELEXA) 40 MG tablet, Take 20 mg by mouth daily., Disp: , Rfl:    clonazePAM (KLONOPIN) 0.5 MG tablet, Take 0.5 mg by mouth at bedtime. , Disp: , Rfl:    diltiazem (CARDIZEM CD) 180 MG 24 hr capsule, TAKE ONE CAPSULE BY MOUTH DAILY, Disp: 90 capsule, Rfl: 2   furosemide (LASIX) 20 MG tablet, TAKE 1 TABLET BY MOUTH DAILY, Disp: 90 tablet, Rfl: 2   isosorbide mononitrate (IMDUR) 60 MG 24 hr tablet, Take 1 tablet (60 mg total) by mouth daily., Disp: 90 tablet, Rfl: 3   Lancets (ONETOUCH DELICA PLUS [id number]) MISC, TAKE AS DIRECTED ONCE DAILY., Disp: , Rfl:    levalbuterol (XOPENEX HFA) 45 MCG/ACT inhaler, Inhale 1-2 puffs into the lungs every 6 (six) hours as needed for shortness of breath., Disp: 1 Inhaler, Rfl: 12   losartan (COZAAR) 50 MG tablet, TAKE  1 TABLET BY MOUTH DAILY, Disp: 30 tablet, Rfl: 6   metFORMIN (GLUCOPHAGE) 500 MG tablet, Take 500 mg by mouth 2 (two) times daily., Disp: , Rfl:    metoprolol tartrate (LOPRESSOR) 100 MG tablet, TAKE 1 TABLET(100 MG) BY MOUTH TWICE DAILY, Disp: 180 tablet, Rfl: 3   nitroGLYCERIN (NITROSTAT) 0.4 MG SL tablet, Place 1 tablet (0.4 mg total) under the tongue every 5 (five) minutes x 3 doses as needed for chest pain., Disp: 25 tablet, Rfl: 3   ONETOUCH VERIO test strip, AS DIRECTED ONCE A DAY, Disp: , Rfl:    pantoprazole (PROTONIX) 40 MG tablet, TAKE 1 TABLET(40 MG) BY MOUTH DAILY, Disp: 90 tablet, Rfl: 1   rosuvastatin (CRESTOR) 40 MG tablet, Take 1 tablet (40 mg total) by mouth  daily., Disp: 90 tablet, Rfl: 1   spironolactone (ALDACTONE) 50 MG tablet, TAKE 1 TABLET(50 MG) BY MOUTH DAILY, Disp: 30 tablet, Rfl: 6   STIOLTO RESPIMAT 2.5-2.5 MCG/ACT AERS, INHALE 2 PUFFS INTO THE LUNGS DAILY, Disp: 4 g, Rfl: 5   warfarin (COUMADIN) 5 MG tablet, Take 1 tablet (5 mg total) by mouth daily. Refer to most recent anticoagulation note for most accurate information. (Patient taking differently: Take 5 mg by mouth daily. or as directed by coumadin clinic  Currently on: 5 mg every Wed; 7.5 mg all other days), Disp: 90 tablet, Rfl: 2   warfarin (COUMADIN) 7.5 MG tablet, TAKE 1 TABLET BY MOUTH EVERY EVENING (Patient taking differently: 5 mg every Wed; 7.5 mg all other days or as directed by coumadin clinic), Disp: 90 tablet, Rfl: 2 Past Medical History:  Diagnosis Date   COVID-19    Hyperlipidemia    Hypertension    Rheumatic heart disease    MS/ AS    ASSESSMENT  Recent Results: The most recent result is correlated with 42.5 mg per week:  Lab Results  Component Value Date   INR 2.2 01/15/2021   INR 2.2 01/08/2021   INR 2.4 12/13/2020    Anticoagulation Dosing: Description   INR below goal. Take 7.5 mg today and then continue current weekly dose of 7.5 mg every Mon, Wed, Fri and 5 mg all other days. Recheck INR in 1 weeks.      INR today: Subtherapeutic. INR still staying subtherapeutic despite recent weekly dose increase. Pt notes that she had higher than normal Vit K intake. Pt planing on returning back to previously stable Vit K intake. Denies any unusual bleeding or bruising symptoms. Denies any other relevant changes in diet, medications, or lifestyle. Amiodarone dose stable at 200 mg. Recent weekly dose increase, thus will boost today and have close monitoring and follow up. If INR continues to remain elevated at next INR check, will consider increasing weekly dose further at next INR check.  PLAN Weekly dose was unchanged by 0 % to 42.5 mg/week. Take 7.5 mg today  and then continue current weekly dose of 7.5 mg every Mon, Wed, Fri and 5 mg all other days. Recheck INR in 1 week.   Patient Instructions  INR below goal. Take 7.5 mg today and then continue current weekly dose of 7.5 mg every Mon, Wed, Fri and 5 mg all other days. Recheck INR in 1 weeks.  Patient advised to contact clinic or seek medical attention if signs/symptoms of bleeding or thromboembolism occur.  Patient verbalized understanding by repeating back information and was advised to contact me if further medication-related questions arise.   Follow-up Return in about 6 days (around  01/22/2021).  [name], PharmD  15 minutes spent face-to-face with the patient during the encounter. 50% of time spent on education, including signs/sx bleeding and clotting, as well as food and drug interactions with warfarin. 50% of time was spent on fingerprick POC INR sample collection,processing, results determination, and documentation

## 2021-01-16 NOTE — Patient Instructions (Signed)
INR below goal. Take 7.5 mg today and then continue current weekly dose of 7.5 mg every Mon, Wed, Fri and 5 mg all other days. Recheck INR in 1 weeks.

## 2021-01-22 ENCOUNTER — Other Ambulatory Visit: Payer: Self-pay

## 2021-01-22 ENCOUNTER — Encounter: Payer: Self-pay | Admitting: Cardiology

## 2021-01-22 ENCOUNTER — Ambulatory Visit: Payer: Medicare Other | Admitting: Cardiology

## 2021-01-22 VITALS — BP 113/54 | HR 44 | Temp 98.0°F | Resp 16 | Ht 65.0 in | Wt 216.0 lb

## 2021-01-22 DIAGNOSIS — Z952 Presence of prosthetic heart valve: Secondary | ICD-10-CM

## 2021-01-22 DIAGNOSIS — I25708 Atherosclerosis of coronary artery bypass graft(s), unspecified, with other forms of angina pectoris: Secondary | ICD-10-CM

## 2021-01-22 DIAGNOSIS — I480 Paroxysmal atrial fibrillation: Secondary | ICD-10-CM

## 2021-01-22 DIAGNOSIS — I10 Essential (primary) hypertension: Secondary | ICD-10-CM

## 2021-01-22 DIAGNOSIS — Z5181 Encounter for therapeutic drug level monitoring: Secondary | ICD-10-CM

## 2021-01-22 LAB — POCT INR: INR: 2.6 (ref 2.0–3.0)

## 2021-01-22 MED ORDER — METOPROLOL TARTRATE 50 MG PO TABS: 50.0000 mg | ORAL_TABLET | Freq: Two times a day (BID) | ORAL | 3 refills | Status: DC

## 2021-01-22 NOTE — Patient Instructions (Signed)
INR at goal. Increase weekly dose to 5 mg every Tues, Thurs, Sat and 7.5 mg all other days. Recheck INR in 1 week

## 2021-01-22 NOTE — Progress Notes (Signed)
Subjective:   Kimberly Hobbs, female    DOB: 25-Aug-1960, 60 y.o.   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  Patient does not have any cardiac complaints today. She has felt "vertigo" symptoms since Friday. Denies any dyspnea, leg edema.   Current Outpatient Medications on File Prior to Visit  Medication Sig Dispense Refill   acetaminophen (TYLENOL) 500 MG tablet Take 500 mg by mouth every 6 (six) hours as needed (for pain.).     albuterol (VENTOLIN HFA) 108 (90 Base) MCG/ACT inhaler Inhale 2 puffs into the lungs every 6 (six) hours as needed for wheezing or shortness of breath.      amiodarone (PACERONE) 200 MG tablet TAKE 1 TABLET(200 MG) BY MOUTH DAILY 60 tablet 1   aspirin EC 81 MG tablet Take 81 mg by mouth daily.      citalopram (CELEXA) 40 MG tablet Take 20 mg by mouth daily.     clonazePAM (KLONOPIN) 0.5 MG tablet Take 0.5 mg by mouth at bedtime.      diltiazem (CARDIZEM CD) 180 MG 24 hr capsule TAKE ONE CAPSULE BY MOUTH DAILY 90 capsule 2   furosemide (LASIX) 20 MG tablet TAKE 1 TABLET BY MOUTH DAILY 90 tablet 2   isosorbide mononitrate (IMDUR) 60 MG 24 hr tablet Take 1 tablet (60 mg total) by mouth daily. 90 tablet 3   Lancets (ONETOUCH DELICA PLUS [id number]) MISC TAKE AS DIRECTED ONCE DAILY.     levalbuterol (XOPENEX HFA) 45 MCG/ACT inhaler Inhale 1-2 puffs into the lungs every 6 (six) hours as needed for shortness of breath. 1 Inhaler 12   losartan (COZAAR) 50 MG tablet TAKE 1 TABLET BY MOUTH DAILY 30 tablet 6   metFORMIN (GLUCOPHAGE) 500 MG tablet Take 500 mg by mouth 2 (two) times daily.     metoprolol tartrate (LOPRESSOR) 100 MG tablet TAKE 1 TABLET(100 MG) BY MOUTH TWICE DAILY 180 tablet 3    nitroGLYCERIN (NITROSTAT) 0.4 MG SL tablet Place 1 tablet (0.4 mg total) under the tongue every 5 (five) minutes x 3 doses as needed for chest pain. 25 tablet 3   ONETOUCH VERIO test strip AS DIRECTED ONCE A DAY     pantoprazole (PROTONIX) 40 MG tablet TAKE 1 TABLET(40 MG) BY MOUTH DAILY 90 tablet 1   rosuvastatin (CRESTOR) 40 MG tablet Take 1 tablet (40 mg total) by mouth daily. 90 tablet 1   spironolactone (ALDACTONE) 50 MG tablet TAKE 1 TABLET(50 MG) BY MOUTH DAILY 30 tablet 6   STIOLTO RESPIMAT 2.5-2.5 MCG/ACT AERS INHALE 2 PUFFS INTO THE LUNGS DAILY 4 g 5   warfarin (COUMADIN) 5 MG tablet Take 1 tablet (5 mg total) by mouth daily. Refer to most recent anticoagulation note for most accurate information. (Patient taking differently: Take 5 mg by mouth daily. or as directed by coumadin clinic  Currently on: 5 mg every Wed; 7.5 mg all other days) 90 tablet 2   warfarin (COUMADIN) 7.5 MG tablet TAKE 1 TABLET BY MOUTH EVERY EVENING (Patient taking differently: 5 mg every Wed; 7.5 mg all other days or as directed by coumadin clinic) 90 tablet 2   No current facility-administered medications on file prior to visit.    Cardiovascular studies:  EKG 01/22/2021: Probable sinus bradycardia 46  bpm LAFB  Coronary and bypass graft angiography 07/21/2018 and 08/18/2018: LM: Normal LAD: 100% mid occlusion. Mild distal disease LIMA-LAD: Patent LCx: Normal RCA: Prox 60% stenosis, mid 30-40% stenoses. TIMI III flow in prox-mid RCA. 100% distal occlusion SVG-RCA: Patent. Ostial 40%. Distal RCA moderate diffuse disease.    Guide catheter angiography provided superior images. There is TIMI III flow in SVG which fills RCA all the way to the ostium. Even if she were to have FFR positive lesion in the graft, I do not think the benefits of a stent would outweigh the risk of losing the graft and the entire RCA territory circulation in a borderline lesion. Also, the prox RCA lesion is well bypassed by the SVG  graft. Thus, I decided not to perform FFR/PCI to either of these lesions. Continue medical management.   [place] 07/21/2018: #1: RA: 18 mmHg RV 90/12 mmHg PA: 94/40 mmHg. Mean PA 63 mmHg. PW 33 mmHg  Lasix 80 mg  #2: RA 19 mmHg RV 60/0 mmHg PA: 56/16 mmHg. Mean PA 36 mmHg PW: 25 mmHg  CO: 5.4 L/min. CI 2.6 L/min/m2 PVR 2 WU  Impression: Elevated filling pressures Moderate WHO Grp II pulmonary hypertension Improvement in filling pressures with diuresis.   Echocardiogram 02/26/2018: Left ventricle cavity is normal in size. Mild concentric hypertrophy of the left ventricle. Abnormal septal wall motion due to post-operative valve. Diastolic function could not be assessed due to post op valve and CABG status.  Calculated EF 63%. Left atrial cavity is moderate to severely dilated measures 4.5 cm in long axis. Right atrial cavity is mildly dilated. Mechanical aortic valve with trace regurgitation. Mild aortic valve leaflet calcification. Mildly restricted aortic valve leaflets. Mild to moderate aortic valve stenosis. Aortic valve peak pressure gradient of  43 and mean gradient of 21 mmHg, calculated aortic valve area    0.88 cm. Mechanical mitral valve with trace regurgitation. Moderately restricted mitral valve leaflets. Mild mitral valve stenosis. Mitral valve peak pressure gradient of  26  and mean gradient of  6.2  mmHg, calculated mitral valve area 1.9   cm. Mild to moderate tricuspid regurgitation. Mild pulmonary hypertension. PA systolic pressure estimated at 39 mm Hg. Compared to the study done on 08/06/2017, no significant change.  TEE 07/21/2018:  1. The left ventricle has normal systolic function, with an ejection fraction of 55-60%. There is abnormal septal motion consistent with post-operative status.  2. The right ventricle has normal systolc function.  3. A 25 mm Regent mechanical valve is present in the mitral position. Procedure Date: 2018 Echo findings are consistent with is  functioning normally the mitral prosthesis. Normal mitral valve prosthesis. Mean PG 6 mmHg, MVA 1.7 cm2. No thrombus seen.  (Patient known to have subtherapeutic INR).  4. A 3m [employer] mechanical prosthesis valve is present in the aortic position. Procedure Date: 2018 Normal aortic valve prosthesis. Echo findings shows no evidence of Accelration time of 88 msec suggests normal functioning valve. Mean PG 25 mmHg  likely due to small annular diameter. No prosthetic valve stenosis noted. of the aortic prosthesis. No thrombus seen. (Patient known to have subtherapeutic INR).  Recent labs: April-May 2022: Glucose 129, BUN/Cr 25/1.09. eGFR 59. Na/K 136/4.7. Rest of the CMP normal H/H 11.9/36.8. MCV 92. Platelets 199 TSH 2.8 normal  02/16/2018: BNP 251 elevated  Labs 07/02/2017: A1c 5.7%, HB 12.9/HCT 38.6, platelets 220, normal indicis.  Serum glucose 90 mg, BUN 23, creatinine 0.75, CMP normal.  Potassium 4.6.  TSH normal.  Review of Systems  Cardiovascular:  Negative for chest pain, dyspnea on exertion, leg swelling, palpitations and syncope.  Musculoskeletal:  Positive for joint pain.       Right forearm/elbow pain  Neurological:  Positive for dizziness.        Vitals:   01/22/21 0955  BP: (!) 113/54  Pulse: (!) 44  Resp: 16  Temp: 98 F (36.7 C)  SpO2: 97%    Objective:     Physical Exam Vitals and nursing note reviewed.  Constitutional:      General: She is not in acute distress. Neck:     Vascular: No JVD.  Cardiovascular:     Rate and Rhythm: Bradycardia present. Rhythm irregular.     Heart sounds: Normal heart sounds. No murmur heard.    Comments: Metallic S1, S2 Pulmonary:     Effort: Pulmonary effort is normal.     Breath sounds: Normal breath sounds. No wheezing or rales.         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  Paroxysmal Afib: Currently in probably sinus bradycardia. Reduce metoprolol tartrate to 50 mg twice daily.  We will see her back in 2 months to reassess her heart rate.  Continue amiodarone 200 mg daily. High CHA2DS2VAsc score. Continue warfarin, as below.   Goal: 2.5-3.5 Indication: PAF, MVR, AVR Today's INR: 2.6 Current dose: 7.5 mg M, W, F, 5 mg all other days New dose: 5 mg T, Th, Sa, 7.5 mg all other days .  Next INR check: 1 week  CAD of native and bypass grafts without angina,  Patent grafts with non-critical disease.(07/2018) Continue medical management with Aspirin, Imdur 60 mg daiy, losartan 50 mg daily, metoprolol tartarate 100 mg bid, crestor 40 mg daily.  S/p MVR, AVR: Continue warfarin  Hypertension: Controlled.  F/u in 2 months  Shanaiya Bene Esther Hardy, MD [facility] Pager: [phone] Office: [phone] If no answer Cell [phone]

## 2021-01-30 ENCOUNTER — Ambulatory Visit: Payer: Self-pay | Admitting: Pharmacist

## 2021-01-30 DIAGNOSIS — Z952 Presence of prosthetic heart valve: Secondary | ICD-10-CM

## 2021-01-30 DIAGNOSIS — Z7901 Long term (current) use of anticoagulants: Secondary | ICD-10-CM

## 2021-01-30 DIAGNOSIS — Z5181 Encounter for therapeutic drug level monitoring: Secondary | ICD-10-CM

## 2021-01-30 LAB — POCT INR: INR: 3 (ref 2.0–3.0)

## 2021-01-30 NOTE — Patient Instructions (Signed)
INR at goal. Continue current weekly dose of 5 mg every Tues, Thurs, Sat and 7.5 mg all other days. Recheck INR in 1 week

## 2021-01-30 NOTE — Progress Notes (Signed)
Anticoagulation Management Kimberly Hobbs is a 60 y.o. female who reports to the clinic for monitoring of warfarin treatment.    Indication: atrial fibrillation and Hx of Mechanical mitral and aortic valve replacement  ; CHA2DS2 Vasc Score 4 (Female, HTN, DM, Vascular disease hx), HAS-BLED 1 (ASA, prior bleed hx (hematuria, hematochezia) Duration: indefinite Supervising physician: [facility] Visit History:  Patient does not report signs/symptoms of bleeding or thromboembolism   Other recent changes: No changes in diet, medications, lifestyle.   Pt maintaining steady Vit-K intake of spinach salad 3-4/week.  Stable on amiodarone 200 mg daily.   Pt reports that she had broccoli casserole, extra salads, spinach last week for thanksgiving. Pt planning on returning back to normal routine moving forward.   Anticoagulation Episode Summary     Current INR goal:  2.5-3.5  TTR:  56.7 % (2.8 y)  Next INR check:  02/06/2021  INR from last check:  3.0 (01/30/2021)  Weekly max warfarin dose:    Target end date:  Indefinite  INR check location:    Preferred lab:    Send INR reminders to:     Indications   H/O mitral valve replacement with mechanical valve [Z95.2] Monitoring for long-term anticoagulant use [Z51.81 Z79.01]        Comments:           Allergies  Allergen Reactions   Iodinated Diagnostic Agents Other (See Comments)    Patient is unsure of reaction type   Penicillins Other (See Comments)    Immune to drug , does not work per patient  Did it involve swelling of the face/tongue/throat, SOB, or low BP? No Did it involve sudden or severe rash/hives, skin peeling, or any reaction on the inside of your mouth or nose? No Did you need to seek medical attention at a hospital or doctor's office? No When did it last happen? NOT A TRUE ALLERGY   If all above answers are NO, may proceed with cephalosporin use.     Current Outpatient  Medications:    acetaminophen (TYLENOL) 500 MG tablet, Take 500 mg by mouth every 6 (six) hours as needed (for pain.)., Disp: , Rfl:    albuterol (VENTOLIN HFA) 108 (90 Base) MCG/ACT inhaler, Inhale 2 puffs into the lungs every 6 (six) hours as needed for wheezing or shortness of breath. , Disp: , Rfl:    amiodarone (PACERONE) 200 MG tablet, TAKE 1 TABLET(200 MG) BY MOUTH DAILY, Disp: 60 tablet, Rfl: 1   aspirin EC 81 MG tablet, Take 81 mg by mouth daily. , Disp: , Rfl:    citalopram (CELEXA) 40 MG tablet, Take 20 mg by mouth daily., Disp: , Rfl:    clonazePAM (KLONOPIN) 0.5 MG tablet, Take 0.5 mg by mouth at bedtime. , Disp: , Rfl:    diltiazem (CARDIZEM CD) 180 MG 24 hr capsule, TAKE ONE CAPSULE BY MOUTH DAILY, Disp: 90 capsule, Rfl: 2   furosemide (LASIX) 20 MG tablet, TAKE 1 TABLET BY MOUTH DAILY, Disp: 90 tablet, Rfl: 2   isosorbide mononitrate (IMDUR) 60 MG 24 hr tablet, Take 1 tablet (60 mg total) by mouth daily., Disp: 90 tablet, Rfl: 3   Lancets (ONETOUCH DELICA PLUS [id number]) MISC, TAKE AS DIRECTED ONCE DAILY., Disp: , Rfl:    levalbuterol (XOPENEX HFA) 45 MCG/ACT inhaler, Inhale 1-2 puffs into the lungs every 6 (six) hours as needed for shortness of breath., Disp: 1 Inhaler, Rfl: 12   losartan (COZAAR) 50 MG tablet, TAKE  1 TABLET BY MOUTH DAILY, Disp: 30 tablet, Rfl: 6   metFORMIN (GLUCOPHAGE) 500 MG tablet, Take 500 mg by mouth 2 (two) times daily., Disp: , Rfl:    metoprolol tartrate (LOPRESSOR) 50 MG tablet, Take 1 tablet (50 mg total) by mouth 2 (two) times daily., Disp: 60 tablet, Rfl: 3   nitroGLYCERIN (NITROSTAT) 0.4 MG SL tablet, Place 1 tablet (0.4 mg total) under the tongue every 5 (five) minutes x 3 doses as needed for chest pain., Disp: 25 tablet, Rfl: 3   ONETOUCH VERIO test strip, AS DIRECTED ONCE A DAY, Disp: , Rfl:    pantoprazole (PROTONIX) 40 MG tablet, TAKE 1 TABLET(40 MG) BY MOUTH DAILY, Disp: 90 tablet, Rfl: 1   rosuvastatin (CRESTOR) 40 MG tablet, Take 1 tablet (40  mg total) by mouth daily., Disp: 90 tablet, Rfl: 1   spironolactone (ALDACTONE) 50 MG tablet, TAKE 1 TABLET(50 MG) BY MOUTH DAILY, Disp: 30 tablet, Rfl: 6   STIOLTO RESPIMAT 2.5-2.5 MCG/ACT AERS, INHALE 2 PUFFS INTO THE LUNGS DAILY, Disp: 4 g, Rfl: 5   warfarin (COUMADIN) 5 MG tablet, Take 1 tablet (5 mg total) by mouth daily. Refer to most recent anticoagulation note for most accurate information. (Patient taking differently: Take 5 mg by mouth daily. or as directed by coumadin clinic  Currently on: 5 mg every Wed; 7.5 mg all other days), Disp: 90 tablet, Rfl: 2   warfarin (COUMADIN) 7.5 MG tablet, TAKE 1 TABLET BY MOUTH EVERY EVENING (Patient taking differently: 5 mg every Wed; 7.5 mg all other days or as directed by coumadin clinic), Disp: 90 tablet, Rfl: 2 Past Medical History:  Diagnosis Date   COVID-19    Hyperlipidemia    Hypertension    Rheumatic heart disease    MS/ AS    ASSESSMENT  Recent Results: The most recent result is correlated with 45 mg per week:  Lab Results  Component Value Date   INR 3.0 01/30/2021   INR 2.6 01/22/2021   INR 2.2 01/15/2021    Anticoagulation Dosing: Description   INR at goal. Continue current weekly dose of 5 mg every Tues, Thurs, Sat and 7.5 mg all other days. Recheck INR in 1 week     INR today: Therapeutic. INR improved following recent weekly dose increase. Pt notes stable Vit K intake. Denies any unusual bleeding or bruising symptoms. Denies any other relevant changes in diet, medications, or lifestyle. Amiodarone dose stable at 200 mg. Will continue current weekly dose and continue remote monitoring.   PLAN Weekly dose was unchanged by 0 % to 45 mg/week. Continue current weekly dose of 5 mg every Tues, Thurs, Sat and 7.5 mg all other days. Recheck INR in 1 week.   Patient Instructions  INR at goal. Continue current weekly dose of 5 mg every Tues, Thurs, Sat and 7.5 mg all other days. Recheck INR in 1 week Patient advised to contact  clinic or seek medical attention if signs/symptoms of bleeding or thromboembolism occur.  Patient verbalized understanding by repeating back information and was advised to contact me if further medication-related questions arise.   Follow-up Return in about 1 week (around 02/06/2021).  [name], PharmD  15 minutes spent face-to-face with the patient during the encounter. 50% of time spent on education, including signs/sx bleeding and clotting, as well as food and drug interactions with warfarin. 50% of time was spent on fingerprick POC INR sample collection,processing, results determination, and documentation

## 2021-01-31 ENCOUNTER — Other Ambulatory Visit: Payer: Self-pay

## 2021-02-01 ENCOUNTER — Encounter: Payer: Self-pay | Admitting: Nurse Practitioner

## 2021-02-01 ENCOUNTER — Ambulatory Visit (INDEPENDENT_AMBULATORY_CARE_PROVIDER_SITE_OTHER): Payer: Medicare Other | Admitting: Nurse Practitioner

## 2021-02-01 VITALS — BP 106/64 | HR 60 | Temp 96.8°F | Ht 64.0 in | Wt 214.8 lb

## 2021-02-01 DIAGNOSIS — E119 Type 2 diabetes mellitus without complications: Secondary | ICD-10-CM | POA: Insufficient documentation

## 2021-02-01 DIAGNOSIS — E1165 Type 2 diabetes mellitus with hyperglycemia: Secondary | ICD-10-CM | POA: Diagnosis not present

## 2021-02-01 DIAGNOSIS — F411 Generalized anxiety disorder: Secondary | ICD-10-CM | POA: Diagnosis not present

## 2021-02-01 DIAGNOSIS — Z23 Encounter for immunization: Secondary | ICD-10-CM

## 2021-02-01 NOTE — Assessment & Plan Note (Signed)
Chronic, waxing and waning, worse since cardiac surgery 2020. Hx of GAD and panic disorder: Current use of citalopram 40mg  and klonopin 0.5mg  1.5tab at hs x 55yrs. Fhx of anxiety: mother, father and brother. Etoh: none Former tobacco : quit 2yrs ago No caffeine. No marijuana  No inpatient admission Today she is requesting for referral to psychologist. Order entered PMP database indicates klonopin 0.5mg , #45, last filled 01/25/2021, 12/22/20, 11/27/2020.

## 2021-02-01 NOTE — Progress Notes (Signed)
Subjective:  Patient ID: Kimberly Hobbs, female    DOB: [birth year]  Age: 60 y.o. MRN: [id number]  CC: Establish Care (New patient/ Pt would like to discuss referral for counseling. )  HPI Previous pcp with [facility]. Last OV 93months ago.  Type 2 diabetes mellitus with hyperglycemia, without long-term current use of insulin ([place]) diagnosed 58yrs Last hgbA1c of 6.2% Current use of metformin Normal foot exam, denies any neuropathy Up to date with Dm eye exam, report requested F/up in 40months  GAD (generalized anxiety disorder) Chronic, waxing and waning, worse since cardiac surgery 2020. Hx of GAD and panic disorder: Current use of citalopram 40mg  and klonopin 0.5mg  1.5tab at hs x 29yrs. Fhx of anxiety: mother, father and brother. Etoh: none Former tobacco : quit 12yrs ago No caffeine. No marijuana  No inpatient admission Today she is requesting for referral to psychologist. Order entered PMP database indicates klonopin 0.5mg , #45, last filled 01/25/2021, 12/22/20, 11/27/2020. Depression screen [facility] 2/9 02/01/2021 03/15/2019 12/09/2017  Decreased Interest 1 1 0  Down, Depressed, Hopeless 1 2 0  PHQ - 2 Score 2 3 0  Altered sleeping 1 - -  Tired, decreased energy 1 - -  Change in appetite 1 - -  Feeling bad or failure about yourself  1 - -  Trouble concentrating 0 - -  Moving slowly or fidgety/restless 0 - -  Suicidal thoughts 0 - -  PHQ-9 Score 6 - -    GAD 7 : Generalized Anxiety Score 02/01/2021  Nervous, Anxious, on Edge 1  Control/stop worrying 2  Worry too much - different things 3  Trouble relaxing 1  Restless 0  Easily annoyed or irritable 0  Afraid - awful might happen 0  Total GAD 7 Score 7   Reviewed past Medical, Social and Family history today.  Outpatient Medications Prior to Visit  Medication Sig Dispense Refill   acetaminophen (TYLENOL) 500 MG tablet Take 500 mg by mouth every 6 (six) hours as needed (for pain.).     albuterol (VENTOLIN HFA) 108  (90 Base) MCG/ACT inhaler Inhale 2 puffs into the lungs every 6 (six) hours as needed for wheezing or shortness of breath.      amiodarone (PACERONE) 200 MG tablet TAKE 1 TABLET(200 MG) BY MOUTH DAILY 60 tablet 1   aspirin EC 81 MG tablet Take 81 mg by mouth daily.      citalopram (CELEXA) 40 MG tablet Take 20 mg by mouth 2 (two) times daily.     clonazePAM (KLONOPIN) 0.5 MG tablet Take 0.5 mg by mouth at bedtime.      diltiazem (CARDIZEM CD) 180 MG 24 hr capsule TAKE ONE CAPSULE BY MOUTH DAILY 90 capsule 2   furosemide (LASIX) 20 MG tablet TAKE 1 TABLET BY MOUTH DAILY 90 tablet 2   isosorbide mononitrate (IMDUR) 60 MG 24 hr tablet Take 1 tablet (60 mg total) by mouth daily. 90 tablet 3   Lancets (ONETOUCH DELICA PLUS [id number]) MISC TAKE AS DIRECTED ONCE DAILY.     levalbuterol (XOPENEX HFA) 45 MCG/ACT inhaler Inhale 1-2 puffs into the lungs every 6 (six) hours as needed for shortness of breath. 1 Inhaler 12   losartan (COZAAR) 50 MG tablet TAKE 1 TABLET BY MOUTH DAILY 30 tablet 6   metFORMIN (GLUCOPHAGE) 500 MG tablet Take 500 mg by mouth 2 (two) times daily.     metoprolol tartrate (LOPRESSOR) 50 MG tablet Take 1 tablet (50 mg total) by mouth 2 (two) times daily.  60 tablet 3   nitroGLYCERIN (NITROSTAT) 0.4 MG SL tablet Place 1 tablet (0.4 mg total) under the tongue every 5 (five) minutes x 3 doses as needed for chest pain. 25 tablet 3   ONETOUCH VERIO test strip AS DIRECTED ONCE A DAY     pantoprazole (PROTONIX) 40 MG tablet TAKE 1 TABLET(40 MG) BY MOUTH DAILY 90 tablet 1   rosuvastatin (CRESTOR) 40 MG tablet Take 1 tablet (40 mg total) by mouth daily. 90 tablet 1   spironolactone (ALDACTONE) 50 MG tablet TAKE 1 TABLET(50 MG) BY MOUTH DAILY 30 tablet 6   STIOLTO RESPIMAT 2.5-2.5 MCG/ACT AERS INHALE 2 PUFFS INTO THE LUNGS DAILY 4 g 5   warfarin (COUMADIN) 5 MG tablet Take 1 tablet (5 mg total) by mouth daily. Refer to most recent anticoagulation note for most accurate information. (Patient  taking differently: Take 5 mg by mouth daily. or as directed by coumadin clinic  Currently on: 5 mg every Wed; 7.5 mg all other days) 90 tablet 2   warfarin (COUMADIN) 7.5 MG tablet TAKE 1 TABLET BY MOUTH EVERY EVENING (Patient taking differently: 5 mg every Wed; 7.5 mg all other days or as directed by coumadin clinic) 90 tablet 2   No facility-administered medications prior to visit.    ROS See HPI  Objective:  BP 106/64 (BP Location: Left Arm, Patient Position: Sitting, Cuff Size: Large)    Pulse 60    Temp (!) 96.8 F (36 C) (Temporal)    Ht 5\' 4"  (1.626 m)    Wt 214 lb 12.8 oz (97.4 kg)    SpO2 98%    BMI 36.87 kg/m   Physical Exam Constitutional:      General: She is not in acute distress. Cardiovascular:     Rate and Rhythm: Normal rate and regular rhythm.     Pulses: Normal pulses.     Heart sounds: Normal heart sounds.  Pulmonary:     Effort: Pulmonary effort is normal.     Breath sounds: Normal breath sounds.  Musculoskeletal:     Right lower leg: No edema.     Left lower leg: No edema.  Neurological:     Mental Status: She is alert and oriented to person, place, and time.  Psychiatric:        Mood and Affect: Mood normal.        Behavior: Behavior normal.        Thought Content: Thought content normal.   Assessment & Plan:  This visit occurred during the SARS-CoV-2 public health emergency.  Safety protocols were in place, including screening questions prior to the visit, additional usage of staff PPE, and extensive cleaning of exam room while observing appropriate contact time as indicated for disinfecting solutions.   [name] was seen today for establish care.  Diagnoses and all orders for this visit:  GAD (generalized anxiety disorder) -     Ambulatory referral to Psychology  Type 2 diabetes mellitus with hyperglycemia, without long-term current use of insulin ([place])  Flu vaccine need -     Flu Vaccine QUAD High Dose(Fluad)  Sign medical release to get  records from ophthalmology, [facility] and [place].  Problem List Items Addressed This Visit       Endocrine   Type 2 diabetes mellitus with hyperglycemia, without long-term current use of insulin ([place])    diagnosed 52yrs Last hgbA1c of 6.2% Current use of metformin Normal foot exam, denies any neuropathy Up to date with Dm  eye exam, report requested F/up in 75months        Other   GAD (generalized anxiety disorder) - Primary    Chronic, waxing and waning, worse since cardiac surgery 2020. Hx of GAD and panic disorder: Current use of citalopram 40mg  and klonopin 0.5mg  1.5tab at hs x 19yrs. Fhx of anxiety: mother, father and brother. Etoh: none Former tobacco : quit 5yrs ago No caffeine. No marijuana  No inpatient admission Today she is requesting for referral to psychologist. Order entered PMP database indicates klonopin 0.5mg , #45, last filled 01/25/2021, 12/22/20, 11/27/2020.      Relevant Orders   Ambulatory referral to Psychology   Other Visit Diagnoses     Flu vaccine need       Relevant Orders   Flu Vaccine QUAD High Dose(Fluad) (Completed)       Follow-up: Return in about 3 months (around 05/02/2021) for CPE (fasting, breast and pelvic exam).  Wilfred Lacy, NP

## 2021-02-01 NOTE — Assessment & Plan Note (Signed)
diagnosed 70yrs Last hgbA1c of 6.2% Current use of metformin Normal foot exam, denies any neuropathy Up to date with Dm eye exam, report requested F/up in 18months

## 2021-02-01 NOTE — Patient Instructions (Addendum)
Sign medical release to get records from ophthalmology, [facility] and [place].  You will be contacted to schedule appt with therapist.  Thank you for choosing [facility]

## 2021-02-02 ENCOUNTER — Other Ambulatory Visit: Payer: Self-pay | Admitting: Student

## 2021-02-02 DIAGNOSIS — I480 Paroxysmal atrial fibrillation: Secondary | ICD-10-CM

## 2021-02-02 NOTE — Telephone Encounter (Signed)
Ok to refill 

## 2021-02-06 ENCOUNTER — Ambulatory Visit: Payer: Self-pay | Admitting: Pharmacist

## 2021-02-06 DIAGNOSIS — Z952 Presence of prosthetic heart valve: Secondary | ICD-10-CM

## 2021-02-06 DIAGNOSIS — Z5181 Encounter for therapeutic drug level monitoring: Secondary | ICD-10-CM

## 2021-02-06 DIAGNOSIS — Z7901 Long term (current) use of anticoagulants: Secondary | ICD-10-CM

## 2021-02-06 LAB — POCT INR: INR: 2.9 (ref 2.0–3.0)

## 2021-02-06 NOTE — Patient Instructions (Signed)
INR at goal. Continue current weekly dose of 5 mg every Tues, Thurs, Sat and 7.5 mg all other days. Recheck INR in 1 week

## 2021-02-06 NOTE — Progress Notes (Signed)
Anticoagulation Management Kimberly Hobbs is a 60 y.o. female who reports to the clinic for monitoring of warfarin treatment.    Indication: atrial fibrillation and Hx of Mechanical mitral and aortic valve replacement  ; CHA2DS2 Vasc Score 4 (Female, HTN, DM, Vascular disease hx), HAS-BLED 1 (ASA, prior bleed hx (hematuria, hematochezia) Duration: indefinite Supervising physician: [facility] Visit History:  Patient does not report signs/symptoms of bleeding or thromboembolism   Other recent changes: No changes in diet, medications, lifestyle.   Pt maintaining steady Vit-K intake of spinach salad 3-4/week.  Stable on amiodarone 200 mg daily.   Anticoagulation Episode Summary     Current INR goal:  2.5-3.5  TTR:  57.0 % (2.8 y)  Next INR check:  02/13/2021  INR from last check:  2.9 (02/06/2021)  Weekly max warfarin dose:    Target end date:  Indefinite  INR check location:    Preferred lab:    Send INR reminders to:     Indications   Status post mechanical aortic valve replacement [Z95.2] Monitoring for long-term anticoagulant use [Z51.81 Z79.01]        Comments:           Allergies  Allergen Reactions   Iodinated Diagnostic Agents Other (See Comments)    Patient is unsure of reaction type   Penicillins Other (See Comments)    Immune to drug , does not work per patient  Did it involve swelling of the face/tongue/throat, SOB, or low BP? No Did it involve sudden or severe rash/hives, skin peeling, or any reaction on the inside of your mouth or nose? No Did you need to seek medical attention at a hospital or doctor's office? No When did it last happen? NOT A TRUE ALLERGY   If all above answers are NO, may proceed with cephalosporin use.     Current Outpatient Medications:    acetaminophen (TYLENOL) 500 MG tablet, Take 500 mg by mouth every 6 (six) hours as needed (for pain.)., Disp: , Rfl:    albuterol (VENTOLIN HFA) 108 (90 Base)  MCG/ACT inhaler, Inhale 2 puffs into the lungs every 6 (six) hours as needed for wheezing or shortness of breath. , Disp: , Rfl:    amiodarone (PACERONE) 200 MG tablet, TAKE 1 TABLET(200 MG) BY MOUTH DAILY, Disp: 60 tablet, Rfl: 1   aspirin EC 81 MG tablet, Take 81 mg by mouth daily. , Disp: , Rfl:    citalopram (CELEXA) 40 MG tablet, Take 20 mg by mouth 2 (two) times daily., Disp: , Rfl:    clonazePAM (KLONOPIN) 0.5 MG tablet, Take 0.5 mg by mouth at bedtime. , Disp: , Rfl:    diltiazem (CARDIZEM CD) 180 MG 24 hr capsule, TAKE ONE CAPSULE BY MOUTH DAILY, Disp: 90 capsule, Rfl: 2   furosemide (LASIX) 20 MG tablet, TAKE 1 TABLET BY MOUTH DAILY, Disp: 90 tablet, Rfl: 2   isosorbide mononitrate (IMDUR) 60 MG 24 hr tablet, Take 1 tablet (60 mg total) by mouth daily., Disp: 90 tablet, Rfl: 3   Lancets (ONETOUCH DELICA PLUS [id number]) MISC, TAKE AS DIRECTED ONCE DAILY., Disp: , Rfl:    levalbuterol (XOPENEX HFA) 45 MCG/ACT inhaler, Inhale 1-2 puffs into the lungs every 6 (six) hours as needed for shortness of breath., Disp: 1 Inhaler, Rfl: 12   losartan (COZAAR) 50 MG tablet, TAKE 1 TABLET BY MOUTH DAILY, Disp: 30 tablet, Rfl: 6   metFORMIN (GLUCOPHAGE) 500 MG tablet, Take 500 mg by mouth 2 (two)  times daily., Disp: , Rfl:    metoprolol tartrate (LOPRESSOR) 50 MG tablet, Take 1 tablet (50 mg total) by mouth 2 (two) times daily., Disp: 60 tablet, Rfl: 3   nitroGLYCERIN (NITROSTAT) 0.4 MG SL tablet, Place 1 tablet (0.4 mg total) under the tongue every 5 (five) minutes x 3 doses as needed for chest pain., Disp: 25 tablet, Rfl: 3   ONETOUCH VERIO test strip, AS DIRECTED ONCE A DAY, Disp: , Rfl:    pantoprazole (PROTONIX) 40 MG tablet, TAKE 1 TABLET(40 MG) BY MOUTH DAILY, Disp: 90 tablet, Rfl: 1   rosuvastatin (CRESTOR) 40 MG tablet, Take 1 tablet (40 mg total) by mouth daily., Disp: 90 tablet, Rfl: 1   spironolactone (ALDACTONE) 50 MG tablet, TAKE 1 TABLET(50 MG) BY MOUTH DAILY, Disp: 30 tablet, Rfl: 6    STIOLTO RESPIMAT 2.5-2.5 MCG/ACT AERS, INHALE 2 PUFFS INTO THE LUNGS DAILY, Disp: 4 g, Rfl: 5   warfarin (COUMADIN) 5 MG tablet, Take 1 tablet (5 mg total) by mouth daily. Refer to most recent anticoagulation note for most accurate information. (Patient taking differently: Take 5 mg by mouth daily. or as directed by coumadin clinic  Currently on: 5 mg every Wed; 7.5 mg all other days), Disp: 90 tablet, Rfl: 2   warfarin (COUMADIN) 7.5 MG tablet, TAKE 1 TABLET BY MOUTH EVERY EVENING (Patient taking differently: 5 mg every Wed; 7.5 mg all other days or as directed by coumadin clinic), Disp: 90 tablet, Rfl: 2 Past Medical History:  Diagnosis Date   COVID-19    Hyperlipidemia    Hypertension    Rheumatic heart disease    MS/ AS    ASSESSMENT  Recent Results: The most recent result is correlated with 45 mg per week:  Lab Results  Component Value Date   INR 2.9 02/06/2021   INR 3.0 01/30/2021   INR 2.6 01/22/2021    Anticoagulation Dosing: Description   INR at goal. Continue current weekly dose of 5 mg every Tues, Thurs, Sat and 7.5 mg all other days. Recheck INR in 1 week     INR today: Therapeutic. INR continues to remain therapeutic on current weekly dose Pt notes stable Vit K intake. Denies any unusual bleeding or bruising symptoms. Denies any other relevant changes in diet, medications, or lifestyle. Amiodarone dose stable at 200 mg. Will continue current weekly dose and continue remote monitoring.   PLAN Weekly dose was unchanged by 0 % to 45 mg/week. Continue current weekly dose of 5 mg every Tues, Thurs, Sat and 7.5 mg all other days. Recheck INR in 1 week.   Patient Instructions  INR at goal. Continue current weekly dose of 5 mg every Tues, Thurs, Sat and 7.5 mg all other days. Recheck INR in 1 week Patient advised to contact clinic or seek medical attention if signs/symptoms of bleeding or thromboembolism occur.  Patient verbalized understanding by repeating back information  and was advised to contact me if further medication-related questions arise.   Follow-up Return in about 1 week (around 02/13/2021).  [name], PharmD  15 minutes spent face-to-face with the patient during the encounter. 50% of time spent on education, including signs/sx bleeding and clotting, as well as food and drug interactions with warfarin. 50% of time was spent on fingerprick POC INR sample collection,processing, results determination, and documentation

## 2021-02-20 ENCOUNTER — Ambulatory Visit: Payer: Self-pay | Admitting: Pharmacist

## 2021-02-20 DIAGNOSIS — Z952 Presence of prosthetic heart valve: Secondary | ICD-10-CM | POA: Diagnosis not present

## 2021-02-20 DIAGNOSIS — Z7901 Long term (current) use of anticoagulants: Secondary | ICD-10-CM | POA: Diagnosis not present

## 2021-02-20 DIAGNOSIS — Z5181 Encounter for therapeutic drug level monitoring: Secondary | ICD-10-CM | POA: Diagnosis not present

## 2021-02-20 LAB — POCT INR: INR: 2.8 (ref 2.0–3.0)

## 2021-02-20 NOTE — Patient Instructions (Signed)
INR at goal. Continue current weekly dose of 5 mg every Tues, Thurs, Sat and 7.5 mg all other days. Recheck INR in 1 week

## 2021-02-20 NOTE — Progress Notes (Signed)
Anticoagulation Management Kimberly Hobbs is a 61 y.o. female who reports to the clinic for monitoring of warfarin treatment.    Indication: atrial fibrillation and Hx of Mechanical mitral and aortic valve replacement  ; CHA2DS2 Vasc Score 4 (Female, HTN, DM, Vascular disease hx), HAS-BLED 1 (ASA, prior bleed hx (hematuria, hematochezia) Duration: indefinite Supervising physician: [facility] Visit History:  Patient does not report signs/symptoms of bleeding or thromboembolism   Other recent changes: No changes in diet, medications, lifestyle.   Pt maintaining steady Vit-K intake of spinach salad 3-4/week.  Stable on amiodarone 200 mg daily.   Anticoagulation Episode Summary     Current INR goal:  2.5-3.5  TTR:  57.2 % (2.9 y)  Next INR check:  02/27/2021  INR from last check:  2.8 (02/20/2021)  Weekly max warfarin dose:    Target end date:  Indefinite  INR check location:    Preferred lab:    Send INR reminders to:     Indications   Status post mechanical aortic valve replacement [Z95.2] Monitoring for long-term anticoagulant use [Z51.81 Z79.01]        Comments:           Allergies  Allergen Reactions   Iodinated Contrast Media Other (See Comments)    Patient is unsure of reaction type   Penicillins Other (See Comments)    Immune to drug , does not work per patient  Did it involve swelling of the face/tongue/throat, SOB, or low BP? No Did it involve sudden or severe rash/hives, skin peeling, or any reaction on the inside of your mouth or nose? No Did you need to seek medical attention at a hospital or doctor's office? No When did it last happen? NOT A TRUE ALLERGY   If all above answers are NO, may proceed with cephalosporin use.     Current Outpatient Medications:    acetaminophen (TYLENOL) 500 MG tablet, Take 500 mg by mouth every 6 (six) hours as needed (for pain.)., Disp: , Rfl:    albuterol (VENTOLIN HFA) 108 (90 Base)  MCG/ACT inhaler, Inhale 2 puffs into the lungs every 6 (six) hours as needed for wheezing or shortness of breath. , Disp: , Rfl:    amiodarone (PACERONE) 200 MG tablet, TAKE 1 TABLET(200 MG) BY MOUTH DAILY, Disp: 60 tablet, Rfl: 1   aspirin EC 81 MG tablet, Take 81 mg by mouth daily. , Disp: , Rfl:    citalopram (CELEXA) 40 MG tablet, Take 20 mg by mouth 2 (two) times daily., Disp: , Rfl:    clonazePAM (KLONOPIN) 0.5 MG tablet, Take 0.5 mg by mouth at bedtime. , Disp: , Rfl:    diltiazem (CARDIZEM CD) 180 MG 24 hr capsule, TAKE ONE CAPSULE BY MOUTH DAILY, Disp: 90 capsule, Rfl: 2   furosemide (LASIX) 20 MG tablet, TAKE 1 TABLET BY MOUTH DAILY, Disp: 90 tablet, Rfl: 2   isosorbide mononitrate (IMDUR) 60 MG 24 hr tablet, Take 1 tablet (60 mg total) by mouth daily., Disp: 90 tablet, Rfl: 3   Lancets (ONETOUCH DELICA PLUS [id number]) MISC, TAKE AS DIRECTED ONCE DAILY., Disp: , Rfl:    levalbuterol (XOPENEX HFA) 45 MCG/ACT inhaler, Inhale 1-2 puffs into the lungs every 6 (six) hours as needed for shortness of breath., Disp: 1 Inhaler, Rfl: 12   losartan (COZAAR) 50 MG tablet, TAKE 1 TABLET BY MOUTH DAILY, Disp: 30 tablet, Rfl: 6   metFORMIN (GLUCOPHAGE) 500 MG tablet, Take 500 mg by mouth 2 (two)  times daily., Disp: , Rfl:    metoprolol tartrate (LOPRESSOR) 50 MG tablet, Take 1 tablet (50 mg total) by mouth 2 (two) times daily., Disp: 60 tablet, Rfl: 3   nitroGLYCERIN (NITROSTAT) 0.4 MG SL tablet, Place 1 tablet (0.4 mg total) under the tongue every 5 (five) minutes x 3 doses as needed for chest pain., Disp: 25 tablet, Rfl: 3   ONETOUCH VERIO test strip, AS DIRECTED ONCE A DAY, Disp: , Rfl:    pantoprazole (PROTONIX) 40 MG tablet, TAKE 1 TABLET(40 MG) BY MOUTH DAILY, Disp: 90 tablet, Rfl: 1   rosuvastatin (CRESTOR) 40 MG tablet, Take 1 tablet (40 mg total) by mouth daily., Disp: 90 tablet, Rfl: 1   spironolactone (ALDACTONE) 50 MG tablet, TAKE 1 TABLET(50 MG) BY MOUTH DAILY, Disp: 30 tablet, Rfl: 6    STIOLTO RESPIMAT 2.5-2.5 MCG/ACT AERS, INHALE 2 PUFFS INTO THE LUNGS DAILY, Disp: 4 g, Rfl: 5   warfarin (COUMADIN) 5 MG tablet, Take 1 tablet (5 mg total) by mouth daily. Refer to most recent anticoagulation note for most accurate information. (Patient taking differently: Take 5 mg by mouth daily. or as directed by coumadin clinic  Currently on: 5 mg every Wed; 7.5 mg all other days), Disp: 90 tablet, Rfl: 2   warfarin (COUMADIN) 7.5 MG tablet, TAKE 1 TABLET BY MOUTH EVERY EVENING (Patient taking differently: 5 mg every Wed; 7.5 mg all other days or as directed by coumadin clinic), Disp: 90 tablet, Rfl: 2 Past Medical History:  Diagnosis Date   COVID-19    Hyperlipidemia    Hypertension    Rheumatic heart disease    MS/ AS    ASSESSMENT  Recent Results: The most recent result is correlated with 45 mg per week:  Lab Results  Component Value Date   INR 2.8 02/20/2021   INR 2.9 02/06/2021   INR 3.0 01/30/2021    Anticoagulation Dosing: Description   INR at goal. Continue current weekly dose of 5 mg every Tues, Thurs, Sat and 7.5 mg all other days. Recheck INR in 1 week     INR today: Therapeutic. INR continues to remain therapeutic on current weekly dose Pt notes stable Vit K intake. Denies any unusual bleeding or bruising symptoms. Denies any other relevant changes in diet, medications, or lifestyle. Amiodarone dose stable at 200 mg. Will continue current weekly dose and continue remote monitoring.   PLAN Weekly dose was unchanged by 0 % to 45 mg/week. Continue current weekly dose of 5 mg every Tues, Thurs, Sat and 7.5 mg all other days. Recheck INR in 1 week.   Patient Instructions  INR at goal. Continue current weekly dose of 5 mg every Tues, Thurs, Sat and 7.5 mg all other days. Recheck INR in 1 week Patient advised to contact clinic or seek medical attention if signs/symptoms of bleeding or thromboembolism occur.  Patient verbalized understanding by repeating back information  and was advised to contact me if further medication-related questions arise.   Follow-up Return in about 1 week (around 02/27/2021).  [name], PharmD  15 minutes spent face-to-face with the patient during the encounter. 50% of time spent on education, including signs/sx bleeding and clotting, as well as food and drug interactions with warfarin. 50% of time was spent on fingerprick POC INR sample collection,processing, results determination, and documentation

## 2021-02-26 ENCOUNTER — Ambulatory Visit: Payer: Self-pay | Admitting: Pharmacist

## 2021-02-26 DIAGNOSIS — Z7901 Long term (current) use of anticoagulants: Secondary | ICD-10-CM

## 2021-02-26 DIAGNOSIS — Z5181 Encounter for therapeutic drug level monitoring: Secondary | ICD-10-CM | POA: Diagnosis not present

## 2021-02-26 DIAGNOSIS — Z952 Presence of prosthetic heart valve: Secondary | ICD-10-CM | POA: Diagnosis not present

## 2021-02-26 DIAGNOSIS — I480 Paroxysmal atrial fibrillation: Secondary | ICD-10-CM | POA: Diagnosis not present

## 2021-02-26 LAB — POCT INR: INR: 3.1 — AB (ref 2.0–3.0)

## 2021-02-26 NOTE — Progress Notes (Signed)
Anticoagulation Management Kimberly Hobbs is a 61 y.o. female who reports to the clinic for monitoring of warfarin treatment.    Indication: atrial fibrillation and Hx of Mechanical mitral and aortic valve replacement  ; CHA2DS2 Vasc Score 4 (Female, HTN, DM, Vascular disease hx), HAS-BLED 1 (ASA, prior bleed hx (hematuria, hematochezia) Duration: indefinite Supervising physician: [facility] Visit History:  Patient does not report signs/symptoms of bleeding or thromboembolism   Other recent changes: No changes in diet, medications, lifestyle.   Pt maintaining steady Vit-K intake of spinach salad 3-4/week.  Stable on amiodarone 200 mg daily.   Anticoagulation Episode Summary     Current INR goal:  2.5-3.5  TTR:  57.4 % (2.9 y)  Next INR check:  03/05/2021  INR from last check:  3.1 (02/26/2021)  Weekly max warfarin dose:    Target end date:  Indefinite  INR check location:    Preferred lab:    Send INR reminders to:     Indications   Status post mechanical aortic valve replacement [Z95.2] Monitoring for long-term anticoagulant use [Z51.81 Z79.01]        Comments:           Allergies  Allergen Reactions   Iodinated Contrast Media Other (See Comments)    Patient is unsure of reaction type   Penicillins Other (See Comments)    Immune to drug , does not work per patient  Did it involve swelling of the face/tongue/throat, SOB, or low BP? No Did it involve sudden or severe rash/hives, skin peeling, or any reaction on the inside of your mouth or nose? No Did you need to seek medical attention at a hospital or doctor's office? No When did it last happen? NOT A TRUE ALLERGY   If all above answers are NO, may proceed with cephalosporin use.     Current Outpatient Medications:    acetaminophen (TYLENOL) 500 MG tablet, Take 500 mg by mouth every 6 (six) hours as needed (for pain.)., Disp: , Rfl:    albuterol (VENTOLIN HFA) 108 (90 Base)  MCG/ACT inhaler, Inhale 2 puffs into the lungs every 6 (six) hours as needed for wheezing or shortness of breath. , Disp: , Rfl:    amiodarone (PACERONE) 200 MG tablet, TAKE 1 TABLET(200 MG) BY MOUTH DAILY, Disp: 60 tablet, Rfl: 1   aspirin EC 81 MG tablet, Take 81 mg by mouth daily. , Disp: , Rfl:    citalopram (CELEXA) 40 MG tablet, Take 20 mg by mouth 2 (two) times daily., Disp: , Rfl:    clonazePAM (KLONOPIN) 0.5 MG tablet, Take 0.5 mg by mouth at bedtime. , Disp: , Rfl:    diltiazem (CARDIZEM CD) 180 MG 24 hr capsule, TAKE ONE CAPSULE BY MOUTH DAILY, Disp: 90 capsule, Rfl: 2   furosemide (LASIX) 20 MG tablet, TAKE 1 TABLET BY MOUTH DAILY, Disp: 90 tablet, Rfl: 2   isosorbide mononitrate (IMDUR) 60 MG 24 hr tablet, Take 1 tablet (60 mg total) by mouth daily., Disp: 90 tablet, Rfl: 3   Lancets (ONETOUCH DELICA PLUS [id number]) MISC, TAKE AS DIRECTED ONCE DAILY., Disp: , Rfl:    levalbuterol (XOPENEX HFA) 45 MCG/ACT inhaler, Inhale 1-2 puffs into the lungs every 6 (six) hours as needed for shortness of breath., Disp: 1 Inhaler, Rfl: 12   losartan (COZAAR) 50 MG tablet, TAKE 1 TABLET BY MOUTH DAILY, Disp: 30 tablet, Rfl: 6   metFORMIN (GLUCOPHAGE) 500 MG tablet, Take 500 mg by mouth 2 (two)  times daily., Disp: , Rfl:    metoprolol tartrate (LOPRESSOR) 50 MG tablet, Take 1 tablet (50 mg total) by mouth 2 (two) times daily., Disp: 60 tablet, Rfl: 3   nitroGLYCERIN (NITROSTAT) 0.4 MG SL tablet, Place 1 tablet (0.4 mg total) under the tongue every 5 (five) minutes x 3 doses as needed for chest pain., Disp: 25 tablet, Rfl: 3   ONETOUCH VERIO test strip, AS DIRECTED ONCE A DAY, Disp: , Rfl:    pantoprazole (PROTONIX) 40 MG tablet, TAKE 1 TABLET(40 MG) BY MOUTH DAILY, Disp: 90 tablet, Rfl: 1   rosuvastatin (CRESTOR) 40 MG tablet, Take 1 tablet (40 mg total) by mouth daily., Disp: 90 tablet, Rfl: 1   spironolactone (ALDACTONE) 50 MG tablet, TAKE 1 TABLET(50 MG) BY MOUTH DAILY, Disp: 30 tablet, Rfl: 6    STIOLTO RESPIMAT 2.5-2.5 MCG/ACT AERS, INHALE 2 PUFFS INTO THE LUNGS DAILY, Disp: 4 g, Rfl: 5   warfarin (COUMADIN) 5 MG tablet, Take 1 tablet (5 mg total) by mouth daily. Refer to most recent anticoagulation note for most accurate information. (Patient taking differently: Take 5 mg by mouth daily. or as directed by coumadin clinic  Currently on: 5 mg every Wed; 7.5 mg all other days), Disp: 90 tablet, Rfl: 2   warfarin (COUMADIN) 7.5 MG tablet, TAKE 1 TABLET BY MOUTH EVERY EVENING (Patient taking differently: 5 mg every Wed; 7.5 mg all other days or as directed by coumadin clinic), Disp: 90 tablet, Rfl: 2 Past Medical History:  Diagnosis Date   COVID-19    Hyperlipidemia    Hypertension    Rheumatic heart disease    MS/ AS    ASSESSMENT  Recent Results: The most recent result is correlated with 45 mg per week:  Lab Results  Component Value Date   INR 3.1 (A) 02/26/2021   INR 2.8 02/20/2021   INR 2.9 02/06/2021    Anticoagulation Dosing: Description   INR at goal. Continue current weekly dose of 5 mg every Tues, Thurs, Sat and 7.5 mg all other days. Recheck INR in 1 week     INR today: Therapeutic. INR continues to remain therapeutic on current weekly dose Pt notes stable Vit K intake. Denies any unusual bleeding or bruising symptoms. Denies any other relevant changes in diet, medications, or lifestyle. Amiodarone dose stable at 200 mg. Will continue current weekly dose and continue remote monitoring.   PLAN Weekly dose was unchanged by 0 % to 45 mg/week. Continue current weekly dose of 5 mg every Tues, Thurs, Sat and 7.5 mg all other days. Recheck INR in 1 week.   Patient Instructions  INR at goal. Continue current weekly dose of 5 mg every Tues, Thurs, Sat and 7.5 mg all other days. Recheck INR in 1 week Patient advised to contact clinic or seek medical attention if signs/symptoms of bleeding or thromboembolism occur.  Patient verbalized understanding by repeating back  information and was advised to contact me if further medication-related questions arise.   Follow-up Return in about 1 week (around 03/05/2021).  [name], PharmD  15 minutes spent face-to-face with the patient during the encounter. 50% of time spent on education, including signs/sx bleeding and clotting, as well as food and drug interactions with warfarin. 50% of time was spent on fingerprick POC INR sample collection,processing, results determination, and documentation

## 2021-02-26 NOTE — Patient Instructions (Signed)
INR at goal. Continue current weekly dose of 5 mg every Tues, Thurs, Sat and 7.5 mg all other days. Recheck INR in 1 week

## 2021-03-01 ENCOUNTER — Telehealth: Payer: Self-pay | Admitting: Nurse Practitioner

## 2021-03-01 DIAGNOSIS — F411 Generalized anxiety disorder: Secondary | ICD-10-CM

## 2021-03-01 MED ORDER — CLONAZEPAM 0.5 MG PO TABS: 0.5000 mg | ORAL_TABLET | Freq: Every day | ORAL | 0 refills | Status: DC

## 2021-03-02 ENCOUNTER — Ambulatory Visit: Payer: 59 | Admitting: Internal Medicine

## 2021-03-02 ENCOUNTER — Encounter: Payer: Self-pay | Admitting: Nurse Practitioner

## 2021-03-02 MED ORDER — CLONAZEPAM 0.5 MG PO TABS: 0.7500 mg | ORAL_TABLET | Freq: Every day | ORAL | 0 refills | Status: DC

## 2021-03-02 NOTE — Addendum Note (Signed)
Addended by: Wilfred Lacy L on: 03/02/2021 04:25 PM   Modules accepted: Orders

## 2021-03-02 NOTE — Telephone Encounter (Signed)
Pt said she needs 45 not 30 pills.Marland Kitchen bc she takes one and a half at night.

## 2021-03-02 NOTE — Telephone Encounter (Signed)
Please advise 

## 2021-03-06 ENCOUNTER — Ambulatory Visit: Payer: Self-pay | Admitting: Pharmacist

## 2021-03-06 DIAGNOSIS — Z7901 Long term (current) use of anticoagulants: Secondary | ICD-10-CM | POA: Diagnosis not present

## 2021-03-06 DIAGNOSIS — Z952 Presence of prosthetic heart valve: Secondary | ICD-10-CM

## 2021-03-06 DIAGNOSIS — Z5181 Encounter for therapeutic drug level monitoring: Secondary | ICD-10-CM | POA: Diagnosis not present

## 2021-03-06 LAB — POCT INR: INR: 3.1 — AB (ref 2.0–3.0)

## 2021-03-06 NOTE — Patient Instructions (Signed)
INR at goal. Continue current weekly dose of 5 mg every Tues, Thurs, Sat and 7.5 mg all other days. Recheck INR in 1 week

## 2021-03-06 NOTE — Progress Notes (Signed)
Anticoagulation Management Kimberly Hobbs is a 61 y.o. female who reports to the clinic for monitoring of warfarin treatment.    Indication: atrial fibrillation and Hx of Mechanical mitral and aortic valve replacement  ; CHA2DS2 Vasc Score 4 (Female, HTN, DM, Vascular disease hx), HAS-BLED 1 (ASA, prior bleed hx (hematuria, hematochezia) Duration: indefinite Supervising physician: [facility] Visit History:  Patient does not report signs/symptoms of bleeding or thromboembolism   Other recent changes: No changes in diet, medications, lifestyle.   Pt maintaining steady Vit-K intake of spinach salad 3-4/week.  Stable on amiodarone 200 mg daily.   Anticoagulation Episode Summary     Current INR goal:  2.5-3.5  TTR:  57.7 % (2.9 y)  Next INR check:  03/13/2021  INR from last check:  3.1 (03/06/2021)  Weekly max warfarin dose:    Target end date:  Indefinite  INR check location:    Preferred lab:    Send INR reminders to:     Indications   Status post mechanical aortic valve replacement [Z95.2] Monitoring for long-term anticoagulant use [Z51.81 Z79.01]        Comments:           Allergies  Allergen Reactions   Iodinated Contrast Media Other (See Comments)    Patient is unsure of reaction type   Penicillins Other (See Comments)    Immune to drug , does not work per patient  Did it involve swelling of the face/tongue/throat, SOB, or low BP? No Did it involve sudden or severe rash/hives, skin peeling, or any reaction on the inside of your mouth or nose? No Did you need to seek medical attention at a hospital or doctor's office? No When did it last happen? NOT A TRUE ALLERGY   If all above answers are NO, may proceed with cephalosporin use.     Current Outpatient Medications:    acetaminophen (TYLENOL) 500 MG tablet, Take 500 mg by mouth every 6 (six) hours as needed (for pain.)., Disp: , Rfl:    albuterol (VENTOLIN HFA) 108 (90 Base)  MCG/ACT inhaler, Inhale 2 puffs into the lungs every 6 (six) hours as needed for wheezing or shortness of breath. , Disp: , Rfl:    amiodarone (PACERONE) 200 MG tablet, TAKE 1 TABLET(200 MG) BY MOUTH DAILY, Disp: 60 tablet, Rfl: 1   aspirin EC 81 MG tablet, Take 81 mg by mouth daily. , Disp: , Rfl:    citalopram (CELEXA) 40 MG tablet, Take 20 mg by mouth 2 (two) times daily., Disp: , Rfl:    clonazePAM (KLONOPIN) 0.5 MG tablet, Take 1.5 tablets (0.75 mg total) by mouth at bedtime., Disp: 45 tablet, Rfl: 0   diltiazem (CARDIZEM CD) 180 MG 24 hr capsule, TAKE ONE CAPSULE BY MOUTH DAILY, Disp: 90 capsule, Rfl: 2   furosemide (LASIX) 20 MG tablet, TAKE 1 TABLET BY MOUTH DAILY, Disp: 90 tablet, Rfl: 2   isosorbide mononitrate (IMDUR) 60 MG 24 hr tablet, Take 1 tablet (60 mg total) by mouth daily., Disp: 90 tablet, Rfl: 3   Lancets (ONETOUCH DELICA PLUS [id number]) MISC, TAKE AS DIRECTED ONCE DAILY., Disp: , Rfl:    levalbuterol (XOPENEX HFA) 45 MCG/ACT inhaler, Inhale 1-2 puffs into the lungs every 6 (six) hours as needed for shortness of breath., Disp: 1 Inhaler, Rfl: 12   losartan (COZAAR) 50 MG tablet, TAKE 1 TABLET BY MOUTH DAILY, Disp: 30 tablet, Rfl: 6   metFORMIN (GLUCOPHAGE) 500 MG tablet, Take 500 mg by  mouth 2 (two) times daily., Disp: , Rfl:    metoprolol tartrate (LOPRESSOR) 50 MG tablet, Take 1 tablet (50 mg total) by mouth 2 (two) times daily., Disp: 60 tablet, Rfl: 3   nitroGLYCERIN (NITROSTAT) 0.4 MG SL tablet, Place 1 tablet (0.4 mg total) under the tongue every 5 (five) minutes x 3 doses as needed for chest pain., Disp: 25 tablet, Rfl: 3   ONETOUCH VERIO test strip, AS DIRECTED ONCE A DAY, Disp: , Rfl:    pantoprazole (PROTONIX) 40 MG tablet, TAKE 1 TABLET(40 MG) BY MOUTH DAILY, Disp: 90 tablet, Rfl: 1   rosuvastatin (CRESTOR) 40 MG tablet, Take 1 tablet (40 mg total) by mouth daily., Disp: 90 tablet, Rfl: 1   spironolactone (ALDACTONE) 50 MG tablet, TAKE 1 TABLET(50 MG) BY MOUTH DAILY,  Disp: 30 tablet, Rfl: 6   STIOLTO RESPIMAT 2.5-2.5 MCG/ACT AERS, INHALE 2 PUFFS INTO THE LUNGS DAILY, Disp: 4 g, Rfl: 5   warfarin (COUMADIN) 5 MG tablet, Take 1 tablet (5 mg total) by mouth daily. Refer to most recent anticoagulation note for most accurate information. (Patient taking differently: Take 5 mg by mouth daily. or as directed by coumadin clinic  Currently on: 5 mg every Wed; 7.5 mg all other days), Disp: 90 tablet, Rfl: 2   warfarin (COUMADIN) 7.5 MG tablet, TAKE 1 TABLET BY MOUTH EVERY EVENING (Patient taking differently: 5 mg every Wed; 7.5 mg all other days or as directed by coumadin clinic), Disp: 90 tablet, Rfl: 2 Past Medical History:  Diagnosis Date   COVID-19    Hyperlipidemia    Hypertension    Rheumatic heart disease    MS/ AS    ASSESSMENT  Recent Results: The most recent result is correlated with 45 mg per week:  Lab Results  Component Value Date   INR 3.1 (A) 03/06/2021   INR 3.1 (A) 02/26/2021   INR 2.8 02/20/2021    Anticoagulation Dosing: Description   INR at goal. Continue current weekly dose of 5 mg every Tues, Thurs, Sat and 7.5 mg all other days. Recheck INR in 1 week     INR today: Therapeutic. INR continues to remain therapeutic on current weekly dose Pt notes stable Vit K intake. Denies any unusual bleeding or bruising symptoms. Denies any other relevant changes in diet, medications, or lifestyle. Amiodarone dose stable at 200 mg. Will continue current weekly dose and continue remote monitoring.   PLAN Weekly dose was unchanged by 0 % to 45 mg/week. Continue current weekly dose of 5 mg every Tues, Thurs, Sat and 7.5 mg all other days. Recheck INR in 1 week.   Patient Instructions  INR at goal. Continue current weekly dose of 5 mg every Tues, Thurs, Sat and 7.5 mg all other days. Recheck INR in 1 week Patient advised to contact clinic or seek medical attention if signs/symptoms of bleeding or thromboembolism occur.  Patient verbalized  understanding by repeating back information and was advised to contact me if further medication-related questions arise.   Follow-up Return in about 1 week (around 03/13/2021).  [name], PharmD  15 minutes spent face-to-face with the patient during the encounter. 50% of time spent on education, including signs/sx bleeding and clotting, as well as food and drug interactions with warfarin. 50% of time was spent on fingerprick POC INR sample collection,processing, results determination, and documentation

## 2021-03-12 ENCOUNTER — Ambulatory Visit: Payer: Self-pay | Admitting: Pharmacist

## 2021-03-12 DIAGNOSIS — Z952 Presence of prosthetic heart valve: Secondary | ICD-10-CM

## 2021-03-12 DIAGNOSIS — I4891 Unspecified atrial fibrillation: Secondary | ICD-10-CM | POA: Diagnosis not present

## 2021-03-12 DIAGNOSIS — Z5181 Encounter for therapeutic drug level monitoring: Secondary | ICD-10-CM

## 2021-03-12 DIAGNOSIS — Z7901 Long term (current) use of anticoagulants: Secondary | ICD-10-CM | POA: Diagnosis not present

## 2021-03-12 LAB — POCT INR: INR: 3.3 — AB (ref 2.0–3.0)

## 2021-03-12 NOTE — Patient Instructions (Signed)
INR at goal. Continue current weekly dose of 5 mg every Tues, Thurs, Sat and 7.5 mg all other days. Recheck INR in 1 week

## 2021-03-12 NOTE — Progress Notes (Signed)
Anticoagulation Management Kimberly Hobbs is a 61 y.o. female who reports to the clinic for monitoring of warfarin treatment.    Indication: atrial fibrillation and Hx of Mechanical mitral and aortic valve replacement  ; CHA2DS2 Vasc Score 4 (Female, HTN, DM, Vascular disease hx), HAS-BLED 1 (ASA, prior bleed hx (hematuria, hematochezia)  Duration: indefinite Supervising physician: [facility] Visit History:  Patient does not report signs/symptoms of bleeding or thromboembolism   Other recent changes: No changes in diet, medications, lifestyle.   Pt maintaining steady Vit-K intake of spinach salad 3-4/week.  Stable on amiodarone 200 mg daily.   Anticoagulation Episode Summary     Current INR goal:  2.5-3.5  TTR:  58.0 % (2.9 y)  Next INR check:  03/19/2021  INR from last check:  3.3 (03/12/2021)  Weekly max warfarin dose:    Target end date:  Indefinite  INR check location:    Preferred lab:    Send INR reminders to:     Indications   Status post mechanical aortic valve replacement [Z95.2] Monitoring for long-term anticoagulant use [Z51.81 Z79.01]        Comments:           Allergies  Allergen Reactions   Iodinated Contrast Media Other (See Comments)    Patient is unsure of reaction type   Penicillins Other (See Comments)    Immune to drug , does not work per patient  Did it involve swelling of the face/tongue/throat, SOB, or low BP? No Did it involve sudden or severe rash/hives, skin peeling, or any reaction on the inside of your mouth or nose? No Did you need to seek medical attention at a hospital or doctor's office? No When did it last happen? NOT A TRUE ALLERGY   If all above answers are NO, may proceed with cephalosporin use.     Current Outpatient Medications:    acetaminophen (TYLENOL) 500 MG tablet, Take 500 mg by mouth every 6 (six) hours as needed (for pain.)., Disp: , Rfl:    albuterol (VENTOLIN HFA) 108 (90 Base)  MCG/ACT inhaler, Inhale 2 puffs into the lungs every 6 (six) hours as needed for wheezing or shortness of breath. , Disp: , Rfl:    amiodarone (PACERONE) 200 MG tablet, TAKE 1 TABLET(200 MG) BY MOUTH DAILY, Disp: 60 tablet, Rfl: 1   aspirin EC 81 MG tablet, Take 81 mg by mouth daily. , Disp: , Rfl:    citalopram (CELEXA) 40 MG tablet, Take 20 mg by mouth 2 (two) times daily., Disp: , Rfl:    clonazePAM (KLONOPIN) 0.5 MG tablet, Take 1.5 tablets (0.75 mg total) by mouth at bedtime., Disp: 45 tablet, Rfl: 0   diltiazem (CARDIZEM CD) 180 MG 24 hr capsule, TAKE ONE CAPSULE BY MOUTH DAILY, Disp: 90 capsule, Rfl: 2   furosemide (LASIX) 20 MG tablet, TAKE 1 TABLET BY MOUTH DAILY, Disp: 90 tablet, Rfl: 2   isosorbide mononitrate (IMDUR) 60 MG 24 hr tablet, Take 1 tablet (60 mg total) by mouth daily., Disp: 90 tablet, Rfl: 3   Lancets (ONETOUCH DELICA PLUS [id number]) MISC, TAKE AS DIRECTED ONCE DAILY., Disp: , Rfl:    levalbuterol (XOPENEX HFA) 45 MCG/ACT inhaler, Inhale 1-2 puffs into the lungs every 6 (six) hours as needed for shortness of breath., Disp: 1 Inhaler, Rfl: 12   losartan (COZAAR) 50 MG tablet, TAKE 1 TABLET BY MOUTH DAILY, Disp: 30 tablet, Rfl: 6   metFORMIN (GLUCOPHAGE) 500 MG tablet, Take 500 mg  by mouth 2 (two) times daily., Disp: , Rfl:    metoprolol tartrate (LOPRESSOR) 50 MG tablet, Take 1 tablet (50 mg total) by mouth 2 (two) times daily., Disp: 60 tablet, Rfl: 3   nitroGLYCERIN (NITROSTAT) 0.4 MG SL tablet, Place 1 tablet (0.4 mg total) under the tongue every 5 (five) minutes x 3 doses as needed for chest pain., Disp: 25 tablet, Rfl: 3   ONETOUCH VERIO test strip, AS DIRECTED ONCE A DAY, Disp: , Rfl:    pantoprazole (PROTONIX) 40 MG tablet, TAKE 1 TABLET(40 MG) BY MOUTH DAILY, Disp: 90 tablet, Rfl: 1   rosuvastatin (CRESTOR) 40 MG tablet, Take 1 tablet (40 mg total) by mouth daily., Disp: 90 tablet, Rfl: 1   spironolactone (ALDACTONE) 50 MG tablet, TAKE 1 TABLET(50 MG) BY MOUTH DAILY,  Disp: 30 tablet, Rfl: 6   STIOLTO RESPIMAT 2.5-2.5 MCG/ACT AERS, INHALE 2 PUFFS INTO THE LUNGS DAILY, Disp: 4 g, Rfl: 5   warfarin (COUMADIN) 5 MG tablet, Take 1 tablet (5 mg total) by mouth daily. Refer to most recent anticoagulation note for most accurate information. (Patient taking differently: Take 5 mg by mouth daily. or as directed by coumadin clinic  Currently on: 5 mg every Wed; 7.5 mg all other days), Disp: 90 tablet, Rfl: 2   warfarin (COUMADIN) 7.5 MG tablet, TAKE 1 TABLET BY MOUTH EVERY EVENING (Patient taking differently: 5 mg every Wed; 7.5 mg all other days or as directed by coumadin clinic), Disp: 90 tablet, Rfl: 2 Past Medical History:  Diagnosis Date   COVID-19    Hyperlipidemia    Hypertension    Rheumatic heart disease    MS/ AS    ASSESSMENT  Recent Results: The most recent result is correlated with 45 mg per week:  Lab Results  Component Value Date   INR 3.3 (A) 03/12/2021   INR 3.1 (A) 03/06/2021   INR 3.1 (A) 02/26/2021    Anticoagulation Dosing: Description   INR at goal. Continue current weekly dose of 5 mg every Tues, Thurs, Sat and 7.5 mg all other days. Recheck INR in 1 week     INR today: Therapeutic. INR continues to remain therapeutic on current weekly dose Pt notes stable Vit K intake. Denies any unusual bleeding or bruising symptoms. Denies any other relevant changes in diet, medications, or lifestyle. Amiodarone dose stable at 200 mg. Will continue current weekly dose and continue remote monitoring.   PLAN Weekly dose was unchanged by 0 % to 45 mg/week. Continue current weekly dose of 5 mg every Tues, Thurs, Sat and 7.5 mg all other days. Recheck INR in 1 week.   Patient Instructions  INR at goal. Continue current weekly dose of 5 mg every Tues, Thurs, Sat and 7.5 mg all other days. Recheck INR in 1 week Patient advised to contact clinic or seek medical attention if signs/symptoms of bleeding or thromboembolism occur.  Patient verbalized  understanding by repeating back information and was advised to contact me if further medication-related questions arise.   Follow-up Return in about 1 week (around 03/19/2021).  [name], PharmD  15 minutes spent face-to-face with the patient during the encounter. 50% of time spent on education, including signs/sx bleeding and clotting, as well as food and drug interactions with warfarin. 50% of time was spent on fingerprick POC INR sample collection,processing, results determination, and documentation

## 2021-03-13 ENCOUNTER — Other Ambulatory Visit: Payer: Self-pay

## 2021-03-19 LAB — POCT INR: INR: 3.4 — AB (ref 2.0–3.0)

## 2021-03-20 ENCOUNTER — Ambulatory Visit: Payer: Self-pay | Admitting: Pharmacist

## 2021-03-20 DIAGNOSIS — Z5181 Encounter for therapeutic drug level monitoring: Secondary | ICD-10-CM | POA: Diagnosis not present

## 2021-03-20 DIAGNOSIS — Z952 Presence of prosthetic heart valve: Secondary | ICD-10-CM

## 2021-03-20 DIAGNOSIS — Z7901 Long term (current) use of anticoagulants: Secondary | ICD-10-CM | POA: Diagnosis not present

## 2021-03-20 NOTE — Progress Notes (Signed)
Anticoagulation Management Kimberly Hobbs is a 61 y.o. female who reports to the clinic for monitoring of warfarin treatment.    Indication: atrial fibrillation and Hx of Mechanical mitral and aortic valve replacement  ; CHA2DS2 Vasc Score 4 (Female, HTN, DM, Vascular disease hx), HAS-BLED 1 (ASA, prior bleed hx (hematuria, hematochezia)  Duration: indefinite Supervising physician: [facility] Visit History:  Patient does not report signs/symptoms of bleeding or thromboembolism   Other recent changes: No changes in diet, medications, lifestyle.   Pt maintaining steady Vit-K intake of spinach salad 3-4/week.  Stable on amiodarone 200 mg daily.   Anticoagulation Episode Summary     Current INR goal:  2.5-3.5  TTR:  58.3 % (2.9 y)  Next INR check:  03/27/2021  INR from last check:  3.4 (03/19/2021)  Weekly max warfarin dose:    Target end date:  Indefinite  INR check location:    Preferred lab:    Send INR reminders to:     Indications   Status post mechanical aortic valve replacement [Z95.2] Monitoring for long-term anticoagulant use [Z51.81 Z79.01]        Comments:           Allergies  Allergen Reactions   Iodinated Contrast Media Other (See Comments)    Patient is unsure of reaction type   Penicillins Other (See Comments)    Immune to drug , does not work per patient  Did it involve swelling of the face/tongue/throat, SOB, or low BP? No Did it involve sudden or severe rash/hives, skin peeling, or any reaction on the inside of your mouth or nose? No Did you need to seek medical attention at a hospital or doctor's office? No When did it last happen? NOT A TRUE ALLERGY   If all above answers are NO, may proceed with cephalosporin use.     Current Outpatient Medications:    acetaminophen (TYLENOL) 500 MG tablet, Take 500 mg by mouth every 6 (six) hours as needed (for pain.)., Disp: , Rfl:    albuterol (VENTOLIN HFA) 108 (90 Base)  MCG/ACT inhaler, Inhale 2 puffs into the lungs every 6 (six) hours as needed for wheezing or shortness of breath. , Disp: , Rfl:    amiodarone (PACERONE) 200 MG tablet, TAKE 1 TABLET(200 MG) BY MOUTH DAILY, Disp: 60 tablet, Rfl: 1   aspirin EC 81 MG tablet, Take 81 mg by mouth daily. , Disp: , Rfl:    citalopram (CELEXA) 40 MG tablet, Take 20 mg by mouth 2 (two) times daily., Disp: , Rfl:    clonazePAM (KLONOPIN) 0.5 MG tablet, Take 1.5 tablets (0.75 mg total) by mouth at bedtime., Disp: 45 tablet, Rfl: 0   diltiazem (CARDIZEM CD) 180 MG 24 hr capsule, TAKE ONE CAPSULE BY MOUTH DAILY, Disp: 90 capsule, Rfl: 2   furosemide (LASIX) 20 MG tablet, TAKE 1 TABLET BY MOUTH DAILY, Disp: 90 tablet, Rfl: 2   isosorbide mononitrate (IMDUR) 60 MG 24 hr tablet, Take 1 tablet (60 mg total) by mouth daily., Disp: 90 tablet, Rfl: 3   Lancets (ONETOUCH DELICA PLUS [id number]) MISC, TAKE AS DIRECTED ONCE DAILY., Disp: , Rfl:    levalbuterol (XOPENEX HFA) 45 MCG/ACT inhaler, Inhale 1-2 puffs into the lungs every 6 (six) hours as needed for shortness of breath., Disp: 1 Inhaler, Rfl: 12   losartan (COZAAR) 50 MG tablet, TAKE 1 TABLET BY MOUTH DAILY, Disp: 30 tablet, Rfl: 6   metFORMIN (GLUCOPHAGE) 500 MG tablet, Take 500 mg  by mouth 2 (two) times daily., Disp: , Rfl:    metoprolol tartrate (LOPRESSOR) 50 MG tablet, Take 1 tablet (50 mg total) by mouth 2 (two) times daily., Disp: 60 tablet, Rfl: 3   nitroGLYCERIN (NITROSTAT) 0.4 MG SL tablet, Place 1 tablet (0.4 mg total) under the tongue every 5 (five) minutes x 3 doses as needed for chest pain., Disp: 25 tablet, Rfl: 3   ONETOUCH VERIO test strip, AS DIRECTED ONCE A DAY, Disp: , Rfl:    pantoprazole (PROTONIX) 40 MG tablet, TAKE 1 TABLET(40 MG) BY MOUTH DAILY, Disp: 90 tablet, Rfl: 1   rosuvastatin (CRESTOR) 40 MG tablet, Take 1 tablet (40 mg total) by mouth daily., Disp: 90 tablet, Rfl: 1   spironolactone (ALDACTONE) 50 MG tablet, TAKE 1 TABLET(50 MG) BY MOUTH DAILY,  Disp: 30 tablet, Rfl: 6   STIOLTO RESPIMAT 2.5-2.5 MCG/ACT AERS, INHALE 2 PUFFS INTO THE LUNGS DAILY, Disp: 4 g, Rfl: 5   warfarin (COUMADIN) 5 MG tablet, Take 1 tablet (5 mg total) by mouth daily. Refer to most recent anticoagulation note for most accurate information. (Patient taking differently: Take 5 mg by mouth daily. or as directed by coumadin clinic  Currently on: 5 mg every Wed; 7.5 mg all other days), Disp: 90 tablet, Rfl: 2   warfarin (COUMADIN) 7.5 MG tablet, TAKE 1 TABLET BY MOUTH EVERY EVENING (Patient taking differently: 5 mg every Wed; 7.5 mg all other days or as directed by coumadin clinic), Disp: 90 tablet, Rfl: 2 Past Medical History:  Diagnosis Date   COVID-19    Hyperlipidemia    Hypertension    Rheumatic heart disease    MS/ AS    ASSESSMENT  Recent Results: The most recent result is correlated with 45 mg per week:  Lab Results  Component Value Date   INR 3.4 (A) 03/19/2021   INR 3.3 (A) 03/12/2021   INR 3.1 (A) 03/06/2021    Anticoagulation Dosing: Description   INR at goal. Continue current weekly dose of 5 mg every Tues, Thurs, Sat and 7.5 mg all other days. Recheck INR in 1 week     INR today: Therapeutic. INR continues to remain therapeutic on current weekly dose Pt notes stable Vit K intake. Denies any unusual bleeding or bruising symptoms. Denies any other relevant changes in diet, medications, or lifestyle. Amiodarone dose stable at 200 mg. Will continue current weekly dose and continue remote monitoring.   PLAN Weekly dose was unchanged by 0 % to 45 mg/week. Continue current weekly dose of 5 mg every Tues, Thurs, Sat and 7.5 mg all other days. Recheck INR in 1 week.   Patient Instructions  INR at goal. Continue current weekly dose of 5 mg every Tues, Thurs, Sat and 7.5 mg all other days. Recheck INR in 1 week Patient advised to contact clinic or seek medical attention if signs/symptoms of bleeding or thromboembolism occur.  Patient verbalized  understanding by repeating back information and was advised to contact me if further medication-related questions arise.   Follow-up Return in about 1 week (around 03/27/2021).  [name], PharmD  15 minutes spent face-to-face with the patient during the encounter. 50% of time spent on education, including signs/sx bleeding and clotting, as well as food and drug interactions with warfarin. 50% of time was spent on fingerprick POC INR sample collection,processing, results determination, and documentation

## 2021-03-20 NOTE — Patient Instructions (Signed)
INR at goal. Continue current weekly dose of 5 mg every Tues, Thurs, Sat and 7.5 mg all other days. Recheck INR in 1 week

## 2021-03-26 ENCOUNTER — Ambulatory Visit: Payer: Self-pay | Admitting: Pharmacist

## 2021-03-26 DIAGNOSIS — Z952 Presence of prosthetic heart valve: Secondary | ICD-10-CM | POA: Diagnosis not present

## 2021-03-26 DIAGNOSIS — Z5181 Encounter for therapeutic drug level monitoring: Secondary | ICD-10-CM | POA: Diagnosis not present

## 2021-03-26 DIAGNOSIS — Z7901 Long term (current) use of anticoagulants: Secondary | ICD-10-CM | POA: Diagnosis not present

## 2021-03-26 DIAGNOSIS — I480 Paroxysmal atrial fibrillation: Secondary | ICD-10-CM | POA: Diagnosis not present

## 2021-03-26 LAB — POCT INR: INR: 3.3 — AB (ref 2.0–3.0)

## 2021-03-26 NOTE — Progress Notes (Signed)
Anticoagulation Management Kimberly Hobbs is a 61 y.o. female who reports to the clinic for monitoring of warfarin treatment.    Indication: atrial fibrillation and Hx of Mechanical mitral and aortic valve replacement  ; CHA2DS2 Vasc Score 4 (Female, HTN, DM, Vascular disease hx), HAS-BLED 1 (ASA, prior bleed hx (hematuria, hematochezia)  Duration: indefinite Supervising physician: [facility] Visit History:  Patient does not report signs/symptoms of bleeding or thromboembolism   Other recent changes: No changes in diet, medications, lifestyle.   Pt maintaining steady Vit-K intake of spinach salad 3-4/week.  Stable on amiodarone 200 mg daily.   Pt reports that she noticed mild subconjunctival hemorrhage last night. Pt reports bleeding symptoms minor and reports improvement in the severity of the bleeding concerns.   Anticoagulation Episode Summary     Current INR goal:  2.5-3.5  TTR:  58.5 % (3 y)  Next INR check:  04/02/2021  INR from last check:  3.3 (03/26/2021)  Weekly max warfarin dose:    Target end date:  Indefinite  INR check location:    Preferred lab:    Send INR reminders to:     Indications   Status post mechanical aortic valve replacement [Z95.2] Monitoring for long-term anticoagulant use [Z51.81 Z79.01]        Comments:           Allergies  Allergen Reactions   Iodinated Contrast Media Other (See Comments)    Patient is unsure of reaction type   Penicillins Other (See Comments)    Immune to drug , does not work per patient  Did it involve swelling of the face/tongue/throat, SOB, or low BP? No Did it involve sudden or severe rash/hives, skin peeling, or any reaction on the inside of your mouth or nose? No Did you need to seek medical attention at a hospital or doctor's office? No When did it last happen? NOT A TRUE ALLERGY   If all above answers are NO, may proceed with cephalosporin use.     Current Outpatient  Medications:    acetaminophen (TYLENOL) 500 MG tablet, Take 500 mg by mouth every 6 (six) hours as needed (for pain.)., Disp: , Rfl:    albuterol (VENTOLIN HFA) 108 (90 Base) MCG/ACT inhaler, Inhale 2 puffs into the lungs every 6 (six) hours as needed for wheezing or shortness of breath. , Disp: , Rfl:    amiodarone (PACERONE) 200 MG tablet, TAKE 1 TABLET(200 MG) BY MOUTH DAILY, Disp: 60 tablet, Rfl: 1   aspirin EC 81 MG tablet, Take 81 mg by mouth daily. , Disp: , Rfl:    citalopram (CELEXA) 40 MG tablet, Take 20 mg by mouth 2 (two) times daily., Disp: , Rfl:    clonazePAM (KLONOPIN) 0.5 MG tablet, Take 1.5 tablets (0.75 mg total) by mouth at bedtime., Disp: 45 tablet, Rfl: 0   diltiazem (CARDIZEM CD) 180 MG 24 hr capsule, TAKE ONE CAPSULE BY MOUTH DAILY, Disp: 90 capsule, Rfl: 2   furosemide (LASIX) 20 MG tablet, TAKE 1 TABLET BY MOUTH DAILY, Disp: 90 tablet, Rfl: 2   isosorbide mononitrate (IMDUR) 60 MG 24 hr tablet, Take 1 tablet (60 mg total) by mouth daily., Disp: 90 tablet, Rfl: 3   Lancets (ONETOUCH DELICA PLUS [id number]) MISC, TAKE AS DIRECTED ONCE DAILY., Disp: , Rfl:    levalbuterol (XOPENEX HFA) 45 MCG/ACT inhaler, Inhale 1-2 puffs into the lungs every 6 (six) hours as needed for shortness of breath., Disp: 1 Inhaler, Rfl: 12  losartan (COZAAR) 50 MG tablet, TAKE 1 TABLET BY MOUTH DAILY, Disp: 30 tablet, Rfl: 6   metFORMIN (GLUCOPHAGE) 500 MG tablet, Take 500 mg by mouth 2 (two) times daily., Disp: , Rfl:    metoprolol tartrate (LOPRESSOR) 50 MG tablet, Take 1 tablet (50 mg total) by mouth 2 (two) times daily., Disp: 60 tablet, Rfl: 3   nitroGLYCERIN (NITROSTAT) 0.4 MG SL tablet, Place 1 tablet (0.4 mg total) under the tongue every 5 (five) minutes x 3 doses as needed for chest pain., Disp: 25 tablet, Rfl: 3   ONETOUCH VERIO test strip, AS DIRECTED ONCE A DAY, Disp: , Rfl:    pantoprazole (PROTONIX) 40 MG tablet, TAKE 1 TABLET(40 MG) BY MOUTH DAILY, Disp: 90 tablet, Rfl: 1    rosuvastatin (CRESTOR) 40 MG tablet, Take 1 tablet (40 mg total) by mouth daily., Disp: 90 tablet, Rfl: 1   spironolactone (ALDACTONE) 50 MG tablet, TAKE 1 TABLET(50 MG) BY MOUTH DAILY, Disp: 30 tablet, Rfl: 6   STIOLTO RESPIMAT 2.5-2.5 MCG/ACT AERS, INHALE 2 PUFFS INTO THE LUNGS DAILY, Disp: 4 g, Rfl: 5   warfarin (COUMADIN) 5 MG tablet, Take 1 tablet (5 mg total) by mouth daily. Refer to most recent anticoagulation note for most accurate information. (Patient taking differently: Take 5 mg by mouth daily. or as directed by coumadin clinic  Currently on: 5 mg every Wed; 7.5 mg all other days), Disp: 90 tablet, Rfl: 2   warfarin (COUMADIN) 7.5 MG tablet, TAKE 1 TABLET BY MOUTH EVERY EVENING (Patient taking differently: 5 mg every Wed; 7.5 mg all other days or as directed by coumadin clinic), Disp: 90 tablet, Rfl: 2 Past Medical History:  Diagnosis Date   COVID-19    Hyperlipidemia    Hypertension    Rheumatic heart disease    MS/ AS    ASSESSMENT  Recent Results: The most recent result is correlated with 45 mg per week:  Lab Results  Component Value Date   INR 3.3 (A) 03/26/2021   INR 3.4 (A) 03/19/2021   INR 3.3 (A) 03/12/2021    Anticoagulation Dosing: Description   INR at goal. Continue current weekly dose of 5 mg every Tues, Thurs, Sat and 7.5 mg all other days. Recheck INR in 1 week     INR today: Therapeutic. INR continues to remain therapeutic on current weekly dose Pt notes stable Vit K intake. Denies any unusual bleeding or bruising symptoms. Denies any other relevant changes in diet, medications, or lifestyle. Amiodarone dose stable at 200 mg. Will continue current weekly dose and continue remote monitoring.   PLAN Weekly dose was unchanged by 0 % to 45 mg/week. Continue current weekly dose of 5 mg every Tues, Thurs, Sat and 7.5 mg all other days. Recheck INR in 1 week.   Patient Instructions  INR at goal. Continue current weekly dose of 5 mg every Tues, Thurs, Sat and  7.5 mg all other days. Recheck INR in 1 week Patient advised to contact clinic or seek medical attention if signs/symptoms of bleeding or thromboembolism occur.  Patient verbalized understanding by repeating back information and was advised to contact me if further medication-related questions arise.   Follow-up Return in about 1 week (around 04/02/2021).  [name], PharmD  15 minutes spent face-to-face with the patient during the encounter. 50% of time spent on education, including signs/sx bleeding and clotting, as well as food and drug interactions with warfarin. 50% of time was spent on fingerprick POC INR sample collection,processing,  results determination, and documentation

## 2021-03-26 NOTE — Patient Instructions (Signed)
INR at goal. Continue current weekly dose of 5 mg every Tues, Thurs, Sat and 7.5 mg all other days. Recheck INR in 1 week

## 2021-03-28 ENCOUNTER — Other Ambulatory Visit: Payer: Self-pay

## 2021-03-28 ENCOUNTER — Encounter: Payer: Self-pay | Admitting: Cardiology

## 2021-03-28 ENCOUNTER — Ambulatory Visit: Payer: Medicare Other | Admitting: Cardiology

## 2021-03-28 VITALS — BP 131/56 | HR 52 | Temp 98.0°F | Resp 16 | Ht 64.0 in | Wt 219.0 lb

## 2021-03-28 DIAGNOSIS — I480 Paroxysmal atrial fibrillation: Secondary | ICD-10-CM | POA: Insufficient documentation

## 2021-03-28 DIAGNOSIS — Z952 Presence of prosthetic heart valve: Secondary | ICD-10-CM | POA: Diagnosis not present

## 2021-03-28 DIAGNOSIS — I25708 Atherosclerosis of coronary artery bypass graft(s), unspecified, with other forms of angina pectoris: Secondary | ICD-10-CM | POA: Diagnosis not present

## 2021-03-28 DIAGNOSIS — I10 Essential (primary) hypertension: Secondary | ICD-10-CM | POA: Diagnosis not present

## 2021-03-28 MED ORDER — AMIODARONE HCL 100 MG PO TABS: 200.0000 mg | ORAL_TABLET | Freq: Every day | ORAL | 3 refills | Status: DC

## 2021-03-28 NOTE — Progress Notes (Signed)
Subjective:   Kimberly Hobbs, female    DOB: 04/21/1960, 61 y.o.   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  Patient does not have any cardiac complaints today. She reports lack of motivation to do any activity.  She is going to see he PCP and psychiatrist soon.   Current Outpatient Medications on File Prior to Visit  Medication Sig Dispense Refill   acetaminophen (TYLENOL) 500 MG tablet Take 500 mg by mouth every 6 (six) hours as needed (for pain.).     albuterol (VENTOLIN HFA) 108 (90 Base) MCG/ACT inhaler Inhale 2 puffs into the lungs every 6 (six) hours as needed for wheezing or shortness of breath.      amiodarone (PACERONE) 200 MG tablet TAKE 1 TABLET(200 MG) BY MOUTH DAILY 60 tablet 1   aspirin EC 81 MG tablet Take 81 mg by mouth daily.      citalopram (CELEXA) 40 MG tablet Take 20 mg by mouth 2 (two) times daily.     clonazePAM (KLONOPIN) 0.5 MG tablet Take 1.5 tablets (0.75 mg total) by mouth at bedtime. 45 tablet 0   diltiazem (CARDIZEM CD) 180 MG 24 hr capsule TAKE ONE CAPSULE BY MOUTH DAILY 90 capsule 2   furosemide (LASIX) 20 MG tablet TAKE 1 TABLET BY MOUTH DAILY 90 tablet 2   isosorbide mononitrate (IMDUR) 60 MG 24 hr tablet Take 1 tablet (60 mg total) by mouth daily. 90 tablet 3   Lancets (ONETOUCH DELICA PLUS [id number]) MISC TAKE AS DIRECTED ONCE DAILY.     levalbuterol (XOPENEX HFA) 45 MCG/ACT inhaler Inhale 1-2 puffs into the lungs every 6 (six) hours as needed for shortness of breath. 1 Inhaler 12   losartan (COZAAR) 50 MG tablet TAKE 1 TABLET BY MOUTH DAILY 30 tablet 6   metFORMIN (GLUCOPHAGE) 500 MG tablet Take 500 mg by mouth 2 (two) times daily.     metoprolol tartrate (LOPRESSOR) 50 MG tablet  Take 1 tablet (50 mg total) by mouth 2 (two) times daily. 60 tablet 3   nitroGLYCERIN (NITROSTAT) 0.4 MG SL tablet Place 1 tablet (0.4 mg total) under the tongue every 5 (five) minutes x 3 doses as needed for chest pain. 25 tablet 3   ONETOUCH VERIO test strip AS DIRECTED ONCE A DAY     pantoprazole (PROTONIX) 40 MG tablet TAKE 1 TABLET(40 MG) BY MOUTH DAILY 90 tablet 1   rosuvastatin (CRESTOR) 40 MG tablet Take 1 tablet (40 mg total) by mouth daily. 90 tablet 1   spironolactone (ALDACTONE) 50 MG tablet TAKE 1 TABLET(50 MG) BY MOUTH DAILY 30 tablet 6   STIOLTO RESPIMAT 2.5-2.5 MCG/ACT AERS INHALE 2 PUFFS INTO THE LUNGS DAILY 4 g 5   warfarin (COUMADIN) 5 MG tablet Take 1 tablet (5 mg total) by mouth daily. Refer to most recent anticoagulation note for most accurate information. (Patient taking differently: Take 5 mg by mouth daily. or as directed by coumadin clinic  Currently on: 5 mg every Wed; 7.5 mg all other days) 90 tablet 2   warfarin (COUMADIN) 7.5 MG tablet TAKE 1 TABLET BY MOUTH EVERY EVENING (Patient taking differently: 5 mg every Wed; 7.5 mg all other days or as directed by coumadin clinic) 90 tablet 2   No current facility-administered  medications on file prior to visit.    Cardiovascular studies:  EKG 01/22/2021: Probable sinus bradycardia 46 bpm LAFB  Coronary and bypass graft angiography 07/21/2018 and 08/18/2018: LM: Normal LAD: 100% mid occlusion. Mild distal disease LIMA-LAD: Patent LCx: Normal RCA: Prox 60% stenosis, mid 30-40% stenoses. TIMI III flow in prox-mid RCA. 100% distal occlusion SVG-RCA: Patent. Ostial 40%. Distal RCA moderate diffuse disease.    Guide catheter angiography provided superior images. There is TIMI III flow in SVG which fills RCA all the way to the ostium. Even if she were to have FFR positive lesion in the graft, I do not think the benefits of a stent would outweigh the risk of losing the graft and the entire RCA territory circulation in a  borderline lesion. Also, the prox RCA lesion is well bypassed by the SVG graft. Thus, I decided not to perform FFR/PCI to either of these lesions. Continue medical management.   [facility] 07/21/2018: #1: RA: 18 mmHg RV 90/12 mmHg PA: 94/40 mmHg. Mean PA 63 mmHg. PW 33 mmHg  Lasix 80 mg  #2: RA 19 mmHg RV 60/0 mmHg PA: 56/16 mmHg. Mean PA 36 mmHg PW: 25 mmHg  CO: 5.4 L/min. CI 2.6 L/min/m2 PVR 2 WU  Impression: Elevated filling pressures Moderate WHO Grp II pulmonary hypertension Improvement in filling pressures with diuresis.   Echocardiogram 02/26/2018: Left ventricle cavity is normal in size. Mild concentric hypertrophy of the left ventricle. Abnormal septal wall motion due to post-operative valve. Diastolic function could not be assessed due to post op valve and CABG status.  Calculated EF 63%. Left atrial cavity is moderate to severely dilated measures 4.5 cm in long axis. Right atrial cavity is mildly dilated. Mechanical aortic valve with trace regurgitation. Mild aortic valve leaflet calcification. Mildly restricted aortic valve leaflets. Mild to moderate aortic valve stenosis. Aortic valve peak pressure gradient of  43 and mean gradient of 21 mmHg, calculated aortic valve area    0.88 cm. Mechanical mitral valve with trace regurgitation. Moderately restricted mitral valve leaflets. Mild mitral valve stenosis. Mitral valve peak pressure gradient of  26  and mean gradient of  6.2  mmHg, calculated mitral valve area 1.9   cm. Mild to moderate tricuspid regurgitation. Mild pulmonary hypertension. PA systolic pressure estimated at 39 mm Hg. Compared to the study done on 08/06/2017, no significant change.  TEE 07/21/2018:  1. The left ventricle has normal systolic function, with an ejection fraction of 55-60%. There is abnormal septal motion consistent with post-operative status.  2. The right ventricle has normal systolc function.  3. A 25 mm [employer] mechanical valve is present in the  mitral position. Procedure Date: 2018 Echo findings are consistent with is functioning normally the mitral prosthesis. Normal mitral valve prosthesis. Mean PG 6 mmHg, MVA 1.7 cm2. No thrombus seen.  (Patient known to have subtherapeutic INR).  4. A 108m [employer] mechanical prosthesis valve is present in the aortic position. Procedure Date: 2018 Normal aortic valve prosthesis. Echo findings shows no evidence of Accelration time of 88 msec suggests normal functioning valve. Mean PG 25 mmHg  likely due to small annular diameter. No prosthetic valve stenosis noted. of the aortic prosthesis. No thrombus seen. (Patient known to have subtherapeutic INR).  Recent labs: April-May 2022: Glucose 129, BUN/Cr 25/1.09. eGFR 59. Na/K 136/4.7. Rest of the CMP normal H/H 11.9/36.8. MCV 92. Platelets 199 TSH 2.8 normal  02/16/2018: BNP 251 elevated  Labs 07/02/2017: A1c 5.7%, HB 12.9/HCT 38.6, platelets 220, normal indicis.  Serum glucose 90 mg, BUN 23, creatinine 0.75, CMP normal.  Potassium 4.6.  TSH normal.  Review of Systems  Cardiovascular:  Negative for chest pain, dyspnea on exertion, leg swelling, palpitations and syncope.  Musculoskeletal:  Positive for joint pain.       Right forearm/elbow pain  Neurological:  Positive for dizziness.        There were no vitals filed for this visit.   Objective:     Physical Exam Vitals and nursing note reviewed.  Constitutional:      General: She is not in acute distress. Neck:     Vascular: No JVD.  Cardiovascular:     Rate and Rhythm: Bradycardia present. Rhythm irregular.     Heart sounds: Normal heart sounds. No murmur heard.    Comments: Metallic S1, S2 Pulmonary:     Effort: Pulmonary effort is normal.     Breath sounds: Normal breath sounds. No wheezing or rales.         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  Paroxysmal Afib: Currently in probable sinus bradycardia. Stop metoprolol tartrate, reduce amiodarone to 100 mg daily. Continue diltiazem 180 mg for now.  Hopefully topping metoprolol could reduce a confounding factor for depression as well. High CHA2DS2VAsc score. Continue warfarin, as below.  Continue monitoring in our warfarin clinic.  CAD of native and bypass grafts without angina: Patent grafts with non-critical disease.(07/2018) Continue medical management with Aspirin, Imdur 60 mg daiy, losartan 50 mg daily, diltiazem 180 mg daily, crestor 40 mg daily.  S/p MVR, AVR: Continue warfarin  Hypertension: Controlled.  F/u in 3 months  [place], MD [facility] Pager: [phone] Office: [phone] If no answer Cell [phone]

## 2021-04-02 ENCOUNTER — Ambulatory Visit: Payer: 59 | Admitting: Nurse Practitioner

## 2021-04-02 ENCOUNTER — Ambulatory Visit: Payer: Medicaid Other | Admitting: Psychiatry

## 2021-04-03 ENCOUNTER — Ambulatory Visit: Payer: Self-pay | Admitting: Pharmacist

## 2021-04-03 DIAGNOSIS — Z5181 Encounter for therapeutic drug level monitoring: Secondary | ICD-10-CM

## 2021-04-03 DIAGNOSIS — Z7901 Long term (current) use of anticoagulants: Secondary | ICD-10-CM

## 2021-04-03 DIAGNOSIS — Z952 Presence of prosthetic heart valve: Secondary | ICD-10-CM

## 2021-04-03 LAB — POCT INR: INR: 3 (ref 2.0–3.0)

## 2021-04-03 NOTE — Progress Notes (Signed)
Anticoagulation Management Kimberly Hobbs is a 61 y.o. female who reports to the clinic for monitoring of warfarin treatment.    Indication: atrial fibrillation and Hx of Mechanical mitral and aortic valve replacement  ; CHA2DS2 Vasc Score 4 (Female, HTN, DM, Vascular disease hx), HAS-BLED 1 (ASA, prior bleed hx (hematuria, hematochezia)  Duration: indefinite Supervising physician: [facility] Visit History:  Patient does not report signs/symptoms of bleeding or thromboembolism   Other recent changes: No changes in diet, medications, lifestyle.   Pt maintaining steady Vit-K intake of spinach salad 3-4/week.  Stable on amiodarone 200 mg daily.   Anticoagulation Episode Summary     Current INR goal:  2.5-3.5  TTR:  58.8 % (3 y)  Next INR check:  04/10/2021  INR from last check:  3.0 (04/03/2021)  Weekly max warfarin dose:    Target end date:  Indefinite  INR check location:    Preferred lab:    Send INR reminders to:     Indications   Status post mechanical aortic valve replacement [Z95.2] Monitoring for long-term anticoagulant use [Z51.81 Z79.01]        Comments:           Allergies  Allergen Reactions   Iodinated Contrast Media Other (See Comments)    Patient is unsure of reaction type   Penicillins Other (See Comments)    Immune to drug , does not work per patient  Did it involve swelling of the face/tongue/throat, SOB, or low BP? No Did it involve sudden or severe rash/hives, skin peeling, or any reaction on the inside of your mouth or nose? No Did you need to seek medical attention at a hospital or doctor's office? No When did it last happen? NOT A TRUE ALLERGY   If all above answers are NO, may proceed with cephalosporin use.     Current Outpatient Medications:    acetaminophen (TYLENOL) 500 MG tablet, Take 500 mg by mouth every 6 (six) hours as needed (for pain.)., Disp: , Rfl:    albuterol (VENTOLIN HFA) 108 (90 Base)  MCG/ACT inhaler, Inhale 2 puffs into the lungs every 6 (six) hours as needed for wheezing or shortness of breath. , Disp: , Rfl:    amiodarone (PACERONE) 100 MG tablet, Take 100 mg by mouth daily., Disp: , Rfl:    aspirin EC 81 MG tablet, Take 81 mg by mouth daily. , Disp: , Rfl:    citalopram (CELEXA) 40 MG tablet, Take 20 mg by mouth 2 (two) times daily., Disp: , Rfl:    clonazePAM (KLONOPIN) 0.5 MG tablet, Take 1.5 tablets (0.75 mg total) by mouth at bedtime., Disp: 45 tablet, Rfl: 0   diltiazem (CARDIZEM CD) 180 MG 24 hr capsule, TAKE ONE CAPSULE BY MOUTH DAILY, Disp: 90 capsule, Rfl: 2   furosemide (LASIX) 20 MG tablet, TAKE 1 TABLET BY MOUTH DAILY, Disp: 90 tablet, Rfl: 2   isosorbide mononitrate (IMDUR) 60 MG 24 hr tablet, Take 1 tablet (60 mg total) by mouth daily., Disp: 90 tablet, Rfl: 3   Lancets (ONETOUCH DELICA PLUS [id number]) MISC, TAKE AS DIRECTED ONCE DAILY., Disp: , Rfl:    levalbuterol (XOPENEX HFA) 45 MCG/ACT inhaler, Inhale 1-2 puffs into the lungs every 6 (six) hours as needed for shortness of breath., Disp: 1 Inhaler, Rfl: 12   losartan (COZAAR) 50 MG tablet, TAKE 1 TABLET BY MOUTH DAILY, Disp: 30 tablet, Rfl: 6   metFORMIN (GLUCOPHAGE) 500 MG tablet, Take 500 mg by mouth  2 (two) times daily., Disp: , Rfl:    nitroGLYCERIN (NITROSTAT) 0.4 MG SL tablet, Place 1 tablet (0.4 mg total) under the tongue every 5 (five) minutes x 3 doses as needed for chest pain., Disp: 25 tablet, Rfl: 3   ONETOUCH VERIO test strip, AS DIRECTED ONCE A DAY, Disp: , Rfl:    pantoprazole (PROTONIX) 40 MG tablet, TAKE 1 TABLET(40 MG) BY MOUTH DAILY, Disp: 90 tablet, Rfl: 1   rosuvastatin (CRESTOR) 40 MG tablet, Take 1 tablet (40 mg total) by mouth daily., Disp: 90 tablet, Rfl: 1   spironolactone (ALDACTONE) 50 MG tablet, TAKE 1 TABLET(50 MG) BY MOUTH DAILY, Disp: 30 tablet, Rfl: 6   STIOLTO RESPIMAT 2.5-2.5 MCG/ACT AERS, INHALE 2 PUFFS INTO THE LUNGS DAILY, Disp: 4 g, Rfl: 5   warfarin (COUMADIN) 5 MG  tablet, Take 1 tablet (5 mg total) by mouth daily. Refer to most recent anticoagulation note for most accurate information. (Patient taking differently: Take 5 mg by mouth daily. or as directed by coumadin clinic  Currently on: 5 mg every Wed; 7.5 mg all other days), Disp: 90 tablet, Rfl: 2   warfarin (COUMADIN) 7.5 MG tablet, TAKE 1 TABLET BY MOUTH EVERY EVENING (Patient taking differently: 5 mg every Wed; 7.5 mg all other days or as directed by coumadin clinic), Disp: 90 tablet, Rfl: 2 Past Medical History:  Diagnosis Date   COVID-19    Hyperlipidemia    Hypertension    Rheumatic heart disease    MS/ AS    ASSESSMENT  Recent Results: The most recent result is correlated with 45 mg per week:  Lab Results  Component Value Date   INR 3.0 04/03/2021   INR 3.3 (A) 03/26/2021   INR 3.4 (A) 03/19/2021    Anticoagulation Dosing: Description   INR at goal. Continue current weekly dose of 5 mg every Tues, Thurs, Sat and 7.5 mg all other days. Recheck INR in 1 week     INR today: Therapeutic. INR continues to remain therapeutic on current weekly dose Pt notes stable Vit K intake. Denies any unusual bleeding or bruising symptoms. Denies any other relevant changes in diet, medications, or lifestyle. Amiodarone dose stable at 200 mg. Will continue current weekly dose and continue remote monitoring.   PLAN Weekly dose was unchanged by 0 % to 45 mg/week. Continue current weekly dose of 5 mg every Tues, Thurs, Sat and 7.5 mg all other days. Recheck INR in 1 week.   Patient Instructions  INR at goal. Continue current weekly dose of 5 mg every Tues, Thurs, Sat and 7.5 mg all other days. Recheck INR in 1 week Patient advised to contact clinic or seek medical attention if signs/symptoms of bleeding or thromboembolism occur.  Patient verbalized understanding by repeating back information and was advised to contact me if further medication-related questions arise.   Follow-up Return in about 1  week (around 04/10/2021).  [name], PharmD  15 minutes spent face-to-face with the patient during the encounter. 50% of time spent on education, including signs/sx bleeding and clotting, as well as food and drug interactions with warfarin. 50% of time was spent on fingerprick POC INR sample collection,processing, results determination, and documentation

## 2021-04-03 NOTE — Patient Instructions (Signed)
INR at goal. Continue current weekly dose of 5 mg every Tues, Thurs, Sat and 7.5 mg all other days. Recheck INR in 1 week

## 2021-04-09 ENCOUNTER — Ambulatory Visit: Payer: Self-pay | Admitting: Pharmacist

## 2021-04-09 ENCOUNTER — Encounter: Payer: Self-pay | Admitting: Pharmacist

## 2021-04-09 DIAGNOSIS — Z952 Presence of prosthetic heart valve: Secondary | ICD-10-CM | POA: Diagnosis not present

## 2021-04-09 DIAGNOSIS — Z7901 Long term (current) use of anticoagulants: Secondary | ICD-10-CM | POA: Diagnosis not present

## 2021-04-09 DIAGNOSIS — Z5181 Encounter for therapeutic drug level monitoring: Secondary | ICD-10-CM | POA: Diagnosis not present

## 2021-04-09 LAB — POCT INR: INR: 2.8 (ref 2.0–3.0)

## 2021-04-09 NOTE — Patient Instructions (Signed)
INR at goal. Continue current weekly dose of 5 mg every Tues, Thurs, Sat and 7.5 mg all other days. Recheck INR in 1 week

## 2021-04-09 NOTE — Progress Notes (Signed)
Anticoagulation Management Kimberly Hobbs is a 61 y.o. female who reports to the clinic for monitoring of warfarin treatment.    Indication: atrial fibrillation and Hx of Mechanical mitral and aortic valve replacement  ; CHA2DS2 Vasc Score 4 (Female, HTN, DM, Vascular disease hx), HAS-BLED 1 (ASA, prior bleed hx (hematuria, hematochezia)  Duration: indefinite Supervising physician: [facility] Visit History:  Patient does not report signs/symptoms of bleeding or thromboembolism   Other recent changes: No changes in diet, medications, lifestyle.   Pt maintaining steady Vit-K intake of spinach salad 3-4/week.  Stable on amiodarone 200 mg daily.   Anticoagulation Episode Summary     Current INR goal:  2.5-3.5  TTR:  59.4 % (3 y)  Next INR check:  04/16/2021  INR from last check:  2.8 (04/09/2021)  Weekly max warfarin dose:    Target end date:  Indefinite  INR check location:    Preferred lab:    Send INR reminders to:     Indications   Status post mechanical aortic valve replacement [Z95.2] Monitoring for long-term anticoagulant use [Z51.81 Z79.01]        Comments:           Allergies  Allergen Reactions   Iodinated Contrast Media Other (See Comments)    Patient is unsure of reaction type   Penicillins Other (See Comments)    Immune to drug , does not work per patient  Did it involve swelling of the face/tongue/throat, SOB, or low BP? No Did it involve sudden or severe rash/hives, skin peeling, or any reaction on the inside of your mouth or nose? No Did you need to seek medical attention at a hospital or doctor's office? No When did it last happen? NOT A TRUE ALLERGY   If all above answers are NO, may proceed with cephalosporin use.     Current Outpatient Medications:    acetaminophen (TYLENOL) 500 MG tablet, Take 500 mg by mouth every 6 (six) hours as needed (for pain.)., Disp: , Rfl:    albuterol (VENTOLIN HFA) 108 (90 Base)  MCG/ACT inhaler, Inhale 2 puffs into the lungs every 6 (six) hours as needed for wheezing or shortness of breath. , Disp: , Rfl:    amiodarone (PACERONE) 100 MG tablet, Take 100 mg by mouth daily., Disp: , Rfl:    aspirin EC 81 MG tablet, Take 81 mg by mouth daily. , Disp: , Rfl:    citalopram (CELEXA) 40 MG tablet, Take 20 mg by mouth 2 (two) times daily., Disp: , Rfl:    clonazePAM (KLONOPIN) 0.5 MG tablet, Take 1.5 tablets (0.75 mg total) by mouth at bedtime., Disp: 45 tablet, Rfl: 0   diltiazem (CARDIZEM CD) 180 MG 24 hr capsule, TAKE ONE CAPSULE BY MOUTH DAILY, Disp: 90 capsule, Rfl: 2   furosemide (LASIX) 20 MG tablet, TAKE 1 TABLET BY MOUTH DAILY, Disp: 90 tablet, Rfl: 2   isosorbide mononitrate (IMDUR) 60 MG 24 hr tablet, Take 1 tablet (60 mg total) by mouth daily., Disp: 90 tablet, Rfl: 3   Lancets (ONETOUCH DELICA PLUS [id number]) MISC, TAKE AS DIRECTED ONCE DAILY., Disp: , Rfl:    levalbuterol (XOPENEX HFA) 45 MCG/ACT inhaler, Inhale 1-2 puffs into the lungs every 6 (six) hours as needed for shortness of breath., Disp: 1 Inhaler, Rfl: 12   losartan (COZAAR) 50 MG tablet, TAKE 1 TABLET BY MOUTH DAILY, Disp: 30 tablet, Rfl: 6   metFORMIN (GLUCOPHAGE) 500 MG tablet, Take 500 mg by mouth  2 (two) times daily., Disp: , Rfl:    nitroGLYCERIN (NITROSTAT) 0.4 MG SL tablet, Place 1 tablet (0.4 mg total) under the tongue every 5 (five) minutes x 3 doses as needed for chest pain., Disp: 25 tablet, Rfl: 3   ONETOUCH VERIO test strip, AS DIRECTED ONCE A DAY, Disp: , Rfl:    pantoprazole (PROTONIX) 40 MG tablet, TAKE 1 TABLET(40 MG) BY MOUTH DAILY, Disp: 90 tablet, Rfl: 1   rosuvastatin (CRESTOR) 40 MG tablet, Take 1 tablet (40 mg total) by mouth daily., Disp: 90 tablet, Rfl: 1   spironolactone (ALDACTONE) 50 MG tablet, TAKE 1 TABLET(50 MG) BY MOUTH DAILY, Disp: 30 tablet, Rfl: 6   STIOLTO RESPIMAT 2.5-2.5 MCG/ACT AERS, INHALE 2 PUFFS INTO THE LUNGS DAILY, Disp: 4 g, Rfl: 5   warfarin (COUMADIN) 5 MG  tablet, Take 1 tablet (5 mg total) by mouth daily. Refer to most recent anticoagulation note for most accurate information. (Patient taking differently: Take 5 mg by mouth daily. or as directed by coumadin clinic  Currently on: 5 mg every Wed; 7.5 mg all other days), Disp: 90 tablet, Rfl: 2   warfarin (COUMADIN) 7.5 MG tablet, TAKE 1 TABLET BY MOUTH EVERY EVENING (Patient taking differently: 5 mg every Wed; 7.5 mg all other days or as directed by coumadin clinic), Disp: 90 tablet, Rfl: 2 Past Medical History:  Diagnosis Date   COVID-19    Hyperlipidemia    Hypertension    Rheumatic heart disease    MS/ AS    ASSESSMENT  Recent Results: The most recent result is correlated with 45 mg per week:  Lab Results  Component Value Date   INR 2.8 04/09/2021   INR 3.0 04/03/2021   INR 3.3 (A) 03/26/2021    Anticoagulation Dosing: Description   INR at goal. Continue current weekly dose of 5 mg every Tues, Thurs, Sat and 7.5 mg all other days. Recheck INR in 1 week     INR today: Therapeutic. INR continues to remain therapeutic on current weekly dose Pt notes stable Vit K intake. Denies any unusual bleeding or bruising symptoms. Denies any other relevant changes in diet, medications, or lifestyle. Amiodarone dose stable at 200 mg. Will continue current weekly dose and continue remote monitoring.   PLAN Weekly dose was unchanged by 0 % to 45 mg/week. Continue current weekly dose of 5 mg every Tues, Thurs, Sat and 7.5 mg all other days. Recheck INR in 1 week.   Patient Instructions  INR at goal. Continue current weekly dose of 5 mg every Tues, Thurs, Sat and 7.5 mg all other days. Recheck INR in 1 week Patient advised to contact clinic or seek medical attention if signs/symptoms of bleeding or thromboembolism occur.  Patient verbalized understanding by repeating back information and was advised to contact me if further medication-related questions arise.   Follow-up Return in about 1 week  (around 04/16/2021).  [name], PharmD  15 minutes spent face-to-face with the patient during the encounter. 50% of time spent on education, including signs/sx bleeding and clotting, as well as food and drug interactions with warfarin. 50% of time was spent on fingerprick POC INR sample collection,processing, results determination, and documentation

## 2021-04-09 NOTE — Telephone Encounter (Signed)
This encounter was created in error - please disregard.

## 2021-04-13 ENCOUNTER — Other Ambulatory Visit: Payer: Self-pay

## 2021-04-13 ENCOUNTER — Encounter: Payer: Self-pay | Admitting: Psychiatry

## 2021-04-13 ENCOUNTER — Ambulatory Visit (INDEPENDENT_AMBULATORY_CARE_PROVIDER_SITE_OTHER): Payer: Medicare Other | Admitting: Psychiatry

## 2021-04-13 VITALS — BP 137/70 | HR 67 | Temp 97.3°F | Wt 218.6 lb

## 2021-04-13 DIAGNOSIS — F411 Generalized anxiety disorder: Secondary | ICD-10-CM | POA: Diagnosis not present

## 2021-04-13 DIAGNOSIS — F4321 Adjustment disorder with depressed mood: Secondary | ICD-10-CM | POA: Diagnosis not present
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
## 2021-04-17 ENCOUNTER — Ambulatory Visit: Payer: Self-pay | Admitting: Pharmacist

## 2021-04-17 DIAGNOSIS — Z7901 Long term (current) use of anticoagulants: Secondary | ICD-10-CM

## 2021-04-17 DIAGNOSIS — Z952 Presence of prosthetic heart valve: Secondary | ICD-10-CM

## 2021-04-17 DIAGNOSIS — Z5181 Encounter for therapeutic drug level monitoring: Secondary | ICD-10-CM | POA: Diagnosis not present

## 2021-04-17 LAB — POCT INR: INR: 2.9 (ref 2.0–3.0)

## 2021-04-17 NOTE — Progress Notes (Signed)
Anticoagulation Management Kimberly Hobbs is a 61 y.o. female who reports to the clinic for monitoring of warfarin treatment.    Indication: atrial fibrillation and Hx of Mechanical mitral and aortic valve replacement  ; CHA2DS2 Vasc Score 4 (Female, HTN, DM, Vascular disease hx), HAS-BLED 1 (ASA, prior bleed hx (hematuria, hematochezia)  Duration: indefinite Supervising physician: [facility] Visit History:  Patient does not report signs/symptoms of bleeding or thromboembolism   Other recent changes: No changes in diet, medications, lifestyle.   Pt maintaining steady Vit-K intake of spinach salad 3-4/week.  Stable on amiodarone 200 mg daily.   Anticoagulation Episode Summary     Current INR goal:  2.5-3.5  TTR:  59.7 % (3 y)  Next INR check:  04/24/2021  INR from last check:  2.9 (04/17/2021)  Weekly max warfarin dose:    Target end date:  Indefinite  INR check location:    Preferred lab:    Send INR reminders to:     Indications   Status post mechanical aortic valve replacement [Z95.2] Monitoring for long-term anticoagulant use [Z51.81 Z79.01]        Comments:           Allergies  Allergen Reactions   Iodinated Contrast Media Other (See Comments)    Patient is unsure of reaction type   Penicillins Other (See Comments)    Immune to drug , does not work per patient  Did it involve swelling of the face/tongue/throat, SOB, or low BP? No Did it involve sudden or severe rash/hives, skin peeling, or any reaction on the inside of your mouth or nose? No Did you need to seek medical attention at a hospital or doctor's office? No When did it last happen? NOT A TRUE ALLERGY   If all above answers are NO, may proceed with cephalosporin use.     Current Outpatient Medications:    acetaminophen (TYLENOL) 500 MG tablet, Take 500 mg by mouth every 6 (six) hours as needed (for pain.)., Disp: , Rfl:    albuterol (VENTOLIN HFA) 108 (90 Base)  MCG/ACT inhaler, Inhale 2 puffs into the lungs every 6 (six) hours as needed for wheezing or shortness of breath. , Disp: , Rfl:    amiodarone (PACERONE) 100 MG tablet, Take 100 mg by mouth daily., Disp: , Rfl:    aspirin EC 81 MG tablet, Take 81 mg by mouth daily. , Disp: , Rfl:    citalopram (CELEXA) 40 MG tablet, Take 20 mg by mouth 2 (two) times daily., Disp: , Rfl:    clonazePAM (KLONOPIN) 0.5 MG tablet, Take 1.5 tablets (0.75 mg total) by mouth at bedtime., Disp: 45 tablet, Rfl: 0   diltiazem (CARDIZEM CD) 180 MG 24 hr capsule, TAKE ONE CAPSULE BY MOUTH DAILY, Disp: 90 capsule, Rfl: 2   furosemide (LASIX) 20 MG tablet, TAKE 1 TABLET BY MOUTH DAILY, Disp: 90 tablet, Rfl: 2   isosorbide mononitrate (IMDUR) 60 MG 24 hr tablet, Take 1 tablet (60 mg total) by mouth daily., Disp: 90 tablet, Rfl: 3   Lancets (ONETOUCH DELICA PLUS [id number]) MISC, TAKE AS DIRECTED ONCE DAILY., Disp: , Rfl:    levalbuterol (XOPENEX HFA) 45 MCG/ACT inhaler, Inhale 1-2 puffs into the lungs every 6 (six) hours as needed for shortness of breath., Disp: 1 Inhaler, Rfl: 12   losartan (COZAAR) 50 MG tablet, TAKE 1 TABLET BY MOUTH DAILY, Disp: 30 tablet, Rfl: 6   metFORMIN (GLUCOPHAGE) 500 MG tablet, Take 500 mg by mouth  2 (two) times daily., Disp: , Rfl:    nitroGLYCERIN (NITROSTAT) 0.4 MG SL tablet, Place 1 tablet (0.4 mg total) under the tongue every 5 (five) minutes x 3 doses as needed for chest pain., Disp: 25 tablet, Rfl: 3   ONETOUCH VERIO test strip, AS DIRECTED ONCE A DAY, Disp: , Rfl:    pantoprazole (PROTONIX) 40 MG tablet, TAKE 1 TABLET(40 MG) BY MOUTH DAILY, Disp: 90 tablet, Rfl: 1   rosuvastatin (CRESTOR) 40 MG tablet, Take 1 tablet (40 mg total) by mouth daily., Disp: 90 tablet, Rfl: 1   spironolactone (ALDACTONE) 50 MG tablet, TAKE 1 TABLET(50 MG) BY MOUTH DAILY, Disp: 30 tablet, Rfl: 6   STIOLTO RESPIMAT 2.5-2.5 MCG/ACT AERS, INHALE 2 PUFFS INTO THE LUNGS DAILY, Disp: 4 g, Rfl: 5   warfarin (COUMADIN) 5 MG  tablet, Take 1 tablet (5 mg total) by mouth daily. Refer to most recent anticoagulation note for most accurate information. (Patient taking differently: Take 5 mg by mouth daily. or as directed by coumadin clinic  Currently on: 5 mg every Wed; 7.5 mg all other days), Disp: 90 tablet, Rfl: 2   warfarin (COUMADIN) 7.5 MG tablet, TAKE 1 TABLET BY MOUTH EVERY EVENING (Patient taking differently: 5 mg every Wed; 7.5 mg all other days or as directed by coumadin clinic), Disp: 90 tablet, Rfl: 2 Past Medical History:  Diagnosis Date   COVID-19    Hyperlipidemia    Hypertension    Rheumatic heart disease    MS/ AS    ASSESSMENT  Recent Results: The most recent result is correlated with 45 mg per week:  Lab Results  Component Value Date   INR 2.9 04/17/2021   INR 2.8 04/09/2021   INR 3.0 04/03/2021    Anticoagulation Dosing: Description   INR at goal. Continue current weekly dose of 5 mg every Tues, Thurs, Sat and 7.5 mg all other days. Recheck INR in 1 week     INR today: Therapeutic. INR continues to remain therapeutic on current weekly dose Pt notes stable Vit K intake. Denies any unusual bleeding or bruising symptoms. Denies any other relevant changes in diet, medications, or lifestyle. Amiodarone dose stable at 200 mg. Will continue current weekly dose and continue remote monitoring.   PLAN Weekly dose was unchanged by 0 % to 45 mg/week. Continue current weekly dose of 5 mg every Tues, Thurs, Sat and 7.5 mg all other days. Recheck INR in 1 week.   Patient Instructions  INR at goal. Continue current weekly dose of 5 mg every Tues, Thurs, Sat and 7.5 mg all other days. Recheck INR in 1 week Patient advised to contact clinic or seek medical attention if signs/symptoms of bleeding or thromboembolism occur.  Patient verbalized understanding by repeating back information and was advised to contact me if further medication-related questions arise.   Follow-up Return in about 1 week  (around 04/24/2021).  Kimberly Hobbs, PharmD  15 minutes spent face-to-face with the patient during the encounter. 50% of time spent on education, including signs/sx bleeding and clotting, as well as food and drug interactions with warfarin. 50% of time was spent on fingerprick POC INR sample collection,processing, results determination, and documentation

## 2021-04-17 NOTE — Patient Instructions (Signed)
INR at goal. Continue current weekly dose of 5 mg every Tues, Thurs, Sat and 7.5 mg all other days. Recheck INR in 1 week

## 2021-04-19 ENCOUNTER — Ambulatory Visit: Payer: Medicaid Other | Admitting: Psychiatry

## 2021-04-20 ENCOUNTER — Telehealth: Payer: Self-pay | Admitting: Nurse Practitioner

## 2021-04-20 NOTE — Telephone Encounter (Signed)
Caller Name: shelia Zoll ?Call back phone #: [phone] ? ?MEDICATION(S): clonazePAM (KLONOPIN) 0.5 MG tablet [[id number]]  ? ? ?Days of Med Remaining:  ? ?Has the patient contacted their pharmacy (YES/NO)?  yes ?IF YES, when and what did the pharmacy advise? She needs to contact her PCP ?IF NO, request that the patient contact the pharmacy for the refills in the future.  ?           The pharmacy will send an electronic request (except for controlled medications). ? ?Preferred Pharmacy: [facility] [place], [place] - [address] AT [address]  ?[address], [place]  [address]  ?Phone:  [phone]  Fax:  [phone]  ?DEA #:  [id number] ? ?~~~Please advise patient/caregiver to allow 2-3 business days to process RX refills. ? ?

## 2021-04-21 ENCOUNTER — Other Ambulatory Visit: Payer: Self-pay

## 2021-04-21 ENCOUNTER — Emergency Department (HOSPITAL_COMMUNITY)
Admission: EM | Admit: 2021-04-21 | Discharge: 2021-04-21 | Disposition: A | Discharge status: Home / Self Care | Payer: Medicare Other | Attending: Emergency Medicine | Admitting: Emergency Medicine

## 2021-04-21 ENCOUNTER — Encounter (HOSPITAL_COMMUNITY): Payer: Self-pay | Admitting: Emergency Medicine

## 2021-04-21 DIAGNOSIS — F411 Generalized anxiety disorder: Secondary | ICD-10-CM

## 2021-04-21 DIAGNOSIS — Z951 Presence of aortocoronary bypass graft: Secondary | ICD-10-CM | POA: Insufficient documentation

## 2021-04-21 DIAGNOSIS — Z760 Encounter for issue of repeat prescription: Secondary | ICD-10-CM | POA: Diagnosis not present

## 2021-04-21 DIAGNOSIS — Z7982 Long term (current) use of aspirin: Secondary | ICD-10-CM | POA: Diagnosis not present

## 2021-04-21 DIAGNOSIS — I2510 Atherosclerotic heart disease of native coronary artery without angina pectoris: Secondary | ICD-10-CM | POA: Diagnosis not present

## 2021-04-21 DIAGNOSIS — Z7901 Long term (current) use of anticoagulants: Secondary | ICD-10-CM | POA: Insufficient documentation

## 2021-04-21 DIAGNOSIS — Z79899 Other long term (current) drug therapy: Secondary | ICD-10-CM | POA: Diagnosis not present

## 2021-04-21 DIAGNOSIS — H5789 Other specified disorders of eye and adnexa: Secondary | ICD-10-CM | POA: Insufficient documentation

## 2021-04-21 MED ORDER — CLONAZEPAM 0.5 MG PO TABS: 0.7500 mg | ORAL_TABLET | Freq: Every day | ORAL | 0 refills | Status: DC

## 2021-04-21 NOTE — ED Provider Notes (Signed)
?[facility] ?Provider Note ? ? ?CSN: [id number] ?Arrival date & time: 04/21/21  1436 ? ?  ? ?History ? ?Chief Complaint  ?Patient presents with  ? Eye Pain  ? ? ?Kimberly Hobbs is a 61 y.o. female hx of CAD s/p CABG, afib on coumadin, here with headache, needing refill of her klonopin. Patient states that she ran out of klonopin 2 days ago. She has been having R sided headache and pain behind R eye since then. Denies any falls or vomiting or numbness or weakness. Patient called PCP and was sent to the ED for evaluation. Patient had INR checked this week and it was 2.9.  ? ?The history is provided by the patient.  ? ?  ? ?Home Medications ?Prior to Admission medications   ?Medication Sig Start Date End Date Taking? Authorizing Provider  ?acetaminophen (TYLENOL) 500 MG tablet Take 500 mg by mouth every 6 (six) hours as needed (for pain.).    [provider]  ?albuterol (VENTOLIN HFA) 108 (90 Base) MCG/ACT inhaler Inhale 2 puffs into the lungs every 6 (six) hours as needed for wheezing or shortness of breath.     [provider]  ?amiodarone (PACERONE) 100 MG tablet Take 100 mg by mouth daily.    [provider]  ?aspirin EC 81 MG tablet Take 81 mg by mouth daily.     [provider]  ?citalopram (CELEXA) 40 MG tablet Take 20 mg by mouth 2 (two) times daily. 07/10/18   [provider]  ?clonazePAM (KLONOPIN) 0.5 MG tablet Take 1.5 tablets (0.75 mg total) by mouth at bedtime. 04/21/21   Drenda Freeze, MD  ?diltiazem (CARDIZEM CD) 180 MG 24 hr capsule TAKE ONE CAPSULE BY MOUTH DAILY 08/28/20   [name], Reynold Bowen, MD  ?furosemide (LASIX) 20 MG tablet TAKE 1 TABLET BY MOUTH DAILY 10/16/20   [name], Reynold Bowen, MD  ?isosorbide mononitrate (IMDUR) 60 MG 24 hr tablet Take 1 tablet (60 mg total) by mouth daily. 06/19/20   [name], Reynold Bowen, MD  ?Lancets (ONETOUCH DELICA PLUS [id number]) MISC TAKE AS DIRECTED ONCE DAILY. 04/26/19   [provider]  ?levalbuterol Penne Lash HFA) 45 MCG/ACT inhaler Inhale 1-2 puffs into the lungs every 6 (six) hours as needed for shortness of breath. 02/22/19   Spero Geralds, MD  ?losartan (COZAAR) 50 MG tablet TAKE 1 TABLET BY MOUTH DAILY 11/28/20   Cantwell, Celeste C, PA-C  ?metFORMIN (GLUCOPHAGE) 500 MG tablet Take 500 mg by mouth 2 (two) times daily. 02/15/19   [provider]  ?nitroGLYCERIN (NITROSTAT) 0.4 MG SL tablet Place 1 tablet (0.4 mg total) under the tongue every 5 (five) minutes x 3 doses as needed for chest pain. 01/24/20   [name], Reynold Bowen, MD  ?Glory Rosebush VERIO test strip AS DIRECTED ONCE A DAY 04/26/19   [provider]  ?pantoprazole (PROTONIX) 40 MG tablet TAKE 1 TABLET(40 MG) BY MOUTH DAILY 10/28/19   [name], Manish J, MD  ?rosuvastatin (CRESTOR) 40 MG tablet Take 1 tablet (40 mg total) by mouth daily. 01/20/20   [name], Reynold Bowen, MD  ?spironolactone (ALDACTONE) 50 MG tablet TAKE 1 TABLET(50 MG) BY MOUTH DAILY 11/27/20   [name], Reynold Bowen, MD  ?STIOLTO RESPIMAT 2.5-2.5 MCG/ACT AERS INHALE 2 PUFFS INTO THE LUNGS DAILY 02/14/20   Spero Geralds, MD  ?warfarin (COUMADIN) 5 MG tablet Take 1 tablet (5 mg total) by mouth daily. Refer to most recent anticoagulation note for most accurate  information. ?Patient taking differently: Take 5 mg by mouth daily. or as directed by coumadin clinic ? ?Currently on: 5 mg every Wed; 7.5 mg all other days 10/05/19   Nigel Mormon, MD  ?warfarin (COUMADIN) 7.5 MG tablet TAKE 1 TABLET BY MOUTH EVERY EVENING ?Patient taking differently: 5 mg every Wed; 7.5 mg all other days or as directed by coumadin clinic 03/22/20   Nigel Mormon, MD  ?   ? ?Allergies    ?Iodinated contrast media and Penicillins   ? ?Review of Systems   ?Review of Systems  ?Eyes:  Positive for pain.  ?All other systems reviewed and are negative. ? ?Physical Exam ?Updated Vital Signs ?BP (!) 125/46 (BP Location: Right Arm)   Pulse (!) 53   Temp 98.4 ?F (36.9  ?C) (Oral)   Resp 18   SpO2 98%  ?Physical Exam ?Vitals and nursing note reviewed.  ?Constitutional:   ?   Appearance: Normal appearance.  ?HENT:  ?   Head: Normocephalic.  ?   Nose: Nose normal.  ?   Mouth/Throat:  ?   Mouth: Mucous membranes are moist.  ?Eyes:  ?   Extraocular Movements: Extraocular movements intact.  ?   Pupils: Pupils are equal, round, and reactive to light.  ?   Comments: Extra ocular movements intact. Pupils equal and reactive   ?Cardiovascular:  ?   Rate and Rhythm: Normal rate and regular rhythm.  ?   Pulses: Normal pulses.  ?   Heart sounds: Normal heart sounds.  ?Pulmonary:  ?   Effort: Pulmonary effort is normal.  ?   Breath sounds: Normal breath sounds.  ?Abdominal:  ?   General: Abdomen is flat.  ?   Palpations: Abdomen is soft.  ?Musculoskeletal:     ?   General: Normal range of motion.  ?   Cervical back: Normal range of motion and neck supple.  ?Skin: ?   General: Skin is warm.  ?   Capillary Refill: Capillary refill takes less than 2 seconds.  ?Neurological:  ?   General: No focal deficit present.  ?   Mental Status: She is alert and oriented to person, place, and time.  ?   Cranial Nerves: No cranial nerve deficit.  ?   Sensory: No sensory deficit.  ?   Motor: No weakness.  ?   Coordination: Coordination normal.  ?Psychiatric:     ?   Mood and Affect: Mood normal.     ?   Behavior: Behavior normal.  ? ? ?ED Results / Procedures / Treatments   ?Labs ?(all labs ordered are listed, but only abnormal results are displayed) ?Labs Reviewed - No data to display ? ?EKG ?None ? ?Radiology ?No results found. ? ?Procedures ?Procedures  ? ? ?Medications Ordered in ED ?Medications - No data to display ? ?ED Course/ Medical Decision Making/ A&P ?  ?                        ?Medical Decision Making ?Kimberly Hobbs is a 61 y.o. female here with needing klonopin refill. Patient ran out 2 days ago. I checked database and she filled 45 pills on 03/19/21. 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 Vitals stable. She is on coumadin but has no signs of head injury and has nonfocal neuro exam. Will hold off on imaging. Will dc home and give short refill of klonopin.  ? ? ?Problems Addressed: ?Medication  refill: acute illness or injury ? ?Amount and/or Complexity of Data Reviewed ?External Data Reviewed: labs and notes. ? ?Risk ?Prescription drug management. ? ? ?Final Clinical Impression(s) / ED Diagnoses ?Final diagnoses:  ?GAD (generalized anxiety disorder)  ? ? ?Rx / DC Orders ?ED Discharge Orders   ? ?      Ordered  ?  clonazePAM (KLONOPIN) 0.5 MG tablet  Daily at bedtime       ?Note to Pharmacy: Correction in dose  ? 04/21/21 1520  ? ?  ?  ? ?  ? ? ?  ?Drenda Freeze, MD ?04/21/21 1532 ? ?

## 2021-04-21 NOTE — Discharge Instructions (Signed)
Take your klonopin as needed  ? ?See your doctor  ? ?Return to ER if you have worse headache, vomiting, dizziness, thoughts of harming yourself or others  ?

## 2021-04-21 NOTE — ED Triage Notes (Addendum)
Pt states she called PCP because she was out of her Klonopin.  Also reports waking up at 7am with R eye pain/headache.  States PCP instructed her to come to ED for further eval since she takes Coumadin.  No neuro deficits noted on triage exam. ?

## 2021-04-23 ENCOUNTER — Ambulatory Visit: Payer: Self-pay | Admitting: Pharmacist

## 2021-04-23 ENCOUNTER — Other Ambulatory Visit: Payer: Self-pay | Admitting: Nurse Practitioner

## 2021-04-23 ENCOUNTER — Encounter: Payer: Self-pay | Admitting: Cardiology

## 2021-04-23 DIAGNOSIS — Z5181 Encounter for therapeutic drug level monitoring: Secondary | ICD-10-CM | POA: Diagnosis not present

## 2021-04-23 DIAGNOSIS — F411 Generalized anxiety disorder: Secondary | ICD-10-CM

## 2021-04-23 DIAGNOSIS — Z7901 Long term (current) use of anticoagulants: Secondary | ICD-10-CM

## 2021-04-23 DIAGNOSIS — Z952 Presence of prosthetic heart valve: Secondary | ICD-10-CM | POA: Diagnosis not present

## 2021-04-23 DIAGNOSIS — I480 Paroxysmal atrial fibrillation: Secondary | ICD-10-CM | POA: Diagnosis not present

## 2021-04-23 LAB — POCT INR: INR: 2.5 (ref 2.0–3.0)

## 2021-04-23 NOTE — Patient Instructions (Signed)
INR at goal. Increase weekly dose to 5 mg every Thurs, Sat and 7.5 mg all other days. Recheck INR in 1 week ?

## 2021-04-23 NOTE — Telephone Encounter (Signed)
From patient.

## 2021-04-23 NOTE — Telephone Encounter (Signed)
Caller Name: Esta Carmon ?Call back phone #: [phone] ? ?MEDICATION(S): Klonupin, Citalotram, Panpopra, Amiodarone, Losartan ? ? ? ? ?Has the patient contacted their pharmacy (YES/NO)?  Yes ?IF YES, when and what did the pharmacy advise?  ?IF NO, request that the patient contact the pharmacy for the refills in the future.  ?           The pharmacy will send an electronic request (except for controlled medications). ? ?Preferred Pharmacy: [facility] in [place] ? ?~~~Please advise patient/caregiver to allow 2-3 business days to process RX refills. ? ?

## 2021-04-23 NOTE — Progress Notes (Signed)
Anticoagulation Management ?Kimberly Hobbs is a 61 y.o. female who reports to the clinic for monitoring of warfarin treatment.   ? ?Indication: atrial fibrillation and Hx of Mechanical mitral and aortic valve replacement  ; CHA2DS2 Vasc Score 4 (Female, HTN, DM, Vascular disease hx), HAS-BLED 1 (ASA, prior bleed hx (hematuria, hematochezia) ? ?Duration: indefinite ?Supervising physician: Vernell Leep ? ?Anticoagulation Clinic Visit History: ? ?Patient does not report signs/symptoms of bleeding or thromboembolism  ? ?Other recent changes: No changes in diet, medications, lifestyle.  ? ?Pt maintaining steady Vit-K intake of spinach salad 3-4/week. ? ?Stable on amiodarone 200 mg daily.  ? ?Anticoagulation Episode Summary   ? ? Current INR goal:  2.5-3.5  ?TTR:  59.9 % (3 y)  ?Next INR check:  04/30/2021  ?INR from last check:  2.5 (04/23/2021)  ?Weekly max warfarin dose:    ?Target end date:  Indefinite  ?INR check location:    ?Preferred lab:    ?Send INR reminders to:    ? Indications   ?Status post mechanical aortic valve replacement [Z95.2] ?Monitoring for long-term anticoagulant use [Z51.81 ?Z79.01] ? ?  ?  ? ? Comments:    ?  ? ?  ? ? ?Allergies  ?Allergen Reactions  ? Iodinated Contrast Media Other (See Comments)  ?  Patient is unsure of reaction type  ? Penicillins Other (See Comments)  ?  Immune to drug , does not work per patient ? ?Did it involve swelling of the face/tongue/throat, SOB, or low BP? No ?Did it involve sudden or severe rash/hives, skin peeling, or any reaction on the inside of your mouth or nose? No ?Did you need to seek medical attention at a hospital or doctor's office? No ?When did it last happen? NOT A TRUE ALLERGY   ?If all above answers are ?NO?, may proceed with cephalosporin use. ?  ? ? ?Current Outpatient Medications:  ?  acetaminophen (TYLENOL) 500 MG tablet, Take 500 mg by mouth every 6 (six) hours as needed (for pain.)., Disp: , Rfl:  ?  albuterol (VENTOLIN HFA) 108 (90 Base)  MCG/ACT inhaler, Inhale 2 puffs into the lungs every 6 (six) hours as needed for wheezing or shortness of breath. , Disp: , Rfl:  ?  amiodarone (PACERONE) 100 MG tablet, Take 100 mg by mouth daily., Disp: , Rfl:  ?  aspirin EC 81 MG tablet, Take 81 mg by mouth daily. , Disp: , Rfl:  ?  citalopram (CELEXA) 40 MG tablet, Take 20 mg by mouth 2 (two) times daily., Disp: , Rfl:  ?  clonazePAM (KLONOPIN) 0.5 MG tablet, Take 1.5 tablets (0.75 mg total) by mouth at bedtime., Disp: 10 tablet, Rfl: 0 ?  diltiazem (CARDIZEM CD) 180 MG 24 hr capsule, TAKE ONE CAPSULE BY MOUTH DAILY, Disp: 90 capsule, Rfl: 2 ?  furosemide (LASIX) 20 MG tablet, TAKE 1 TABLET BY MOUTH DAILY, Disp: 90 tablet, Rfl: 2 ?  isosorbide mononitrate (IMDUR) 60 MG 24 hr tablet, Take 1 tablet (60 mg total) by mouth daily., Disp: 90 tablet, Rfl: 3 ?  Lancets (ONETOUCH DELICA PLUS [id number]) MISC, TAKE AS DIRECTED ONCE DAILY., Disp: , Rfl:  ?  levalbuterol (XOPENEX HFA) 45 MCG/ACT inhaler, Inhale 1-2 puffs into the lungs every 6 (six) hours as needed for shortness of breath., Disp: 1 Inhaler, Rfl: 12 ?  losartan (COZAAR) 50 MG tablet, TAKE 1 TABLET BY MOUTH DAILY, Disp: 30 tablet, Rfl: 6 ?  metFORMIN (GLUCOPHAGE) 500 MG tablet, Take 500 mg by mouth  2 (two) times daily., Disp: , Rfl:  ?  nitroGLYCERIN (NITROSTAT) 0.4 MG SL tablet, Place 1 tablet (0.4 mg total) under the tongue every 5 (five) minutes x 3 doses as needed for chest pain., Disp: 25 tablet, Rfl: 3 ?  ONETOUCH VERIO test strip, AS DIRECTED ONCE A DAY, Disp: , Rfl:  ?  pantoprazole (PROTONIX) 40 MG tablet, TAKE 1 TABLET(40 MG) BY MOUTH DAILY, Disp: 90 tablet, Rfl: 1 ?  rosuvastatin (CRESTOR) 40 MG tablet, Take 1 tablet (40 mg total) by mouth daily., Disp: 90 tablet, Rfl: 1 ?  spironolactone (ALDACTONE) 50 MG tablet, TAKE 1 TABLET(50 MG) BY MOUTH DAILY, Disp: 30 tablet, Rfl: 6 ?  STIOLTO RESPIMAT 2.5-2.5 MCG/ACT AERS, INHALE 2 PUFFS INTO THE LUNGS DAILY, Disp: 4 g, Rfl: 5 ?  warfarin (COUMADIN) 5 MG  tablet, Take 1 tablet (5 mg total) by mouth daily. Refer to most recent anticoagulation note for most accurate information. (Patient taking differently: Take 5 mg by mouth daily. or as directed by coumadin clinic  Currently on: 5 mg every Wed; 7.5 mg all other days), Disp: 90 tablet, Rfl: 2 ?  warfarin (COUMADIN) 7.5 MG tablet, TAKE 1 TABLET BY MOUTH EVERY EVENING (Patient taking differently: 5 mg every Wed; 7.5 mg all other days or as directed by coumadin clinic), Disp: 90 tablet, Rfl: 2 ?Past Medical History:  ?Diagnosis Date  ? COVID-19   ? Hyperlipidemia   ? Hypertension   ? Rheumatic heart disease   ? MS/ AS  ? ? ?ASSESSMENT ? ?Recent Results: ?The most recent result is correlated with 45 mg per week: ? ?Lab Results  ?Component Value Date  ? INR 2.5 04/23/2021  ? INR 2.9 04/17/2021  ? INR 2.8 04/09/2021  ? ? ?Anticoagulation Dosing: ?Description   ?INR at goal. Increase weekly dose to 5 mg every Thurs, Sat and 7.5 mg all other days. Recheck INR in 1 week ?  ?  ?INR today: Therapeutic. INR continues to remain therapeutic on current weekly dose Pt notes stable Vit K intake. Denies any unusual bleeding or bruising symptoms. Denies any other relevant changes in diet, medications, or lifestyle. Amiodarone dose stable at 200 mg. Will continue current weekly dose and continue remote monitoring.  ? ?PLAN ?Weekly dose was unchanged by 0 % to 45 mg/week. Continue current weekly dose of 5 mg every Tues, Thurs, Sat and 7.5 mg all other days. Recheck INR in 1 week.  ? ?Patient Instructions  ?INR at goal. Increase weekly dose to 5 mg every Thurs, Sat and 7.5 mg all other days. Recheck INR in 1 week ?Patient advised to contact clinic or seek medical attention if signs/symptoms of bleeding or thromboembolism occur. ? ?Patient verbalized understanding by repeating back information and was advised to contact me if further medication-related questions arise.  ? ?Follow-up ?Return in about 1 week (around 04/30/2021). ? ?[name], PharmD ? ?15 minutes spent face-to-face with the patient during the encounter. 50% of time spent on education, including signs/sx bleeding and clotting, as well as food and drug interactions with warfarin. 50% of time was spent on fingerprick POC INR sample collection,processing, results determination, and documentation ?

## 2021-04-23 NOTE — Telephone Encounter (Signed)
Pt chart shows Rx filled by another provider on 04/21/21 with a change in the dosage. Closing this encounter.  ?

## 2021-04-25 ENCOUNTER — Other Ambulatory Visit: Payer: Self-pay

## 2021-04-25 ENCOUNTER — Telehealth: Payer: Self-pay

## 2021-04-25 MED ORDER — PANTOPRAZOLE SODIUM 40 MG PO TBEC: 40.0000 mg | DELAYED_RELEASE_TABLET | Freq: Every day | ORAL | 0 refills | Status: DC

## 2021-04-25 MED ORDER — CITALOPRAM HYDROBROMIDE 40 MG PO TABS: 20.0000 mg | ORAL_TABLET | Freq: Two times a day (BID) | ORAL | 0 refills | Status: DC

## 2021-04-25 MED ORDER — AMIODARONE HCL 100 MG PO TABS: 100.0000 mg | ORAL_TABLET | Freq: Every day | ORAL | 0 refills | Status: DC

## 2021-04-25 MED ORDER — AMIODARONE HCL 100 MG PO TABS: 100.0000 mg | ORAL_TABLET | Freq: Every day | ORAL | 2 refills | Status: DC

## 2021-04-25 MED ORDER — LOSARTAN POTASSIUM 50 MG PO TABS: 50.0000 mg | ORAL_TABLET | Freq: Every day | ORAL | 0 refills | Status: DC

## 2021-04-25 MED ORDER — CLONAZEPAM 0.5 MG PO TABS: 0.7500 mg | ORAL_TABLET | Freq: Every day | ORAL | 0 refills | Status: DC

## 2021-04-25 NOTE — Telephone Encounter (Signed)
Pt called requesting a refill for her amiodarone. Please advise. She also requested we call her when this is done. ?

## 2021-04-25 NOTE — Telephone Encounter (Signed)
Pt states she was out of her Clonazepam for a few days and had to go to the ER. States ER doctor called her in 10 pills but she is in need of refill asap. ? ?PMP Database has last fills as follows ? ?04/21/21 quantity 10 (Dr. Jerold Coombe) ?03/19/21 quantity 45 ?03/01/21 quantity  30 ?

## 2021-04-25 NOTE — Telephone Encounter (Signed)
Medication has been refilled and called patient to make aware no answer left a vm

## 2021-04-25 NOTE — Telephone Encounter (Signed)
Please send 100 mg 90 pillsX2 refills ? ?Thanks ?MJP ? ?

## 2021-04-30 ENCOUNTER — Ambulatory Visit: Payer: Self-pay | Admitting: Pharmacist

## 2021-04-30 DIAGNOSIS — Z952 Presence of prosthetic heart valve: Secondary | ICD-10-CM | POA: Diagnosis not present

## 2021-04-30 DIAGNOSIS — Z7901 Long term (current) use of anticoagulants: Secondary | ICD-10-CM | POA: Diagnosis not present

## 2021-04-30 DIAGNOSIS — Z5181 Encounter for therapeutic drug level monitoring: Secondary | ICD-10-CM

## 2021-04-30 LAB — POCT INR: INR: 2.9 (ref 2.0–3.0)

## 2021-04-30 NOTE — Patient Instructions (Signed)
INR at goal. Continue current weekly dose of 5 mg every Thurs, Sat and 7.5 mg all other days. Recheck INR in 1 week ?

## 2021-04-30 NOTE — Progress Notes (Signed)
Anticoagulation Management ?Kimberly Hobbs is a 61 y.o. female who reports to the clinic for monitoring of warfarin treatment.   ? ?Indication: atrial fibrillation and Hx of Mechanical mitral and aortic valve replacement  ; CHA2DS2 Vasc Score 4 (Female, HTN, DM, Vascular disease hx), HAS-BLED 1 (ASA, prior bleed hx (hematuria, hematochezia) ? ?Duration: indefinite ?Supervising physician: Vernell Leep ? ?Anticoagulation Clinic Visit History: ? ?Patient does not report signs/symptoms of bleeding or thromboembolism  ? ?Other recent changes: No changes in diet, medications, lifestyle.  ? ?Pt maintaining steady Vit-K intake of spinach salad 3-4/week. ? ?Stable on amiodarone 100 mg daily.  ? ?Pt reports that she has been having persistent calf pain at night over the past week or so. Denies any swelling, bruising, changes in color. Reports that the pain are bilateral and only present when she is getting ready for bed. Using PRN tylenol 100 mg nightly with only mild improvement. Recommended pt follow with PCP at her upcoming appt on 05/10/21. Pt continues to have mild intermittent headaches, but has been improving. Denies any complains of severe headaches, vision changes, slurred speeches, numbness, weakness.  ? ?Anticoagulation Episode Summary   ? ? Current INR goal:  2.5-3.5  ?TTR:  60.2 % (3.1 y)  ?Next INR check:  05/07/2021  ?INR from last check:  2.9 (04/30/2021)  ?Weekly max warfarin dose:    ?Target end date:  Indefinite  ?INR check location:    ?Preferred lab:    ?Send INR reminders to:    ? Indications   ?Status post mechanical aortic valve replacement [Z95.2] ?Monitoring for long-term anticoagulant use [Z51.81 ?Z79.01] ? ?  ?  ? ? Comments:    ?  ? ?  ? ? ?Allergies  ?Allergen Reactions  ? Iodinated Contrast Media Other (See Comments)  ?  Patient is unsure of reaction type  ? Penicillins Other (See Comments)  ?  Immune to drug , does not work per patient ? ?Did it involve swelling of the face/tongue/throat,  SOB, or low BP? No ?Did it involve sudden or severe rash/hives, skin peeling, or any reaction on the inside of your mouth or nose? No ?Did you need to seek medical attention at a hospital or doctor's office? No ?When did it last happen? NOT A TRUE ALLERGY   ?If all above answers are ?NO?, may proceed with cephalosporin use. ?  ? ? ?Current Outpatient Medications:  ?  acetaminophen (TYLENOL) 500 MG tablet, Take 500 mg by mouth every 6 (six) hours as needed (for pain.)., Disp: , Rfl:  ?  albuterol (VENTOLIN HFA) 108 (90 Base) MCG/ACT inhaler, Inhale 2 puffs into the lungs every 6 (six) hours as needed for wheezing or shortness of breath. , Disp: , Rfl:  ?  amiodarone (PACERONE) 100 MG tablet, Take 1 tablet (100 mg total) by mouth daily., Disp: 90 tablet, Rfl: 0 ?  aspirin EC 81 MG tablet, Take 81 mg by mouth daily. , Disp: , Rfl:  ?  citalopram (CELEXA) 40 MG tablet, Take 0.5 tablets (20 mg total) by mouth 2 (two) times daily., Disp: 90 tablet, Rfl: 0 ?  clonazePAM (KLONOPIN) 0.5 MG tablet, Take 1.5 tablets (0.75 mg total) by mouth at bedtime. Maintain upcoming appt for additional refills, Disp: 45 tablet, Rfl: 0 ?  diltiazem (CARDIZEM CD) 180 MG 24 hr capsule, TAKE ONE CAPSULE BY MOUTH DAILY, Disp: 90 capsule, Rfl: 2 ?  furosemide (LASIX) 20 MG tablet, TAKE 1 TABLET BY MOUTH DAILY, Disp: 90 tablet, Rfl:  2 ?  isosorbide mononitrate (IMDUR) 60 MG 24 hr tablet, Take 1 tablet (60 mg total) by mouth daily., Disp: 90 tablet, Rfl: 3 ?  Lancets (ONETOUCH DELICA PLUS [id number]) MISC, TAKE AS DIRECTED ONCE DAILY., Disp: , Rfl:  ?  levalbuterol (XOPENEX HFA) 45 MCG/ACT inhaler, Inhale 1-2 puffs into the lungs every 6 (six) hours as needed for shortness of breath., Disp: 1 Inhaler, Rfl: 12 ?  losartan (COZAAR) 50 MG tablet, Take 1 tablet (50 mg total) by mouth daily., Disp: 90 tablet, Rfl: 0 ?  metFORMIN (GLUCOPHAGE) 500 MG tablet, Take 500 mg by mouth 2 (two) times daily., Disp: , Rfl:  ?  nitroGLYCERIN (NITROSTAT) 0.4 MG SL  tablet, Place 1 tablet (0.4 mg total) under the tongue every 5 (five) minutes x 3 doses as needed for chest pain., Disp: 25 tablet, Rfl: 3 ?  ONETOUCH VERIO test strip, AS DIRECTED ONCE A DAY, Disp: , Rfl:  ?  pantoprazole (PROTONIX) 40 MG tablet, Take 1 tablet (40 mg total) by mouth daily., Disp: 90 tablet, Rfl: 0 ?  rosuvastatin (CRESTOR) 40 MG tablet, Take 1 tablet (40 mg total) by mouth daily., Disp: 90 tablet, Rfl: 1 ?  spironolactone (ALDACTONE) 50 MG tablet, TAKE 1 TABLET(50 MG) BY MOUTH DAILY, Disp: 30 tablet, Rfl: 6 ?  STIOLTO RESPIMAT 2.5-2.5 MCG/ACT AERS, INHALE 2 PUFFS INTO THE LUNGS DAILY, Disp: 4 g, Rfl: 5 ?  warfarin (COUMADIN) 5 MG tablet, Take 1 tablet (5 mg total) by mouth daily. Refer to most recent anticoagulation note for most accurate information. (Patient taking differently: Take 5 mg by mouth daily. or as directed by coumadin clinic  Currently on: 5 mg every Wed; 7.5 mg all other days), Disp: 90 tablet, Rfl: 2 ?  warfarin (COUMADIN) 7.5 MG tablet, TAKE 1 TABLET BY MOUTH EVERY EVENING (Patient taking differently: 5 mg every Wed; 7.5 mg all other days or as directed by coumadin clinic), Disp: 90 tablet, Rfl: 2 ?Past Medical History:  ?Diagnosis Date  ? COVID-19   ? Hyperlipidemia   ? Hypertension   ? Rheumatic heart disease   ? MS/ AS  ? ? ?ASSESSMENT ? ?Recent Results: ?The most recent result is correlated with 45 mg per week: ? ?Lab Results  ?Component Value Date  ? INR 2.9 04/30/2021  ? INR 2.5 04/23/2021  ? INR 2.9 04/17/2021  ? ? ?Anticoagulation Dosing: ?Description   ?INR at goal. Continue current weekly dose of 5 mg every Thurs, Sat and 7.5 mg all other days. Recheck INR in 1 week ?  ?  ?INR today: Therapeutic. INR continues to remain therapeutic on current weekly dose Pt notes stable Vit K intake. Denies any unusual bleeding or bruising symptoms. Denies any other relevant changes in diet, medications, or lifestyle. Amiodarone dose stable at 100 mg. Will continue current weekly dose and  continue remote monitoring.  ? ?PLAN ?Weekly dose was unchanged by 0 % to 45 mg/week. Continue current weekly dose of 5 mg every Thurs, Sat and 7.5 mg all other days. Recheck INR in 1 week.  ? ?Patient Instructions  ?INR at goal. Continue current weekly dose of 5 mg every Thurs, Sat and 7.5 mg all other days. Recheck INR in 1 week ?Patient advised to contact clinic or seek medical attention if signs/symptoms of bleeding or thromboembolism occur. ? ?Patient verbalized understanding by repeating back information and was advised to contact me if further medication-related questions arise.  ? ?Follow-up ?Return in about 1 week (around 05/07/2021). ? ?  [name], PharmD ? ?15 minutes spent face-to-face with the patient during the encounter. 50% of time spent on education, including signs/sx bleeding and clotting, as well as food and drug interactions with warfarin. 50% of time was spent on fingerprick POC INR sample collection,processing, results determination, and documentation ?

## 2021-05-07 ENCOUNTER — Ambulatory Visit: Payer: Self-pay | Admitting: Pharmacist

## 2021-05-07 ENCOUNTER — Other Ambulatory Visit: Payer: Self-pay | Admitting: Cardiology

## 2021-05-07 DIAGNOSIS — Z952 Presence of prosthetic heart valve: Secondary | ICD-10-CM

## 2021-05-07 DIAGNOSIS — Z5181 Encounter for therapeutic drug level monitoring: Secondary | ICD-10-CM | POA: Diagnosis not present

## 2021-05-07 DIAGNOSIS — Z7901 Long term (current) use of anticoagulants: Secondary | ICD-10-CM | POA: Diagnosis not present

## 2021-05-07 LAB — POCT INR: INR: 3 (ref 2.0–3.0)

## 2021-05-07 NOTE — Patient Instructions (Signed)
INR at goal. Continue current weekly dose of 5 mg every Thurs, Sat and 7.5 mg all other days. Recheck INR in 1 week ?

## 2021-05-07 NOTE — Progress Notes (Signed)
Anticoagulation Management ?Kimberly Hobbs is a 61 y.o. female who reports to the clinic for monitoring of warfarin treatment.   ? ?Indication: atrial fibrillation and Hx of Mechanical mitral and aortic valve replacement  ; CHA2DS2 Vasc Score 4 (Female, HTN, DM, Vascular disease hx), HAS-BLED 1 (ASA, prior bleed hx (hematuria, hematochezia) ? ?Duration: indefinite ?Supervising physician: Vernell Leep ? ?Anticoagulation Clinic Visit History: ? ?Patient does not report signs/symptoms of bleeding or thromboembolism  ? ?Other recent changes: No changes in diet, medications, lifestyle.  ? ?Pt maintaining steady Vit-K intake of spinach salad 3-4/week. ? ?Stable on amiodarone 100 mg daily.  ? ?Pt reports that she has been having persistent calf pain at night over the past week or so. Denies any swelling, bruising, changes in color. Reports that the pain are bilateral and only present when she is getting ready for bed. Using PRN tylenol 100 mg nightly with only mild improvement. Recommended pt follow with PCP at her upcoming appt on 05/10/21. Pt continues to have mild intermittent headaches, but has been improving. Denies any complains of severe headaches, vision changes, slurred speeches, numbness, weakness.  ? ?Anticoagulation Episode Summary   ? ? Current INR goal:  2.5-3.5  ?TTR:  60.4 % (3.1 y)  ?Next INR check:  05/14/2021  ?INR from last check:  3.0 (05/07/2021)  ?Weekly max warfarin dose:    ?Target end date:  Indefinite  ?INR check location:    ?Preferred lab:    ?Send INR reminders to:    ? Indications   ?Status post mechanical aortic valve replacement [Z95.2] ?Monitoring for long-term anticoagulant use [Z51.81 ?Z79.01] ? ?  ?  ? ? Comments:    ?  ? ?  ? ? ?Allergies  ?Allergen Reactions  ? Iodinated Contrast Media Other (See Comments)  ?  Patient is unsure of reaction type  ? Penicillins Other (See Comments)  ?  Immune to drug , does not work per patient ? ?Did it involve swelling of the face/tongue/throat,  SOB, or low BP? No ?Did it involve sudden or severe rash/hives, skin peeling, or any reaction on the inside of your mouth or nose? No ?Did you need to seek medical attention at a hospital or doctor's office? No ?When did it last happen? NOT A TRUE ALLERGY   ?If all above answers are ?NO?, may proceed with cephalosporin use. ?  ? ? ?Current Outpatient Medications:  ?  acetaminophen (TYLENOL) 500 MG tablet, Take 500 mg by mouth every 6 (six) hours as needed (for pain.)., Disp: , Rfl:  ?  albuterol (VENTOLIN HFA) 108 (90 Base) MCG/ACT inhaler, Inhale 2 puffs into the lungs every 6 (six) hours as needed for wheezing or shortness of breath. , Disp: , Rfl:  ?  amiodarone (PACERONE) 100 MG tablet, Take 1 tablet (100 mg total) by mouth daily., Disp: 90 tablet, Rfl: 0 ?  aspirin EC 81 MG tablet, Take 81 mg by mouth daily. , Disp: , Rfl:  ?  citalopram (CELEXA) 40 MG tablet, Take 0.5 tablets (20 mg total) by mouth 2 (two) times daily., Disp: 90 tablet, Rfl: 0 ?  clonazePAM (KLONOPIN) 0.5 MG tablet, Take 1.5 tablets (0.75 mg total) by mouth at bedtime. Maintain upcoming appt for additional refills, Disp: 45 tablet, Rfl: 0 ?  diltiazem (CARDIZEM CD) 180 MG 24 hr capsule, TAKE ONE CAPSULE BY MOUTH DAILY, Disp: 90 capsule, Rfl: 2 ?  furosemide (LASIX) 20 MG tablet, TAKE 1 TABLET BY MOUTH DAILY, Disp: 90 tablet, Rfl:  2 ?  isosorbide mononitrate (IMDUR) 60 MG 24 hr tablet, Take 1 tablet (60 mg total) by mouth daily., Disp: 90 tablet, Rfl: 3 ?  Lancets (ONETOUCH DELICA PLUS [id number]) MISC, TAKE AS DIRECTED ONCE DAILY., Disp: , Rfl:  ?  levalbuterol (XOPENEX HFA) 45 MCG/ACT inhaler, Inhale 1-2 puffs into the lungs every 6 (six) hours as needed for shortness of breath., Disp: 1 Inhaler, Rfl: 12 ?  losartan (COZAAR) 50 MG tablet, Take 1 tablet (50 mg total) by mouth daily., Disp: 90 tablet, Rfl: 0 ?  metFORMIN (GLUCOPHAGE) 500 MG tablet, Take 500 mg by mouth 2 (two) times daily., Disp: , Rfl:  ?  nitroGLYCERIN (NITROSTAT) 0.4 MG SL  tablet, Place 1 tablet (0.4 mg total) under the tongue every 5 (five) minutes x 3 doses as needed for chest pain., Disp: 25 tablet, Rfl: 3 ?  ONETOUCH VERIO test strip, AS DIRECTED ONCE A DAY, Disp: , Rfl:  ?  pantoprazole (PROTONIX) 40 MG tablet, Take 1 tablet (40 mg total) by mouth daily., Disp: 90 tablet, Rfl: 0 ?  rosuvastatin (CRESTOR) 40 MG tablet, Take 1 tablet (40 mg total) by mouth daily., Disp: 90 tablet, Rfl: 1 ?  spironolactone (ALDACTONE) 50 MG tablet, TAKE 1 TABLET(50 MG) BY MOUTH DAILY, Disp: 30 tablet, Rfl: 6 ?  STIOLTO RESPIMAT 2.5-2.5 MCG/ACT AERS, INHALE 2 PUFFS INTO THE LUNGS DAILY, Disp: 4 g, Rfl: 5 ?  warfarin (COUMADIN) 5 MG tablet, Take 1 tablet (5 mg total) by mouth daily. Refer to most recent anticoagulation note for most accurate information. (Patient taking differently: Take 5 mg by mouth daily. or as directed by coumadin clinic  Currently on: 5 mg every Wed; 7.5 mg all other days), Disp: 90 tablet, Rfl: 2 ?  warfarin (COUMADIN) 7.5 MG tablet, TAKE 1 TABLET BY MOUTH EVERY EVENING (Patient taking differently: 5 mg every Wed; 7.5 mg all other days or as directed by coumadin clinic), Disp: 90 tablet, Rfl: 2 ?Past Medical History:  ?Diagnosis Date  ? COVID-19   ? Hyperlipidemia   ? Hypertension   ? Rheumatic heart disease   ? MS/ AS  ? ? ?ASSESSMENT ? ?Recent Results: ?The most recent result is correlated with 47.5 mg per week: ? ?Lab Results  ?Component Value Date  ? INR 3.0 05/07/2021  ? INR 2.9 04/30/2021  ? INR 2.5 04/23/2021  ? ? ?Anticoagulation Dosing: ?Description   ?INR at goal. Continue current weekly dose of 5 mg every Thurs, Sat and 7.5 mg all other days. Recheck INR in 1 week ?  ?  ?INR today: Therapeutic. INR continues to remain therapeutic on current weekly dose Pt notes stable Vit K intake. Denies any unusual bleeding or bruising symptoms. Denies any other relevant changes in diet, medications, or lifestyle. Amiodarone dose stable at 100 mg. Will continue current weekly dose  and continue remote monitoring.  ? ?PLAN ?Weekly dose was unchanged by 0 % to 47.5 mg/week. Continue current weekly dose of 5 mg every Thurs, Sat and 7.5 mg all other days. Recheck INR in 1 week.  ? ?Patient Instructions  ?INR at goal. Continue current weekly dose of 5 mg every Thurs, Sat and 7.5 mg all other days. Recheck INR in 1 week ?Patient advised to contact clinic or seek medical attention if signs/symptoms of bleeding or thromboembolism occur. ? ?Patient verbalized understanding by repeating back information and was advised to contact me if further medication-related questions arise.  ? ?Follow-up ?Return in about 1 week (around 05/14/2021). ? ?  [name], PharmD ? ?15 minutes spent face-to-face with the patient during the encounter. 50% of time spent on education, including signs/sx bleeding and clotting, as well as food and drug interactions with warfarin. 50% of time was spent on fingerprick POC INR sample collection,processing, results determination, and documentation ?

## 2021-05-10 ENCOUNTER — Other Ambulatory Visit (HOSPITAL_COMMUNITY)
Admission: RE | Admit: 2021-05-10 | Discharge: 2021-05-10 | Disposition: A | Discharge status: Home / Self Care | Payer: Medicare Other | Source: Ambulatory Visit | Attending: Nurse Practitioner | Admitting: Nurse Practitioner

## 2021-05-10 ENCOUNTER — Encounter: Payer: Self-pay | Admitting: Nurse Practitioner

## 2021-05-10 ENCOUNTER — Ambulatory Visit (INDEPENDENT_AMBULATORY_CARE_PROVIDER_SITE_OTHER): Payer: Medicare Other | Admitting: Nurse Practitioner

## 2021-05-10 VITALS — BP 116/64 | HR 86 | Temp 97.6°F | Resp 20 | Ht 65.0 in | Wt 215.4 lb

## 2021-05-10 DIAGNOSIS — N183 Chronic kidney disease, stage 3 unspecified: Secondary | ICD-10-CM

## 2021-05-10 DIAGNOSIS — I10 Essential (primary) hypertension: Secondary | ICD-10-CM

## 2021-05-10 DIAGNOSIS — Z1231 Encounter for screening mammogram for malignant neoplasm of breast: Secondary | ICD-10-CM | POA: Diagnosis not present

## 2021-05-10 DIAGNOSIS — Z1151 Encounter for screening for human papillomavirus (HPV): Secondary | ICD-10-CM | POA: Diagnosis not present

## 2021-05-10 DIAGNOSIS — Z124 Encounter for screening for malignant neoplasm of cervix: Secondary | ICD-10-CM | POA: Diagnosis not present

## 2021-05-10 DIAGNOSIS — E1165 Type 2 diabetes mellitus with hyperglycemia: Secondary | ICD-10-CM | POA: Diagnosis not present

## 2021-05-10 DIAGNOSIS — Z0001 Encounter for general adult medical examination with abnormal findings: Secondary | ICD-10-CM

## 2021-05-10 DIAGNOSIS — F411 Generalized anxiety disorder: Secondary | ICD-10-CM

## 2021-05-10 DIAGNOSIS — R7989 Other specified abnormal findings of blood chemistry: Secondary | ICD-10-CM

## 2021-05-10 DIAGNOSIS — E1122 Type 2 diabetes mellitus with diabetic chronic kidney disease: Secondary | ICD-10-CM | POA: Diagnosis not present

## 2021-05-10 DIAGNOSIS — Z01419 Encounter for gynecological examination (general) (routine) without abnormal findings: Secondary | ICD-10-CM | POA: Diagnosis present

## 2021-05-10 LAB — HEMOGLOBIN A1C: Hgb A1c MFr Bld: 6.4 % (ref 4.6–6.5)

## 2021-05-10 LAB — LIPID PANEL
Cholesterol: 116 mg/dL (ref 0–200)
HDL: 45.5 mg/dL (ref >39.00)
LDL Cholesterol: 48 mg/dL (ref 0–99)
NonHDL: 70.8
Total CHOL/HDL Ratio: 3
Triglycerides: 115 mg/dL (ref 0.0–149.0)
VLDL: 23 mg/dL (ref 0.0–40.0)

## 2021-05-10 LAB — MICROALBUMIN / CREATININE URINE RATIO
Creatinine,U: 143.6 mg/dL
Microalb Creat Ratio: 23.9 mg/g (ref 0.0–30.0)
Microalb, Ur: 34.3 mg/dL — ABNORMAL HIGH (ref 0.0–1.9)

## 2021-05-10 LAB — CBC
HCT: 35.8 % — ABNORMAL LOW (ref 36.0–46.0)
Hemoglobin: 11.6 g/dL — ABNORMAL LOW (ref 12.0–15.0)
MCHC: 32.5 g/dL (ref 30.0–36.0)
MCV: 79.9 fl (ref 78.0–100.0)
Platelets: 221 10*3/uL (ref 150.0–400.0)
RBC: 4.48 Mil/uL (ref 3.87–5.11)
RDW: 16.8 % — ABNORMAL HIGH (ref 11.5–15.5)
WBC: 6.6 10*3/uL (ref 4.0–10.5)

## 2021-05-10 LAB — COMPREHENSIVE METABOLIC PANEL
ALT: 57 U/L — ABNORMAL HIGH (ref 0–35)
AST: 40 U/L — ABNORMAL HIGH (ref 0–37)
Albumin: 4.7 g/dL (ref 3.5–5.2)
Alkaline Phosphatase: 90 U/L (ref 39–117)
BUN: 21 mg/dL (ref 6–23)
CO2: 26 mEq/L (ref 19–32)
Calcium: 9.6 mg/dL (ref 8.4–10.5)
Chloride: 101 mEq/L (ref 96–112)
Creatinine, Ser: 1.41 mg/dL — ABNORMAL HIGH (ref 0.40–1.20)
GFR: 40.77 mL/min — ABNORMAL LOW (ref >60.00)
Glucose, Bld: 96 mg/dL (ref 70–99)
Potassium: 4.5 mEq/L (ref 3.5–5.1)
Sodium: 134 mEq/L — ABNORMAL LOW (ref 135–145)
Total Bilirubin: 0.4 mg/dL (ref 0.2–1.2)
Total Protein: 7.6 g/dL (ref 6.0–8.3)

## 2021-05-10 MED ORDER — CITALOPRAM HYDROBROMIDE 40 MG PO TABS: 20.0000 mg | ORAL_TABLET | Freq: Two times a day (BID) | ORAL | 3 refills | Status: DC

## 2021-05-10 NOTE — Patient Instructions (Addendum)
Sign medical release to get records from ophthalmology ? ?You will be contacted to schedule appt for mammogram. ? ?Get zoster vaccine at the retail pharmacy ? ?Go to lab for blood draw and urine collection. ?

## 2021-05-10 NOTE — Assessment & Plan Note (Signed)
No improvement, no SI/HI. ?Had appt with Dr. Shea Evans (psychiatry): declined additional medication. ?Has upcoming appt with psychology ?Current use of citalopram '40mg'$  and klonopin 0.'5mg'$  ? ?Maintain med dose and upcoming appt with psychology ?

## 2021-05-10 NOTE — Assessment & Plan Note (Signed)
No glucose check at home ?Current use of metformin and crestor with no adverse side effects. ?Annual eye exam completed: obtain report. ? ?Repeat CMP, HgbA1c, urine microalbumin and lipid panel ?mainatin med dose ?F/up in 3-55month ?

## 2021-05-10 NOTE — Progress Notes (Signed)
? ?Complete physical exam ? ?Patient: Kimberly Hobbs   DOB: [birth year]   61 y.o. Female  MRN: [id number] ?Visit Date: 05/10/2021 ? ?Subjective:  ?  ?Chief Complaint  ?Patient presents with  ? Annual Exam  ?  Physical-Breast and pap exam needed.  ?Pt is fasting.   ? ?Kimberly Hobbs is a 61 y.o. female who presents today for a complete physical exam. She reports consuming a general diet.  none  She generally feels well. She reports sleeping fairly well. She does not have additional problems to discuss today.  ?Vision:Within the last year ?Dental:Within Last 6 months ? ?BP Readings from Last 3 Encounters:  ?05/10/21 116/64  ?04/21/21 (!) 106/57  ?04/13/21 137/70  ?  ?Wt Readings from Last 3 Encounters:  ?05/10/21 215 lb 6.4 oz (97.7 kg)  ?04/13/21 218 lb 9.6 oz (99.2 kg)  ?03/28/21 219 lb (99.3 kg)  ?  ?Most recent fall risk assessment: ? ?  02/01/2021  ? 10:37 AM  ?Fall Risk   ?Falls in the past year? 0  ?Number falls in past yr: 0  ?Injury with Fall? 0  ?Risk for fall due to : No Fall Risks  ?Follow up Falls evaluation completed  ? ?Most recent depression screenings: ? ?  05/10/2021  ?  9:38 AM 04/13/2021  ? 10:06 AM  ?PHQ 2/9 Scores  ?PHQ - 2 Score 3 6  ?PHQ- 9 Score 13 18  ? ? ?  05/10/2021  ?  9:38 AM 04/13/2021  ? 10:06 AM 02/01/2021  ? 10:47 AM  ?Depression screen PHQ 2/9  ?Decreased Interest '1 3 1  '$ ?Down, Depressed, Hopeless '2 3 1  '$ ?PHQ - 2 Score '3 6 2  '$ ?Altered sleeping '2 3 1  '$ ?Tired, decreased energy '2 3 1  '$ ?Change in appetite '2 3 1  '$ ?Feeling bad or failure about yourself  '2 3 1  '$ ?Trouble concentrating 2 0 0  ?Moving slowly or fidgety/restless 0 0 0  ?Suicidal thoughts 0 0 0  ?PHQ-9 Score '13 18 6  '$ ?Difficult doing work/chores Somewhat difficult Very difficult   ?  ? ?  05/10/2021  ?  9:38 AM 04/13/2021  ?  1:00 PM 02/01/2021  ? 10:47 AM  ?GAD 7 : Generalized Anxiety Score  ?Nervous, Anxious, on Edge 0 3 1  ?Control/stop worrying '3 3 2  '$ ?Worry too much - different things '3 3 3  '$ ?Trouble relaxing '3 3 1   '$ ?Restless 1 3 0  ?Easily annoyed or irritable 0 1 0  ?Afraid - awful might happen 3 3 0  ?Total GAD 7 Score '13 19 7  '$ ?Anxiety Difficulty Somewhat difficult Somewhat difficult   ? ?HPI  ?GAD (generalized anxiety disorder) ?No improvement, no SI/HI. ?Had appt with Dr. Shea Evans (psychiatry): declined additional medication. ?Has upcoming appt with psychology ?Current use of citalopram '40mg'$  and klonopin 0.'5mg'$  ? ?Maintain med dose and upcoming appt with psychology ? ?Type 2 diabetes mellitus with hyperglycemia, without long-term current use of insulin ([place]) ?No glucose check at home ?Current use of metformin and crestor with no adverse side effects. ?Annual eye exam completed: obtain report. ? ?Repeat CMP, HgbA1c, urine microalbumin and lipid panel ?mainatin med dose ?F/up in 3-20month ? ? ?Past Medical History:  ?Diagnosis Date  ? COVID-19   ? Hyperlipidemia   ? Hypertension   ? Rheumatic heart disease   ? MS/ AS  ? ?Past Surgical History:  ?Procedure Laterality Date  ? CARDIAC CATHETERIZATION    ? CHOLECYSTECTOMY    ?  CORONARY/GRAFT ANGIOGRAPHY N/A 08/18/2018  ? Procedure: CORONARY/GRAFT ANGIOGRAPHY;  Surgeon: Nigel Mormon, MD;  Location: [facility];  Service: Cardiovascular;  Laterality: N/A;  ? LEG SURGERY    ? "metal plate in leg"  ? RIGHT HEART CATH AND CORONARY/GRAFT ANGIOGRAPHY N/A 07/21/2018  ? Procedure: RIGHT HEART CATH AND CORONARY/GRAFT ANGIOGRAPHY;  Surgeon: Nigel Mormon, MD;  Location: [facility];  Service: Cardiovascular;  Laterality: N/A;  ? TEE WITHOUT CARDIOVERSION N/A 07/27/2015  ? Procedure: TRANSESOPHAGEAL ECHOCARDIOGRAM (TEE);  Surgeon: Sanda Klein, MD;  Location: [facility];  Service: Cardiovascular;  Laterality: N/A;  ? TEE WITHOUT CARDIOVERSION N/A 07/21/2018  ? Procedure: TRANSESOPHAGEAL ECHOCARDIOGRAM (TEE);  Surgeon: Nigel Mormon, MD;  Location: [facility] [place];  Service: Cardiovascular;  Laterality: N/A;  ? TONSILLECTOMY AND ADENOIDECTOMY    ? ?Social History   ? ?Socioeconomic History  ? Marital status: Divorced  ?  Spouse name: Not on file  ? Number of children: 4  ? Years of education: Not on file  ? Highest education level: GED or equivalent  ?Occupational History  ? Not on file  ?Tobacco Use  ? Smoking status: Former  ?  Packs/day: 1.50  ?  Years: 40.00  ?  Pack years: 60.00  ?  Types: Cigarettes  ?  Quit date: 12/23/2016  ?  Years since quitting: 4.3  ? Smokeless tobacco: Never  ?Vaping Use  ? Vaping Use: Never used  ?Substance and Sexual Activity  ? Alcohol use: Not Currently  ? Drug use: No  ? Sexual activity: Not Currently  ?Other Topics Concern  ? Not on file  ?Social History Narrative  ? ** Merged History Encounter **  ?    ? Epworth Sleepiness Scale = 4 (as of 06/28/2015)  ? ?Social Determinants of Health  ? ?Financial Resource Strain: Not on file  ?Food Insecurity: Not on file  ?Transportation Needs: Not on file  ?Physical Activity: Not on file  ?Stress: Not on file  ?Social Connections: Not on file  ?Intimate Partner Violence: Not on file  ? ?Family Status  ?Relation Name Status  ? Mother  Alive  ? Father  Deceased at age 5  ? Sister  Alive  ? Brother  Alive  ? Brother  Alive  ? MGF  Deceased  ? MGM  Deceased  ? PGM  Deceased  ? Child  Alive  ? Child  Alive  ? Child  Alive  ? Child  Alive  ? ?Family History  ?Problem Relation Age of Onset  ? Cancer Mother   ? Hypertension Mother   ? Diabetes Mother   ? Hyperlipidemia Mother   ? Heart disease Mother   ? Cancer Father   ? Hypertension Father   ? Diabetes Father   ? Heart disease Father   ? Hypertension Sister   ? Hypertension Brother   ? Hypertension Brother   ? Schizophrenia Maternal Grandmother   ? ?Allergies  ?Allergen Reactions  ? Iodinated Contrast Media Other (See Comments)  ?  Patient is unsure of reaction type  ? Penicillins Other (See Comments)  ?  Immune to drug , does not work per patient ? ?Did it involve swelling of the face/tongue/throat, SOB, or low BP? No ?Did it involve sudden or severe  rash/hives, skin peeling, or any reaction on the inside of your mouth or nose? No ?Did you need to seek medical attention at a hospital or doctor's office? No ?When did it last happen? NOT A  TRUE ALLERGY   ?If all above answers are ?NO?, may proceed with cephalosporin use. ?  ?  ?Patient Care Team: ?Ayaz Sondgeroth, Charlene Brooke, NP as PCP - General (Internal Medicine)  ? ?Medications: ?Outpatient Medications Prior to Visit  ?Medication Sig  ? acetaminophen (TYLENOL) 500 MG tablet Take 500 mg by mouth every 6 (six) hours as needed (for pain.).  ? albuterol (VENTOLIN HFA) 108 (90 Base) MCG/ACT inhaler Inhale 2 puffs into the lungs every 6 (six) hours as needed for wheezing or shortness of breath.   ? amiodarone (PACERONE) 100 MG tablet Take 1 tablet (100 mg total) by mouth daily.  ? aspirin EC 81 MG tablet Take 81 mg by mouth daily.   ? clonazePAM (KLONOPIN) 0.5 MG tablet Take 1.5 tablets (0.75 mg total) by mouth at bedtime. Maintain upcoming appt for additional refills  ? diltiazem (CARDIZEM CD) 180 MG 24 hr capsule TAKE ONE CAPSULE BY MOUTH DAILY  ? furosemide (LASIX) 20 MG tablet TAKE 1 TABLET BY MOUTH DAILY  ? isosorbide mononitrate (IMDUR) 60 MG 24 hr tablet Take 1 tablet (60 mg total) by mouth daily.  ? Lancets (ONETOUCH DELICA PLUS [id number]) MISC TAKE AS DIRECTED ONCE DAILY.  ? levalbuterol (XOPENEX HFA) 45 MCG/ACT inhaler Inhale 1-2 puffs into the lungs every 6 (six) hours as needed for shortness of breath.  ? losartan (COZAAR) 50 MG tablet Take 1 tablet (50 mg total) by mouth daily.  ? metFORMIN (GLUCOPHAGE) 500 MG tablet Take 500 mg by mouth 2 (two) times daily.  ? nitroGLYCERIN (NITROSTAT) 0.4 MG SL tablet Place 1 tablet (0.4 mg total) under the tongue every 5 (five) minutes x 3 doses as needed for chest pain.  ? pantoprazole (PROTONIX) 40 MG tablet Take 1 tablet (40 mg total) by mouth daily.  ? rosuvastatin (CRESTOR) 40 MG tablet Take 1 tablet (40 mg total) by mouth daily.  ? spironolactone (ALDACTONE) 50 MG  tablet TAKE 1 TABLET(50 MG) BY MOUTH DAILY  ? STIOLTO RESPIMAT 2.5-2.5 MCG/ACT AERS INHALE 2 PUFFS INTO THE LUNGS DAILY  ? warfarin (COUMADIN) 5 MG tablet Take 1 tablet (5 mg total) by mouth daily. Refer to most recent a

## 2021-05-12 ENCOUNTER — Encounter: Payer: Self-pay | Admitting: Nurse Practitioner

## 2021-05-14 LAB — POCT INR: INR: 3.1 — AB (ref 2.0–3.0)

## 2021-05-15 ENCOUNTER — Encounter: Payer: Self-pay | Admitting: Nurse Practitioner

## 2021-05-15 DIAGNOSIS — N183 Chronic kidney disease, stage 3 unspecified: Secondary | ICD-10-CM | POA: Insufficient documentation

## 2021-05-15 DIAGNOSIS — E1122 Type 2 diabetes mellitus with diabetic chronic kidney disease: Secondary | ICD-10-CM

## 2021-05-15 HISTORY — DX: Chronic kidney disease, stage 3 unspecified: N18.30

## 2021-05-15 HISTORY — DX: Type 2 diabetes mellitus with diabetic chronic kidney disease: E11.22

## 2021-05-15 LAB — CYTOLOGY - PAP
Comment: NEGATIVE
Diagnosis: NEGATIVE
High risk HPV: NEGATIVE

## 2021-05-15 MED ORDER — METFORMIN HCL 500 MG PO TABS: 500.0000 mg | ORAL_TABLET | Freq: Two times a day (BID) | ORAL | 3 refills | Status: DC

## 2021-05-15 MED ORDER — LOSARTAN POTASSIUM 50 MG PO TABS: 50.0000 mg | ORAL_TABLET | Freq: Every day | ORAL | 3 refills | Status: DC

## 2021-05-15 NOTE — Addendum Note (Signed)
Addended by: Leana Gamer on: 05/15/2021 09:38 AM ? ? Modules accepted: Orders ? ?

## 2021-05-16 ENCOUNTER — Ambulatory Visit: Payer: Self-pay | Admitting: Pharmacist

## 2021-05-16 DIAGNOSIS — Z7901 Long term (current) use of anticoagulants: Secondary | ICD-10-CM | POA: Diagnosis not present

## 2021-05-16 DIAGNOSIS — Z5181 Encounter for therapeutic drug level monitoring: Secondary | ICD-10-CM | POA: Diagnosis not present

## 2021-05-16 DIAGNOSIS — Z952 Presence of prosthetic heart valve: Secondary | ICD-10-CM | POA: Diagnosis not present

## 2021-05-16 NOTE — Progress Notes (Signed)
Anticoagulation Management ?Kimberly Hobbs is a 61 y.o. female who reports to the clinic for monitoring of warfarin treatment.   ? ?Indication: atrial fibrillation and Hx of Mechanical mitral and aortic valve replacement  ; CHA2DS2 Vasc Score 4 (Female, HTN, DM, Vascular disease hx), HAS-BLED 1 (ASA, prior bleed hx (hematuria, hematochezia) ? ?Duration: indefinite ?Supervising physician: Vernell Leep ? ?Anticoagulation Clinic Visit History: ? ?Patient does not report signs/symptoms of bleeding or thromboembolism  ? ?Other recent changes: No changes in diet, medications, lifestyle.  ? ?Pt maintaining steady Vit-K intake of spinach salad 3-4/week. ? ?Stable on amiodarone 100 mg daily.  ? ?Pt reports that her calf pain concerns have improved. Recent labs completed through PCP office that showed mildly elevated liver enzymes. Pt scheduled for follow up for repeat labs in 1 month.  ? ?Anticoagulation Episode Summary   ? ? Current INR goal:  2.5-3.5  ?TTR:  60.6 % (3.1 y)  ?Next INR check:  05/23/2021  ?INR from last check:  3.1 (05/14/2021)  ?Weekly max warfarin dose:    ?Target end date:  Indefinite  ?INR check location:    ?Preferred lab:    ?Send INR reminders to:    ? Indications   ?Status post mechanical aortic valve replacement [Z95.2] ?Monitoring for long-term anticoagulant use [Z51.81 ?Z79.01] ? ?  ?  ? ? Comments:    ?  ? ?  ? ? ?Allergies  ?Allergen Reactions  ? Iodinated Contrast Media Other (See Comments)  ?  Patient is unsure of reaction type  ? Penicillins Other (See Comments)  ?  Immune to drug , does not work per patient ? ?Did it involve swelling of the face/tongue/throat, SOB, or low BP? No ?Did it involve sudden or severe rash/hives, skin peeling, or any reaction on the inside of your mouth or nose? No ?Did you need to seek medical attention at a hospital or doctor's office? No ?When did it last happen? NOT A TRUE ALLERGY   ?If all above answers are ?NO?, may proceed with cephalosporin use. ?   ? ? ?Current Outpatient Medications:  ?  acetaminophen (TYLENOL) 500 MG tablet, Take 500 mg by mouth every 6 (six) hours as needed (for pain.)., Disp: , Rfl:  ?  albuterol (VENTOLIN HFA) 108 (90 Base) MCG/ACT inhaler, Inhale 2 puffs into the lungs every 6 (six) hours as needed for wheezing or shortness of breath. , Disp: , Rfl:  ?  amiodarone (PACERONE) 100 MG tablet, Take 1 tablet (100 mg total) by mouth daily., Disp: 90 tablet, Rfl: 0 ?  aspirin EC 81 MG tablet, Take 81 mg by mouth daily. , Disp: , Rfl:  ?  citalopram (CELEXA) 40 MG tablet, Take 0.5 tablets (20 mg total) by mouth 2 (two) times daily., Disp: 90 tablet, Rfl: 3 ?  clonazePAM (KLONOPIN) 0.5 MG tablet, Take 1.5 tablets (0.75 mg total) by mouth at bedtime. Maintain upcoming appt for additional refills, Disp: 45 tablet, Rfl: 0 ?  diltiazem (CARDIZEM CD) 180 MG 24 hr capsule, TAKE ONE CAPSULE BY MOUTH DAILY, Disp: 90 capsule, Rfl: 2 ?  furosemide (LASIX) 20 MG tablet, TAKE 1 TABLET BY MOUTH DAILY, Disp: 90 tablet, Rfl: 2 ?  isosorbide mononitrate (IMDUR) 60 MG 24 hr tablet, Take 1 tablet (60 mg total) by mouth daily., Disp: 90 tablet, Rfl: 3 ?  Lancets (ONETOUCH DELICA PLUS [id number]) MISC, TAKE AS DIRECTED ONCE DAILY., Disp: , Rfl:  ?  levalbuterol (XOPENEX HFA) 45 MCG/ACT inhaler, Inhale 1-2 puffs  into the lungs every 6 (six) hours as needed for shortness of breath., Disp: 1 Inhaler, Rfl: 12 ?  losartan (COZAAR) 50 MG tablet, Take 1 tablet (50 mg total) by mouth daily., Disp: 90 tablet, Rfl: 3 ?  metFORMIN (GLUCOPHAGE) 500 MG tablet, Take 1 tablet (500 mg total) by mouth 2 (two) times daily with a meal., Disp: 180 tablet, Rfl: 3 ?  nitroGLYCERIN (NITROSTAT) 0.4 MG SL tablet, Place 1 tablet (0.4 mg total) under the tongue every 5 (five) minutes x 3 doses as needed for chest pain., Disp: 25 tablet, Rfl: 3 ?  ONETOUCH VERIO test strip, AS DIRECTED ONCE A DAY, Disp: , Rfl:  ?  pantoprazole (PROTONIX) 40 MG tablet, Take 1 tablet (40 mg total) by mouth  daily., Disp: 90 tablet, Rfl: 0 ?  rosuvastatin (CRESTOR) 40 MG tablet, Take 1 tablet (40 mg total) by mouth daily., Disp: 90 tablet, Rfl: 1 ?  spironolactone (ALDACTONE) 50 MG tablet, TAKE 1 TABLET(50 MG) BY MOUTH DAILY, Disp: 30 tablet, Rfl: 6 ?  STIOLTO RESPIMAT 2.5-2.5 MCG/ACT AERS, INHALE 2 PUFFS INTO THE LUNGS DAILY, Disp: 4 g, Rfl: 5 ?  warfarin (COUMADIN) 5 MG tablet, Take 1 tablet (5 mg total) by mouth daily. Refer to most recent anticoagulation note for most accurate information. (Patient taking differently: Take 5 mg by mouth daily. or as directed by coumadin clinic  Currently on: 5 mg every Wed; 7.5 mg all other days), Disp: 90 tablet, Rfl: 2 ?  warfarin (COUMADIN) 7.5 MG tablet, TAKE 1 TABLET BY MOUTH EVERY EVENING, Disp: 90 tablet, Rfl: 2 ?Past Medical History:  ?Diagnosis Date  ? CKD stage 3 due to type 2 diabetes mellitus ([place]) 05/15/2021  ? COVID-19   ? Hyperlipidemia   ? Hypertension   ? Rheumatic heart disease   ? MS/ AS  ? ? ?ASSESSMENT ? ?Recent Results: ?The most recent result is correlated with 47.5 mg per week: ? ?Lab Results  ?Component Value Date  ? INR 3.1 (A) 05/14/2021  ? INR 3.0 05/07/2021  ? INR 2.9 04/30/2021  ? ? ?Anticoagulation Dosing: ?Description   ?INR at goal. Continue current weekly dose of 5 mg every Thurs, Sat and 7.5 mg all other days. Recheck INR in 1 week ?  ?  ?INR today: Therapeutic. INR continues to remain therapeutic on current weekly dose Pt notes stable Vit K intake. Denies any unusual bleeding or bruising symptoms. Denies any other relevant changes in diet, medications, or lifestyle. Amiodarone dose stable at 100 mg. Will continue current weekly dose and continue remote monitoring.  ? ?PLAN ?Weekly dose was unchanged by 0 % to 47.5 mg/week. Continue current weekly dose of 5 mg every Thurs, Sat and 7.5 mg all other days. Recheck INR in 1 week.  ? ?Patient Instructions  ?INR at goal. Continue current weekly dose of 5 mg every Thurs, Sat and 7.5 mg all other days.  Recheck INR in 1 week ?Patient advised to contact clinic or seek medical attention if signs/symptoms of bleeding or thromboembolism occur. ? ?Patient verbalized understanding by repeating back information and was advised to contact me if further medication-related questions arise.  ? ?Follow-up ?Return in about 1 week (around 05/23/2021). ? ?[name], PharmD ? ?15 minutes spent face-to-face with the patient during the encounter. 50% of time spent on education, including signs/sx bleeding and clotting, as well as food and drug interactions with warfarin. 50% of time was spent on fingerprick POC INR sample collection,processing, results determination, and  documentation ?

## 2021-05-16 NOTE — Patient Instructions (Signed)
INR at goal. Continue current weekly dose of 5 mg every Thurs, Sat and 7.5 mg all other days. Recheck INR in 1 week ?

## 2021-05-17 NOTE — Telephone Encounter (Signed)
From pt

## 2021-05-18 ENCOUNTER — Encounter: Payer: Self-pay | Admitting: Nurse Practitioner

## 2021-05-18 ENCOUNTER — Ambulatory Visit (INDEPENDENT_AMBULATORY_CARE_PROVIDER_SITE_OTHER): Payer: Medicare Other | Admitting: Nurse Practitioner

## 2021-05-18 VITALS — BP 130/76 | HR 69 | Temp 97.2°F | Wt 213.0 lb

## 2021-05-18 DIAGNOSIS — K649 Unspecified hemorrhoids: Secondary | ICD-10-CM | POA: Diagnosis not present

## 2021-05-18 MED ORDER — HYDROCORTISONE ACETATE 25 MG RE SUPP: 25.0000 mg | Freq: Two times a day (BID) | RECTAL | 0 refills | Status: DC

## 2021-05-18 NOTE — Patient Instructions (Signed)
It was great to see you! ? ?Start annusol suppository twice a day. Drink plenty of water and start miralax daily as needed for constipation.  ? ?Let's follow-up if your symptoms worsen or don't improve.  ? ?Take care, ? ?Vance Peper, NP ? ?

## 2021-05-18 NOTE — Progress Notes (Signed)
? ?Acute Office Visit ? ?Subjective:  ? ? Patient ID: Kimberly Hobbs, female    DOB: 03-15-1960, 61 y.o.   MRN: [id number] ? ?Chief Complaint  ?Patient presents with  ? Rectal Bleeding  ?  Pt c/o blood in stool x1 day w/ mild constipation   ? ? ?HPI ?Patient is in today for blood in her stool for 2 bowel movements yesterday and this morning. She noticed bright red blood in the toilet and on the toilet paper. She is on coumadin. She also endorses mild constipation along with suprapubic abdominal pain that she describes as gas sensation. Denies nausea and vomiting, rectal itching and pain.  She has not tried anything over-the-counter for her symptoms. ? ?Past Medical History:  ?Diagnosis Date  ? CKD stage 3 due to type 2 diabetes mellitus ([place]) 05/15/2021  ? COVID-19   ? Hyperlipidemia   ? Hypertension   ? Rheumatic heart disease   ? MS/ AS  ? ? ?Past Surgical History:  ?Procedure Laterality Date  ? CARDIAC CATHETERIZATION    ? CHOLECYSTECTOMY    ? CORONARY/GRAFT ANGIOGRAPHY N/A 08/18/2018  ? Procedure: CORONARY/GRAFT ANGIOGRAPHY;  Surgeon: Nigel Mormon, MD;  Location: [facility];  Service: Cardiovascular;  Laterality: N/A;  ? LEG SURGERY    ? "metal plate in leg"  ? RIGHT HEART CATH AND CORONARY/GRAFT ANGIOGRAPHY N/A 07/21/2018  ? Procedure: RIGHT HEART CATH AND CORONARY/GRAFT ANGIOGRAPHY;  Surgeon: Nigel Mormon, MD;  Location: [facility];  Service: Cardiovascular;  Laterality: N/A;  ? TEE WITHOUT CARDIOVERSION N/A 07/27/2015  ? Procedure: TRANSESOPHAGEAL ECHOCARDIOGRAM (TEE);  Surgeon: Sanda Klein, MD;  Location: [place];  Service: Cardiovascular;  Laterality: N/A;  ? TEE WITHOUT CARDIOVERSION N/A 07/21/2018  ? Procedure: TRANSESOPHAGEAL ECHOCARDIOGRAM (TEE);  Surgeon: Nigel Mormon, MD;  Location: [place];  Service: Cardiovascular;  Laterality: N/A;  ? TONSILLECTOMY AND ADENOIDECTOMY    ? ? ?Family History  ?Problem Relation Age of Onset  ? Cancer Mother   ?  Hypertension Mother   ? Diabetes Mother   ? Hyperlipidemia Mother   ? Heart disease Mother   ? Cancer Father   ? Hypertension Father   ? Diabetes Father   ? Heart disease Father   ? Hypertension Sister   ? Hypertension Brother   ? Hypertension Brother   ? Schizophrenia Maternal Grandmother   ? ? ?Social History  ? ?Socioeconomic History  ? Marital status: Divorced  ?  Spouse name: Not on file  ? Number of children: 4  ? Years of education: Not on file  ? Highest education level: GED or equivalent  ?Occupational History  ? Not on file  ?Tobacco Use  ? Smoking status: Former  ?  Packs/day: 1.50  ?  Years: 40.00  ?  Pack years: 60.00  ?  Types: Cigarettes  ?  Quit date: 12/23/2016  ?  Years since quitting: 4.4  ? Smokeless tobacco: Never  ?Vaping Use  ? Vaping Use: Never used  ?Substance and Sexual Activity  ? Alcohol use: Not Currently  ? Drug use: No  ? Sexual activity: Not Currently  ?Other Topics Concern  ? Not on file  ?Social History Narrative  ? ** Merged History Encounter **  ?    ? Epworth Sleepiness Scale = 4 (as of 06/28/2015)  ? ?Social Determinants of Health  ? ?Financial Resource Strain: Not on file  ?Food Insecurity: Not on file  ?Transportation Needs: Not on file  ?Physical  Activity: Not on file  ?Stress: Not on file  ?Social Connections: Not on file  ?Intimate Partner Violence: Not on file  ? ? ?Outpatient Medications Prior to Visit  ?Medication Sig Dispense Refill  ? acetaminophen (TYLENOL) 500 MG tablet Take 500 mg by mouth every 6 (six) hours as needed (for pain.).    ? albuterol (VENTOLIN HFA) 108 (90 Base) MCG/ACT inhaler Inhale 2 puffs into the lungs every 6 (six) hours as needed for wheezing or shortness of breath.     ? amiodarone (PACERONE) 100 MG tablet Take 1 tablet (100 mg total) by mouth daily. 90 tablet 0  ? aspirin EC 81 MG tablet Take 81 mg by mouth daily.     ? citalopram (CELEXA) 40 MG tablet Take 0.5 tablets (20 mg total) by mouth 2 (two) times daily. 90 tablet 3  ? clonazePAM  (KLONOPIN) 0.5 MG tablet Take 1.5 tablets (0.75 mg total) by mouth at bedtime. Maintain upcoming appt for additional refills 45 tablet 0  ? diltiazem (CARDIZEM CD) 180 MG 24 hr capsule TAKE ONE CAPSULE BY MOUTH DAILY 90 capsule 2  ? furosemide (LASIX) 20 MG tablet TAKE 1 TABLET BY MOUTH DAILY 90 tablet 2  ? isosorbide mononitrate (IMDUR) 60 MG 24 hr tablet Take 1 tablet (60 mg total) by mouth daily. 90 tablet 3  ? Lancets (ONETOUCH DELICA PLUS [id number]) MISC TAKE AS DIRECTED ONCE DAILY.    ? levalbuterol (XOPENEX HFA) 45 MCG/ACT inhaler Inhale 1-2 puffs into the lungs every 6 (six) hours as needed for shortness of breath. 1 Inhaler 12  ? losartan (COZAAR) 50 MG tablet Take 1 tablet (50 mg total) by mouth daily. 90 tablet 3  ? metFORMIN (GLUCOPHAGE) 500 MG tablet Take 1 tablet (500 mg total) by mouth 2 (two) times daily with a meal. 180 tablet 3  ? nitroGLYCERIN (NITROSTAT) 0.4 MG SL tablet Place 1 tablet (0.4 mg total) under the tongue every 5 (five) minutes x 3 doses as needed for chest pain. 25 tablet 3  ? ONETOUCH VERIO test strip AS DIRECTED ONCE A DAY    ? pantoprazole (PROTONIX) 40 MG tablet Take 1 tablet (40 mg total) by mouth daily. 90 tablet 0  ? rosuvastatin (CRESTOR) 40 MG tablet Take 1 tablet (40 mg total) by mouth daily. 90 tablet 1  ? spironolactone (ALDACTONE) 50 MG tablet TAKE 1 TABLET(50 MG) BY MOUTH DAILY 30 tablet 6  ? STIOLTO RESPIMAT 2.5-2.5 MCG/ACT AERS INHALE 2 PUFFS INTO THE LUNGS DAILY 4 g 5  ? warfarin (COUMADIN) 5 MG tablet Take 1 tablet (5 mg total) by mouth daily. Refer to most recent anticoagulation note for most accurate information. (Patient taking differently: Take 5 mg by mouth daily. or as directed by coumadin clinic ? ?Currently on: 5 mg every Wed; 7.5 mg all other days) 90 tablet 2  ? warfarin (COUMADIN) 7.5 MG tablet TAKE 1 TABLET BY MOUTH EVERY EVENING 90 tablet 2  ? ?No facility-administered medications prior to visit.  ? ? ?Allergies  ?Allergen Reactions  ? Iodinated  Contrast Media Other (See Comments)  ?  Patient is unsure of reaction type  ? Penicillins Other (See Comments)  ?  Immune to drug , does not work per patient ? ?Did it involve swelling of the face/tongue/throat, SOB, or low BP? No ?Did it involve sudden or severe rash/hives, skin peeling, or any reaction on the inside of your mouth or nose? No ?Did you need to seek medical attention at a hospital  or doctor's office? No ?When did it last happen? NOT A TRUE ALLERGY   ?If all above answers are ?NO?, may proceed with cephalosporin use. ?  ? ? ?Review of Systems ?See pertinent positives and negatives per HPI.'   ?Objective:  ?  ?Physical Exam ?Vitals and nursing note reviewed. Exam conducted with a chaperone present.  ?Constitutional:   ?   General: She is not in acute distress. ?   Appearance: Normal appearance.  ?HENT:  ?   Head: Normocephalic.  ?Eyes:  ?   Conjunctiva/sclera: Conjunctivae normal.  ?Cardiovascular:  ?   Rate and Rhythm: Normal rate and regular rhythm.  ?   Pulses: Normal pulses.  ?   Heart sounds: Normal heart sounds.  ?   Comments: Mechanical valve click ?Pulmonary:  ?   Effort: Pulmonary effort is normal.  ?   Breath sounds: Normal breath sounds.  ?Abdominal:  ?   General: There is no distension.  ?   Palpations: Abdomen is soft.  ?   Tenderness: There is no abdominal tenderness. There is no guarding or rebound.  ?Genitourinary: ?   Comments: Internal hemorrhoids palpated, no rectal fissures noted, no blood on glove with exam ?Musculoskeletal:  ?   Cervical back: Normal range of motion.  ?Skin: ?   General: Skin is warm.  ?Neurological:  ?   General: No focal deficit present.  ?   Mental Status: She is alert and oriented to person, place, and time.  ?Psychiatric:     ?   Mood and Affect: Mood normal.     ?   Behavior: Behavior normal.     ?   Thought Content: Thought content normal.     ?   Judgment: Judgment normal.  ? ? ?BP 130/76 (BP Location: Left Arm, Patient Position: Sitting, Cuff Size: Large)    Pulse 69   Temp (!) 97.2 ?F (36.2 ?C) (Temporal)   Wt 213 lb (96.6 kg) Comment: Patient reported  SpO2 97%   BMI 35.45 kg/m?  ?Wt Readings from Last 3 Encounters:  ?05/18/21 213 lb (96.6 kg)  ?05/10/21 215 lb 6.4 oz (97.7 k

## 2021-05-18 NOTE — Assessment & Plan Note (Signed)
Hemorrhoid palpated on internal rectal exam.  No acute fissures or bright red blood noticed.  She does endorse mild constipation.  Encouraged her to start drinking plenty of fluids and to take MiraLAX daily as needed for constipation.  We will start Anusol suppository twice a day for 6 days.  Follow-up if her symptoms do not improve or worsen. ?

## 2021-05-21 DIAGNOSIS — I480 Paroxysmal atrial fibrillation: Secondary | ICD-10-CM | POA: Diagnosis not present

## 2021-05-21 DIAGNOSIS — Z952 Presence of prosthetic heart valve: Secondary | ICD-10-CM | POA: Diagnosis not present

## 2021-05-21 DIAGNOSIS — Z7901 Long term (current) use of anticoagulants: Secondary | ICD-10-CM | POA: Diagnosis not present

## 2021-05-21 NOTE — Telephone Encounter (Signed)
From patient.

## 2021-05-21 NOTE — Telephone Encounter (Signed)
As long as possible, would avoid holding warfarin and Aspirin. ? ?Thanks ?MJP ? ? ?

## 2021-05-23 ENCOUNTER — Other Ambulatory Visit: Payer: Self-pay | Admitting: Cardiology

## 2021-05-23 DIAGNOSIS — I10 Essential (primary) hypertension: Secondary | ICD-10-CM

## 2021-05-23 DIAGNOSIS — I480 Paroxysmal atrial fibrillation: Secondary | ICD-10-CM

## 2021-05-23 DIAGNOSIS — Z952 Presence of prosthetic heart valve: Secondary | ICD-10-CM

## 2021-05-27 ENCOUNTER — Other Ambulatory Visit: Payer: Self-pay | Admitting: Cardiology

## 2021-05-27 DIAGNOSIS — Z952 Presence of prosthetic heart valve: Secondary | ICD-10-CM

## 2021-05-27 DIAGNOSIS — I480 Paroxysmal atrial fibrillation: Secondary | ICD-10-CM

## 2021-05-27 DIAGNOSIS — Z5181 Encounter for therapeutic drug level monitoring: Secondary | ICD-10-CM

## 2021-05-28 ENCOUNTER — Other Ambulatory Visit: Payer: Self-pay

## 2021-05-28 ENCOUNTER — Ambulatory Visit: Payer: Self-pay | Admitting: Pharmacist

## 2021-05-28 ENCOUNTER — Other Ambulatory Visit: Payer: Self-pay | Admitting: Nurse Practitioner

## 2021-05-28 DIAGNOSIS — F411 Generalized anxiety disorder: Secondary | ICD-10-CM

## 2021-05-28 DIAGNOSIS — Z952 Presence of prosthetic heart valve: Secondary | ICD-10-CM

## 2021-05-28 DIAGNOSIS — Z5181 Encounter for therapeutic drug level monitoring: Secondary | ICD-10-CM

## 2021-05-28 DIAGNOSIS — Z7901 Long term (current) use of anticoagulants: Secondary | ICD-10-CM | POA: Diagnosis not present

## 2021-05-28 LAB — POCT INR: INR: 3 (ref 2.0–3.0)

## 2021-05-28 MED ORDER — AMIODARONE HCL 200 MG PO TABS: 100.0000 mg | ORAL_TABLET | Freq: Every day | ORAL | 3 refills | Status: DC

## 2021-05-28 NOTE — Patient Instructions (Signed)
INR at goal. Continue current weekly dose of 5 mg every Thurs, Sat and 7.5 mg all other days. Recheck INR in 1 week ?

## 2021-05-28 NOTE — Progress Notes (Signed)
Anticoagulation Management ?Kimberly Hobbs is a 61 y.o. female who reports to the clinic for monitoring of warfarin treatment.   ? ?Indication: atrial fibrillation and Hx of Mechanical mitral and aortic valve replacement  ; CHA2DS2 Vasc Score 4 (Female, HTN, DM, Vascular disease hx), HAS-BLED 1 (ASA, prior bleed hx (hematuria, hematochezia) ? ?Duration: indefinite ?Supervising physician: Vernell Leep ? ?Anticoagulation Clinic Visit History: ? ?Patient does not report signs/symptoms of bleeding or thromboembolism  ? ?Other recent changes: No changes in diet, medications, lifestyle.  ? ?Pt maintaining steady Vit-K intake of spinach salad 3-4/week. ? ?Stable on amiodarone 100 mg daily.  ? ?Recent concerns  of rectal bleeding. Reported having 2 blood bowel movements last week. Was seen by her GI provider and was treated for external hemorrhoids. Pt used Anusol. Denies any recurrence since. Pt to continue monitoring. ? ?Anticoagulation Episode Summary   ? ? Current INR goal:  2.5-3.5  ?TTR:  61.1 % (3.1 y)  ?Next INR check:  06/04/2021  ?INR from last check:  3.0 (05/28/2021)  ?Weekly max warfarin dose:    ?Target end date:  Indefinite  ?INR check location:    ?Preferred lab:    ?Send INR reminders to:    ? Indications   ?Status post mechanical aortic valve replacement [Z95.2] ?Monitoring for long-term anticoagulant use [Z51.81 ?Z79.01] ? ?  ?  ? ? Comments:    ?  ? ?  ? ? ?Allergies  ?Allergen Reactions  ? Iodinated Contrast Media Other (See Comments)  ?  Patient is unsure of reaction type  ? Penicillins Other (See Comments)  ?  Immune to drug , does not work per patient ? ?Did it involve swelling of the face/tongue/throat, SOB, or low BP? No ?Did it involve sudden or severe rash/hives, skin peeling, or any reaction on the inside of your mouth or nose? No ?Did you need to seek medical attention at a hospital or doctor's office? No ?When did it last happen? NOT A TRUE ALLERGY   ?If all above answers are ?NO?, may  proceed with cephalosporin use. ?  ? ? ?Current Outpatient Medications:  ?  acetaminophen (TYLENOL) 500 MG tablet, Take 500 mg by mouth every 6 (six) hours as needed (for pain.)., Disp: , Rfl:  ?  albuterol (VENTOLIN HFA) 108 (90 Base) MCG/ACT inhaler, Inhale 2 puffs into the lungs every 6 (six) hours as needed for wheezing or shortness of breath. , Disp: , Rfl:  ?  amiodarone (PACERONE) 200 MG tablet, Take 0.5 tablets (100 mg total) by mouth daily., Disp: 30 tablet, Rfl: 3 ?  aspirin EC 81 MG tablet, Take 81 mg by mouth daily. , Disp: , Rfl:  ?  citalopram (CELEXA) 40 MG tablet, Take 0.5 tablets (20 mg total) by mouth 2 (two) times daily., Disp: 90 tablet, Rfl: 3 ?  clonazePAM (KLONOPIN) 0.5 MG tablet, Take 1.5 tablets (0.75 mg total) by mouth at bedtime. Maintain upcoming appt for additional refills, Disp: 45 tablet, Rfl: 0 ?  diltiazem (CARDIZEM CD) 180 MG 24 hr capsule, TAKE 1 CAPSULE BY MOUTH DAILY, Disp: 90 capsule, Rfl: 2 ?  furosemide (LASIX) 20 MG tablet, TAKE 1 TABLET BY MOUTH DAILY, Disp: 90 tablet, Rfl: 2 ?  hydrocortisone (ANUSOL-HC) 25 MG suppository, Place 1 suppository (25 mg total) rectally 2 (two) times daily., Disp: 12 suppository, Rfl: 0 ?  isosorbide mononitrate (IMDUR) 60 MG 24 hr tablet, Take 1 tablet (60 mg total) by mouth daily., Disp: 90 tablet, Rfl: 3 ?  Lancets (ONETOUCH DELICA PLUS [id number]) MISC, TAKE AS DIRECTED ONCE DAILY., Disp: , Rfl:  ?  levalbuterol (XOPENEX HFA) 45 MCG/ACT inhaler, Inhale 1-2 puffs into the lungs every 6 (six) hours as needed for shortness of breath., Disp: 1 Inhaler, Rfl: 12 ?  losartan (COZAAR) 50 MG tablet, Take 1 tablet (50 mg total) by mouth daily., Disp: 90 tablet, Rfl: 3 ?  metFORMIN (GLUCOPHAGE) 500 MG tablet, Take 1 tablet (500 mg total) by mouth 2 (two) times daily with a meal., Disp: 180 tablet, Rfl: 3 ?  nitroGLYCERIN (NITROSTAT) 0.4 MG SL tablet, Place 1 tablet (0.4 mg total) under the tongue every 5 (five) minutes x 3 doses as needed for chest  pain., Disp: 25 tablet, Rfl: 3 ?  ONETOUCH VERIO test strip, AS DIRECTED ONCE A DAY, Disp: , Rfl:  ?  pantoprazole (PROTONIX) 40 MG tablet, Take 1 tablet (40 mg total) by mouth daily., Disp: 90 tablet, Rfl: 0 ?  rosuvastatin (CRESTOR) 40 MG tablet, Take 1 tablet (40 mg total) by mouth daily., Disp: 90 tablet, Rfl: 1 ?  spironolactone (ALDACTONE) 50 MG tablet, TAKE 1 TABLET(50 MG) BY MOUTH DAILY, Disp: 30 tablet, Rfl: 6 ?  STIOLTO RESPIMAT 2.5-2.5 MCG/ACT AERS, INHALE 2 PUFFS INTO THE LUNGS DAILY, Disp: 4 g, Rfl: 5 ?  warfarin (COUMADIN) 5 MG tablet, TAKE 1 TABLET BY MOUTH DAILY, Disp: 90 tablet, Rfl: 2 ?  warfarin (COUMADIN) 7.5 MG tablet, TAKE 1 TABLET BY MOUTH EVERY EVENING, Disp: 90 tablet, Rfl: 2 ?Past Medical History:  ?Diagnosis Date  ? CKD stage 3 due to type 2 diabetes mellitus ([place]) 05/15/2021  ? COVID-19   ? Hyperlipidemia   ? Hypertension   ? Rheumatic heart disease   ? MS/ AS  ? ? ?ASSESSMENT ? ?Recent Results: ?The most recent result is correlated with 47.5 mg per week: ? ?Lab Results  ?Component Value Date  ? INR 3.0 05/28/2021  ? INR 3.1 (A) 05/14/2021  ? INR 3.0 05/07/2021  ? ? ?Anticoagulation Dosing: ?Description   ?INR at goal. Continue current weekly dose of 5 mg every Thurs, Sat and 7.5 mg all other days. Recheck INR in 1 week ?  ?  ?INR today: Therapeutic. INR continues to remain therapeutic on current weekly dose Pt notes stable Vit K intake. Denies any unusual bleeding or bruising symptoms. Denies any other relevant changes in diet, medications, or lifestyle. Amiodarone dose stable at 100 mg. Will continue current weekly dose and continue remote monitoring.  ? ?PLAN ?Weekly dose was unchanged by 0 % to 47.5 mg/week. Continue current weekly dose of 5 mg every Thurs, Sat and 7.5 mg all other days. Recheck INR in 1 week.  ? ?Patient Instructions  ?INR at goal. Continue current weekly dose of 5 mg every Thurs, Sat and 7.5 mg all other days. Recheck INR in 1 week ?Patient advised to contact clinic  or seek medical attention if signs/symptoms of bleeding or thromboembolism occur. ? ?Patient verbalized understanding by repeating back information and was advised to contact me if further medication-related questions arise.  ? ?Follow-up ?Return in about 1 week (around 06/04/2021). ? ?Kimberly Hobbs, PharmD ? ?15 minutes spent face-to-face with the patient during the encounter. 50% of time spent on education, including signs/sx bleeding and clotting, as well as food and drug interactions with warfarin. 50% of time was spent on fingerprick POC INR sample collection,processing, results determination, and documentation ?

## 2021-05-28 NOTE — Telephone Encounter (Signed)
Caller Name:shelia Whiten ?[phone] ?MEDICATION(S): clonazePAM (KLONOPIN) 0.5 MG tablet [[id number]]  ? ? ?Days of Med Remaining:  ? ?Has the patient contacted their pharmacy (YES/NO)?  yes ?IF YES, when and what did the pharmacy advise? Contact your PCP ?IF NO, request that the patient contact the pharmacy for the refills in the future.  ?           The pharmacy will send an electronic request (except for controlled medications). ? ?Preferred Pharmacy: [facility] [place], [place] - [address] AT [address]  ?[address], [place] [address]  ?Phone:  [phone]  Fax:  [phone]  ?DEA #:  [id number] ? ?~~~Please advise patient/caregiver to allow 2-3 business days to process RX refills. ? ?

## 2021-05-29 NOTE — Telephone Encounter (Signed)
Pt is checking on status of this message, she is completely out.  ?

## 2021-05-29 NOTE — Telephone Encounter (Signed)
PMP database shows last refills on ? ?04/26/21 Quantity 45 ?04/21/21 Quantity 10 ?03/19/21 QTY 45 ?

## 2021-05-30 ENCOUNTER — Ambulatory Visit (INDEPENDENT_AMBULATORY_CARE_PROVIDER_SITE_OTHER): Payer: Medicare Other | Admitting: Licensed Clinical Social Worker

## 2021-05-30 DIAGNOSIS — F411 Generalized anxiety disorder: Secondary | ICD-10-CM

## 2021-05-30 DIAGNOSIS — F4321 Adjustment disorder with depressed mood: Secondary | ICD-10-CM | POA: Diagnosis not present

## 2021-05-30 MED ORDER — CLONAZEPAM 0.5 MG PO TABS: 0.7500 mg | ORAL_TABLET | Freq: Every day | ORAL | 2 refills | Status: DC

## 2021-05-30 NOTE — Telephone Encounter (Signed)
Pt is calling again checking on this refill. She is completely out. Needs an approval for this med.  ?clonazepam ?

## 2021-05-30 NOTE — Progress Notes (Signed)
Comprehensive Clinical Assessment (CCA) Note ? ?[facility] in office visit for patient and LCSW clinician ? ?05/31/2021 ?Kimberly Hobbs ?[id number] ? ?Chief Complaint:  ?Chief Complaint  ?Patient presents with  ? Establish Care  ? Depression  ? Anxiety  ? ?Visit Diagnosis:   ?Encounter Diagnoses  ?Name Primary?  ? Adjustment disorder with depressed mood Yes  ? GAD (generalized anxiety disorder)   ? ?Kimberly Hobbs Is a 61 year old female reporting to [facility] for establishment of psychotherapy services. Pt is currently receiving psychiatric services from Dr. Ursula Alert. Pt takes Celexa to manage symptoms.  Patient denies any current suicidal ideation, homicidal ideation, or perceptual disturbances. Patient reports that she has experienced significant trauma throughout the lifespan. Patient denies any current or past history of substance use. Patient reports that she does have significant medical issues currently, including heart issues, diabetes, high blood pressure, high cholesterol, and kidney disease. Patient denies any psychiatric hospitalizations in the past. Patient reports that she would like support developing coping skills to manage depression symptoms and anxiety symptoms. ? ?CCA Screening, Triage and Referral (STR) ? ?Patient Reported Information ?How did you hear about [place]? Other (Comment) ? ?Referral name: Dr. Shea Evans ? ?Referral phone number: No data recorded ? ?Whom do you see for routine medical problems? No data recorded ?Practice/Facility Name: No data recorded ?Practice/Facility Phone Number: No data recorded ?Name of Contact: No data recorded ?Contact Number: No data recorded ?Contact Fax Number: No data recorded ?Prescriber Name: No data recorded ?Prescriber Address (if known): No data recorded ? ?What Is the Reason for Your Visit/Call Today? Kimberly Hobbs Is a 61 year old female reporting to [facility] for establishment of psychotherapy services. Pt is currently receiving psychiatric services from Dr. Ursula Alert. Pt  takes Celexa to manage symptoms.  Patient denies any current suicidal ideation, homicidal ideation, or perceptual disturbances. Patient reports that she has experienced significant trauma throughout the lifespan. Patient denies any current or past history of substance use. Patient reports that she does have significant medical issues currently, including heart issues, diabetes, high blood pressure, high cholesterol, and kidney disease. Patient denies any psychiatric hospitalizations in the past. Patient reports that she would like support developing coping skills to manage depression symptoms and anxiety symptoms. ? ?How Long Has This Been Causing You Problems? > than 6 months ? ?What Do You Feel Would Help You the Most Today? Treatment for Depression or other mood problem ? ? ?Have You Recently Been in Any Inpatient Treatment (Hospital/Detox/Crisis Center/28-Day Program)? No ? ?Name/Location of Program/Hospital:No data recorded ?How Long Were You There? No data recorded ?When Were You Discharged? No data recorded ? ?Have You Ever Received Services From [facility] Before? Yes ? ?Who Do You See at [facility]? No data recorded ? ?Have You Recently Had Any Thoughts About Hurting Yourself? No ? ?Are You Planning to Commit Suicide/Harm Yourself At This time? No ? ? ?Have you Recently Had Thoughts About [place]? No ? ?Explanation: No data recorded ? ?Have You Used Any Alcohol or Drugs in the Past 24 Hours? No ? ?How Long Ago Did You Use Drugs or Alcohol? No data recorded ?What Did You Use and How Much? No data recorded ? ?Do You Currently Have a Therapist/Psychiatrist? Yes ? ?Name of Therapist/Psychiatrist: Dr. Ursula Alert ? ? ?Have You Been Recently Discharged From Any Office Practice or Programs? No ? ?Explanation of Discharge From Practice/Program: No data recorded ? ?  ?CCA Screening Triage Referral Assessment ?Type of Contact: Face-to-Face ? ?  Is this Initial or Reassessment? No data recorded ?Date  Telepsych consult ordered in [facility]:  No data recorded ?Time Telepsych consult ordered in [facility]:  No data recorded ? ?Patient Reported Information Reviewed? No data recorded ?Patient Left Without Being Seen? No data recorded ?Reason for Not Completing Assessment: No data recorded ? ?Collateral Involvement: none ? ? ?Does Patient Have a Stage manager Guardian? No data recorded ?Name and Contact of Legal Guardian: No data recorded ?If Minor and Not Living with Parent(s), Who has Custody? No data recorded ?Is CPS involved or ever been involved? Never ? ?Is APS involved or ever been involved? Never ? ? ?Patient Determined To Be At Risk for Harm To Self or Others Based on Review of Patient Reported Information or Presenting Complaint? No ? ?Method: No data recorded ?Availability of Means: No data recorded ?Intent: No data recorded ?Notification Required: No data recorded ?Additional Information for Danger to Others Potential: No data recorded ?Additional Comments for Danger to Others Potential: No data recorded ?Are There Guns or Other Weapons in [place]? No data recorded ?Types of Guns/Weapons: No data recorded ?Are These Weapons Safely Secured?                            No data recorded ?Who Could Verify You Are Able To Have These Secured: No data recorded ?Do You Have any Outstanding Charges, Pending Court Dates, Parole/Probation? No data recorded ?Contacted To Inform of Risk of Harm To Self or Others: No data recorded ? ?Location of Assessment: No data recorded ? ?Does Patient Present under Involuntary Commitment? No ? ?IVC Papers Initial File Date: No data recorded ? ?[place] of Residence: No data recorded ? ?Patient Currently Receiving the Following Services: Medication Management ? ? ?Determination of Need: Routine (7 days) ? ? ?Options For Referral: Outpatient Therapy; Medication Management ? ? ? ? ?CCA Biopsychosocial ?Intake/Chief Complaint:  Kimberly Hobbs Is a 61 year old female reporting to [facility] for  establishment of psychotherapy services. Pt is currently receiving psychiatric services from Dr. Ursula Alert. Pt takes Celexa to manage symptoms. Patient denies any current suicidal ideation, homicidal ideation, or perceptual disturbances. Patient reports that she has experienced significant trauma throughout the lifespan. Patient denies any current or past history of substance use. Patient reports that she does have significant medical issues currently, including heart issues, diabetes, high blood pressure, high cholesterol, and kidney disease. Patient denies any psychiatric hospitalizations in the past. Patient reports that she would like support developing coping skills to manage depression symptoms and anxiety symptoms ? ?Current Symptoms/Problems: depression, anxiety ? ? ?Patient Reported Schizophrenia/Schizoaffective Diagnosis in Past: No ? ? ?Strengths: good self awareness ? ?Preferences: outpatient psychiatric supports ? ?Abilities: No data recorded ? ?Type of Services Patient Feels are Needed: medication management; outpatient psychotherapy ? ? ?Initial Clinical Notes/Concerns: No data recorded ? ?Mental Health Symptoms ?Depression:  Tearfulness; Change in energy/activity; Sleep (too much or little); Fatigue; Difficulty Concentrating; Irritability ?  ?Duration of Depressive symptoms: Greater than two weeks ?  ?Mania:  Racing thoughts ?  ?Anxiety:   Worrying; Sleep; Restlessness; Irritability; Fatigue; Difficulty concentrating ?  ?Psychosis:  None ?  ?Duration of Psychotic symptoms: No data recorded  ?Trauma:  Avoids reminders of event; Re-experience of traumatic event ?  ?Obsessions:  None ?  ?Compulsions:  None ?  ?Inattention:  None ?  ?Hyperactivity/Impulsivity:  None ?  ?Oppositional/Defiant Behaviors:  None ?  ?Emotional Irregularity:  None ?  ?  Other Mood/Personality Symptoms:  No data recorded  ? ?Mental Status Exam ?Appearance and self-care  ?Stature:  Average ?  ?Weight:  Average weight ?   ?Clothing:  Neat/clean ?  ?Grooming:  Normal ?  ?Cosmetic use:  Age appropriate ?  ?Posture/gait:  Normal ?  ?Motor activity:  Not Remarkable ?  ?Sensorium  ?Attention:  Normal ?  ?Concentration:  Normal ?  ?Orientatio

## 2021-06-01 ENCOUNTER — Ambulatory Visit: Payer: Medicare Other

## 2021-06-01 NOTE — Telephone Encounter (Signed)
Rx sent by provider on 05/30/21 ?

## 2021-06-01 NOTE — Plan of Care (Signed)
Developed tx plan based on pt self reported input  

## 2021-06-04 ENCOUNTER — Ambulatory Visit: Payer: Self-pay | Admitting: Pharmacist

## 2021-06-04 ENCOUNTER — Encounter: Payer: Self-pay | Admitting: Cardiology

## 2021-06-04 DIAGNOSIS — Z5181 Encounter for therapeutic drug level monitoring: Secondary | ICD-10-CM | POA: Diagnosis not present

## 2021-06-04 DIAGNOSIS — Z952 Presence of prosthetic heart valve: Secondary | ICD-10-CM

## 2021-06-04 DIAGNOSIS — Z7901 Long term (current) use of anticoagulants: Secondary | ICD-10-CM | POA: Diagnosis not present

## 2021-06-04 LAB — POCT INR: INR: 2.4 (ref 2.0–3.0)

## 2021-06-04 NOTE — Patient Instructions (Signed)
INR below goal. Take 10 mg today and then continue current weekly dose of 5 mg every Thurs, Sat and 7.5 mg all other days. Recheck INR in 1 week ?

## 2021-06-04 NOTE — Progress Notes (Signed)
Anticoagulation Management ?Kimberly Hobbs is a 61 y.o. female who reports to the clinic for monitoring of warfarin treatment.   ? ?Indication: atrial fibrillation and Hx of Mechanical mitral and aortic valve replacement  ; CHA2DS2 Vasc Score 4 (Female, HTN, DM, Vascular disease hx), HAS-BLED 1 (ASA, prior bleed hx (hematuria, hematochezia) ? ?Duration: indefinite ?Supervising physician: Vernell Leep ? ?Anticoagulation Clinic Visit History: ? ?Patient does not report signs/symptoms of bleeding or thromboembolism  ? ?Other recent changes: No changes in diet, medications, lifestyle.  ? ?Pt maintaining steady Vit-K intake of spinach salad 3-4/week. ? ?Stable on amiodarone 100 mg daily.  ? ?Pt reports that she has been having intermittent episodes of palpations and flutter over the past 2 days. Symptoms lasts for a few seconds to a minute or so. Had 4 episodes yesterday, but no recurrence this morning. Pt denies any complains of lightheadedness, dizziness, SOB, CP, syncope concerns. Continues to take amiodarone 100 mg daily, diltiazem 180 mg daily. Pt hasnt been wearing her apple watch recently, but will start wearing it more consistently moving forward to ensure pt is able to get any alerts for any irregular heart rhythm moving forward. Pt okay continuing monitoring her symptoms as for right now and calling the office if symptoms continues to persist or worsen.  ? ?Anticoagulation Episode Summary   ? ? Current INR goal:  2.5-3.5  ?TTR:  61.2 % (3.2 y)  ?Next INR check:  06/11/2021  ?INR from last check:  2.4 (06/04/2021)  ?Weekly max warfarin dose:    ?Target end date:  Indefinite  ?INR check location:    ?Preferred lab:    ?Send INR reminders to:    ? Indications   ?Status post mechanical aortic valve replacement [Z95.2] ?Monitoring for long-term anticoagulant use [Z51.81 ?Z79.01] ? ?  ?  ? ? Comments:    ?  ? ?  ? ? ?Allergies  ?Allergen Reactions  ? Iodinated Contrast Media Other (See Comments)  ?  Patient is  unsure of reaction type  ? Penicillins Other (See Comments)  ?  Immune to drug , does not work per patient ? ?Did it involve swelling of the face/tongue/throat, SOB, or low BP? No ?Did it involve sudden or severe rash/hives, skin peeling, or any reaction on the inside of your mouth or nose? No ?Did you need to seek medical attention at a hospital or doctor's office? No ?When did it last happen? NOT A TRUE ALLERGY   ?If all above answers are ?NO?, may proceed with cephalosporin use. ?  ? ? ?Current Outpatient Medications:  ?  acetaminophen (TYLENOL) 500 MG tablet, Take 500 mg by mouth every 6 (six) hours as needed (for pain.)., Disp: , Rfl:  ?  albuterol (VENTOLIN HFA) 108 (90 Base) MCG/ACT inhaler, Inhale 2 puffs into the lungs every 6 (six) hours as needed for wheezing or shortness of breath. , Disp: , Rfl:  ?  amiodarone (PACERONE) 200 MG tablet, Take 0.5 tablets (100 mg total) by mouth daily., Disp: 30 tablet, Rfl: 3 ?  aspirin EC 81 MG tablet, Take 81 mg by mouth daily. , Disp: , Rfl:  ?  citalopram (CELEXA) 40 MG tablet, Take 0.5 tablets (20 mg total) by mouth 2 (two) times daily., Disp: 90 tablet, Rfl: 3 ?  clonazePAM (KLONOPIN) 0.5 MG tablet, Take 1.5 tablets (0.75 mg total) by mouth at bedtime., Disp: 45 tablet, Rfl: 2 ?  diltiazem (CARDIZEM CD) 180 MG 24 hr capsule, TAKE 1 CAPSULE BY MOUTH  DAILY, Disp: 90 capsule, Rfl: 2 ?  furosemide (LASIX) 20 MG tablet, TAKE 1 TABLET BY MOUTH DAILY, Disp: 90 tablet, Rfl: 2 ?  hydrocortisone (ANUSOL-HC) 25 MG suppository, Place 1 suppository (25 mg total) rectally 2 (two) times daily., Disp: 12 suppository, Rfl: 0 ?  isosorbide mononitrate (IMDUR) 60 MG 24 hr tablet, Take 1 tablet (60 mg total) by mouth daily., Disp: 90 tablet, Rfl: 3 ?  Lancets (ONETOUCH DELICA PLUS [id number]) MISC, TAKE AS DIRECTED ONCE DAILY., Disp: , Rfl:  ?  levalbuterol (XOPENEX HFA) 45 MCG/ACT inhaler, Inhale 1-2 puffs into the lungs every 6 (six) hours as needed for shortness of breath., Disp: 1  Inhaler, Rfl: 12 ?  losartan (COZAAR) 50 MG tablet, Take 1 tablet (50 mg total) by mouth daily., Disp: 90 tablet, Rfl: 3 ?  metFORMIN (GLUCOPHAGE) 500 MG tablet, Take 1 tablet (500 mg total) by mouth 2 (two) times daily with a meal., Disp: 180 tablet, Rfl: 3 ?  nitroGLYCERIN (NITROSTAT) 0.4 MG SL tablet, Place 1 tablet (0.4 mg total) under the tongue every 5 (five) minutes x 3 doses as needed for chest pain., Disp: 25 tablet, Rfl: 3 ?  ONETOUCH VERIO test strip, AS DIRECTED ONCE A DAY, Disp: , Rfl:  ?  pantoprazole (PROTONIX) 40 MG tablet, Take 1 tablet (40 mg total) by mouth daily., Disp: 90 tablet, Rfl: 0 ?  rosuvastatin (CRESTOR) 40 MG tablet, Take 1 tablet (40 mg total) by mouth daily., Disp: 90 tablet, Rfl: 1 ?  spironolactone (ALDACTONE) 50 MG tablet, TAKE 1 TABLET(50 MG) BY MOUTH DAILY, Disp: 30 tablet, Rfl: 6 ?  STIOLTO RESPIMAT 2.5-2.5 MCG/ACT AERS, INHALE 2 PUFFS INTO THE LUNGS DAILY, Disp: 4 g, Rfl: 5 ?  warfarin (COUMADIN) 5 MG tablet, TAKE 1 TABLET BY MOUTH DAILY, Disp: 90 tablet, Rfl: 2 ?  warfarin (COUMADIN) 7.5 MG tablet, TAKE 1 TABLET BY MOUTH EVERY EVENING, Disp: 90 tablet, Rfl: 2 ?Past Medical History:  ?Diagnosis Date  ? CKD stage 3 due to type 2 diabetes mellitus ([place]) 05/15/2021  ? COVID-19   ? Hyperlipidemia   ? Hypertension   ? Rheumatic heart disease   ? MS/ AS  ? ? ?ASSESSMENT ? ?Recent Results: ?The most recent result is correlated with 47.5 mg per week: ? ?Lab Results  ?Component Value Date  ? INR 2.4 06/04/2021  ? INR 3.0 05/28/2021  ? INR 3.1 (A) 05/14/2021  ? ? ?Anticoagulation Dosing: ?Description   ?INR below goal. Take 10 mg today and then continue current weekly dose of 5 mg every Thurs, Sat and 7.5 mg all other days. Recheck INR in 1 week ?  ?  ?INR today: Subtherapeutic. INR slightly below goal. Previously stable and controlled on current weekly dose of 47.5 mg/week. Denies any unusual bleeding or bruising symptoms. Denies any other relevant changes in diet, medications, or  lifestyle. Amiodarone dose stable at 100 mg. Will boost today and continue previously stable weekly dose. If INR continues to be subtherapeutic at next check, will increase weekly dose.  ? ?PLAN ?Weekly dose was unchanged by 0 % to 47.5 mg/week. Take 10 mg today and then continue current weekly dose of 5 mg every Thurs, Sat and 7.5 mg all other days. Recheck INR in 1 week.  ? ?Patient Instructions  ?INR below goal. Take 10 mg today and then continue current weekly dose of 5 mg every Thurs, Sat and 7.5 mg all other days. Recheck INR in 1 week ?Patient advised to contact clinic  or seek medical attention if signs/symptoms of bleeding or thromboembolism occur. ? ?Patient verbalized understanding by repeating back information and was advised to contact me if further medication-related questions arise.  ? ?Follow-up ?Return in about 1 week (around 06/11/2021). ? ?[name], PharmD ? ?15 minutes spent face-to-face with the patient during the encounter. 50% of time spent on education, including signs/sx bleeding and clotting, as well as food and drug interactions with warfarin. 50% of time was spent on fingerprick POC INR sample collection,processing, results determination, and documentation ?

## 2021-06-04 NOTE — Telephone Encounter (Signed)
From patient.

## 2021-06-05 NOTE — Telephone Encounter (Signed)
Agree 

## 2021-06-08 ENCOUNTER — Telehealth: Payer: Self-pay

## 2021-06-08 NOTE — Telephone Encounter (Signed)
Patient called and stated that she has been in Afib for about in hour now. She wants to know if there is anything she needs to do. Please advise. ?

## 2021-06-08 NOTE — Telephone Encounter (Signed)
Called and spoke to pt, pt voiced understanding, pts bp was 147/79 with HR 77. Advised pt not to take the extra dose of amiodarone and to keep her follow up in May. Pt will give [place] a call if her HR goes up.

## 2021-06-08 NOTE — Telephone Encounter (Signed)
Take one extra dose of amiodarone. If rate controlled, don't need to do anything else for now. If rate consistently >120, consider going to ER for possible cardioversion. ? ?Thanks ?MJP ? ?

## 2021-06-11 ENCOUNTER — Ambulatory Visit: Payer: Self-pay | Admitting: Pharmacist

## 2021-06-11 DIAGNOSIS — Z952 Presence of prosthetic heart valve: Secondary | ICD-10-CM | POA: Diagnosis not present

## 2021-06-11 DIAGNOSIS — Z7901 Long term (current) use of anticoagulants: Secondary | ICD-10-CM | POA: Diagnosis not present

## 2021-06-11 DIAGNOSIS — Z5181 Encounter for therapeutic drug level monitoring: Secondary | ICD-10-CM | POA: Diagnosis not present

## 2021-06-11 LAB — POCT INR: INR: 2.1 (ref 2.0–3.0)

## 2021-06-11 NOTE — Progress Notes (Signed)
Anticoagulation Management ?Kimberly Hobbs is a 61 y.o. female who reports to the clinic for monitoring of warfarin treatment.   ? ?Indication: atrial fibrillation and Hx of Mechanical mitral and aortic valve replacement  ; CHA2DS2 Vasc Score 4 (Female, HTN, DM, Vascular disease hx), HAS-BLED 1 (ASA, prior bleed hx (hematuria, hematochezia) ? ?Duration: indefinite ?Supervising physician: Vernell Leep ? ?Anticoagulation Clinic Visit History: ? ?Patient does not report signs/symptoms of bleeding or thromboembolism  ? ?Other recent changes: No changes in diet, medications, lifestyle.  ? ?Pt maintaining steady Vit-K intake of spinach salad 3-4/week. ? ?Stable on amiodarone 100 mg daily.  ? ?Pt reports that she has been having intermittent episodes of palpations and flutter over the past 2 days. Symptoms lasts for a few seconds to a minute or so. Had 4 episodes yesterday, but no recurrence this morning. Pt denies any complains of lightheadedness, dizziness, SOB, CP, syncope concerns. Continues to take amiodarone 100 mg daily, diltiazem 180 mg daily. Pt hasnt been wearing her apple watch recently, but will start wearing it more consistently moving forward to ensure pt is able to get any alerts for any irregular heart rhythm moving forward. Pt okay continuing monitoring her symptoms as for right now and calling the office if symptoms continues to persist or worsen.  ? ?Pt reports that she has been previously taking warfarin 5 mg every Tues, Thurs, Sat and 7.5 mg all other days instead of previously discussed dose of 5 mg every Thurs, Sat and 7.5 mg all other days.  ? ?Anticoagulation Episode Summary   ? ? Current INR goal:  2.5-3.5  ?TTR:  60.9 % (3.2 y)  ?Next INR check:  06/18/2021  ?INR from last check:  2.1 (06/11/2021)  ?Weekly max warfarin dose:    ?Target end date:  Indefinite  ?INR check location:    ?Preferred lab:    ?Send INR reminders to:    ? Indications   ?Status post mechanical aortic valve replacement  [Z95.2] ?Monitoring for long-term anticoagulant use [Z51.81 ?Z79.01] ? ?  ?  ? ? Comments:    ?  ? ?  ? ? ?Allergies  ?Allergen Reactions  ? Iodinated Contrast Media Other (See Comments)  ?  Patient is unsure of reaction type  ? Penicillins Other (See Comments)  ?  Immune to drug , does not work per patient ? ?Did it involve swelling of the face/tongue/throat, SOB, or low BP? No ?Did it involve sudden or severe rash/hives, skin peeling, or any reaction on the inside of your mouth or nose? No ?Did you need to seek medical attention at a hospital or doctor's office? No ?When did it last happen? NOT A TRUE ALLERGY   ?If all above answers are ?NO?, may proceed with cephalosporin use. ?  ? ? ?Current Outpatient Medications:  ?  acetaminophen (TYLENOL) 500 MG tablet, Take 500 mg by mouth every 6 (six) hours as needed (for pain.)., Disp: , Rfl:  ?  albuterol (VENTOLIN HFA) 108 (90 Base) MCG/ACT inhaler, Inhale 2 puffs into the lungs every 6 (six) hours as needed for wheezing or shortness of breath. , Disp: , Rfl:  ?  amiodarone (PACERONE) 200 MG tablet, Take 0.5 tablets (100 mg total) by mouth daily., Disp: 30 tablet, Rfl: 3 ?  aspirin EC 81 MG tablet, Take 81 mg by mouth daily. , Disp: , Rfl:  ?  citalopram (CELEXA) 40 MG tablet, Take 0.5 tablets (20 mg total) by mouth 2 (two) times daily., Disp: 90  tablet, Rfl: 3 ?  clonazePAM (KLONOPIN) 0.5 MG tablet, Take 1.5 tablets (0.75 mg total) by mouth at bedtime., Disp: 45 tablet, Rfl: 2 ?  diltiazem (CARDIZEM CD) 180 MG 24 hr capsule, TAKE 1 CAPSULE BY MOUTH DAILY, Disp: 90 capsule, Rfl: 2 ?  furosemide (LASIX) 20 MG tablet, TAKE 1 TABLET BY MOUTH DAILY, Disp: 90 tablet, Rfl: 2 ?  hydrocortisone (ANUSOL-HC) 25 MG suppository, Place 1 suppository (25 mg total) rectally 2 (two) times daily., Disp: 12 suppository, Rfl: 0 ?  isosorbide mononitrate (IMDUR) 60 MG 24 hr tablet, Take 1 tablet (60 mg total) by mouth daily., Disp: 90 tablet, Rfl: 3 ?  Lancets (ONETOUCH DELICA PLUS  [id number]) MISC, TAKE AS DIRECTED ONCE DAILY., Disp: , Rfl:  ?  levalbuterol (XOPENEX HFA) 45 MCG/ACT inhaler, Inhale 1-2 puffs into the lungs every 6 (six) hours as needed for shortness of breath., Disp: 1 Inhaler, Rfl: 12 ?  losartan (COZAAR) 50 MG tablet, Take 1 tablet (50 mg total) by mouth daily., Disp: 90 tablet, Rfl: 3 ?  metFORMIN (GLUCOPHAGE) 500 MG tablet, Take 1 tablet (500 mg total) by mouth 2 (two) times daily with a meal., Disp: 180 tablet, Rfl: 3 ?  nitroGLYCERIN (NITROSTAT) 0.4 MG SL tablet, Place 1 tablet (0.4 mg total) under the tongue every 5 (five) minutes x 3 doses as needed for chest pain., Disp: 25 tablet, Rfl: 3 ?  ONETOUCH VERIO test strip, AS DIRECTED ONCE A DAY, Disp: , Rfl:  ?  pantoprazole (PROTONIX) 40 MG tablet, Take 1 tablet (40 mg total) by mouth daily., Disp: 90 tablet, Rfl: 0 ?  rosuvastatin (CRESTOR) 40 MG tablet, Take 1 tablet (40 mg total) by mouth daily., Disp: 90 tablet, Rfl: 1 ?  spironolactone (ALDACTONE) 50 MG tablet, TAKE 1 TABLET(50 MG) BY MOUTH DAILY, Disp: 30 tablet, Rfl: 6 ?  STIOLTO RESPIMAT 2.5-2.5 MCG/ACT AERS, INHALE 2 PUFFS INTO THE LUNGS DAILY, Disp: 4 g, Rfl: 5 ?  warfarin (COUMADIN) 5 MG tablet, TAKE 1 TABLET BY MOUTH DAILY, Disp: 90 tablet, Rfl: 2 ?  warfarin (COUMADIN) 7.5 MG tablet, TAKE 1 TABLET BY MOUTH EVERY EVENING, Disp: 90 tablet, Rfl: 2 ?Past Medical History:  ?Diagnosis Date  ? CKD stage 3 due to type 2 diabetes mellitus ([place]) 05/15/2021  ? COVID-19   ? Hyperlipidemia   ? Hypertension   ? Rheumatic heart disease   ? MS/ AS  ? ? ?ASSESSMENT ? ?Recent Results: ?The most recent result is correlated with 47.5 mg per week: ? ?Lab Results  ?Component Value Date  ? INR 2.1 06/11/2021  ? INR 2.4 06/04/2021  ? INR 3.0 05/28/2021  ? ? ?Anticoagulation Dosing: ?Description   ?INR below goal. Take 10 mg today and then increase weekly dose to 5 mg every Thurs, Sat and 7.5 mg all other days. Recheck INR in 1 week.  ?  ?  ?INR today: Subtherapeutic. INR continues to  trend down in lower than previously discussed weekly warfarin dose. Previously stable and controlled on current weekly dose of 47.5 mg/week. Denies any unusual bleeding or bruising symptoms. Denies any other relevant changes in diet, medications, or lifestyle. Amiodarone dose stable at 100 mg. Will boost today and continue previously stable weekly dose. Will have pt retry weekly dose of 47.5 mg/week. ? ?PLAN ?Weekly dose was unchanged by 0 % to 47.5 mg/week. Take 10 mg today and then continue current weekly dose of 5 mg every Thurs, Sat and 7.5 mg all other days. Recheck INR  in 1 week.  ? ?Patient Instructions  ?INR below goal. Take 10 mg today and then increase weekly dose to 5 mg every Thurs, Sat and 7.5 mg all other days. Recheck INR in 1 week.  ?Patient advised to contact clinic or seek medical attention if signs/symptoms of bleeding or thromboembolism occur. ? ?Patient verbalized understanding by repeating back information and was advised to contact me if further medication-related questions arise.  ? ?Follow-up ?Return in about 1 week (around 06/18/2021). ? ?[name], PharmD ? ?15 minutes spent face-to-face with the patient during the encounter. 50% of time spent on education, including signs/sx bleeding and clotting, as well as food and drug interactions with warfarin. 50% of time was spent on fingerprick POC INR sample collection,processing, results determination, and documentation ?

## 2021-06-11 NOTE — Patient Instructions (Signed)
INR below goal. Take 10 mg today and then increase weekly dose to 5 mg every Thurs, Sat and 7.5 mg all other days. Recheck INR in 1 week.  ?

## 2021-06-13 ENCOUNTER — Other Ambulatory Visit: Payer: Self-pay

## 2021-06-13 LAB — PROTIME-INR

## 2021-06-14 ENCOUNTER — Other Ambulatory Visit: Payer: Medicare Other

## 2021-06-18 ENCOUNTER — Ambulatory Visit: Payer: Self-pay | Admitting: Pharmacist

## 2021-06-18 DIAGNOSIS — Z952 Presence of prosthetic heart valve: Secondary | ICD-10-CM | POA: Diagnosis not present

## 2021-06-18 DIAGNOSIS — Z7901 Long term (current) use of anticoagulants: Secondary | ICD-10-CM

## 2021-06-18 DIAGNOSIS — Z5181 Encounter for therapeutic drug level monitoring: Secondary | ICD-10-CM | POA: Diagnosis not present

## 2021-06-18 DIAGNOSIS — I480 Paroxysmal atrial fibrillation: Secondary | ICD-10-CM | POA: Diagnosis not present

## 2021-06-18 LAB — POCT INR: INR: 3.1 — AB (ref 2.0–3.0)

## 2021-06-18 NOTE — Patient Instructions (Signed)
INR at goal. Continue weekly dose to 5 mg every Thurs, Sat and 7.5 mg all other days. Recheck INR in 1 week.  ?

## 2021-06-18 NOTE — Progress Notes (Signed)
Anticoagulation Management ?Kimberly Hobbs is a 61 y.o. female who reports to the clinic for monitoring of warfarin treatment.   ? ?Indication: atrial fibrillation and Hx of Mechanical mitral and aortic valve replacement  ; CHA2DS2 Vasc Score 4 (Female, HTN, DM, Vascular disease hx), HAS-BLED 1 (ASA, prior bleed hx (hematuria, hematochezia) ? ?Duration: indefinite ?Supervising physician: Vernell Leep ? ?Anticoagulation Clinic Visit History: ? ?Patient does not report signs/symptoms of bleeding or thromboembolism  ? ?Other recent changes: No changes in diet, medications, lifestyle.  ? ?Pt maintaining steady Vit-K intake of spinach salad 3-4/week. ? ?Stable on amiodarone 100 mg daily.  ? ?Pt continues to have intermittent episodes of palpations and flutters. Symptoms lasts for a few seconds to a minute or so. Denies any recent increase in the frequency or severity of pt's symptoms. Pt agreeable to continue monitoring her symptoms and waiting till next week for her scheduled OV to discuss further management.  ? ?Anticoagulation Episode Summary   ? ? Current INR goal:  2.5-3.5  ?TTR:  60.9 % (3.2 y)  ?Next INR check:  06/25/2021  ?INR from last check:  3.1 (06/18/2021)  ?Weekly max warfarin dose:    ?Target end date:  Indefinite  ?INR check location:    ?Preferred lab:    ?Send INR reminders to:    ? Indications   ?Status post mechanical aortic valve replacement [Z95.2] ?Monitoring for long-term anticoagulant use [Z51.81 ?Z79.01] ? ?  ?  ? ? Comments:    ?  ? ?  ? ? ?Allergies  ?Allergen Reactions  ? Iodinated Contrast Media Other (See Comments)  ?  Patient is unsure of reaction type  ? Penicillins Other (See Comments)  ?  Immune to drug , does not work per patient ? ?Did it involve swelling of the face/tongue/throat, SOB, or low BP? No ?Did it involve sudden or severe rash/hives, skin peeling, or any reaction on the inside of your mouth or nose? No ?Did you need to seek medical attention at a hospital or doctor's  office? No ?When did it last happen? NOT A TRUE ALLERGY   ?If all above answers are ?NO?, may proceed with cephalosporin use. ?  ? ? ?Current Outpatient Medications:  ?  acetaminophen (TYLENOL) 500 MG tablet, Take 500 mg by mouth every 6 (six) hours as needed (for pain.)., Disp: , Rfl:  ?  albuterol (VENTOLIN HFA) 108 (90 Base) MCG/ACT inhaler, Inhale 2 puffs into the lungs every 6 (six) hours as needed for wheezing or shortness of breath. , Disp: , Rfl:  ?  amiodarone (PACERONE) 200 MG tablet, Take 0.5 tablets (100 mg total) by mouth daily., Disp: 30 tablet, Rfl: 3 ?  aspirin EC 81 MG tablet, Take 81 mg by mouth daily. , Disp: , Rfl:  ?  citalopram (CELEXA) 40 MG tablet, Take 0.5 tablets (20 mg total) by mouth 2 (two) times daily., Disp: 90 tablet, Rfl: 3 ?  clonazePAM (KLONOPIN) 0.5 MG tablet, Take 1.5 tablets (0.75 mg total) by mouth at bedtime., Disp: 45 tablet, Rfl: 2 ?  diltiazem (CARDIZEM CD) 180 MG 24 hr capsule, TAKE 1 CAPSULE BY MOUTH DAILY, Disp: 90 capsule, Rfl: 2 ?  furosemide (LASIX) 20 MG tablet, TAKE 1 TABLET BY MOUTH DAILY, Disp: 90 tablet, Rfl: 2 ?  hydrocortisone (ANUSOL-HC) 25 MG suppository, Place 1 suppository (25 mg total) rectally 2 (two) times daily., Disp: 12 suppository, Rfl: 0 ?  isosorbide mononitrate (IMDUR) 60 MG 24 hr tablet, Take 1 tablet (60  mg total) by mouth daily., Disp: 90 tablet, Rfl: 3 ?  Lancets (ONETOUCH DELICA PLUS [id number]) MISC, TAKE AS DIRECTED ONCE DAILY., Disp: , Rfl:  ?  levalbuterol (XOPENEX HFA) 45 MCG/ACT inhaler, Inhale 1-2 puffs into the lungs every 6 (six) hours as needed for shortness of breath., Disp: 1 Inhaler, Rfl: 12 ?  losartan (COZAAR) 50 MG tablet, Take 1 tablet (50 mg total) by mouth daily., Disp: 90 tablet, Rfl: 3 ?  metFORMIN (GLUCOPHAGE) 500 MG tablet, Take 1 tablet (500 mg total) by mouth 2 (two) times daily with a meal., Disp: 180 tablet, Rfl: 3 ?  nitroGLYCERIN (NITROSTAT) 0.4 MG SL tablet, Place 1 tablet (0.4 mg total) under the tongue every 5  (five) minutes x 3 doses as needed for chest pain., Disp: 25 tablet, Rfl: 3 ?  ONETOUCH VERIO test strip, AS DIRECTED ONCE A DAY, Disp: , Rfl:  ?  pantoprazole (PROTONIX) 40 MG tablet, Take 1 tablet (40 mg total) by mouth daily., Disp: 90 tablet, Rfl: 0 ?  rosuvastatin (CRESTOR) 40 MG tablet, Take 1 tablet (40 mg total) by mouth daily., Disp: 90 tablet, Rfl: 1 ?  spironolactone (ALDACTONE) 50 MG tablet, TAKE 1 TABLET(50 MG) BY MOUTH DAILY, Disp: 30 tablet, Rfl: 6 ?  STIOLTO RESPIMAT 2.5-2.5 MCG/ACT AERS, INHALE 2 PUFFS INTO THE LUNGS DAILY, Disp: 4 g, Rfl: 5 ?  warfarin (COUMADIN) 5 MG tablet, TAKE 1 TABLET BY MOUTH DAILY, Disp: 90 tablet, Rfl: 2 ?  warfarin (COUMADIN) 7.5 MG tablet, TAKE 1 TABLET BY MOUTH EVERY EVENING, Disp: 90 tablet, Rfl: 2 ?Past Medical History:  ?Diagnosis Date  ? CKD stage 3 due to type 2 diabetes mellitus ([place]) 05/15/2021  ? COVID-19   ? Hyperlipidemia   ? Hypertension   ? Rheumatic heart disease   ? MS/ AS  ? ? ?ASSESSMENT ? ?Recent Results: ?The most recent result is correlated with 50 mg per week: ? ?Lab Results  ?Component Value Date  ? INR 3.1 (A) 06/18/2021  ? INR 2.1 06/11/2021  ? INR 2.4 06/04/2021  ? ? ?Anticoagulation Dosing: ?Description   ?INR at goal. Continue weekly dose to 5 mg every Thurs, Sat and 7.5 mg all other days. Recheck INR in 1 week.  ?  ?  ?INR today: Therapeutic. INR improves following recent weekly dose increase. Denies any unusual bleeding or bruising symptoms. Denies any other relevant changes in diet, medications, or lifestyle. Amiodarone dose stable at 100 mg. Will continue current weekly dose and continue close monitoring and follow up as scheduled.  ? ?PLAN ?Weekly dose was unchanged by 0 % to 47.5 mg/week. Continue current weekly dose of 5 mg every Thurs, Sat and 7.5 mg all other days. Recheck INR in 1 week.  ? ?Patient Instructions  ?INR at goal. Continue weekly dose to 5 mg every Thurs, Sat and 7.5 mg all other days. Recheck INR in 1 week.  ?Patient  advised to contact clinic or seek medical attention if signs/symptoms of bleeding or thromboembolism occur. ? ?Patient verbalized understanding by repeating back information and was advised to contact me if further medication-related questions arise.  ? ?Follow-up ?Return in about 1 week (around 06/25/2021). ? ?[name], PharmD ? ?15 minutes spent face-to-face with the patient during the encounter. 50% of time spent on education, including signs/sx bleeding and clotting, as well as food and drug interactions with warfarin. 50% of time was spent on fingerprick POC INR sample collection,processing, results determination, and documentation ?

## 2021-06-19 ENCOUNTER — Other Ambulatory Visit (INDEPENDENT_AMBULATORY_CARE_PROVIDER_SITE_OTHER): Payer: Medicare Other

## 2021-06-19 ENCOUNTER — Encounter: Payer: Self-pay | Admitting: Nurse Practitioner

## 2021-06-19 DIAGNOSIS — R7989 Other specified abnormal findings of blood chemistry: Secondary | ICD-10-CM

## 2021-06-19 DIAGNOSIS — E1122 Type 2 diabetes mellitus with diabetic chronic kidney disease: Secondary | ICD-10-CM | POA: Diagnosis not present

## 2021-06-19 DIAGNOSIS — E1165 Type 2 diabetes mellitus with hyperglycemia: Secondary | ICD-10-CM

## 2021-06-19 DIAGNOSIS — I10 Essential (primary) hypertension: Secondary | ICD-10-CM

## 2021-06-19 DIAGNOSIS — N183 Chronic kidney disease, stage 3 unspecified: Secondary | ICD-10-CM | POA: Diagnosis not present

## 2021-06-19 LAB — BASIC METABOLIC PANEL
BUN: 25 mg/dL — ABNORMAL HIGH (ref 6–23)
CO2: 25 mEq/L (ref 19–32)
Calcium: 9.1 mg/dL (ref 8.4–10.5)
Chloride: 102 mEq/L (ref 96–112)
Creatinine, Ser: 1.48 mg/dL — ABNORMAL HIGH (ref 0.40–1.20)
GFR: 38.44 mL/min — ABNORMAL LOW (ref >60.00)
Glucose, Bld: 102 mg/dL — ABNORMAL HIGH (ref 70–99)
Potassium: 4.1 mEq/L (ref 3.5–5.1)
Sodium: 135 mEq/L (ref 135–145)

## 2021-06-19 LAB — HEPATIC FUNCTION PANEL
ALT: 42 U/L — ABNORMAL HIGH (ref 0–35)
AST: 32 U/L (ref 0–37)
Albumin: 4.5 g/dL (ref 3.5–5.2)
Alkaline Phosphatase: 98 U/L (ref 39–117)
Bilirubin, Direct: 0 mg/dL (ref 0.0–0.3)
Total Bilirubin: 0.3 mg/dL (ref 0.2–1.2)
Total Protein: 7.4 g/dL (ref 6.0–8.3)

## 2021-06-19 NOTE — Progress Notes (Signed)
Per the orders of  Wilfred Lacy NP pt is here for labs, pt tolerated draw well. ? ?

## 2021-06-20 ENCOUNTER — Encounter: Payer: Self-pay | Admitting: Student

## 2021-06-20 ENCOUNTER — Other Ambulatory Visit: Payer: Medicare Other

## 2021-06-20 ENCOUNTER — Ambulatory Visit: Payer: Medicare Other | Admitting: Student

## 2021-06-20 VITALS — BP 119/55 | HR 73 | Temp 98.0°F | Resp 16 | Ht 65.0 in | Wt 213.0 lb

## 2021-06-20 DIAGNOSIS — R002 Palpitations: Secondary | ICD-10-CM

## 2021-06-20 DIAGNOSIS — I480 Paroxysmal atrial fibrillation: Secondary | ICD-10-CM | POA: Diagnosis not present

## 2021-06-20 MED ORDER — METOPROLOL TARTRATE 25 MG PO TABS: 25.0000 mg | ORAL_TABLET | Freq: Two times a day (BID) | ORAL | 3 refills | Status: DC

## 2021-06-20 NOTE — Progress Notes (Signed)
? ? ?Subjective:  ? ?Kimberly Hobbs, female    DOB: Jul 26, 1960, 61 y.o.   MRN: [id number] ? ? ?Chief complaint:  ?Irregular heart beat ? ?61 y.o. 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 ? ?Patient presents for 3 month follow up.  At last office visit patient was in sinus bradycardia, therefore stopped Lopressor 50 mg and reduced amiodarone from 200 mg to 100 mg daily.  Patient now presents today with episodes of palpitations occurring multiple times daily lasting several seconds feeling like her heart "skips a beat".  Patient expresses concern she is having recurrent episodes of atrial fibrillation, however symptoms appear more consistent with PACs or PVCs.  Denies chest pain, dyspnea, dizziness, syncope, near syncope. ? ?Current Outpatient Medications on File Prior to Visit  ?Medication Sig Dispense Refill  ? acetaminophen (TYLENOL) 500 MG tablet Take 500 mg by mouth every 6 (six) hours as needed (for pain.).    ? albuterol (VENTOLIN HFA) 108 (90 Base) MCG/ACT inhaler Inhale 2 puffs into the lungs every 6 (six) hours as needed for wheezing or shortness of breath.     ? amiodarone (PACERONE) 200 MG tablet Take 0.5 tablets (100 mg total) by mouth daily. 30 tablet 3  ? aspirin EC 81 MG tablet Take 81 mg by mouth daily.     ? citalopram (CELEXA) 40 MG tablet Take 0.5 tablets (20 mg total) by mouth 2 (two) times daily. 90 tablet 3  ? clonazePAM (KLONOPIN) 0.5 MG tablet Take 1.5 tablets (0.75 mg total) by mouth at bedtime. 45 tablet 2  ? diltiazem (CARDIZEM CD) 180 MG 24 hr capsule TAKE 1 CAPSULE BY MOUTH DAILY 90 capsule 2  ? furosemide (LASIX) 20 MG tablet TAKE 1 TABLET BY MOUTH DAILY 90 tablet 2  ? isosorbide mononitrate (IMDUR) 60 MG 24 hr tablet Take 1 tablet (60 mg total) by mouth daily. 90 tablet 3  ? Lancets  (ONETOUCH DELICA PLUS [id number]) MISC TAKE AS DIRECTED ONCE DAILY.    ? levalbuterol (XOPENEX HFA) 45 MCG/ACT inhaler Inhale 1-2 puffs into the lungs every 6 (six) hours as needed for shortness of breath. 1 Inhaler 12  ? losartan (COZAAR) 50 MG tablet Take 1 tablet (50 mg total) by mouth daily. 90 tablet 3  ? metFORMIN (GLUCOPHAGE) 500 MG tablet Take 1 tablet (500 mg total) by mouth 2 (two) times daily with a meal. 180 tablet 3  ? nitroGLYCERIN (NITROSTAT) 0.4 MG SL tablet Place 1 tablet (0.4 mg total) under the tongue every 5 (five) minutes x 3 doses as needed for chest pain. 25 tablet 3  ? ONETOUCH VERIO test strip AS DIRECTED ONCE A DAY    ? pantoprazole (PROTONIX) 40 MG tablet Take 1 tablet (40 mg total) by mouth daily. 90 tablet 0  ? rosuvastatin (CRESTOR) 40 MG tablet Take 1 tablet (40 mg total) by mouth daily. 90 tablet 1  ? spironolactone (ALDACTONE) 50 MG tablet TAKE 1 TABLET(50 MG) BY MOUTH DAILY 30 tablet 6  ? STIOLTO RESPIMAT 2.5-2.5 MCG/ACT AERS INHALE 2 PUFFS INTO THE LUNGS DAILY 4 g 5  ? warfarin (COUMADIN) 5 MG tablet TAKE 1 TABLET BY MOUTH DAILY 90 tablet 2  ? warfarin (COUMADIN) 7.5 MG tablet TAKE 1 TABLET BY MOUTH EVERY EVENING 90 tablet 2  ? ?No current facility-administered medications on file prior to visit.  ? ? ?  Cardiovascular studies: ?EKG 06/20/2021:  ?Sinus rhythm at a rate of 70 bpm.  Left axis, left anterior fascicular block. ? ?Echocardiogram 06/01/2020:  ?Normal LV systolic function with EF 55%. Left ventricle cavity is normal  ?in size. Normal global wall motion. Calculated EF 55%.  ?Left atrial cavity is moderately dilated.  ?Well seated mechanical aortic valve.  mean PG 22 mmHg with acceleration  ?time <100 msec. Above findings similar to previous TTE and TEE in 2020,  ?and suggest patient prosthesis mismatch in 19 mm St Jude valve, rather  ?than prosthetic valve stenosis. DVI of 0.18 is likely erroneous due  ?improper LV velocity measurement.  Mild (Grade I) aortic regurgitation.   ?Well seated, well functioning 25 mm Regent mechanical mitral valve. Mean  ?PG 3 mmHg, which is normal. Trace mitral regurgitation.  ?Estimated PASP 31 mmHg.  ?No significant change compared to previous transthoracic and  ?transesophageal studies in 2020. ? ?EKG 01/22/2021: ?Probable sinus bradycardia 46 bpm ?LAFB ? ?Coronary and bypass graft angiography 07/21/2018 and 08/18/2018: ?LM: Normal ?LAD: 100% mid occlusion. Mild distal disease ?LIMA-LAD: Patent ?LCx: Normal ?RCA: Prox 60% stenosis, mid 30-40% stenoses. TIMI III flow in prox-mid RCA. 100% distal occlusion ?SVG-RCA: Patent. Ostial 40%. Distal RCA moderate diffuse disease.  ?  ?Guide catheter angiography provided superior images. There is TIMI III flow in SVG which fills RCA all the way to the ostium. Even if she were to have FFR positive lesion in the graft, I do not think the benefits of a stent would outweigh the risk of losing the graft and the entire RCA territory circulation in a borderline lesion. Also, the prox RCA lesion is well bypassed by the SVG graft. Thus, I decided not to perform FFR/PCI to either of these lesions. Continue medical management.  ? ?[place] 07/21/2018: ?#1: ?RA: 18 mmHg ?RV 90/12 mmHg ?PA: 94/40 mmHg. Mean PA 63 mmHg. ?PW 33 mmHg ? ?Lasix 80 mg ? ?#2: ?RA 19 mmHg ?RV 60/0 mmHg ?PA: 56/16 mmHg. Mean PA 36 mmHg ?PW: 25 mmHg ? ?CO: 5.4 L/min. CI 2.6 L/min/m2 ?PVR 2 WU ? ?Impression: ?Elevated filling pressures ?Moderate WHO Grp II pulmonary hypertension ?Improvement in filling pressures with diuresis. ? ?TEE 07/21/2018: ? 1. The left ventricle has normal systolic function, with an ejection fraction of 55-60%. There is abnormal septal motion consistent with post-operative status. ? 2. The right ventricle has normal systolc function. ? 3. A 25 mm Regent mechanical valve is present in the mitral position. Procedure Date: 2018 Echo findings are consistent with is functioning normally the mitral prosthesis. Normal mitral valve prosthesis. Mean  PG 6 mmHg, MVA 1.7 cm2. No thrombus seen.  ?(Patient known to have subtherapeutic INR). ? 4. A 8m St. Jude mechanical prosthesis valve is present in the aortic position. Procedure Date: 2018 Normal aortic valve prosthesis. Echo findings shows no evidence of Accelration time of 88 msec suggests normal functioning valve. Mean PG 25 mmHg  ?likely due to small annular diameter. No prosthetic valve stenosis noted. of the aortic prosthesis. No thrombus seen. (Patient known to have subtherapeutic INR). ? ?Recent labs: ? ?  Latest Ref Rng & Units 06/19/2021  ?  8:00 AM 05/10/2021  ?  9:50 AM 11/30/2020  ? 12:00 AM  ?CMP  ?Glucose 70 - 99 mg/dL 102   96     ?BUN 6 - 23 mg/dL 25   21   30        ?Creatinine 0.40 - 1.20 mg/dL 1.48   1.41  1.4       ?Sodium 135 - 145 mEq/L 135   134   139       ?Potassium 3.5 - 5.1 mEq/L 4.1   4.5   4.5       ?Chloride 96 - 112 mEq/L 102   101   105       ?CO2 19 - 32 mEq/L 25   26   25        ?Calcium 8.4 - 10.5 mg/dL 9.1   9.6   9.5       ?Total Protein 6.0 - 8.3 g/dL 7.4   7.6     ?Total Bilirubin 0.2 - 1.2 mg/dL 0.3   0.4     ?Alkaline Phos 39 - 117 U/L 98   90     ?AST 0 - 37 U/L 32   40     ?ALT 0 - 35 U/L 42   57     ?  ? This result is from an external source.  ? ? ?  Latest Ref Rng & Units 05/10/2021  ?  9:50 AM 07/03/2020  ?  9:45 AM 07/28/2018  ?  7:13 AM  ?CBC  ?WBC 4.0 - 10.5 K/uL 6.6   6.9   7.9    ?Hemoglobin 12.0 - 15.0 g/dL 11.6   11.9   13.2    ?Hematocrit 36.0 - 46.0 % 35.8   36.8   40.4    ?Platelets 150.0 - 400.0 K/uL 221.0   199   183    ? ?Lipid Panel  ?   ?Component Value Date/Time  ? CHOL 116 05/10/2021 0950  ? TRIG 115.0 05/10/2021 0950  ? HDL 45.50 05/10/2021 0950  ? CHOLHDL 3 05/10/2021 0950  ? VLDL 23.0 05/10/2021 0950  ? LDLCALC 48 05/10/2021 0950  ? ?HEMOGLOBIN A1C ?Lab Results  ?Component Value Date  ? HGBA1C 6.4 05/10/2021  ? ?TSH ?Recent Labs  ?  07/03/20 ?0944  ?TSH 2.870  ? ? ?April-May 2022: ?Glucose 129, BUN/Cr 25/1.09. eGFR 59. Na/K 136/4.7. Rest of the CMP  normal ?H/H 11.9/36.8. MCV 92. Platelets 199 ?TSH 2.8 normal ? ?02/16/2018: ?BNP 251 elevated ? ?Labs 07/02/2017: A1c 5.7%, HB 12.9/HCT 38.6, platelets 220, normal indicis.  Serum glucose 90 mg, BUN 23, creatini

## 2021-06-25 ENCOUNTER — Ambulatory Visit: Payer: Medicare Other | Admitting: Cardiology

## 2021-06-25 ENCOUNTER — Other Ambulatory Visit: Payer: Self-pay | Admitting: Cardiology

## 2021-06-25 DIAGNOSIS — R079 Chest pain, unspecified: Secondary | ICD-10-CM

## 2021-06-25 LAB — POCT INR: INR: 2.8 (ref 2.0–3.0)

## 2021-06-26 ENCOUNTER — Ambulatory Visit: Payer: Self-pay | Admitting: Pharmacist

## 2021-06-26 ENCOUNTER — Ambulatory Visit: Payer: Medicare Other

## 2021-06-26 DIAGNOSIS — Z7901 Long term (current) use of anticoagulants: Secondary | ICD-10-CM

## 2021-06-26 DIAGNOSIS — Z952 Presence of prosthetic heart valve: Secondary | ICD-10-CM

## 2021-06-26 DIAGNOSIS — Z5181 Encounter for therapeutic drug level monitoring: Secondary | ICD-10-CM | POA: Diagnosis not present

## 2021-06-26 NOTE — Progress Notes (Signed)
Anticoagulation Management ?Kimberly Hobbs is a 61 y.o. female who reports to the clinic for monitoring of warfarin treatment.   ? ?Indication: atrial fibrillation and Hx of Mechanical mitral and aortic valve replacement  ; CHA2DS2 Vasc Score 4 (Female, HTN, DM, Vascular disease hx), HAS-BLED 1 (ASA, prior bleed hx (hematuria, hematochezia) ? ?Duration: indefinite ?Supervising physician: Vernell Leep ? ?Anticoagulation Clinic Visit History: ? ?Patient does not report signs/symptoms of bleeding or thromboembolism  ? ?Other recent changes: No changes in diet, medications, lifestyle.  ? ?Pt maintaining steady Vit-K intake of spinach salad 3-4/week. ? ?Stable on amiodarone 100 mg daily.  ? ?No noted changes  ? ?Anticoagulation Episode Summary   ? ? Current INR goal:  2.5-3.5  ?TTR:  61.1 % (3.2 y)  ?Next INR check:  07/02/2021  ?INR from last check:  2.8 (06/25/2021)  ?Weekly max warfarin dose:    ?Target end date:  Indefinite  ?INR check location:    ?Preferred lab:    ?Send INR reminders to:    ? Indications   ?Status post mechanical aortic valve replacement [Z95.2] ?Monitoring for long-term anticoagulant use [Z51.81 ?Z79.01] ? ?  ?  ? ? Comments:    ?  ? ?  ? ? ?Allergies  ?Allergen Reactions  ? Iodinated Contrast Media Other (See Comments)  ?  Patient is unsure of reaction type  ? Penicillins Other (See Comments)  ?  Immune to drug , does not work per patient ? ?Did it involve swelling of the face/tongue/throat, SOB, or low BP? No ?Did it involve sudden or severe rash/hives, skin peeling, or any reaction on the inside of your mouth or nose? No ?Did you need to seek medical attention at a hospital or doctor's office? No ?When did it last happen? NOT A TRUE ALLERGY   ?If all above answers are ?NO?, may proceed with cephalosporin use. ?  ? ? ?Current Outpatient Medications:  ?  acetaminophen (TYLENOL) 500 MG tablet, Take 500 mg by mouth every 6 (six) hours as needed (for pain.)., Disp: , Rfl:  ?  albuterol  (VENTOLIN HFA) 108 (90 Base) MCG/ACT inhaler, Inhale 2 puffs into the lungs every 6 (six) hours as needed for wheezing or shortness of breath. , Disp: , Rfl:  ?  amiodarone (PACERONE) 200 MG tablet, Take 0.5 tablets (100 mg total) by mouth daily., Disp: 30 tablet, Rfl: 3 ?  aspirin EC 81 MG tablet, Take 81 mg by mouth daily. , Disp: , Rfl:  ?  citalopram (CELEXA) 40 MG tablet, Take 0.5 tablets (20 mg total) by mouth 2 (two) times daily., Disp: 90 tablet, Rfl: 3 ?  clonazePAM (KLONOPIN) 0.5 MG tablet, Take 1.5 tablets (0.75 mg total) by mouth at bedtime., Disp: 45 tablet, Rfl: 2 ?  diltiazem (CARDIZEM CD) 180 MG 24 hr capsule, TAKE 1 CAPSULE BY MOUTH DAILY, Disp: 90 capsule, Rfl: 2 ?  furosemide (LASIX) 20 MG tablet, TAKE 1 TABLET BY MOUTH DAILY, Disp: 90 tablet, Rfl: 2 ?  isosorbide mononitrate (IMDUR) 60 MG 24 hr tablet, TAKE 1 TABLET(60 MG) BY MOUTH DAILY, Disp: 90 tablet, Rfl: 3 ?  Lancets (ONETOUCH DELICA PLUS [id number]) MISC, TAKE AS DIRECTED ONCE DAILY., Disp: , Rfl:  ?  levalbuterol (XOPENEX HFA) 45 MCG/ACT inhaler, Inhale 1-2 puffs into the lungs every 6 (six) hours as needed for shortness of breath., Disp: 1 Inhaler, Rfl: 12 ?  losartan (COZAAR) 50 MG tablet, Take 1 tablet (50 mg total) by mouth daily., Disp: 90 tablet,  Rfl: 3 ?  metFORMIN (GLUCOPHAGE) 500 MG tablet, Take 1 tablet (500 mg total) by mouth 2 (two) times daily with a meal., Disp: 180 tablet, Rfl: 3 ?  metoprolol tartrate (LOPRESSOR) 25 MG tablet, Take 1 tablet (25 mg total) by mouth 2 (two) times daily., Disp: 60 tablet, Rfl: 3 ?  nitroGLYCERIN (NITROSTAT) 0.4 MG SL tablet, Place 1 tablet (0.4 mg total) under the tongue every 5 (five) minutes x 3 doses as needed for chest pain., Disp: 25 tablet, Rfl: 3 ?  ONETOUCH VERIO test strip, AS DIRECTED ONCE A DAY, Disp: , Rfl:  ?  pantoprazole (PROTONIX) 40 MG tablet, Take 1 tablet (40 mg total) by mouth daily., Disp: 90 tablet, Rfl: 0 ?  rosuvastatin (CRESTOR) 40 MG tablet, Take 1 tablet (40 mg total)  by mouth daily., Disp: 90 tablet, Rfl: 1 ?  spironolactone (ALDACTONE) 50 MG tablet, TAKE 1 TABLET(50 MG) BY MOUTH DAILY, Disp: 30 tablet, Rfl: 6 ?  STIOLTO RESPIMAT 2.5-2.5 MCG/ACT AERS, INHALE 2 PUFFS INTO THE LUNGS DAILY, Disp: 4 g, Rfl: 5 ?  warfarin (COUMADIN) 5 MG tablet, TAKE 1 TABLET BY MOUTH DAILY, Disp: 90 tablet, Rfl: 2 ?  warfarin (COUMADIN) 7.5 MG tablet, TAKE 1 TABLET BY MOUTH EVERY EVENING, Disp: 90 tablet, Rfl: 2 ?Past Medical History:  ?Diagnosis Date  ? CKD stage 3 due to type 2 diabetes mellitus ([place]) 05/15/2021  ? COVID-19   ? Hyperlipidemia   ? Hypertension   ? Rheumatic heart disease   ? MS/ AS  ? ? ?ASSESSMENT ? ?Recent Results: ?The most recent result is correlated with 50 mg per week: ? ?Lab Results  ?Component Value Date  ? INR 2.8 06/25/2021  ? INR 3.1 (A) 06/18/2021  ? INR 2.1 06/11/2021  ? ? ?Anticoagulation Dosing: ?Description   ?INR at goal. Continue weekly dose to 5 mg every Thurs, Sat and 7.5 mg all other days. Recheck INR in 1 week.  ?  ?  ?INR today: Therapeutic. INR improves following recent weekly dose increase. Denies any unusual bleeding or bruising symptoms. Denies any other relevant changes in diet, medications, or lifestyle. Amiodarone dose stable at 100 mg. Will continue current weekly dose and continue close monitoring and follow up as scheduled.  ? ?PLAN ?Weekly dose was unchanged by 0 % to 47.5 mg/week. Continue current weekly dose of 5 mg every Thurs, Sat and 7.5 mg all other days. Recheck INR in 1 week.  ? ?Patient Instructions  ?INR at goal. Continue weekly dose to 5 mg every Thurs, Sat and 7.5 mg all other days. Recheck INR in 1 week.  ?Patient advised to contact clinic or seek medical attention if signs/symptoms of bleeding or thromboembolism occur. ? ?Patient verbalized understanding by repeating back information and was advised to contact me if further medication-related questions arise.  ? ?Follow-up ?Return in about 1 week (around 07/03/2021). ? ?[name],  PharmD ? ?15 minutes spent face-to-face with the patient during the encounter. 50% of time spent on education, including signs/sx bleeding and clotting, as well as food and drug interactions with warfarin. 50% of time was spent on fingerprick POC INR sample collection,processing, results determination, and documentation ?

## 2021-06-26 NOTE — Patient Instructions (Signed)
INR at goal. Continue weekly dose to 5 mg every Thurs, Sat and 7.5 mg all other days. Recheck INR in 1 week.  ?

## 2021-06-27 ENCOUNTER — Telehealth: Payer: Self-pay | Admitting: Nurse Practitioner

## 2021-06-27 NOTE — Telephone Encounter (Signed)
Pt called to say she is in [place] with her mother and needs her clonazePAM (KLONOPIN) 0.5 MG tablet [facility] [place]. I noticed there is 2 refills on the most recent Rx in April.  ?

## 2021-06-28 ENCOUNTER — Telehealth: Payer: Self-pay

## 2021-06-28 NOTE — Telephone Encounter (Signed)
I called patient, Kimberly Hobbs, LMAM. Patient moved appointment to an earlier date : 07/02/21 Just FYI

## 2021-06-28 NOTE — Telephone Encounter (Signed)
[facility] [place] ? ?[address] [place], [place] [address] ?

## 2021-06-28 NOTE — Telephone Encounter (Signed)
DISREGARD LAST MSG... ? ?Pt called back saying she is going back home so to please send it to her regular pharmacy, [facility] in [place].  ?

## 2021-06-28 NOTE — Telephone Encounter (Signed)
She has known history of atrial fibrillation. Will await monitor results to assess a fib burden. Can keep June appt unless she develops worsening symptoms.

## 2021-06-28 NOTE — Telephone Encounter (Signed)
Patient called to let you know that her BPM has been going from 54 to 120, all while resting.  She wants to know if she needs to be seen sooner than June. Please advise.  ?

## 2021-07-02 ENCOUNTER — Ambulatory Visit: Payer: Medicare Other | Admitting: Student

## 2021-07-02 ENCOUNTER — Ambulatory Visit: Payer: Self-pay | Admitting: Pharmacist

## 2021-07-02 ENCOUNTER — Encounter: Payer: Self-pay | Admitting: Student

## 2021-07-02 VITALS — BP 107/59 | HR 47 | Temp 97.8°F | Resp 17 | Ht 65.0 in | Wt 212.4 lb

## 2021-07-02 DIAGNOSIS — Z952 Presence of prosthetic heart valve: Secondary | ICD-10-CM | POA: Diagnosis not present

## 2021-07-02 DIAGNOSIS — I480 Paroxysmal atrial fibrillation: Secondary | ICD-10-CM | POA: Diagnosis not present

## 2021-07-02 DIAGNOSIS — R002 Palpitations: Secondary | ICD-10-CM | POA: Diagnosis not present

## 2021-07-02 DIAGNOSIS — Z5181 Encounter for therapeutic drug level monitoring: Secondary | ICD-10-CM | POA: Diagnosis not present

## 2021-07-02 DIAGNOSIS — Z7901 Long term (current) use of anticoagulants: Secondary | ICD-10-CM

## 2021-07-02 LAB — POCT INR: INR: 3.1 — AB (ref 2.0–3.0)

## 2021-07-02 MED ORDER — AMIODARONE HCL 200 MG PO TABS
200.0000 mg | ORAL_TABLET | Freq: Every day | ORAL | 3 refills | Status: DC
Start: 2021-07-02 — End: 2021-07-06

## 2021-07-02 NOTE — Progress Notes (Signed)
? ? ?Subjective:  ? ?Kimberly Hobbs, female    DOB: 08-17-1960, 61 y.o.   MRN: [id number] ? ? ?Chief complaint:  ?Irregular heart beat ? ?61 y.o. 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 ? ?Patient presents for urgent visit at her request with concerns that she has been in atrial fibrillation for the last 4 days.  Patient states starting Thursday last week she began to notice her heart rate going up and down rapidly with mild shortness of breath as well as intermittent nausea.  Denies chest pain, dizziness, syncope, near syncope.  She continues to tolerate anticoagulation without bleeding diathesis.  At last office visit ordered repeat cardiac monitor to evaluate atrial fibrillation burden, unfortunately results are not available at this time. ? ?Current Outpatient Medications on File Prior to Visit  ?Medication Sig Dispense Refill  ? acetaminophen (TYLENOL) 500 MG tablet Take 500 mg by mouth every 6 (six) hours as needed (for pain.).    ? albuterol (VENTOLIN HFA) 108 (90 Base) MCG/ACT inhaler Inhale 2 puffs into the lungs every 6 (six) hours as needed for wheezing or shortness of breath.     ? aspirin EC 81 MG tablet Take 81 mg by mouth daily.     ? citalopram (CELEXA) 40 MG tablet Take 0.5 tablets (20 mg total) by mouth 2 (two) times daily. 90 tablet 3  ? clonazePAM (KLONOPIN) 0.5 MG tablet Take 1.5 tablets (0.75 mg total) by mouth at bedtime. 45 tablet 2  ? diltiazem (CARDIZEM CD) 180 MG 24 hr capsule TAKE 1 CAPSULE BY MOUTH DAILY 90 capsule 2  ? furosemide (LASIX) 20 MG tablet TAKE 1 TABLET BY MOUTH DAILY 90 tablet 2  ? isosorbide mononitrate (IMDUR) 60 MG 24 hr tablet TAKE 1 TABLET(60 MG) BY MOUTH DAILY 90 tablet 3  ? Lancets (ONETOUCH DELICA PLUS [id number]) MISC TAKE AS DIRECTED ONCE DAILY.    ? levalbuterol  (XOPENEX HFA) 45 MCG/ACT inhaler Inhale 1-2 puffs into the lungs every 6 (six) hours as needed for shortness of breath. 1 Inhaler 12  ? losartan (COZAAR) 50 MG tablet Take 1 tablet (50 mg total) by mouth daily. 90 tablet 3  ? metFORMIN (GLUCOPHAGE) 500 MG tablet Take 1 tablet (500 mg total) by mouth 2 (two) times daily with a meal. 180 tablet 3  ? metoprolol tartrate (LOPRESSOR) 25 MG tablet Take 1 tablet (25 mg total) by mouth 2 (two) times daily. 60 tablet 3  ? nitroGLYCERIN (NITROSTAT) 0.4 MG SL tablet Place 1 tablet (0.4 mg total) under the tongue every 5 (five) minutes x 3 doses as needed for chest pain. 25 tablet 3  ? ONETOUCH VERIO test strip AS DIRECTED ONCE A DAY    ? pantoprazole (PROTONIX) 40 MG tablet Take 1 tablet (40 mg total) by mouth daily. 90 tablet 0  ? rosuvastatin (CRESTOR) 40 MG tablet Take 1 tablet (40 mg total) by mouth daily. 90 tablet 1  ? spironolactone (ALDACTONE) 50 MG tablet TAKE 1 TABLET(50 MG) BY MOUTH DAILY 30 tablet 6  ? STIOLTO RESPIMAT 2.5-2.5 MCG/ACT AERS INHALE 2 PUFFS INTO THE LUNGS DAILY 4 g 5  ? warfarin (COUMADIN) 5 MG tablet TAKE 1 TABLET BY MOUTH DAILY 90 tablet 2  ? warfarin (COUMADIN) 7.5 MG tablet TAKE 1 TABLET BY MOUTH EVERY EVENING (Patient taking differently: TAKE 1 TABLET BY  MOUTH EVERY EVENING Or as directed by coumadin clinic) 90 tablet 2  ? ?No current facility-administered medications on file prior to visit.  ? ? ?Cardiovascular studies: ?EKG 07/02/2021:  ?Atrial fibrillation with controlled ventricular response at a rate of 92 bpm. ? ?EKG 06/20/2021:  ?Sinus rhythm at a rate of 70 bpm.  Left axis, left anterior fascicular block. ? ?Echocardiogram 06/01/2020:  ?Normal LV systolic function with EF 55%. Left ventricle cavity is normal  ?in size. Normal global wall motion. Calculated EF 55%.  ?Left atrial cavity is moderately dilated.  ?Well seated mechanical aortic valve.  mean PG 22 mmHg with acceleration  ?time <100 msec. Above findings similar to previous TTE and  TEE in 2020,  ?and suggest patient prosthesis mismatch in 19 mm St Jude valve, rather  ?than prosthetic valve stenosis. DVI of 0.18 is likely erroneous due  ?improper LV velocity measurement.  Mild (Grade I) aortic regurgitation.  ?Well seated, well functioning 25 mm Regent mechanical mitral valve. Mean  ?PG 3 mmHg, which is normal. Trace mitral regurgitation.  ?Estimated PASP 31 mmHg.  ?No significant change compared to previous transthoracic and  ?transesophageal studies in 2020. ? ?EKG 01/22/2021: ?Probable sinus bradycardia 46 bpm ?LAFB ? ?Coronary and bypass graft angiography 07/21/2018 and 08/18/2018: ?LM: Normal ?LAD: 100% mid occlusion. Mild distal disease ?LIMA-LAD: Patent ?LCx: Normal ?RCA: Prox 60% stenosis, mid 30-40% stenoses. TIMI III flow in prox-mid RCA. 100% distal occlusion ?SVG-RCA: Patent. Ostial 40%. Distal RCA moderate diffuse disease.  ?  ?Guide catheter angiography provided superior images. There is TIMI III flow in SVG which fills RCA all the way to the ostium. Even if she were to have FFR positive lesion in the graft, I do not think the benefits of a stent would outweigh the risk of losing the graft and the entire RCA territory circulation in a borderline lesion. Also, the prox RCA lesion is well bypassed by the SVG graft. Thus, I decided not to perform FFR/PCI to either of these lesions. Continue medical management.  ? ?[place] 07/21/2018: ?#1: ?RA: 18 mmHg ?RV 90/12 mmHg ?PA: 94/40 mmHg. Mean PA 63 mmHg. ?PW 33 mmHg ? ?Lasix 80 mg ? ?#2: ?RA 19 mmHg ?RV 60/0 mmHg ?PA: 56/16 mmHg. Mean PA 36 mmHg ?PW: 25 mmHg ? ?CO: 5.4 L/min. CI 2.6 L/min/m2 ?PVR 2 WU ? ?Impression: ?Elevated filling pressures ?Moderate WHO Grp II pulmonary hypertension ?Improvement in filling pressures with diuresis. ? ?TEE 07/21/2018: ? 1. The left ventricle has normal systolic function, with an ejection fraction of 55-60%. There is abnormal septal motion consistent with post-operative status. ? 2. The right ventricle has normal  systolc function. ? 3. A 25 mm Regent mechanical valve is present in the mitral position. Procedure Date: 2018 Echo findings are consistent with is functioning normally the mitral prosthesis. Normal mitral valve prosthesis. Mean PG 6 mmHg, MVA 1.7 cm2. No thrombus seen.  ?(Patient known to have subtherapeutic INR). ? 4. A 43m St. Jude mechanical prosthesis valve is present in the aortic position. Procedure Date: 2018 Normal aortic valve prosthesis. Echo findings shows no evidence of Accelration time of 88 msec suggests normal functioning valve. Mean PG 25 mmHg  ?likely due to small annular diameter. No prosthetic valve stenosis noted. of the aortic prosthesis. No thrombus seen. (Patient known to have subtherapeutic INR). ? ?Recent labs: ? ?  Latest Ref Rng & Units 06/19/2021  ?  8:00 AM 05/10/2021  ?  9:50 AM 11/30/2020  ? 12:00 AM  ?CMP  ?  Glucose 70 - 99 mg/dL 102   96     ?BUN 6 - 23 mg/dL 25   21   30        ?Creatinine 0.40 - 1.20 mg/dL 1.48   1.41   1.4       ?Sodium 135 - 145 mEq/L 135   134   139       ?Potassium 3.5 - 5.1 mEq/L 4.1   4.5   4.5       ?Chloride 96 - 112 mEq/L 102   101   105       ?CO2 19 - 32 mEq/L 25   26   25        ?Calcium 8.4 - 10.5 mg/dL 9.1   9.6   9.5       ?Total Protein 6.0 - 8.3 g/dL 7.4   7.6     ?Total Bilirubin 0.2 - 1.2 mg/dL 0.3   0.4     ?Alkaline Phos 39 - 117 U/L 98   90     ?AST 0 - 37 U/L 32   40     ?ALT 0 - 35 U/L 42   57     ?  ? This result is from an external source.  ? ? ?  Latest Ref Rng & Units 05/10/2021  ?  9:50 AM 07/03/2020  ?  9:45 AM 07/28/2018  ?  7:13 AM  ?CBC  ?WBC 4.0 - 10.5 K/uL 6.6   6.9   7.9    ?Hemoglobin 12.0 - 15.0 g/dL 11.6   11.9   13.2    ?Hematocrit 36.0 - 46.0 % 35.8   36.8   40.4    ?Platelets 150.0 - 400.0 K/uL 221.0   199   183    ? ?Lipid Panel  ?   ?Component Value Date/Time  ? CHOL 116 05/10/2021 0950  ? TRIG 115.0 05/10/2021 0950  ? HDL 45.50 05/10/2021 0950  ? CHOLHDL 3 05/10/2021 0950  ? VLDL 23.0 05/10/2021 0950  ? LDLCALC 48 05/10/2021  0950  ? ?HEMOGLOBIN A1C ?Lab Results  ?Component Value Date  ? HGBA1C 6.4 05/10/2021  ? ?TSH ?Recent Labs  ?  07/03/20 ?0944  ?TSH 2.870  ? ? ?April-May 2022: ?Glucose 129, BUN/Cr 25/1.09. eGFR 59. Na/K 13

## 2021-07-02 NOTE — Progress Notes (Signed)
Anticoagulation Management ?Kimberly Hobbs is a 61 y.o. female who reports to the clinic for monitoring of warfarin treatment.   ? ?Indication: atrial fibrillation and Hx of Mechanical mitral and aortic valve replacement  ; CHA2DS2 Vasc Score 4 (Female, HTN, DM, Vascular disease hx), HAS-BLED 1 (ASA, prior bleed hx (hematuria, hematochezia) ? ?Duration: indefinite ?Supervising physician: Vernell Leep ? ?Anticoagulation Clinic Visit History: ? ?Patient does not report signs/symptoms of bleeding or thromboembolism  ? ?Other recent changes: No changes in diet, medications, lifestyle.  ? ?Pt maintaining steady Vit-K intake of spinach salad 3-4/week. ? ?Stable on amiodarone 100 mg daily.  ? ?No noted changes  ? ?Anticoagulation Episode Summary   ? ? Current INR goal:  2.5-3.5  ?TTR:  61.3 % (3.2 y)  ?Next INR check:  07/09/2021  ?INR from last check:  3.1 (07/02/2021)  ?Weekly max warfarin dose:    ?Target end date:  Indefinite  ?INR check location:    ?Preferred lab:    ?Send INR reminders to:    ? Indications   ?Status post mechanical aortic valve replacement [Z95.2] ?Monitoring for long-term anticoagulant use [Z51.81 ?Z79.01] ? ?  ?  ? ? Comments:    ?  ? ?  ? ? ?Allergies  ?Allergen Reactions  ? Iodinated Contrast Media Other (See Comments)  ?  Patient is unsure of reaction type  ? Penicillins Other (See Comments)  ?  Immune to drug , does not work per patient ? ?Did it involve swelling of the face/tongue/throat, SOB, or low BP? No ?Did it involve sudden or severe rash/hives, skin peeling, or any reaction on the inside of your mouth or nose? No ?Did you need to seek medical attention at a hospital or doctor's office? No ?When did it last happen? NOT A TRUE ALLERGY   ?If all above answers are ?NO?, may proceed with cephalosporin use. ?  ? ? ?Current Outpatient Medications:  ?  acetaminophen (TYLENOL) 500 MG tablet, Take 500 mg by mouth every 6 (six) hours as needed (for pain.)., Disp: , Rfl:  ?  albuterol  (VENTOLIN HFA) 108 (90 Base) MCG/ACT inhaler, Inhale 2 puffs into the lungs every 6 (six) hours as needed for wheezing or shortness of breath. , Disp: , Rfl:  ?  amiodarone (PACERONE) 200 MG tablet, Take 1 tablet (200 mg total) by mouth daily., Disp: 30 tablet, Rfl: 3 ?  aspirin EC 81 MG tablet, Take 81 mg by mouth daily. , Disp: , Rfl:  ?  citalopram (CELEXA) 40 MG tablet, Take 0.5 tablets (20 mg total) by mouth 2 (two) times daily., Disp: 90 tablet, Rfl: 3 ?  clonazePAM (KLONOPIN) 0.5 MG tablet, Take 1.5 tablets (0.75 mg total) by mouth at bedtime., Disp: 45 tablet, Rfl: 2 ?  diltiazem (CARDIZEM CD) 180 MG 24 hr capsule, TAKE 1 CAPSULE BY MOUTH DAILY, Disp: 90 capsule, Rfl: 2 ?  furosemide (LASIX) 20 MG tablet, TAKE 1 TABLET BY MOUTH DAILY, Disp: 90 tablet, Rfl: 2 ?  isosorbide mononitrate (IMDUR) 60 MG 24 hr tablet, TAKE 1 TABLET(60 MG) BY MOUTH DAILY, Disp: 90 tablet, Rfl: 3 ?  Lancets (ONETOUCH DELICA PLUS [id number]) MISC, TAKE AS DIRECTED ONCE DAILY., Disp: , Rfl:  ?  levalbuterol (XOPENEX HFA) 45 MCG/ACT inhaler, Inhale 1-2 puffs into the lungs every 6 (six) hours as needed for shortness of breath., Disp: 1 Inhaler, Rfl: 12 ?  losartan (COZAAR) 50 MG tablet, Take 1 tablet (50 mg total) by mouth daily., Disp: 90 tablet,  Rfl: 3 ?  metFORMIN (GLUCOPHAGE) 500 MG tablet, Take 1 tablet (500 mg total) by mouth 2 (two) times daily with a meal., Disp: 180 tablet, Rfl: 3 ?  metoprolol tartrate (LOPRESSOR) 25 MG tablet, Take 1 tablet (25 mg total) by mouth 2 (two) times daily., Disp: 60 tablet, Rfl: 3 ?  nitroGLYCERIN (NITROSTAT) 0.4 MG SL tablet, Place 1 tablet (0.4 mg total) under the tongue every 5 (five) minutes x 3 doses as needed for chest pain., Disp: 25 tablet, Rfl: 3 ?  ONETOUCH VERIO test strip, AS DIRECTED ONCE A DAY, Disp: , Rfl:  ?  pantoprazole (PROTONIX) 40 MG tablet, Take 1 tablet (40 mg total) by mouth daily., Disp: 90 tablet, Rfl: 0 ?  rosuvastatin (CRESTOR) 40 MG tablet, Take 1 tablet (40 mg total) by  mouth daily., Disp: 90 tablet, Rfl: 1 ?  spironolactone (ALDACTONE) 50 MG tablet, TAKE 1 TABLET(50 MG) BY MOUTH DAILY, Disp: 30 tablet, Rfl: 6 ?  STIOLTO RESPIMAT 2.5-2.5 MCG/ACT AERS, INHALE 2 PUFFS INTO THE LUNGS DAILY, Disp: 4 g, Rfl: 5 ?  warfarin (COUMADIN) 5 MG tablet, TAKE 1 TABLET BY MOUTH DAILY, Disp: 90 tablet, Rfl: 2 ?  warfarin (COUMADIN) 7.5 MG tablet, TAKE 1 TABLET BY MOUTH EVERY EVENING (Patient taking differently: TAKE 1 TABLET BY MOUTH EVERY EVENING Or as directed by coumadin clinic), Disp: 90 tablet, Rfl: 2 ?Past Medical History:  ?Diagnosis Date  ? CKD stage 3 due to type 2 diabetes mellitus ([place]) 05/15/2021  ? COVID-19   ? Hyperlipidemia   ? Hypertension   ? Rheumatic heart disease   ? MS/ AS  ? ? ?ASSESSMENT ? ?Recent Results: ?The most recent result is correlated with 50 mg per week: ? ?Lab Results  ?Component Value Date  ? INR 3.1 (A) 07/02/2021  ? INR 2.8 06/25/2021  ? INR 3.1 (A) 06/18/2021  ? ? ?Anticoagulation Dosing: ?Description   ?INR at goal. Continue weekly dose to 5 mg every Thurs, Sat and 7.5 mg all other days. Recheck INR in 1 week.  ?  ?  ?INR today: Therapeutic. INR improves following recent weekly dose increase. Denies any unusual bleeding or bruising symptoms. Denies any other relevant changes in diet, medications, or lifestyle. Amiodarone dose stable at 100 mg. Will continue current weekly dose and continue close monitoring and follow up as scheduled.  ? ?PLAN ?Weekly dose was unchanged by 0 % to 47.5 mg/week. Continue current weekly dose of 5 mg every Thurs, Sat and 7.5 mg all other days. Recheck INR in 1 week.  ? ?Patient Instructions  ?INR at goal. Continue weekly dose to 5 mg every Thurs, Sat and 7.5 mg all other days. Recheck INR in 1 week.  ?Patient advised to contact clinic or seek medical attention if signs/symptoms of bleeding or thromboembolism occur. ? ?Patient verbalized understanding by repeating back information and was advised to contact me if further  medication-related questions arise.  ? ?Follow-up ?Return in about 1 week (around 07/09/2021). ? ?[name], PharmD ? ?15 minutes spent face-to-face with the patient during the encounter. 50% of time spent on education, including signs/sx bleeding and clotting, as well as food and drug interactions with warfarin. 50% of time was spent on fingerprick POC INR sample collection,processing, results determination, and documentation ?

## 2021-07-02 NOTE — Patient Instructions (Signed)
INR at goal. Continue weekly dose to 5 mg every Thurs, Sat and 7.5 mg all other days. Recheck INR in 1 week.  ?

## 2021-07-02 NOTE — Progress Notes (Signed)
? ? ?Subjective:  ? ?Kimberly Hobbs, female    DOB: 01-17-1961, 61 y.o.   MRN: [id number] ? ? ?Chief complaint:  ?Irregular heart beat ? ?61 y.o. 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 ? ?Patient presents for 3 month follow up.  At last office visit patient was in sinus bradycardia, therefore stopped Lopressor 50 mg and reduced amiodarone from 200 mg to 100 mg daily.  Patient now presents today with episodes of palpitations occurring multiple times daily lasting several seconds feeling like her heart "skips a beat".  Patient expresses concern she is having recurrent episodes of atrial fibrillation, however symptoms appear more consistent with PACs or PVCs.  Denies chest pain, dyspnea, dizziness, syncope, near syncope. ? ?Current Outpatient Medications on File Prior to Visit  ?Medication Sig Dispense Refill  ? acetaminophen (TYLENOL) 500 MG tablet Take 500 mg by mouth every 6 (six) hours as needed (for pain.).    ? albuterol (VENTOLIN HFA) 108 (90 Base) MCG/ACT inhaler Inhale 2 puffs into the lungs every 6 (six) hours as needed for wheezing or shortness of breath.     ? amiodarone (PACERONE) 200 MG tablet Take 0.5 tablets (100 mg total) by mouth daily. 30 tablet 3  ? aspirin EC 81 MG tablet Take 81 mg by mouth daily.     ? citalopram (CELEXA) 40 MG tablet Take 0.5 tablets (20 mg total) by mouth 2 (two) times daily. 90 tablet 3  ? clonazePAM (KLONOPIN) 0.5 MG tablet Take 1.5 tablets (0.75 mg total) by mouth at bedtime. 45 tablet 2  ? diltiazem (CARDIZEM CD) 180 MG 24 hr capsule TAKE 1 CAPSULE BY MOUTH DAILY 90 capsule 2  ? furosemide (LASIX) 20 MG tablet TAKE 1 TABLET BY MOUTH DAILY 90 tablet 2  ? isosorbide mononitrate (IMDUR) 60 MG 24 hr tablet TAKE 1 TABLET(60 MG) BY MOUTH DAILY 90 tablet 3  ? Lancets (ONETOUCH  DELICA PLUS [id number]) MISC TAKE AS DIRECTED ONCE DAILY.    ? levalbuterol (XOPENEX HFA) 45 MCG/ACT inhaler Inhale 1-2 puffs into the lungs every 6 (six) hours as needed for shortness of breath. 1 Inhaler 12  ? losartan (COZAAR) 50 MG tablet Take 1 tablet (50 mg total) by mouth daily. 90 tablet 3  ? metFORMIN (GLUCOPHAGE) 500 MG tablet Take 1 tablet (500 mg total) by mouth 2 (two) times daily with a meal. 180 tablet 3  ? metoprolol tartrate (LOPRESSOR) 25 MG tablet Take 1 tablet (25 mg total) by mouth 2 (two) times daily. 60 tablet 3  ? nitroGLYCERIN (NITROSTAT) 0.4 MG SL tablet Place 1 tablet (0.4 mg total) under the tongue every 5 (five) minutes x 3 doses as needed for chest pain. 25 tablet 3  ? ONETOUCH VERIO test strip AS DIRECTED ONCE A DAY    ? pantoprazole (PROTONIX) 40 MG tablet Take 1 tablet (40 mg total) by mouth daily. 90 tablet 0  ? rosuvastatin (CRESTOR) 40 MG tablet Take 1 tablet (40 mg total) by mouth daily. 90 tablet 1  ? spironolactone (ALDACTONE) 50 MG tablet TAKE 1 TABLET(50 MG) BY MOUTH DAILY 30 tablet 6  ? STIOLTO RESPIMAT 2.5-2.5 MCG/ACT AERS INHALE 2 PUFFS INTO THE LUNGS DAILY 4 g 5  ? warfarin (COUMADIN) 5 MG tablet TAKE 1 TABLET BY MOUTH DAILY 90 tablet 2  ? warfarin (COUMADIN) 7.5 MG tablet  TAKE 1 TABLET BY MOUTH EVERY EVENING (Patient taking differently: TAKE 1 TABLET BY MOUTH EVERY EVENING Or as directed by coumadin clinic) 90 tablet 2  ? ?No current facility-administered medications on file prior to visit.  ? ? ?Cardiovascular studies: ?EKG 06/20/2021:  ?Sinus rhythm at a rate of 70 bpm.  Left axis, left anterior fascicular block. ? ?Echocardiogram 06/01/2020:  ?Normal LV systolic function with EF 55%. Left ventricle cavity is normal  ?in size. Normal global wall motion. Calculated EF 55%.  ?Left atrial cavity is moderately dilated.  ?Well seated mechanical aortic valve.  mean PG 22 mmHg with acceleration  ?time <100 msec. Above findings similar to previous TTE and TEE in 2020,  ?and  suggest patient prosthesis mismatch in 19 mm St Jude valve, rather  ?than prosthetic valve stenosis. DVI of 0.18 is likely erroneous due  ?improper LV velocity measurement.  Mild (Grade I) aortic regurgitation.  ?Well seated, well functioning 25 mm Regent mechanical mitral valve. Mean  ?PG 3 mmHg, which is normal. Trace mitral regurgitation.  ?Estimated PASP 31 mmHg.  ?No significant change compared to previous transthoracic and  ?transesophageal studies in 2020. ? ?EKG 01/22/2021: ?Probable sinus bradycardia 46 bpm ?LAFB ? ?Coronary and bypass graft angiography 07/21/2018 and 08/18/2018: ?LM: Normal ?LAD: 100% mid occlusion. Mild distal disease ?LIMA-LAD: Patent ?LCx: Normal ?RCA: Prox 60% stenosis, mid 30-40% stenoses. TIMI III flow in prox-mid RCA. 100% distal occlusion ?SVG-RCA: Patent. Ostial 40%. Distal RCA moderate diffuse disease.  ?  ?Guide catheter angiography provided superior images. There is TIMI III flow in SVG which fills RCA all the way to the ostium. Even if she were to have FFR positive lesion in the graft, I do not think the benefits of a stent would outweigh the risk of losing the graft and the entire RCA territory circulation in a borderline lesion. Also, the prox RCA lesion is well bypassed by the SVG graft. Thus, I decided not to perform FFR/PCI to either of these lesions. Continue medical management.  ? ?[place] 07/21/2018: ?#1: ?RA: 18 mmHg ?RV 90/12 mmHg ?PA: 94/40 mmHg. Mean PA 63 mmHg. ?PW 33 mmHg ? ?Lasix 80 mg ? ?#2: ?RA 19 mmHg ?RV 60/0 mmHg ?PA: 56/16 mmHg. Mean PA 36 mmHg ?PW: 25 mmHg ? ?CO: 5.4 L/min. CI 2.6 L/min/m2 ?PVR 2 WU ? ?Impression: ?Elevated filling pressures ?Moderate WHO Grp II pulmonary hypertension ?Improvement in filling pressures with diuresis. ? ?TEE 07/21/2018: ? 1. The left ventricle has normal systolic function, with an ejection fraction of 55-60%. There is abnormal septal motion consistent with post-operative status. ? 2. The right ventricle has normal systolc  function. ? 3. A 25 mm Regent mechanical valve is present in the mitral position. Procedure Date: 2018 Echo findings are consistent with is functioning normally the mitral prosthesis. Normal mitral valve prosthesis. Mean PG 6 mmHg, MVA 1.7 cm2. No thrombus seen.  ?(Patient known to have subtherapeutic INR). ? 4. A 77m St. Jude mechanical prosthesis valve is present in the aortic position. Procedure Date: 2018 Normal aortic valve prosthesis. Echo findings shows no evidence of Accelration time of 88 msec suggests normal functioning valve. Mean PG 25 mmHg  ?likely due to small annular diameter. No prosthetic valve stenosis noted. of the aortic prosthesis. No thrombus seen. (Patient known to have subtherapeutic INR). ? ?Recent labs: ? ?  Latest Ref Rng & Units 06/19/2021  ?  8:00 AM 05/10/2021  ?  9:50 AM 11/30/2020  ? 12:00 AM  ?CMP  ?Glucose 70 -  99 mg/dL 102   96     ?BUN 6 - 23 mg/dL 25   21   30        ?Creatinine 0.40 - 1.20 mg/dL 1.48   1.41   1.4       ?Sodium 135 - 145 mEq/L 135   134   139       ?Potassium 3.5 - 5.1 mEq/L 4.1   4.5   4.5       ?Chloride 96 - 112 mEq/L 102   101   105       ?CO2 19 - 32 mEq/L 25   26   25        ?Calcium 8.4 - 10.5 mg/dL 9.1   9.6   9.5       ?Total Protein 6.0 - 8.3 g/dL 7.4   7.6     ?Total Bilirubin 0.2 - 1.2 mg/dL 0.3   0.4     ?Alkaline Phos 39 - 117 U/L 98   90     ?AST 0 - 37 U/L 32   40     ?ALT 0 - 35 U/L 42   57     ?  ? This result is from an external source.  ? ? ? ?  Latest Ref Rng & Units 05/10/2021  ?  9:50 AM 07/03/2020  ?  9:45 AM 07/28/2018  ?  7:13 AM  ?CBC  ?WBC 4.0 - 10.5 K/uL 6.6   6.9   7.9    ?Hemoglobin 12.0 - 15.0 g/dL 11.6   11.9   13.2    ?Hematocrit 36.0 - 46.0 % 35.8   36.8   40.4    ?Platelets 150.0 - 400.0 K/uL 221.0   199   183    ? ?Lipid Panel  ?   ?Component Value Date/Time  ? CHOL 116 05/10/2021 0950  ? TRIG 115.0 05/10/2021 0950  ? HDL 45.50 05/10/2021 0950  ? CHOLHDL 3 05/10/2021 0950  ? VLDL 23.0 05/10/2021 0950  ? LDLCALC 48 05/10/2021 0950   ? ?HEMOGLOBIN A1C ?Lab Results  ?Component Value Date  ? HGBA1C 6.4 05/10/2021  ? ?TSH ?Recent Labs  ?  07/03/20 ?0944  ?TSH 2.870  ? ? ? ?April-May 2022: ?Glucose 129, BUN/Cr 25/1.09. eGFR 59. Na/K 136/4.7. Rest of the

## 2021-07-03 DIAGNOSIS — I480 Paroxysmal atrial fibrillation: Secondary | ICD-10-CM | POA: Diagnosis not present

## 2021-07-03 DIAGNOSIS — R002 Palpitations: Secondary | ICD-10-CM | POA: Diagnosis not present

## 2021-07-04 ENCOUNTER — Other Ambulatory Visit: Payer: Self-pay | Admitting: Cardiology

## 2021-07-05 ENCOUNTER — Encounter: Payer: Self-pay | Admitting: Cardiology

## 2021-07-05 NOTE — Telephone Encounter (Signed)
From pt

## 2021-07-05 NOTE — Telephone Encounter (Signed)
Yes. Continue current meds. No Afib on recent monitor, although cannot exclude this current episode is indeed Afib. Rate is acceptable. Keep appt with CC next month.  Thanks MJP

## 2021-07-06 ENCOUNTER — Ambulatory Visit: Payer: Medicare Other | Admitting: Student

## 2021-07-06 ENCOUNTER — Telehealth: Payer: Self-pay

## 2021-07-06 VITALS — BP 108/72 | HR 112

## 2021-07-06 DIAGNOSIS — I480 Paroxysmal atrial fibrillation: Secondary | ICD-10-CM | POA: Diagnosis not present

## 2021-07-06 MED ORDER — AMIODARONE HCL 200 MG PO TABS: 200.0000 mg | ORAL_TABLET | Freq: Every day | ORAL | 3 refills | Status: DC

## 2021-07-06 NOTE — Telephone Encounter (Signed)
Patient called and stated that her heart rate has been as high as 152 resting. Patient also stated that she has been in Afib for about a week now, and BP's  BP 150/91 152 BP 98/74    82

## 2021-07-06 NOTE — Progress Notes (Signed)
EKG 07/06/2021: Atrial fibrillation 129 bpm Right bundle branch block Left axis -bifascicular block  Old anteroseptal infarct

## 2021-07-06 NOTE — Progress Notes (Signed)
  Subjective:   Kimberly Hobbs, female    DOB: 04/27/1960, 61 y.o.   MRN: [id number]   Chief complaint:  Irregular heart beat  61 y.o. 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  Patient was seen 07/02/2021 with recurrence of atrial fibrillation at which time amiodarone was increased from 100 mg to 200 mg daily and Lopressor 25 mg p.o. twice daily was resumed.  Patient presents again for urgent visit today as she remains in atrial fibrillation with elevated heart rate.  EKG today reveals atrial fibrillation with a ventricular response of 130 bpm.  Notably she has only been taking Lopressor 25 mg p.o. once daily instead of twice daily.  She continues to tolerate anticoagulation without bleeding diathesis.  She has noticed intermittent dizziness and mild dyspnea on exertion over the last 2 days.  Denies chest pain, syncope, near syncope.  Current Outpatient Medications on File Prior to Visit  Medication Sig Dispense Refill   acetaminophen (TYLENOL) 500 MG tablet Take 500 mg by mouth every 6 (six) hours as needed (for pain.).     albuterol (VENTOLIN HFA) 108 (90 Base) MCG/ACT inhaler Inhale 2 puffs into the lungs every 6 (six) hours as needed for wheezing or shortness of breath.      aspirin EC 81 MG tablet Take 81 mg by mouth daily.      citalopram (CELEXA) 40 MG tablet Take 0.5 tablets (20 mg total) by mouth 2 (two) times daily. 90 tablet 3   clonazePAM (KLONOPIN) 0.5 MG tablet Take 1.5 tablets (0.75 mg total) by mouth at bedtime. 45 tablet 2   diltiazem (CARDIZEM CD) 180 MG 24 hr capsule TAKE 1 CAPSULE BY MOUTH DAILY 90 capsule 2   furosemide (LASIX) 20 MG tablet TAKE 1 TABLET BY MOUTH DAILY 90 tablet 2   isosorbide mononitrate (IMDUR) 60 MG 24 hr tablet TAKE 1 TABLET(60 MG) BY MOUTH DAILY 90  tablet 3   Lancets (ONETOUCH DELICA PLUS LANCET30G) MISC TAKE AS DIRECTED ONCE DAILY.     levalbuterol (XOPENEX HFA) 45 MCG/ACT inhaler Inhale 1-2 puffs into the lungs every 6 (six) hours as needed for shortness of breath. 1 Inhaler 12   losartan (COZAAR) 50 MG tablet Take 1 tablet (50 mg total) by mouth daily. 90 tablet 3   metFORMIN (GLUCOPHAGE) 500 MG tablet Take 1 tablet (500 mg total) by mouth 2 (two) times daily with a meal. 180 tablet 3   metoprolol tartrate (LOPRESSOR) 25 MG tablet Take 1 tablet (25 mg total) by mouth 2 (two) times daily. 60 tablet 3   nitroGLYCERIN (NITROSTAT) 0.4 MG SL tablet Place 1 tablet (0.4 mg total) under the tongue every 5 (five) minutes x 3 doses as needed for chest pain. 25 tablet 3   ONETOUCH VERIO test strip AS DIRECTED ONCE A DAY     pantoprazole (PROTONIX) 40 MG tablet Take 1 tablet (40 mg total) by mouth daily. 90 tablet 0   rosuvastatin (CRESTOR) 40 MG tablet Take 1 tablet (40 mg total) by mouth daily. 90 tablet 1   spironolactone (ALDACTONE) 50 MG tablet TAKE 1 TABLET(50 MG) BY MOUTH DAILY 30 tablet 6   STIOLTO RESPIMAT 2.5-2.5 MCG/ACT AERS INHALE 2 PUFFS INTO THE LUNGS DAILY 4 g 5   warfarin (COUMADIN) 5 MG tablet TAKE 1 TABLET BY MOUTH DAILY 90 tablet 2     warfarin (COUMADIN) 7.5 MG tablet TAKE 1 TABLET BY MOUTH EVERY EVENING (Patient taking differently: TAKE 1 TABLET BY MOUTH EVERY EVENING Or as directed by coumadin clinic) 90 tablet 2   No current facility-administered medications on file prior to visit.    Cardiovascular studies: EKG 07/06/2021: Atrial fibrillation 129 bpm Right bundle branch block Left axis -bifascicular block  Old anteroseptal infarct  EKG 07/02/2021:  Atrial fibrillation with controlled ventricular response at a rate of 92 bpm.  EKG 06/20/2021:  Sinus rhythm at a rate of 70 bpm.  Left axis, left anterior fascicular block.  Echocardiogram 06/01/2020:  Normal LV systolic function with EF 55%. Left ventricle cavity is normal   in size. Normal global wall motion. Calculated EF 55%.  Left atrial cavity is moderately dilated.  Well seated mechanical aortic valve.  mean PG 22 mmHg with acceleration  time <100 msec. Above findings similar to previous TTE and TEE in 2020,  and suggest patient prosthesis mismatch in 19 mm St Jude valve, rather  than prosthetic valve stenosis. DVI of 0.18 is likely erroneous due  improper LV velocity measurement.  Mild (Grade I) aortic regurgitation.  Well seated, well functioning 25 mm Regent mechanical mitral valve. Mean  PG 3 mmHg, which is normal. Trace mitral regurgitation.  Estimated PASP 31 mmHg.  No significant change compared to previous transthoracic and  transesophageal studies in 2020.  EKG 01/22/2021: Probable sinus bradycardia 46 bpm LAFB  Coronary and bypass graft angiography 07/21/2018 and 08/18/2018: LM: Normal LAD: 100% mid occlusion. Mild distal disease LIMA-LAD: Patent LCx: Normal RCA: Prox 60% stenosis, mid 30-40% stenoses. TIMI III flow in prox-mid RCA. 100% distal occlusion SVG-RCA: Patent. Ostial 40%. Distal RCA moderate diffuse disease.    Guide catheter angiography provided superior images. There is TIMI III flow in SVG which fills RCA all the way to the ostium. Even if she were to have FFR positive lesion in the graft, I do not think the benefits of a stent would outweigh the risk of losing the graft and the entire RCA territory circulation in a borderline lesion. Also, the prox RCA lesion is well bypassed by the SVG graft. Thus, I decided not to perform FFR/PCI to either of these lesions. Continue medical management.   RHC 07/21/2018: #1: RA: 18 mmHg RV 90/12 mmHg PA: 94/40 mmHg. Mean PA 63 mmHg. PW 33 mmHg  Lasix 80 mg  #2: RA 19 mmHg RV 60/0 mmHg PA: 56/16 mmHg. Mean PA 36 mmHg PW: 25 mmHg  CO: 5.4 L/min. CI 2.6 L/min/m2 PVR 2 WU  Impression: Elevated filling pressures Moderate WHO Grp II pulmonary hypertension Improvement in filling  pressures with diuresis.  TEE 07/21/2018:  1. The left ventricle has normal systolic function, with an ejection fraction of 55-60%. There is abnormal septal motion consistent with post-operative status.  2. The right ventricle has normal systolc function.  3. A 25 mm Regent mechanical valve is present in the mitral position. Procedure Date: 2018 Echo findings are consistent with is functioning normally the mitral prosthesis. Normal mitral valve prosthesis. Mean PG 6 mmHg, MVA 1.7 cm2. No thrombus seen.  (Patient known to have subtherapeutic INR).  4. A 19mm St. Jude mechanical prosthesis valve is present in the aortic position. Procedure Date: 2018 Normal aortic valve prosthesis. Echo findings shows no evidence of Accelration time of 88 msec suggests normal functioning valve. Mean PG 25 mmHg  likely due to small annular diameter. No prosthetic valve stenosis noted. of the aortic prosthesis. No thrombus   seen. (Patient known to have subtherapeutic INR).  Recent labs:    Latest Ref Rng & Units 06/19/2021    8:00 AM 05/10/2021    9:50 AM 11/30/2020   12:00 AM  CMP  Glucose 70 - 99 mg/dL 102   96     BUN 6 - 23 mg/dL _0 Creatinine 0.40 - 1.20 mg/dL 1.48   1.41   1.4       Sodium 135 - 145 mEq/L 135   134   139       Potassium 3.5 - 5.1 mEq/L 4.1   4.5   4.5       Chloride 96 - 112 mEq/L 102   101   105       CO2 19 - 32 mEq/L _1 Calcium 8.4 - 10.5 mg/dL 9.1   9.6   9.5       Total Protein 6.0 - 8.3 g/dL 7.4   7.6     Total Bilirubin 0.2 - 1.2 mg/dL 0.3   0.4     Alkaline Phos 39 - 117 U/L 98   90     AST 0 - 37 U/L 32   40     ALT 0 - 35 U/L 42   57        This result is from an external source.      Latest Ref Rng & Units 05/10/2021    9:50 AM 07/03/2020    9:45 AM 07/28/2018    7:13 AM  CBC  WBC 4.0 - 10.5 K/uL 6.6   6.9   7.9    Hemoglobin 12.0 - 15.0 g/dL 11.6   11.9   13.2    Hematocrit 36.0 - 46.0 % 35.8   36.8   40.4    Platelets 150.0 - 400.0 K/uL  221.0   199   183     Lipid Panel     Component Value Date/Time   CHOL 116 05/10/2021 0950   TRIG 115.0 05/10/2021 0950   HDL 45.50 05/10/2021 0950   CHOLHDL 3 05/10/2021 0950   VLDL 23.0 05/10/2021 0950   LDLCALC 48 05/10/2021 0950   HEMOGLOBIN A1C Lab Results  Component Value Date   HGBA1C 6.4 05/10/2021   TSH No results for input(s): TSH in the last 8760 hours.   April-May 2022: Glucose 129, BUN/Cr 25/1.09. eGFR 59. Na/K 136/4.7. Rest of the CMP normal H/H 11.9/36.8. MCV 92. Platelets 199 TSH 2.8 normal  02/16/2018: BNP 251 elevated  Labs 07/02/2017: A1c 5.7%, HB 12.9/HCT 38.6, platelets 220, normal indicis.  Serum glucose 90 mg, BUN 23, creatinine 0.75, CMP normal.  Potassium 4.6.  TSH normal.  Review of Systems  Constitutional: Negative for malaise/fatigue and weight gain.  Cardiovascular:  Positive for dyspnea on exertion and palpitations. Negative for chest pain, claudication, leg swelling, near-syncope, orthopnea, paroxysmal nocturnal dyspnea and syncope.  Neurological:  Negative for dizziness.        Vitals:   07/06/21 1444  BP: 108/72  Pulse: (!) 112     Objective:     Physical Exam Vitals and nursing note reviewed.  Constitutional:      General: She is not in acute distress. Neck:     Vascular: No JVD.  Cardiovascular:     Rate and Rhythm: Tachycardia present. Rhythm irregular.     Heart sounds: Normal  heart sounds. No murmur heard.    Comments: Metallic S1, S2 Pulmonary:     Effort: Pulmonary effort is normal.     Breath sounds: Normal breath sounds. No wheezing or rales.  Musculoskeletal:     Right lower leg: No edema.     Left lower leg: No edema.       Assessment & Recommendations:   61 y.o. 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  Paroxysmal Afib: EKG today reveals atrial fibrillation with rapid ventricular response Continue diltiazem and amiodarone Patient has only been taking Lopressor once daily, have advised her to take take 25 mg twice daily and if heart rate remains >110 bpm patient may take Lopressor 25 mg 3 times daily. High CHA2DS2VAsc score. Continue warfarin, as below.  Continue monitoring in our warfarin clinic. Given that patient has been in persistent atrial fibrillation since last office visit shared decision was to proceed with direct-current cardioversion.  Reviewed risks, benefits, and indications with patient and she agrees with undergoing cardioversion.  We will schedule accordingly.  CAD of native and bypass grafts without angina: Patent grafts with non-critical disease.(07/2018) Continue medical management with Aspirin, Imdur 60 mg daiy, losartan 50 mg daily, diltiazem 180 mg daily, crestor 40 mg daily. No changes are made today.  S/p MVR, AVR: Continue warfarin  Hypertension: Remains controlled  Follow-up after cardioversion.  Patient was seen in collaboration with Dr. [name] and he is in agreement with the plan.     Celeste C Cantwell, PA-C 07/06/2021, 2:45 PM Office: [phone]  

## 2021-07-09 ENCOUNTER — Encounter: Payer: Self-pay | Admitting: Cardiology

## 2021-07-09 NOTE — Telephone Encounter (Signed)
From pt

## 2021-07-09 NOTE — Telephone Encounter (Signed)
Could consider ER visit for cardioversion since elective cardioversion slot is not available till June.  Thanks MJP

## 2021-07-11 ENCOUNTER — Other Ambulatory Visit: Payer: Self-pay | Admitting: Cardiology

## 2021-07-11 DIAGNOSIS — I25119 Atherosclerotic heart disease of native coronary artery with unspecified angina pectoris: Secondary | ICD-10-CM

## 2021-07-13 ENCOUNTER — Encounter: Payer: Self-pay | Admitting: Nurse Practitioner

## 2021-07-13 LAB — HM DIABETES EYE EXAM

## 2021-07-16 ENCOUNTER — Encounter: Payer: Self-pay | Admitting: Cardiology

## 2021-07-16 DIAGNOSIS — I480 Paroxysmal atrial fibrillation: Secondary | ICD-10-CM | POA: Diagnosis not present

## 2021-07-16 DIAGNOSIS — Z7901 Long term (current) use of anticoagulants: Secondary | ICD-10-CM | POA: Diagnosis not present

## 2021-07-16 DIAGNOSIS — Z952 Presence of prosthetic heart valve: Secondary | ICD-10-CM | POA: Diagnosis not present

## 2021-07-17 LAB — POCT INR: INR: 3.5 — AB (ref 2–3)

## 2021-07-17 NOTE — Telephone Encounter (Signed)
From patient.

## 2021-07-17 NOTE — Telephone Encounter (Signed)
Good to know. Just keep follow up as it is, but  can cancel cardioversion-if it is scheduled.  Thanks MJP

## 2021-07-18 ENCOUNTER — Ambulatory Visit: Payer: Self-pay | Admitting: Pharmacist

## 2021-07-18 ENCOUNTER — Ambulatory Visit: Payer: Medicare Other | Admitting: Licensed Clinical Social Worker

## 2021-07-18 DIAGNOSIS — Z952 Presence of prosthetic heart valve: Secondary | ICD-10-CM | POA: Diagnosis not present

## 2021-07-18 DIAGNOSIS — Z7901 Long term (current) use of anticoagulants: Secondary | ICD-10-CM | POA: Diagnosis not present

## 2021-07-18 DIAGNOSIS — Z5181 Encounter for therapeutic drug level monitoring: Secondary | ICD-10-CM

## 2021-07-18 NOTE — Progress Notes (Signed)
Patient is at goal, INR 3.5. 15 minutes spent face-to-face with the patient during the encounter. 50% of time spent on education, including signs/sx bleeding and clotting, as well as food and drug interactions with warfarin. 50% of time was spent on fingerprick POC INR sample collection,processing, results determination, and documentation

## 2021-07-26 ENCOUNTER — Ambulatory Visit
Admission: RE | Admit: 2021-07-26 | Discharge: 2021-07-26 | Disposition: A | Discharge status: Home / Self Care | Payer: Medicare Other | Source: Ambulatory Visit | Attending: Nurse Practitioner | Admitting: Nurse Practitioner

## 2021-07-26 DIAGNOSIS — Z1231 Encounter for screening mammogram for malignant neoplasm of breast: Secondary | ICD-10-CM

## 2021-07-27 ENCOUNTER — Telehealth: Payer: Self-pay | Admitting: Nurse Practitioner

## 2021-07-27 ENCOUNTER — Other Ambulatory Visit: Payer: Self-pay | Admitting: Nurse Practitioner

## 2021-07-27 ENCOUNTER — Encounter (HOSPITAL_COMMUNITY): Payer: Self-pay

## 2021-07-27 ENCOUNTER — Ambulatory Visit (HOSPITAL_COMMUNITY): Admit: 2021-07-27 | Payer: Medicare Other | Admitting: Cardiology

## 2021-07-27 SURGERY — CARDIOVERSION
Anesthesia: Monitor Anesthesia Care

## 2021-07-27 NOTE — Telephone Encounter (Signed)
  Encourage patient to contact the pharmacy for refills or they can request refills through [facility]:  Please schedule appointment if longer than 1 year  NEXT APPOINTMENT DATE:  MEDICATION:clonazePAM (KLONOPIN) 0.5 MG tablet  Is the patient out of medication?   PHARMACY: [facility] [place] - [address] AT [place] [address] Phone:  [phone]      Let patient know to contact pharmacy at the end of the day to make sure medication is ready.  Please notify patient to allow 48-72 hours to process  CLINICAL FILLS OUT ALL BELOW:   LAST REFILL:  QTY:  REFILL DATE:    OTHER COMMENTS:    Okay for refill?  Please advise

## 2021-07-27 NOTE — Telephone Encounter (Signed)
Chart Supports Rx Last OV: 04/2021 Next OV: 07/2021

## 2021-07-27 NOTE — Telephone Encounter (Signed)
Called & advised pt that she has one more refill left on her RX, she is to call the pharmacy to have them fill that medication.

## 2021-07-31 ENCOUNTER — Ambulatory Visit: Payer: Medicare Other | Admitting: Student

## 2021-08-10 ENCOUNTER — Encounter: Payer: Self-pay | Admitting: Nurse Practitioner

## 2021-08-10 ENCOUNTER — Ambulatory Visit: Payer: Medicare Other | Admitting: Cardiology

## 2021-08-10 ENCOUNTER — Ambulatory Visit (INDEPENDENT_AMBULATORY_CARE_PROVIDER_SITE_OTHER): Payer: Medicare Other

## 2021-08-10 ENCOUNTER — Ambulatory Visit (INDEPENDENT_AMBULATORY_CARE_PROVIDER_SITE_OTHER): Payer: Medicare Other | Admitting: Nurse Practitioner

## 2021-08-10 VITALS — BP 110/56 | HR 47 | Temp 97.2°F | Ht 65.0 in | Wt 213.0 lb

## 2021-08-10 DIAGNOSIS — E1165 Type 2 diabetes mellitus with hyperglycemia: Secondary | ICD-10-CM

## 2021-08-10 DIAGNOSIS — M549 Dorsalgia, unspecified: Secondary | ICD-10-CM

## 2021-08-10 DIAGNOSIS — G8929 Other chronic pain: Secondary | ICD-10-CM

## 2021-08-10 DIAGNOSIS — D1779 Benign lipomatous neoplasm of other sites: Secondary | ICD-10-CM | POA: Diagnosis not present

## 2021-08-10 DIAGNOSIS — M546 Pain in thoracic spine: Secondary | ICD-10-CM | POA: Diagnosis not present

## 2021-08-10 DIAGNOSIS — R1010 Upper abdominal pain, unspecified: Secondary | ICD-10-CM | POA: Diagnosis not present

## 2021-08-13 ENCOUNTER — Ambulatory Visit: Payer: Medicare Other | Admitting: Student

## 2021-08-13 DIAGNOSIS — Z7901 Long term (current) use of anticoagulants: Secondary | ICD-10-CM | POA: Diagnosis not present

## 2021-08-13 DIAGNOSIS — I480 Paroxysmal atrial fibrillation: Secondary | ICD-10-CM | POA: Diagnosis not present

## 2021-08-13 DIAGNOSIS — Z952 Presence of prosthetic heart valve: Secondary | ICD-10-CM | POA: Diagnosis not present

## 2021-08-14 LAB — URINALYSIS W MICROSCOPIC + REFLEX CULTURE

## 2021-08-15 ENCOUNTER — Other Ambulatory Visit (INDEPENDENT_AMBULATORY_CARE_PROVIDER_SITE_OTHER): Payer: Medicare Other

## 2021-08-15 ENCOUNTER — Encounter: Payer: Self-pay | Admitting: Cardiology

## 2021-08-15 ENCOUNTER — Ambulatory Visit: Payer: Medicare Other | Admitting: Cardiology

## 2021-08-15 VITALS — BP 114/45 | HR 56 | Temp 98.6°F | Resp 16 | Ht 65.0 in | Wt 214.0 lb

## 2021-08-15 DIAGNOSIS — Z952 Presence of prosthetic heart valve: Secondary | ICD-10-CM | POA: Diagnosis not present

## 2021-08-15 DIAGNOSIS — I480 Paroxysmal atrial fibrillation: Secondary | ICD-10-CM

## 2021-08-15 DIAGNOSIS — I25708 Atherosclerosis of coronary artery bypass graft(s), unspecified, with other forms of angina pectoris: Secondary | ICD-10-CM

## 2021-08-15 DIAGNOSIS — E1165 Type 2 diabetes mellitus with hyperglycemia: Secondary | ICD-10-CM

## 2021-08-15 DIAGNOSIS — R1010 Upper abdominal pain, unspecified: Secondary | ICD-10-CM

## 2021-08-15 LAB — COMPREHENSIVE METABOLIC PANEL
ALT: 38 U/L — ABNORMAL HIGH (ref 0–35)
AST: 32 U/L (ref 0–37)
Albumin: 4.7 g/dL (ref 3.5–5.2)
Alkaline Phosphatase: 94 U/L (ref 39–117)
BUN: 31 mg/dL — ABNORMAL HIGH (ref 6–23)
CO2: 25 mEq/L (ref 19–32)
Calcium: 9.7 mg/dL (ref 8.4–10.5)
Chloride: 101 mEq/L (ref 96–112)
Creatinine, Ser: 1.47 mg/dL — ABNORMAL HIGH (ref 0.40–1.20)
GFR: 38.71 mL/min — ABNORMAL LOW (ref >60.00)
Glucose, Bld: 104 mg/dL — ABNORMAL HIGH (ref 70–99)
Potassium: 4.6 mEq/L (ref 3.5–5.1)
Sodium: 135 mEq/L (ref 135–145)
Total Bilirubin: 0.5 mg/dL (ref 0.2–1.2)
Total Protein: 7.9 g/dL (ref 6.0–8.3)

## 2021-08-15 LAB — CBC
HCT: 34 % — ABNORMAL LOW (ref 36.0–46.0)
Hemoglobin: 10.8 g/dL — ABNORMAL LOW (ref 12.0–15.0)
MCHC: 31.8 g/dL (ref 30.0–36.0)
MCV: 80.4 fl (ref 78.0–100.0)
Platelets: 240 10*3/uL (ref 150.0–400.0)
RBC: 4.23 Mil/uL (ref 3.87–5.11)
RDW: 16.6 % — ABNORMAL HIGH (ref 11.5–15.5)
WBC: 7.7 10*3/uL (ref 4.0–10.5)

## 2021-08-15 LAB — HEMOGLOBIN A1C: Hgb A1c MFr Bld: 6.3 % (ref 4.6–6.5)

## 2021-08-15 LAB — LIPASE: Lipase: 22 U/L (ref 11.0–59.0)

## 2021-08-15 MED ORDER — METOPROLOL SUCCINATE ER 25 MG PO TB24: 25.0000 mg | ORAL_TABLET | Freq: Every day | ORAL | 3 refills | Status: DC

## 2021-08-15 NOTE — Progress Notes (Signed)
Subjective:   Kimberly Hobbs, female    DOB: 12-04-1960, 61 y.o.   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  Patient was last seen by Lawerance Cruel, [place] 06/2021.  Given her A-fib episode, cardioversion was recommended.  However, patient self converted to sinus rhythm.  She has been doing well since then.  Denies any complaints today.  Home INR is have been reportedly in normal range.   Current Outpatient Medications:    acetaminophen (TYLENOL) 500 MG tablet, Take 500 mg by mouth every 6 (six) hours as needed (for pain.)., Disp: , Rfl:    albuterol (VENTOLIN HFA) 108 (90 Base) MCG/ACT inhaler, Inhale 2 puffs into the lungs every 6 (six) hours as needed for wheezing or shortness of breath. , Disp: , Rfl:    amiodarone (PACERONE) 200 MG tablet, Take 1 tablet (200 mg total) by mouth daily., Disp: 30 tablet, Rfl: 3   aspirin EC 81 MG tablet, Take 81 mg by mouth daily. , Disp: , Rfl:    citalopram (CELEXA) 40 MG tablet, Take 0.5 tablets (20 mg total) by mouth 2 (two) times daily., Disp: 90 tablet, Rfl: 3   clonazePAM (KLONOPIN) 0.5 MG tablet, Take 1.5 tablets (0.75 mg total) by mouth at bedtime., Disp: 45 tablet, Rfl: 2   diltiazem (CARDIZEM CD) 180 MG 24 hr capsule, TAKE 1 CAPSULE BY MOUTH DAILY, Disp: 90 capsule, Rfl: 2   furosemide (LASIX) 20 MG tablet, TAKE 1 TABLET BY MOUTH DAILY, Disp: 90 tablet, Rfl: 2   isosorbide mononitrate (IMDUR) 60 MG 24 hr tablet, TAKE 1 TABLET(60 MG) BY MOUTH DAILY, Disp: 90 tablet, Rfl: 3   Lancets (ONETOUCH DELICA PLUS [id number]) MISC, TAKE AS DIRECTED ONCE DAILY., Disp: , Rfl:    levalbuterol (XOPENEX HFA) 45 MCG/ACT inhaler, Inhale 1-2 puffs into the lungs every 6 (six) hours as needed  for shortness of breath., Disp: 1 Inhaler, Rfl: 12   losartan (COZAAR) 50 MG tablet, Take 1 tablet (50 mg total) by mouth daily., Disp: 90 tablet, Rfl: 3   metFORMIN (GLUCOPHAGE) 500 MG tablet, Take 1 tablet (500 mg total) by mouth 2 (two) times daily with a meal., Disp: 180 tablet, Rfl: 3   metoprolol succinate (TOPROL-XL) 25 MG 24 hr tablet, Take 1 tablet (25 mg total) by mouth daily. Take with or immediately following a meal., Disp: 90 tablet, Rfl: 3   nitroGLYCERIN (NITROSTAT) 0.4 MG SL tablet, Place 1 tablet (0.4 mg total) under the tongue every 5 (five) minutes x 3 doses as needed for chest pain., Disp: 25 tablet, Rfl: 3   ONETOUCH VERIO test strip, AS DIRECTED ONCE A DAY, Disp: , Rfl:    pantoprazole (PROTONIX) 40 MG tablet, TAKE 1 TABLET(40 MG) BY MOUTH DAILY, Disp: 90 tablet, Rfl: 0   rosuvastatin (CRESTOR) 40 MG tablet, Take 1 tablet (40 mg total) by mouth daily., Disp: 90 tablet, Rfl: 1   spironolactone (ALDACTONE) 50 MG tablet, TAKE 1 TABLET(50 MG) BY MOUTH DAILY, Disp: 30 tablet, Rfl: 6   STIOLTO RESPIMAT 2.5-2.5 MCG/ACT AERS, INHALE 2 PUFFS INTO THE LUNGS DAILY, Disp: 4 g, Rfl: 5   warfarin (COUMADIN) 5 MG tablet, TAKE 1 TABLET BY MOUTH DAILY, Disp: 90 tablet, Rfl: 2   warfarin (COUMADIN) 7.5 MG tablet,  TAKE 1 TABLET BY MOUTH EVERY EVENING (Patient taking differently: TAKE 1 TABLET BY MOUTH EVERY EVENING Or as directed by coumadin clinic), Disp: 90 tablet, Rfl: 2  Cardiovascular studies:  EKG 01/22/2021: Probable sinus bradycardia 46 bpm LAFB  Coronary and bypass graft angiography 07/21/2018 and 08/18/2018: LM: Normal LAD: 100% mid occlusion. Mild distal disease LIMA-LAD: Patent LCx: Normal RCA: Prox 60% stenosis, mid 30-40% stenoses. TIMI III flow in prox-mid RCA. 100% distal occlusion SVG-RCA: Patent. Ostial 40%. Distal RCA moderate diffuse disease.    Guide catheter angiography provided superior images. There is TIMI III flow in SVG which fills RCA all the way to the  ostium. Even if she were to have FFR positive lesion in the graft, I do not think the benefits of a stent would outweigh the risk of losing the graft and the entire RCA territory circulation in a borderline lesion. Also, the prox RCA lesion is well bypassed by the SVG graft. Thus, I decided not to perform FFR/PCI to either of these lesions. Continue medical management.   [place] 07/21/2018: #1: RA: 18 mmHg RV 90/12 mmHg PA: 94/40 mmHg. Mean PA 63 mmHg. PW 33 mmHg  Lasix 80 mg  #2: RA 19 mmHg RV 60/0 mmHg PA: 56/16 mmHg. Mean PA 36 mmHg PW: 25 mmHg  CO: 5.4 L/min. CI 2.6 L/min/m2 PVR 2 WU  Impression: Elevated filling pressures Moderate WHO Grp II pulmonary hypertension Improvement in filling pressures with diuresis.   Echocardiogram 02/26/2018: Left ventricle cavity is normal in size. Mild concentric hypertrophy of the left ventricle. Abnormal septal wall motion due to post-operative valve. Diastolic function could not be assessed due to post op valve and CABG status.  Calculated EF 63%. Left atrial cavity is moderate to severely dilated measures 4.5 cm in long axis. Right atrial cavity is mildly dilated. Mechanical aortic valve with trace regurgitation. Mild aortic valve leaflet calcification. Mildly restricted aortic valve leaflets. Mild to moderate aortic valve stenosis. Aortic valve peak pressure gradient of  43 and mean gradient of 21 mmHg, calculated aortic valve area    0.88 cm. Mechanical mitral valve with trace regurgitation. Moderately restricted mitral valve leaflets. Mild mitral valve stenosis. Mitral valve peak pressure gradient of  26  and mean gradient of  6.2  mmHg, calculated mitral valve area 1.9   cm. Mild to moderate tricuspid regurgitation. Mild pulmonary hypertension. PA systolic pressure estimated at 39 mm Hg. Compared to the study done on 08/06/2017, no significant change.  TEE 07/21/2018:  1. The left ventricle has normal systolic function, with an ejection  fraction of 55-60%. There is abnormal septal motion consistent with post-operative status.  2. The right ventricle has normal systolc function.  3. A 25 mm Regent mechanical valve is present in the mitral position. Procedure Date: 2018 Echo findings are consistent with is functioning normally the mitral prosthesis. Normal mitral valve prosthesis. Mean PG 6 mmHg, MVA 1.7 cm2. No thrombus seen.  (Patient known to have subtherapeutic INR).  4. A 72m St. Jude mechanical prosthesis valve is present in the aortic position. Procedure Date: 2018 Normal aortic valve prosthesis. Echo findings shows no evidence of Accelration time of 88 msec suggests normal functioning valve. Mean PG 25 mmHg  likely due to small annular diameter. No prosthetic valve stenosis noted. of the aortic prosthesis. No thrombus seen. (Patient known to have subtherapeutic INR).  Recent labs: April-May 2022: Glucose 129, BUN/Cr 25/1.09. eGFR 59. Na/K 136/4.7. Rest of the CMP normal H/H 11.9/36.8. MCV 92. Platelets 199  TSH 2.8 normal  02/16/2018: BNP 251 elevated  Labs 07/02/2017: A1c 5.7%, HB 12.9/HCT 38.6, platelets 220, normal indicis.  Serum glucose 90 mg, BUN 23, creatinine 0.75, CMP normal.  Potassium 4.6.  TSH normal.  Review of Systems  Cardiovascular:  Negative for chest pain, dyspnea on exertion, leg swelling, palpitations and syncope.  Musculoskeletal:  Positive for joint pain.       Right forearm/elbow pain  Neurological:  Positive for dizziness.         Vitals:   08/15/21 1022  BP: (!) 114/45  Pulse: (!) 56  Resp: 16  Temp: 98.6 F (37 C)  SpO2: 97%     Objective:     Physical Exam Vitals and nursing note reviewed.  Constitutional:      General: She is not in acute distress. Neck:     Vascular: No JVD.  Cardiovascular:     Rate and Rhythm: Bradycardia present. Rhythm irregular.     Heart sounds: No murmur heard.    Comments: Metallic S1, S2 Pulmonary:     Effort: Pulmonary effort is  normal.     Breath sounds: Normal breath sounds. No wheezing or rales.          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  Paroxysmal Afib: Currently in sinus rhythm Changed metoprolol tartrate to succinate 25 mg daily.  Also on diltiazem 180 mg daily, amiodarone 200 mg daily. High CHA2DS2VAsc score. Continue warfarin, as below.  Continue monitoring in our warfarin clinic.  CAD of native and bypass grafts without angina: Patent grafts with non-critical disease.(07/2018). Continue Aspirin, statin.  S/p MVR, AVR: Continue warfarin  Hypertension: Controlled.  F/u in 6 months  Leona Alen Esther Hardy, MD [facility] Pager: [phone] Office: [phone] If no answer Cell [phone]

## 2021-08-20 ENCOUNTER — Ambulatory Visit: Payer: Medicare Other | Admitting: Cardiology

## 2021-08-24 DIAGNOSIS — I351 Nonrheumatic aortic (valve) insufficiency: Secondary | ICD-10-CM | POA: Diagnosis not present

## 2021-08-28 ENCOUNTER — Telehealth: Payer: Self-pay | Admitting: Nurse Practitioner

## 2021-08-28 DIAGNOSIS — F411 Generalized anxiety disorder: Secondary | ICD-10-CM

## 2021-08-28 MED ORDER — CLONAZEPAM 0.5 MG PO TABS: 0.7500 mg | ORAL_TABLET | Freq: Every day | ORAL | 0 refills | Status: DC

## 2021-08-28 MED ORDER — CLONAZEPAM 0.5 MG PO TABS: 0.7500 mg | ORAL_TABLET | Freq: Every day | ORAL | 1 refills | Status: DC

## 2021-08-28 NOTE — Telephone Encounter (Signed)
Called & spoke w/ pt, has a follow up appt scheduled to come in, but pt states she needs more than 11 tablets to hold her off until then.

## 2021-08-28 NOTE — Telephone Encounter (Signed)
error 

## 2021-08-28 NOTE — Telephone Encounter (Signed)
Caller Name: pt Call back phone #: [phone]  MEDICATION(S): clonazePAM (KLONOPIN) 0.5 MG tablet [[id number]]   Days of Med Remaining: she is out  Has the patient contacted their pharmacy (YES/NO)?  Yes contact your PCP, she is out of town with her mom who is in the hospital.   [facility]: [facility] Address: [address], [place], [place] [address] Phone: [phone]

## 2021-08-28 NOTE — Telephone Encounter (Signed)
Pt advised.

## 2021-08-28 NOTE — Telephone Encounter (Signed)
Pt called in stating she cancelled appt with the other provider. She was thinking of switching to someone who would see her for more than 2 issues per visit but when she asked the other office they told her they follow the same guideline. Pt will stay with Kimberly Hobbs and asked for full refill.

## 2021-08-29 NOTE — Telephone Encounter (Signed)
Pt needs Clonazepam sent to this pharmacy :  Preferred [facility] Address: [address] [place], [place] [address] Phone: [phone]

## 2021-08-30 ENCOUNTER — Ambulatory Visit: Payer: Medicare Other | Admitting: Nurse Practitioner

## 2021-08-30 MED ORDER — CLONAZEPAM 0.5 MG PO TABS: 0.7500 mg | ORAL_TABLET | Freq: Every day | ORAL | 0 refills | Status: DC

## 2021-08-30 NOTE — Telephone Encounter (Signed)
Pt called again about her [facility]epam being [facility] [place] on [address].   She has been without for 3 days and really feeling bad because of. She is frustrated.   Pt needs Clonazepam sent to this pharmacy :  [facility] Address: [address] [place], [place] [address] Phone: [phone]. Please advise

## 2021-08-30 NOTE — Telephone Encounter (Signed)
Pt called again about her [facility]epam being [facility] [place] on [address] [place].  She has been without for 3 days and really feeling bad because of. Please call to let her know. She is frustrated.

## 2021-08-31 NOTE — Telephone Encounter (Signed)
Pt advised.

## 2021-09-05 ENCOUNTER — Telehealth: Payer: Self-pay

## 2021-09-05 DIAGNOSIS — I480 Paroxysmal atrial fibrillation: Secondary | ICD-10-CM

## 2021-09-05 NOTE — Telephone Encounter (Signed)
Goal: 2.5- Indication: 3.5 Today's INR: 3.7 Current dose:5 mg Thu, Sat, 7.5 mg all other days  New dose: 5 mg W, Th, Sat,  7.5 mg all other days Next INR check: 10 days  Vernell Leep, MD

## 2021-09-05 NOTE — Telephone Encounter (Signed)
Pt has been notify about the changes on her warfarin to '5mg'$  Wed, Thur, Sat. And 7.5 all the other days will recheck in 10 days

## 2021-09-05 NOTE — Telephone Encounter (Signed)
Pt called to inform us that her INR is 3.7      INR goal:  2.5-3.5  TTR:  61.9 % (3.3 y)  INR used for dosing:  3.5 (07/17/2021)  Warfarin maintenance plan:  5 mg (5 mg x 1) every Thu, Sat; 7.5 mg (5 mg x 1.5) all other days  Weekly warfarin total:  47.5 mg  Plan last modified:  [name], [facility] (06/18/2021)  Next INR check:  07/26/2021

## 2021-09-11 DIAGNOSIS — I480 Paroxysmal atrial fibrillation: Secondary | ICD-10-CM | POA: Diagnosis not present

## 2021-09-11 DIAGNOSIS — Z7901 Long term (current) use of anticoagulants: Secondary | ICD-10-CM | POA: Diagnosis not present

## 2021-09-11 DIAGNOSIS — Z952 Presence of prosthetic heart valve: Secondary | ICD-10-CM | POA: Diagnosis not present

## 2021-09-20 ENCOUNTER — Ambulatory Visit (INDEPENDENT_AMBULATORY_CARE_PROVIDER_SITE_OTHER): Payer: Medicare Other | Admitting: Nurse Practitioner

## 2021-09-20 ENCOUNTER — Encounter: Payer: Self-pay | Admitting: Nurse Practitioner

## 2021-09-20 VITALS — BP 130/68 | HR 52 | Temp 97.1°F | Ht 65.0 in | Wt 214.0 lb

## 2021-09-20 DIAGNOSIS — F411 Generalized anxiety disorder: Secondary | ICD-10-CM

## 2021-09-20 DIAGNOSIS — I272 Pulmonary hypertension, unspecified: Secondary | ICD-10-CM | POA: Diagnosis not present

## 2021-09-20 DIAGNOSIS — J42 Unspecified chronic bronchitis: Secondary | ICD-10-CM | POA: Diagnosis not present

## 2021-09-20 DIAGNOSIS — M1711 Unilateral primary osteoarthritis, right knee: Secondary | ICD-10-CM | POA: Diagnosis not present

## 2021-09-20 DIAGNOSIS — H903 Sensorineural hearing loss, bilateral: Secondary | ICD-10-CM

## 2021-09-20 MED ORDER — CLONAZEPAM 0.5 MG PO TABS: 0.7500 mg | ORAL_TABLET | Freq: Every day | ORAL | 2 refills | Status: DC

## 2021-09-20 MED ORDER — PANTOPRAZOLE SODIUM 40 MG PO TBEC: 40.0000 mg | DELAYED_RELEASE_TABLET | Freq: Every day | ORAL | 1 refills | Status: DC

## 2021-09-20 NOTE — Assessment & Plan Note (Signed)
Current use of Stiolto daily and xopenex prn Clear lung sounds

## 2021-09-20 NOTE — Assessment & Plan Note (Signed)
Stable mood Maintain klonopin and citalopram dose Advised to schedule f/up with therapist due to recent death of her mother.

## 2021-09-20 NOTE — Progress Notes (Signed)
Established Patient Visit  Patient: Kimberly Hobbs   DOB: [birth year]   61 y.o. Female  MRN: [id number] Visit Date: 09/20/2021  Subjective:    Chief Complaint  Patient presents with   Office Visit    Clonazepam refill No concerns today    Ear Fullness  There is pain in both ears. This is a chronic problem. The current episode started more than 1 month ago. The problem occurs constantly. The problem has been waxing and waning. There has been no fever. Associated symptoms include hearing loss. Pertinent negatives include no abdominal pain, coughing, diarrhea, ear discharge, headaches, neck pain, rash, rhinorrhea, sore throat or vomiting. She has tried nothing for the symptoms. There is no history of a chronic ear infection, hearing loss or a tympanostomy tube.  Knee Pain  The incident occurred more than 1 week ago. There was no injury mechanism. The pain is present in the right knee. The quality of the pain is described as aching. The pain is moderate. The pain has been Intermittent since onset. Associated symptoms include muscle weakness. Pertinent negatives include no inability to bear weight, loss of motion, loss of sensation, numbness or tingling. Associated symptoms comments: swelling. She reports no foreign bodies present. The symptoms are aggravated by weight bearing. She has tried rest for the symptoms. The treatment provided mild relief.   Pulmonary hypertension, unspecified ([place]) Repeat echo 2022: stable and no change compared to 2020. No SOB or CP or LE edema or syncope. BP Readings from Last 3 Encounters:  09/20/21 130/68  08/15/21 (!) 114/45  08/10/21 (!) 110/56  Care Team: Dr. [name]-cardiology  COPD (chronic obstructive pulmonary disease) ([place]) Current use of Stiolto daily and xopenex prn Clear lung sounds  GAD (generalized anxiety disorder) Stable mood Maintain klonopin and citalopram dose Advised to schedule f/up with therapist due to recent death  of her mother.  Hearing Screening  Method: Audiometry   '125Hz'$  '250Hz'$  '500Hz'$  '1000Hz'$  '2000Hz'$   Right ear Fail Pass Pass Pass Pass  Left ear Fail Pass Pass Pass Pass    Reviewed medical, surgical, and social history today  Medications: Outpatient Medications Prior to Visit  Medication Sig   acetaminophen (TYLENOL) 500 MG tablet Take 500 mg by mouth every 6 (six) hours as needed (for pain.).   amiodarone (PACERONE) 200 MG tablet Take 1 tablet (200 mg total) by mouth daily.   aspirin EC 81 MG tablet Take 81 mg by mouth daily.    citalopram (CELEXA) 40 MG tablet Take 0.5 tablets (20 mg total) by mouth 2 (two) times daily.   diltiazem (CARDIZEM CD) 180 MG 24 hr capsule TAKE 1 CAPSULE BY MOUTH DAILY   furosemide (LASIX) 20 MG tablet TAKE 1 TABLET BY MOUTH DAILY   isosorbide mononitrate (IMDUR) 60 MG 24 hr tablet TAKE 1 TABLET(60 MG) BY MOUTH DAILY   Lancets (ONETOUCH DELICA PLUS [id number]) MISC TAKE AS DIRECTED ONCE DAILY.   levalbuterol (XOPENEX HFA) 45 MCG/ACT inhaler Inhale 1-2 puffs into the lungs every 6 (six) hours as needed for shortness of breath.   losartan (COZAAR) 50 MG tablet Take 1 tablet (50 mg total) by mouth daily.   metFORMIN (GLUCOPHAGE) 500 MG tablet Take 1 tablet (500 mg total) by mouth 2 (two) times daily with a meal.   metoprolol succinate (TOPROL-XL) 25 MG 24 hr tablet Take 1 tablet (25 mg total) by mouth daily. Take with or immediately following a meal.  nitroGLYCERIN (NITROSTAT) 0.4 MG SL tablet Place 1 tablet (0.4 mg total) under the tongue every 5 (five) minutes x 3 doses as needed for chest pain.   ONETOUCH VERIO test strip AS DIRECTED ONCE A DAY   rosuvastatin (CRESTOR) 40 MG tablet Take 1 tablet (40 mg total) by mouth daily.   spironolactone (ALDACTONE) 50 MG tablet TAKE 1 TABLET(50 MG) BY MOUTH DAILY   STIOLTO RESPIMAT 2.5-2.5 MCG/ACT AERS INHALE 2 PUFFS INTO THE LUNGS DAILY   warfarin (COUMADIN) 5 MG tablet TAKE 1 TABLET BY MOUTH DAILY   warfarin (COUMADIN) 7.5 MG  tablet TAKE 1 TABLET BY MOUTH EVERY EVENING (Patient taking differently: TAKE 1 TABLET BY MOUTH EVERY EVENING Or as directed by coumadin clinic)   [DISCONTINUED] albuterol (VENTOLIN HFA) 108 (90 Base) MCG/ACT inhaler Inhale 2 puffs into the lungs every 6 (six) hours as needed for wheezing or shortness of breath.    [DISCONTINUED] clonazePAM (KLONOPIN) 0.5 MG tablet Take 1.5 tablets (0.75 mg total) by mouth at bedtime.   [DISCONTINUED] pantoprazole (PROTONIX) 40 MG tablet TAKE 1 TABLET(40 MG) BY MOUTH DAILY   No facility-administered medications prior to visit.   Reviewed past medical and social history.   ROS per HPI above      Objective:  BP 130/68 (BP Location: Right Arm, Patient Position: Sitting, Cuff Size: Normal)   Pulse (!) 52   Temp (!) 97.1 F (36.2 C) (Temporal)   Ht '5\' 5"'$  (1.651 m)   Wt 214 lb (97.1 kg)   SpO2 96%   BMI 35.61 kg/m      Physical Exam Vitals reviewed.  HENT:     Right Ear: Tympanic membrane, ear canal and external ear normal.     Left Ear: Tympanic membrane, ear canal and external ear normal.  Cardiovascular:     Rate and Rhythm: Normal rate and regular rhythm.     Pulses: Normal pulses.     Heart sounds: Murmur heard.  Pulmonary:     Effort: Pulmonary effort is normal.     Breath sounds: Normal breath sounds.  Musculoskeletal:     Right knee: Crepitus present. No swelling, effusion or erythema. Normal range of motion. Tenderness present over the medial joint line and lateral joint line. No patellar tendon tenderness.     Left knee: Crepitus present. No swelling, effusion or erythema. Normal range of motion. No tenderness.     Right lower leg: No edema.     Left lower leg: No edema.  Neurological:     Mental Status: She is alert and oriented to person, place, and time.  Psychiatric:        Attention and Perception: Attention normal.        Mood and Affect: Affect is flat.        Speech: Speech normal.        Behavior: Behavior normal.         Thought Content: Thought content normal.        Cognition and Memory: Cognition and memory normal.        Judgment: Judgment normal.     No results found for any visits on 09/20/21.    Assessment & Plan:    Problem List Items Addressed This Visit       Cardiovascular and Mediastinum   Pulmonary hypertension, unspecified ([place])    Repeat echo 2022: stable and no change compared to 2020. No SOB or CP or LE edema or syncope. BP Readings from Last 3 Encounters:  09/20/21  130/68  08/15/21 (!) 114/45  08/10/21 (!) 110/56  Care Team: Dr. [name]-cardiology        Respiratory   COPD (chronic obstructive pulmonary disease) ([place])    Current use of Stiolto daily and xopenex prn Clear lung sounds        Other   GAD (generalized anxiety disorder)    Stable mood Maintain klonopin and citalopram dose Advised to schedule f/up with therapist due to recent death of her mother.      Relevant Medications   clonazePAM (KLONOPIN) 0.5 MG tablet (Start on 09/30/2021)   Other Visit Diagnoses     Primary osteoarthritis of right knee    -  Primary   Relevant Orders   AMB referral to orthopedics   Sensorineural hearing loss (SNHL) of both ears       Relevant Orders   Ambulatory referral to Audiology      Return in about 3 months (around 12/21/2021) for hyperlipidemia, HTN, depression and anxiety (fasting).     Wilfred Lacy, NP

## 2021-09-20 NOTE — Assessment & Plan Note (Signed)
Repeat echo 2022: stable and no change compared to 2020. No SOB or CP or LE edema or syncope. BP Readings from Last 3 Encounters:  09/20/21 130/68  08/15/21 (!) 114/45  08/10/21 (!) 110/56  Care Team: Dr. [name]-cardiology

## 2021-09-20 NOTE — Patient Instructions (Addendum)
Use albuterol or xopenex as needed for shortness of breath or wheezing Use stiolto inhaler daily to prevent acute exacerbation.  You will be contacted to schedule appt with ortho.  Schedule appt with psychology.  You will be contacted to schedule appt with ortho and audiology  Maintain current medications.

## 2021-09-21 ENCOUNTER — Telehealth: Payer: Self-pay | Admitting: Nurse Practitioner

## 2021-09-21 NOTE — Telephone Encounter (Signed)
[facility] called stating that they cannot see this pt as they are not in network with medicaid. Pt will have to rerouted to different office for this referral.

## 2021-09-22 ENCOUNTER — Encounter: Payer: Self-pay | Admitting: Cardiology

## 2021-09-27 ENCOUNTER — Ambulatory Visit (INDEPENDENT_AMBULATORY_CARE_PROVIDER_SITE_OTHER): Payer: Medicare Other | Admitting: Orthopaedic Surgery

## 2021-09-27 ENCOUNTER — Ambulatory Visit (INDEPENDENT_AMBULATORY_CARE_PROVIDER_SITE_OTHER): Payer: Medicare Other

## 2021-09-27 VITALS — Ht 65.0 in | Wt 214.0 lb

## 2021-09-27 DIAGNOSIS — M25561 Pain in right knee: Secondary | ICD-10-CM

## 2021-09-27 DIAGNOSIS — G8929 Other chronic pain: Secondary | ICD-10-CM | POA: Diagnosis not present

## 2021-09-27 NOTE — Progress Notes (Signed)
Office Visit Note   Patient: Kimberly Hobbs           Date of Birth: Jan 26, 1961           MRN: [id number] Visit Date: 09/27/2021              Requested by: Flossie Buffy, NP [place],  [place] [id number] PCP: Flossie Buffy, NP   Assessment & Plan: Visit Diagnoses:  1. Chronic pain of right knee     Plan: Impression is right knee osteoarthritis.  She does not have much degenerative changes.  We talked about the various nonsurgical treatments and based on her options she will start with weight loss, home exercise program, Voltaren gel and continue knee brace.  Will hold off on cortisone injections until her pain worsens.  Follow-Up Instructions: No follow-ups on file.   Orders:  Orders Placed This Encounter  Procedures   XR KNEE 3 VIEW RIGHT   No orders of the defined types were placed in this encounter.     Procedures: No procedures performed   Clinical Data: No additional findings.   Subjective: Chief Complaint  Patient presents with   Right Knee - Pain    HPI Kimberly Hobbs is a very pleasant 61 year old female here for evaluation of chronic right knee pain that is getting worse.  Swelling at times.  Pain with walking.  Denies any mechanical symptoms.  Occasional giving way.  Wears a knee brace during activity.  Currently on disability for congestive heart failure due to rheumatic heart disease as a child.  She is also well-controlled diabetic with A1c in 6.3.  Takes Coumadin for her artificial valves.  Review of Systems  Constitutional: Negative.   HENT: Negative.    Eyes: Negative.   Respiratory: Negative.    Cardiovascular: Negative.   Endocrine: Negative.   Musculoskeletal: Negative.   Neurological: Negative.   Hematological: Negative.   Psychiatric/Behavioral: Negative.    All other systems reviewed and are negative.    Objective: Vital Signs: Ht '5\' 5"'$  (1.651 m)   Wt 214 lb (97.1 kg)   BMI 35.61 kg/m   Physical  Exam Vitals and nursing note reviewed.  Constitutional:      Appearance: She is well-developed.  HENT:     Head: Atraumatic.     Nose: Nose normal.  Eyes:     Extraocular Movements: Extraocular movements intact.  Cardiovascular:     Pulses: Normal pulses.  Pulmonary:     Effort: Pulmonary effort is normal.  Abdominal:     Palpations: Abdomen is soft.  Musculoskeletal:     Cervical back: Neck supple.  Skin:    General: Skin is warm.     Capillary Refill: Capillary refill takes less than 2 seconds.  Neurological:     Mental Status: She is alert. Mental status is at baseline.  Psychiatric:        Behavior: Behavior normal.        Thought Content: Thought content normal.        Judgment: Judgment normal.     Ortho Exam Examination right knee shows no joint effusion.  Minimal crepitus with range of motion which is well-preserved.  Collaterals and cruciates are stable.  No real joint line tenderness. Specialty Comments:  No specialty comments available.  Imaging: No results found.   PMFS History: Patient Active Problem List   Diagnosis Date Noted   Hemorrhoids 05/18/2021   CKD stage 3 due to type 2 diabetes  mellitus ([place]) 05/15/2021   Paroxysmal atrial fibrillation ([place]) 03/28/2021   GAD (generalized anxiety disorder) 02/01/2021   Type 2 diabetes mellitus with hyperglycemia, without long-term current use of insulin ([place]) 02/01/2021   Injury of right elbow 01/17/2020   Monitoring for long-term anticoagulant use 08/24/2019   Encounter for therapeutic drug monitoring 07/27/2019   Pain in both lower extremities 07/23/2019   COPD (chronic obstructive pulmonary disease) ([place]) 07/07/2019   Atrial fibrillation with rapid ventricular response ([place]) 02/04/2019   Exertional dyspnea 11/29/2018   Hx of CABG 11/29/2018   Coronary artery disease involving coronary bypass graft of native heart with angina pectoris ([place]) 08/14/2018   Angina pectoris ([place]) 07/24/2018   Pulmonary  hypertension, unspecified ([place]) 07/21/2018   Status post mechanical aortic valve replacement 07/17/2018   H/O mitral valve replacement with mechanical valve 07/17/2018   Coronary artery disease of bypass graft of native heart with stable angina pectoris ([place]) 07/17/2018   Subtherapeutic international normalized ratio (INR) 07/17/2018   Atherosclerotic heart disease of native coronary artery with unspecified angina pectoris ([place]) 06/05/2018   Exertional chest pain 06/05/2018   Long term (current) use of anticoagulants 01/08/2017   PVCs (premature ventricular contractions) 11/17/2015   Aortic valve insufficiency    Mitral valve insufficiency    Mitral stenosis    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    Essential hypertension 06/29/2015   Murmur 06/29/2015   Palpitations 06/29/2015   Past Medical History:  Diagnosis Date   CKD stage 3 due to type 2 diabetes mellitus ([place]) 05/15/2021   COVID-19    Hyperlipidemia    Hypertension    Rheumatic heart disease    MS/ AS    Family History  Problem Relation Age of Onset   Cancer Mother    Hypertension Mother    Diabetes Mother    Hyperlipidemia Mother    Heart disease Mother    Cancer Father    Hypertension Father    Diabetes Father    Heart disease Father    Hypertension Sister    Hypertension Brother    Hypertension Brother    Schizophrenia Maternal Grandmother     Past Surgical History:  Procedure Laterality Date   CARDIAC CATHETERIZATION     CHOLECYSTECTOMY     CORONARY/GRAFT ANGIOGRAPHY N/A 08/18/2018   Procedure: CORONARY/GRAFT ANGIOGRAPHY;  Surgeon: Nigel Mormon, MD;  Location: [facility];  Service: Cardiovascular;  Laterality: N/A;   LEG SURGERY     "metal plate in leg"   RIGHT HEART CATH AND CORONARY/GRAFT ANGIOGRAPHY N/A 07/21/2018   Procedure: RIGHT HEART CATH AND CORONARY/GRAFT ANGIOGRAPHY;  Surgeon: Nigel Mormon, MD;  Location: [facility];  Service: Cardiovascular;  Laterality:  N/A;   TEE WITHOUT CARDIOVERSION N/A 07/27/2015   Procedure: TRANSESOPHAGEAL ECHOCARDIOGRAM (TEE);  Surgeon: Sanda Klein, MD;  Location: [facility] END[facility]COPY;  Service: Cardiovascular;  Laterality: N/A;   TEE WITHOUT CARDIOVERSION N/A 07/21/2018   Procedure: TRANSESOPHAGEAL ECHOCARDIOGRAM (TEE);  Surgeon: Nigel Mormon, MD;  Location: [facility] ENDOSCOPY;  Service: Cardiovascular;  Laterality: N/A;   TONSILLECTOMY AND ADENOIDECTOMY     Social History   Occupational History   Not on file  Tobacco Use   Smoking status: Former    Packs/day: 1.50    Years: 40.00    Total pack years: 60.00    Types: Cigarettes    Quit date: 12/23/2016    Years since quitting: 4.7   Smokeless tobacco: Never  Vaping Use   Vaping Use:  Never used  Substance and Sexual Activity   Alcohol use: Not Currently   Drug use: No   Sexual activity: Not Currently

## 2021-10-08 DIAGNOSIS — Z952 Presence of prosthetic heart valve: Secondary | ICD-10-CM | POA: Diagnosis not present

## 2021-10-08 DIAGNOSIS — Z7901 Long term (current) use of anticoagulants: Secondary | ICD-10-CM | POA: Diagnosis not present

## 2021-10-08 DIAGNOSIS — I480 Paroxysmal atrial fibrillation: Secondary | ICD-10-CM | POA: Diagnosis not present

## 2021-10-10 ENCOUNTER — Ambulatory Visit (INDEPENDENT_AMBULATORY_CARE_PROVIDER_SITE_OTHER): Payer: Medicare Other

## 2021-10-10 ENCOUNTER — Telehealth: Payer: Self-pay | Admitting: Nurse Practitioner

## 2021-10-10 DIAGNOSIS — Z0000 Encounter for general adult medical examination without abnormal findings: Secondary | ICD-10-CM | POA: Diagnosis not present

## 2021-10-10 NOTE — Patient Instructions (Signed)
Kimberly Hobbs , Thank you for taking time to come for your Medicare Wellness Visit. I appreciate your ongoing commitment to your health goals. Please review the following plan we discussed and let me know if I can assist you in the future.   Screening recommendations/referrals: Colonoscopy: 02/05/2016  due 01/2026 Mammogram: 08/15/2021 Bone Density: Not of age  Recommended yearly ophthalmology/optometry visit for glaucoma screening and checkup Recommended yearly dental visit for hygiene and checkup  Vaccinations: Influenza vaccine: completed  Pneumococcal vaccine: completed  Tdap vaccine: due  Shingles vaccine: completed per patient     Advanced directives: none   Conditions/risks identified: none   Next appointment: none   Preventive Care 40-64 Years, Female Preventive care refers to lifestyle choices and visits with your health care provider that can promote health and wellness. What does preventive care include? A yearly physical exam. This is also called an annual well check. Dental exams once or twice a year. Routine eye exams. Ask your health care provider how often you should have your eyes checked. Personal lifestyle choices, including: Daily care of your teeth and gums. Regular physical activity. Eating a healthy diet. Avoiding tobacco and drug use. Limiting alcohol use. Practicing safe sex. Taking low-dose aspirin daily starting at age 80. Taking vitamin and mineral supplements as recommended by your health care provider. What happens during an annual well check? The services and screenings done by your health care provider during your annual well check will depend on your age, overall health, lifestyle risk factors, and family history of disease. Counseling  Your health care provider may ask you questions about your: Alcohol use. Tobacco use. Drug use. Emotional well-being. Home and relationship well-being. Sexual activity. Eating habits. Work and work  Statistician. Method of birth control. Menstrual cycle. Pregnancy history. Screening  You may have the following tests or measurements: Height, weight, and BMI. Blood pressure. Lipid and cholesterol levels. These may be checked every 5 years, or more frequently if you are over 23 years old. Skin check. Lung cancer screening. You may have this screening every year starting at age 82 if you have a 30-pack-year history of smoking and currently smoke or have quit within the past 15 years. Fecal occult blood test (FOBT) of the stool. You may have this test every year starting at age 14. Flexible sigmoidoscopy or colonoscopy. You may have a sigmoidoscopy every 5 years or a colonoscopy every 10 years starting at age 31. Hepatitis C blood test. Hepatitis B blood test. Sexually transmitted disease (STD) testing. Diabetes screening. This is done by checking your blood sugar (glucose) after you have not eaten for a while (fasting). You may have this done every 1-3 years. Mammogram. This may be done every 1-2 years. Talk to your health care provider about when you should start having regular mammograms. This may depend on whether you have a family history of breast cancer. BRCA-related cancer screening. This may be done if you have a family history of breast, ovarian, tubal, or peritoneal cancers. Pelvic exam and Pap test. This may be done every 3 years starting at age 49. Starting at age 85, this may be done every 5 years if you have a Pap test in combination with an HPV test. Bone density scan. This is done to screen for osteoporosis. You may have this scan if you are at high risk for osteoporosis. Discuss your test results, treatment options, and if necessary, the need for more tests with your health care provider. Vaccines  Your health  care provider may recommend certain vaccines, such as: Influenza vaccine. This is recommended every year. Tetanus, diphtheria, and acellular pertussis (Tdap, Td)  vaccine. You may need a Td booster every 10 years. Zoster vaccine. You may need this after age 76. Pneumococcal 13-valent conjugate (PCV13) vaccine. You may need this if you have certain conditions and were not previously vaccinated. Pneumococcal polysaccharide (PPSV23) vaccine. You may need one or two doses if you smoke cigarettes or if you have certain conditions. Talk to your health care provider about which screenings and vaccines you need and how often you need them. This information is not intended to replace advice given to you by your health care provider. Make sure you discuss any questions you have with your health care provider. Document Released: 03/03/2015 Document Revised: 10/25/2015 Document Reviewed: 12/06/2014 Elsevier Interactive Patient Education  2017 [place] Prevention in the Home Falls can cause injuries. They can happen to people of all ages. There are many things you can do to make your home safe and to help prevent falls. What can I do on the outside of my home? Regularly fix the edges of walkways and driveways and fix any cracks. Remove anything that might make you trip as you walk through a door, such as a raised step or threshold. Trim any bushes or trees on the path to your home. Use bright outdoor lighting. Clear any walking paths of anything that might make someone trip, such as rocks or tools. Regularly check to see if handrails are loose or broken. Make sure that both sides of any steps have handrails. Any raised decks and porches should have guardrails on the edges. Have any leaves, snow, or ice cleared regularly. Use sand or salt on walking paths during winter. Clean up any spills in your garage right away. This includes oil or grease spills. What can I do in the bathroom? Use night lights. Install grab bars by the toilet and in the tub and shower. Do not use towel bars as grab bars. Use non-skid mats or decals in the tub or shower. If you  need to sit down in the shower, use a plastic, non-slip stool. Keep the floor dry. Clean up any water that spills on the floor as soon as it happens. Remove soap buildup in the tub or shower regularly. Attach bath mats securely with double-sided non-slip rug tape. Do not have throw rugs and other things on the floor that can make you trip. What can I do in the bedroom? Use night lights. Make sure that you have a light by your bed that is easy to reach. Do not use any sheets or blankets that are too big for your bed. They should not hang down onto the floor. Have a firm chair that has side arms. You can use this for support while you get dressed. Do not have throw rugs and other things on the floor that can make you trip. What can I do in the kitchen? Clean up any spills right away. Avoid walking on wet floors. Keep items that you use a lot in easy-to-reach places. If you need to reach something above you, use a strong step stool that has a grab bar. Keep electrical cords out of the way. Do not use floor polish or wax that makes floors slippery. If you must use wax, use non-skid floor wax. Do not have throw rugs and other things on the floor that can make you trip. What can I  do with my stairs? Do not leave any items on the stairs. Make sure that there are handrails on both sides of the stairs and use them. Fix handrails that are broken or loose. Make sure that handrails are as long as the stairways. Check any carpeting to make sure that it is firmly attached to the stairs. Fix any carpet that is loose or worn. Avoid having throw rugs at the top or bottom of the stairs. If you do have throw rugs, attach them to the floor with carpet tape. Make sure that you have a light switch at the top of the stairs and the bottom of the stairs. If you do not have them, ask someone to add them for you. What else can I do to help prevent falls? Wear shoes that: Do not have high heels. Have rubber  bottoms. Are comfortable and fit you well. Are closed at the toe. Do not wear sandals. If you use a stepladder: Make sure that it is fully opened. Do not climb a closed stepladder. Make sure that both sides of the stepladder are locked into place. Ask someone to hold it for you, if possible. Clearly mark and make sure that you can see: Any grab bars or handrails. First and last steps. Where the edge of each step is. Use tools that help you move around (mobility aids) if they are needed. These include: Canes. Walkers. Scooters. Crutches. Turn on the lights when you go into a dark area. Replace any light bulbs as soon as they burn out. Set up your furniture so you have a clear path. Avoid moving your furniture around. If any of your floors are uneven, fix them. If there are any pets around you, be aware of where they are. Review your medicines with your doctor. Some medicines can make you feel dizzy. This can increase your chance of falling. Ask your doctor what other things that you can do to help prevent falls. This information is not intended to replace advice given to you by your health care provider. Make sure you discuss any questions you have with your health care provider. Document Released: 12/01/2008 Document Revised: 07/13/2015 Document Reviewed: 03/11/2014 Elsevier Interactive Patient Education  2017 [employer].

## 2021-10-10 NOTE — Telephone Encounter (Signed)
Chart sup/ports Rx Last OV: 09/2021 Next OV: 10/2021

## 2021-10-10 NOTE — Telephone Encounter (Signed)
Caller Name: Kristyne Call back phone #: [phone]  MEDICATION(S): March Rummage 2.5-2.5 MCG/ACT AERS [[id number]]   Days of Med Remaining:   Has the patient contacted their pharmacy (YES/NO)?  What did pharmacy advise?  Preferred Pharmacy:  [facility] [place], [place] - [address] AT [address] Phone:  [phone]         ~~~Please advise patient/caregiver to allow 2-3 business days to process RX refills.

## 2021-10-10 NOTE — Progress Notes (Signed)
Subjective:   Kimberly Hobbs is a 61 y.o. female who presents for an Initial Medicare Annual Wellness Visit.   I connected with [place]  today by telephone and verified that I am speaking with the correct person using two identifiers. Location patient: home Location provider: work Persons participating in the virtual visit: patient, provider.   I discussed the limitations, risks, security and privacy concerns of performing an evaluation and management service by telephone and the availability of in person appointments. I also discussed with the patient that there may be a patient responsible charge related to this service. The patient expressed understanding and verbally consented to this telephonic visit.    Interactive audio and video telecommunications were attempted between this provider and patient, however failed, due to patient having technical difficulties OR patient did not have access to video capability.  We continued and completed visit with audio only.    Review of Systems     Cardiac Risk Factors include: advanced age (>70mn, >>27women)     Objective:    Today's Vitals   There is no height or weight on file to calculate BMI.     10/10/2021   11:01 AM 03/15/2019   11:05 AM 08/18/2018    8:39 AM 07/21/2018   11:50 AM 12/09/2017   10:21 AM 07/27/2015    9:28 AM  Advanced Directives  Does Patient Have a Medical Advance Directive? No No No No Yes No  Does patient want to make changes to medical advance directive?     No - Patient declined   Would patient like information on creating a medical advance directive? No - Patient declined No - Patient declined No - Patient declined No - Patient declined  No - patient declined information    Current Medications (verified) Outpatient Encounter Medications as of 10/10/2021  Medication Sig   acetaminophen (TYLENOL) 500 MG tablet Take 500 mg by mouth every 6 (six) hours as needed (for pain.).   amiodarone (PACERONE)  200 MG tablet Take 1 tablet (200 mg total) by mouth daily.   aspirin EC 81 MG tablet Take 81 mg by mouth daily.    citalopram (CELEXA) 40 MG tablet Take 0.5 tablets (20 mg total) by mouth 2 (two) times daily.   clonazePAM (KLONOPIN) 0.5 MG tablet Take 1.5 tablets (0.75 mg total) by mouth at bedtime.   diltiazem (CARDIZEM CD) 180 MG 24 hr capsule TAKE 1 CAPSULE BY MOUTH DAILY   furosemide (LASIX) 20 MG tablet TAKE 1 TABLET BY MOUTH DAILY   isosorbide mononitrate (IMDUR) 60 MG 24 hr tablet TAKE 1 TABLET(60 MG) BY MOUTH DAILY   Lancets (ONETOUCH DELICA PLUS [id number] MISC TAKE AS DIRECTED ONCE DAILY.   levalbuterol (XOPENEX HFA) 45 MCG/ACT inhaler Inhale 1-2 puffs into the lungs every 6 (six) hours as needed for shortness of breath.   losartan (COZAAR) 50 MG tablet Take 1 tablet (50 mg total) by mouth daily.   metFORMIN (GLUCOPHAGE) 500 MG tablet Take 1 tablet (500 mg total) by mouth 2 (two) times daily with a meal.   metoprolol succinate (TOPROL-XL) 25 MG 24 hr tablet Take 1 tablet (25 mg total) by mouth daily. Take with or immediately following a meal.   nitroGLYCERIN (NITROSTAT) 0.4 MG SL tablet Place 1 tablet (0.4 mg total) under the tongue every 5 (five) minutes x 3 doses as needed for chest pain.   ONETOUCH VERIO test strip AS DIRECTED ONCE A DAY   pantoprazole (PROTONIX) 40 MG tablet  Take 1 tablet (40 mg total) by mouth daily.   rosuvastatin (CRESTOR) 40 MG tablet Take 1 tablet (40 mg total) by mouth daily.   spironolactone (ALDACTONE) 50 MG tablet TAKE 1 TABLET(50 MG) BY MOUTH DAILY   STIOLTO RESPIMAT 2.5-2.5 MCG/ACT AERS INHALE 2 PUFFS INTO THE LUNGS DAILY   warfarin (COUMADIN) 5 MG tablet TAKE 1 TABLET BY MOUTH DAILY   warfarin (COUMADIN) 7.5 MG tablet TAKE 1 TABLET BY MOUTH EVERY EVENING (Patient taking differently: TAKE 1 TABLET BY MOUTH EVERY EVENING Or as directed by coumadin clinic)   No facility-administered encounter medications on file as of 10/10/2021.    Allergies  (verified) Iodinated contrast media and Penicillins   History: Past Medical History:  Diagnosis Date   CKD stage 3 due to type 2 diabetes mellitus ([place]) 05/15/2021   COVID-19    Hyperlipidemia    Hypertension    Rheumatic heart disease    MS/ AS   Past Surgical History:  Procedure Laterality Date   CARDIAC CATHETERIZATION     CHOLECYSTECTOMY     CORONARY/GRAFT ANGIOGRAPHY N/A 08/18/2018   Procedure: CORONARY/GRAFT ANGIOGRAPHY;  Surgeon: Nigel Mormon, MD;  Location: [place] [facility];  Service: Cardiovascular;  Laterality: N/A;   LEG SURGERY     "metal plate in leg"   RIGHT HEART CATH AND CORONARY/GRAFT ANGIOGRAPHY N/A 07/21/2018   Procedure: RIGHT HEART CATH AND CORONARY/GRAFT ANGIOGRAPHY;  Surgeon: Nigel Mormon, MD;  Location: [place] [facility];  Service: Cardiovascular;  Laterality: N/A;   TEE WITHOUT CARDIOVERSION N/A 07/27/2015   Procedure: TRANSESOPHAGEAL ECHOCARDIOGRAM (TEE);  Surgeon: Sanda Klein, MD;  Location: [facility];  Service: Cardiovascular;  Laterality: N/A;   TEE WITHOUT CARDIOVERSION N/A 07/21/2018   Procedure: TRANSESOPHAGEAL ECHOCARDIOGRAM (TEE);  Surgeon: Nigel Mormon, MD;  Location: [facility] ENDOSCOPY;  Service: Cardiovascular;  Laterality: N/A;   TONSILLECTOMY AND ADENOIDECTOMY     Family History  Problem Relation Age of Onset   Cancer Mother    Hypertension Mother    Diabetes Mother    Hyperlipidemia Mother    Heart disease Mother    Cancer Father    Hypertension Father    Diabetes Father    Heart disease Father    Hypertension Sister    Hypertension Brother    Hypertension Brother    Schizophrenia Maternal Grandmother    Social History   Socioeconomic History   Marital status: Divorced    Spouse name: Not on file   Number of children: 4   Years of education: Not on file   Highest education level: GED or equivalent  Occupational History   Not on file  Tobacco Use   Smoking status: Former    Packs/day: 1.50    Years:  40.00    Total pack years: 60.00    Types: Cigarettes    Quit date: 12/23/2016    Years since quitting: 4.8   Smokeless tobacco: Never  Vaping Use   Vaping Use: Never used  Substance and Sexual Activity   Alcohol use: Not Currently   Drug use: No   Sexual activity: Not Currently  Other Topics Concern   Not on file  Social History Narrative   ** Merged History Encounter **       Epworth Sleepiness Scale = 4 (as of 06/28/2015)   Social Determinants of Health   Financial Resource Strain: Low Risk  (10/10/2021)   Overall Financial Resource Strain (CARDIA)    Difficulty of Paying Living Expenses: Not hard at  all  Food Insecurity: No Food Insecurity (10/10/2021)   Hunger Vital Sign    Worried About Running Out of Food in the Last Year: Never true    Ran Out of Food in the Last Year: Never true  Transportation Needs: No Transportation Needs (10/10/2021)   PRAPARE - Hydrologist (Medical): No    Lack of Transportation (Non-Medical): No  Physical Activity: Inactive (10/10/2021)   Exercise Vital Sign    Days of Exercise per Week: 0 days    Minutes of Exercise per Session: 0 min  Stress: No Stress Concern Present (10/10/2021)   [place]    Feeling of Stress : Not at all  Social Connections: Moderately Integrated (10/10/2021)   Social Connection and Isolation Panel [NHANES]    Frequency of Communication with Friends and Family: Three times a week    Frequency of Social Gatherings with Friends and Family: Three times a week    Attends Religious Services: Never    Active Member of Clubs or Organizations: Yes    Attends Archivist Meetings: 1 to 4 times per year    Marital Status: Living with partner    Tobacco Counseling Counseling given: Not Answered   Clinical Intake:  Pre-visit preparation completed: Yes  Pain : No/denies pain     Nutritional Risks: None Diabetes:  No  How often do you need to have someone help you when you read instructions, pamphlets, or other written materials from your doctor or pharmacy?: 1 - Never What is the last grade level you completed in school?: GED  Diabetic?yes  Nutrition Risk Assessment:  Has the patient had any N/V/D within the last 2 months?  No  Does the patient have any non-healing wounds?  No  Has the patient had any unintentional weight loss or weight gain?  No   Diabetes:  Is the patient diabetic?  Yes  If diabetic, was a CBG obtained today?  No  Did the patient bring in their glucometer from home?  No  How often do you monitor your CBG's? Once a day .   Financial Strains and Diabetes Management:  Are you having any financial strains with the device, your supplies or your medication? No .  Does the patient want to be seen by Chronic Care Management for management of their diabetes?  No  Would the patient like to be referred to a Nutritionist or for Diabetic Management?  No   Diabetic Exams:  Diabetic Eye Exam: Overdue for diabetic eye exam. Pt has been advised about the importance in completing this exam. Patient advised to call and schedule an eye exam. Diabetic Foot Exam: Overdue, Pt has been advised about the importance in completing this exam. Pt is scheduled for diabetic foot exam on next office visit .   Interpreter Needed?: No  Information entered by :: L.Wilson,LPN   Activities of Daily Living    10/10/2021   11:05 AM  In your present state of health, do you have any difficulty performing the following activities:  Hearing? 0  Vision? 0  Difficulty concentrating or making decisions? 0  Walking or climbing stairs? 0  Dressing or bathing? 0  Doing errands, shopping? 0  Preparing Food and eating ? N  Using the Toilet? N  In the past six months, have you accidently leaked urine? N  Do you have problems with loss of bowel control? N  Managing your Medications? N  Managing your  Finances? N  Housekeeping or managing your Housekeeping? N    Patient Care Team: Nche, Charlene Brooke, NP as PCP - General (Internal Medicine)  Indicate any recent Medical Services you may have received from other than [facility] providers in the past year (date may be approximate).     Assessment:   This is a routine wellness examination for Yasheka.  Hearing/Vision screen Vision Screening - Comments:: Patient due for Diabetic eye exam , She states she will call schedule   Dietary issues and exercise activities discussed: Current Exercise Habits: Home exercise routine, Type of exercise: walking;strength training/weights, Time (Minutes): 30, Frequency (Times/Week): 3, Weekly Exercise (Minutes/Week): 90, Intensity: Mild, Exercise limited by: None identified   Goals Addressed             This Visit's Progress    Exercise 3x per week (30 min per time)         Depression Screen    10/10/2021   11:05 AM 10/10/2021   11:00 AM 10/10/2021   10:59 AM 06/01/2021    7:45 AM 05/10/2021    9:38 AM 04/13/2021   10:06 AM 02/01/2021   10:47 AM  PHQ 2/9 Scores  PHQ - 2 Score 0 '1 1  3  2  '$ PHQ- 9 Score     13  6     Information is confidential and restricted. Go to Review Flowsheets to unlock data.    Fall Risk    10/10/2021   11:01 AM 02/01/2021   10:37 AM 03/15/2019   11:05 AM 12/09/2017   10:22 AM  Fall Risk   Falls in the past year? 0 0 0 No  Number falls in past yr: 0 0    Injury with Fall? 0 0    Risk for fall due to :  No Fall Risks    Follow up Falls evaluation completed;Education provided Falls evaluation completed      [place]:  Any stairs in or around the home? No  If so, are there any without handrails? No  Home free of loose throw rugs in walkways, pet beds, electrical cords, etc? Yes  Adequate lighting in your home to reduce risk of falls? Yes   ASSISTIVE DEVICES UTILIZED TO PREVENT FALLS:  Life alert? No  Use of a cane, walker or  w/c? No  Grab bars in the bathroom? No  Shower chair or bench in shower? Yes  Elevated toilet seat or a handicapped toilet? No     Cognitive Function:    Normal cognitive status assessed by telephone conversation by this Nurse Health Advisor. No abnormalities found.      10/10/2021   11:07 AM  6CIT Screen  What Year? 0 points  What month? 0 points  What time? 0 points  Count back from 20 0 points  Months in reverse 0 points  Repeat phrase 0 points  Total Score 0 points    Immunizations Immunization History  Administered Date(s) Administered   Fluad Quad(high Dose 65+) 02/01/2021   Influenza-Unspecified 01/14/2019    TDAP status: Due, Education has been provided regarding the importance of this vaccine. Advised may receive this vaccine at local pharmacy or Health Dept. Aware to provide a copy of the vaccination record if obtained from local pharmacy or Health Dept. Verbalized acceptance and understanding.  Flu Vaccine status: Up to date  Pneumococcal vaccine status: Up to date  Covid-19 vaccine status: Completed vaccines  Qualifies for Shingles Vaccine?  Yes   Zostavax completed No   Shingrix Completed?: No.    Education has been provided regarding the importance of this vaccine. Patient has been advised to call insurance company to determine out of pocket expense if they have not yet received this vaccine. Advised may also receive vaccine at local pharmacy or Health Dept. Verbalized acceptance and understanding.  Screening Tests Health Maintenance  Topic Date Due   COVID-19 Vaccine (1) Never done   INFLUENZA VACCINE  09/18/2021   Zoster Vaccines- Shingrix (1 of 2) 01/18/2022 (Originally 08/23/2011)   TETANUS/TDAP  02/01/2022 (Originally 08/22/1980)   Hepatitis C Screening  05/11/2022 (Originally 08/23/1979)   HIV Screening  05/11/2022 (Originally 08/22/1976)   FOOT EXAM  02/01/2022   HEMOGLOBIN A1C  02/14/2022   OPHTHALMOLOGY EXAM  07/14/2022   MAMMOGRAM  07/27/2022    PAP SMEAR-Modifier  05/10/2024   COLONOSCOPY (Pts 45-26yr Insurance coverage will need to be confirmed)  02/04/2026   HPV VACCINES  Aged Out    Health Maintenance  Health Maintenance Due  Topic Date Due   COVID-19 Vaccine (1) Never done   INFLUENZA VACCINE  09/18/2021    Colorectal cancer screening: Type of screening: Colonoscopy. Completed 12/018/2017. Repeat every 10 years  Mammogram status: Completed 08/15/2021. Repeat every year  Bone Density status: Ordered not of age . Pt provided with contact info and advised to call to schedule appt.  Lung Cancer Screening: (Low Dose CT Chest recommended if Age 60-80years, 30 pack-year currently smoking OR have quit w/in 15years.) does not qualify.   Lung Cancer Screening Referral: n/a  Additional Screening:  Hepatitis C Screening: does not qualify;   Vision Screening: Recommended annual ophthalmology exams for early detection of glaucoma and other disorders of the eye. Is the patient up to date with their annual eye exam?  Yes  Who is the provider or what is the name of the office in which the patient attends annual eye exams? Patient to call schedule eye appointment  If pt is not established with a provider, would they like to be referred to a provider to establish care? No .   Dental Screening: Recommended annual dental exams for proper oral hygiene  Community Resource Referral / Chronic Care Management: CRR required this visit?  No   CCM required this visit?  No      Plan:     I have personally reviewed and noted the following in the patient's chart:   Medical and social history Use of alcohol, tobacco or illicit drugs  Current medications and supplements including opioid prescriptions. Patient is not currently taking opioid prescriptions. Functional ability and status Nutritional status Physical activity Advanced directives List of other physicians Hospitalizations, surgeries, and ER visits in previous 12  months Vitals Screenings to include cognitive, depression, and falls Referrals and appointments  In addition, I have reviewed and discussed with patient certain preventive protocols, quality metrics, and best practice recommendations. A written personalized care plan for preventive services as well as general preventive health recommendations were provided to patient.     LDaphane Shepherd LPN   [id number]  Nurse Notes: none

## 2021-10-12 ENCOUNTER — Telehealth: Payer: Self-pay | Admitting: Internal Medicine

## 2021-10-12 DIAGNOSIS — R0609 Other forms of dyspnea: Secondary | ICD-10-CM

## 2021-10-15 MED ORDER — STIOLTO RESPIMAT 2.5-2.5 MCG/ACT IN AERS: 2.0000 | INHALATION_SPRAY | Freq: Every day | RESPIRATORY_TRACT | 0 refills | Status: DC

## 2021-10-15 NOTE — Telephone Encounter (Signed)
Pt not seen since 2021  She has appt with ND on 10/29/21  I refilled x 1 only and advised pt needs to keep her appt for refills  She verbalized understanding  Nothing further needed

## 2021-10-24 ENCOUNTER — Ambulatory Visit: Payer: Medicare Other | Admitting: Orthopaedic Surgery

## 2021-10-24 DIAGNOSIS — H903 Sensorineural hearing loss, bilateral: Secondary | ICD-10-CM | POA: Diagnosis not present

## 2021-10-29 ENCOUNTER — Encounter: Payer: Self-pay | Admitting: Cardiology

## 2021-10-29 ENCOUNTER — Ambulatory Visit (INDEPENDENT_AMBULATORY_CARE_PROVIDER_SITE_OTHER): Payer: Medicare Other | Admitting: Internal Medicine

## 2021-10-29 ENCOUNTER — Encounter: Payer: Self-pay | Admitting: Internal Medicine

## 2021-10-29 VITALS — BP 128/74 | HR 56 | Temp 98.3°F | Ht 65.0 in | Wt 216.0 lb

## 2021-10-29 DIAGNOSIS — R0609 Other forms of dyspnea: Secondary | ICD-10-CM | POA: Diagnosis not present

## 2021-10-29 DIAGNOSIS — J449 Chronic obstructive pulmonary disease, unspecified: Secondary | ICD-10-CM

## 2021-10-29 DIAGNOSIS — J310 Chronic rhinitis: Secondary | ICD-10-CM

## 2021-10-29 MED ORDER — LEVALBUTEROL HCL 0.63 MG/3ML IN NEBU: 0.6300 mg | INHALATION_SOLUTION | RESPIRATORY_TRACT | 12 refills | Status: DC | PRN

## 2021-10-29 MED ORDER — LEVALBUTEROL TARTRATE 45 MCG/ACT IN AERO: 1.0000 | INHALATION_SPRAY | Freq: Four times a day (QID) | RESPIRATORY_TRACT | 12 refills | Status: DC | PRN

## 2021-10-29 MED ORDER — STIOLTO RESPIMAT 2.5-2.5 MCG/ACT IN AERS: 2.0000 | INHALATION_SPRAY | Freq: Every day | RESPIRATORY_TRACT | 0 refills | Status: DC

## 2021-10-29 MED ORDER — CETIRIZINE HCL 10 MG PO TABS: 10.0000 mg | ORAL_TABLET | Freq: Every day | ORAL | 11 refills | Status: DC

## 2021-10-29 NOTE — Progress Notes (Signed)
Kimberly Hobbs    [id number]    [birth year]  Primary Care Physician:Nche, Charlene Brooke, NP Date of Appointment: 10/29/2021 Established Patient Visit  Chief complaint:   Chief Complaint  Patient presents with   Follow-up    Productive cough x 2- 3 weeks     HPI: Kimberly Hobbs is a 61 y.o. woman with previous tobacco use disorder 60 pack years, quit 2018. Gold stage 0 based on PFTs. Additional PMH CAD s/p CABG, mechanical AVR and MVR 2018, A.fib on Coumadin.  Interval Updates: Here for follow up after over 2 years. Has been on stiolto and prn xopanex.   Last month having worsening chest tightness, wheezing. Doesn't feel like the stiolto inhaler is helping much. Not using xopanex inhaler   No fevers or chills. Having worsening productive cough with tan mucus.  Cough is usually at night. She does have reflux  and feels it is well controlled on current therapy.   Also having worsening post nasal drainage. Used to take flonase but used to get soreness inside her nasal mucosa.   Lives with multiple animals - 9 dogs and 1 cat. Unclear if she is allergic to dogs or cats.   No interval hospitalizations or emergency room visits since the last 2 years.   I have reviewed the patient's family social and past medical history and updated as appropriate.   Past Medical History:  Diagnosis Date   CKD stage 3 due to type 2 diabetes mellitus ([place]) 05/15/2021   COVID-19    Hyperlipidemia    Hypertension    Rheumatic heart disease    MS/ AS    Past Surgical History:  Procedure Laterality Date   CARDIAC CATHETERIZATION     CHOLECYSTECTOMY     CORONARY/GRAFT ANGIOGRAPHY N/A 08/18/2018   Procedure: CORONARY/GRAFT ANGIOGRAPHY;  Surgeon: Nigel Mormon, MD;  Location: [facility];  Service: Cardiovascular;  Laterality: N/A;   LEG SURGERY     "metal plate in leg"   RIGHT HEART CATH AND CORONARY/GRAFT ANGIOGRAPHY N/A 07/21/2018   Procedure: RIGHT HEART CATH AND  CORONARY/GRAFT ANGIOGRAPHY;  Surgeon: Nigel Mormon, MD;  Location: [facility];  Service: Cardiovascular;  Laterality: N/A;   TEE WITHOUT CARDIOVERSION N/A 07/27/2015   Procedure: TRANSESOPHAGEAL ECHOCARDIOGRAM (TEE);  Surgeon: Sanda Klein, MD;  Location: [facility] ENDOSCOPY;  Service: Cardiovascular;  Laterality: N/A;   TEE WITHOUT CARDIOVERSION N/A 07/21/2018   Procedure: TRANSESOPHAGEAL ECHOCARDIOGRAM (TEE);  Surgeon: Nigel Mormon, MD;  Location: [facility] ENDOSCOPY;  Service: Cardiovascular;  Laterality: N/A;   TONSILLECTOMY AND ADENOIDECTOMY      Family History  Problem Relation Age of Onset   Cancer Mother    Hypertension Mother    Diabetes Mother    Hyperlipidemia Mother    Heart disease Mother    Cancer Father    Hypertension Father    Diabetes Father    Heart disease Father    Hypertension Sister    Hypertension Brother    Hypertension Brother    Schizophrenia Maternal Grandmother     Social History   Occupational History   Not on file  Tobacco Use   Smoking status: Former    Packs/day: 1.50    Years: 40.00    Total pack years: 60.00    Types: Cigarettes    Quit date: 12/23/2016    Years since quitting: 4.8   Smokeless tobacco: Never  Vaping Use   Vaping Use: Never used  Substance and Sexual Activity   Alcohol use: Not Currently   Drug use: No   Sexual activity: Not Currently     Physical Exam: Blood pressure 128/74, pulse (!) 56, temperature 98.3 F (36.8 C), temperature source Oral, height '5\' 5"'$  (1.651 m), weight 216 lb (98 kg), SpO2 97 %.  Gen:      No acute distress, harsh barking cough.  ENT:  +cobblestoning in oropharynx, no nasal polyps, mucus membranes moist Lungs:    No increased respiratory effort, symmetric chest wall excursion, clear to auscultation bilaterally, no wheezes or crackles CV:         Regular rate and rhythm; no murmurs, rubs, or gallops.  No pedal edema   Data Reviewed: Imaging: I have personally reviewed the    PFTs:     Latest Ref Rng & Units 07/07/2019    8:58 AM  PFT Results  FVC-Pre L 2.60   FVC-Predicted Pre % 74   FVC-Post L 2.83   FVC-Predicted Post % 80   Pre FEV1/FVC % % 77   Post FEV1/FCV % % 79   FEV1-Pre L 2.00   FEV1-Predicted Pre % 73   FEV1-Post L 2.22   DLCO uncorrected ml/min/mmHg 17.22   DLCO UNC% % 81   DLCO corrected ml/min/mmHg 17.22   DLCO COR %Predicted % 81   DLVA Predicted % 89   TLC L 5.58   TLC % Predicted % 107   RV % Predicted % 138    I have personally reviewed the patient's PFTs and no airflow limitation but with bd response to BD  Labs:  Immunization status: Immunization History  Administered Date(s) Administered   Fluad Quad(high Dose 65+) 02/01/2021   Influenza-Unspecified 01/14/2019    External Records Personally Reviewed: pulmonary, pcp, cardiology  Assessment:  COPD, Gold Stage 0 Chronic rhinitis History of tobacco use Need for lung cancer screening  Plan/Recommendations: Blood work today for allergies - I will release the results to you on mychart.  Your cough is likely worsened from chronic drainage from your sinuses.   Start taking cetirizine allergy pill once daily at night time - side effects: sleepiness, dry mouth  Continue stiolto inhaler with xopanex nebulizer or inhaler as needed for chest tightness and wheezing.   Continue your acid reflux medicine.   I am also referring you for our lung cancer screening program - because of your smoking history you're at a higher risk for lung cancer and we need to keep an eye on this. Our office will call you to schedule this.     Return to Care: Return in about 6 months (around 04/29/2022).   Lenice Llamas, MD Pulmonary and [place]

## 2021-10-29 NOTE — Patient Instructions (Addendum)
Please schedule follow up scheduled with myself in 6 months.  If my schedule is not open yet, we will contact you with a reminder closer to that time. Please call [phone] if you haven't heard from [place] a month before.   Before your next visit I would like you to have: Blood work today for allergies - I will release the results to you on mychart.  Your cough is likely worsened from chronic drainage from your sinuses.   Start taking cetirizine allergy pill once daily at night time - side effects: sleepiness, dry mouth  Continue stiolto inhaler with xopanex nebulizer or inhaler as needed for chest tightness and wheezing.   We will order a nebulizer machine to be sent to your home.   Continue your acid reflux medicine.   I am also referring you for our lung cancer screening program - because of your smoking history you're at a higher risk for lung cancer and we need to keep an eye on this. Our office will call you to schedule this.

## 2021-10-30 LAB — RESPIRATORY ALLERGY PROFILE REGION II ~~LOC~~
Allergen, A. alternata, m6: 0.14 kU/L — ABNORMAL HIGH
Allergen, Cedar tree, t12: 0.27 kU/L — ABNORMAL HIGH
Allergen, Comm Silver Birch, t9: 0.22 kU/L — ABNORMAL HIGH
Allergen, Cottonwood, t14: 0.42 kU/L — ABNORMAL HIGH
Allergen, D pternoyssinus,d7: 0.16 kU/L — ABNORMAL HIGH
Allergen, Mouse Urine Protein, e78: <0.1 kU/L
Allergen, Mulberry, t76: 0.17 kU/L — ABNORMAL HIGH
Allergen, Oak,t7: 0.44 kU/L — ABNORMAL HIGH
Allergen, P. notatum, m1: 0.14 kU/L — ABNORMAL HIGH
Aspergillus fumigatus, m3: 0.14 kU/L — ABNORMAL HIGH
Bermuda Grass: 0.39 kU/L — ABNORMAL HIGH
Box Elder IgE: 0.44 kU/L — ABNORMAL HIGH
CLADOSPORIUM HERBARUM (M2) IGE: <0.1 kU/L
COMMON RAGWEED (SHORT) (W1) IGE: 0.37 kU/L — ABNORMAL HIGH
Cat Dander: <0.1 kU/L
Class: 0
Class: 0
Class: 0
Class: 0
Class: 1
Class: 1
Class: 1
Class: 1
Class: 1
Class: 1
Class: 1
Class: 1
Cockroach: 0.13 kU/L — ABNORMAL HIGH
D. farinae: 0.13 kU/L — ABNORMAL HIGH
Dog Dander: <0.1 kU/L
Elm IgE: 0.42 kU/L — ABNORMAL HIGH
IgE (Immunoglobulin E), Serum: 235 kU/L — ABNORMAL HIGH (ref <=114)
Johnson Grass: 0.44 kU/L — ABNORMAL HIGH
Pecan/Hickory Tree IgE: 0.28 kU/L — ABNORMAL HIGH
Rough Pigweed  IgE: 0.27 kU/L — ABNORMAL HIGH
Sheep Sorrel IgE: 0.3 kU/L — ABNORMAL HIGH
Timothy Grass: 0.48 kU/L — ABNORMAL HIGH

## 2021-10-30 LAB — INTERPRETATION:

## 2021-10-30 NOTE — Telephone Encounter (Signed)
Please advise 

## 2021-10-31 ENCOUNTER — Ambulatory Visit: Payer: Medicare Other | Admitting: Cardiology

## 2021-10-31 DIAGNOSIS — Z952 Presence of prosthetic heart valve: Secondary | ICD-10-CM

## 2021-10-31 DIAGNOSIS — Z7901 Long term (current) use of anticoagulants: Secondary | ICD-10-CM | POA: Diagnosis not present

## 2021-10-31 DIAGNOSIS — Z5181 Encounter for therapeutic drug level monitoring: Secondary | ICD-10-CM | POA: Diagnosis not present

## 2021-10-31 LAB — POCT INR: INR: 2.5 (ref 2.0–3.0)

## 2021-11-04 NOTE — Progress Notes (Signed)
Description   10/31/2021   INR today was 2.5. Patient goal level is (2.5-3.5) Prior dose was 5 mg thur and sat, and 7.5 mg all the other days. patient will continue with 5 mg thur and sat, , and 7.5 mg all the other days.   Next INR check 11/14/2021 10:00 AM  Pt also checks INR at home.  Lab Results      Component                Value               Date                      INR                      3.5 (A)             07/17/2021                INR                      3.1 (A)             07/02/2021                INR                      2.8                 06/25/2021

## 2021-11-05 DIAGNOSIS — Z7901 Long term (current) use of anticoagulants: Secondary | ICD-10-CM | POA: Diagnosis not present

## 2021-11-05 DIAGNOSIS — I480 Paroxysmal atrial fibrillation: Secondary | ICD-10-CM | POA: Diagnosis not present

## 2021-11-05 DIAGNOSIS — Z952 Presence of prosthetic heart valve: Secondary | ICD-10-CM | POA: Diagnosis not present

## 2021-11-13 ENCOUNTER — Ambulatory Visit
Admission: RE | Admit: 2021-11-13 | Discharge: 2021-11-13 | Disposition: A | Discharge status: Home / Self Care | Payer: Medicare Other | Source: Ambulatory Visit | Attending: Acute Care | Admitting: Acute Care

## 2021-11-13 DIAGNOSIS — Z87891 Personal history of nicotine dependence: Secondary | ICD-10-CM | POA: Diagnosis not present

## 2021-11-13 DIAGNOSIS — J449 Chronic obstructive pulmonary disease, unspecified: Secondary | ICD-10-CM

## 2021-11-13 DIAGNOSIS — I2510 Atherosclerotic heart disease of native coronary artery without angina pectoris: Secondary | ICD-10-CM | POA: Diagnosis not present

## 2021-11-13 DIAGNOSIS — J310 Chronic rhinitis: Secondary | ICD-10-CM | POA: Diagnosis not present

## 2021-11-13 DIAGNOSIS — K449 Diaphragmatic hernia without obstruction or gangrene: Secondary | ICD-10-CM | POA: Diagnosis not present

## 2021-11-13 DIAGNOSIS — I700 Atherosclerosis of aorta: Secondary | ICD-10-CM | POA: Diagnosis not present

## 2021-11-14 ENCOUNTER — Ambulatory Visit: Payer: Medicare Other | Admitting: Nurse Practitioner

## 2021-11-15 ENCOUNTER — Telehealth: Payer: Self-pay | Admitting: Internal Medicine

## 2021-11-15 ENCOUNTER — Telehealth: Payer: Self-pay

## 2021-11-15 DIAGNOSIS — Z952 Presence of prosthetic heart valve: Secondary | ICD-10-CM

## 2021-11-15 DIAGNOSIS — Z5181 Encounter for therapeutic drug level monitoring: Secondary | ICD-10-CM | POA: Diagnosis not present

## 2021-11-15 DIAGNOSIS — Z7901 Long term (current) use of anticoagulants: Secondary | ICD-10-CM | POA: Diagnosis not present

## 2021-11-15 LAB — POCT INR: INR: 2.4 (ref 2–3)

## 2021-11-15 NOTE — Telephone Encounter (Signed)
11/12/2021    INR today was 2.6. Patient goal level is (2.5-3.5)  Prior dose was 5 mg thur and sat, and 7.5 mg all the other days  Take 7.'5mg'$  today only instead of '5mg'$  and then patient will continue with 5 mg thur and sat, and 7.5 mg all the other days.    Pt will recheck INR 1 weeks on 10/2.  Pt is aware and understood.   Lab Results      Component                Value               Date                      INR                      2.4                 11/15/2021                INR                      2.5                 10/31/2021                INR                      3.5 (A)             07/17/2021

## 2021-11-15 NOTE — Telephone Encounter (Signed)
Description   10/31/2021   INR today was 2.4. Patient goal level is (2.5-3.5) Prior dose was 5 mg thur and sat, and 7.5 mg all the other days. Take 7.'5mg'$  today only instead of '5mg'$  and then patient will continue with 5 mg thur and sat, and 7.5 mg all the other days.   Next INR check 11/19/21  Pt also checks INR at home.  Lab Results      Component                Value               Date                      INR                      3.5 (A)             07/17/2021                INR                      3.1 (A)             07/02/2021                INR                      2.8                 06/25/2021

## 2021-11-16 NOTE — Telephone Encounter (Signed)
Patient had a CT on 09/26.   Dr. Shearon Stalls, can you please advise on her results? Thanks!

## 2021-11-19 ENCOUNTER — Telehealth: Payer: Self-pay | Admitting: Cardiology

## 2021-11-19 DIAGNOSIS — Z7901 Long term (current) use of anticoagulants: Secondary | ICD-10-CM

## 2021-11-19 DIAGNOSIS — Z952 Presence of prosthetic heart valve: Secondary | ICD-10-CM | POA: Diagnosis not present

## 2021-11-19 DIAGNOSIS — Z5181 Encounter for therapeutic drug level monitoring: Secondary | ICD-10-CM

## 2021-11-19 LAB — POCT INR: INR: 3.1 — AB (ref 2.5–3.5)

## 2021-11-19 NOTE — Telephone Encounter (Signed)
Results communicated to patient through mychart message today.

## 2021-11-19 NOTE — Telephone Encounter (Signed)
Will close encounter

## 2021-11-19 NOTE — Telephone Encounter (Signed)
Patient called to confirm appt for 10/5 for inr check. She wanted to let you know that today  (10/2) her inr was 3.1

## 2021-11-19 NOTE — Telephone Encounter (Signed)
From patient.

## 2021-11-19 NOTE — Telephone Encounter (Signed)
11/19/2021    INR today was 3.1. Patient goal level is (2.5-3.5)  Prior dose was Take 7.'5mg'$  today only instead of '5mg'$  and then patient will continue with 5 mg thur and sat, and 7.5 mg all the other days.     Lab Results      Component                Value               Date                      INR                      2.4                 11/15/2021                INR                      2.5                 10/31/2021                INR                      3.5 (A)             07/17/2021

## 2021-11-19 NOTE — Telephone Encounter (Signed)
Description   108/2/23 INR today was 3.1. Patient goal level is (2.5-3.5) Prior dose was 5 mg thur and sat, and 7.5 mg all the other days. Continue with current dose schedule.  Next INR check 11/26/21  Pt also checks INR at home.  Lab Results      Component                Value               Date                      INR                      3.5 (A)             07/17/2021                INR                      3.1 (A)             07/02/2021                INR                      2.8                 06/25/2021

## 2021-11-20 ENCOUNTER — Telehealth: Payer: Self-pay | Admitting: Nurse Practitioner

## 2021-11-20 NOTE — Telephone Encounter (Signed)
Caller Name: Aine Strycharz Call back phone #: [phone]  Reason for Call: Had a CT done on lungs, can not get over this cough. She asked if she could get a prescription for Pronechazinedm syrup

## 2021-11-21 ENCOUNTER — Telehealth: Payer: Self-pay | Admitting: Internal Medicine

## 2021-11-21 NOTE — Telephone Encounter (Signed)
Called and spoke with patient.  Patient stated she continues to have cough since OV 10/29/21 with Dr. Shearon Stalls.  Patient stated cough is productive with thick white sometimes tan sputum.  Patient states cough is worse at night and she is unable to sleep.  Patient stated she has used OTC cough meds, but none are helping.  Patient states nebs help with congestion, but cough returns. Patient stated she was prescribed Promethazine cough med at urgent care 01/2021 and that helped her cough then.  Patient requested Promethazine prescription to be sent to [place] to help with her cough at night so she can sleep.   Patient also requested results from lung screening CT 11/13/21.  Reviewed CT results and recommendations from Dr. Shearon Stalls. Understanding stated.  Message routed to Dr. Shearon Stalls to advise on promethazine    CT results from Dr. Shearon Stalls sent through my chart 11/19/21 and viewed by patient 11/19/21.   No evidence of lung cancer in your CT Chest - you will next be due for a scan in 12 months time - Dr. Shearon Stalls  Written by Spero Geralds, MD on 11/19/2021  7:51 AM EDT Seen by patient Kimberly Hobbs on 11/19/2021 11:46 AM  Seen by proxy Michelle Nasuti on 11/21/2021 10:32 AM

## 2021-11-21 NOTE — Telephone Encounter (Signed)
Full VM, will attempt to all back later

## 2021-11-21 NOTE — Telephone Encounter (Signed)
pt calling b/c she has been coughing since before her last OV (9/11) .. pt states she went to a UC in the past and was presribed promethazine which helped her the most so she was wondering if Dr. Shearon Stalls could call a RX to the [facility] in [place] . States she has tried multiple OTC cough syrups and nothing has worked.   pt also mentions that she had called multiple times about her ct scan and didn't get called back. Pt did confirm receiving a message via mychart from Dr. Shearon Stalls  about results stating that it wasn't cancer but was expecting a call due to the results stating she possibly had an infection in her lungs. Sates that she did not appreciate the mychart communication vs a callback because she felt less important.  [phone]

## 2021-11-21 NOTE — Telephone Encounter (Signed)
ATC patient with Dr. Mauricio Po recommendations to schedule OV with NP.  Will try again at a later time.

## 2021-11-22 ENCOUNTER — Ambulatory Visit: Payer: Medicare Other | Admitting: Cardiology

## 2021-11-22 DIAGNOSIS — Z5181 Encounter for therapeutic drug level monitoring: Secondary | ICD-10-CM | POA: Diagnosis not present

## 2021-11-22 DIAGNOSIS — Z7901 Long term (current) use of anticoagulants: Secondary | ICD-10-CM | POA: Diagnosis not present

## 2021-11-22 DIAGNOSIS — Z952 Presence of prosthetic heart valve: Secondary | ICD-10-CM | POA: Diagnosis not present

## 2021-11-22 LAB — POCT INR: INR: 3.6 — AB (ref 2.0–3.0)

## 2021-11-22 NOTE — Progress Notes (Signed)
No chief complaint on file.   Description   11/22/2021    INR today was 3.6 (with our machine) 3.3 (with patient machine) Patient goal level is (2.5-3.5)  Prior dose was 5 mg Thursday and Saturday and 7.5 mg all the other days.  patient will now start back on 5 mg Thursday and Saturday, and 7.5 mg all the other days.   Pt will recheck INR 4 weeks on 11/6.  Lab Results      Component                Value               Date                      INR                      3.1 (A)             11/19/2021                INR                      2.4                 11/15/2021                INR                      2.5                 10/31/2021       Status post mechanical aortic valve replacement  (primary encounter diagnosis) Plan: POCT INR  H/O mitral valve replacement with mechanical valve  Monitoring for long-term anticoagulant use

## 2021-11-23 ENCOUNTER — Encounter: Payer: Self-pay | Admitting: Internal Medicine

## 2021-11-23 ENCOUNTER — Ambulatory Visit (INDEPENDENT_AMBULATORY_CARE_PROVIDER_SITE_OTHER): Payer: Medicare Other | Admitting: Internal Medicine

## 2021-11-23 VITALS — BP 118/60 | HR 63 | Temp 98.0°F | Wt 215.8 lb

## 2021-11-23 DIAGNOSIS — K219 Gastro-esophageal reflux disease without esophagitis: Secondary | ICD-10-CM

## 2021-11-23 DIAGNOSIS — R053 Chronic cough: Secondary | ICD-10-CM

## 2021-11-23 DIAGNOSIS — J310 Chronic rhinitis: Secondary | ICD-10-CM | POA: Diagnosis not present

## 2021-11-23 DIAGNOSIS — H905 Unspecified sensorineural hearing loss: Secondary | ICD-10-CM | POA: Diagnosis not present

## 2021-11-23 MED ORDER — AZELASTINE HCL 0.1 % NA SOLN: 1.0000 | Freq: Two times a day (BID) | NASAL | 5 refills | Status: DC

## 2021-11-23 MED ORDER — PROMETHAZINE HCL 6.25 MG/5ML PO SYRP: 6.2500 mg | ORAL_SOLUTION | Freq: Every evening | ORAL | 0 refills | Status: DC | PRN

## 2021-11-23 MED ORDER — PANTOPRAZOLE SODIUM 40 MG PO TBEC: 40.0000 mg | DELAYED_RELEASE_TABLET | Freq: Two times a day (BID) | ORAL | 3 refills | Status: DC

## 2021-11-23 NOTE — Patient Instructions (Addendum)
Please schedule follow up scheduled with APP in 3 months.  If my schedule is not open yet, we will contact you with a reminder closer to that time. Please call [phone] if you haven't heard from [place] a month before.   Your cough is a combination of reflux and chronic sinus drainage Start taking astelin nasal spray 1 spray each side of your nose once a day. Continue saline nasal spray as needed.  Continue cetirizine at night. Increase acid reflux medicine to twice a day.  I will give you some promethazine cough syrup at night for the short term only to help you sleep until this is under better control.  If no improvement recommend referral to gastroenterology for reflux.   What is GERD? Gastroesophageal reflux disease (GERD) is gastroesophageal reflux diseasewhich occurs when the lower esophageal sphincter (LES) opens spontaneously, for varying periods of time, or does not close properly and stomach contents rise up into the esophagus. GER is also called acid reflux or acid regurgitation, because digestive juices--called acids--rise up with the food. The esophagus is the tube that carries food from the mouth to the stomach. The LES is a ring of muscle at the bottom of the esophagus that acts like a valve between the esophagus and stomach.  When acid reflux occurs, food or fluid can be tasted in the back of the mouth. When refluxed stomach acid touches the lining of the esophagus it may cause a burning sensation in the chest or throat called heartburn or acid indigestion. Occasional reflux is common. Persistent reflux that occurs more than twice a week is considered GERD, and it can eventually lead to more serious health problems. People of all ages can have GERD. Studies have shown that GERD may worsen or contribute to asthma, chronic cough, and pulmonary fibrosis.   What are the symptoms of GERD? The main symptom of GERD in adults is frequent heartburn, also called acid  indigestion--burning-type pain in the lower part of the mid-chest, behind the breast bone, and in the mid-abdomen.  Not all reflux is acidic in nature, and many patients don't have heart burn at all. Sometimes it feels like a cough (either dry or with mucus), choking sensation, asthma, shortness of breath, waking up at night, frequent throat clearing, or trouble swallowing.    What causes GERD? The reason some people develop GERD is still unclear. However, research shows that in people with GERD, the LES relaxes while the rest of the esophagus is working. Anatomical abnormalities such as a hiatal hernia may also contribute to GERD. A hiatal hernia occurs when the upper part of the stomach and the LES move above the diaphragm, the muscle wall that separates the stomach from the chest. Normally, the diaphragm helps the LES keep acid from rising up into the esophagus. When a hiatal hernia is present, acid reflux can occur more easily. A hiatal hernia can occur in people of any age and is most often a normal finding in otherwise healthy people over age 66. Most of the time, a hiatal hernia produces no symptoms.   Other factors that may contribute to GERD include - Obesity or recent weight gain - Pregnancy  - Smoking  - Diet - Certain medications  Common foods that can worsen reflux symptoms include: - carbonated beverages - artificial sweeteners - citrus fruits  - chocolate  - drinks with caffeine or alcohol  - fatty and fried foods  - garlic and onions  - mint flavorings  -  spicy foods  - tomato-based foods, like spaghetti sauce, salsa, chili, and pizza   Lifestyle Changes If you smoke, stop.  Avoid foods and beverages that worsen symptoms (see above.) Lose weight if needed.  Eat small, frequent meals.  Wear loose-fitting clothes.  Avoid lying down for 3 hours after a meal.  Raise the head of your bed 6 to 8 inches by securing wood blocks under the bedposts. Just using extra pillows will  not help, but using a wedge-shaped pillow may be helpful.  Medications  H2 blockers, such as cimetidine (Tagamet HB), famotidine (Pepcid AC), nizatidine (Axid AR), and ranitidine (Zantac 75), decrease acid production. They are available in prescription strength and over-the-counter strength. These drugs provide short-term relief and are effective for about half of those who have GERD symptoms.  Proton pump inhibitors include omeprazole (Prilosec, Zegerid), lansoprazole (Prevacid), pantoprazole (Protonix), rabeprazole (Aciphex), and esomeprazole (Nexium), which are available by prescription. Prilosec is also available in over-the-counter strength. Proton pump inhibitors are more effective than H2 blockers and can relieve symptoms and heal the esophageal lining in almost everyone who has GERD.  Because drugs work in different ways, combinations of medications may help control symptoms. People who get heartburn after eating may take both antacids and H2 blockers. The antacids work first to neutralize the acid in the stomach, and then the H2 blockers act on acid production. By the time the antacid stops working, the H2 blocker will have stopped acid production. Your health care provider is the best source of information about how to use medications for GERD.   Points to Remember 1. You can have GERD without having heartburn. Your symptoms could include a dry cough, asthma symptoms, or trouble swallowing.  2. Taking medications daily as prescribed is important in controlling you symptoms.  Sometimes it can take up to 8 weeks to fully achieve the effects of the medications prescribed.  3. Coughing related to GERD can be difficult to treat and is very frustrating!  However, it is important to stick with these medications and lifestyle modifications before pursuing more aggressive or invasive test and treatments.

## 2021-11-23 NOTE — Telephone Encounter (Signed)
Called & spoke w/ pt, says she had an appt with her pulmonologist this morning.

## 2021-11-23 NOTE — Progress Notes (Signed)
Kimberly Hobbs    [id number]    04-08-1960  Primary Care Physician:Nche, Charlene Brooke, NP Date of Appointment: 11/23/2021 Established Patient Visit  Chief complaint:   Chief Complaint  Patient presents with   Acute Visit    Cough had gotten worse over past month     HPI: Kimberly Hobbs is a 61 y.o. woman with previous tobacco use disorder 60 pack years, quit 2018. Gold stage 0 based on PFTs. Additional PMH CAD s/p CABG, mechanical AVR and MVR 2018, A.fib on Coumadin.  Interval Updates: Here for follow up for acute visit. She was having worsening cough. Had LDCT for lung cancer screening that showed no concerning nodules. She had some very unimpressive RLL ground glass, likely inflammatory. I do not think she has pneumonia or bronchitis.   Today says some days cough is worse.  Doesn't think he nebulizer is helping much with cough but did not help with the breathing.  She says her cough is worse at night time and first thing in the morning.  Having worsening reflux at night. She snacks before bed. Does sleep with wedge pillow.   Also having worsening post nasal drainage and sinus headaches. Taking saline spray but flonase caused lots of soreness for her in the past.     I have reviewed the patient's family social and past medical history and updated as appropriate.   Past Medical History:  Diagnosis Date   CKD stage 3 due to type 2 diabetes mellitus ([place]) 05/15/2021   COVID-19    Hyperlipidemia    Hypertension    Rheumatic heart disease    MS/ AS    Past Surgical History:  Procedure Laterality Date   CARDIAC CATHETERIZATION     CHOLECYSTECTOMY     CORONARY/GRAFT ANGIOGRAPHY N/A 08/18/2018   Procedure: CORONARY/GRAFT ANGIOGRAPHY;  Surgeon: Nigel Mormon, MD;  Location: [place] [facility];  Service: Cardiovascular;  Laterality: N/A;   LEG SURGERY     "metal plate in leg"   RIGHT HEART CATH AND CORONARY/GRAFT ANGIOGRAPHY N/A 07/21/2018    Procedure: RIGHT HEART CATH AND CORONARY/GRAFT ANGIOGRAPHY;  Surgeon: Nigel Mormon, MD;  Location: [place] [facility];  Service: Cardiovascular;  Laterality: N/A;   TEE WITHOUT CARDIOVERSION N/A 07/27/2015   Procedure: TRANSESOPHAGEAL ECHOCARDIOGRAM (TEE);  Surgeon: Sanda Klein, MD;  Location: [facility] ENDOSCOPY;  Service: Cardiovascular;  Laterality: N/A;   TEE WITHOUT CARDIOVERSION N/A 07/21/2018   Procedure: TRANSESOPHAGEAL ECHOCARDIOGRAM (TEE);  Surgeon: Nigel Mormon, MD;  Location: [facility] ENDOSCOPY;  Service: Cardiovascular;  Laterality: N/A;   TONSILLECTOMY AND ADENOIDECTOMY      Family History  Problem Relation Age of Onset   Cancer Mother    Hypertension Mother    Diabetes Mother    Hyperlipidemia Mother    Heart disease Mother    Cancer Father    Hypertension Father    Diabetes Father    Heart disease Father    Hypertension Sister    Hypertension Brother    Hypertension Brother    Schizophrenia Maternal Grandmother     Social History   Occupational History   Not on file  Tobacco Use   Smoking status: Former    Packs/day: 1.50    Years: 40.00    Total pack years: 60.00    Types: Cigarettes    Quit date: 12/23/2016    Years since quitting: 4.9   Smokeless tobacco: Never  Vaping Use   Vaping Use:  Never used  Substance and Sexual Activity   Alcohol use: Not Currently   Drug use: No   Sexual activity: Not Currently     Physical Exam: Blood pressure 118/60, pulse 63, temperature 98 F (36.7 C), temperature source Oral, weight 215 lb 12.8 oz (97.9 kg), SpO2 93 %.  Gen:      No acute distress, harsh barking cough with wheezing noted over the upper airway ENT:  +cobblestoning in oropharynx, no nasal polyps, mucus membranes moist Lungs:   ctab, some transmitted upper airway wheezing CV:        RRR no mrg   Data Reviewed: Imaging: I have personally reviewed the CT Chest   PFTs:     Latest Ref Rng & Units 07/07/2019    8:58 AM  PFT Results  FVC-Pre L  2.60   FVC-Predicted Pre % 74   FVC-Post L 2.83   FVC-Predicted Post % 80   Pre FEV1/FVC % % 77   Post FEV1/FCV % % 79   FEV1-Pre L 2.00   FEV1-Predicted Pre % 73   FEV1-Post L 2.22   DLCO uncorrected ml/min/mmHg 17.22   DLCO UNC% % 81   DLCO corrected ml/min/mmHg 17.22   DLCO COR %Predicted % 81   DLVA Predicted % 89   TLC L 5.58   TLC % Predicted % 107   RV % Predicted % 138    I have personally reviewed the patient's PFTs and no airflow limitation but with bd response to BD  Labs:  Immunization status: Immunization History  Administered Date(s) Administered   Fluad Quad(high Dose 65+) 02/01/2021   Influenza-Unspecified 01/14/2019    External Records Personally Reviewed: pulmonary, pcp, cardiology  Assessment:  Chronic cough, combination of gerd and rhinitis COPD, Gold Stage 0 Chronic rhinitis History of tobacco use Need for lung cancer screening - next in sept 2024  Plan/Recommendations: Your cough is a combination of reflux and chronic sinus drainage Start taking astelin nasal spray 1 spray each side of your nose once a day. Continue saline nasal spray as needed.  Continue cetirizine at night. Increase acid reflux medicine to twice a day.  I will give you some promethazine cough syrup at night for the short term only to help you sleep until this is under better control.  If no improvement recommend referral to gastroenterology for reflux.    Return to Care: Return in about 3 months (around 02/23/2022).   Lenice Llamas, MD Pulmonary and [place]

## 2021-11-29 ENCOUNTER — Other Ambulatory Visit: Payer: Self-pay | Admitting: Family Medicine

## 2021-11-29 ENCOUNTER — Other Ambulatory Visit: Payer: Self-pay

## 2021-11-29 ENCOUNTER — Telehealth: Payer: Self-pay | Admitting: Nurse Practitioner

## 2021-11-29 DIAGNOSIS — F411 Generalized anxiety disorder: Secondary | ICD-10-CM

## 2021-11-29 MED ORDER — CLONAZEPAM 0.5 MG PO TABS: 0.7500 mg | ORAL_TABLET | Freq: Every day | ORAL | 0 refills | Status: DC

## 2021-11-29 NOTE — Telephone Encounter (Signed)
Pt is needing her clonazePAM (KLONOPIN) 0.5 MG tablet [[id number]] [facility] are [facility] of Kl[facility]. She is completely out of her pills. Pt's # [phone]

## 2021-11-30 MED ORDER — CLONAZEPAM 0.5 MG PO TABS: 0.7500 mg | ORAL_TABLET | Freq: Every day | ORAL | 0 refills | Status: DC

## 2021-12-03 ENCOUNTER — Telehealth: Payer: Self-pay

## 2021-12-03 DIAGNOSIS — Z952 Presence of prosthetic heart valve: Secondary | ICD-10-CM | POA: Diagnosis not present

## 2021-12-03 DIAGNOSIS — I480 Paroxysmal atrial fibrillation: Secondary | ICD-10-CM | POA: Diagnosis not present

## 2021-12-03 DIAGNOSIS — Z7901 Long term (current) use of anticoagulants: Secondary | ICD-10-CM | POA: Diagnosis not present

## 2021-12-03 NOTE — Telephone Encounter (Signed)
12/03/2021    INR today was 3.2. Patient goal level is (2.5-3.5)  Patient will now start back on 5 mg Thursday and Saturday, and 7.5 mg all the other days  Patient will now start back on 5 mg Thursday and Saturday, and 7.5 mg all the other days  Pt will recheck INR 1 weeks on 10/23.   Status post mechanical aortic valve replacement  (primary encounter diagnosis)  Plan: POCT INR   H/O mitral valve replacement with mechanical valve     Lab Results  Component Value Date   INR 3.6 (A) 11/22/2021   INR 3.1 (A) 11/19/2021   INR 2.4 11/15/2021    Called pt no answer left a detailed message.

## 2021-12-12 ENCOUNTER — Telehealth: Payer: Self-pay | Admitting: Nurse Practitioner

## 2021-12-12 NOTE — Telephone Encounter (Signed)
Caller Name: Ashland Osmer Call back phone #: [phone]  Reason for Call: Pt has been added to nurse sched tomorrow 10/26 for flu shot and RSV which she stated Baldo Ash had talked about during last appt. She wants to make sure it is okay to get these as she is a hear patient with heart medications

## 2021-12-13 ENCOUNTER — Ambulatory Visit (INDEPENDENT_AMBULATORY_CARE_PROVIDER_SITE_OTHER): Payer: Medicare Other

## 2021-12-13 DIAGNOSIS — J310 Chronic rhinitis: Secondary | ICD-10-CM | POA: Diagnosis not present

## 2021-12-13 DIAGNOSIS — Z23 Encounter for immunization: Secondary | ICD-10-CM

## 2021-12-13 DIAGNOSIS — J449 Chronic obstructive pulmonary disease, unspecified: Secondary | ICD-10-CM | POA: Diagnosis not present

## 2021-12-13 NOTE — Progress Notes (Signed)
Per orders of [facility], injection of Influenza given by Armandina Gemma, cma.  Patient tolerated injection well.

## 2021-12-13 NOTE — Telephone Encounter (Signed)
Already completed

## 2021-12-20 ENCOUNTER — Ambulatory Visit (INDEPENDENT_AMBULATORY_CARE_PROVIDER_SITE_OTHER): Payer: Medicare Other | Admitting: Nurse Practitioner

## 2021-12-20 ENCOUNTER — Encounter: Payer: Self-pay | Admitting: Nurse Practitioner

## 2021-12-20 VITALS — BP 124/51 | HR 54 | Wt 216.0 lb

## 2021-12-20 DIAGNOSIS — S161XXA Strain of muscle, fascia and tendon at neck level, initial encounter: Secondary | ICD-10-CM

## 2021-12-20 MED ORDER — CYCLOBENZAPRINE HCL 10 MG PO TABS: 10.0000 mg | ORAL_TABLET | Freq: Three times a day (TID) | ORAL | 0 refills | Status: DC | PRN

## 2021-12-20 NOTE — Patient Instructions (Signed)
It was great to see you!  Start flexeril every 8 hours as needed for neck stiffness or spasms, this may make you sleepy.  Start voltaren gel 4 times a day as needed for pain.   You can keep using heat or alternate with ice along with tylenol as needed.   Let's follow-up if your symptoms worsen or don't improve.   Take care,  Vance Peper, NP

## 2021-12-20 NOTE — Assessment & Plan Note (Signed)
Pain in right side of neck after waking up yesterday morning.  No red flags on exam.  Most likely due to muscle strain.  She takes Coumadin and is unable to take NSAIDs orally.  She can start Voltaren gel topical to the area 4 times a day as needed for pain.  We will also start her on Flexeril 3 times daily as needed muscle tightness/spasms.  Discussed this may make her sleepy.  She can also continue to alternate heat and ice.  Gentle range of motion exercises can be helpful.  Follow-up if symptoms worsen or do not improve.

## 2021-12-20 NOTE — Progress Notes (Signed)
   Acute Office Visit  Subjective:     Patient ID: Kimberly Hobbs, female    DOB: June 05, 1960, 61 y.o.   MRN: [id number]  Chief Complaint  Patient presents with   Acute Visit    Pt c/o pain, stiffness in RT side of neck x2 days w/ limited mobility. Pain level (8)    HPI Patient is in today for pain and stiffness in the right side of her neck when she woke up yesterday morning.  She rates the pain as an 8/10.  It stays in the right side of her neck and does not radiate.  She states that any movement makes the pain worse.  She has tried Tylenol which has not helped.  She has used heat which has helped a little bit.  She is unable to move her head without the pain worsening.  She denies injury, fevers, sore throat.  ROS See pertinent positives and negatives per HPI.     Objective:    BP (!) 124/51   Pulse (!) 54   Wt 216 lb (98 kg)   SpO2 96%   BMI 35.94 kg/m    Physical Exam Vitals and nursing note reviewed.  Constitutional:      General: She is not in acute distress.    Appearance: Normal appearance.  HENT:     Head: Normocephalic.  Eyes:     Conjunctiva/sclera: Conjunctivae normal.  Pulmonary:     Effort: Pulmonary effort is normal.  Musculoskeletal:        General: Tenderness (right side of neck) present. No swelling or signs of injury.     Cervical back: Normal range of motion.     Comments: Cervical ROM limited due to pain  Skin:    General: Skin is warm.  Neurological:     General: No focal deficit present.     Mental Status: She is alert and oriented to person, place, and time.  Psychiatric:        Mood and Affect: Mood normal.        Behavior: Behavior normal.        Thought Content: Thought content normal.        Judgment: Judgment normal.      Assessment & Plan:   Problem List Items Addressed This Visit       Musculoskeletal and Integument   Cervical strain - Primary    Pain in right side of neck after waking up yesterday morning.  No red flags  on exam.  Most likely due to muscle strain.  She takes Coumadin and is unable to take NSAIDs orally.  She can start Voltaren gel topical to the area 4 times a day as needed for pain.  We will also start her on Flexeril 3 times daily as needed muscle tightness/spasms.  Discussed this may make her sleepy.  She can also continue to alternate heat and ice.  Gentle range of motion exercises can be helpful.  Follow-up if symptoms worsen or do not improve.       Meds ordered this encounter  Medications   cyclobenzaprine (FLEXERIL) 10 MG tablet    Sig: Take 1 tablet (10 mg total) by mouth 3 (three) times daily as needed for muscle spasms.    Dispense:  30 tablet    Refill:  0    Return if symptoms worsen or fail to improve.  Charyl Dancer, NP

## 2021-12-21 ENCOUNTER — Ambulatory Visit: Payer: Medicare Other

## 2021-12-24 ENCOUNTER — Ambulatory Visit (INDEPENDENT_AMBULATORY_CARE_PROVIDER_SITE_OTHER): Payer: Medicare Other | Admitting: Nurse Practitioner

## 2021-12-24 ENCOUNTER — Encounter: Payer: Self-pay | Admitting: Nurse Practitioner

## 2021-12-24 VITALS — BP 138/76 | HR 58 | Temp 96.7°F | Ht 65.0 in | Wt 220.2 lb

## 2021-12-24 DIAGNOSIS — S161XXD Strain of muscle, fascia and tendon at neck level, subsequent encounter: Secondary | ICD-10-CM | POA: Diagnosis not present

## 2021-12-24 DIAGNOSIS — E1122 Type 2 diabetes mellitus with diabetic chronic kidney disease: Secondary | ICD-10-CM

## 2021-12-24 DIAGNOSIS — F411 Generalized anxiety disorder: Secondary | ICD-10-CM

## 2021-12-24 DIAGNOSIS — E1165 Type 2 diabetes mellitus with hyperglycemia: Secondary | ICD-10-CM

## 2021-12-24 DIAGNOSIS — N183 Chronic kidney disease, stage 3 unspecified: Secondary | ICD-10-CM | POA: Diagnosis not present

## 2021-12-24 LAB — RENAL FUNCTION PANEL
Albumin: 4.6 g/dL (ref 3.5–5.2)
BUN: 22 mg/dL (ref 6–23)
CO2: 24 mEq/L (ref 19–32)
Calcium: 9.3 mg/dL (ref 8.4–10.5)
Chloride: 104 mEq/L (ref 96–112)
Creatinine, Ser: 1.26 mg/dL — ABNORMAL HIGH (ref 0.40–1.20)
GFR: 46.46 mL/min — ABNORMAL LOW (ref >60.00)
Glucose, Bld: 96 mg/dL (ref 70–99)
Phosphorus: 4 mg/dL (ref 2.3–4.6)
Potassium: 5.1 mEq/L (ref 3.5–5.1)
Sodium: 137 mEq/L (ref 135–145)

## 2021-12-24 LAB — HEMOGLOBIN A1C: Hgb A1c MFr Bld: 6.3 % (ref 4.6–6.5)

## 2021-12-24 MED ORDER — PREDNISONE 20 MG PO TABS: ORAL_TABLET | ORAL | 0 refills | Status: AC

## 2021-12-24 MED ORDER — CLONAZEPAM 0.5 MG PO TABS: 0.7500 mg | ORAL_TABLET | Freq: Every day | ORAL | 2 refills | Status: DC

## 2021-12-24 MED ORDER — METHOCARBAMOL 500 MG PO TABS: 500.0000 mg | ORAL_TABLET | Freq: Three times a day (TID) | ORAL | 0 refills | Status: DC | PRN

## 2021-12-24 NOTE — Assessment & Plan Note (Signed)
Fasting glucose: 90s Compliant with metformin BP (losartan and metoprolol) and LDL (crestor) at goal Up to date with eye and foot exam. Negative urine microalbumin  Repeat hgbA1c and renal function Maintain med dose F/up in 25month

## 2021-12-24 NOTE — Progress Notes (Signed)
Established Patient Visit  Patient: Kimberly Hobbs   DOB: 12/16/1960   61 y.o. Female  MRN: [id number] Visit Date: 12/24/2021  Subjective:    Chief Complaint  Patient presents with   Office Visit    Hyperlipidemia/ HTN/ Depression  Doesn't check BP often  Pt fasting  C/o neck pain still , seen lauren on 11/23   HPI Persistent neck pain and stiffness x 1week, no improvement with flexeril and tylenol '1000mg'$ . No radiculopathy, no focal muscle weakness, no paresthesia. Current pillow used is 60yr old.  CKD stage 3 due to type 2 diabetes mellitus (HCC) Repeat renal function today BP and Glucose at goal No LE edema. Stable normocytic anemia  Type 2 diabetes mellitus with hyperglycemia, without long-term current use of insulin (HCC) Fasting glucose: 90s Compliant with metformin BP (losartan and metoprolol) and LDL (crestor) at goal Up to date with eye and foot exam. Negative urine microalbumin  Repeat hgbA1c and renal function Maintain med dose F/up in 630month BP Readings from Last 3 Encounters:  12/24/21 138/76  12/20/21 (!) 124/51  11/23/21 118/60    Wt Readings from Last 3 Encounters:  12/24/21 220 lb 3.2 oz (99.9 kg)  12/20/21 216 lb (98 kg)  11/23/21 215 lb 12.8 oz (97.9 kg)    Reviewed medical, surgical, and social history today  Medications: Outpatient Medications Prior to Visit  Medication Sig   acetaminophen (TYLENOL) 500 MG tablet Take 500 mg by mouth every 6 (six) hours as needed (for pain.).   amiodarone (PACERONE) 200 MG tablet Take 1 tablet (200 mg total) by mouth daily.   aspirin EC 81 MG tablet Take 81 mg by mouth daily.    azelastine (ASTELIN) 0.1 % nasal spray Place 1 spray into both nostrils 2 (two) times daily. Use in each nostril as directed   cetirizine (ZYRTEC) 10 MG tablet Take 1 tablet (10 mg total) by mouth daily.   citalopram (CELEXA) 40 MG tablet Take 0.5 tablets (20 mg total) by mouth 2 (two) times daily.   diltiazem  (CARDIZEM CD) 180 MG 24 hr capsule TAKE 1 CAPSULE BY MOUTH DAILY   furosemide (LASIX) 20 MG tablet TAKE 1 TABLET BY MOUTH DAILY   isosorbide mononitrate (IMDUR) 60 MG 24 hr tablet TAKE 1 TABLET(60 MG) BY MOUTH DAILY   Lancets (ONETOUCH DELICA PLUS [id number] TAKE AS DIRECTED ONCE DAILY.   levalbuterol (XOPENEX HFA) 45 MCG/ACT inhaler Inhale 1-2 puffs into the lungs every 6 (six) hours as needed for shortness of breath.   levalbuterol (XOPENEX) 0.63 MG/3ML nebulizer solution Take 3 mLs (0.63 mg total) by nebulization every 4 (four) hours as needed for wheezing or shortness of breath.   losartan (COZAAR) 50 MG tablet Take 1 tablet (50 mg total) by mouth daily.   metFORMIN (GLUCOPHAGE) 500 MG tablet Take 1 tablet (500 mg total) by mouth 2 (two) times daily with a meal.   metoprolol succinate (TOPROL-XL) 25 MG 24 hr tablet Take 1 tablet (25 mg total) by mouth daily. Take with or immediately following a meal.   nitroGLYCERIN (NITROSTAT) 0.4 MG SL tablet Place 1 tablet (0.4 mg total) under the tongue every 5 (five) minutes x 3 doses as needed for chest pain.   ONETOUCH VERIO test strip AS DIRECTED ONCE A DAY   pantoprazole (PROTONIX) 40 MG tablet Take 1 tablet (40 mg total) by mouth 2 (two) times daily before a meal.  rosuvastatin (CRESTOR) 40 MG tablet Take 1 tablet (40 mg total) by mouth daily.   spironolactone (ALDACTONE) 50 MG tablet TAKE 1 TABLET(50 MG) BY MOUTH DAILY   Tiotropium Bromide-Olodaterol (STIOLTO RESPIMAT) 2.5-2.5 MCG/ACT AERS Inhale 2 puffs into the lungs daily.   warfarin (COUMADIN) 5 MG tablet TAKE 1 TABLET BY MOUTH DAILY   warfarin (COUMADIN) 7.5 MG tablet TAKE 1 TABLET BY MOUTH EVERY EVENING (Patient taking differently: TAKE 1 TABLET BY MOUTH EVERY EVENING Or as directed by coumadin clinic)   [DISCONTINUED] clonazePAM (KLONOPIN) 0.5 MG tablet Take 1.5 tablets (0.75 mg total) by mouth at bedtime.   [DISCONTINUED] cyclobenzaprine (FLEXERIL) 10 MG tablet Take 1 tablet (10 mg  total) by mouth 3 (three) times daily as needed for muscle spasms.   [DISCONTINUED] promethazine (PHENERGAN) 6.25 MG/5ML syrup Take 5 mLs (6.25 mg total) by mouth at bedtime as needed (cough). (Patient not taking: Reported on 12/24/2021)   No facility-administered medications prior to visit.   Reviewed past medical and social history.   ROS per HPI above      Objective:  BP 138/76   Pulse (!) 58   Temp (!) 96.7 F (35.9 C) (Temporal)   Ht '5\' 5"'$  (1.651 m)   Wt 220 lb 3.2 oz (99.9 kg)   SpO2 98%   BMI 36.64 kg/m      Physical Exam Vitals reviewed.  Neck:     Thyroid: No thyroid mass, thyromegaly or thyroid tenderness.     Comments: Right posterior cervical muscle tenderness Cardiovascular:     Rate and Rhythm: Normal rate and regular rhythm.     Pulses: Normal pulses.     Heart sounds: Murmur heard.  Pulmonary:     Effort: Pulmonary effort is normal.     Breath sounds: Normal breath sounds.  Musculoskeletal:     Cervical back: Tenderness present. No crepitus. Pain with movement and muscular tenderness present. No spinous process tenderness. Normal range of motion.  Lymphadenopathy:     Cervical: No cervical adenopathy.  Neurological:     Mental Status: She is alert and oriented to person, place, and time.     No results found for any visits on 12/24/21.    Assessment & Plan:    Problem List Items Addressed This Visit       Endocrine   CKD stage 3 due to type 2 diabetes mellitus ([place])    Repeat renal function today BP and Glucose at goal No LE edema. Stable normocytic anemia      Relevant Orders   Renal Function Panel   Type 2 diabetes mellitus with hyperglycemia, without long-term current use of insulin (HCC) - Primary    Fasting glucose: 90s Compliant with metformin BP (losartan and metoprolol) and LDL (crestor) at goal Up to date with eye and foot exam. Negative urine microalbumin  Repeat hgbA1c and renal function Maintain med dose F/up in  86month       Relevant Orders   Hemoglobin A1c     Musculoskeletal and Integument   Cervical strain   Relevant Medications   predniSONE (DELTASONE) 20 MG tablet   methocarbamol (ROBAXIN) 500 MG tablet     Other   GAD (generalized anxiety disorder)   Relevant Medications   clonazePAM (KLONOPIN) 0.5 MG tablet (Start on 12/31/2021)  Change pillow Stop flexeril Start robaxin and prednisone Call office if glucose at 200 or greater. Start neck exercise Apply warm compress before exercise and cold compress after Call office if not improvement in  2weeks  Return in about 3 months (around 03/26/2022) for DM, HTN, hyperlipidemia (fasting).     Wilfred Lacy, NP

## 2021-12-24 NOTE — Assessment & Plan Note (Signed)
Repeat renal function today BP and Glucose at goal No LE edema. Stable normocytic anemia

## 2021-12-24 NOTE — Patient Instructions (Addendum)
Go to lab for blood draw Change pillow Stop flexeril Start robaxin and prednisone Call office if glucose at 200 or greater. Start neck exercise Apply warm compress before exercise and cold compress after Call office if not improvement in 2weeks  Neck Exercises Ask your health care provider which exercises are safe for you. Do exercises exactly as told by your health care provider and adjust them as directed. It is normal to feel mild stretching, pulling, tightness, or discomfort as you do these exercises. Stop right away if you feel sudden pain or your pain gets worse. Do not begin these exercises until told by your health care provider. Neck exercises can be important for many reasons. They can improve strength and maintain flexibility in your neck, which will help your upper back and prevent neck pain. Stretching exercises Rotation neck stretching  Sit in a chair or stand up. Place your feet flat on the floor, shoulder-width apart. Slowly turn your head (rotate) to the right until a slight stretch is felt. Turn it all the way to the right so you can look over your right shoulder. Do not tilt or tip your head. Hold this position for 10-30 seconds. Slowly turn your head (rotate) to the left until a slight stretch is felt. Turn it all the way to the left so you can look over your left shoulder. Do not tilt or tip your head. Hold this position for 10-30 seconds. Repeat __________ times. Complete this exercise __________ times a day. Neck retraction  Sit in a sturdy chair or stand up. Look straight ahead. Do not bend your neck. Use your fingers to push your chin backward (retraction). Do not bend your neck for this movement. Continue to face straight ahead. If you are doing the exercise properly, you will feel a slight sensation in your throat and a stretch at the back of your neck. Hold the stretch for 1-2 seconds. Repeat __________ times. Complete this exercise __________ times a  day. Strengthening exercises Neck press  Lie on your back on a firm bed or on the floor with a pillow under your head. Use your neck muscles to push your head down on the pillow and straighten your spine. Hold the position as well as you can. Keep your head facing up (in a neutral position) and your chin tucked. Slowly count to 5 while holding this position. Repeat __________ times. Complete this exercise __________ times a day. Isometrics These are exercises in which you strengthen the muscles in your neck while keeping your neck still (isometrics). Sit in a supportive chair and place your hand on your forehead. Keep your head and face facing straight ahead. Do not flex or extend your neck while doing isometrics. Push forward with your head and neck while pushing back with your hand. Hold for 10 seconds. Do the sequence again, this time putting your hand against the back of your head. Use your head and neck to push backward against the hand pressure. Finally, do the same exercise on either side of your head, pushing sideways against the pressure of your hand. Repeat __________ times. Complete this exercise __________ times a day. Prone head lifts  Lie face-down (prone position), resting on your elbows so that your chest and upper back are raised. Start with your head facing downward, near your chest. Position your chin either on or near your chest. Slowly lift your head upward. Lift until you are looking straight ahead. Then continue lifting your head as far back as  you can comfortably stretch. Hold your head up for 5 seconds. Then slowly lower it to your starting position. Repeat __________ times. Complete this exercise __________ times a day. Supine head lifts  Lie on your back (supine position), bending your knees to point to the ceiling and keeping your feet flat on the floor. Lift your head slowly off the floor, raising your chin toward your chest. Hold for 5 seconds. Repeat  __________ times. Complete this exercise __________ times a day. Scapular retraction  Stand with your arms at your sides. Look straight ahead. Slowly pull both shoulders (scapulae) backward and downward (retraction) until you feel a stretch between your shoulder blades in your upper back. Hold for 10-30 seconds. Relax and repeat. Repeat __________ times. Complete this exercise __________ times a day. Contact a health care provider if: Your neck pain or discomfort gets worse when you do an exercise. Your neck pain or discomfort does not improve within 2 hours after you exercise. If you have any of these problems, stop exercising right away. Do not do the exercises again unless your health care provider says that you can. Get help right away if: You develop sudden, severe neck pain. If this happens, stop exercising right away. Do not do the exercises again unless your health care provider says that you can. This information is not intended to replace advice given to you by your health care provider. Make sure you discuss any questions you have with your health care provider. Document Revised: 08/01/2020 Document Reviewed: 08/01/2020 Elsevier Patient Education  [place].

## 2021-12-25 ENCOUNTER — Telehealth: Payer: Self-pay | Admitting: Nurse Practitioner

## 2021-12-25 ENCOUNTER — Encounter: Payer: Self-pay | Admitting: Nurse Practitioner

## 2021-12-25 DIAGNOSIS — M542 Cervicalgia: Secondary | ICD-10-CM

## 2021-12-25 DIAGNOSIS — S161XXD Strain of muscle, fascia and tendon at neck level, subsequent encounter: Secondary | ICD-10-CM

## 2021-12-25 MED ORDER — TRAMADOL HCL 50 MG PO TABS: 50.0000 mg | ORAL_TABLET | Freq: Two times a day (BID) | ORAL | 0 refills | Status: DC | PRN

## 2021-12-25 NOTE — Telephone Encounter (Signed)
Pt said you prescibed her pain medicaion yesterday and it's not working can you prescribe her something else . Please call the pt

## 2021-12-25 NOTE — Telephone Encounter (Signed)
[phone] please call mrs Heberle on this phone #

## 2021-12-26 NOTE — Telephone Encounter (Signed)
Unable to reach pt, lvm for pt to call back. To schedule X-ray

## 2021-12-31 DIAGNOSIS — Z952 Presence of prosthetic heart valve: Secondary | ICD-10-CM | POA: Diagnosis not present

## 2021-12-31 DIAGNOSIS — I480 Paroxysmal atrial fibrillation: Secondary | ICD-10-CM | POA: Diagnosis not present

## 2021-12-31 DIAGNOSIS — Z7901 Long term (current) use of anticoagulants: Secondary | ICD-10-CM | POA: Diagnosis not present

## 2022-01-01 ENCOUNTER — Telehealth: Payer: Self-pay

## 2022-01-01 ENCOUNTER — Other Ambulatory Visit: Payer: Self-pay

## 2022-01-01 MED ORDER — AMIODARONE HCL 200 MG PO TABS: 200.0000 mg | ORAL_TABLET | Freq: Every day | ORAL | 3 refills | Status: DC

## 2022-01-01 NOTE — Telephone Encounter (Signed)
Do recommend 200 mg daily. Please send 90 pillxX3 refills.  Thanks MJP

## 2022-01-01 NOTE — Telephone Encounter (Signed)
Patient is calling for a refill on her Amiodarone. Her pharmacy is telling her that she is picking it up too early and should only be taking a half of a pill (100 mg) a day. In our system it says a whole pill (200 mg).

## 2022-01-01 NOTE — Telephone Encounter (Signed)
Okay, sent in the refill. Will call the patient and let her know about dosage too.  Thank you

## 2022-01-11 ENCOUNTER — Other Ambulatory Visit: Payer: Self-pay | Admitting: Cardiology

## 2022-01-11 DIAGNOSIS — I25119 Atherosclerotic heart disease of native coronary artery with unspecified angina pectoris: Secondary | ICD-10-CM

## 2022-01-13 DIAGNOSIS — J449 Chronic obstructive pulmonary disease, unspecified: Secondary | ICD-10-CM | POA: Diagnosis not present

## 2022-01-13 DIAGNOSIS — J310 Chronic rhinitis: Secondary | ICD-10-CM | POA: Diagnosis not present

## 2022-01-28 DIAGNOSIS — Z7901 Long term (current) use of anticoagulants: Secondary | ICD-10-CM | POA: Diagnosis not present

## 2022-01-28 DIAGNOSIS — I480 Paroxysmal atrial fibrillation: Secondary | ICD-10-CM | POA: Diagnosis not present

## 2022-01-28 DIAGNOSIS — Z952 Presence of prosthetic heart valve: Secondary | ICD-10-CM | POA: Diagnosis not present

## 2022-01-30 ENCOUNTER — Encounter: Payer: Self-pay | Admitting: Nurse Practitioner

## 2022-02-03 ENCOUNTER — Other Ambulatory Visit: Payer: Self-pay | Admitting: Cardiology

## 2022-02-06 ENCOUNTER — Telehealth: Payer: Self-pay | Admitting: Nurse Practitioner

## 2022-02-06 NOTE — Telephone Encounter (Signed)
Pt called and stated that her back is hurting and can you call her a medication in unto she see you  in february

## 2022-02-06 NOTE — Telephone Encounter (Signed)
Appt scheduled w/ Dr. Ethelene Hal for 02/08/22.

## 2022-02-08 ENCOUNTER — Ambulatory Visit (INDEPENDENT_AMBULATORY_CARE_PROVIDER_SITE_OTHER): Payer: Medicare Other | Admitting: Family Medicine

## 2022-02-08 ENCOUNTER — Encounter: Payer: Self-pay | Admitting: Family Medicine

## 2022-02-08 VITALS — BP 116/68 | HR 58 | Temp 97.4°F | Ht 65.0 in | Wt 223.4 lb

## 2022-02-08 DIAGNOSIS — M545 Low back pain, unspecified: Secondary | ICD-10-CM | POA: Diagnosis not present

## 2022-02-08 MED ORDER — PREDNISONE 10 MG (21) PO TBPK: ORAL_TABLET | ORAL | 0 refills | Status: DC

## 2022-02-08 MED ORDER — METHOCARBAMOL 500 MG PO TABS: 500.0000 mg | ORAL_TABLET | Freq: Three times a day (TID) | ORAL | 0 refills | Status: DC | PRN

## 2022-02-08 NOTE — Progress Notes (Signed)
Established Patient Office Visit   Subjective:  Patient ID: Kimberly Hobbs, female    DOB: 03-Dec-1960  Age: 61 y.o. MRN: [id number]  Chief Complaint  Patient presents with   Back Pain    Back pains for few years becoming worse to 2-3 months very painful when walking.     HPI Encounter Diagnoses  Name Primary?   Bilateral low back pain without sciatica, unspecified chronicity Yes   Reports an ongoing history of lower back pain.  She describes it as occurring across her lower back near her pelvis.  Pain is nonradiating.  She denies any numbness tingling or weakness in her lower extremities.  There have been no changes in her bowel or bladder function.  There is pain when she walks.  It is relieved by when she sits.  She denies pain in her groin or buttock area.  There is been no injury.  She does feel the muscles.  Review of the chart shows x-rays ordered on I explained to them that an epidural [facility] go sciatica you know I did not I thought it was just her back that she was here for low back pain follow-up okay well then on the ninth but I am sorry I am glad you said something   Review of Systems  Constitutional: Negative.   HENT: Negative.    Eyes:  Negative for blurred vision, discharge and redness.  Respiratory: Negative.    Cardiovascular: Negative.   Gastrointestinal:  Negative for abdominal pain.  Genitourinary: Negative.   Musculoskeletal:  Positive for back pain and myalgias.  Skin:  Negative for rash.  Neurological:  Negative for tingling, loss of consciousness and weakness.  Endo/Heme/Allergies:  Negative for polydipsia.     Current Outpatient Medications:    acetaminophen (TYLENOL) 500 MG tablet, Take 500 mg by mouth every 6 (six) hours as needed (for pain.)., Disp: , Rfl:    amiodarone (PACERONE) 200 MG tablet, Take 1 tablet (200 mg total) by mouth daily., Disp: 90 tablet, Rfl: 3   aspirin EC 81 MG tablet, Take 81 mg by mouth daily. , Disp: , Rfl:     azelastine (ASTELIN) 0.1 % nasal spray, Place 1 spray into both nostrils 2 (two) times daily. Use in each nostril as directed, Disp: 30 mL, Rfl: 5   cetirizine (ZYRTEC) 10 MG tablet, Take 1 tablet (10 mg total) by mouth daily., Disp: 30 tablet, Rfl: 11   citalopram (CELEXA) 40 MG tablet, Take 0.5 tablets (20 mg total) by mouth 2 (two) times daily., Disp: 90 tablet, Rfl: 3   clonazePAM (KLONOPIN) 0.5 MG tablet, Take 1.5 tablets (0.75 mg total) by mouth at bedtime., Disp: 45 tablet, Rfl: 2   diltiazem (CARDIZEM CD) 180 MG 24 hr capsule, TAKE 1 CAPSULE BY MOUTH DAILY, Disp: 90 capsule, Rfl: 2   furosemide (LASIX) 20 MG tablet, TAKE 1 TABLET BY MOUTH DAILY, Disp: 90 tablet, Rfl: 2   isosorbide mononitrate (IMDUR) 60 MG 24 hr tablet, TAKE 1 TABLET(60 MG) BY MOUTH DAILY, Disp: 90 tablet, Rfl: 3   Lancets (ONETOUCH DELICA PLUS [id number]) MISC, TAKE AS DIRECTED ONCE DAILY., Disp: , Rfl:    levalbuterol (XOPENEX HFA) 45 MCG/ACT inhaler, Inhale 1-2 puffs into the lungs every 6 (six) hours as needed for shortness of breath., Disp: 1 each, Rfl: 12   levalbuterol (XOPENEX) 0.63 MG/3ML nebulizer solution, Take 3 mLs (0.63 mg total) by nebulization every 4 (four) hours as needed for wheezing or shortness of breath., Disp:  3 mL, Rfl: 12   losartan (COZAAR) 50 MG tablet, Take 1 tablet (50 mg total) by mouth daily., Disp: 90 tablet, Rfl: 3   metFORMIN (GLUCOPHAGE) 500 MG tablet, Take 1 tablet (500 mg total) by mouth 2 (two) times daily with a meal., Disp: 180 tablet, Rfl: 3   methocarbamol (ROBAXIN) 500 MG tablet, Take 1 tablet (500 mg total) by mouth every 8 (eight) hours as needed for muscle spasms., Disp: 21 tablet, Rfl: 0   methocarbamol (ROBAXIN) 500 MG tablet, Take 1 tablet (500 mg total) by mouth every 8 (eight) hours as needed for muscle spasms., Disp: 40 tablet, Rfl: 0   metoprolol succinate (TOPROL-XL) 25 MG 24 hr tablet, Take 1 tablet (25 mg total) by mouth daily. Take with or immediately following a meal.,  Disp: 90 tablet, Rfl: 3   nitroGLYCERIN (NITROSTAT) 0.4 MG SL tablet, Place 1 tablet (0.4 mg total) under the tongue every 5 (five) minutes x 3 doses as needed for chest pain., Disp: 25 tablet, Rfl: 3   ONETOUCH VERIO test strip, AS DIRECTED ONCE A DAY, Disp: , Rfl:    pantoprazole (PROTONIX) 40 MG tablet, Take 1 tablet (40 mg total) by mouth 2 (two) times daily before a meal., Disp: 180 tablet, Rfl: 3   predniSONE (STERAPRED UNI-PAK 21 TAB) 10 MG (21) TBPK tablet, Take 6 today, 5 tomorrow, 4 the next day and then 3, 2, 1 and stop, Disp: 21 tablet, Rfl: 0   rosuvastatin (CRESTOR) 40 MG tablet, Take 1 tablet (40 mg total) by mouth daily., Disp: 90 tablet, Rfl: 1   spironolactone (ALDACTONE) 50 MG tablet, TAKE 1 TABLET(50 MG) BY MOUTH DAILY, Disp: 30 tablet, Rfl: 6   Tiotropium Bromide-Olodaterol (STIOLTO RESPIMAT) 2.5-2.5 MCG/ACT AERS, Inhale 2 puffs into the lungs daily., Disp: 4 g, Rfl: 0   warfarin (COUMADIN) 5 MG tablet, TAKE 1 TABLET BY MOUTH DAILY, Disp: 90 tablet, Rfl: 2   warfarin (COUMADIN) 7.5 MG tablet, TAKE 1 TABLET BY MOUTH EVERY EVENING (Patient taking differently: TAKE 1 TABLET BY MOUTH EVERY EVENING Or as directed by coumadin clinic), Disp: 90 tablet, Rfl: 2   Objective:     BP 116/68 (BP Location: Right Arm, Patient Position: Sitting, Cuff Size: Normal)   Pulse (!) 58   Temp (!) 97.4 F (36.3 C) (Temporal)   Ht '5\' 5"'$  (1.651 m)   Wt 223 lb 6.4 oz (101.3 kg)   SpO2 98%   BMI 37.18 kg/m    Physical Exam   No results found for any visits on 02/08/22.    The ASCVD Risk score (Arnett DK, et al., 2019) failed to calculate for the following reasons:   The valid total cholesterol range is 130 to 320 mg/dL   Unable to determine if patient is Non-Hispanic African American    Assessment & Plan:   Bilateral low back pain without sciatica, unspecified chronicity -     predniSONE; Take 6 today, 5 tomorrow, 4 the next day and then 3, 2, 1 and stop  Dispense: 21 tablet; Refill:  0 -     Methocarbamol; Take 1 tablet (500 mg total) by mouth every 8 (eight) hours as needed for muscle spasms.  Dispense: 40 tablet; Refill: 0 -     DG Lumbar Spine Complete; Future    Return schedule follow up with [facility]..  Send for dedicated lumbar spinal films.  Exercises were given.  Brief course of prednisone with Robaxin as needed.  Libby Maw, MD

## 2022-02-12 DIAGNOSIS — J310 Chronic rhinitis: Secondary | ICD-10-CM | POA: Diagnosis not present

## 2022-02-12 DIAGNOSIS — J449 Chronic obstructive pulmonary disease, unspecified: Secondary | ICD-10-CM | POA: Diagnosis not present

## 2022-02-14 ENCOUNTER — Ambulatory Visit: Payer: Medicare Other | Admitting: Cardiology

## 2022-02-23 DIAGNOSIS — R3129 Other microscopic hematuria: Secondary | ICD-10-CM | POA: Diagnosis not present

## 2022-02-23 DIAGNOSIS — R1010 Upper abdominal pain, unspecified: Secondary | ICD-10-CM | POA: Diagnosis not present

## 2022-02-23 DIAGNOSIS — N200 Calculus of kidney: Secondary | ICD-10-CM | POA: Diagnosis not present

## 2022-02-25 ENCOUNTER — Other Ambulatory Visit: Payer: Self-pay | Admitting: Cardiology

## 2022-02-25 ENCOUNTER — Ambulatory Visit: Payer: Medicare Other | Admitting: Nurse Practitioner

## 2022-02-25 DIAGNOSIS — I480 Paroxysmal atrial fibrillation: Secondary | ICD-10-CM | POA: Diagnosis not present

## 2022-02-25 DIAGNOSIS — Z952 Presence of prosthetic heart valve: Secondary | ICD-10-CM

## 2022-02-25 DIAGNOSIS — Z7901 Long term (current) use of anticoagulants: Secondary | ICD-10-CM | POA: Diagnosis not present

## 2022-02-26 ENCOUNTER — Ambulatory Visit (INDEPENDENT_AMBULATORY_CARE_PROVIDER_SITE_OTHER): Payer: 59 | Admitting: Nurse Practitioner

## 2022-02-26 ENCOUNTER — Encounter: Payer: Self-pay | Admitting: Nurse Practitioner

## 2022-02-26 VITALS — BP 132/58 | HR 58 | Ht 65.0 in | Wt 224.8 lb

## 2022-02-26 DIAGNOSIS — M545 Low back pain, unspecified: Secondary | ICD-10-CM

## 2022-02-26 DIAGNOSIS — G8929 Other chronic pain: Secondary | ICD-10-CM

## 2022-02-26 DIAGNOSIS — R3121 Asymptomatic microscopic hematuria: Secondary | ICD-10-CM

## 2022-02-26 LAB — URINALYSIS, MICROSCOPIC ONLY

## 2022-02-26 LAB — POCT URINALYSIS DIPSTICK
Bilirubin, UA: NEGATIVE
Glucose, UA: NEGATIVE
Ketones, UA: NEGATIVE
Leukocytes, UA: NEGATIVE
Nitrite, UA: NEGATIVE
Protein, UA: POSITIVE — AB
Spec Grav, UA: 1.02 (ref 1.010–1.025)
Urobilinogen, UA: 0.2 E.U./dL
pH, UA: 6 (ref 5.0–8.0)

## 2022-02-26 NOTE — Progress Notes (Signed)
Established Patient Visit  Patient: Kimberly Hobbs   DOB: [birth year]   62 y.o. Female  MRN: [id number] Visit Date: 02/26/2022  Subjective:   No chief complaint on file.  HPI Chronic right-sided low back pain without sciatica Acute on chronic back pain, worse in last 1week, pain is worse with prolonged standing or bending or twisting, no changein GI/GU function, no nausea/vomiting, no rash, no leg weakness or paresthesia. No radiculopathy. No hematuria No relief with tramadol and tylenol. she was seen by my colleague 2weeks ago and by provider at [facility] 3days ago. She did not take robaxin and oral prednisone prescribed 2weeks ago. Did not go for x-ray as recommended. She started hydrocodone-acetaminophen (5-'325mg'$ , #20) prescribed 3days ago. reports significant relief. UA completed indicates positive blood in urine. Previous UA completed 2020: positive hematuria CT ABD/pelvis completed 2020 : no acute finding.  Repeat UA and microscope today Advised to go for X-ray lumbar spine as previously ordered Alternate between warm and cold compress Start oral prednisone and robaxin as prescribed. Take tylenol '1000mg'$  and robaxin every 8hrs. Take hydrocodone for severe pain only Limit acetaminophen dose to no more than '4000mg'$  daily. F/up in 2weeks  Reviewed medical, surgical, and social history today  Medications: Outpatient Medications Prior to Visit  Medication Sig   acetaminophen (TYLENOL) 500 MG tablet Take 500 mg by mouth every 6 (six) hours as needed (for pain.).   amiodarone (PACERONE) 200 MG tablet Take 1 tablet (200 mg total) by mouth daily.   aspirin EC 81 MG tablet Take 81 mg by mouth daily.    azelastine (ASTELIN) 0.1 % nasal spray Place 1 spray into both nostrils 2 (two) times daily. Use in each nostril as directed   cetirizine (ZYRTEC) 10 MG tablet Take 1 tablet (10 mg total) by mouth daily.   citalopram (CELEXA) 40 MG tablet Take 0.5  tablets (20 mg total) by mouth 2 (two) times daily.   clonazePAM (KLONOPIN) 0.5 MG tablet Take 1.5 tablets (0.75 mg total) by mouth at bedtime.   diltiazem (CARDIZEM CD) 180 MG 24 hr capsule TAKE 1 CAPSULE BY MOUTH DAILY   furosemide (LASIX) 20 MG tablet TAKE 1 TABLET BY MOUTH DAILY   isosorbide mononitrate (IMDUR) 60 MG 24 hr tablet TAKE 1 TABLET(60 MG) BY MOUTH DAILY   Lancets (ONETOUCH DELICA PLUS [id number]) MISC TAKE AS DIRECTED ONCE DAILY.   levalbuterol (XOPENEX HFA) 45 MCG/ACT inhaler Inhale 1-2 puffs into the lungs every 6 (six) hours as needed for shortness of breath.   levalbuterol (XOPENEX) 0.63 MG/3ML nebulizer solution Take 3 mLs (0.63 mg total) by nebulization every 4 (four) hours as needed for wheezing or shortness of breath.   losartan (COZAAR) 50 MG tablet Take 1 tablet (50 mg total) by mouth daily.   metFORMIN (GLUCOPHAGE) 500 MG tablet Take 1 tablet (500 mg total) by mouth 2 (two) times daily with a meal.   methocarbamol (ROBAXIN) 500 MG tablet Take 1 tablet (500 mg total) by mouth every 8 (eight) hours as needed for muscle spasms.   metoprolol succinate (TOPROL-XL) 25 MG 24 hr tablet Take 1 tablet (25 mg total) by mouth daily. Take with or immediately following a meal.   nitroGLYCERIN (NITROSTAT) 0.4 MG SL tablet Place 1 tablet (0.4 mg total) under the tongue every 5 (five) minutes x 3 doses as needed for chest pain.   ONETOUCH VERIO test strip AS DIRECTED  ONCE A DAY   pantoprazole (PROTONIX) 40 MG tablet Take 1 tablet (40 mg total) by mouth 2 (two) times daily before a meal.   rosuvastatin (CRESTOR) 40 MG tablet Take 1 tablet (40 mg total) by mouth daily.   spironolactone (ALDACTONE) 50 MG tablet TAKE 1 TABLET(50 MG) BY MOUTH DAILY   Tiotropium Bromide-Olodaterol (STIOLTO RESPIMAT) 2.5-2.5 MCG/ACT AERS Inhale 2 puffs into the lungs daily.   warfarin (COUMADIN) 5 MG tablet TAKE 1 TABLET BY MOUTH DAILY   warfarin (COUMADIN) 7.5 MG tablet TAKE 1 TABLET BY MOUTH EVERY EVENING  (Patient taking differently: TAKE 1 TABLET BY MOUTH EVERY EVENING Or as directed by coumadin clinic)   [DISCONTINUED] methocarbamol (ROBAXIN) 500 MG tablet Take 1 tablet (500 mg total) by mouth every 8 (eight) hours as needed for muscle spasms.   [DISCONTINUED] predniSONE (STERAPRED UNI-PAK 21 TAB) 10 MG (21) TBPK tablet Take 6 today, 5 tomorrow, 4 the next day and then 3, 2, 1 and stop (Patient not taking: Reported on 02/26/2022)   No facility-administered medications prior to visit.   Reviewed past medical and social history.   ROS per HPI above      Objective:  BP (!) 132/58 (BP Location: Right Arm, Patient Position: Sitting)   Pulse (!) 58   Ht '5\' 5"'$  (1.651 m)   Wt 224 lb 12.8 oz (102 kg)   SpO2 96%   BMI 37.41 kg/m      Physical Exam Vitals reviewed.  Cardiovascular:     Rate and Rhythm: Normal rate.     Pulses: Normal pulses.  Pulmonary:     Effort: Pulmonary effort is normal.  Abdominal:     General: Bowel sounds are normal. There is no distension.     Palpations: Abdomen is soft.     Tenderness: There is no abdominal tenderness. There is no right CVA tenderness, left CVA tenderness or guarding.  Musculoskeletal:     Thoracic back: Normal.     Lumbar back: Spasms and tenderness present. No bony tenderness. Normal range of motion. Negative right straight leg raise test and negative left straight leg raise test. No scoliosis.       Back:     Right hip: Normal.     Left hip: Normal.     Right upper leg: Normal.     Left upper leg: Normal.     Right knee: Normal.     Left knee: Normal.     Comments: Guarded ROM  Lymphadenopathy:     Lower Body: No right inguinal adenopathy. No left inguinal adenopathy.  Neurological:     Mental Status: She is alert and oriented to person, place, and time.     Results for orders placed or performed in visit on 02/26/22  POCT urinalysis dipstick  Result Value Ref Range   Color, UA yellow    Clarity, UA clear    Glucose, UA  Negative Negative   Bilirubin, UA negative    Ketones, UA negative    Spec Grav, UA 1.020 1.010 - 1.025   Blood, UA 1+ (25 Ery/uL)    pH, UA 6.0 5.0 - 8.0   Protein, UA Positive (A) Negative   Urobilinogen, UA 0.2 0.2 or 1.0 E.U./dL   Nitrite, UA negative    Leukocytes, UA Negative Negative   Appearance     Odor        Assessment & Plan:    Problem List Items Addressed This Visit       Other  Chronic right-sided low back pain without sciatica    Acute on chronic back pain, worse in last 1week, pain is worse with prolonged standing or bending or twisting, no changein GI/GU function, no nausea/vomiting, no rash, no leg weakness or paresthesia. No radiculopathy. No hematuria No relief with tramadol and tylenol. she was seen by my colleague 2weeks ago and by provider at [facility] 3days ago. She did not take robaxin and oral prednisone prescribed 2weeks ago. Did not go for x-ray as recommended. She started hydrocodone-acetaminophen (5-'325mg'$ , #20) prescribed 3days ago. reports significant relief. UA completed indicates positive blood in urine. Previous UA completed 2020: positive hematuria CT ABD/pelvis completed 2020 : no acute finding.  Repeat UA and microscope today Advised to go for X-ray lumbar spine as previously ordered Alternate between warm and cold compress Start oral prednisone and robaxin as prescribed. Take tylenol '1000mg'$  and robaxin every 8hrs. Take hydrocodone for severe pain only Limit acetaminophen dose to no more than '4000mg'$  daily. F/up in 2weeks      Other Visit Diagnoses     Asymptomatic microscopic hematuria    -  Primary   Relevant Orders   Urine Microscopic Only   POCT urinalysis dipstick (Completed)      Return in about 2 weeks (around 03/12/2022) for back pain.     Wilfred Lacy, NP

## 2022-02-26 NOTE — Patient Instructions (Addendum)
Alternate between warm and cold compress Start oral prednisone and robaxin as prescribed. Take tylenol '1000mg'$  and robaxin every 8hrs. Take hydrocodone for severe pain only Limit acetaminophen dose to no more than '4000mg'$  daily.

## 2022-02-26 NOTE — Assessment & Plan Note (Signed)
Acute on chronic back pain, worse in last 1week, pain is worse with prolonged standing or bending or twisting, no changein GI/GU function, no nausea/vomiting, no rash, no leg weakness or paresthesia. No radiculopathy. No hematuria No relief with tramadol and tylenol. she was seen by my colleague 2weeks ago and by provider at [facility] clinic 3days ago. She did not take robaxin and oral prednisone prescribed 2weeks ago. Did not go for x-ray as recommended. She started hydrocodone-acetaminophen (5-'325mg'$ , #20) prescribed 3days ago. reports significant relief. UA completed indicates positive blood in urine. Previous UA completed 2020: positive hematuria CT ABD/pelvis completed 2020 : no acute finding.  Repeat UA and microscope today Advised to go for X-ray lumbar spine as previously ordered Alternate between warm and cold compress Start oral prednisone and robaxin as prescribed. Take tylenol '1000mg'$  and robaxin every 8hrs. Take hydrocodone for severe pain only Limit acetaminophen dose to no more than '4000mg'$  daily. F/up in 2weeks

## 2022-02-27 ENCOUNTER — Telehealth: Payer: Self-pay | Admitting: Nurse Practitioner

## 2022-02-27 ENCOUNTER — Ambulatory Visit (INDEPENDENT_AMBULATORY_CARE_PROVIDER_SITE_OTHER)
Admission: RE | Admit: 2022-02-27 | Discharge: 2022-02-27 | Disposition: A | Discharge status: Home / Self Care | Payer: Medicare Other | Source: Ambulatory Visit | Attending: Family Medicine | Admitting: Family Medicine

## 2022-02-27 ENCOUNTER — Encounter: Payer: Self-pay | Admitting: Nurse Practitioner

## 2022-02-27 DIAGNOSIS — M545 Low back pain, unspecified: Secondary | ICD-10-CM | POA: Diagnosis not present

## 2022-02-27 NOTE — Telephone Encounter (Signed)
Caller Name: Fernanda Twaddell Call back phone #: [phone]  Reason for Call: Pt was advised during appt yester day (1/09) to take muscle relaxer and 2 tylenol. She did so last night as well as this morning with no improvement. She is sitting waiting to get an X-ray while in a lot of pain

## 2022-03-01 ENCOUNTER — Encounter: Payer: Self-pay | Admitting: Nurse Practitioner

## 2022-03-01 ENCOUNTER — Other Ambulatory Visit: Payer: Self-pay | Admitting: Nurse Practitioner

## 2022-03-01 DIAGNOSIS — M5137 Other intervertebral disc degeneration, lumbosacral region: Secondary | ICD-10-CM

## 2022-03-01 DIAGNOSIS — G8929 Other chronic pain: Secondary | ICD-10-CM

## 2022-03-01 DIAGNOSIS — E1165 Type 2 diabetes mellitus with hyperglycemia: Secondary | ICD-10-CM

## 2022-03-01 DIAGNOSIS — M47816 Spondylosis without myelopathy or radiculopathy, lumbar region: Secondary | ICD-10-CM

## 2022-03-01 DIAGNOSIS — R739 Hyperglycemia, unspecified: Secondary | ICD-10-CM

## 2022-03-01 MED ORDER — PEN NEEDLES 31G X 6 MM MISC: 1.0000 | Freq: Every day | 0 refills | Status: DC

## 2022-03-01 MED ORDER — NOVOLOG FLEXPEN 100 UNIT/ML ~~LOC~~ SOPN: PEN_INJECTOR | SUBCUTANEOUS | 0 refills | Status: DC

## 2022-03-01 NOTE — Telephone Encounter (Signed)
Discussed lumbar x-ray results with Kimberly Hobbs. Advised about need for ortho referral. She reports glucose at 250 for last 2days with use of oral prednisone. Reports she is compliant with use of metformin '500mg'$  BID, last Hgba1c at 6.3% 28month ago Agreed to to temporal use of sliding scale insulin BID to improve glucose control till completion of oral prednisone.Unable to use NSAIDs due to coumadin therapy. No improvement with tylenol and muscle relaxant.

## 2022-03-04 ENCOUNTER — Telehealth: Payer: Self-pay | Admitting: Nurse Practitioner

## 2022-03-04 ENCOUNTER — Telehealth: Payer: Self-pay | Admitting: Cardiology

## 2022-03-04 DIAGNOSIS — Z952 Presence of prosthetic heart valve: Secondary | ICD-10-CM | POA: Diagnosis not present

## 2022-03-04 DIAGNOSIS — I480 Paroxysmal atrial fibrillation: Secondary | ICD-10-CM

## 2022-03-04 DIAGNOSIS — E1165 Type 2 diabetes mellitus with hyperglycemia: Secondary | ICD-10-CM

## 2022-03-04 NOTE — Telephone Encounter (Signed)
Pt is saying she never got her script for insulin that she discussed with Baldo Ash on 03/01/22.  [facility] [place], [place] - [address] AT [place] [address], [place] [address] Phone: [phone]  Fax: [phone] DEA #: [id number]

## 2022-03-04 NOTE — Telephone Encounter (Signed)
Pt has an upcoming appointment with Dr. Lorin Mercy that [place] referred her to for back pain on 03/26/22. He has let her know she will need an order for a MRI before her visit on 03/26/22. Please call pt at [phone] [facility])

## 2022-03-04 NOTE — Telephone Encounter (Signed)
Documentation reviewed from 12/17/2021 to 02/13/2022. INR therapeutic (2.5-3.5) except one reading of INR 2.2 on 02/11/2022. INR 3.0 on 02/13/2022.   Nigel Mormon, MD Pager: [phone] Office: [phone]

## 2022-03-05 MED ORDER — NOVOLOG FLEXPEN 100 UNIT/ML ~~LOC~~ SOPN: PEN_INJECTOR | SUBCUTANEOUS | 0 refills | Status: DC

## 2022-03-05 MED ORDER — PEN NEEDLES 31G X 6 MM MISC: 1.0000 | Freq: Two times a day (BID) | 0 refills | Status: DC

## 2022-03-05 NOTE — Addendum Note (Signed)
Addended by: Wilfred Lacy L on: 03/05/2022 02:30 PM   Modules accepted: Orders

## 2022-03-06 ENCOUNTER — Ambulatory Visit: Payer: Medicare Other | Admitting: Cardiology

## 2022-03-06 ENCOUNTER — Ambulatory Visit
Admission: RE | Admit: 2022-03-06 | Discharge: 2022-03-06 | Disposition: A | Discharge status: Home / Self Care | Payer: Medicare Other | Source: Ambulatory Visit | Attending: Nurse Practitioner | Admitting: Nurse Practitioner

## 2022-03-06 DIAGNOSIS — M5416 Radiculopathy, lumbar region: Secondary | ICD-10-CM | POA: Diagnosis not present

## 2022-03-06 DIAGNOSIS — G8929 Other chronic pain: Secondary | ICD-10-CM

## 2022-03-06 DIAGNOSIS — M545 Low back pain, unspecified: Secondary | ICD-10-CM | POA: Diagnosis not present

## 2022-03-06 DIAGNOSIS — M5137 Other intervertebral disc degeneration, lumbosacral region: Secondary | ICD-10-CM

## 2022-03-06 DIAGNOSIS — M4316 Spondylolisthesis, lumbar region: Secondary | ICD-10-CM | POA: Diagnosis not present

## 2022-03-07 ENCOUNTER — Encounter: Payer: Self-pay | Admitting: Cardiology

## 2022-03-07 ENCOUNTER — Telehealth: Payer: Self-pay

## 2022-03-07 ENCOUNTER — Ambulatory Visit: Payer: 59 | Admitting: Cardiology

## 2022-03-07 VITALS — BP 150/59 | HR 119 | Resp 16 | Ht 65.0 in | Wt 220.8 lb

## 2022-03-07 DIAGNOSIS — I25708 Atherosclerosis of coronary artery bypass graft(s), unspecified, with other forms of angina pectoris: Secondary | ICD-10-CM

## 2022-03-07 DIAGNOSIS — I480 Paroxysmal atrial fibrillation: Secondary | ICD-10-CM

## 2022-03-07 DIAGNOSIS — Z952 Presence of prosthetic heart valve: Secondary | ICD-10-CM

## 2022-03-07 MED ORDER — AMIODARONE HCL 200 MG PO TABS: 200.0000 mg | ORAL_TABLET | Freq: Every day | ORAL | 0 refills | Status: DC

## 2022-03-07 NOTE — Telephone Encounter (Signed)
Patient calling, she woke up at 3 AM feeling like she is in A-Fib. Feelings like she has a fluctuating HR and slightly nauseated.   Will schedule patient for an EKG.

## 2022-03-07 NOTE — Telephone Encounter (Signed)
Ok. Keep me posted.  Thanks MJP

## 2022-03-07 NOTE — Addendum Note (Signed)
Addended by: Wilfred Lacy L on: 03/07/2022 12:09 PM   Modules accepted: Orders

## 2022-03-07 NOTE — Progress Notes (Signed)
Subjective:   Kimberly Hobbs, female    DOB: 08/24/1960, 62 y.o.   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  Patient noted that she had been in A-fib since yesterday.  Generally, she is able to tell when she does go into A-fib.  Other than palpitations, she has no other symptoms.  She has been feeling dehydrated with dry mouth for last day or so.   Current Outpatient Medications:    acetaminophen (TYLENOL) 500 MG tablet, Take 500 mg by mouth every 6 (six) hours as needed (for pain.)., Disp: , Rfl:    amiodarone (PACERONE) 200 MG tablet, Take 1 tablet (200 mg total) by mouth daily., Disp: 90 tablet, Rfl: 3   aspirin EC 81 MG tablet, Take 81 mg by mouth daily. , Disp: , Rfl:    azelastine (ASTELIN) 0.1 % nasal spray, Place 1 spray into both nostrils 2 (two) times daily. Use in each nostril as directed, Disp: 30 mL, Rfl: 5   cetirizine (ZYRTEC) 10 MG tablet, Take 1 tablet (10 mg total) by mouth daily., Disp: 30 tablet, Rfl: 11   citalopram (CELEXA) 40 MG tablet, Take 0.5 tablets (20 mg total) by mouth 2 (two) times daily., Disp: 90 tablet, Rfl: 3   clonazePAM (KLONOPIN) 0.5 MG tablet, Take 1.5 tablets (0.75 mg total) by mouth at bedtime., Disp: 45 tablet, Rfl: 2   diltiazem (CARDIZEM CD) 180 MG 24 hr capsule, TAKE 1 CAPSULE BY MOUTH DAILY, Disp: 90 capsule, Rfl: 2   furosemide (LASIX) 20 MG tablet, TAKE 1 TABLET BY MOUTH DAILY, Disp: 90 tablet, Rfl: 2   insulin aspart (NOVOLOG FLEXPEN) 100 UNIT/ML FlexPen, Insulin (Novolog) sliding scale for fast acting insulin (Novolog ): administer 48mnutes before breakfast and dinner if Sugar >150 - no units Sugar >151-200 - 2 units Sugar >200-250 - 6 units Sugar >251-300 - 8 units and  call office Sugar >350 - 10 units and call office., Disp: 3 mL, Rfl: 0   Insulin Pen Needle (PEN NEEDLES) 31G X 6 MM MISC, 1 Application by Does not apply route 2 (two) times daily between meals., Disp: 30 each, Rfl: 0   isosorbide mononitrate (IMDUR) 60 MG 24 hr tablet, TAKE 1 TABLET(60 MG) BY MOUTH DAILY, Disp: 90 tablet, Rfl: 3   Lancets (ONETOUCH DELICA PLUS [id number] MISC, TAKE AS DIRECTED ONCE DAILY., Disp: , Rfl:    levalbuterol (XOPENEX HFA) 45 MCG/ACT inhaler, Inhale 1-2 puffs into the lungs every 6 (six) hours as needed for shortness of breath., Disp: 1 each, Rfl: 12   levalbuterol (XOPENEX) 0.63 MG/3ML nebulizer solution, Take 3 mLs (0.63 mg total) by nebulization every 4 (four) hours as needed for wheezing or shortness of breath., Disp: 3 mL, Rfl: 12   losartan (COZAAR) 50 MG tablet, Take 1 tablet (50 mg total) by mouth daily., Disp: 90 tablet, Rfl: 3   metFORMIN (GLUCOPHAGE) 500 MG tablet, Take 1 tablet (500 mg total) by mouth 2 (two) times daily with a meal., Disp: 180 tablet, Rfl: 3   methocarbamol (ROBAXIN) 500 MG tablet, Take 1 tablet (500 mg total) by mouth every 8 (eight) hours as needed for muscle spasms., Disp: 40 tablet, Rfl: 0   metoprolol succinate (TOPROL-XL) 25 MG 24 hr tablet, Take 1 tablet (  25 mg total) by mouth daily. Take with or immediately following a meal., Disp: 90 tablet, Rfl: 3   nitroGLYCERIN (NITROSTAT) 0.4 MG SL tablet, Place 1 tablet (0.4 mg total) under the tongue every 5 (five) minutes x 3 doses as needed for chest pain., Disp: 25 tablet, Rfl: 3   ONETOUCH VERIO test strip, AS DIRECTED ONCE A DAY, Disp: , Rfl:    pantoprazole (PROTONIX) 40 MG tablet, Take 1 tablet (40 mg total) by mouth 2 (two) times daily before a meal., Disp: 180 tablet, Rfl: 3   rosuvastatin (CRESTOR) 40 MG tablet, Take 1 tablet (40 mg total) by mouth daily., Disp: 90 tablet, Rfl: 1   spironolactone (ALDACTONE) 50 MG tablet, TAKE 1 TABLET(50 MG) BY MOUTH DAILY, Disp: 30 tablet, Rfl: 6    Tiotropium Bromide-Olodaterol (STIOLTO RESPIMAT) 2.5-2.5 MCG/ACT AERS, Inhale 2 puffs into the lungs daily., Disp: 4 g, Rfl: 0   warfarin (COUMADIN) 5 MG tablet, TAKE 1 TABLET BY MOUTH DAILY, Disp: 90 tablet, Rfl: 2   warfarin (COUMADIN) 7.5 MG tablet, TAKE 1 TABLET BY MOUTH EVERY EVENING (Patient taking differently: TAKE 1 TABLET BY MOUTH EVERY EVENING Or as directed by coumadin clinic), Disp: 90 tablet, Rfl: 2  Cardiovascular studies:  EKG 03/07/2022: Atrial fibrillation 107 bpm IVCD Anteroseptal infarct -age undetermined Nonspecific ST depression -Nondiagnostic  Coronary and bypass graft angiography 07/21/2018 and 08/18/2018: LM: Normal LAD: 100% mid occlusion. Mild distal disease LIMA-LAD: Patent LCx: Normal RCA: Prox 60% stenosis, mid 30-40% stenoses. TIMI III flow in prox-mid RCA. 100% distal occlusion SVG-RCA: Patent. Ostial 40%. Distal RCA moderate diffuse disease.    Guide catheter angiography provided superior images. There is TIMI III flow in SVG which fills RCA all the way to the ostium. Even if she were to have FFR positive lesion in the graft, I do not think the benefits of a stent would outweigh the risk of losing the graft and the entire RCA territory circulation in a borderline lesion. Also, the prox RCA lesion is well bypassed by the SVG graft. Thus, I decided not to perform FFR/PCI to either of these lesions. Continue medical management.   [place] 07/21/2018: #1: RA: 18 mmHg RV 90/12 mmHg PA: 94/40 mmHg. Mean PA 63 mmHg. PW 33 mmHg  Lasix 80 mg  #2: RA 19 mmHg RV 60/0 mmHg PA: 56/16 mmHg. Mean PA 36 mmHg PW: 25 mmHg  CO: 5.4 L/min. CI 2.6 L/min/m2 PVR 2 WU  Impression: Elevated filling pressures Moderate WHO Grp II pulmonary hypertension Improvement in filling pressures with diuresis.   Echocardiogram 02/26/2018: Left ventricle cavity is normal in size. Mild concentric hypertrophy of the left ventricle. Abnormal septal wall motion due to post-operative valve.  Diastolic function could not be assessed due to post op valve and CABG status.  Calculated EF 63%. Left atrial cavity is moderate to severely dilated measures 4.5 cm in long axis. Right atrial cavity is mildly dilated. Mechanical aortic valve with trace regurgitation. Mild aortic valve leaflet calcification. Mildly restricted aortic valve leaflets. Mild to moderate aortic valve stenosis. Aortic valve peak pressure gradient of  43 and mean gradient of 21 mmHg, calculated aortic valve area    0.88 cm. Mechanical mitral valve with trace regurgitation. Moderately restricted mitral valve leaflets. Mild mitral valve stenosis. Mitral valve peak pressure gradient of  26  and mean gradient of  6.2  mmHg, calculated mitral valve area 1.9   cm. Mild to moderate tricuspid regurgitation. Mild pulmonary hypertension. PA systolic pressure estimated at 39  mm Hg. Compared to the study done on 08/06/2017, no significant change.  TEE 07/21/2018:  1. The left ventricle has normal systolic function, with an ejection fraction of 55-60%. There is abnormal septal motion consistent with post-operative status.  2. The right ventricle has normal systolc function.  3. A 25 mm Regent mechanical valve is present in the mitral position. Procedure Date: 2018 Echo findings are consistent with is functioning normally the mitral prosthesis. Normal mitral valve prosthesis. Mean PG 6 mmHg, MVA 1.7 cm2. No thrombus seen.  (Patient known to have subtherapeutic INR).  4. A 59m [employer] mechanical prosthesis valve is present in the aortic position. Procedure Date: 2018 Normal aortic valve prosthesis. Echo findings shows no evidence of Accelration time of 88 msec suggests normal functioning valve. Mean PG 25 mmHg  likely due to small annular diameter. No prosthetic valve stenosis noted. of the aortic prosthesis. No thrombus seen. (Patient known to have subtherapeutic INR).  Recent labs: 12/24/2021: Glucose 96, BUN/Cr 22/1.26. EGFR 46. Na/K  137/5.1.  HbA1C 6.3%  07/2021: H/H 10.8/34. MCV 80. Platelets 240  04/2021: Chol 116, TG 115, HDL 45, LDL 48  06/2020: TSH 2.8 normal  02/16/2018: BNP 251 elevated   Review of Systems  Cardiovascular:  Positive for palpitations. Negative for chest pain, dyspnea on exertion, leg swelling and syncope.  Musculoskeletal:  Positive for joint pain.       Right forearm/elbow pain  Neurological:  Positive for dizziness.         Vitals:   03/07/22 1011  BP: (!) 150/59  Pulse: (!) 119  Resp: 16  SpO2: 95%     Objective:     Physical Exam Vitals and nursing note reviewed.  Constitutional:      General: She is not in acute distress. Neck:     Vascular: No JVD.  Cardiovascular:     Rate and Rhythm: Tachycardia present. Rhythm irregular.     Heart sounds: No murmur heard.    Comments: Metallic S1, S2 Pulmonary:     Effort: Pulmonary effort is normal.     Breath sounds: Normal breath sounds. No wheezing or rales.  Musculoskeletal:     Right lower leg: No edema.     Left lower leg: No edema.          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  Paroxysmal Afib: Currently in sinus rhythm Currently on metoprolol succinate 25 mg daily, diltiazem 180 mg daily, amiodarone 200 mg daily.  Recommend increasing amiodarone to 200 mg twice daily for next 4 days in hopes of converting to sinus rhythm, as she has done in the past. If she does not, we will plan on cardioversion next week. Check CMP, TSH given ongoing amiodarone use. High CHA2DS2VAsc score. Continue warfarin. She checks her vitals regularly, and they have been >2.5.   CAD of native and bypass grafts without angina: Patent grafts with non-critical  disease.(07/2018). Continue Aspirin, statin.  S/p MVR, AVR: Continue warfarin  Hypertension: Controlled.  F/u after cardioversion    MNigel Mormon MD Pager: [phone]: [phone]

## 2022-03-08 LAB — COMPREHENSIVE METABOLIC PANEL
ALT: 25 IU/L (ref 0–32)
AST: 18 IU/L (ref 0–40)
Albumin/Globulin Ratio: 1.6 (ref 1.2–2.2)
Albumin: 4.6 g/dL (ref 3.8–4.9)
Alkaline Phosphatase: 122 IU/L — ABNORMAL HIGH (ref 44–121)
BUN/Creatinine Ratio: 22 (ref 12–28)
BUN: 31 mg/dL — ABNORMAL HIGH (ref 8–27)
Bilirubin Total: 0.3 mg/dL (ref 0.0–1.2)
CO2: 16 mmol/L — ABNORMAL LOW (ref 20–29)
Calcium: 9.5 mg/dL (ref 8.7–10.3)
Chloride: 103 mmol/L (ref 96–106)
Creatinine, Ser: 1.43 mg/dL — ABNORMAL HIGH (ref 0.57–1.00)
Globulin, Total: 2.9 g/dL (ref 1.5–4.5)
Glucose: 106 mg/dL — ABNORMAL HIGH (ref 70–99)
Potassium: 4.8 mmol/L (ref 3.5–5.2)
Sodium: 137 mmol/L (ref 134–144)
Total Protein: 7.5 g/dL (ref 6.0–8.5)
eGFR: 42 mL/min/{1.73_m2} — ABNORMAL LOW (ref >59)

## 2022-03-08 LAB — TSH: TSH: 3.05 u[IU]/mL (ref 0.450–4.500)

## 2022-03-08 LAB — CBC
Hematocrit: 34.3 % (ref 34.0–46.6)
Hemoglobin: 10.5 g/dL — ABNORMAL LOW (ref 11.1–15.9)
MCH: 22.3 pg — ABNORMAL LOW (ref 26.6–33.0)
MCHC: 30.6 g/dL — ABNORMAL LOW (ref 31.5–35.7)
MCV: 73 fL — ABNORMAL LOW (ref 79–97)
Platelets: 327 10*3/uL (ref 150–450)
RBC: 4.71 x10E6/uL (ref 3.77–5.28)
RDW: 17.1 % — ABNORMAL HIGH (ref 11.7–15.4)
WBC: 11.1 10*3/uL — ABNORMAL HIGH (ref 3.4–10.8)

## 2022-03-12 ENCOUNTER — Ambulatory Visit: Payer: 59 | Admitting: Nurse Practitioner

## 2022-03-12 ENCOUNTER — Encounter: Payer: Self-pay | Admitting: Cardiology

## 2022-03-12 NOTE — Telephone Encounter (Signed)
Yes. Keep taking 2 amio until cardioversion.  Thanks MJP

## 2022-03-12 NOTE — Telephone Encounter (Signed)
From patient

## 2022-03-14 ENCOUNTER — Ambulatory Visit: Payer: Medicare Other | Admitting: Cardiology

## 2022-03-15 ENCOUNTER — Telehealth: Payer: Self-pay

## 2022-03-15 DIAGNOSIS — J449 Chronic obstructive pulmonary disease, unspecified: Secondary | ICD-10-CM | POA: Diagnosis not present

## 2022-03-15 DIAGNOSIS — J310 Chronic rhinitis: Secondary | ICD-10-CM | POA: Diagnosis not present

## 2022-03-20 ENCOUNTER — Ambulatory Visit: Payer: Medicare Other | Admitting: Nurse Practitioner

## 2022-03-25 ENCOUNTER — Other Ambulatory Visit: Payer: Self-pay | Admitting: Nurse Practitioner

## 2022-03-25 DIAGNOSIS — F411 Generalized anxiety disorder: Secondary | ICD-10-CM

## 2022-03-26 ENCOUNTER — Encounter: Payer: Self-pay | Admitting: Nurse Practitioner

## 2022-03-26 ENCOUNTER — Ambulatory Visit (INDEPENDENT_AMBULATORY_CARE_PROVIDER_SITE_OTHER): Payer: 59 | Admitting: Nurse Practitioner

## 2022-03-26 ENCOUNTER — Telehealth: Payer: Self-pay | Admitting: Nurse Practitioner

## 2022-03-26 ENCOUNTER — Ambulatory Visit (INDEPENDENT_AMBULATORY_CARE_PROVIDER_SITE_OTHER): Payer: 59 | Admitting: Orthopaedic Surgery

## 2022-03-26 ENCOUNTER — Ambulatory Visit: Payer: 59 | Admitting: Nurse Practitioner

## 2022-03-26 ENCOUNTER — Encounter: Payer: Self-pay | Admitting: Orthopaedic Surgery

## 2022-03-26 VITALS — BP 102/78 | HR 78 | Temp 97.6°F | Resp 16 | Ht 65.0 in | Wt 220.0 lb

## 2022-03-26 VITALS — BP 109/71 | HR 101 | Ht 65.0 in | Wt 220.0 lb

## 2022-03-26 DIAGNOSIS — I272 Pulmonary hypertension, unspecified: Secondary | ICD-10-CM | POA: Diagnosis not present

## 2022-03-26 DIAGNOSIS — E1165 Type 2 diabetes mellitus with hyperglycemia: Secondary | ICD-10-CM

## 2022-03-26 DIAGNOSIS — G8929 Other chronic pain: Secondary | ICD-10-CM

## 2022-03-26 DIAGNOSIS — Z952 Presence of prosthetic heart valve: Secondary | ICD-10-CM | POA: Diagnosis not present

## 2022-03-26 DIAGNOSIS — N1832 Chronic kidney disease, stage 3b: Secondary | ICD-10-CM

## 2022-03-26 DIAGNOSIS — M5137 Other intervertebral disc degeneration, lumbosacral region: Secondary | ICD-10-CM | POA: Insufficient documentation

## 2022-03-26 DIAGNOSIS — M48061 Spinal stenosis, lumbar region without neurogenic claudication: Secondary | ICD-10-CM

## 2022-03-26 DIAGNOSIS — M545 Low back pain, unspecified: Secondary | ICD-10-CM | POA: Diagnosis not present

## 2022-03-26 DIAGNOSIS — Z7901 Long term (current) use of anticoagulants: Secondary | ICD-10-CM | POA: Diagnosis not present

## 2022-03-26 DIAGNOSIS — I480 Paroxysmal atrial fibrillation: Secondary | ICD-10-CM | POA: Diagnosis not present

## 2022-03-26 LAB — POCT GLYCOSYLATED HEMOGLOBIN (HGB A1C): Hemoglobin A1C: 5.6 % (ref 4.0–5.6)

## 2022-03-26 NOTE — Assessment & Plan Note (Signed)
Secondary to DDD Had appt with ortho-Dr. Lorin Mercy, refer for outpatient PT but needs to start only after cardioversion on 04/02/2022. Advised to use tylenol and robaxin every 8hrs and hydrocodone for severe pain. Provided order for shower chair and walker

## 2022-03-26 NOTE — Patient Instructions (Signed)
Use robaxin every 8hrs hgbA1c at 5.6%: improved  Maintain current medications

## 2022-03-26 NOTE — Assessment & Plan Note (Addendum)
Stable, stable electrolytes Creatinine-microalbumin ration: 23 Current use of losartan, furosemide and spironolactone. No Le edema Stable microcytic anemia.

## 2022-03-26 NOTE — Assessment & Plan Note (Signed)
Stable BP SOB with exertion due to A-fib Under the care of cardiology: Dr. Virgina Jock

## 2022-03-26 NOTE — Assessment & Plan Note (Signed)
Repeat hgbA1c at 5.6% Home glucose: 100s Normal foot exam today. Maintain metformin dose

## 2022-03-26 NOTE — Progress Notes (Signed)
Established Patient Visit  Patient: Kimberly Hobbs   DOB: 11/12/1960   62 y.o. Female  MRN: [id number] Visit Date: 03/26/2022  Subjective:    Chief Complaint  Patient presents with   Office Visit    Pt is here for 3 month f/u and everything is good. She has been checking her blood sugar and bp    HPI Chronic right-sided low back pain without sciatica Secondary to DDD Had appt with ortho-Dr. Lorin Mercy, refer for outpatient PT but needs to start only after cardioversion on 04/02/2022. Advised to use tylenol and robaxin every 8hrs and hydrocodone for severe pain. Provided order for shower chair and walker  Pulmonary hypertension, unspecified ([place]) Stable BP SOB with exertion due to A-fib Under the care of cardiology: Dr. Virgina Jock  CKD stage 3 due to type 2 diabetes mellitus (HCC) Stable, stable electrolytes Creatinine-microalbumin ration: 23 Current use of losartan, furosemide and spironolactone. No Le edema Stable microcytic anemia.  Type 2 diabetes mellitus with hyperglycemia, without long-term current use of insulin (HCC) Repeat hgbA1c at 5.6% Home glucose: 100s Normal foot exam today. Maintain metformin dose  Reviewed medical, surgical, and social history today  Medications: Outpatient Medications Prior to Visit  Medication Sig   BD PEN NEEDLE NANO 2ND GEN 32G X 4 MM MISC USE 1 PEN NEEDLE TWICE DAILY BETWEEN MEALS   acetaminophen (TYLENOL) 500 MG tablet Take 500 mg by mouth every 6 (six) hours as needed (for pain.).   amiodarone (PACERONE) 200 MG tablet Take 1 tablet (200 mg total) by mouth daily. Take 1 pill twice a day 03/07/2022-03/10/2022   aspirin EC 81 MG tablet Take 81 mg by mouth daily.    azelastine (ASTELIN) 0.1 % nasal spray Place 1 spray into both nostrils 2 (two) times daily. Use in each nostril as directed   cetirizine (ZYRTEC) 10 MG tablet Take 1 tablet (10 mg total) by mouth daily.   citalopram (CELEXA) 40 MG tablet Take 0.5 tablets (20 mg  total) by mouth 2 (two) times daily.   [START ON 03/29/2022] clonazePAM (KLONOPIN) 0.5 MG tablet TAKE 1 AND 1/2 TABLETS(0.75 MG) BY MOUTH AT BEDTIME   diltiazem (CARDIZEM CD) 180 MG 24 hr capsule TAKE 1 CAPSULE BY MOUTH DAILY   furosemide (LASIX) 20 MG tablet TAKE 1 TABLET BY MOUTH DAILY   HYDROcodone-acetaminophen (NORCO/VICODIN) 5-325 MG tablet Take 1 tablet by mouth every 6 (six) hours.   isosorbide mononitrate (IMDUR) 60 MG 24 hr tablet TAKE 1 TABLET(60 MG) BY MOUTH DAILY   Lancets (ONETOUCH DELICA PLUS [id number]) MISC TAKE AS DIRECTED ONCE DAILY.   levalbuterol (XOPENEX HFA) 45 MCG/ACT inhaler Inhale 1-2 puffs into the lungs every 6 (six) hours as needed for shortness of breath.   levalbuterol (XOPENEX) 0.63 MG/3ML nebulizer solution Take 3 mLs (0.63 mg total) by nebulization every 4 (four) hours as needed for wheezing or shortness of breath.   losartan (COZAAR) 50 MG tablet Take 1 tablet (50 mg total) by mouth daily.   metFORMIN (GLUCOPHAGE) 500 MG tablet Take 1 tablet (500 mg total) by mouth 2 (two) times daily with a meal.   methocarbamol (ROBAXIN) 500 MG tablet Take 1 tablet (500 mg total) by mouth every 8 (eight) hours as needed for muscle spasms.   metoprolol succinate (TOPROL-XL) 25 MG 24 hr tablet Take 1 tablet (25 mg total) by mouth daily. Take with or immediately following a meal.   nitroGLYCERIN (NITROSTAT)  0.4 MG SL tablet Place 1 tablet (0.4 mg total) under the tongue every 5 (five) minutes x 3 doses as needed for chest pain.   ONETOUCH VERIO test strip AS DIRECTED ONCE A DAY   pantoprazole (PROTONIX) 40 MG tablet Take 1 tablet (40 mg total) by mouth 2 (two) times daily before a meal.   rosuvastatin (CRESTOR) 40 MG tablet Take 1 tablet (40 mg total) by mouth daily.   spironolactone (ALDACTONE) 50 MG tablet TAKE 1 TABLET(50 MG) BY MOUTH DAILY   Tiotropium Bromide-Olodaterol (STIOLTO RESPIMAT) 2.5-2.5 MCG/ACT AERS Inhale 2 puffs into the lungs daily.   warfarin (COUMADIN) 5 MG tablet  TAKE 1 TABLET BY MOUTH DAILY   warfarin (COUMADIN) 7.5 MG tablet TAKE 1 TABLET BY MOUTH EVERY EVENING (Patient taking differently: TAKE 1 TABLET BY MOUTH EVERY EVENING Or as directed by coumadin clinic)   [DISCONTINUED] insulin aspart (NOVOLOG FLEXPEN) 100 UNIT/ML FlexPen Insulin (Novolog) sliding scale for fast acting insulin (Novolog ): administer 33mnutes before breakfast and dinner if Sugar >150 - no units Sugar >151-200 - 2 units Sugar >200-250 - 6 units Sugar >251-300 - 8 units and call office Sugar >350 - 10 units and call office. (Patient not taking: Reported on 03/07/2022)   [DISCONTINUED] Insulin Pen Needle (PEN NEEDLES) 31G X 6 MM MISC 1 Application by Does not apply route 2 (two) times daily between meals.   No facility-administered medications prior to visit.   Reviewed past medical and social history.   ROS per HPI above      Objective:  BP 102/78 (BP Location: Right Arm, Patient Position: Sitting, Cuff Size: Large)   Pulse 78   Temp 97.6 F (36.4 C) (Temporal)   Resp 16   Ht '5\' 5"'$  (1.651 m)   Wt 220 lb (99.8 kg)   LMP 06/20/2015   SpO2 98%   BMI 36.61 kg/m      Physical Exam  Results for orders placed or performed in visit on 03/26/22  POCT glycosylated hemoglobin (Hb A1C)  Result Value Ref Range   Hemoglobin A1C 5.6 4.0 - 5.6 %   HbA1c POC (<> result, manual entry)     HbA1c, POC (prediabetic range)     HbA1c, POC (controlled diabetic range)        Assessment & Plan:    Problem List Items Addressed This Visit       Cardiovascular and Mediastinum   Pulmonary hypertension, unspecified ([place]    Stable BP SOB with exertion due to A-fib Under the care of cardiology: Dr. PVirgina Jock       Endocrine   CKD stage 3 due to type 2 diabetes mellitus (HCC)    Stable, stable electrolytes Creatinine-microalbumin ration: 23 Current use of losartan, furosemide and spironolactone. No Le edema Stable microcytic anemia.      Type 2 diabetes mellitus with  hyperglycemia, without long-term current use of insulin (HCC)    Repeat hgbA1c at 5.6% Home glucose: 100s Normal foot exam today. Maintain metformin dose      Relevant Orders   POCT glycosylated hemoglobin (Hb A1C) (Completed)     Musculoskeletal and Integument   DDD (degenerative disc disease), lumbosacral   Relevant Orders   For home use only DME 4 wheeled rolling walker with seat (([id number]   For home use only DME Shower stool     Other   Chronic right-sided low back pain without sciatica - Primary    Secondary to DDD Had appt with ortho-Dr. YLorin Mercy refer for  outpatient PT but needs to start only after cardioversion on 04/02/2022. Advised to use tylenol and robaxin every 8hrs and hydrocodone for severe pain. Provided order for shower chair and walker      Relevant Orders   For home use only DME 4 wheeled rolling walker with seat ([id number])   For home use only DME Shower stool   Return in about 6 months (around 09/24/2022) for DM, hyperlipidemia (fasting).     Wilfred Lacy, NP

## 2022-03-26 NOTE — Telephone Encounter (Signed)
Zoey from [facility] called and said they are missing clinical face to face notes, height and weight. Contact: [phone] and fax: [phone]. Just ask for Zoey if you call. It was for a Rolator and shower chair with a back

## 2022-03-26 NOTE — Progress Notes (Signed)
Office Visit Note   Patient: Kimberly Hobbs           Date of Birth: [birth year]           MRN: [id number] Visit Date: 03/26/2022              Requested by: Flossie Buffy, NP [place],  [place] [id number] PCP: Flossie Buffy, NP   Assessment & Plan: Visit Diagnoses:  1. Stenosis of lateral recess of lumbar spine     Plan: We reviewed plain radiographs and MRI.  She has some L4-5 moderate facet arthropathy with some lateral recess narrowing but no significant central compression.  Trace anterolisthesis at L4-5 likely the symptomatic level.  We can have her come back in 2 months post cardioversion.  Currently she is on warfarin and will defer further treatment options such as epidural injection pending treatment for atrial fibs.  Follow-Up Instructions: Return in about 2 months (around 05/25/2022).   Orders:  No orders of the defined types were placed in this encounter.  No orders of the defined types were placed in this encounter.     Procedures: No procedures performed   Clinical Data: No additional findings.   Subjective: Chief Complaint  Patient presents with   Lower Back - Pain    HPI 62 year old female new patient visit with back pain x 1 year worse in the right than left side.  Does not radiate down her leg no numbness or tingling.  Patient been on muscle relaxants hydrocodone.  Originally patient could sit to get relief.  She is taking care of her dying mother in July and has been very active.  She is on warfarin for A-fib pleasant to take any anti-inflammatories due to the warfarin.  She gets some relief with supine position.  When she walks around [place] she tends to get worse.  Additionally she has problems with aortic and mitral valve and sufficiency rheumatic disease of the valves.  Coronary artery disease history of CABG atrial fib anxiety type 2 diabetes with hyperglycemia.  Patient is pending cardioversion for atrial fibs on  04/02/2022 coming up in a week.   Review of Systems all systems noncontributory to HPI.   Objective: Vital Signs: BP 109/71   Pulse (!) 101   Ht '5\' 5"'$  (1.651 m)   Wt 220 lb (99.8 kg)   LMP 06/20/2015   BMI 36.61 kg/m   Physical Exam Constitutional:      Appearance: She is well-developed.  HENT:     Head: Normocephalic.     Right Ear: External ear normal.     Left Ear: External ear normal. There is no impacted cerumen.  Eyes:     Pupils: Pupils are equal, round, and reactive to light.  Neck:     Thyroid: No thyromegaly.     Trachea: No tracheal deviation.  Cardiovascular:     Rate and Rhythm: Normal rate.  Pulmonary:     Effort: Pulmonary effort is normal.  Abdominal:     Palpations: Abdomen is soft.  Musculoskeletal:     Cervical back: No rigidity.  Skin:    General: Skin is warm and dry.  Neurological:     Mental Status: She is alert and oriented to person, place, and time.  Psychiatric:        Behavior: Behavior normal.     Ortho Exam some sciatic notch tenderness on the right.  Negative logroll the hips.  Reflexes are intact anterior  tib gastrocsoleus is intact.  Specialty Comments:  No specialty comments available.  Imaging: arrative & Impression  CLINICAL DATA:  Lumbar radiculopathy   EXAM: MRI LUMBAR SPINE WITHOUT CONTRAST   TECHNIQUE: Multiplanar, multisequence MR imaging of the lumbar spine was performed. No intravenous contrast was administered.   COMPARISON:  None Available.   FINDINGS: Segmentation:  5 lumbar type vertebral bodies.   Alignment: S shaped curvature of the thoracolumbar spine, with dextrocurvature of the thoracolumbar junction and compensatory levocurvature of the lower lumbar spine. 3 mm anterolisthesis of L4 on L5.   Vertebrae:  No fracture, evidence of discitis, or bone lesion.   Conus medullaris and cauda equina: Conus extends to the L2 level. Conus and cauda equina appear normal.   Paraspinal and other soft  tissues: Small right renal cyst, for which no follow-up is currently indicated.   Disc levels:   T12-L1: No significant disc bulge. No spinal canal stenosis or neural foraminal narrowing.   L1-L2: No significant disc bulge. No spinal canal stenosis or neural foraminal narrowing.   L2-L3: Minimal disc bulge. No spinal canal stenosis or neural foraminal narrowing.   L3-L4: No significant disc bulge. Mild facet arthropathy. No spinal canal stenosis or neural foraminal narrowing.   L4-L5: Trace anterolisthesis with disc unroofing. Moderate facet arthropathy. Narrowing of the lateral recesses. No spinal canal stenosis or neural foraminal narrowing.   L5-S1: No significant disc bulge. Mild right facet arthropathy. No spinal canal stenosis or neural foraminal narrowing.   IMPRESSION: 1. L4-L5 moderate facet arthropathy, which can be a cause of back pain. Narrowing of the lateral recesses at this level could affect the descending L5 nerve roots. 2. No spinal canal stenosis or neural foraminal narrowing.     Electronically Signed   By: Merilyn Baba M.D.   On: 03/07/2022 03:44     PMFS History: Patient Active Problem List   Diagnosis Date Noted   DDD (degenerative disc disease), lumbosacral 03/26/2022   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   Chronic right-sided low back pain without sciatica 02/26/2022   Cervical strain 12/20/2021   Sensorineural hearing loss (SNHL) of both ears 10/24/2021   Hemorrhoids 05/18/2021   CKD stage 3 due to type 2 diabetes mellitus ([place]) 05/15/2021   Paroxysmal atrial fibrillation ([place]) 03/28/2021   GAD (generalized anxiety disorder) 02/01/2021   Type 2 diabetes mellitus with hyperglycemia, without long-term current use of insulin ([place]) 02/01/2021   Injury of right elbow 01/17/2020   Monitoring for long-term anticoagulant use 08/24/2019   Encounter for therapeutic drug monitoring 07/27/2019   COPD (chronic obstructive pulmonary  disease) ([place]) 07/07/2019   Exertional dyspnea 11/29/2018   Hx of CABG 11/29/2018   Coronary artery disease involving coronary bypass graft of native heart with angina pectoris ([place]) 08/14/2018   Angina pectoris ([place]) 07/24/2018   Pulmonary hypertension, unspecified ([place]) 07/21/2018   Status post mechanical aortic valve replacement 07/17/2018   H/O mitral valve replacement with mechanical valve 07/17/2018   Coronary artery disease of bypass graft of native heart with stable angina pectoris ([place]) 07/17/2018   Subtherapeutic international normalized ratio (INR) 07/17/2018   Atherosclerotic heart disease of native coronary artery with unspecified angina pectoris ([place]) 06/05/2018   Exertional chest pain 06/05/2018   Long term (current) use of anticoagulants 01/08/2017   PVCs (premature ventricular contractions) 11/17/2015   Aortic valve insufficiency    Mitral valve insufficiency    Mitral stenosis    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  Essential hypertension 06/29/2015   Murmur 06/29/2015   Atrial fibrillation with rapid ventricular response ([place]) 06/29/2015   Past Medical History:  Diagnosis Date   CKD stage 3 due to type 2 diabetes mellitus ([place]) 05/15/2021   COVID-19    Hyperlipidemia    Hypertension    Pain in both lower extremities 07/23/2019   Rheumatic heart disease    MS/ AS    Family History  Problem Relation Age of Onset   Cancer Mother    Hypertension Mother    Diabetes Mother    Hyperlipidemia Mother    Heart disease Mother    Cancer Father    Hypertension Father    Diabetes Father    Heart disease Father    Hypertension Sister    Hypertension Brother    Hypertension Brother    Schizophrenia Maternal Grandmother     Past Surgical History:  Procedure Laterality Date   CARDIAC CATHETERIZATION     CHOLECYSTECTOMY     CORONARY/GRAFT ANGIOGRAPHY N/A 08/18/2018   Procedure: CORONARY/GRAFT ANGIOGRAPHY;  Surgeon: Nigel Mormon, MD;  Location:  [facility];  Service: Cardiovascular;  Laterality: N/A;   LEG SURGERY     "metal plate in leg"   RIGHT HEART CATH AND CORONARY/GRAFT ANGIOGRAPHY N/A 07/21/2018   Procedure: RIGHT HEART CATH AND CORONARY/GRAFT ANGIOGRAPHY;  Surgeon: Nigel Mormon, MD;  Location: [facility];  Service: Cardiovascular;  Laterality: N/A;   TEE WITHOUT CARDIOVERSION N/A 07/27/2015   Procedure: TRANSESOPHAGEAL ECHOCARDIOGRAM (TEE);  Surgeon: Sanda Klein, MD;  Location: [facility];  Service: Cardiovascular;  Laterality: N/A;   TEE WITHOUT CARDIOVERSION N/A 07/21/2018   Procedure: TRANSESOPHAGEAL ECHOCARDIOGRAM (TEE);  Surgeon: Nigel Mormon, MD;  Location: [facility] ENDOSCOPY;  Service: Cardiovascular;  Laterality: N/A;   TONSILLECTOMY AND ADENOIDECTOMY     Social History   Occupational History   Not on file  Tobacco Use   Smoking status: Former    Packs/day: 1.50    Years: 40.00    Total pack years: 60.00    Types: Cigarettes    Quit date: 12/23/2016    Years since quitting: 5.2   Smokeless tobacco: Never  Vaping Use   Vaping Use: Never used  Substance and Sexual Activity   Alcohol use: Not Currently   Drug use: No   Sexual activity: Not Currently

## 2022-03-27 ENCOUNTER — Telehealth: Payer: Self-pay | Admitting: Nurse Practitioner

## 2022-03-27 NOTE — Telephone Encounter (Signed)
Faxed office notes to [facility]

## 2022-03-27 NOTE — Telephone Encounter (Signed)
Called and spoke with pt about her upcoming appt with our office. Pt said she wants to cancel appt for now due to all she has currently going on with her afib and would call us back later to get rescheduled. Nothing further needed.

## 2022-03-28 DIAGNOSIS — M545 Low back pain, unspecified: Secondary | ICD-10-CM | POA: Diagnosis not present

## 2022-03-28 DIAGNOSIS — M5136 Other intervertebral disc degeneration, lumbar region: Secondary | ICD-10-CM | POA: Diagnosis not present

## 2022-03-29 ENCOUNTER — Ambulatory Visit: Payer: Medicare Other | Admitting: Nurse Practitioner

## 2022-04-02 ENCOUNTER — Ambulatory Visit (HOSPITAL_COMMUNITY): Payer: 59 | Admitting: Anesthesiology

## 2022-04-02 ENCOUNTER — Ambulatory Visit (HOSPITAL_COMMUNITY)
Admission: RE | Admit: 2022-04-02 | Discharge: 2022-04-02 | Disposition: A | Discharge status: Home / Self Care | Payer: 59 | Attending: Cardiology | Admitting: Cardiology

## 2022-04-02 ENCOUNTER — Ambulatory Visit (HOSPITAL_BASED_OUTPATIENT_CLINIC_OR_DEPARTMENT_OTHER): Payer: 59 | Admitting: Anesthesiology

## 2022-04-02 ENCOUNTER — Other Ambulatory Visit: Payer: Self-pay

## 2022-04-02 ENCOUNTER — Encounter (HOSPITAL_COMMUNITY): Payer: Self-pay | Admitting: Cardiology

## 2022-04-02 ENCOUNTER — Encounter (HOSPITAL_COMMUNITY)
Admission: RE | Disposition: A | Discharge status: Home / Self Care | Payer: Self-pay | Source: Home / Self Care | Attending: Cardiology

## 2022-04-02 DIAGNOSIS — I272 Pulmonary hypertension, unspecified: Secondary | ICD-10-CM | POA: Diagnosis not present

## 2022-04-02 DIAGNOSIS — J449 Chronic obstructive pulmonary disease, unspecified: Secondary | ICD-10-CM | POA: Diagnosis not present

## 2022-04-02 DIAGNOSIS — I10 Essential (primary) hypertension: Secondary | ICD-10-CM

## 2022-04-02 DIAGNOSIS — I480 Paroxysmal atrial fibrillation: Secondary | ICD-10-CM | POA: Diagnosis not present

## 2022-04-02 DIAGNOSIS — M1990 Unspecified osteoarthritis, unspecified site: Secondary | ICD-10-CM | POA: Diagnosis not present

## 2022-04-02 DIAGNOSIS — Z952 Presence of prosthetic heart valve: Secondary | ICD-10-CM | POA: Diagnosis not present

## 2022-04-02 DIAGNOSIS — I2510 Atherosclerotic heart disease of native coronary artery without angina pectoris: Secondary | ICD-10-CM | POA: Diagnosis not present

## 2022-04-02 DIAGNOSIS — Z87891 Personal history of nicotine dependence: Secondary | ICD-10-CM | POA: Diagnosis not present

## 2022-04-02 DIAGNOSIS — E1122 Type 2 diabetes mellitus with diabetic chronic kidney disease: Secondary | ICD-10-CM | POA: Diagnosis not present

## 2022-04-02 DIAGNOSIS — I4891 Unspecified atrial fibrillation: Secondary | ICD-10-CM

## 2022-04-02 DIAGNOSIS — I129 Hypertensive chronic kidney disease with stage 1 through stage 4 chronic kidney disease, or unspecified chronic kidney disease: Secondary | ICD-10-CM | POA: Diagnosis not present

## 2022-04-02 DIAGNOSIS — N183 Chronic kidney disease, stage 3 unspecified: Secondary | ICD-10-CM | POA: Diagnosis not present

## 2022-04-02 DIAGNOSIS — Z951 Presence of aortocoronary bypass graft: Secondary | ICD-10-CM | POA: Diagnosis not present

## 2022-04-02 HISTORY — PX: CARDIOVERSION: SHX1299

## 2022-04-02 LAB — GLUCOSE, CAPILLARY: Glucose-Capillary: 130 mg/dL — ABNORMAL HIGH (ref 70–99)

## 2022-04-02 LAB — POCT I-STAT, CHEM 8
BUN: 23 mg/dL — ABNORMAL HIGH (ref 6–20)
Calcium, Ion: 1.14 mmol/L — ABNORMAL LOW (ref 1.15–1.40)
Chloride: 104 mmol/L (ref 98–111)
Creatinine, Ser: 1.5 mg/dL — ABNORMAL HIGH (ref 0.44–1.00)
Glucose, Bld: 144 mg/dL — ABNORMAL HIGH (ref 70–99)
HCT: 31 % — ABNORMAL LOW (ref 36.0–46.0)
Hemoglobin: 10.5 g/dL — ABNORMAL LOW (ref 12.0–15.0)
Potassium: 4 mmol/L (ref 3.5–5.1)
Sodium: 138 mmol/L (ref 135–145)
TCO2: 20 mmol/L — ABNORMAL LOW (ref 22–32)

## 2022-04-02 LAB — PROTIME-INR
INR: 3.2 — ABNORMAL HIGH (ref 0.8–1.2)
Prothrombin Time: 32.4 seconds — ABNORMAL HIGH (ref 11.4–15.2)

## 2022-04-02 SURGERY — CARDIOVERSION
Anesthesia: General

## 2022-04-02 MED ORDER — SODIUM CHLORIDE 0.9 % IV SOLN: INTRAVENOUS | Status: DC

## 2022-04-02 MED ORDER — LIDOCAINE 2% (20 MG/ML) 5 ML SYRINGE
INTRAMUSCULAR | Status: DC | PRN
  Administered 2022-04-02: 40 mg via INTRAVENOUS

## 2022-04-02 MED ORDER — PROPOFOL 10 MG/ML IV BOLUS
INTRAVENOUS | Status: DC | PRN
  Administered 2022-04-02: 60 mg via INTRAVENOUS

## 2022-04-02 NOTE — H&P (Signed)
Kimberly Hobbs is an 62 y.o. female.   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    Past Medical History:  Diagnosis Date   CKD stage 3 due to type 2 diabetes mellitus ([place]) 05/15/2021   COVID-19    Hyperlipidemia    Hypertension    Pain in both lower extremities 07/23/2019   Rheumatic heart disease    MS/ AS    Past Surgical History:  Procedure Laterality Date   CARDIAC CATHETERIZATION     CHOLECYSTECTOMY     CORONARY/GRAFT ANGIOGRAPHY N/A 08/18/2018   Procedure: CORONARY/GRAFT ANGIOGRAPHY;  Surgeon: Nigel Mormon, MD;  Location: [place] [facility];  Service: Cardiovascular;  Laterality: N/A;   LEG SURGERY     "metal plate in leg"   RIGHT HEART CATH AND CORONARY/GRAFT ANGIOGRAPHY N/A 07/21/2018   Procedure: RIGHT HEART CATH AND CORONARY/GRAFT ANGIOGRAPHY;  Surgeon: Nigel Mormon, MD;  Location: [place] [facility];  Service: Cardiovascular;  Laterality: N/A;   TEE WITHOUT CARDIOVERSION N/A 07/27/2015   Procedure: TRANSESOPHAGEAL ECHOCARDIOGRAM (TEE);  Surgeon: Sanda Klein, MD;  Location: [facility] ENDOSCOPY;  Service: Cardiovascular;  Laterality: N/A;   TEE WITHOUT CARDIOVERSION N/A 07/21/2018   Procedure: TRANSESOPHAGEAL ECHOCARDIOGRAM (TEE);  Surgeon: Nigel Mormon, MD;  Location: [facility] ENDOSCOPY;  Service: Cardiovascular;  Laterality: N/A;   TONSILLECTOMY AND ADENOIDECTOMY       Family History  Problem Relation Age of Onset   Cancer Mother    Hypertension Mother    Diabetes Mother    Hyperlipidemia Mother    Heart disease Mother    Cancer Father    Hypertension Father    Diabetes Father    Heart disease Father    Hypertension Sister    Hypertension Brother    Hypertension Brother    Schizophrenia Maternal  Grandmother     Social History:  reports that she quit smoking about 5 years ago. Her smoking use included cigarettes. She has a 60.00 pack-year smoking history. She has never used smokeless tobacco. She reports that she does not currently use alcohol. She reports that she does not use drugs.  Allergies:  Allergies  Allergen Reactions   Iodinated Contrast Media Other (See Comments)    Patient is unsure of reaction type   Penicillins Other (See Comments)    Immune to drug , does not work per patient  Did it involve swelling of the face/tongue/throat, SOB, or low BP? No Did it involve sudden or severe rash/hives, skin peeling, or any reaction on the inside of your mouth or nose? No Did you need to seek medical attention at a hospital or doctor's office? No When did it last happen? NOT A TRUE ALLERGY   If all above answers are "NO", may proceed with cephalosporin use.     ROS   Blood pressure 136/84, pulse (!) 120, resp. rate (!) 21, height 5' 5"$  (1.651 m), weight 99.8 kg, last menstrual period 06/20/2015, SpO2 93 %. Body mass index is 36.61 kg/m.   Physical Exam   Medications Prior to Admission  Medication Sig Dispense Refill   acetaminophen (TYLENOL) 500 MG tablet Take 1,000 mg by mouth every 6 (six) hours as needed (for pain.).     amiodarone (PACERONE) 200 MG tablet Take 1 tablet (200 mg total) by mouth  daily. Take 1 pill twice a day 03/07/2022-03/10/2022 (Patient taking differently: Take 200 mg by mouth 2 (two) times daily.) 1 tablet 0   aspirin EC 81 MG tablet Take 81 mg by mouth daily.      azelastine (ASTELIN) 0.1 % nasal spray Place 1 spray into both nostrils 2 (two) times daily. Use in each nostril as directed 30 mL 5   cetirizine (ZYRTEC) 10 MG tablet Take 1 tablet (10 mg total) by mouth daily. 30 tablet 11   citalopram (CELEXA) 40 MG tablet Take 0.5 tablets (20 mg total) by mouth 2 (two) times daily. 90 tablet 3   clonazePAM (KLONOPIN) 0.5 MG tablet TAKE 1 AND 1/2  TABLETS(0.75 MG) BY MOUTH AT BEDTIME 45 tablet 2   diltiazem (CARDIZEM CD) 180 MG 24 hr capsule TAKE 1 CAPSULE BY MOUTH DAILY 90 capsule 2   furosemide (LASIX) 20 MG tablet TAKE 1 TABLET BY MOUTH DAILY 90 tablet 2   HYDROcodone-acetaminophen (NORCO/VICODIN) 5-325 MG tablet Take 1 tablet by mouth every 6 (six) hours as needed for moderate pain.     isosorbide mononitrate (IMDUR) 60 MG 24 hr tablet TAKE 1 TABLET(60 MG) BY MOUTH DAILY 90 tablet 3   levalbuterol (XOPENEX HFA) 45 MCG/ACT inhaler Inhale 1-2 puffs into the lungs every 6 (six) hours as needed for shortness of breath. 1 each 12   levalbuterol (XOPENEX) 0.63 MG/3ML nebulizer solution Take 3 mLs (0.63 mg total) by nebulization every 4 (four) hours as needed for wheezing or shortness of breath. 3 mL 12   losartan (COZAAR) 50 MG tablet Take 1 tablet (50 mg total) by mouth daily. 90 tablet 3   metFORMIN (GLUCOPHAGE) 500 MG tablet Take 1 tablet (500 mg total) by mouth 2 (two) times daily with a meal. 180 tablet 3   methocarbamol (ROBAXIN) 500 MG tablet Take 1 tablet (500 mg total) by mouth every 8 (eight) hours as needed for muscle spasms. (Patient taking differently: Take 500 mg by mouth 2 (two) times daily.) 40 tablet 0   metoprolol succinate (TOPROL-XL) 25 MG 24 hr tablet Take 1 tablet (25 mg total) by mouth daily. Take with or immediately following a meal. 90 tablet 3   pantoprazole (PROTONIX) 40 MG tablet Take 1 tablet (40 mg total) by mouth 2 (two) times daily before a meal. (Patient taking differently: Take 40 mg by mouth daily.) 180 tablet 3   rosuvastatin (CRESTOR) 40 MG tablet Take 1 tablet (40 mg total) by mouth daily. 90 tablet 1   spironolactone (ALDACTONE) 50 MG tablet TAKE 1 TABLET(50 MG) BY MOUTH DAILY 30 tablet 6   Tiotropium Bromide-Olodaterol (STIOLTO RESPIMAT) 2.5-2.5 MCG/ACT AERS Inhale 2 puffs into the lungs daily. 4 g 0   warfarin (COUMADIN) 5 MG tablet TAKE 1 TABLET BY MOUTH DAILY (Patient taking differently: Take 5 mg by  mouth 3 (three) times a week. Wed, Thurs, and Sat) 90 tablet 2   warfarin (COUMADIN) 7.5 MG tablet TAKE 1 TABLET BY MOUTH EVERY EVENING (Patient taking differently: Take 7.5 mg by mouth 4 (four) times a week. Sun, Mon, Tues, and Fri) 90 tablet 2   BD PEN NEEDLE NANO 2ND GEN 32G X 4 MM MISC USE 1 PEN NEEDLE TWICE DAILY BETWEEN MEALS     Lancets (ONETOUCH DELICA PLUS Q000111Q) MISC TAKE AS DIRECTED ONCE DAILY.     nitroGLYCERIN (NITROSTAT) 0.4 MG SL tablet Place 1 tablet (0.4 mg total) under the tongue every 5 (five) minutes x 3 doses as needed for chest  pain. 25 tablet 3   ONETOUCH VERIO test strip AS DIRECTED ONCE A DAY        Current Facility-Administered Medications:    0.9 %  sodium chloride infusion, , Intravenous, Continuous, Kewanda Poland J, MD, Last Rate: 20 mL/hr at 04/02/22 0715, New Bag at 04/02/22 0715   Today's Vitals   04/02/22 0650  BP: 136/84  Pulse: (!) 120  Resp: (!) 21  SpO2: 93%  Weight: 99.8 kg  Height: 5' 5"$  (1.651 m)  PainSc: 0-No pain   Body mass index is 36.61 kg/m.   Lab Results: Reviewed and interpreted:    Tests ordered:  Lab Orders         Protime-INR         Glucose, capillary         I-STAT, chem 8       Cardiac Studies:  EKG 03/07/2022: Atrial fibrillation 107 bpm IVCD Anteroseptal infarct -age undetermined Nonspecific ST depression -Nondiagnostic   Coronary and bypass graft angiography 07/21/2018 and 08/18/2018: LM: Normal LAD: 100% mid occlusion. Mild distal disease LIMA-LAD: Patent LCx: Normal RCA: Prox 60% stenosis, mid 30-40% stenoses. TIMI III flow in prox-mid RCA. 100% distal occlusion SVG-RCA: Patent. Ostial 40%. Distal RCA moderate diffuse disease.    Guide catheter angiography provided superior images. There is TIMI III flow in SVG which fills RCA all the way to the ostium. Even if she were to have FFR positive lesion in the graft, I do not think the benefits of a stent would outweigh the risk of losing the graft  and the entire RCA territory circulation in a borderline lesion. Also, the prox RCA lesion is well bypassed by the SVG graft. Thus, I decided not to perform FFR/PCI to either of these lesions. Continue medical management.    [place] 07/21/2018: #1: RA: 18 mmHg RV 90/12 mmHg PA: 94/40 mmHg. Mean PA 63 mmHg. PW 33 mmHg  Lasix 80 mg  #2: RA 19 mmHg RV 60/0 mmHg PA: 56/16 mmHg. Mean PA 36 mmHg PW: 25 mmHg  CO: 5.4 L/min. CI 2.6 L/min/m2 PVR 2 WU  Impression: Elevated filling pressures Moderate WHO Grp II pulmonary hypertension Improvement in filling pressures with diuresis.   Echocardiogram 02/26/2018: Left ventricle cavity is normal in size. Mild concentric hypertrophy of the left ventricle. Abnormal septal wall motion due to post-operative valve. Diastolic function could not be assessed due to post op valve and CABG status.  Calculated EF 63%. Left atrial cavity is moderate to severely dilated measures 4.5 cm in long axis. Right atrial cavity is mildly dilated. Mechanical aortic valve with trace regurgitation. Mild aortic valve leaflet calcification. Mildly restricted aortic valve leaflets. Mild to moderate aortic valve stenosis. Aortic valve peak pressure gradient of  43 and mean gradient of 21 mmHg, calculated aortic valve area    0.88 cm. Mechanical mitral valve with trace regurgitation. Moderately restricted mitral valve leaflets. Mild mitral valve stenosis. Mitral valve peak pressure gradient of  26  and mean gradient of  6.2  mmHg, calculated mitral valve area 1.9   cm. Mild to moderate tricuspid regurgitation. Mild pulmonary hypertension. PA systolic pressure estimated at 39 mm Hg. Compared to the study done on 08/06/2017, no significant change.   TEE 07/21/2018:  1. The left ventricle has normal systolic function, with an ejection fraction of 55-60%. There is abnormal septal motion consistent with post-operative status.  2. The right ventricle has normal systolc function.  3. A  25 mm Regent mechanical valve is present  in the mitral position. Procedure Date: 2018 Echo findings are consistent with is functioning normally the mitral prosthesis. Normal mitral valve prosthesis. Mean PG 6 mmHg, MVA 1.7 cm2. No thrombus seen.  (Patient known to have subtherapeutic INR).  4. A 43m St. Jude mechanical prosthesis valve is present in the aortic position. Procedure Date: 2018 Normal aortic valve prosthesis. Echo findings shows no evidence of Accelration time of 88 msec suggests normal functioning valve. Mean PG 25 mmHg  likely due to small annular diameter. No prosthetic valve stenosis noted. of the aortic prosthesis. No thrombus seen. (Patient known to have subtherapeutic INR).   Recent labs: 12/24/2021: Glucose 96, BUN/Cr 22/1.26. EGFR 46. Na/K 137/5.1.  HbA1C 6.3%   07/2021: H/H 10.8/34. MCV 80. 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   Plan for cardioversion    MNigel Mormon MD Pager: [phone]: [phone]

## 2022-04-02 NOTE — Anesthesia Procedure Notes (Signed)
Procedure Name: General with mask airway Date/Time: 04/02/2022 7:51 AM  Performed by: Kyung Rudd, CRNAPre-anesthesia Checklist: Patient identified, Emergency Drugs available, Suction available and Patient being monitored Patient Re-evaluated:Patient Re-evaluated prior to induction Oxygen Delivery Method: Ambu bag Preoxygenation: Pre-oxygenation with 100% oxygen Induction Type: IV induction Ventilation: Mask ventilation without difficulty

## 2022-04-02 NOTE — CV Procedure (Signed)
Direct current cardioversion:  Indication symptomatic: Symptomatic atrial fibrillation  Procedure: Under deep sedation administered and monitored by anesthesiology, synchronized direct current cardioversion performed. Patient was delivered with 1 Joules of electricity X 120 with success to NSR. Patient tolerated the procedure well. No immediate complication noted.   Nigel Mormon, MD [facility] Pager: [phone] Office: [phone] If no answer Cell [phone]

## 2022-04-02 NOTE — Transfer of Care (Signed)
Immediate Anesthesia Transfer of Care Note  Patient: Kimberly Hobbs  Procedure(s) Performed: CARDIOVERSION  Patient Location: Endoscopy Unit  Anesthesia Type:MAC and General  Level of Consciousness: awake, alert , and oriented  Airway & Oxygen Therapy: Patient Spontanous Breathing  Post-op Assessment: Report given to RN, Post -op Vital signs reviewed and stable, and Patient moving all extremities X 4  Post vital signs: Reviewed and stable  Last Vitals:  Vitals Value Taken Time  BP    Temp    Pulse 64 04/02/22 0758  Resp 22 04/02/22 0758  SpO2 88 % 04/02/22 0758  Vitals shown include unvalidated device data.  Last Pain:  Vitals:   04/02/22 0650  PainSc: 0-No pain         Complications: No notable events documented.

## 2022-04-02 NOTE — Anesthesia Preprocedure Evaluation (Addendum)
Anesthesia Evaluation  Patient identified by MRN, date of birth, ID band Patient awake    Reviewed: Allergy & Precautions, NPO status , Patient's Chart, lab work & pertinent test results  Airway Mallampati: II  TM Distance: >3 FB Neck ROM: Full    Dental  (+) Teeth Intact   Pulmonary COPD, former smoker   breath sounds clear to auscultation       Cardiovascular hypertension, + CAD  + dysrhythmias Atrial Fibrillation + Valvular Problems/Murmurs AS  Rhythm:Irregular Rate:Normal  - s/p AVR  Echo: Echocardiogram 06/01/2020: Normal LV systolic function with EF 55%. Left ventricle cavity is normal in size. Normal global wall motion. Calculated EF 55%. Left atrial cavity is moderately dilated. Well seated mechanical aortic valve.  mean PG 22 mmHg with acceleration time <100 msec. Above findings similar to previous TTE and TEE in 2020, and suggest patient prosthesis mismatch in 19 mm St Jude valve, rather than prosthetic valve stenosis. DVI of 0.18 is likely erroneous due improper LV velocity measurement.  Mild (Grade I) aortic regurgitation. Well seated, well functioning 25 mm Regent mechanical mitral valve. Mean PG 3 mmHg, which is normal. Trace mitral regurgitation. Estimated PASP 31 mmHg.     Neuro/Psych  PSYCHIATRIC DISORDERS Anxiety        GI/Hepatic   Endo/Other  diabetes    Renal/GU      Musculoskeletal  (+) Arthritis ,    Abdominal   Peds  Hematology   Anesthesia Other Findings   Reproductive/Obstetrics                             Anesthesia Physical Anesthesia Plan  ASA: 3  Anesthesia Plan: General   Post-op Pain Management: Minimal or no pain anticipated   Induction: Intravenous  PONV Risk Score and Plan: Propofol infusion  Airway Management Planned: Natural Airway and Simple Face Mask  Additional Equipment: None  Intra-op Plan:   Post-operative Plan:   Informed  Consent: I have reviewed the patients History and Physical, chart, labs and discussed the procedure including the risks, benefits and alternatives for the proposed anesthesia with the patient or authorized representative who has indicated his/her understanding and acceptance.       Plan Discussed with: CRNA  Anesthesia Plan Comments:        Anesthesia Quick Evaluation

## 2022-04-02 NOTE — Discharge Instructions (Signed)

## 2022-04-02 NOTE — Interval H&P Note (Signed)
History and Physical Interval Note:  04/02/2022 7:49 AM  Kimberly Hobbs  has presented today for surgery, with the diagnosis of AFIB.  The various methods of treatment have been discussed with the patient and family. After consideration of risks, benefits and other options for treatment, the patient has consented to  Procedure(s): CARDIOVERSION (N/A) as a surgical intervention.  The patient's history has been reviewed, patient examined, no change in status, stable for surgery.  I have reviewed the patient's chart and labs.  Questions were answered to the patient's satisfaction.     [place]

## 2022-04-02 NOTE — Anesthesia Postprocedure Evaluation (Signed)
Anesthesia Post Note  Patient: Kimberly Hobbs  Procedure(s) Performed: CARDIOVERSION     Patient location during evaluation: PACU Anesthesia Type: General Level of consciousness: awake and alert Pain management: pain level controlled Vital Signs Assessment: post-procedure vital signs reviewed and stable Respiratory status: spontaneous breathing, nonlabored ventilation, respiratory function stable and patient connected to nasal cannula oxygen Cardiovascular status: blood pressure returned to baseline and stable Postop Assessment: no apparent nausea or vomiting Anesthetic complications: no  No notable events documented.  Last Vitals:  Vitals:   04/02/22 0820 04/02/22 0832  BP: (!) 108/42 110/60  Pulse: 64 65  Resp: (!) 22 12  Temp:    SpO2: 91% 95%    Last Pain:  Vitals:   04/02/22 0832  TempSrc:   PainSc: 0-No pain                 Effie Berkshire

## 2022-04-03 ENCOUNTER — Ambulatory Visit: Payer: Medicare Other | Admitting: Cardiology

## 2022-04-03 ENCOUNTER — Encounter (HOSPITAL_COMMUNITY): Payer: Self-pay | Admitting: Cardiology

## 2022-04-04 ENCOUNTER — Ambulatory Visit: Payer: 59 | Admitting: Cardiology

## 2022-04-04 ENCOUNTER — Encounter: Payer: Self-pay | Admitting: Cardiology

## 2022-04-04 VITALS — BP 124/56 | HR 74 | Resp 16 | Ht 65.0 in | Wt 223.0 lb

## 2022-04-04 DIAGNOSIS — Z952 Presence of prosthetic heart valve: Secondary | ICD-10-CM

## 2022-04-04 DIAGNOSIS — I25118 Atherosclerotic heart disease of native coronary artery with other forms of angina pectoris: Secondary | ICD-10-CM | POA: Diagnosis not present

## 2022-04-04 DIAGNOSIS — I480 Paroxysmal atrial fibrillation: Secondary | ICD-10-CM

## 2022-04-04 DIAGNOSIS — R0609 Other forms of dyspnea: Secondary | ICD-10-CM

## 2022-04-04 MED ORDER — FUROSEMIDE 20 MG PO TABS: 20.0000 mg | ORAL_TABLET | Freq: Two times a day (BID) | ORAL | 2 refills | Status: DC

## 2022-04-04 MED ORDER — AMIODARONE HCL 200 MG PO TABS: 200.0000 mg | ORAL_TABLET | Freq: Every day | ORAL | 0 refills | Status: DC

## 2022-04-04 NOTE — Progress Notes (Signed)
Subjective:   Kimberly Hobbs, female    DOB: 06-04-1960, 62 y.o.   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  Patient underwent cardioversion on 04/02/2022. She has felt constant chest pressure, and shortness of breath. She is concerned about hypoxia (86% pO2) noted on her home pulse oximeter at rest.    Current Outpatient Medications:    acetaminophen (TYLENOL) 500 MG tablet, Take 1,000 mg by mouth every 6 (six) hours as needed (for pain.)., Disp: , Rfl:    amiodarone (PACERONE) 200 MG tablet, Take 1 tablet (200 mg total) by mouth daily. Take 1 pill twice a day 03/07/2022-03/10/2022 (Patient taking differently: Take 200 mg by mouth 2 (two) times daily.), Disp: 1 tablet, Rfl: 0   aspirin EC 81 MG tablet, Take 81 mg by mouth daily. , Disp: , Rfl:    azelastine (ASTELIN) 0.1 % nasal spray, Place 1 spray into both nostrils 2 (two) times daily. Use in each nostril as directed, Disp: 30 mL, Rfl: 5   BD PEN NEEDLE NANO 2ND GEN 32G X 4 MM MISC, USE 1 PEN NEEDLE TWICE DAILY BETWEEN MEALS, Disp: , Rfl:    cetirizine (ZYRTEC) 10 MG tablet, Take 1 tablet (10 mg total) by mouth daily., Disp: 30 tablet, Rfl: 11   citalopram (CELEXA) 40 MG tablet, Take 0.5 tablets (20 mg total) by mouth 2 (two) times daily., Disp: 90 tablet, Rfl: 3   clonazePAM (KLONOPIN) 0.5 MG tablet, TAKE 1 AND 1/2 TABLETS(0.75 MG) BY MOUTH AT BEDTIME, Disp: 45 tablet, Rfl: 2   diltiazem (CARDIZEM CD) 180 MG 24 hr capsule, TAKE 1 CAPSULE BY MOUTH DAILY, Disp: 90 capsule, Rfl: 2   furosemide (LASIX) 20 MG tablet, TAKE 1 TABLET BY MOUTH DAILY, Disp: 90 tablet, Rfl: 2   HYDROcodone-acetaminophen (NORCO/VICODIN) 5-325 MG tablet, Take 1 tablet by mouth every 6 (six) hours  as needed for moderate pain., Disp: , Rfl:    isosorbide mononitrate (IMDUR) 60 MG 24 hr tablet, TAKE 1 TABLET(60 MG) BY MOUTH DAILY, Disp: 90 tablet, Rfl: 3   Lancets (ONETOUCH DELICA PLUS Q000111Q) MISC, TAKE AS DIRECTED ONCE DAILY., Disp: , Rfl:    levalbuterol (XOPENEX HFA) 45 MCG/ACT inhaler, Inhale 1-2 puffs into the lungs every 6 (six) hours as needed for shortness of breath., Disp: 1 each, Rfl: 12   levalbuterol (XOPENEX) 0.63 MG/3ML nebulizer solution, Take 3 mLs (0.63 mg total) by nebulization every 4 (four) hours as needed for wheezing or shortness of breath., Disp: 3 mL, Rfl: 12   losartan (COZAAR) 50 MG tablet, Take 1 tablet (50 mg total) by mouth daily., Disp: 90 tablet, Rfl: 3   metFORMIN (GLUCOPHAGE) 500 MG tablet, Take 1 tablet (500 mg total) by mouth 2 (two) times daily with a meal., Disp: 180 tablet, Rfl: 3   methocarbamol (ROBAXIN) 500 MG tablet, Take 1 tablet (500 mg total) by mouth every 8 (eight) hours as needed for muscle spasms. (Patient taking differently: Take 500 mg by mouth 2 (two) times daily.), Disp: 40 tablet, Rfl: 0   metoprolol succinate (TOPROL-XL) 25 MG 24 hr tablet, Take 1 tablet (25 mg total) by mouth daily. Take with or immediately following a meal., Disp: 90 tablet, Rfl: 3   nitroGLYCERIN (NITROSTAT) 0.4 MG SL tablet,  Place 1 tablet (0.4 mg total) under the tongue every 5 (five) minutes x 3 doses as needed for chest pain., Disp: 25 tablet, Rfl: 3   ONETOUCH VERIO test strip, AS DIRECTED ONCE A DAY, Disp: , Rfl:    pantoprazole (PROTONIX) 40 MG tablet, Take 1 tablet (40 mg total) by mouth 2 (two) times daily before a meal. (Patient taking differently: Take 40 mg by mouth daily.), Disp: 180 tablet, Rfl: 3   rosuvastatin (CRESTOR) 40 MG tablet, Take 1 tablet (40 mg total) by mouth daily., Disp: 90 tablet, Rfl: 1   spironolactone (ALDACTONE) 50 MG tablet, TAKE 1 TABLET(50 MG) BY MOUTH DAILY, Disp: 30 tablet, Rfl: 6   Tiotropium Bromide-Olodaterol (STIOLTO RESPIMAT)  2.5-2.5 MCG/ACT AERS, Inhale 2 puffs into the lungs daily., Disp: 4 g, Rfl: 0   warfarin (COUMADIN) 5 MG tablet, TAKE 1 TABLET BY MOUTH DAILY (Patient taking differently: Take 5 mg by mouth 3 (three) times a week. Wed, Thurs, and Sat), Disp: 90 tablet, Rfl: 2   warfarin (COUMADIN) 7.5 MG tablet, TAKE 1 TABLET BY MOUTH EVERY EVENING (Patient taking differently: Take 7.5 mg by mouth 4 (four) times a week. Sun, Mon, Tues, and Fri), Disp: 90 tablet, Rfl: 2  Cardiovascular studies:  EKG 04/04/2022: Sinus rhythm 77 bpm Occasional PAC   Left axis -anterior fascicular block   Cardioversion 04/02/2022  EKG 03/07/2022: Atrial fibrillation 107 bpm IVCD Anteroseptal infarct -age undetermined Nonspecific ST depression -Nondiagnostic  Coronary and bypass graft angiography 07/21/2018 and 08/18/2018: LM: Normal LAD: 100% mid occlusion. Mild distal disease LIMA-LAD: Patent LCx: Normal RCA: Prox 60% stenosis, mid 30-40% stenoses. TIMI III flow in prox-mid RCA. 100% distal occlusion SVG-RCA: Patent. Ostial 40%. Distal RCA moderate diffuse disease.    Guide catheter angiography provided superior images. There is TIMI III flow in SVG which fills RCA all the way to the ostium. Even if she were to have FFR positive lesion in the graft, I do not think the benefits of a stent would outweigh the risk of losing the graft and the entire RCA territory circulation in a borderline lesion. Also, the prox RCA lesion is well bypassed by the SVG graft. Thus, I decided not to perform FFR/PCI to either of these lesions. Continue medical management.   [facility] 07/21/2018: #1: RA: 18 mmHg RV 90/12 mmHg PA: 94/40 mmHg. Mean PA 63 mmHg. PW 33 mmHg  Lasix 80 mg  #2: RA 19 mmHg RV 60/0 mmHg PA: 56/16 mmHg. Mean PA 36 mmHg PW: 25 mmHg  CO: 5.4 L/min. CI 2.6 L/min/m2 PVR 2 WU  Impression: Elevated filling pressures Moderate WHO Grp II pulmonary hypertension Improvement in filling pressures with diuresis.    Echocardiogram 02/26/2018: Left ventricle cavity is normal in size. Mild concentric hypertrophy of the left ventricle. Abnormal septal wall motion due to post-operative valve. Diastolic function could not be assessed due to post op valve and CABG status.  Calculated EF 63%. Left atrial cavity is moderate to severely dilated measures 4.5 cm in long axis. Right atrial cavity is mildly dilated. Mechanical aortic valve with trace regurgitation. Mild aortic valve leaflet calcification. Mildly restricted aortic valve leaflets. Mild to moderate aortic valve stenosis. Aortic valve peak pressure gradient of  43 and mean gradient of 21 mmHg, calculated aortic valve area    0.88 cm. Mechanical mitral valve with trace regurgitation. Moderately restricted mitral valve leaflets. Mild mitral valve stenosis. Mitral valve peak pressure gradient of  26  and mean gradient of  6.2  mmHg,  calculated mitral valve area 1.9   cm. Mild to moderate tricuspid regurgitation. Mild pulmonary hypertension. PA systolic pressure estimated at 39 mm Hg. Compared to the study done on 08/06/2017, no significant change.  TEE 07/21/2018:  1. The left ventricle has normal systolic function, with an ejection fraction of 55-60%. There is abnormal septal motion consistent with post-operative status.  2. The right ventricle has normal systolc function.  3. A 25 mm Regent mechanical valve is present in the mitral position. Procedure Date: 2018 Echo findings are consistent with is functioning normally the mitral prosthesis. Normal mitral valve prosthesis. Mean PG 6 mmHg, MVA 1.7 cm2. No thrombus seen.  (Patient known to have subtherapeutic INR).  4. A 40m [employer] mechanical prosthesis valve is present in the aortic position. Procedure Date: 2018 Normal aortic valve prosthesis. Echo findings shows no evidence of Accelration time of 88 msec suggests normal functioning valve. Mean PG 25 mmHg  likely due to small annular diameter. No prosthetic  valve stenosis noted. of the aortic prosthesis. No thrombus seen. (Patient known to have subtherapeutic INR).  Recent labs: 12/24/2021: Glucose 96, BUN/Cr 22/1.26. EGFR 46. Na/K 137/5.1.  HbA1C 6.3%  07/2021: H/H 10.8/34. MCV 80. Platelets 240  04/2021: Chol 116, TG 115, HDL 45, LDL 48  06/2020: TSH 2.8 normal  02/16/2018: BNP 251 elevated   Review of Systems  Cardiovascular:  Positive for chest pain and dyspnea on exertion. Negative for leg swelling, palpitations and syncope.  Musculoskeletal:  Positive for joint pain.       Right forearm/elbow pain  Neurological:  Positive for dizziness.         Vitals:   04/04/22 1034  BP: (!) 124/56  Pulse: 74  Resp: 16  SpO2: 100%     Objective:     Physical Exam Vitals and nursing note reviewed.  Constitutional:      General: She is not in acute distress. Neck:     Vascular: No JVD.  Cardiovascular:     Rate and Rhythm: Normal rate and regular rhythm.     Heart sounds: No murmur heard.    Comments: Metallic S1, S2 Pulmonary:     Effort: Pulmonary effort is normal.     Breath sounds: Normal breath sounds. No wheezing or rales.  Musculoskeletal:     Right lower leg: No edema.     Left lower leg: No edema.          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  Paroxysmal Afib: In sinus rhythm s/p cardioversion 04/02/2022. Reduce amiodarone to 200 mg daily. Continue metoprolol succinate 25 mg daily, diltiazem 180 mg daily. High CHA2DS2VAsc score. Continue warfarin.  Recent constant chest pressure, without any ST-T abnormality, probably due to mild contusion post cardioversion. She is not hypoxic at rest today. I suspect her home O2 check was erroneous. Regardless, I will get  chest Xray and increase lasix to 20 mg bid.   CAD of native and bypass grafts without angina: Patent grafts with non-critical disease.(07/2018). Continue Aspirin, statin.  S/p MVR, AVR: Continue warfarin  Hypertension: Controlled.  F/u in 2 weeks    MNigel Mormon MD Pager: [phone]: [phone]

## 2022-04-04 NOTE — Telephone Encounter (Signed)
From patient.  She says this began the morning of her cardioversion. Pt states she has felt this way before, and it resulted to fluid around her heart. She says she was up all night because she was concerned about her O2 level. She feels SOB when she walks but resolves when she is at rest. Chest pain is described as feeling like something is in her lung. Pain does not radiate, and is a 3 out of 10. Scheduled patient for an appt with you this morning.

## 2022-04-05 ENCOUNTER — Ambulatory Visit: Payer: 59

## 2022-04-05 DIAGNOSIS — Z952 Presence of prosthetic heart valve: Secondary | ICD-10-CM | POA: Diagnosis not present

## 2022-04-05 DIAGNOSIS — I480 Paroxysmal atrial fibrillation: Secondary | ICD-10-CM

## 2022-04-05 DIAGNOSIS — I25118 Atherosclerotic heart disease of native coronary artery with other forms of angina pectoris: Secondary | ICD-10-CM | POA: Diagnosis not present

## 2022-04-07 ENCOUNTER — Encounter: Payer: Self-pay | Admitting: Cardiology

## 2022-04-08 ENCOUNTER — Other Ambulatory Visit: Payer: Self-pay | Admitting: Cardiology

## 2022-04-08 ENCOUNTER — Telehealth: Payer: Self-pay

## 2022-04-08 ENCOUNTER — Encounter: Payer: Self-pay | Admitting: Nurse Practitioner

## 2022-04-08 DIAGNOSIS — I480 Paroxysmal atrial fibrillation: Secondary | ICD-10-CM

## 2022-04-08 NOTE — Telephone Encounter (Signed)
From patient

## 2022-04-08 NOTE — Telephone Encounter (Signed)
Increase amiodarone to 200 mg three times a day, and set up for cardioversion again. I also placed a referral to EP>  Thanks MJP

## 2022-04-08 NOTE — Telephone Encounter (Signed)
Patient called to inform us that she has been in A-fib and her pulse the highest has been 152 this morning. Per Dr. Virgina Jock patient should start taking amiodarone (PACERONE) 200 MG tablet TID and then we will schedule a Cardioversion. Patient understood.

## 2022-04-09 NOTE — Telephone Encounter (Signed)
Pt called today to check on her my chart message she sent yesterday. She stated that she decided to use some preparation H and would like to know if that is correct.

## 2022-04-11 ENCOUNTER — Encounter: Payer: Self-pay | Admitting: Cardiology

## 2022-04-11 NOTE — Telephone Encounter (Signed)
From patient

## 2022-04-11 NOTE — Telephone Encounter (Signed)
Please cancel cardioversion

## 2022-04-15 ENCOUNTER — Other Ambulatory Visit: Payer: Self-pay | Admitting: Cardiology

## 2022-04-15 ENCOUNTER — Telehealth: Payer: Self-pay

## 2022-04-15 ENCOUNTER — Other Ambulatory Visit: Payer: Self-pay

## 2022-04-15 DIAGNOSIS — J449 Chronic obstructive pulmonary disease, unspecified: Secondary | ICD-10-CM | POA: Diagnosis not present

## 2022-04-15 DIAGNOSIS — J310 Chronic rhinitis: Secondary | ICD-10-CM | POA: Diagnosis not present

## 2022-04-15 MED ORDER — AMIODARONE HCL 200 MG PO TABS: 200.0000 mg | ORAL_TABLET | Freq: Every day | ORAL | 0 refills | Status: DC

## 2022-04-15 NOTE — Telephone Encounter (Signed)
Patient called in regards to her Amiodarone 200 mg. Patient mention that her pharmacy will not refill due that it is too early. I called the pharmacy to see what was going on.  Pharmacy mention that she got a 90 days in January. Inform the pharmacy that she was taking Amiodarone 200 mg TID. The pharmacy mention due that we did not send in a new prescription for TID and now she will need to wait until march 15.   Called patient to inform her that she will need to pay out of pock it which is 28 dollars and some change. Patient was upset and will try to pay for the Amiodarone 200 mg.

## 2022-04-15 NOTE — Telephone Encounter (Signed)
INR high likely due to ongoing use of amiodarone. Please call in amio 200 daily (please let her know I'm reducing dose). Hold warfarin today. Resume tomorrow at current dose. Please let us know about INR in Wednesday.  04/15/22

## 2022-04-15 NOTE — Telephone Encounter (Signed)
From patient

## 2022-04-15 NOTE — Telephone Encounter (Signed)
Please call pharmacy if there is any issue with the prescription

## 2022-04-17 ENCOUNTER — Ambulatory Visit: Payer: Medicare Other | Admitting: Cardiology

## 2022-04-17 ENCOUNTER — Ambulatory Visit: Payer: 59 | Admitting: Cardiology

## 2022-04-18 DIAGNOSIS — H04123 Dry eye syndrome of bilateral lacrimal glands: Secondary | ICD-10-CM | POA: Diagnosis not present

## 2022-04-19 ENCOUNTER — Telehealth: Payer: Self-pay

## 2022-04-19 ENCOUNTER — Ambulatory Visit
Admission: RE | Admit: 2022-04-19 | Discharge: 2022-04-19 | Disposition: A | Discharge status: Home / Self Care | Payer: 59 | Source: Ambulatory Visit | Attending: Cardiology | Admitting: Cardiology

## 2022-04-19 DIAGNOSIS — R0609 Other forms of dyspnea: Secondary | ICD-10-CM

## 2022-04-19 DIAGNOSIS — R0602 Shortness of breath: Secondary | ICD-10-CM | POA: Diagnosis not present

## 2022-04-19 DIAGNOSIS — I480 Paroxysmal atrial fibrillation: Secondary | ICD-10-CM

## 2022-04-19 NOTE — Telephone Encounter (Incomplete)
Pa 

## 2022-04-22 ENCOUNTER — Other Ambulatory Visit: Payer: Self-pay | Admitting: Cardiology

## 2022-04-22 MED ORDER — AMIODARONE HCL 200 MG PO TABS: 200.0000 mg | ORAL_TABLET | ORAL | 3 refills | Status: DC

## 2022-04-22 NOTE — Telephone Encounter (Signed)
From patient

## 2022-04-22 NOTE — Telephone Encounter (Signed)
Reviewed chest Xray s/o congestion. Spoke with the patient. She knows to take lasix 20 mg 2 pills bid till f/u w/me on 3/7. Also knows to take amiodarone 200 mg bid for 3 days if in Afib, otherwise take 1 pill daily.  Nigel Mormon, MD

## 2022-04-23 ENCOUNTER — Ambulatory Visit (INDEPENDENT_AMBULATORY_CARE_PROVIDER_SITE_OTHER): Payer: 59 | Admitting: Internal Medicine

## 2022-04-23 ENCOUNTER — Encounter: Payer: Self-pay | Admitting: Internal Medicine

## 2022-04-23 VITALS — BP 120/76 | HR 104 | Temp 98.7°F | Ht 65.0 in | Wt 218.8 lb

## 2022-04-23 DIAGNOSIS — I5043 Acute on chronic combined systolic (congestive) and diastolic (congestive) heart failure: Secondary | ICD-10-CM | POA: Diagnosis not present

## 2022-04-23 DIAGNOSIS — J441 Chronic obstructive pulmonary disease with (acute) exacerbation: Secondary | ICD-10-CM | POA: Diagnosis not present

## 2022-04-23 MED ORDER — PREDNISONE 10 MG PO TABS: 40.0000 mg | ORAL_TABLET | Freq: Every day | ORAL | 0 refills | Status: DC

## 2022-04-23 NOTE — Progress Notes (Signed)
Kimberly Hobbs    [id number]    06/16/1960  Primary Care Physician:Nche, Charlene Brooke, NP Date of Appointment: 04/23/2022 Established Patient Visit  Chief complaint:   Chief Complaint  Patient presents with   Follow-up    Currently in a-fid      HPI: Kimberly Hobbs is a 62 y.o. woman with previous tobacco use disorder 60 pack years, quit 2018. Gold stage 0 based on PFTs. Additional PMH CAD s/p CABG, mechanical AVR and MVR 2018, A.fib on Coumadin.  Interval Updates: Here for follow up visit.  Worsening dyspnea since  Having worsening dyspnea, had to be wheeled into the exam room in a wheel chair.  No lower extremity edema. Chest xray shows CHF. She was started on higher dose lasix - 60am/20pm. She has been monitoring her weight daily and has actually lost weight over the last few months. She is having orthopnea and notes desaturation at home in the 80s when she checks her saturation at night lying down.   Albuterol nebs help a little.  Stiolto doesn't help much.    She notes even before being in A. Fib she was having dyspnea with minimal exertion around the house.  She does have nocturnal cough related to reflux.   She does also have chronic post nasal drainage.     I have reviewed the patient's family social and past medical history and updated as appropriate.   Past Medical History:  Diagnosis Date   CKD stage 3 due to type 2 diabetes mellitus ([place]) 05/15/2021   COVID-19    Hyperlipidemia    Hypertension    Pain in both lower extremities 07/23/2019   Rheumatic heart disease    MS/ AS    Past Surgical History:  Procedure Laterality Date   CARDIAC CATHETERIZATION     CARDIOVERSION N/A 04/02/2022   Procedure: CARDIOVERSION;  Surgeon: Nigel Mormon, MD;  Location: [facility];  Service: Cardiovascular;  Laterality: N/A;   CHOLECYSTECTOMY     CORONARY/GRAFT ANGIOGRAPHY N/A 08/18/2018   Procedure: CORONARY/GRAFT ANGIOGRAPHY;  Surgeon:  Nigel Mormon, MD;  Location: [facility];  Service: Cardiovascular;  Laterality: N/A;   LEG SURGERY     "metal plate in leg"   RIGHT HEART CATH AND CORONARY/GRAFT ANGIOGRAPHY N/A 07/21/2018   Procedure: RIGHT HEART CATH AND CORONARY/GRAFT ANGIOGRAPHY;  Surgeon: Nigel Mormon, MD;  Location: [facility];  Service: Cardiovascular;  Laterality: N/A;   TEE WITHOUT CARDIOVERSION N/A 07/27/2015   Procedure: TRANSESOPHAGEAL ECHOCARDIOGRAM (TEE);  Surgeon: Sanda Klein, MD;  Location[facility] ENDOSCOPY;  Service: Cardiovascular;  Laterality: N/A;   TEE WITHOUT CARDIOVERSION N/A 07/21/2018   Procedure: TRANSESOPHAGEAL ECHOCARDIOGRAM (TEE);  Surgeon: Nigel Mormon, MD;  Location: [facility] ENDOSCOPY;  Service: Cardiovascular;  Laterality: N/A;   TONSILLECTOMY AND ADENOIDECTOMY      Family History  Problem Relation Age of Onset   Cancer Mother    Hypertension Mother    Diabetes Mother    Hyperlipidemia Mother    Heart disease Mother    Cancer Father    Hypertension Father    Diabetes Father    Heart disease Father    Hypertension Sister    Hypertension Brother    Hypertension Brother    Schizophrenia Maternal Grandmother     Social History   Occupational History   Not on file  Tobacco Use   Smoking status: Former    Packs/day: 1.50    Years:  40.00    Total pack years: 60.00    Types: Cigarettes    Quit date: 12/23/2016    Years since quitting: 5.3   Smokeless tobacco: Never  Vaping Use   Vaping Use: Never used  Substance and Sexual Activity   Alcohol use: Not Currently   Drug use: No   Sexual activity: Not Currently     Physical Exam: Blood pressure 120/76, pulse (!) 104, temperature 98.7 F (37.1 C), temperature source Oral, height '5\' 5"'$  (1.651 m), weight 218 lb 12.8 oz (99.2 kg), last menstrual period 06/20/2015, SpO2 94 %.  Gen:      No acute distress, no coughing Lungs:  expiratory wheezing best auscultated over throat.  CV:        irregularly  irregular, no pedal edema   Data Reviewed: Imaging: I have personally reviewed the chest xray March 2024 shows cardiomegaly, mild CHF  PFTs:     Latest Ref Rng & Units 07/07/2019    8:58 AM  PFT Results  FVC-Pre L 2.60   FVC-Predicted Pre % 74   FVC-Post L 2.83   FVC-Predicted Post % 80   Pre FEV1/FVC % % 77   Post FEV1/FCV % % 79   FEV1-Pre L 2.00   FEV1-Predicted Pre % 73   FEV1-Post L 2.22   DLCO uncorrected ml/min/mmHg 17.22   DLCO UNC% % 81   DLCO corrected ml/min/mmHg 17.22   DLCO COR %Predicted % 81   DLVA Predicted % 89   TLC L 5.58   TLC % Predicted % 107   RV % Predicted % 138    I have personally reviewed the patient's PFTs and no airflow limitation but with bd response to BD  Feb 2024 Echo shows depressed LVEF 53% - normally functioning mechanical valves  Labs:  Immunization status: Immunization History  Administered Date(s) Administered   Fluad Quad(high Dose 65+) 02/01/2021   Influenza,inj,Quad PF,6+ Mos 12/13/2021   Influenza-Unspecified 01/14/2019   Zoster Recombinat (Shingrix) 05/19/2021, 09/18/2021    External Records Personally Reviewed: pulmonary, pcp, cardiology  Assessment:  Acute COPD exacerbation, possibly worsened by CHF given recent A. Fib S/p MVR COPD, Gold Stage 0 Chronic rhinitis, GERD, UACS History of tobacco use Need for lung cancer screening - next in sept 2024  Plan/Recommendations: I think your breathing symptoms are most likely your heart. However, let's eliminate the lungs. Let's try prednisone 40 mg for 5 days and let me know if that eliminates your wheezing and breathing difficulties. Keep taking inhalers as prescribed.   Follow up with Dr. Virgina Jock in cardiology - he may need to increase your lasix.   Hopefully you will feel better with a correction of your A. Fib as well.  We will order an overnight oximetry test to check your oxygen levels - I will prescribe oxygen if required.   Continue astelin and cetirizne  with nasal saline spray.   Start taking astelin nasal spray 1 spray each side of your nose once a day. Continue saline nasal spray as needed.  Continue cetirizine at night.  Continue acid reflux medicine  With upper airway predominant wheezing, would consider tracheomalacia or vocal cord edema from CHF flare. This may be a mechanical wheeze rather than truly bronchospasm.   Return to Care: Return in about 4 weeks (around 05/21/2022).   Lenice Llamas, MD Pulmonary and [place]

## 2022-04-23 NOTE — Patient Instructions (Addendum)
Please schedule follow up scheduled with myself in 4 weeks.  If my schedule is not open yet, we will contact you with a reminder closer to that time. Please call [phone] if you haven't heard from [place] a month before.   I think your breathing symptoms are most likely your heart. However, let's eliminate the lungs. Let's try prednisone 40 mg for 5 days and let me know if that eliminates your wheezing and breathing difficulties. Keep taking inhalers as prescribed.   Follow up with Dr. Virgina Jock in cardiology - he may need to increase your lasix.  Hopefully you will feel better with a correction of your A. Fib as well. We will order an overnight oximetry test to check your oxygen levels - I will prescribe oxygen if required.

## 2022-04-24 ENCOUNTER — Encounter (HOSPITAL_COMMUNITY): Payer: Self-pay

## 2022-04-24 ENCOUNTER — Ambulatory Visit (HOSPITAL_COMMUNITY): Admit: 2022-04-24 | Payer: 59 | Admitting: Cardiology

## 2022-04-24 SURGERY — CARDIOVERSION
Anesthesia: Monitor Anesthesia Care

## 2022-04-25 ENCOUNTER — Ambulatory Visit: Payer: 59 | Admitting: Cardiology

## 2022-04-25 ENCOUNTER — Inpatient Hospital Stay (HOSPITAL_COMMUNITY)
Admission: RE | Admit: 2022-04-25 | Discharge: 2022-04-27 | DRG: 291 | Disposition: A | Discharge status: Home / Self Care | Payer: 59 | Attending: Cardiology | Admitting: Cardiology

## 2022-04-25 ENCOUNTER — Ambulatory Visit (HOSPITAL_COMMUNITY): Admission: RE | Admit: 2022-04-25 | Payer: 59 | Source: Ambulatory Visit

## 2022-04-25 ENCOUNTER — Encounter: Payer: Self-pay | Admitting: Cardiology

## 2022-04-25 ENCOUNTER — Inpatient Hospital Stay (HOSPITAL_COMMUNITY): Payer: 59

## 2022-04-25 ENCOUNTER — Encounter (HOSPITAL_COMMUNITY): Payer: Self-pay

## 2022-04-25 VITALS — BP 90/63 | HR 87 | Ht 65.0 in | Wt 216.8 lb

## 2022-04-25 DIAGNOSIS — Z7984 Long term (current) use of oral hypoglycemic drugs: Secondary | ICD-10-CM | POA: Diagnosis not present

## 2022-04-25 DIAGNOSIS — Z818 Family history of other mental and behavioral disorders: Secondary | ICD-10-CM

## 2022-04-25 DIAGNOSIS — J449 Chronic obstructive pulmonary disease, unspecified: Secondary | ICD-10-CM | POA: Diagnosis present

## 2022-04-25 DIAGNOSIS — I130 Hypertensive heart and chronic kidney disease with heart failure and stage 1 through stage 4 chronic kidney disease, or unspecified chronic kidney disease: Principal | ICD-10-CM | POA: Diagnosis present

## 2022-04-25 DIAGNOSIS — I4891 Unspecified atrial fibrillation: Secondary | ICD-10-CM

## 2022-04-25 DIAGNOSIS — I2722 Pulmonary hypertension due to left heart disease: Secondary | ICD-10-CM | POA: Diagnosis present

## 2022-04-25 DIAGNOSIS — Z91041 Radiographic dye allergy status: Secondary | ICD-10-CM

## 2022-04-25 DIAGNOSIS — Z87891 Personal history of nicotine dependence: Secondary | ICD-10-CM

## 2022-04-25 DIAGNOSIS — Z7901 Long term (current) use of anticoagulants: Secondary | ICD-10-CM

## 2022-04-25 DIAGNOSIS — E785 Hyperlipidemia, unspecified: Secondary | ICD-10-CM | POA: Diagnosis not present

## 2022-04-25 DIAGNOSIS — Z7982 Long term (current) use of aspirin: Secondary | ICD-10-CM | POA: Diagnosis not present

## 2022-04-25 DIAGNOSIS — Z833 Family history of diabetes mellitus: Secondary | ICD-10-CM | POA: Diagnosis not present

## 2022-04-25 DIAGNOSIS — K219 Gastro-esophageal reflux disease without esophagitis: Secondary | ICD-10-CM | POA: Diagnosis present

## 2022-04-25 DIAGNOSIS — I2510 Atherosclerotic heart disease of native coronary artery without angina pectoris: Secondary | ICD-10-CM | POA: Diagnosis present

## 2022-04-25 DIAGNOSIS — N189 Chronic kidney disease, unspecified: Secondary | ICD-10-CM | POA: Diagnosis not present

## 2022-04-25 DIAGNOSIS — I480 Paroxysmal atrial fibrillation: Secondary | ICD-10-CM | POA: Diagnosis present

## 2022-04-25 DIAGNOSIS — Z8249 Family history of ischemic heart disease and other diseases of the circulatory system: Secondary | ICD-10-CM

## 2022-04-25 DIAGNOSIS — E1122 Type 2 diabetes mellitus with diabetic chronic kidney disease: Secondary | ICD-10-CM | POA: Diagnosis present

## 2022-04-25 DIAGNOSIS — I25810 Atherosclerosis of coronary artery bypass graft(s) without angina pectoris: Secondary | ICD-10-CM | POA: Diagnosis not present

## 2022-04-25 DIAGNOSIS — Z951 Presence of aortocoronary bypass graft: Secondary | ICD-10-CM | POA: Diagnosis not present

## 2022-04-25 DIAGNOSIS — Z880 Allergy status to penicillin: Secondary | ICD-10-CM

## 2022-04-25 DIAGNOSIS — R0602 Shortness of breath: Secondary | ICD-10-CM | POA: Diagnosis not present

## 2022-04-25 DIAGNOSIS — Z79899 Other long term (current) drug therapy: Secondary | ICD-10-CM

## 2022-04-25 DIAGNOSIS — F419 Anxiety disorder, unspecified: Secondary | ICD-10-CM | POA: Diagnosis present

## 2022-04-25 DIAGNOSIS — I509 Heart failure, unspecified: Secondary | ICD-10-CM | POA: Diagnosis not present

## 2022-04-25 DIAGNOSIS — Z8616 Personal history of COVID-19: Secondary | ICD-10-CM

## 2022-04-25 DIAGNOSIS — I5033 Acute on chronic diastolic (congestive) heart failure: Secondary | ICD-10-CM | POA: Diagnosis not present

## 2022-04-25 DIAGNOSIS — I25118 Atherosclerotic heart disease of native coronary artery with other forms of angina pectoris: Secondary | ICD-10-CM

## 2022-04-25 DIAGNOSIS — Z952 Presence of prosthetic heart valve: Secondary | ICD-10-CM

## 2022-04-25 DIAGNOSIS — Z83438 Family history of other disorder of lipoprotein metabolism and other lipidemia: Secondary | ICD-10-CM | POA: Diagnosis not present

## 2022-04-25 DIAGNOSIS — I110 Hypertensive heart disease with heart failure: Secondary | ICD-10-CM | POA: Diagnosis not present

## 2022-04-25 MED ORDER — WARFARIN - PHARMACIST DOSING INPATIENT: Freq: Every day | Status: DC

## 2022-04-25 MED ORDER — ARFORMOTEROL TARTRATE 15 MCG/2ML IN NEBU
15.0000 ug | INHALATION_SOLUTION | Freq: Two times a day (BID) | RESPIRATORY_TRACT | Status: DC
  Administered 2022-04-25 – 2022-04-27 (×4): 15 ug via RESPIRATORY_TRACT
  Filled 2022-04-25 (×5): qty 2

## 2022-04-25 MED ORDER — FUROSEMIDE 10 MG/ML IJ SOLN
40.0000 mg | Freq: Two times a day (BID) | INTRAMUSCULAR | Status: DC
  Administered 2022-04-26 – 2022-04-27 (×3): 40 mg via INTRAVENOUS
  Filled 2022-04-25 (×3): qty 4

## 2022-04-25 MED ORDER — SODIUM CHLORIDE 0.9 % IV SOLN: 4.0000 mg | Freq: Four times a day (QID) | INTRAVENOUS | Status: DC | PRN

## 2022-04-25 MED ORDER — METHOCARBAMOL 500 MG PO TABS: 500.0000 mg | ORAL_TABLET | Freq: Three times a day (TID) | ORAL | Status: DC | PRN

## 2022-04-25 MED ORDER — SODIUM CHLORIDE 0.9% FLUSH: 3.0000 mL | INTRAVENOUS | Status: DC | PRN

## 2022-04-25 MED ORDER — ROSUVASTATIN CALCIUM 20 MG PO TABS
40.0000 mg | ORAL_TABLET | Freq: Every day | ORAL | Status: DC
  Administered 2022-04-26 – 2022-04-27 (×2): 40 mg via ORAL
  Filled 2022-04-25 (×2): qty 2

## 2022-04-25 MED ORDER — SODIUM CHLORIDE 0.9% FLUSH
3.0000 mL | Freq: Two times a day (BID) | INTRAVENOUS | Status: DC
  Administered 2022-04-25 – 2022-04-27 (×4): 3 mL via INTRAVENOUS

## 2022-04-25 MED ORDER — CLONAZEPAM 0.5 MG PO TABS
0.5000 mg | ORAL_TABLET | Freq: Every day | ORAL | Status: DC
  Administered 2022-04-25 – 2022-04-26 (×2): 0.5 mg via ORAL
  Filled 2022-04-25 (×2): qty 1

## 2022-04-25 MED ORDER — CITALOPRAM HYDROBROMIDE 20 MG PO TABS
20.0000 mg | ORAL_TABLET | Freq: Two times a day (BID) | ORAL | Status: DC
  Administered 2022-04-25 – 2022-04-27 (×4): 20 mg via ORAL
  Filled 2022-04-25 (×4): qty 1

## 2022-04-25 MED ORDER — METOPROLOL SUCCINATE ER 25 MG PO TB24
25.0000 mg | ORAL_TABLET | Freq: Every day | ORAL | Status: DC
  Administered 2022-04-26 – 2022-04-27 (×2): 25 mg via ORAL
  Filled 2022-04-25 (×2): qty 1

## 2022-04-25 MED ORDER — PREDNISONE 20 MG PO TABS
40.0000 mg | ORAL_TABLET | Freq: Every day | ORAL | Status: DC
  Administered 2022-04-26 – 2022-04-27 (×2): 40 mg via ORAL
  Filled 2022-04-25 (×2): qty 2

## 2022-04-25 MED ORDER — UMECLIDINIUM BROMIDE 62.5 MCG/ACT IN AEPB
1.0000 | INHALATION_SPRAY | Freq: Every day | RESPIRATORY_TRACT | Status: DC
  Administered 2022-04-27: 1 via RESPIRATORY_TRACT
  Filled 2022-04-25 (×2): qty 7

## 2022-04-25 MED ORDER — PANTOPRAZOLE SODIUM 40 MG PO TBEC
40.0000 mg | DELAYED_RELEASE_TABLET | Freq: Two times a day (BID) | ORAL | Status: DC
  Administered 2022-04-25 – 2022-04-27 (×4): 40 mg via ORAL
  Filled 2022-04-25 (×4): qty 1

## 2022-04-25 MED ORDER — SODIUM CHLORIDE 0.9 % IV SOLN: 250.0000 mL | INTRAVENOUS | Status: DC | PRN

## 2022-04-25 MED ORDER — HYDROCODONE-ACETAMINOPHEN 5-325 MG PO TABS: 1.0000 | ORAL_TABLET | Freq: Four times a day (QID) | ORAL | Status: DC | PRN

## 2022-04-25 MED ORDER — ASPIRIN 81 MG PO TBEC
81.0000 mg | DELAYED_RELEASE_TABLET | Freq: Every day | ORAL | Status: DC
  Administered 2022-04-26 – 2022-04-27 (×2): 81 mg via ORAL
  Filled 2022-04-25 (×2): qty 1

## 2022-04-25 MED ORDER — ACETAMINOPHEN 325 MG PO TABS
650.0000 mg | ORAL_TABLET | ORAL | Status: DC | PRN
  Administered 2022-04-26: 650 mg via ORAL
  Filled 2022-04-25: qty 2

## 2022-04-25 MED ORDER — ACETAMINOPHEN 500 MG PO TABS: 1000.0000 mg | ORAL_TABLET | Freq: Four times a day (QID) | ORAL | Status: DC | PRN

## 2022-04-25 NOTE — H&P (Addendum)
Kimberly Hobbs is an 62 y.o. female.   Chief Complaint: Afib HPI:   62 y.o. 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   Patient underwent cardioversion on 04/02/2022. However, patient has been in and out of atrial fibrillation since.  In the meantime, she has had worsening shortness of breath, with congestive heart failure noted on chest x-ray.  Today, she has dyspnea at rest.  On a separate note, she was started on prednisone for possibility of COPD exacerbation by pulmonologist Dr. Shearon Hobbs.  Past Medical History:  Diagnosis Date   CKD stage 3 due to type 2 diabetes mellitus ([place]) 05/15/2021   COVID-19    Hyperlipidemia    Hypertension    Pain in both lower extremities 07/23/2019   Rheumatic heart disease    MS/ AS    Past Surgical History:  Procedure Laterality Date   CARDIAC CATHETERIZATION     CARDIOVERSION N/A 04/02/2022   Procedure: CARDIOVERSION;  Surgeon: Kimberly Mormon, MD;  Location: [facility];  Service: Cardiovascular;  Laterality: N/A;   CHOLECYSTECTOMY     CORONARY/GRAFT ANGIOGRAPHY N/A 08/18/2018   Procedure: CORONARY/GRAFT ANGIOGRAPHY;  Surgeon: Kimberly Mormon, MD;  Location: [facility];  Service: Cardiovascular;  Laterality: N/A;   LEG SURGERY     "metal plate in leg"   RIGHT HEART CATH AND CORONARY/GRAFT ANGIOGRAPHY N/A 07/21/2018   Procedure: RIGHT HEART CATH AND CORONARY/GRAFT ANGIOGRAPHY;  Surgeon: Kimberly Mormon, MD;  Location: [facility];  Service: Cardiovascular;  Laterality: N/A;   TEE WITHOUT CARDIOVERSION N/A 07/27/2015   Procedure: TRANSESOPHAGEAL ECHOCARDIOGRAM (TEE);  Surgeon: Kimberly Klein, MD;  Location: [facility] ENDOSCOPY;  Service: Cardiovascular;  Laterality: N/A;   TEE WITHOUT CARDIOVERSION N/A 07/21/2018   Procedure:  TRANSESOPHAGEAL ECHOCARDIOGRAM (TEE);  Surgeon: Kimberly Mormon, MD;  Location: [facility] ENDOSCOPY;  Service: Cardiovascular;  Laterality: N/A;   TONSILLECTOMY AND ADENOIDECTOMY       Family History  Problem Relation Age of Onset   Cancer Mother    Hypertension Mother    Diabetes Mother    Hyperlipidemia Mother    Heart disease Mother    Cancer Father    Hypertension Father    Diabetes Father    Heart disease Father    Hypertension Sister    Hypertension Brother    Hypertension Brother    Schizophrenia Maternal Grandmother     Social History:  reports that she quit smoking about 5 years ago. Her smoking use included cigarettes. She has a 60.00 pack-year smoking history. She has never used smokeless tobacco. She reports that she does not currently use alcohol. She reports that she does not use drugs.  Allergies:  Allergies  Allergen Reactions   Iodinated Contrast Media Other (See Comments)    Patient is unsure of reaction type   Penicillins Other (See Comments)    Immune to drug , does not work per patient  Did it involve swelling of the face/tongue/throat, SOB, or low BP? No  Did it involve sudden or severe rash/hives, skin peeling, or any reaction on the inside of your mouth or nose? No  Did you need to seek medical attention at a hospital or doctor's office? No  When did it last happen? NOT A TRUE ALLERGY    If all above answers are "NO", may proceed with cephalosporin use.  Immune to drug , does not work per patient  Did it involve swelling of the face/tongue/throat, SOB, or low BP? No Did it involve sudden or severe rash/hives, skin peeling, or any reaction on the inside of your mouth or nose? No Did you need to seek medical attention at a hospital or doctor's office? No When did it last happen? NOT A TRUE ALLERGY   If all above answers are "NO", may proceed with cephalosporin use., , Immune to drug , does not work per patient    Review of Systems  Cardiovascular:   Positive for dyspnea on exertion. Negative for chest pain, leg swelling, palpitations and syncope.     Last menstrual period 06/20/2015. There is no height or weight on file to calculate BMI.   Physical Exam Vitals and nursing note reviewed.  Constitutional:      General: She is not in acute distress. Neck:     Vascular: No JVD.  Cardiovascular:     Rate and Rhythm: Tachycardia present. Rhythm irregular.     Heart sounds: Normal heart sounds. No murmur heard. Pulmonary:     Effort: Tachypnea and accessory muscle usage present.     Breath sounds: Normal breath sounds. No wheezing or rales.  Musculoskeletal:     Right lower leg: No edema.     Left lower leg: No edema.      Medications Prior to Admission  Medication Sig Dispense Refill   acetaminophen (TYLENOL) 500 MG tablet Take 1,000 mg by mouth every 6 (six) hours as needed (for pain.).     amiodarone (PACERONE) 200 MG tablet Take 1 tablet (200 mg total) by mouth as directed. Take 1 tab daily. When in Afib take 1 tab twice a day for 3 days 120 tablet 3   aspirin EC 81 MG tablet Take 81 mg by mouth daily.      azelastine (ASTELIN) 0.1 % nasal spray Place 1 spray into both nostrils 2 (two) times daily. Use in each nostril as directed 30 mL 5   BD PEN NEEDLE NANO 2ND GEN 32G X 4 MM MISC USE 1 PEN NEEDLE TWICE DAILY BETWEEN MEALS     cetirizine (ZYRTEC) 10 MG tablet Take 1 tablet (10 mg total) by mouth daily. 30 tablet 11   citalopram (CELEXA) 40 MG tablet Take 0.5 tablets (20 mg total) by mouth 2 (two) times daily. 90 tablet 3   clonazePAM (KLONOPIN) 0.5 MG tablet TAKE 1 AND 1/2 TABLETS(0.75 MG) BY MOUTH AT BEDTIME 45 tablet 2   diltiazem (CARDIZEM CD) 180 MG 24 hr capsule TAKE 1 CAPSULE BY MOUTH DAILY 90 capsule 2   furosemide (LASIX) 20 MG tablet Take 1 tablet (20 mg total) by mouth 2 (two) times daily. (Patient taking differently: Take 20 mg by mouth 2 (two) times daily. '60mg'$  in the AM and afternoon '20mg'$ ) 60 tablet 2    HYDROcodone-acetaminophen (NORCO/VICODIN) 5-325 MG tablet Take 1 tablet by mouth every 6 (six) hours as needed for moderate pain.     isosorbide mononitrate (IMDUR) 60 MG 24 hr tablet TAKE 1 TABLET(60 MG) BY MOUTH DAILY 90 tablet 3   Lancets (ONETOUCH DELICA PLUS Q000111Q) MISC TAKE AS DIRECTED ONCE DAILY.     levalbuterol (XOPENEX HFA) 45 MCG/ACT inhaler Inhale 1-2 puffs into the lungs every 6 (six) hours as needed for shortness of breath. 1 each 12   levalbuterol (XOPENEX) 0.63 MG/3ML nebulizer solution Take 3 mLs (0.63 mg total) by nebulization every 4 (four) hours as needed  for wheezing or shortness of breath. 3 mL 12   losartan (COZAAR) 50 MG tablet Take 1 tablet (50 mg total) by mouth daily. 90 tablet 3   metFORMIN (GLUCOPHAGE) 500 MG tablet Take 1 tablet (500 mg total) by mouth 2 (two) times daily with a meal. 180 tablet 3   methocarbamol (ROBAXIN) 500 MG tablet Take 1 tablet (500 mg total) by mouth every 8 (eight) hours as needed for muscle spasms. (Patient taking differently: Take 500 mg by mouth 2 (two) times daily.) 40 tablet 0   metoprolol succinate (TOPROL-XL) 25 MG 24 hr tablet Take 1 tablet (25 mg total) by mouth daily. Take with or immediately following a meal. 90 tablet 3   nitroGLYCERIN (NITROSTAT) 0.4 MG SL tablet Place 1 tablet (0.4 mg total) under the tongue every 5 (five) minutes x 3 doses as needed for chest pain. 25 tablet 3   ONETOUCH VERIO test strip AS DIRECTED ONCE A DAY     pantoprazole (PROTONIX) 40 MG tablet Take 1 tablet (40 mg total) by mouth 2 (two) times daily before a meal. (Patient taking differently: Take 40 mg by mouth daily.) 180 tablet 3   predniSONE (DELTASONE) 10 MG tablet Take 4 tablets (40 mg total) by mouth daily with breakfast. 20 tablet 0   rosuvastatin (CRESTOR) 40 MG tablet Take 1 tablet (40 mg total) by mouth daily. 90 tablet 1   spironolactone (ALDACTONE) 50 MG tablet TAKE 1 TABLET(50 MG) BY MOUTH DAILY 30 tablet 6   Tiotropium Bromide-Olodaterol  (STIOLTO RESPIMAT) 2.5-2.5 MCG/ACT AERS Inhale 2 puffs into the lungs daily. 4 g 0   warfarin (COUMADIN) 5 MG tablet TAKE 1 TABLET BY MOUTH DAILY (Patient taking differently: Take 5 mg by mouth 3 (three) times a week. Wed, Thurs, and Sat) 90 tablet 2   warfarin (COUMADIN) 7.5 MG tablet TAKE 1 TABLET BY MOUTH EVERY EVENING (Patient taking differently: Take 7.5 mg by mouth 4 (four) times a week. Sun, Mon, Tues, and Fri) 90 tablet 2      Current Facility-Administered Medications:    0.9 %  sodium chloride infusion, 250 mL, Intravenous, PRN, Yisroel Mullendore J, MD   acetaminophen (TYLENOL) tablet 650 mg, 650 mg, Oral, Q4H PRN, Zena Vitelli J, MD   arformoterol (BROVANA) nebulizer solution 15 mcg, 15 mcg, Nebulization, BID **AND** [START ON 04/26/2022] umeclidinium bromide (INCRUSE ELLIPTA) 62.5 MCG/ACT 1 puff, 1 puff, Inhalation, Daily, Gabbriella Presswood J, MD   [START ON 04/26/2022] aspirin EC tablet 81 mg, 81 mg, Oral, Daily, Evamaria Detore J, MD   citalopram (CELEXA) tablet 20 mg, 20 mg, Oral, BID, Ram Haugan J, MD   clonazePAM (KLONOPIN) tablet 0.5 mg, 0.5 mg, Oral, QHS, Keionna Kinnaird J, MD   furosemide (LASIX) injection 40 mg, 40 mg, Intravenous, BID, Lynley Killilea J, MD   [START ON 04/26/2022] metoprolol succinate (TOPROL-XL) 24 hr tablet 25 mg, 25 mg, Oral, Daily, Catalaya Garr J, MD   ondansetron (ZOFRAN) 4 mg in sodium chloride 0.9 % 50 mL IVPB, 4 mg, Intravenous, Q6H PRN, Micholas Drumwright J, MD   pantoprazole (PROTONIX) EC tablet 40 mg, 40 mg, Oral, BID, Corbet Hanley J, MD   [START ON 04/26/2022] predniSONE (DELTASONE) tablet 40 mg, 40 mg, Oral, Q breakfast, Shella Lahman J, MD   [START ON 04/26/2022] rosuvastatin (CRESTOR) tablet 40 mg, 40 mg, Oral, Daily, Rayven Hendrickson J, MD   sodium chloride flush (NS) 0.9 % injection 3 mL, 3 mL, Intravenous, Q12H, Jenai Scaletta J, MD   sodium  chloride flush (NS) 0.9 % injection 3 mL, 3 mL, Intravenous,  PRN, Fardeen Steinberger J, MD   There were no vitals filed for this visit. There is no height or weight on file to calculate BMI.  Lab Results: Reviewed and interpreted: '@LABSECTIONRESULTS'$ @    Tests ordered:  Lab Orders         HIV Antibody (routine testing w rflx)         Comprehensive metabolic panel         Brain natriuretic peptide         Basic metabolic panel         CBC with Differential         Magnesium         Magnesium         Protime-INR       Cardiac Studies:  EKG 04/25/2022: Atrial fibrillation 109 bpm LAFB   Echocardiogram 04/05/2022:  Mildly depressed LV systolic function with EF 53%. Left ventricle cavity  is normal in size. Normal global wall motion. Calculated EF 53%.  Mechanical trileaflet aortic valve.  No aortic valve regurgitation. AVA  (VTI) measures 1.4 cm^2. AV Mean Grad measures 20.6 mmHg. AV Pk Vel  measures 3.39 m/s.  Mechanical mitral valve.  No mitral valve regurgitation. E-wave dominant  mitral inflow.  Structurally normal tricuspid valve.  Mild tricuspid regurgitation. RVSP  measures 35 mmHg.  IVC is dilated with respiratory variation.  Normally functioning prosthetic valves.  Personally reviewed and compared with previous echocardiogram 06/01/2020. No significant change noted.    EKG 04/04/2022: Sinus rhythm 77 bpm Occasional PAC   Left axis -anterior fascicular block     Cardioversion 04/02/2022   EKG 03/07/2022: Atrial fibrillation 107 bpm IVCD Anteroseptal infarct -age undetermined Nonspecific ST depression -Nondiagnostic   Coronary and bypass graft angiography 07/21/2018 and 08/18/2018: LM: Normal LAD: 100% mid occlusion. Mild distal disease LIMA-LAD: Patent LCx: Normal RCA: Prox 60% stenosis, mid 30-40% stenoses. TIMI III flow in prox-mid RCA. 100% distal occlusion SVG-RCA: Patent. Ostial 40%. Distal RCA moderate diffuse disease.    Guide catheter angiography provided superior images. There is TIMI III flow in SVG  which fills RCA all the way to the ostium. Even if she were to have FFR positive lesion in the graft, I do not think the benefits of a stent would outweigh the risk of losing the graft and the entire RCA territory circulation in a borderline lesion. Also, the prox RCA lesion is well bypassed by the SVG graft. Thus, I decided not to perform FFR/PCI to either of these lesions. Continue medical management.    [place] 07/21/2018: #1: RA: 18 mmHg RV 90/12 mmHg PA: 94/40 mmHg. Mean PA 63 mmHg. PW 33 mmHg  Lasix 80 mg  #2: RA 19 mmHg RV 60/0 mmHg PA: 56/16 mmHg. Mean PA 36 mmHg PW: 25 mmHg  CO: 5.4 L/min. CI 2.6 L/min/m2 PVR 2 WU  Impression: Elevated filling pressures Moderate WHO Grp II pulmonary hypertension Improvement in filling pressures with diuresis.   Echocardiogram 02/26/2018: Left ventricle cavity is normal in size. Mild concentric hypertrophy of the left ventricle. Abnormal septal wall motion due to post-operative valve. Diastolic function could not be assessed due to post op valve and CABG status.  Calculated EF 63%. Left atrial cavity is moderate to severely dilated measures 4.5 cm in long axis. Right atrial cavity is mildly dilated. Mechanical aortic valve with trace regurgitation. Mild aortic valve leaflet calcification. Mildly restricted aortic valve leaflets. Mild to moderate aortic  valve stenosis. Aortic valve peak pressure gradient of  43 and mean gradient of 21 mmHg, calculated aortic valve area    0.88 cm. Mechanical mitral valve with trace regurgitation. Moderately restricted mitral valve leaflets. Mild mitral valve stenosis. Mitral valve peak pressure gradient of  26  and mean gradient of  6.2  mmHg, calculated mitral valve area 1.9   cm. Mild to moderate tricuspid regurgitation. Mild pulmonary hypertension. PA systolic pressure estimated at 39 mm Hg. Compared to the study done on 08/06/2017, no significant change.   TEE 07/21/2018:  1. The left ventricle has normal  systolic function, with an ejection fraction of 55-60%. There is abnormal septal motion consistent with post-operative status.  2. The right ventricle has normal systolc function.  3. A 25 mm Regent mechanical valve is present in the mitral position. Procedure Date: 2018 Echo findings are consistent with is functioning normally the mitral prosthesis. Normal mitral valve prosthesis. Mean PG 6 mmHg, MVA 1.7 cm2. No thrombus seen.  (Patient known to have subtherapeutic INR).  4. A 65m [employer] mechanical prosthesis valve is present in the aortic position. Procedure Date: 2018 Normal aortic valve prosthesis. Echo findings shows no evidence of Accelration time of 88 msec suggests normal functioning valve. Mean PG 25 mmHg  likely due to small annular diameter. No prosthetic valve stenosis noted. of the aortic prosthesis. No thrombus seen. (Patient known to have subtherapeutic INR).  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 Dyspneic and tachypnic at rest.  Recommend hospitalization with plans for IV diuresis. Hold amiodarone. Will likely start Tikosyn tomorrow. After 3 days of Tikosyn if she remains in Afib, will plan for inpatient cardioversion on 04/29/2022.  Continue metoprolol., diltiazem for now.  High [id number] score. Continue warfarin.   COPD: Stage 0. On prednisone. Will taper. Will need SSI to manage blood glucose.   CAD of native and bypass grafts without angina: Patent grafts with non-critical disease.(07/2018). Continue Aspirin, statin.   S/p MVR, AVR: Continue warfarin    Hypertension: Controlled.       MNigel Mormon MD Pager: [phone]: [phone]

## 2022-04-25 NOTE — Progress Notes (Signed)
Subjective:   Kimberly Hobbs, female    DOB: 1960/03/29, 62 y.o.   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  Patient underwent cardioversion on 04/02/2022. However, patient has been in and out of atrial fibrillation since.  In the meantime, she has had worsening shortness of breath, with congestive heart failure noted on chest x-ray.  Today, she has dyspnea at rest.  On a separate note, she was started on prednisone for possibility of COPD exacerbation by pulmonologist Dr. Shearon Stalls.   Current Outpatient Medications:    acetaminophen (TYLENOL) 500 MG tablet, Take 1,000 mg by mouth every 6 (six) hours as needed (for pain.)., Disp: , Rfl:    amiodarone (PACERONE) 200 MG tablet, Take 1 tablet (200 mg total) by mouth as directed. Take 1 tab daily. When in Afib take 1 tab twice a day for 3 days, Disp: 120 tablet, Rfl: 3   aspirin EC 81 MG tablet, Take 81 mg by mouth daily. , Disp: , Rfl:    azelastine (ASTELIN) 0.1 % nasal spray, Place 1 spray into both nostrils 2 (two) times daily. Use in each nostril as directed (Patient not taking: Reported on 04/23/2022), Disp: 30 mL, Rfl: 5   BD PEN NEEDLE NANO 2ND GEN 32G X 4 MM MISC, USE 1 PEN NEEDLE TWICE DAILY BETWEEN MEALS, Disp: , Rfl:    cetirizine (ZYRTEC) 10 MG tablet, Take 1 tablet (10 mg total) by mouth daily., Disp: 30 tablet, Rfl: 11   citalopram (CELEXA) 40 MG tablet, Take 0.5 tablets (20 mg total) by mouth 2 (two) times daily., Disp: 90 tablet, Rfl: 3   clonazePAM (KLONOPIN) 0.5 MG tablet, TAKE 1 AND 1/2 TABLETS(0.75 MG) BY MOUTH AT BEDTIME, Disp: 45 tablet, Rfl: 2   diltiazem (CARDIZEM CD) 180 MG 24 hr capsule, TAKE 1 CAPSULE BY MOUTH DAILY, Disp: 90 capsule, Rfl: 2    furosemide (LASIX) 20 MG tablet, Take 1 tablet (20 mg total) by mouth 2 (two) times daily., Disp: 60 tablet, Rfl: 2   HYDROcodone-acetaminophen (NORCO/VICODIN) 5-325 MG tablet, Take 1 tablet by mouth every 6 (six) hours as needed for moderate pain., Disp: , Rfl:    isosorbide mononitrate (IMDUR) 60 MG 24 hr tablet, TAKE 1 TABLET(60 MG) BY MOUTH DAILY, Disp: 90 tablet, Rfl: 3   Lancets (ONETOUCH DELICA PLUS Q000111Q) MISC, TAKE AS DIRECTED ONCE DAILY., Disp: , Rfl:    levalbuterol (XOPENEX HFA) 45 MCG/ACT inhaler, Inhale 1-2 puffs into the lungs every 6 (six) hours as needed for shortness of breath., Disp: 1 each, Rfl: 12   levalbuterol (XOPENEX) 0.63 MG/3ML nebulizer solution, Take 3 mLs (0.63 mg total) by nebulization every 4 (four) hours as needed for wheezing or shortness of breath., Disp: 3 mL, Rfl: 12   losartan (COZAAR) 50 MG tablet, Take 1 tablet (50 mg total) by mouth daily., Disp: 90 tablet, Rfl: 3   metFORMIN (GLUCOPHAGE) 500 MG tablet, Take 1 tablet (500 mg total) by mouth 2 (two) times daily with a meal., Disp: 180 tablet, Rfl: 3   methocarbamol (ROBAXIN) 500 MG tablet, Take 1 tablet (500 mg total) by mouth every 8 (eight) hours as needed for muscle spasms. (Patient taking differently: Take 500 mg by mouth 2 (two) times daily.), Disp: 40 tablet, Rfl:  0   metoprolol succinate (TOPROL-XL) 25 MG 24 hr tablet, Take 1 tablet (25 mg total) by mouth daily. Take with or immediately following a meal., Disp: 90 tablet, Rfl: 3   nitroGLYCERIN (NITROSTAT) 0.4 MG SL tablet, Place 1 tablet (0.4 mg total) under the tongue every 5 (five) minutes x 3 doses as needed for chest pain., Disp: 25 tablet, Rfl: 3   ONETOUCH VERIO test strip, AS DIRECTED ONCE A DAY, Disp: , Rfl:    pantoprazole (PROTONIX) 40 MG tablet, Take 1 tablet (40 mg total) by mouth 2 (two) times daily before a meal. (Patient taking differently: Take 40 mg by mouth daily.), Disp: 180 tablet, Rfl: 3   predniSONE (DELTASONE) 10 MG tablet, Take  4 tablets (40 mg total) by mouth daily with breakfast., Disp: 20 tablet, Rfl: 0   rosuvastatin (CRESTOR) 40 MG tablet, Take 1 tablet (40 mg total) by mouth daily., Disp: 90 tablet, Rfl: 1   spironolactone (ALDACTONE) 50 MG tablet, TAKE 1 TABLET(50 MG) BY MOUTH DAILY, Disp: 30 tablet, Rfl: 6   Tiotropium Bromide-Olodaterol (STIOLTO RESPIMAT) 2.5-2.5 MCG/ACT AERS, Inhale 2 puffs into the lungs daily., Disp: 4 g, Rfl: 0   warfarin (COUMADIN) 5 MG tablet, TAKE 1 TABLET BY MOUTH DAILY (Patient taking differently: Take 5 mg by mouth 3 (three) times a week. Wed, Thurs, and Sat), Disp: 90 tablet, Rfl: 2   warfarin (COUMADIN) 7.5 MG tablet, TAKE 1 TABLET BY MOUTH EVERY EVENING (Patient taking differently: Take 7.5 mg by mouth 4 (four) times a week. Sun, Mon, Tues, and Fri), Disp: 90 tablet, Rfl: 2  Cardiovascular studies:  EKG 04/25/2022: Atrial fibrillation 109 bpm LAFB  Echocardiogram 04/05/2022:  Mildly depressed LV systolic function with EF 53%. Left ventricle cavity  is normal in size. Normal global wall motion. Calculated EF 53%.  Mechanical trileaflet aortic valve.  No aortic valve regurgitation. AVA  (VTI) measures 1.4 cm^2. AV Mean Grad measures 20.6 mmHg. AV Pk Vel  measures 3.39 m/s.  Mechanical mitral valve.  No mitral valve regurgitation. E-wave dominant  mitral inflow.  Structurally normal tricuspid valve.  Mild tricuspid regurgitation. RVSP  measures 35 mmHg.  IVC is dilated with respiratory variation.  Normally functioning prosthetic valves.  Personally reviewed and compared with previous echocardiogram 06/01/2020. No significant change noted.   EKG 04/04/2022: Sinus rhythm 77 bpm Occasional PAC   Left axis -anterior fascicular block   Cardioversion 04/02/2022  EKG 03/07/2022: Atrial fibrillation 107 bpm IVCD Anteroseptal infarct -age undetermined Nonspecific ST depression -Nondiagnostic  Coronary and bypass graft angiography 07/21/2018 and 08/18/2018: LM: Normal LAD: 100%  mid occlusion. Mild distal disease LIMA-LAD: Patent LCx: Normal RCA: Prox 60% stenosis, mid 30-40% stenoses. TIMI III flow in prox-mid RCA. 100% distal occlusion SVG-RCA: Patent. Ostial 40%. Distal RCA moderate diffuse disease.    Guide catheter angiography provided superior images. There is TIMI III flow in SVG which fills RCA all the way to the ostium. Even if she were to have FFR positive lesion in the graft, I do not think the benefits of a stent would outweigh the risk of losing the graft and the entire RCA territory circulation in a borderline lesion. Also, the prox RCA lesion is well bypassed by the SVG graft. Thus, I decided not to perform FFR/PCI to either of these lesions. Continue medical management.   [facility] 07/21/2018: #1: RA: 18 mmHg RV 90/12 mmHg PA: 94/40 mmHg. Mean PA 63 mmHg. PW 33 mmHg  Lasix 80 mg  #2: RA 19 mmHg  RV 60/0 mmHg PA: 56/16 mmHg. Mean PA 36 mmHg PW: 25 mmHg  CO: 5.4 L/min. CI 2.6 L/min/m2 PVR 2 WU  Impression: Elevated filling pressures Moderate WHO Grp II pulmonary hypertension Improvement in filling pressures with diuresis.   Echocardiogram 02/26/2018: Left ventricle cavity is normal in size. Mild concentric hypertrophy of the left ventricle. Abnormal septal wall motion due to post-operative valve. Diastolic function could not be assessed due to post op valve and CABG status.  Calculated EF 63%. Left atrial cavity is moderate to severely dilated measures 4.5 cm in long axis. Right atrial cavity is mildly dilated. Mechanical aortic valve with trace regurgitation. Mild aortic valve leaflet calcification. Mildly restricted aortic valve leaflets. Mild to moderate aortic valve stenosis. Aortic valve peak pressure gradient of  43 and mean gradient of 21 mmHg, calculated aortic valve area    0.88 cm. Mechanical mitral valve with trace regurgitation. Moderately restricted mitral valve leaflets. Mild mitral valve stenosis. Mitral valve peak pressure gradient  of  26  and mean gradient of  6.2  mmHg, calculated mitral valve area 1.9   cm. Mild to moderate tricuspid regurgitation. Mild pulmonary hypertension. PA systolic pressure estimated at 39 mm Hg. Compared to the study done on 08/06/2017, no significant change.  TEE 07/21/2018:  1. The left ventricle has normal systolic function, with an ejection fraction of 55-60%. There is abnormal septal motion consistent with post-operative status.  2. The right ventricle has normal systolc function.  3. A 25 mm Regent mechanical valve is present in the mitral position. Procedure Date: 2018 Echo findings are consistent with is functioning normally the mitral prosthesis. Normal mitral valve prosthesis. Mean PG 6 mmHg, MVA 1.7 cm2. No thrombus seen.  (Patient known to have subtherapeutic INR).  4. A 26m [employer] mechanical prosthesis valve is present in the aortic position. Procedure Date: 2018 Normal aortic valve prosthesis. Echo findings shows no evidence of Accelration time of 88 msec suggests normal functioning valve. Mean PG 25 mmHg  likely due to small annular diameter. No prosthetic valve stenosis noted. of the aortic prosthesis. No thrombus seen. (Patient known to have subtherapeutic INR).  Recent labs: 04/02/2022: Glucose 144, BUN/Cr 23/1.5. EGFR NA. Na/K 138/4.0. Rest of the CMP normal H/H 10.5/31.  12/24/2021: Glucose 96, BUN/Cr 22/1.26. EGFR 46. Na/K 137/5.1.  HbA1C 6.3%  07/2021: H/H 10.8/34. MCV 80. Platelets 240  04/2021: Chol 116, TG 115, HDL 45, LDL 48  06/2020: TSH 2.8 normal  02/16/2018: BNP 251 elevated   Review of Systems  Cardiovascular:  Positive for dyspnea on exertion. Negative for chest pain, leg swelling, palpitations and syncope.  Musculoskeletal:  Positive for joint pain.       Right forearm/elbow pain  Neurological:  Positive for dizziness.         Vitals:   04/25/22 1513  BP: 90/63  Pulse: 87  SpO2: 92%      Objective:     Physical Exam Vitals and  nursing note reviewed.  Constitutional:      General: She is not in acute distress. Neck:     Vascular: No JVD.  Cardiovascular:     Rate and Rhythm: Tachycardia present. Rhythm irregular.     Heart sounds: Normal heart sounds. No murmur heard.    Comments: Metallic S1, S2 Pulmonary:     Effort: Tachypnea and accessory muscle usage present.     Breath sounds: Normal breath sounds. No wheezing or rales.  Musculoskeletal:     Right lower leg: No  edema.     Left lower leg: No edema.          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 Dyspneic and tachypnic at rest.  Recommend hospitalization with plans for IV diuresis. Hold amiodarone. Will likely start Tikosyn tomorrow. After 3 days of Tikosyn if she remains in Afib, will plan for inpatient cardioversion on 04/29/2022.  Continue metoprolol., diltiazem for now.  High CHA2DS2VAsc score. Continue warfarin.  COPD: Stage 0. On prednisone. Will taper. Will need SSI to manage blood glucose.  CAD of native and bypass grafts without angina: Patent grafts with non-critical disease.(07/2018). Continue Aspirin, statin.  S/p MVR, AVR: Continue warfarin  Hypertension: Controlled.  F/u in 2 weeks    Nigel Mormon, MD Pager: [phone] Office: [phone]

## 2022-04-26 LAB — BASIC METABOLIC PANEL
Anion gap: 11 (ref 5–15)
BUN: 37 mg/dL — ABNORMAL HIGH (ref 6–20)
CO2: 23 mmol/L (ref 22–32)
Calcium: 8.7 mg/dL — ABNORMAL LOW (ref 8.9–10.3)
Chloride: 103 mmol/L (ref 98–111)
Creatinine, Ser: 1.72 mg/dL — ABNORMAL HIGH (ref 0.44–1.00)
GFR, Estimated: 34 mL/min — ABNORMAL LOW (ref >60)
Glucose, Bld: 153 mg/dL — ABNORMAL HIGH (ref 70–99)
Potassium: 3.8 mmol/L (ref 3.5–5.1)
Sodium: 137 mmol/L (ref 135–145)

## 2022-04-26 LAB — MAGNESIUM: Magnesium: 2.4 mg/dL (ref 1.7–2.4)

## 2022-04-26 LAB — PROTIME-INR
INR: 3.6 — ABNORMAL HIGH (ref 0.8–1.2)
INR: 3 — ABNORMAL HIGH (ref 0.8–1.2)
Prothrombin Time: 35.7 seconds — ABNORMAL HIGH (ref 11.4–15.2)
Prothrombin Time: 30.7 seconds — ABNORMAL HIGH (ref 11.4–15.2)

## 2022-04-26 MED ORDER — SODIUM CHLORIDE 0.9 % IV SOLN: INTRAVENOUS | Status: DC

## 2022-04-26 MED ORDER — POTASSIUM CHLORIDE CRYS ER 20 MEQ PO TBCR
40.0000 meq | EXTENDED_RELEASE_TABLET | Freq: Once | ORAL | Status: AC
  Administered 2022-04-26: 40 meq via ORAL
  Filled 2022-04-26: qty 2

## 2022-04-26 MED ORDER — POLYETHYLENE GLYCOL 3350 17 G PO PACK
17.0000 g | PACK | Freq: Every day | ORAL | Status: DC
  Administered 2022-04-26 – 2022-04-27 (×2): 17 g via ORAL
  Filled 2022-04-26 (×2): qty 1

## 2022-04-26 MED ORDER — WARFARIN SODIUM 7.5 MG PO TABS
7.5000 mg | ORAL_TABLET | Freq: Once | ORAL | Status: AC
  Administered 2022-04-26: 7.5 mg via ORAL
  Filled 2022-04-26: qty 1

## 2022-04-26 MED ORDER — LEVALBUTEROL HCL 0.63 MG/3ML IN NEBU: 0.6300 mg | INHALATION_SOLUTION | RESPIRATORY_TRACT | Status: DC | PRN

## 2022-04-26 MED ORDER — DIGOXIN 125 MCG PO TABS
0.1250 mg | ORAL_TABLET | Freq: Every day | ORAL | Status: DC
  Administered 2022-04-26: 0.125 mg via ORAL
  Filled 2022-04-26: qty 1

## 2022-04-26 NOTE — Anesthesia Preprocedure Evaluation (Addendum)
Anesthesia Evaluation  Patient identified by MRN, date of birth, ID band Patient awake    Reviewed: Allergy & Precautions, NPO status , Patient's Chart, lab work & pertinent test results  Airway Mallampati: II  TM Distance: >3 FB Neck ROM: Full    Dental no notable dental hx.    Pulmonary COPD, former smoker   Pulmonary exam normal        Cardiovascular hypertension, Pt. on medications and Pt. on home beta blockers + CAD and +CHF   Rhythm:Irregular Rate:Normal  ECHO 02/24: Echocardiogram 04/05/2022: Mildly depressed LV systolic function with EF 53%. Left ventricle cavity is normal in size. Normal global wall motion. Calculated EF 53%. Mechanical trileaflet aortic valve.  No aortic valve regurgitation. AVA (VTI) measures 1.4 cm^2. AV Mean Grad measures 20.6 mmHg. AV Pk Vel measures 3.39 m/s. Mechanical mitral valve.  No mitral valve regurgitation. E-wave dominant mitral inflow. Structurally normal tricuspid valve.  Mild tricuspid regurgitation. RVSP measures 35 mmHg. IVC is dilated with respiratory variation. Normally functioning prosthetic valves.     Neuro/Psych   Anxiety        GI/Hepatic Neg liver ROS,GERD  Medicated,,  Endo/Other  diabetes, Type 2, Oral Hypoglycemic Agents    Renal/GU CRFRenal disease  negative genitourinary   Musculoskeletal  (+) Arthritis , Osteoarthritis,    Abdominal Normal abdominal exam  (+)   Peds  Hematology negative hematology ROS (+)   Anesthesia Other Findings   Reproductive/Obstetrics                             Anesthesia Physical Anesthesia Plan  ASA: 3  Anesthesia Plan: General   Post-op Pain Management:    Induction: Intravenous  PONV Risk Score and Plan: 3 and Treatment may vary due to age or medical condition  Airway Management Planned: Mask  Additional Equipment: None  Intra-op Plan:   Post-operative Plan:   Informed Consent:  I have reviewed the patients History and Physical, chart, labs and discussed the procedure including the risks, benefits and alternatives for the proposed anesthesia with the patient or authorized representative who has indicated his/her understanding and acceptance.     Dental advisory given  Plan Discussed with: CRNA  Anesthesia Plan Comments:        Anesthesia Quick Evaluation

## 2022-04-26 NOTE — Progress Notes (Signed)
Subjective:  Feels short of breath   Current Facility-Administered Medications:    0.9 %  sodium chloride infusion, 250 mL, Intravenous, PRN, Lamondre Wesche J, MD   acetaminophen (TYLENOL) tablet 650 mg, 650 mg, Oral, Q4H PRN, Shadavia Dampier J, MD   arformoterol (BROVANA) nebulizer solution 15 mcg, 15 mcg, Nebulization, BID, 15 mcg at 04/26/22 0849 **AND** umeclidinium bromide (INCRUSE ELLIPTA) 62.5 MCG/ACT 1 puff, 1 puff, Inhalation, Daily, Vedha Tercero J, MD   aspirin EC tablet 81 mg, 81 mg, Oral, Daily, Alaster Asfaw J, MD, 81 mg at 04/26/22 0949   citalopram (CELEXA) tablet 20 mg, 20 mg, Oral, BID, Elise Knobloch J, MD, 20 mg at 04/26/22 0948   clonazePAM (KLONOPIN) tablet 0.5 mg, 0.5 mg, Oral, QHS, Karen Huhta J, MD, 0.5 mg at 04/25/22 2121   digoxin (LANOXIN) tablet 0.125 mg, 0.125 mg, Oral, Daily, Lateisha Thurlow J, MD, 0.125 mg at 04/26/22 0952   furosemide (LASIX) injection 40 mg, 40 mg, Intravenous, BID, Mykai Wendorf J, MD, 40 mg at 04/26/22 0948   levalbuterol (XOPENEX) nebulizer solution 0.63 mg, 0.63 mg, Nebulization, Q4H PRN, Kaari Zeigler J, MD   metoprolol succinate (TOPROL-XL) 24 hr tablet 25 mg, 25 mg, Oral, Daily, Tobey Lippard J, MD, 25 mg at 04/26/22 0948   ondansetron (ZOFRAN) 4 mg in sodium chloride 0.9 % 50 mL IVPB, 4 mg, Intravenous, Q6H PRN, Jaquell Seddon J, MD   pantoprazole (PROTONIX) EC tablet 40 mg, 40 mg, Oral, BID, Tilda Samudio J, MD, 40 mg at 04/26/22 0948   predniSONE (DELTASONE) tablet 40 mg, 40 mg, Oral, Q breakfast, Zayne Marovich J, MD, 40 mg at 04/26/22 0622   rosuvastatin (CRESTOR) tablet 40 mg, 40 mg, Oral, Daily, Aeson Sawyers J, MD, 40 mg at 04/26/22 0947   sodium chloride flush (NS) 0.9 % injection 3 mL, 3 mL, Intravenous, Q12H, Kayln Garceau J, MD, 3 mL at 04/26/22 0949   sodium chloride flush (NS) 0.9 % injection 3 mL, 3 mL, Intravenous, PRN, Ginny Loomer J, MD   warfarin  (COUMADIN) tablet 7.5 mg, 7.5 mg, Oral, ONCE-1600, Caine Barfield J, MD   Warfarin - Pharmacist Dosing Inpatient, , Does not apply, q1600, Pham, Minh Q, RPH-CPP   Objective:  Vital Signs in the last 24 hours: Temp:  [97.5 F (36.4 C)-98.2 F (36.8 C)] 98 F (36.7 C) (03/08 1106) Pulse Rate:  [70-113] 92 (03/08 1106) Resp:  [18-20] 18 (03/08 1106) BP: (90-133)/(45-82) 133/55 (03/08 1106) SpO2:  [91 %-98 %] 91 % (03/08 1106) Weight:  [97.5 kg-98.4 kg] 97.5 kg (03/08 0448)  Intake/Output from previous day: 03/07 0701 - 03/08 0700 In: -  Out: 200 [Urine:200]  Physical Exam Vitals and nursing note reviewed.  Constitutional:      General: She is not in acute distress. Neck:     Vascular: No JVD.  Cardiovascular:     Rate and Rhythm: Tachycardia present. Rhythm irregular.     Heart sounds: Murmur heard.     Harsh midsystolic murmur is present with a grade of 2/6 at the upper right sternal border radiating to the neck.  Pulmonary:     Effort: Pulmonary effort is normal.     Breath sounds: Normal breath sounds. No wheezing or rales.  Musculoskeletal:     Right lower leg: Edema (1+) present.     Left lower leg: Edema (1+) present.      Imaging/tests reviewed and independently interpreted: Stable mild cardiomegaly. Vascular congestion without frank pulmonary edema.    Cardiac  Studies:  Telemetry 04/26/2022: Afib w/RVR  EKG 04/25/2022: Afib w/RVR  Echocardiogram 04/05/2022: Mildly depressed LV systolic function with EF 53%. Left ventricle cavity  is normal in size. Normal global wall motion. Calculated EF 53%.  Mechanical trileaflet aortic valve.  No aortic valve regurgitation. AVA  (VTI) measures 1.4 cm^2. AV Mean Grad measures 20.6 mmHg. AV Pk Vel  measures 3.39 m/s.  Mechanical mitral valve.  No mitral valve regurgitation. E-wave dominant  mitral inflow.  Structurally normal tricuspid valve.  Mild tricuspid regurgitation. RVSP  measures 35 mmHg.  IVC is dilated  with respiratory variation.  Normally functioning prosthetic valves.  Personally reviewed and compared with previous echocardiogram 06/01/2020. No significant change noted.     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 Dyspneic and tachypnic at rest.  Continue IV Lasix 40 mg twice daily. Initial plan was to initiate Tikosyn.  However, given recent amiodarone use, that would not be ideal. Blood pressure soft to use both beta-blocker and calcium blocker. Continue metoprolol succinate 25 mg daily. Instead, started p.o. digoxin. High CHA2DS2VAsc score. Continue warfarin. Will plan on inpatient cardioversion 04/27/2022.  N.p.o. after midnight.   COPD: Stage 0. On prednisone. Will taper. Will need SSI to manage blood glucose.   CAD of native and bypass grafts without angina: Patent grafts with non-critical disease.(07/2018). Continue Aspirin, statin.   S/p MVR, AVR: Continue warfarin   Hypertension: Controlled.     Nigel Mormon, MD Pager: [phone] Office: [phone]

## 2022-04-26 NOTE — TOC Progression Note (Signed)
Transition of Care [facility]) - Progression Note    Patient Details  Name: Kimberly Hobbs MRN: [id number] Date of Birth: August 27, 1960  [facility]) CM/SW Contact  Zenon Mayo, RN Phone Number: 04/26/2022, 2:01 PM  Clinical Narrative:    Patient is from home, afib symptomatic, has hx of AVR and MVR.  Hx of Rheumatic heart dx, hx of CABG, conts on iv lasix.  TOC following.         Expected Discharge Plan and Services                                               Social Determinants of Health (SDOH) Interventions SDOH Screenings   Food Insecurity: No Food Insecurity (10/10/2021)  Housing: Low Risk  (10/10/2021)  Transportation Needs: No Transportation Needs (10/10/2021)  Alcohol Screen: Low Risk  (10/10/2021)  Depression (PHQ2-9): Medium Risk (03/26/2022)  Financial Resource Strain: Low Risk  (10/10/2021)  Physical Activity: Inactive (10/10/2021)  Social Connections: Moderately Integrated (10/10/2021)  Stress: No Stress Concern Present (10/10/2021)  Tobacco Use: Medium Risk (04/25/2022)    Readmission Risk Interventions     No data to display

## 2022-04-26 NOTE — Progress Notes (Signed)
ANTICOAGULATION CONSULT NOTE - Initial Consult  Pharmacy Consult for warfarin Indication: atrial fibrillation and MVR/AVR  Allergies  Allergen Reactions   Iodinated Contrast Media Other (See Comments)    Patient is unsure of reaction type   Penicillins Other (See Comments)    Immune to drug , does not work per patient  Did it involve swelling of the face/tongue/throat, SOB, or low BP? No  Did it involve sudden or severe rash/hives, skin peeling, or any reaction on the inside of your mouth or nose? No  Did you need to seek medical attention at a hospital or doctor's office? No  When did it last happen? NOT A TRUE ALLERGY    If all above answers are "NO", may proceed with cephalosporin use.  Immune to drug , does not work per patient  Did it involve swelling of the face/tongue/throat, SOB, or low BP? No Did it involve sudden or severe rash/hives, skin peeling, or any reaction on the inside of your mouth or nose? No Did you need to seek medical attention at a hospital or doctor's office? No When did it last happen? NOT A TRUE ALLERGY   If all above answers are "NO", may proceed with cephalosporin use., , Immune to drug , does not work per patient    Patient Measurements: Height: '5\' 5"'$  (165.1 cm) Weight: 97.5 kg (215 lb) IBW/kg (Calculated) : 57 Heparin Dosing Weight: 79.4 kg   Vital Signs: Temp: 97.5 F (36.4 C) (03/08 0734) Temp Source: Oral (03/08 0734) BP: 109/55 (03/08 0734) Pulse Rate: 91 (03/08 0734)  Labs: Recent Labs    04/26/22 0104  LABPROT 35.7*  INR 3.6*  CREATININE 1.72*    Estimated Creatinine Clearance: 40.2 mL/min (A) (by C-G formula based on SCr of 1.72 mg/dL (H)).   Medical History: Past Medical History:  Diagnosis Date   CKD stage 3 due to type 2 diabetes mellitus ([place]) 05/15/2021   COVID-19    Hyperlipidemia    Hypertension    Pain in both lower extremities 07/23/2019   Rheumatic heart disease    MS/ AS    Medications:  Scheduled:    arformoterol  15 mcg Nebulization BID   And   umeclidinium bromide  1 puff Inhalation Daily   aspirin EC  81 mg Oral Daily   citalopram  20 mg Oral BID   clonazePAM  0.5 mg Oral QHS   furosemide  40 mg Intravenous BID   metoprolol succinate  25 mg Oral Daily   pantoprazole  40 mg Oral BID   predniSONE  40 mg Oral Q breakfast   rosuvastatin  40 mg Oral Daily   sodium chloride flush  3 mL Intravenous Q12H   Warfarin - Pharmacist Dosing Inpatient   Does not apply q1600    Assessment: 52 yof presenting with dyspneic and tachypnic in Afib with RVR. On warfarin PTA for hx Afib and AVR/MVR (LD 3/6).  Prior to admission regimen is 7.5 mg daily except 5 mg WedThusSat.   INR today is slightly supra-therapeutic at 3.6. No s/sx of bleeding.   Goal of Therapy:  INR 2.5-3.5 Monitor platelets by anticoagulation protocol: Yes   Plan:  Order warfarin 7.5 mg tonight Monitor daily INR, CBC, and for s/sx of bleeding   Antonietta Jewel, PharmD, BCCCP Clinical Pharmacist  Phone: [phone] 04/26/2022 8:51 AM  Please check AMION for all [facility] phone numbers After 10:00 PM, call [facility] [phone]

## 2022-04-27 ENCOUNTER — Inpatient Hospital Stay (HOSPITAL_COMMUNITY): Payer: 59 | Admitting: Anesthesiology

## 2022-04-27 ENCOUNTER — Encounter (HOSPITAL_COMMUNITY)
Admission: RE | Disposition: A | Discharge status: Home / Self Care | Payer: Self-pay | Source: Home / Self Care | Attending: Cardiology

## 2022-04-27 ENCOUNTER — Encounter (HOSPITAL_COMMUNITY): Payer: Self-pay | Admitting: Cardiology

## 2022-04-27 DIAGNOSIS — I2510 Atherosclerotic heart disease of native coronary artery without angina pectoris: Secondary | ICD-10-CM

## 2022-04-27 DIAGNOSIS — Z7984 Long term (current) use of oral hypoglycemic drugs: Secondary | ICD-10-CM

## 2022-04-27 DIAGNOSIS — Z87891 Personal history of nicotine dependence: Secondary | ICD-10-CM

## 2022-04-27 DIAGNOSIS — I4891 Unspecified atrial fibrillation: Secondary | ICD-10-CM

## 2022-04-27 DIAGNOSIS — N189 Chronic kidney disease, unspecified: Secondary | ICD-10-CM

## 2022-04-27 DIAGNOSIS — I509 Heart failure, unspecified: Secondary | ICD-10-CM

## 2022-04-27 DIAGNOSIS — I130 Hypertensive heart and chronic kidney disease with heart failure and stage 1 through stage 4 chronic kidney disease, or unspecified chronic kidney disease: Secondary | ICD-10-CM

## 2022-04-27 DIAGNOSIS — E1122 Type 2 diabetes mellitus with diabetic chronic kidney disease: Secondary | ICD-10-CM

## 2022-04-27 HISTORY — PX: CARDIOVERSION: SHX1299

## 2022-04-27 LAB — BASIC METABOLIC PANEL
Anion gap: 10 (ref 5–15)
BUN: 38 mg/dL — ABNORMAL HIGH (ref 6–20)
CO2: 24 mmol/L (ref 22–32)
Calcium: 8.9 mg/dL (ref 8.9–10.3)
Chloride: 104 mmol/L (ref 98–111)
Creatinine, Ser: 1.4 mg/dL — ABNORMAL HIGH (ref 0.44–1.00)
GFR, Estimated: 43 mL/min — ABNORMAL LOW (ref >60)
Glucose, Bld: 99 mg/dL (ref 70–99)
Potassium: 3.4 mmol/L — ABNORMAL LOW (ref 3.5–5.1)
Sodium: 138 mmol/L (ref 135–145)

## 2022-04-27 LAB — PROTIME-INR
INR: 2.8 — ABNORMAL HIGH (ref 0.8–1.2)
Prothrombin Time: 29.2 seconds — ABNORMAL HIGH (ref 11.4–15.2)

## 2022-04-27 LAB — MAGNESIUM: Magnesium: 2.5 mg/dL — ABNORMAL HIGH (ref 1.7–2.4)

## 2022-04-27 SURGERY — CARDIOVERSION
Anesthesia: General

## 2022-04-27 MED ORDER — CLONAZEPAM 0.5 MG PO TABS
1.0000 mg | ORAL_TABLET | Freq: Once | ORAL | Status: AC
  Administered 2022-04-27: 1 mg via ORAL
  Filled 2022-04-27: qty 2

## 2022-04-27 MED ORDER — FUROSEMIDE 20 MG PO TABS: 40.0000 mg | ORAL_TABLET | Freq: Two times a day (BID) | ORAL | 3 refills | Status: DC

## 2022-04-27 MED ORDER — PROPOFOL 10 MG/ML IV BOLUS
INTRAVENOUS | Status: DC | PRN
  Administered 2022-04-27: 70 mg via INTRAVENOUS

## 2022-04-27 MED ORDER — PHENYLEPHRINE 80 MCG/ML (10ML) SYRINGE FOR IV PUSH (FOR BLOOD PRESSURE SUPPORT)
PREFILLED_SYRINGE | INTRAVENOUS | Status: DC | PRN
  Administered 2022-04-27 (×2): 40 ug via INTRAVENOUS

## 2022-04-27 MED ORDER — POTASSIUM CHLORIDE CRYS ER 20 MEQ PO TBCR
40.0000 meq | EXTENDED_RELEASE_TABLET | Freq: Once | ORAL | Status: AC
  Administered 2022-04-27: 40 meq via ORAL
  Filled 2022-04-27: qty 2

## 2022-04-27 MED ORDER — POTASSIUM CHLORIDE CRYS ER 20 MEQ PO TBCR: 40.0000 meq | EXTENDED_RELEASE_TABLET | Freq: Every day | ORAL | 3 refills | Status: DC

## 2022-04-27 MED ORDER — EPHEDRINE SULFATE-NACL 50-0.9 MG/10ML-% IV SOSY
PREFILLED_SYRINGE | INTRAVENOUS | Status: DC | PRN
  Administered 2022-04-27: 2.5 mg via INTRAVENOUS

## 2022-04-27 MED ORDER — LIDOCAINE 2% (20 MG/ML) 5 ML SYRINGE
INTRAMUSCULAR | Status: DC | PRN
  Administered 2022-04-27: 60 mg via INTRAVENOUS

## 2022-04-27 NOTE — Anesthesia Postprocedure Evaluation (Signed)
Anesthesia Post Note  Patient: Kimberly Hobbs  Procedure(s) Performed: CARDIOVERSION     Patient location during evaluation: PACU Anesthesia Type: General Level of consciousness: awake and alert Pain management: pain level controlled Vital Signs Assessment: post-procedure vital signs reviewed and stable Respiratory status: spontaneous breathing, nonlabored ventilation, respiratory function stable and patient connected to nasal cannula oxygen Cardiovascular status: blood pressure returned to baseline and stable Postop Assessment: no apparent nausea or vomiting Anesthetic complications: no   No notable events documented.  Last Vitals:  Vitals:   04/27/22 1127 04/27/22 1145  BP: (!) 117/50   Pulse: 72   Resp: 16   Temp:  36.8 C  SpO2: 92%     Last Pain:  Vitals:   04/27/22 1145  TempSrc: Oral  PainSc:                  March Rummage Tirrell Buchberger
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
## 2022-04-27 NOTE — Progress Notes (Signed)
ANTICOAGULATION CONSULT NOTE - Initial Consult  Pharmacy Consult for warfarin Indication: atrial fibrillation and MVR/AVR  Allergies  Allergen Reactions   Iodinated Contrast Media Other (See Comments)    Patient is unsure of reaction type   Penicillins Other (See Comments)    Immune to drug , does not work per patient  Did it involve swelling of the face/tongue/throat, SOB, or low BP? No  Did it involve sudden or severe rash/hives, skin peeling, or any reaction on the inside of your mouth or nose? No  Did you need to seek medical attention at a hospital or doctor's office? No  When did it last happen? NOT A TRUE ALLERGY    If all above answers are "NO", may proceed with cephalosporin use.  Immune to drug , does not work per patient  Did it involve swelling of the face/tongue/throat, SOB, or low BP? No Did it involve sudden or severe rash/hives, skin peeling, or any reaction on the inside of your mouth or nose? No Did you need to seek medical attention at a hospital or doctor's office? No When did it last happen? NOT A TRUE ALLERGY   If all above answers are "NO", may proceed with cephalosporin use., , Immune to drug , does not work per patient    Patient Measurements: Height: '5\' 5"'$  (165.1 cm) Weight: 96.7 kg (213 lb 3.2 oz) IBW/kg (Calculated) : 57 Heparin Dosing Weight: 79.4 kg   Vital Signs: Temp: 98.4 F (36.9 C) (03/09 0745) Temp Source: Oral (03/09 0745) BP: 118/51 (03/09 0954) Pulse Rate: 65 (03/09 0954)  Labs: Recent Labs    04/26/22 0104 04/26/22 1522 04/27/22 0041  LABPROT 35.7* 30.7* 29.2*  INR 3.6* 3.0* 2.8*  CREATININE 1.72*  --  1.40*     Estimated Creatinine Clearance: 49.2 mL/min (A) (by C-G formula based on SCr of 1.4 mg/dL (H)).   Medical History: Past Medical History:  Diagnosis Date   CKD stage 3 due to type 2 diabetes mellitus ([place]) 05/15/2021   COVID-19    Hyperlipidemia    Hypertension    Pain in both lower extremities 07/23/2019    Rheumatic heart disease    MS/ AS    Medications:  Scheduled:   arformoterol  15 mcg Nebulization BID   And   umeclidinium bromide  1 puff Inhalation Daily   aspirin EC  81 mg Oral Daily   citalopram  20 mg Oral BID   clonazePAM  0.5 mg Oral QHS   furosemide  40 mg Intravenous BID   metoprolol succinate  25 mg Oral Daily   pantoprazole  40 mg Oral BID   polyethylene glycol  17 g Oral Daily   predniSONE  40 mg Oral Q breakfast   rosuvastatin  40 mg Oral Daily   sodium chloride flush  3 mL Intravenous Q12H   Warfarin - Pharmacist Dosing Inpatient   Does not apply q1600    Assessment: 59 yof presenting with dyspneic and tachypnic in Afib with RVR. On warfarin PTA for hx Afib and AVR/MVR (LD 3/6).  Prior to admission regimen is 7.5 mg daily except 5 mg WedThusSat.   INR today is therapeutic at 2.8 after 7.'5mg'$  x1 yesterday (missed her dose on 3/7). No signs/symptoms of bleeding noted.   Goal of Therapy:  INR 2.5-3.5 Monitor platelets by anticoagulation protocol: Yes   Plan:  - Pt discharging today, but plan warfarin 5 mg tonight if pt is still here '@1600'$  - Monitor daily INR, CBC, and  for s/sx of bleeding    Billey Gosling, PharmD PGY1 Pharmacy Resident 3/9/202411:07 AM

## 2022-04-27 NOTE — Discharge Summary (Signed)
Physician Discharge Summary  Patient ID: Kimberly Hobbs MRN: [id number] DOB/AGE: 61-Sep-1962 61 y.o.  Admit date: 04/25/2022 Discharge date: 04/27/2022  Primary Discharge Diagnosis: Acute on chronic HFpEF Paroxysmal A-fib with RVR  Secondary Discharge Diagnosis: CAD s/p CABG H/o mechanical mitral and aortic valve replacement   Hospital Course:   62 y.o. 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   Patient was directly admitted from office due to acute on chronic HFpEF, A-fib with RVR.  Initial plan was to diurese and started on Tikosyn given that she had failed amiodarone therapy.  However, there has to be 3 months cooling off.  Between amiodarone or Tikosyn administration, unless amiodarone level is <0.3.  Amiodarone level lab takes 5-7 days to return.  Therefore, I decided to discontinue amiodarone, and not initiate Tikosyn at this time.  I briefly tried digoxin for rate control given that her blood pressure was low normal, without any significant benefit.  Therefore, I proceeded with elective cardioversion on 04/26/2021 with successful conversion to sinus rhythm.  Patient diuresed very well over the course of hospital stay. Patient feels very well, breathing is back to baseline, and wants to go home today.  Will discharge on Lasix 40 mg twice daily, with potassium supplement.  Resume metoprolol and diltiazem on discharge.  Patient has upcoming follow-up with EP for consideration of antiarrhythmic therapy versus ablation.   Discharge Exam: Blood pressure 133/65, pulse (!) 109, temperature 98.4 F (36.9 C), temperature source Oral, resp. rate 18, height '5\' 5"'$  (1.651 m), weight 96.7 kg, last menstrual period 06/20/2015, SpO2 97 %.   Physical Exam Vitals and nursing note reviewed.   Constitutional:      General: She is not in acute distress. Neck:     Vascular: No JVD.  Cardiovascular:     Rate and Rhythm: Normal rate and regular rhythm.     Heart sounds: Normal heart sounds. No murmur heard. Pulmonary:     Effort: Pulmonary effort is normal.     Breath sounds: Normal breath sounds. No wheezing or rales.  Musculoskeletal:     Right lower leg: No edema.     Left lower leg: No edema.      FOLLOW UP PLANS AND APPOINTMENTS Discharge Instructions     Amb referral to AFIB Clinic   Complete by: As directed       Allergies as of 04/27/2022       Reactions   Iodinated Contrast Media Other (See Comments)   Patient is unsure of reaction type   Penicillins Other (See Comments)   Immune to drug , does not work per patient Did it involve swelling of the face/tongue/throat, SOB, or low BP? No Did it involve sudden or severe rash/hives, skin peeling, or any reaction on the inside of your mouth or nose? No Did you need to seek medical attention at a hospital or doctor's office? No When did it last happen? NOT A TRUE ALLERGY   If all above answers are "NO", may proceed with cephalosporin use. Immune to drug , does not work per patient  Did it involve swelling of the face/tongue/throat, SOB, or low BP? No Did it involve sudden or severe rash/hives, skin peeling, or any reaction on the inside of your mouth or nose? No Did you need to seek medical attention at a hospital or doctor's  office? No When did it last happen? NOT A TRUE ALLERGY   If all above answers are "NO", may proceed with cephalosporin use., , Immune to drug , does not work per patient        Medication List     STOP taking these medications    amiodarone 200 MG tablet Commonly known as: PACERONE       TAKE these medications    acetaminophen 500 MG tablet Commonly known as: TYLENOL Take 1,000 mg by mouth every 6 (six) hours as needed (for pain.).   aspirin EC 81 MG tablet Take 81 mg by mouth  daily.   BD Pen Needle Nano 2nd Gen 32G X 4 MM Misc Generic drug: Insulin Pen Needle USE 1 PEN NEEDLE TWICE DAILY BETWEEN MEALS   cetirizine 10 MG tablet Commonly known as: ZYRTEC Take 1 tablet (10 mg total) by mouth daily.   citalopram 40 MG tablet Commonly known as: CELEXA Take 0.5 tablets (20 mg total) by mouth 2 (two) times daily.   clonazePAM 0.5 MG tablet Commonly known as: KLONOPIN TAKE 1 AND 1/2 TABLETS(0.75 MG) BY MOUTH AT BEDTIME What changed: See the new instructions.   diltiazem 180 MG 24 hr capsule Commonly known as: CARDIZEM CD TAKE 1 CAPSULE BY MOUTH DAILY   furosemide 20 MG tablet Commonly known as: LASIX Take 2 tablets (40 mg total) by mouth 2 (two) times daily. What changed: how much to take   HYDROcodone-acetaminophen 5-325 MG tablet Commonly known as: NORCO/VICODIN Take 1 tablet by mouth every 6 (six) hours as needed for moderate pain.   isosorbide mononitrate 60 MG 24 hr tablet Commonly known as: IMDUR TAKE 1 TABLET(60 MG) BY MOUTH DAILY What changed: See the new instructions.   levalbuterol 0.63 MG/3ML nebulizer solution Commonly known as: Xopenex Take 3 mLs (0.63 mg total) by nebulization every 4 (four) hours as needed for wheezing or shortness of breath.   levalbuterol 45 MCG/ACT inhaler Commonly known as: XOPENEX HFA Inhale 1-2 puffs into the lungs every 6 (six) hours as needed for shortness of breath.   losartan 50 MG tablet Commonly known as: COZAAR Take 1 tablet (50 mg total) by mouth daily.   metFORMIN 500 MG tablet Commonly known as: GLUCOPHAGE Take 1 tablet (500 mg total) by mouth 2 (two) times daily with a meal.   methocarbamol 500 MG tablet Commonly known as: ROBAXIN Take 1 tablet (500 mg total) by mouth every 8 (eight) hours as needed for muscle spasms. What changed: when to take this   metoprolol succinate 25 MG 24 hr tablet Commonly known as: TOPROL-XL Take 1 tablet (25 mg total) by mouth daily. Take with or immediately  following a meal.   nitroGLYCERIN 0.4 MG SL tablet Commonly known as: NITROSTAT Place 1 tablet (0.4 mg total) under the tongue every 5 (five) minutes x 3 doses as needed for chest pain.   OneTouch Delica Plus 123456 Misc TAKE AS DIRECTED ONCE DAILY.   OneTouch Verio test strip Generic drug: glucose blood AS DIRECTED ONCE A DAY   pantoprazole 40 MG tablet Commonly known as: PROTONIX Take 1 tablet (40 mg total) by mouth 2 (two) times daily before a meal. What changed: when to take this   potassium chloride SA 20 MEQ tablet Commonly known as: KLOR-CON M Take 2 tablets (40 mEq total) by mouth daily.   predniSONE 10 MG tablet Commonly known as: DELTASONE Take 4 tablets (40 mg total) by mouth daily with breakfast.   rosuvastatin  40 MG tablet Commonly known as: CRESTOR Take 1 tablet (40 mg total) by mouth daily.   spironolactone 50 MG tablet Commonly known as: ALDACTONE TAKE 1 TABLET(50 MG) BY MOUTH DAILY What changed: See the new instructions.   Stiolto Respimat 2.5-2.5 MCG/ACT Aers Generic drug: Tiotropium Bromide-Olodaterol Inhale 2 puffs into the lungs daily.   warfarin 7.5 MG tablet Commonly known as: COUMADIN Take as directed. If you are unsure how to take this medication, talk to your nurse or doctor. Original instructions: TAKE 1 TABLET BY MOUTH EVERY EVENING What changed:  when to take this additional instructions   warfarin 5 MG tablet Commonly known as: COUMADIN Take as directed. If you are unsure how to take this medication, talk to your nurse or doctor. Original instructions: TAKE 1 TABLET BY MOUTH DAILY What changed:  when to take this additional instructions        Follow-up Information     Vyron Fronczak, Reynold Bowen, MD Follow up.   Specialties: Cardiology, Radiology Why: 1:15 PM Contact information: [place] [address] [phone]                   Nigel Mormon, MD Pager:  [phone] Office: [phone]

## 2022-04-27 NOTE — Transfer of Care (Addendum)
Immediate Anesthesia Transfer of Care Note  Patient: Kimberly Hobbs  Procedure(s) Performed: CARDIOVERSION  Patient Location: [id number]  Anesthesia Type:General  Level of Consciousness: awake, alert , and oriented  Airway & Oxygen Therapy: Patient Spontanous Breathing  Post-op Assessment: Report given to RN and Post -op Vital signs reviewed and stable  Post vital signs: Reviewed and stable  Last Vitals: Pt sitting in bed comfortably talking with family. VSS. Vitals Value Taken Time  BP 97/57   Temp    Pulse 64 04/27/22 0922  Resp 12   SpO2 98 % 04/27/22 0922  Vitals shown include unvalidated device data.  Last Pain:  Vitals:   04/27/22 0745  TempSrc: Oral  PainSc: 0-No pain         Complications: No notable events documented.

## 2022-04-27 NOTE — CV Procedure (Signed)
Direct current cardioversion:  Indication symptomatic: Symptomatic atrial fibrillation  Procedure: Under deep sedation administered and monitored by anesthesiology, synchronized direct current cardioversion performed. Patient was delivered with 100, 150 Joules of electricity X 2 with success to NSR. Patient tolerated the procedure well. No immediate complication noted.    Nigel Mormon, MD Pager: [phone] Office: [phone]

## 2022-04-27 NOTE — Anesthesia Procedure Notes (Signed)
Procedure Name: General with mask airway Date/Time: 04/27/2022 9:00 AM  Performed by: Dorthea Cove, CRNAPre-anesthesia Checklist: Timeout performed, Patient being monitored, Suction available, Emergency Drugs available and Patient identified Patient Re-evaluated:Patient Re-evaluated prior to induction Oxygen Delivery Method: Simple face mask Preoxygenation: Pre-oxygenation with 100% oxygen Induction Type: IV induction Placement Confirmation: positive ETCO2 and CO2 detector Dental Injury: Teeth and Oropharynx as per pre-operative assessment

## 2022-04-29 ENCOUNTER — Telehealth: Payer: Self-pay | Admitting: Nurse Practitioner

## 2022-04-29 ENCOUNTER — Other Ambulatory Visit: Payer: Self-pay | Admitting: Nurse Practitioner

## 2022-04-29 ENCOUNTER — Encounter: Payer: Self-pay | Admitting: Cardiology

## 2022-04-29 ENCOUNTER — Telehealth: Payer: Self-pay

## 2022-04-29 DIAGNOSIS — Z952 Presence of prosthetic heart valve: Secondary | ICD-10-CM | POA: Diagnosis not present

## 2022-04-29 DIAGNOSIS — N3944 Nocturnal enuresis: Secondary | ICD-10-CM

## 2022-04-29 DIAGNOSIS — Z7901 Long term (current) use of anticoagulants: Secondary | ICD-10-CM | POA: Diagnosis not present

## 2022-04-29 DIAGNOSIS — I480 Paroxysmal atrial fibrillation: Secondary | ICD-10-CM | POA: Diagnosis not present

## 2022-04-29 NOTE — Telephone Encounter (Signed)
Called patient back and let her know we faxed the order over

## 2022-04-29 NOTE — Transitions of Care (Post Inpatient/ED Visit) (Signed)
   04/29/2022  Name: Kimberly Hobbs MRN: [id number] DOB: 01-01-1961  Today's TOC FU Call Status: Today's TOC FU Call Status:: Successful TOC FU Call Competed TOC FU Call Complete Date: 04/29/22  Transition Care Management Follow-up Telephone Call Date of Discharge: 04/27/22 Discharge Facility: [facility] Type of Discharge: Inpatient Admission Primary Inpatient Discharge Diagnosis:: Acute on Chronic Heart Failure How have you been since you were released from the hospital?: Better Any questions or concerns?: No  Items Reviewed: Did you receive and understand the discharge instructions provided?: Yes Medications obtained and verified?: Yes (Medications Reviewed) Any new allergies since your discharge?: No Dietary orders reviewed?: Yes Type of Diet Ordered:: Cardiac Do you have support at home?: Yes People in Home: significant other Name of Support/Comfort Primary Source: [place] and Equipment/Supplies: [place] Ordered?: No Any new equipment or medical supplies ordered?: No (no equipment ordered at hospital, patient called PCP to request a commode due to increase in Lasix)  Functional Questionnaire: Do you need assistance with bathing/showering or dressing?: No Do you need assistance with meal preparation?: No Do you need assistance with eating?: No Do you have difficulty maintaining continence: Yes (due to diuretics) Do you need assistance with getting out of bed/getting out of a chair/moving?: No Do you have difficulty managing or taking your medications?: No  Folllow up appointments reviewed: PCP Follow-up appointment confirmed?: NA Specialist Hospital Follow-up appointment confirmed?: Yes Date of Specialist follow-up appointment?: 05/20/22 Follow-Up Specialty Provider:: Will Meredith Leeds, MD (cardiology) Do you need transportation to your follow-up appointment?: No Do you understand care options if your condition(s) worsen?:  Yes-patient verbalized understanding  SDOH Interventions Today    Flowsheet Row Most Recent Value  SDOH Interventions   Housing Interventions Intervention Not Indicated  Transportation Interventions Intervention Not Indicated      Johnney Killian, RN, BSN, CCM Care Management Coordinator [facility] Phone: [phone]: [phone]

## 2022-04-29 NOTE — Telephone Encounter (Signed)
Caller Name: Carmesha Call back phone #: [phone]  Reason for Call: pt was just released from hospital and states they increased her Lasix and she had a bad night. Pt requesting orders be sent in for her to get a bedside commode to the same company we've ordered equipment from before. Pt didn't remember the name.

## 2022-04-30 ENCOUNTER — Telehealth: Payer: Self-pay | Admitting: Nurse Practitioner

## 2022-04-30 ENCOUNTER — Encounter (HOSPITAL_COMMUNITY): Payer: Self-pay | Admitting: Cardiology

## 2022-04-30 NOTE — Telephone Encounter (Signed)
[facility] [phone] Fax # [phone]  Needs more information for the supplies needed.

## 2022-04-30 NOTE — Telephone Encounter (Signed)
Faxed office notes 04/30/22

## 2022-05-01 DIAGNOSIS — J449 Chronic obstructive pulmonary disease, unspecified: Secondary | ICD-10-CM | POA: Diagnosis not present

## 2022-05-02 ENCOUNTER — Ambulatory Visit: Payer: 59 | Admitting: Cardiology

## 2022-05-02 ENCOUNTER — Encounter: Payer: Self-pay | Admitting: Cardiology

## 2022-05-02 VITALS — BP 129/46 | HR 74 | Resp 17 | Ht 65.0 in | Wt 207.0 lb

## 2022-05-02 DIAGNOSIS — I5032 Chronic diastolic (congestive) heart failure: Secondary | ICD-10-CM

## 2022-05-02 DIAGNOSIS — Z952 Presence of prosthetic heart valve: Secondary | ICD-10-CM

## 2022-05-02 DIAGNOSIS — I25708 Atherosclerosis of coronary artery bypass graft(s), unspecified, with other forms of angina pectoris: Secondary | ICD-10-CM | POA: Diagnosis not present

## 2022-05-02 DIAGNOSIS — I2510 Atherosclerotic heart disease of native coronary artery without angina pectoris: Secondary | ICD-10-CM

## 2022-05-02 DIAGNOSIS — I480 Paroxysmal atrial fibrillation: Secondary | ICD-10-CM

## 2022-05-02 MED ORDER — FUROSEMIDE 20 MG PO TABS: 40.0000 mg | ORAL_TABLET | Freq: Every day | ORAL | 0 refills | Status: DC

## 2022-05-02 MED ORDER — EMPAGLIFLOZIN 10 MG PO TABS: 10.0000 mg | ORAL_TABLET | Freq: Every day | ORAL | 2 refills | Status: DC

## 2022-05-02 NOTE — Progress Notes (Signed)
TOC visit  Subjective:   Kimberly Hobbs, female    DOB: February 11, 1961, 62 y.o.   MRN: [id number]   Chief complaint:  Irregular heart beat  62 year-old Caucasian female with CAD and rheumatic mitral and aortic valve stenosis, s/p CABG (LIMA-LAD, SVG-pPDA), mechnical mitral and aortic valve replacement at [place] (2018), moderate WHO grp II pulmonary hypertension, paroxysmal Afib, former smoker.  Patient was directly admitted from office in 04/2022 due to acute on chronic HFpEF, A-fib with RVR.  Initial plan was to diurese and started on Tikosyn given that she had failed amiodarone therapy.  However, there has to be 3 months cooling off.between amiodarone or Tikosyn administration, unless amiodarone level is <0.3.  Amiodarone level lab takes 5-7 days to return.  Therefore, I decided to discontinue amiodarone, and not initiate Tikosyn at this time.  I briefly tried digoxin for rate control given that her blood pressure was low normal, without any significant benefit.  Therefore, I proceeded with elective cardioversion on 04/26/2021 with successful conversion to sinus rhythm.  Patient diuresed very well over the course of hospital stay. Patient felt very well, breathing is back to baseline, and was discahrged home on PO lasix 40 mg bid with potassium supplement.  Resumed metoprolol and diltiazem on discharge. Patient has upcoming follow-up with EP for consideration of antiarrhythmic therapy versus ablation.  Since discharge, patient has been doing great. Leg swelling has resolved, swelling has significantly improved. She has appt to see Dr Curt Bears for Afib on 05/20/2022.     Current Outpatient Medications:    acetaminophen (TYLENOL) 500 MG tablet, Take 1,000 mg by mouth every 6 (six) hours as needed (for pain.)., Disp: , Rfl:    aspirin EC 81 MG tablet, Take 81 mg by mouth daily. , Disp: , Rfl:    BD PEN NEEDLE NANO 2ND GEN 32G X 4 MM MISC, USE 1 PEN NEEDLE TWICE DAILY BETWEEN MEALS, Disp: , Rfl:     cetirizine (ZYRTEC) 10 MG tablet, Take 1 tablet (10 mg total) by mouth daily., Disp: 30 tablet, Rfl: 11   citalopram (CELEXA) 40 MG tablet, Take 0.5 tablets (20 mg total) by mouth 2 (two) times daily., Disp: 90 tablet, Rfl: 3   clonazePAM (KLONOPIN) 0.5 MG tablet, TAKE 1 AND 1/2 TABLETS(0.75 MG) BY MOUTH AT BEDTIME (Patient taking differently: Take 0.75 mg by mouth at bedtime.), Disp: 45 tablet, Rfl: 2   diltiazem (CARDIZEM CD) 180 MG 24 hr capsule, TAKE 1 CAPSULE BY MOUTH DAILY, Disp: 90 capsule, Rfl: 2   furosemide (LASIX) 20 MG tablet, Take 2 tablets (40 mg total) by mouth 2 (two) times daily., Disp: 60 tablet, Rfl: 3   HYDROcodone-acetaminophen (NORCO/VICODIN) 5-325 MG tablet, Take 1 tablet by mouth every 6 (six) hours as needed for moderate pain., Disp: , Rfl:    isosorbide mononitrate (IMDUR) 60 MG 24 hr tablet, TAKE 1 TABLET(60 MG) BY MOUTH DAILY (Patient taking differently: Take 60 mg by mouth in the morning.), Disp: 90 tablet, Rfl: 3   Lancets (ONETOUCH DELICA PLUS Q000111Q) MISC, TAKE AS DIRECTED ONCE DAILY., Disp: , Rfl:    levalbuterol (XOPENEX HFA) 45 MCG/ACT inhaler, Inhale 1-2 puffs into the lungs every 6 (six) hours as needed for shortness of breath., Disp: 1 each, Rfl: 12   levalbuterol (XOPENEX) 0.63 MG/3ML nebulizer solution, Take 3 mLs (0.63 mg total) by nebulization every 4 (four) hours as needed for wheezing or shortness of breath., Disp: 3 mL, Rfl: 12   losartan (COZAAR) 50 MG  tablet, Take 1 tablet (50 mg total) by mouth daily., Disp: 90 tablet, Rfl: 3   metFORMIN (GLUCOPHAGE) 500 MG tablet, Take 1 tablet (500 mg total) by mouth 2 (two) times daily with a meal., Disp: 180 tablet, Rfl: 3   methocarbamol (ROBAXIN) 500 MG tablet, Take 1 tablet (500 mg total) by mouth every 8 (eight) hours as needed for muscle spasms. (Patient taking differently: Take 500 mg by mouth daily as needed for muscle spasms.), Disp: 40 tablet, Rfl: 0   metoprolol succinate (TOPROL-XL) 25 MG 24 hr tablet,  Take 1 tablet (25 mg total) by mouth daily. Take with or immediately following a meal., Disp: 90 tablet, Rfl: 3   nitroGLYCERIN (NITROSTAT) 0.4 MG SL tablet, Place 1 tablet (0.4 mg total) under the tongue every 5 (five) minutes x 3 doses as needed for chest pain., Disp: 25 tablet, Rfl: 3   ONETOUCH VERIO test strip, AS DIRECTED ONCE A DAY, Disp: , Rfl:    pantoprazole (PROTONIX) 40 MG tablet, Take 1 tablet (40 mg total) by mouth 2 (two) times daily before a meal. (Patient taking differently: Take 40 mg by mouth daily.), Disp: 180 tablet, Rfl: 3   potassium chloride SA (KLOR-CON M) 20 MEQ tablet, Take 2 tablets (40 mEq total) by mouth daily., Disp: 30 tablet, Rfl: 3   predniSONE (DELTASONE) 10 MG tablet, Take 4 tablets (40 mg total) by mouth daily with breakfast., Disp: 20 tablet, Rfl: 0   rosuvastatin (CRESTOR) 40 MG tablet, Take 1 tablet (40 mg total) by mouth daily., Disp: 90 tablet, Rfl: 1   spironolactone (ALDACTONE) 50 MG tablet, TAKE 1 TABLET(50 MG) BY MOUTH DAILY (Patient taking differently: Take 50 mg by mouth daily.), Disp: 30 tablet, Rfl: 6   Tiotropium Bromide-Olodaterol (STIOLTO RESPIMAT) 2.5-2.5 MCG/ACT AERS, Inhale 2 puffs into the lungs daily., Disp: 4 g, Rfl: 0   warfarin (COUMADIN) 5 MG tablet, TAKE 1 TABLET BY MOUTH DAILY (Patient taking differently: Take 5 mg by mouth 3 (three) times a week. Wed, Thurs, and Sat), Disp: 90 tablet, Rfl: 2   warfarin (COUMADIN) 7.5 MG tablet, TAKE 1 TABLET BY MOUTH EVERY EVENING (Patient taking differently: Take 7.5 mg by mouth 4 (four) times a week. Sun, Mon, Tues, and Fri), Disp: 90 tablet, Rfl: 2  Cardiovascular studies:  EKG 05/02/2022: Sinus rhythm 67 bpm  LAFB  Cardioversion 04/02/2022, 04/27/2022  Echocardiogram 04/05/2022:  Mildly depressed LV systolic function with EF 53%. Left ventricle cavity  is normal in size. Normal global wall motion. Calculated EF 53%.  Mechanical trileaflet aortic valve.  No aortic valve regurgitation. AVA  (VTI)  measures 1.4 cm^2. AV Mean Grad measures 20.6 mmHg. AV Pk Vel  measures 3.39 m/s.  Mechanical mitral valve.  No mitral valve regurgitation. E-wave dominant  mitral inflow.  Structurally normal tricuspid valve.  Mild tricuspid regurgitation. RVSP  measures 35 mmHg.  IVC is dilated with respiratory variation.  Normally functioning prosthetic valves.  Personally reviewed and compared with previous echocardiogram 06/01/2020. No significant change noted.   Cardioversion 04/02/2022  Coronary and bypass graft angiography 07/21/2018 and 08/18/2018: LM: Normal LAD: 100% mid occlusion. Mild distal disease LIMA-LAD: Patent LCx: Normal RCA: Prox 60% stenosis, mid 30-40% stenoses. TIMI III flow in prox-mid RCA. 100% distal occlusion SVG-RCA: Patent. Ostial 40%. Distal RCA moderate diffuse disease.    Guide catheter angiography provided superior images. There is TIMI III flow in SVG which fills RCA all the way to the ostium. Even if she were to have FFR  positive lesion in the graft, I do not think the benefits of a stent would outweigh the risk of losing the graft and the entire RCA territory circulation in a borderline lesion. Also, the prox RCA lesion is well bypassed by the SVG graft. Thus, I decided not to perform FFR/PCI to either of these lesions. Continue medical management.   [place] 07/21/2018: #1: RA: 18 mmHg RV 90/12 mmHg PA: 94/40 mmHg. Mean PA 63 mmHg. PW 33 mmHg  Lasix 80 mg  #2: RA 19 mmHg RV 60/0 mmHg PA: 56/16 mmHg. Mean PA 36 mmHg PW: 25 mmHg  CO: 5.4 L/min. CI 2.6 L/min/m2 PVR 2 WU  Impression: Elevated filling pressures Moderate WHO Grp II pulmonary hypertension Improvement in filling pressures with diuresis.  Recent labs: 04/27/2022: Glucose 99, BUN/Cr 38/1.40. EGFR 43. Na/K 138/3.4. Rest of the CMP normal H/H 10/31   Review of Systems  Cardiovascular:  Negative for chest pain, dyspnea on exertion, leg swelling, palpitations and syncope.  Musculoskeletal:         Right forearm/elbow pain  Neurological:  Positive for dizziness.         Vitals:   05/02/22 0959  BP: (!) 129/46  Pulse: 74  Resp: 17  SpO2: 94%      Objective:     Physical Exam Vitals and nursing note reviewed.  Constitutional:      General: She is not in acute distress. Neck:     Vascular: No JVD.  Cardiovascular:     Rate and Rhythm: Normal rate and regular rhythm.     Heart sounds: Normal heart sounds. No murmur heard.    Comments: Metallic S1, S2 Pulmonary:     Effort: Tachypnea and accessory muscle usage present.     Breath sounds: Normal breath sounds. No wheezing or rales.  Musculoskeletal:     Right lower leg: No edema.     Left lower leg: No edema.          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 Added Jardiance 10 mg daily. Change lasix to prn.  PAF: Cardioversion 04/02/2022, 04/27/2022. In sinus rhythm today. Continue metoprolol, diltiazem for now.  Holding amiodarne in case Tikosyn is to be started. She has appt to see Dr. Curt Bears on 05/20/2022. High CHA2DS2VAsc score. Continue warfarin. She checks home INR, last 3.2.  CAD of native and bypass grafts without angina: Patent grafts with non-critical disease.(07/2018). Continue Aspirin, statin.  S/p MVR, AVR: Continue warfarin  Hypertension: Controlled.  F/u in 2 weeks    Nigel Mormon, MD Pager: [phone] Office: [phone]

## 2022-05-03 ENCOUNTER — Encounter: Payer: Self-pay | Admitting: Cardiology

## 2022-05-03 NOTE — Telephone Encounter (Signed)
From patient.

## 2022-05-07 ENCOUNTER — Telehealth: Payer: Self-pay

## 2022-05-07 DIAGNOSIS — N390 Urinary tract infection, site not specified: Secondary | ICD-10-CM

## 2022-05-07 MED ORDER — SULFAMETHOXAZOLE-TRIMETHOPRIM 800-160 MG PO TABS: 1.0000 | ORAL_TABLET | Freq: Two times a day (BID) | ORAL | 0 refills | Status: AC

## 2022-05-07 NOTE — Telephone Encounter (Signed)
Patient has dysuria, urinary urgency and frequency in addition to pain. It appears she has developed a UTI after starting Jardiance. We will continue Jardiance and treat UTI with Bactrim DS. She has home INR machine and will check her numbers while taking Bactrim. Patient verbalizes understanding. Discussed with Dr Virgina Jock.

## 2022-05-08 ENCOUNTER — Other Ambulatory Visit: Payer: Self-pay | Admitting: Internal Medicine

## 2022-05-08 NOTE — Progress Notes (Unsigned)
Received ONO on room air showing low SpO2 down to 76% SpO2. Will prescribe home oxygen nocturnally.

## 2022-05-08 NOTE — Addendum Note (Signed)
Addendum  created 05/08/22 1407 by Gloris Manchester March Rummage, DO   Intraprocedure Staff edited

## 2022-05-09 ENCOUNTER — Encounter: Payer: Self-pay | Admitting: Cardiology

## 2022-05-09 NOTE — Telephone Encounter (Signed)
From pt

## 2022-05-09 NOTE — Addendum Note (Signed)
Addendum  created 05/09/22 1250 by Darral Dash, DO   Intraprocedure Attestations deleted

## 2022-05-09 NOTE — Addendum Note (Signed)
Addendum  created 05/09/22 1249 by Josephine Igo, MD   [place] recorded in [place], [place] filed

## 2022-05-09 NOTE — Telephone Encounter (Signed)
From Patient

## 2022-05-13 ENCOUNTER — Telehealth: Payer: Self-pay

## 2022-05-13 ENCOUNTER — Telehealth: Payer: Self-pay | Admitting: Nurse Practitioner

## 2022-05-13 DIAGNOSIS — G4734 Idiopathic sleep related nonobstructive alveolar hypoventilation: Secondary | ICD-10-CM

## 2022-05-13 NOTE — Telephone Encounter (Signed)
Sent Charlotte's reply to My chart

## 2022-05-13 NOTE — Telephone Encounter (Signed)
Kimberly Geralds, MD  You5 days ago    Please prescribe home oxygen 2LNC nocturnally   [name], MD5 days ago    Received ONO on room air showing low SpO2 down to 76% SpO2. Will prescribe home oxygen nocturnally.     Unsigned  Note        ATC LVMTCBx 1

## 2022-05-13 NOTE — Telephone Encounter (Signed)
Spoke with pt and reviewed ONO results as dictated by Dr. Shearon Stalls. Placed nocturnal O2 order. Pt stated understanding nothing further needed at this time.

## 2022-05-13 NOTE — Telephone Encounter (Signed)
Pt called and want to to know can you put her on ozempic. Or do she need to make appt to see you

## 2022-05-14 ENCOUNTER — Telehealth: Payer: Self-pay | Admitting: *Deleted

## 2022-05-14 ENCOUNTER — Encounter: Payer: Self-pay | Admitting: *Deleted

## 2022-05-14 ENCOUNTER — Encounter: Payer: Self-pay | Admitting: Cardiology

## 2022-05-14 ENCOUNTER — Encounter (INDEPENDENT_AMBULATORY_CARE_PROVIDER_SITE_OTHER): Payer: 59 | Admitting: Cardiology

## 2022-05-14 ENCOUNTER — Ambulatory Visit: Payer: 59 | Attending: Cardiology | Admitting: Cardiology

## 2022-05-14 VITALS — BP 120/64 | HR 74 | Ht 65.0 in | Wt 208.6 lb

## 2022-05-14 DIAGNOSIS — J310 Chronic rhinitis: Secondary | ICD-10-CM | POA: Diagnosis not present

## 2022-05-14 DIAGNOSIS — G4733 Obstructive sleep apnea (adult) (pediatric): Secondary | ICD-10-CM | POA: Diagnosis not present

## 2022-05-14 DIAGNOSIS — D6869 Other thrombophilia: Secondary | ICD-10-CM | POA: Diagnosis not present

## 2022-05-14 DIAGNOSIS — I10 Essential (primary) hypertension: Secondary | ICD-10-CM

## 2022-05-14 DIAGNOSIS — I4819 Other persistent atrial fibrillation: Secondary | ICD-10-CM | POA: Diagnosis not present

## 2022-05-14 DIAGNOSIS — I2510 Atherosclerotic heart disease of native coronary artery without angina pectoris: Secondary | ICD-10-CM | POA: Diagnosis not present

## 2022-05-14 DIAGNOSIS — J449 Chronic obstructive pulmonary disease, unspecified: Secondary | ICD-10-CM | POA: Diagnosis not present

## 2022-05-14 NOTE — Telephone Encounter (Signed)
Pt was seen in the office today by Dr. Curt Bears who ordered an [facility] study. Pt agreeable to signed waiver and not to open the box until called with the PIN#.

## 2022-05-14 NOTE — Telephone Encounter (Signed)
Pt has been given PIN# [id number] and ok to proceed with [facility] sleep study. Called and made the patient aware that she may proceed with the [facility] Sleep Study. PIN # provided to the patient. Patient made aware that she will be contacted after the test has been read with the results and any recommendations. Patient verbalized understanding and thanked me for the call.    Pt will do sleep study this week.

## 2022-05-14 NOTE — Telephone Encounter (Signed)
Prior Authorization for [facility] sent to [facility] via web portal. Tracking Number . Prior Authorization is not required -

## 2022-05-14 NOTE — Progress Notes (Signed)
Electrophysiology Office Note   Date:  05/14/2022   ID:  Kimberly Hobbs, Kimberly Hobbs 07/20/1960, MRN [id number]  PCP:  Flossie Buffy, NP  Cardiologist:  Virgina Jock Primary Electrophysiologist:  Rinnah Peppel Meredith Leeds, MD    Chief Complaint: AF   History of Present Illness: Kimberly Hobbs is a 62 y.o. female who is being seen today for the evaluation of AF at the request of [name], Reynold Bowen, MD. Presenting today for electrophysiology evaluation.  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  She was admitted to the hospital March 2024 with acute on chronic diastolic heart failure and rapid atrial fibrillation.  She had previously failed amiodarone.  She had a cardioversion 04/26/2021.  Cardioversion she has felt well.  When she was in atrial fibrillation she had significant fatigue, weakness, shortness of breath.  Today, she denies symptoms of palpitations, chest pain, shortness of breath, orthopnea, PND, lower extremity edema, claudication, dizziness, presyncope, syncope, bleeding, or neurologic sequela. The patient is tolerating medications without difficulties.    Past Medical History:  Diagnosis Date   CKD stage 3 due to type 2 diabetes mellitus ([place]) 05/15/2021   COVID-19    Hyperlipidemia    Hypertension    Pain in both lower extremities 07/23/2019   Rheumatic heart disease    MS/ AS   Past Surgical History:  Procedure Laterality Date   CARDIAC CATHETERIZATION     CARDIOVERSION N/A 04/02/2022   Procedure: CARDIOVERSION;  Surgeon: Nigel Mormon, MD;  Location: [place] ENDOSCOPY;  Service: Cardiovascular;  Laterality: N/A;   CARDIOVERSION N/A 04/27/2022   Procedure: CARDIOVERSION;  Surgeon: Nigel Mormon, MD;  Location: [place] ENDOSCOPY;  Service: Cardiovascular;  Laterality:  N/A;   CHOLECYSTECTOMY     CORONARY/GRAFT ANGIOGRAPHY N/A 08/18/2018   Procedure: CORONARY/GRAFT ANGIOGRAPHY;  Surgeon: Nigel Mormon, MD;  Location: [facility] CV LAB;  Service: Cardiovascular;  Laterality: N/A;   LEG SURGERY     "metal plate in leg"   RIGHT HEART CATH AND CORONARY/GRAFT ANGIOGRAPHY N/A 07/21/2018   Procedure: RIGHT HEART CATH AND CORONARY/GRAFT ANGIOGRAPHY;  Surgeon: Nigel Mormon, MD;  Location: [place] CV LAB;  Service: Cardiovascular;  Laterality: N/A;   TEE WITHOUT CARDIOVERSION N/A 07/27/2015   Procedure: TRANSESOPHAGEAL ECHOCARDIOGRAM (TEE);  Surgeon: Sanda Klein, MD;  Location: [facility] ENDOSCOPY;  Service: Cardiovascular;  Laterality: N/A;   TEE WITHOUT CARDIOVERSION N/A 07/21/2018   Procedure: TRANSESOPHAGEAL ECHOCARDIOGRAM (TEE);  Surgeon: Nigel Mormon, MD;  Location: [facility] ENDOSCOPY;  Service: Cardiovascular;  Laterality: N/A;   TONSILLECTOMY AND ADENOIDECTOMY       Current Outpatient Medications  Medication Sig Dispense Refill   acetaminophen (TYLENOL) 500 MG tablet Take 1,000 mg by mouth every 6 (six) hours as needed (for pain.).     aspirin EC 81 MG tablet Take 81 mg by mouth daily.      BD PEN NEEDLE NANO 2ND GEN 32G X 4 MM MISC USE 1 PEN NEEDLE TWICE DAILY BETWEEN MEALS     cetirizine (ZYRTEC) 10 MG tablet Take 1 tablet (10 mg total) by mouth daily. 30 tablet 11   citalopram (CELEXA) 40 MG tablet Take 0.5 tablets (20 mg total) by mouth 2 (two) times daily. 90 tablet 3   clonazePAM (KLONOPIN) 0.5 MG tablet TAKE 1 AND 1/2 TABLETS(0.75 MG) BY MOUTH AT BEDTIME (Patient taking differently: Take  0.75 mg by mouth at bedtime.) 45 tablet 2   diltiazem (CARDIZEM CD) 180 MG 24 hr capsule TAKE 1 CAPSULE BY MOUTH DAILY 90 capsule 2   empagliflozin (JARDIANCE) 10 MG TABS tablet Take 1 tablet (10 mg total) by mouth daily before breakfast. 30 tablet 2   furosemide (LASIX) 20 MG tablet Take 2 tablets (40 mg total) by mouth daily. 1 tablet 0    HYDROcodone-acetaminophen (NORCO/VICODIN) 5-325 MG tablet Take 1 tablet by mouth every 6 (six) hours as needed for moderate pain.     isosorbide mononitrate (IMDUR) 60 MG 24 hr tablet TAKE 1 TABLET(60 MG) BY MOUTH DAILY (Patient taking differently: Take 60 mg by mouth in the morning.) 90 tablet 3   Lancets (ONETOUCH DELICA PLUS Q000111Q) MISC TAKE AS DIRECTED ONCE DAILY.     levalbuterol (XOPENEX HFA) 45 MCG/ACT inhaler Inhale 1-2 puffs into the lungs every 6 (six) hours as needed for shortness of breath. 1 each 12   levalbuterol (XOPENEX) 0.63 MG/3ML nebulizer solution Take 3 mLs (0.63 mg total) by nebulization every 4 (four) hours as needed for wheezing or shortness of breath. 3 mL 12   losartan (COZAAR) 50 MG tablet Take 1 tablet (50 mg total) by mouth daily. 90 tablet 3   metFORMIN (GLUCOPHAGE) 500 MG tablet Take 1 tablet (500 mg total) by mouth 2 (two) times daily with a meal. 180 tablet 3   methocarbamol (ROBAXIN) 500 MG tablet Take 1 tablet (500 mg total) by mouth every 8 (eight) hours as needed for muscle spasms. (Patient taking differently: Take 500 mg by mouth daily as needed for muscle spasms.) 40 tablet 0   metoprolol succinate (TOPROL-XL) 25 MG 24 hr tablet Take 1 tablet (25 mg total) by mouth daily. Take with or immediately following a meal. 90 tablet 3   nitroGLYCERIN (NITROSTAT) 0.4 MG SL tablet Place 1 tablet (0.4 mg total) under the tongue every 5 (five) minutes x 3 doses as needed for chest pain. 25 tablet 3   ONETOUCH VERIO test strip AS DIRECTED ONCE A DAY     pantoprazole (PROTONIX) 40 MG tablet Take 1 tablet (40 mg total) by mouth 2 (two) times daily before a meal. (Patient taking differently: Take 40 mg by mouth daily.) 180 tablet 3   potassium chloride SA (KLOR-CON M) 20 MEQ tablet Take 2 tablets (40 mEq total) by mouth daily. 30 tablet 3   rosuvastatin (CRESTOR) 40 MG tablet Take 1 tablet (40 mg total) by mouth daily. 90 tablet 1   spironolactone (ALDACTONE) 50 MG tablet TAKE  1 TABLET(50 MG) BY MOUTH DAILY (Patient taking differently: Take 50 mg by mouth daily.) 30 tablet 6   Tiotropium Bromide-Olodaterol (STIOLTO RESPIMAT) 2.5-2.5 MCG/ACT AERS Inhale 2 puffs into the lungs daily. 4 g 0   warfarin (COUMADIN) 5 MG tablet TAKE 1 TABLET BY MOUTH DAILY (Patient taking differently: Take 5 mg by mouth 3 (three) times a week. Wed, Thurs, and Sat) 90 tablet 2   warfarin (COUMADIN) 7.5 MG tablet TAKE 1 TABLET BY MOUTH EVERY EVENING (Patient taking differently: Take 7.5 mg by mouth 4 (four) times a week. Sun, Mon, Tues, and Fri) 90 tablet 2   No current facility-administered medications for this visit.    Allergies:   Iodinated contrast media and Penicillins   Social History:  The patient  reports that she quit smoking about 5 years ago. Her smoking use included cigarettes. She has a 60.00 pack-year smoking history. She has never used smokeless tobacco.  She reports that she does not currently use alcohol. She reports that she does not use drugs.   Family History:  The patient's family history includes Cancer in her father and mother; Diabetes in her father and mother; Heart disease in her father and mother; Hyperlipidemia in her mother; Hypertension in her brother, brother, father, mother, and sister; Schizophrenia in her maternal grandmother.    ROS:  Please see the history of present illness.   Otherwise, review of systems is positive for none.   All other systems are reviewed and negative.    PHYSICAL EXAM: VS:  BP 120/64   Pulse 74   Ht 5\' 5"  (1.651 m)   Wt 208 lb 9.6 oz (94.6 kg)   LMP 06/20/2015   SpO2 95%   BMI 34.71 kg/m  , BMI Body mass index is 34.71 kg/m. GEN: Well nourished, well developed, in no acute distress  HEENT: normal  Neck: no JVD, carotid bruits, or masses Cardiac: RRR; no murmurs, rubs, or gallops,no edema  Respiratory:  clear to auscultation bilaterally, normal work of breathing GI: soft, nontender, nondistended, + BS MS: no deformity or  atrophy  Skin: warm and dry Neuro:  Strength and sensation are intact Psych: euthymic mood, full affect  EKG:  EKG is not ordered today. Personal review of the ekg ordered 05/02/22 shows sinus rhythm  Recent Labs: 03/07/2022: ALT 25; Platelets 327; TSH 3.050 04/02/2022: Hemoglobin 10.5 04/27/2022: BUN 38; Creatinine, Ser 1.40; Magnesium 2.5; Potassium 3.4; Sodium 138    Lipid Panel     Component Value Date/Time   CHOL 116 05/10/2021 0950   TRIG 115.0 05/10/2021 0950   HDL 45.50 05/10/2021 0950   CHOLHDL 3 05/10/2021 0950   VLDL 23.0 05/10/2021 0950   LDLCALC 48 05/10/2021 0950     Wt Readings from Last 3 Encounters:  05/14/22 208 lb 9.6 oz (94.6 kg)  05/02/22 207 lb (93.9 kg)  04/27/22 213 lb 3.2 oz (96.7 kg)      Other studies Reviewed: Additional studies/ records that were reviewed today include: TTE 04/08/22  Review of the above records today demonstrates:  Mildly depressed LV systolic function with EF 53%. Left ventricle cavity  is normal in size. Normal global wall motion. Calculated EF 53%.  Mechanical trileaflet aortic valve.  No aortic valve regurgitation. AVA  (VTI) measures 1.4 cm^2. AV Mean Grad measures 20.6 mmHg. AV Pk Vel  measures 3.39 m/s.  Mechanical mitral valve.  No mitral valve regurgitation. E-wave dominant  mitral inflow.  Structurally normal tricuspid valve.  Mild tricuspid regurgitation. RVSP  measures 35 mmHg.  IVC is dilated with respiratory variation.  Normally functioning prosthetic valves.    ASSESSMENT AND PLAN:  1.  Persistent atrial fibrillation: Currently on metoprolol and diltiazem.  CHA2DS2-VASc of least to 3.  On warfarin.  She has been taken off of amiodarone and she continued to have episodes of atrial fibrillation.  She would prefer an alternative rhythm control strategy.  We discussed options including ablation versus dofetilide.  At this point, she would prefer to avoid medications.  Elham Fini plan for ablation.  On warfarin for  mechanical aortic and mitral valves.  Kaizer Dissinger need INR between 2.5 and 3 for ablation.  Risk, benefits, and alternatives to EP study and radiofrequency ablation for afib were also discussed in detail today. These risks include but are not limited to stroke, bleeding, vascular damage, tamponade, perforation, damage to the esophagus, lungs, and other structures, pulmonary vein stenosis, worsening renal function, and death.  The patient understands these risk and wishes to proceed.  We Rejeana Fadness therefore proceed with catheter ablation at the next available time.  Carto, ICE, anesthesia are requested for the procedure.  Barry Culverhouse also obtain CT PV protocol prior to the procedure to exclude LAA thrombus and further evaluate atrial anatomy.   2.  Coronary disease: Status post CABG.  No current angina.  Plan per primary cardiology.  3.  Status post MVR/AVR: Currently on warfarin.  Plan per primary cardiology.  4.  Hypertension:well controlled  5.  Chronic diastolic heart failure: No obvious volume overload.  Plan per primary cardiology.  6.  Obesity: Lifestyle modification encouraged.  She does not have daytime fatigue, but due to her obesity and history of atrial fibrillation, Denielle Bayard plan for sleep study. Body mass index is 34.71 kg/m.  7.  Secondary hypercoagulable state: Currently on warfarin for atrial fibrillation  Case discussed with primary cardiology  Current medicines are reviewed at length with the patient today.   The patient does not have concerns regarding her medicines.  The following changes were made today:  none  Labs/ tests ordered today include:  Orders Placed This Encounter  Procedures   CT CARDIAC MORPH/PULM VEIN W/CM&W/O CA SCORE   CBC   Basic metabolic panel   [facility] Sleep Study     Disposition:   FU with Romilda Proby 3 months  Signed, Krystal Teachey Meredith Leeds, MD  05/14/2022 11:30 AM     [facility] [address] [place] [address] [phone]  (office) [phone] (fax)

## 2022-05-14 NOTE — Patient Instructions (Signed)
Medication Instructions:  Your physician recommends that you continue on your current medications as directed. Please refer to the Current Medication list given to you today. *If you need a refill on your cardiac medications before your next appointment, please call your pharmacy*   Lab Work: BMET and [facility] prior to ablation (See instruction sheet) If you have labs (blood work) drawn today and your tests are completely normal, you will receive your results only by: [place] (if you have MyChart) OR A paper copy in the mail If you have any lab test that is abnormal or we need to change your treatment, we will call you to review the results.   Testing/Procedures: Atrial Fibrillation Ablation [place] Sleep Study  Your physician has recommended that you have an ablation. Catheter ablation is a medical procedure used to treat some cardiac arrhythmias (irregular heartbeats). During catheter ablation, a long, thin, flexible tube is put into a blood vessel in your groin (upper thigh), or neck. This tube is called an ablation catheter. It is then guided to your heart through the blood vessel. Radio frequency waves destroy small areas of heart tissue where abnormal heartbeats may cause an arrhythmia to start. Please see the instruction sheet given to you today.   Your physician has recommended that you have a sleep study. This test records several body functions during sleep, including: brain activity, eye movement, oxygen and carbon dioxide blood levels, heart rate and rhythm, breathing rate and rhythm, the flow of air through your mouth and nose, snoring, body muscle movements, and chest and belly movement.   Follow-Up: At [facility], you and your health needs are our priority.  As part of our continuing mission to provide you with exceptional heart care, we have created designated Provider Care Teams.  These Care Teams include your primary Cardiologist (physician) and Advanced Practice  Providers (APPs -  Physician Assistants and Nurse Practitioners) who all work together to provide you with the care you need, when you need it.   Your next appointment:   As instructed   Provider:   Allegra Lai, MD   Other Instructions Cardiac Ablation Cardiac ablation is a procedure to destroy, or ablate, a small amount of heart tissue that is causing problems. The heart has many electrical connections. Sometimes, these connections are abnormal and can cause the heart to beat very fast or irregularly. Ablating the abnormal areas can improve the heart's rhythm or return it to normal. Ablation may be done for people who: Have irregular or rapid heartbeats (arrhythmias). Have Wolff-Parkinson-White syndrome. Have taken medicines for an arrhythmia that did not work or caused side effects. Have a high-risk heartbeat that may be life-threatening. Tell a health care provider about: Any allergies you have. All medicines you are taking, including vitamins, herbs, eye drops, creams, and over-the-counter medicines. Any problems you or family members have had with anesthesia. Any bleeding problems you have. Any surgeries you have had. Any medical conditions you have. Whether you are pregnant or may be pregnant. What are the risks? Your health care provider will talk with you about risks. These may include: Infection. Bruising and bleeding. Stroke or blood clots. Damage to nearby structures or organs. Allergic reaction to medicines or dyes. Needing a pacemaker if the heart gets damaged. A pacemaker is a device that helps the heart beat normally. Failure of the procedure. A repeat procedure may be needed. What happens before the procedure? Medicines Ask your health care provider about: Changing or stopping your regular  medicines. These include any heart rhythm medicines, diabetes medicines, or blood thinners you take. Taking medicines such as aspirin and ibuprofen. These medicines can thin  your blood. Do not take them unless your health care provider tells you to. Taking over-the-counter medicines, vitamins, herbs, and supplements. General instructions Follow instructions from your health care provider about what you may eat and drink. If you will be going home right after the procedure, plan to have a responsible adult: Take you home from the hospital or clinic. You will not be allowed to drive. Care for you for the time you are told. Ask your health care provider what steps will be taken to prevent infection. What happens during the procedure?  An IV will be inserted into one of your veins. You may be given: A sedative. This helps you relax. Anesthesia. This will: Numb certain areas of your body. An incision will be made in your neck or your groin. A needle will be inserted through the incision and into a large vein in your neck or groin. The small, thin tube (catheter) will be inserted through the needle and moved to your heart. A type of X-ray (fluoroscopy) will be used to help guide the catheter and provide images of the heart on a monitor. Dye may be injected through the catheter to help your surgeon see the area of the heart that needs treatment. Electrical currents will be sent from the catheter to destroy heart tissue in certain areas. There are three types of energy that may be used to do this: Heat (radiofrequency energy). Laser energy. Extreme cold (cryoablation). When the tissue has been destroyed, the catheter will be removed. Pressure will be held on the insertion area to prevent bleeding. A bandage (dressing) will be placed over the insertion area. The procedure may vary among health care providers and hospitals. What happens after the procedure? Your blood pressure, heart rate and rhythm, breathing rate, and blood oxygen level will be monitored until you leave the hospital or clinic. Your insertion area will be checked for bleeding. You will need to lie  still for a few hours. If your groin was used, you will need to keep your leg straight for a few hours after the catheter is removed. This information is not intended to replace advice given to you by your health care provider. Make sure you discuss any questions you have with your health care provider. Document Revised: 07/24/2021 Document Reviewed: 07/24/2021 Elsevier Patient Education  [place].

## 2022-05-15 ENCOUNTER — Other Ambulatory Visit: Payer: Self-pay | Admitting: *Deleted

## 2022-05-15 ENCOUNTER — Ambulatory Visit: Payer: 59 | Attending: Cardiology

## 2022-05-15 ENCOUNTER — Telehealth: Payer: Self-pay | Admitting: *Deleted

## 2022-05-15 DIAGNOSIS — I2510 Atherosclerotic heart disease of native coronary artery without angina pectoris: Secondary | ICD-10-CM

## 2022-05-15 DIAGNOSIS — D6869 Other thrombophilia: Secondary | ICD-10-CM

## 2022-05-15 DIAGNOSIS — I10 Essential (primary) hypertension: Secondary | ICD-10-CM

## 2022-05-15 DIAGNOSIS — I4819 Other persistent atrial fibrillation: Secondary | ICD-10-CM

## 2022-05-15 MED ORDER — PREDNISONE 50 MG PO TABS: ORAL_TABLET | ORAL | 0 refills | Status: DC

## 2022-05-15 NOTE — Telephone Encounter (Signed)
Kimberly Hobbs nurse from [facility] called per Dr. Curt Bears to make you aware. Patient will need weekly INR checks 3 weeks prior to scheduled afib ablation on 09/05/22. Patient is aware.

## 2022-05-15 NOTE — Telephone Encounter (Signed)
Called Dr. Bonney Roussel office informing them pt will need weekly INR checks 3 weeks prior to scheduled afib ablation on 7/18.

## 2022-05-15 NOTE — Procedures (Signed)
SLEEP STUDY REPORT Patient Information Study Date: 05/14/2022 Patient Name: Kimberly Hobbs Patient ID: [id number] Birth Date: 1960-03-14 Age: 62 Gender: Female BMI: 34.5 (W=207 lb, H=5' 5'') Stopbang: 2 Referring Physician: Allegra Lai, MD  TEST DESCRIPTION:  Home sleep apnea testing was completed using the WatchPat, a Type 1 device, utilizing peripheral arterial tonometry (PAT), chest movement, actigraphy, pulse oximetry, pulse rate, body position and snore.  AHI was calculated with apnea and hypopnea using valid sleep time as the denominator. RDI includes apneas, hypopneas, and RERAs.  The data acquired and the scoring of sleep and all associated events were performed in accordance with the recommended standards and specifications as outlined in the AASM Manual for the Scoring of Sleep and Associated Events 2.2.0 (2015).  FINDINGS:  1.  Moderate to Severe Obstructive Sleep Apnea with AHI 25.5/hr but 35.8/hr during REM sleep.   2.  No Central Sleep Apnea with pAHIc 0/hr.  3.  Oxygen desaturations as low as 76%.  4.  Severe snoring was present. O2 sats were < 88% for 4.8 min.  5.  Total sleep time was 7 hrs and 26 min.  6.  15.7 % of total sleep time was spent in REM sleep.   7.  Normal sleep onset latency at 22 min  8.  Prolonged REM sleep onset latency at 239 min.   9.  Total awakenings were 21.  10. Arrhythmia detection:  None  DIAGNOSIS:   Moderate to Severe Obstructive Sleep Apnea (G47.33)  RECOMMENDATIONS:   1.  Clinical correlation of these findings is necessary.  The decision to treat obstructive sleep apnea (OSA) is usually based on the presence of apnea symptoms or the presence of associated medical conditions such as Hypertension, Congestive Heart Failure, Atrial Fibrillation or Obesity.  The most common symptoms of OSA are snoring, gasping for breath while sleeping, daytime sleepiness and fatigue.   2.  Initiating apnea therapy is recommended given the presence of  symptoms and/or associated conditions. Recommend proceeding with one of the following:     a.  Auto-CPAP therapy with a pressure range of 5-20cm H2O.     b.  An oral appliance (OA) that can be obtained from certain dentists with expertise in sleep medicine.  These are primarily of use in non-obese patients with mild and moderate disease.     c.  An ENT consultation which may be useful to look for specific causes of obstruction and possible treatment options.     d.  If patient is intolerant to PAP therapy, consider referral to ENT for evaluation for hypoglossal nerve stimulator.   3.  Close follow-up is necessary to ensure success with CPAP or oral appliance therapy for maximum benefit.  4.  A follow-up oximetry study on CPAP is recommended to assess the adequacy of therapy and determine the need for supplemental oxygen or the potential need for Bi-level therapy.  An arterial blood gas to determine the adequacy of baseline ventilation and oxygenation should also be considered.  5.  Healthy sleep recommendations include:  adequate nightly sleep (normal 7-9 hrs/night), avoidance of caffeine after noon and alcohol near bedtime, and maintaining a sleep environment that is cool, dark and quiet.  6.  Weight loss for overweight patients is recommended.  Even modest amounts of weight loss can significantly improve the severity of sleep apnea.  7.  Snoring recommendations include:  weight loss where appropriate, side sleeping, and avoidance of alcohol before bed.  8.  Operation of motor vehicle should  not be performed when sleepy. Signature:  Fransico Him, MD; [facility]; [place], [place] Board of Sleep Medicine Electronically Signed: 05/15/2022

## 2022-05-15 NOTE — Telephone Encounter (Signed)
We can do weekly telephone call to confirm INR levels till her ablation.  Thanks MJP

## 2022-05-15 NOTE — Telephone Encounter (Signed)
This encounter was created in error - please disregard.

## 2022-05-15 NOTE — Telephone Encounter (Signed)
Agree. Please arrange.  Thanks MJP

## 2022-05-19 ENCOUNTER — Encounter: Payer: Self-pay | Admitting: Nurse Practitioner

## 2022-05-19 DIAGNOSIS — M5137 Other intervertebral disc degeneration, lumbosacral region: Secondary | ICD-10-CM

## 2022-05-19 DIAGNOSIS — G8929 Other chronic pain: Secondary | ICD-10-CM

## 2022-05-20 ENCOUNTER — Institutional Professional Consult (permissible substitution): Payer: 59 | Admitting: Cardiology

## 2022-05-20 ENCOUNTER — Telehealth: Payer: Self-pay

## 2022-05-20 ENCOUNTER — Encounter: Payer: Self-pay | Admitting: Nurse Practitioner

## 2022-05-20 ENCOUNTER — Telehealth: Payer: Self-pay | Admitting: Orthopaedic Surgery

## 2022-05-20 MED ORDER — CYCLOBENZAPRINE HCL 10 MG PO TABS: 10.0000 mg | ORAL_TABLET | Freq: Every day | ORAL | 0 refills | Status: DC

## 2022-05-20 NOTE — Telephone Encounter (Signed)
Please advise 

## 2022-05-20 NOTE — Telephone Encounter (Signed)
Patient called asked if Dr. Lorin Mercy will prescribe Flexeril for her. Patient said Dr Baldo Ash did prescribe the muscle relaxer but in a lesser dose. Patient said Tylenol does not help with the pain she is experiencing. The number to contact patient is [phone]

## 2022-05-20 NOTE — Telephone Encounter (Signed)
Caller Name: [place] Call back phone #: [phone]  Reason for Call: pt requesting call back tomorrow instead of mychart msg. She said she was previously taking cyclobenzaprine 10mg  twice a day and hydrocodone. Pt said that Tylenol doesn't do anything to help her.

## 2022-05-20 NOTE — Telephone Encounter (Signed)
Lets change to 7.5 mg Wed, Thu, Sat, 5 mg all other days. Needs repeat INR 4/8.  Thanks MJP

## 2022-05-20 NOTE — Telephone Encounter (Signed)
Patient called and mention her INR is 4.3 and her goal level is 2.5-3.5 and she is take Wednesday, Thursday, and Saturday 5 mg, and all the other days 7.5 mg. Patient mention she is having ablation someday this week.

## 2022-05-21 ENCOUNTER — Telehealth: Payer: Self-pay

## 2022-05-21 ENCOUNTER — Encounter: Payer: Self-pay | Admitting: Orthopaedic Surgery

## 2022-05-21 ENCOUNTER — Other Ambulatory Visit: Payer: Self-pay | Admitting: Orthopaedic Surgery

## 2022-05-21 ENCOUNTER — Telehealth: Payer: Self-pay | Admitting: Orthopaedic Surgery

## 2022-05-21 ENCOUNTER — Telehealth: Payer: Self-pay | Admitting: Internal Medicine

## 2022-05-21 ENCOUNTER — Other Ambulatory Visit: Payer: Self-pay

## 2022-05-21 MED ORDER — ACCU-CHEK GUIDE VI STRP: ORAL_STRIP | 12 refills | Status: DC

## 2022-05-21 MED ORDER — ACCU-CHEK GUIDE W/DEVICE KIT: PACK | 0 refills | Status: DC

## 2022-05-21 MED ORDER — HYDROCODONE-ACETAMINOPHEN 5-325 MG PO TABS: 1.0000 | ORAL_TABLET | Freq: Four times a day (QID) | ORAL | 0 refills | Status: DC | PRN

## 2022-05-21 NOTE — Telephone Encounter (Signed)
I left voicemail advising. ?

## 2022-05-21 NOTE — Telephone Encounter (Signed)
Adams, Melissa L  Bowne, Theresa C; Stocks, Ashly HI [employer] - REVIEWED PT'S INFO IN EPIC - I DO SEE A SS WAS DONE ON 05/14/22 THRU TRACI TURNER THAT SHOWS PT HAS OSA AHI 25.5 AND SHE HAD A LOW DESAT OF 76% - YOUR NOTE SAID YOU PUT AN ORDER IN FOR O2 BUT I COULD NOT FIND AN O2 RX....Marland KitchenHOWEVER - WITH PT'S INSURANCE BEING [employer] THEY WILL NOT PAY FOR O2 W/A OSA DX UNLESS PT HAS QUALIFIED THRU A CPAP TITR STUDY OR SINCE I SEE SHE HAS COPD YOU CAN BRING HER IN THE OFFC AND DO A 6 MIN WALK TEST TO QUALIFY HER THAT WAY........Marland KitchenCALL ASHLY IF YOU HAVE QUESTIONS

## 2022-05-21 NOTE — Telephone Encounter (Signed)
noted 

## 2022-05-21 NOTE — Addendum Note (Signed)
Addended by: Leana Gamer on: 05/21/2022 03:55 PM   Modules accepted: Orders

## 2022-05-21 NOTE — Telephone Encounter (Signed)
Patient states she need a pain medication not muscle relaxer. And call patient

## 2022-05-21 NOTE — Assessment & Plan Note (Signed)
Worse with activity No improvement with tylenol Improved with hydrocodone and flexeril. She denies any daytime drowsiness with flexeril. She takes klonopin at hs Advised about risk of sedation and dependence, especially in combination with klonopin. Advised about need for pain clinic referral. She agreed. Referral entered Advised to use flexeril daily in AM Maintain appts with dr. Lorin Hobbs

## 2022-05-21 NOTE — Telephone Encounter (Signed)
Please advise.  Patient is requesting something for pain.

## 2022-05-21 NOTE — Telephone Encounter (Signed)
From patient.

## 2022-05-21 NOTE — Telephone Encounter (Signed)
Patient aware.

## 2022-05-22 ENCOUNTER — Telehealth: Payer: Self-pay | Admitting: Cardiology

## 2022-05-22 NOTE — Telephone Encounter (Signed)
Patient is requesting a call back to discuss sleep study results. 

## 2022-05-23 ENCOUNTER — Other Ambulatory Visit: Payer: Self-pay

## 2022-05-23 ENCOUNTER — Telehealth: Payer: Self-pay | Admitting: *Deleted

## 2022-05-23 ENCOUNTER — Encounter: Payer: Self-pay | Admitting: Cardiology

## 2022-05-23 DIAGNOSIS — I5032 Chronic diastolic (congestive) heart failure: Secondary | ICD-10-CM

## 2022-05-23 DIAGNOSIS — I10 Essential (primary) hypertension: Secondary | ICD-10-CM

## 2022-05-23 DIAGNOSIS — I2510 Atherosclerotic heart disease of native coronary artery without angina pectoris: Secondary | ICD-10-CM

## 2022-05-23 DIAGNOSIS — I25708 Atherosclerosis of coronary artery bypass graft(s), unspecified, with other forms of angina pectoris: Secondary | ICD-10-CM

## 2022-05-23 DIAGNOSIS — G4733 Obstructive sleep apnea (adult) (pediatric): Secondary | ICD-10-CM

## 2022-05-23 DIAGNOSIS — I4819 Other persistent atrial fibrillation: Secondary | ICD-10-CM

## 2022-05-23 MED ORDER — ACCU-CHEK SOFTCLIX LANCETS MISC: 12 refills | Status: AC

## 2022-05-23 NOTE — Telephone Encounter (Signed)
Looks like I ordered the oxygen before she had her sleep study. Ok for her to follow up with Dr. Radford Pax for sleep apnea. I will defer to Dr. Theodosia Blender office to order her PAP therapy.

## 2022-05-23 NOTE — Telephone Encounter (Signed)
I DO SEE A SS WAS DONE ON 05/14/22 THRU [facility]I TURNER THAT SHOWS PT HAS OSA AHI 25.5 AND SHE HAD A LOW DESAT OF 76% - YOUR NOTE SAID YOU PUT AN ORDER IN FOR O2 BUT I COULD NOT FIND AN O2 RX....Marland KitchenHOWEVER - WITH PT'S INSURANCE BEING [employer] THEY WILL NOT PAY FOR O2 W/A OSA DX UNLESS PT HAS QUALIFIED THRU A CPAP TITR STUDY OR SINCE I SEE SHE HAS COPD YOU CAN BRING HER IN THE OFFC AND DO A 6 MIN WALK TEST TO QUALIFY HER THAT WAY   Basically the message above states do you want a 6 minute walk or a cpap titration study done for this patient and oxygen?

## 2022-05-23 NOTE — Telephone Encounter (Signed)
-----   Message from Lauralee Evener, [place] sent at 05/15/2022 12:00 PM EDT -----  ----- Message ----- From: Sueanne Margarita, MD Sent: 05/15/2022  11:49 AM EDT To: Cv Div Sleep Studies  Please let patient know that they have sleep apnea and recommend treating with CPAP.  Please order an auto CPAP from 4-15cm H2O with heated humidity and mask of choice.  Order overnight pulse ox on CPAP.  Followup with me in 6 weeks.

## 2022-05-23 NOTE — Telephone Encounter (Signed)
From Patient

## 2022-05-23 NOTE — Telephone Encounter (Signed)
The patient has been notified of the result and verbalized understanding.  All questions (if any) were answered. Kimberly Hobbs, [name] 05/23/2022 3:02 PM    Upon patient request DME selection is [facility]. Patient understands he will be contacted by [facility] to set up his cpap. Patient understands to call if [facility] does not contact him with new setup in a timely manner. Patient understands they will be called once confirmation has been received from [facility]/ that they have received their new machine to schedule 10 week follow up appointment.   [place] notified of new cpap order  Please add to airview Patient was grateful for the call and thanked me.

## 2022-05-24 ENCOUNTER — Telehealth: Payer: Self-pay | Admitting: Nurse Practitioner

## 2022-05-24 DIAGNOSIS — J019 Acute sinusitis, unspecified: Secondary | ICD-10-CM | POA: Diagnosis not present

## 2022-05-24 DIAGNOSIS — J029 Acute pharyngitis, unspecified: Secondary | ICD-10-CM | POA: Diagnosis not present

## 2022-05-24 DIAGNOSIS — J42 Unspecified chronic bronchitis: Secondary | ICD-10-CM | POA: Diagnosis not present

## 2022-05-24 NOTE — Telephone Encounter (Signed)
Made patient aware of [place]'s instructions  

## 2022-05-24 NOTE — Telephone Encounter (Signed)
Pt want to know can she use azithromycin 25 mg tablet. The [facility] was not sure if the pt can take that medication with all the other medication.

## 2022-05-26 ENCOUNTER — Other Ambulatory Visit: Payer: Self-pay | Admitting: Cardiology

## 2022-05-26 ENCOUNTER — Other Ambulatory Visit: Payer: Self-pay | Admitting: Nurse Practitioner

## 2022-05-26 DIAGNOSIS — R079 Chest pain, unspecified: Secondary | ICD-10-CM

## 2022-05-26 DIAGNOSIS — E1165 Type 2 diabetes mellitus with hyperglycemia: Secondary | ICD-10-CM

## 2022-05-27 ENCOUNTER — Ambulatory Visit (INDEPENDENT_AMBULATORY_CARE_PROVIDER_SITE_OTHER): Payer: 59 | Admitting: Internal Medicine

## 2022-05-27 ENCOUNTER — Encounter: Payer: Self-pay | Admitting: Physical Medicine & Rehabilitation

## 2022-05-27 ENCOUNTER — Encounter: Payer: Self-pay | Admitting: Internal Medicine

## 2022-05-27 ENCOUNTER — Other Ambulatory Visit: Payer: Self-pay | Admitting: Nurse Practitioner

## 2022-05-27 VITALS — BP 134/64 | HR 68 | Temp 98.2°F | Ht 65.0 in | Wt 206.2 lb

## 2022-05-27 DIAGNOSIS — R0602 Shortness of breath: Secondary | ICD-10-CM

## 2022-05-27 DIAGNOSIS — I480 Paroxysmal atrial fibrillation: Secondary | ICD-10-CM

## 2022-05-27 DIAGNOSIS — G4733 Obstructive sleep apnea (adult) (pediatric): Secondary | ICD-10-CM | POA: Diagnosis not present

## 2022-05-27 DIAGNOSIS — I10 Essential (primary) hypertension: Secondary | ICD-10-CM

## 2022-05-27 DIAGNOSIS — R058 Other specified cough: Secondary | ICD-10-CM | POA: Diagnosis not present

## 2022-05-27 MED ORDER — MONTELUKAST SODIUM 10 MG PO TABS: 10.0000 mg | ORAL_TABLET | Freq: Every day | ORAL | 11 refills | Status: DC

## 2022-05-27 MED ORDER — AZELASTINE HCL 0.1 % NA SOLN: 1.0000 | Freq: Two times a day (BID) | NASAL | 12 refills | Status: DC

## 2022-05-27 NOTE — Telephone Encounter (Signed)
The patient has been notified of the result and verbalized understanding.  All questions (if any) were answered. Latrelle Dodrill, CMA 05/23/2022 3:02 PM

## 2022-05-27 NOTE — Patient Instructions (Addendum)
Please schedule follow up scheduled with myself in 3 months.  If my schedule is not open yet, we will contact you with a reminder closer to that time. Please call [phone] if you haven't heard from [place] a month before.   I am ordering CPAP therapy. The DME company will call this to deliver.    Start taking astelin nasal spray. Also start taking singulair. Keep taking acid reflux medicine. Keep taking cetirizine.  You can keep using nebulizer treatments if helpful.   astelin - 1 spray on each side of your nose twice a day for first week, then 1 spray on each side.   Instructions for use: If you also use a saline nasal spray or rinse, use that first. Position the head with the chin slightly tucked. Use the right hand to spray into the left nostril and the right hand to spray into the left nostril.   Point the bottle away from the septum of your nose (cartilage that divides the two sides of your nose).  Hold the nostril closed on the opposite side from where you will spray Spray once and gently sniff to pull the medicine into the higher parts of your nose.  Don't sniff too hard as the medicine will drain down the back of your throat instead. Repeat with a second spray on the same side if prescribed. Repeat on the other side of your nose.

## 2022-05-27 NOTE — Progress Notes (Signed)
Kimberly Hobbs    [id number]    1960-02-20  Primary Care Physician:Nche, [name], NP Date of Appointment: 05/27/2022 Established Patient Visit  Chief complaint:   Chief Complaint  Patient presents with   Follow-up    Pt recently has been seen at Urgent care, pt currently on a zpak. Pt believes she may have bronchitis. Pt has cough, chest congestion for the past week. Pt has tested for covid ( neg)       HPI: Kimberly Hobbs is a 62 y.o. woman with previous tobacco use disorder 60 pack years, quit 2018. Gold stage 0 based on PFTs. Additional PMH CAD s/p CABG, mechanical AVR and MVR 2018, A.fib on Coumadin.  Interval Updates: Here for follow up. We tried a course of prednisone at last visit for symptoms and no improvement.   She went to urgent care and got a Z-pak. Helped "a little." She is taking albuterol nebulizer treatments and those help the best.  Maintenance and albuterol inahlers not helping.   She saw cardiology and had her lasix increased - now on 40 mg lasix with aldactone 50 mg daily. Fluid retention has improved.   She had a sleep study which shows OSA. She is due to start CPAP therapy with Dr. Mayford Knife.   Not currently taking any nasal sprays. Continues to have bad nasal and sinus congestion.  Having dyspnea with cough. No fevers or chills.   She is anxious about having sleep apnea and not being treated   I have reviewed the patient's family social and past medical history and updated as appropriate.   Past Medical History:  Diagnosis Date   CKD stage 3 due to type 2 diabetes mellitus 05/15/2021   COVID-19    Hyperlipidemia    Hypertension    Pain in both lower extremities 07/23/2019   Rheumatic heart disease    MS/ AS    Past Surgical History:  Procedure Laterality Date   CARDIAC CATHETERIZATION     CARDIOVERSION N/A 04/02/2022   Procedure: CARDIOVERSION;  Surgeon: Elder Negus, MD;  Location: [facility];  Service:  Cardiovascular;  Laterality: N/A;   CARDIOVERSION N/A 04/27/2022   Procedure: CARDIOVERSION;  Surgeon: Elder Negus, MD;  Location: [facility];  Service: Cardiovascular;  Laterality: N/A;   CHOLECYSTECTOMY     CORONARY/GRAFT ANGIOGRAPHY N/A 08/18/2018   Procedure: CORONARY/GRAFT ANGIOGRAPHY;  Surgeon: Elder Negus, MD;  Location: [facility] INVASIVE CV LAB;  Service: Cardiovascular;  Laterality: N/A;   LEG SURGERY     "metal plate in leg"   RIGHT HEART CATH AND CORONARY/GRAFT ANGIOGRAPHY N/A 07/21/2018   Procedure: RIGHT HEART CATH AND CORONARY/GRAFT ANGIOGRAPHY;  Surgeon: Elder Negus, MD;  Location: [facility] INVASIVE CV LAB;  Service: Cardiovascular;  Laterality: N/A;   TEE WITHOUT CARDIOVERSION N/A 07/27/2015   Procedure: TRANSESOPHAGEAL ECHOCARDIOGRAM (TEE);  Surgeon: Thurmon Fair, MD;  Location: [facility] ENDOSCOPY;  Service: Cardiovascular;  Laterality: N/A;   TEE WITHOUT CARDIOVERSION N/A 07/21/2018   Procedure: TRANSESOPHAGEAL ECHOCARDIOGRAM (TEE);  Surgeon: Elder Negus, MD;  Location: [facility] ENDOSCOPY;  Service: Cardiovascular;  Laterality: N/A;   TONSILLECTOMY AND ADENOIDECTOMY      Family History  Problem Relation Age of Onset   Cancer Mother    Hypertension Mother    Diabetes Mother    Hyperlipidemia Mother    Heart disease Mother    Cancer Father    Hypertension Father    Diabetes Father    Heart  disease Father    Hypertension Sister    Hypertension Brother    Hypertension Brother    Schizophrenia Maternal Grandmother     Social History   Occupational History   Not on file  Tobacco Use   Smoking status: Former    Packs/day: 1.50    Years: 40.00    Additional pack years: 0.00    Total pack years: 60.00    Types: Cigarettes    Quit date: 12/23/2016    Years since quitting: 5.4   Smokeless tobacco: Never  Vaping Use   Vaping Use: Never used  Substance and Sexual Activity   Alcohol use: Not Currently   Drug use: No   Sexual activity: Not Currently      Physical Exam: Blood pressure 134/64, pulse 68, temperature 98.2 F (36.8 C), temperature source Oral, height 5\' 5"  (1.651 m), weight 206 lb 3.2 oz (93.5 kg), last menstrual period 06/20/2015, SpO2 97 %.  Gen:      No acute distress, able to complete full sentences.  ENT: no polyps, mild nasal debris and erythema, cobblestoning in oropharynx, mallampati I Lungs:  frequent coughing, expiratory wheezing, harsh, auscultated over the neck and transmitted to lungs.  CV:       irregularly irregular, no pedal edema   Data Reviewed: Imaging: I have personally reviewed the chest xray March 2024 shows cardiomegaly, mild CHF  PFTs:     Latest Ref Rng & Units 07/07/2019    8:58 AM  PFT Results  FVC-Pre L 2.60   FVC-Predicted Pre % 74   FVC-Post L 2.83   FVC-Predicted Post % 80   Pre FEV1/FVC % % 77   Post FEV1/FCV % % 79   FEV1-Pre L 2.00   FEV1-Predicted Pre % 73   FEV1-Post L 2.22   DLCO uncorrected ml/min/mmHg 17.22   DLCO UNC% % 81   DLCO corrected ml/min/mmHg 17.22   DLCO COR %Predicted % 81   DLVA Predicted % 89   TLC L 5.58   TLC % Predicted % 107   RV % Predicted % 138    I have personally reviewed the patient's PFTs and no airflow limitation but with bd response to BD  Feb 2024 Echo shows depressed LVEF 53% - normally functioning mechanical valves  Labs:  Immunization status: Immunization History  Administered Date(s) Administered   Fluad Quad(high Dose 65+) 02/01/2021   Influenza,inj,Quad PF,6+ Mos 12/13/2021   Influenza-Unspecified 01/14/2019   Zoster Recombinat (Shingrix) 05/19/2021, 09/18/2021    External Records Personally Reviewed: pulmonary, pcp, cardiology  Assessment:  Dyspnea on exertion S/p MVR Atrial fibrillation  Chronic rhinitis, GERD, UACS Severe OSA, untreated COPD, Gold Stage 0 History of tobacco use Need for lung cancer screening - next in sept 2024  Plan/Recommendations: Shortness of breath most likely from sleep apnea and  atrial fibrillation.   We will take over CPAP care. I am ordering CPAP therapy. The DME company will call this to deliver.   We need to follow up within 30-90 days of getting a CPAP machine and using it.   Start taking astelin nasal spray. Also start taking singulair. Keep taking acid reflux medicine. Keep taking cetirizine.  You can keep using nebulizer treatments if helpful.   I do not think these symptoms are related to COPD given lack of improvement with maintenance inhalers and prednisone.   Return to Care: Return in about 3 months (around 08/26/2022).   Durel Salts, MD Pulmonary and Critical Care Medicine [facility] Office:[phone]

## 2022-05-28 ENCOUNTER — Encounter: Payer: Self-pay | Admitting: Internal Medicine

## 2022-05-28 ENCOUNTER — Encounter: Payer: Self-pay | Admitting: Cardiology

## 2022-05-28 DIAGNOSIS — I480 Paroxysmal atrial fibrillation: Secondary | ICD-10-CM | POA: Diagnosis not present

## 2022-05-28 DIAGNOSIS — Z952 Presence of prosthetic heart valve: Secondary | ICD-10-CM | POA: Diagnosis not present

## 2022-05-28 DIAGNOSIS — Z7901 Long term (current) use of anticoagulants: Secondary | ICD-10-CM | POA: Diagnosis not present

## 2022-05-28 NOTE — Telephone Encounter (Signed)
Received the following message from patient thanking Dr. Celine Mans for yesterday. Will send to Dr. Celine Mans as a Lorain Childes.   "Please tell Kimberly Hobbs that I said Thank you so much for the care I got yesterday and the medication she gave me has helped soooooo much and I'm very grateful! Love Kimberly Hobbs "

## 2022-05-28 NOTE — Telephone Encounter (Signed)
From patient.

## 2022-05-28 NOTE — Telephone Encounter (Signed)
Continue same dose. We need to have aa better way to track home INR numbers.  Thanks MJP

## 2022-05-30 ENCOUNTER — Encounter: Payer: Self-pay | Admitting: Cardiology

## 2022-05-30 ENCOUNTER — Ambulatory Visit: Payer: 59 | Admitting: Cardiology

## 2022-05-30 VITALS — BP 125/57 | HR 80 | Resp 16 | Ht 65.0 in | Wt 202.0 lb

## 2022-05-30 DIAGNOSIS — I480 Paroxysmal atrial fibrillation: Secondary | ICD-10-CM

## 2022-05-30 DIAGNOSIS — Z7901 Long term (current) use of anticoagulants: Secondary | ICD-10-CM | POA: Diagnosis not present

## 2022-05-30 DIAGNOSIS — Z952 Presence of prosthetic heart valve: Secondary | ICD-10-CM | POA: Diagnosis not present

## 2022-05-30 DIAGNOSIS — Z5181 Encounter for therapeutic drug level monitoring: Secondary | ICD-10-CM

## 2022-05-30 LAB — POCT INR: INR: 3.7 — AB (ref 2.0–3.0)

## 2022-05-30 NOTE — Progress Notes (Signed)
Subjective:   Kimberly Hobbs, female    DOB: 12/27/1960, 62 y.o.   MRN: [id number]   Chief complaint:  Irregular heart beat  62 year-old Caucasian female with CAD and rheumatic mitral and aortic valve stenosis, s/p CABG (LIMA-LAD, SVG-pPDA), mechnical mitral and aortic valve replacement at [facility] (2018), moderate WHO grp II pulmonary hypertension, paroxysmal Afib, OSA, former smoker.  Other than recent URI, patient is doing well.  She was diagnosed to have OSA, and is now recommended CPAP.  Currently, she is scheduled to undergo A-fib ablation in July.    Current Outpatient Medications:    Accu-Chek Softclix Lancets lancets, Use as instructed, Disp: 100 each, Rfl: 12   aspirin EC 81 MG tablet, Take 81 mg by mouth daily. , Disp: , Rfl:    azelastine (ASTELIN) 0.1 % nasal spray, Place 1 spray into both nostrils 2 (two) times daily. Use in each nostril as directed, Disp: 30 mL, Rfl: 12   Blood Glucose Monitoring Suppl (ACCU-CHEK GUIDE) w/Device KIT, Check glucose every morning, Disp: 1 kit, Rfl: 0   citalopram (CELEXA) 40 MG tablet, Take 0.5 tablets (20 mg total) by mouth 2 (two) times daily., Disp: 90 tablet, Rfl: 3   clonazePAM (KLONOPIN) 0.5 MG tablet, TAKE 1 AND 1/2 TABLETS(0.75 MG) BY MOUTH AT BEDTIME (Patient taking differently: Take 0.75 mg by mouth at bedtime.), Disp: 45 tablet, Rfl: 2   cyclobenzaprine (FLEXERIL) 10 MG tablet, Take 1 tablet (10 mg total) by mouth at bedtime., Disp: 30 tablet, Rfl: 0   diltiazem (CARDIZEM CD) 180 MG 24 hr capsule, TAKE 1 CAPSULE BY MOUTH DAILY, Disp: 90 capsule, Rfl: 2   empagliflozin (JARDIANCE) 10 MG TABS tablet, Take 1 tablet (10 mg total) by mouth daily before breakfast., Disp: 30 tablet, Rfl: 2   furosemide (LASIX) 20 MG tablet, Take 2 tablets (40 mg total) by mouth daily., Disp: 1 tablet, Rfl: 0   glucose blood (ACCU-CHEK GUIDE) test strip, Use as instructed, Disp: 100 each, Rfl: 12   HYDROcodone-acetaminophen (NORCO/VICODIN) 5-325 MG  tablet, Take 1 tablet by mouth every 6 (six) hours as needed for moderate pain., Disp: 15 tablet, Rfl: 0   isosorbide mononitrate (IMDUR) 60 MG 24 hr tablet, TAKE 1 TABLET(60 MG) BY MOUTH DAILY (Patient taking differently: Take 60 mg by mouth in the morning.), Disp: 90 tablet, Rfl: 3   levalbuterol (XOPENEX HFA) 45 MCG/ACT inhaler, Inhale 1-2 puffs into the lungs every 6 (six) hours as needed for shortness of breath., Disp: 1 each, Rfl: 12   levalbuterol (XOPENEX) 0.63 MG/3ML nebulizer solution, Take 3 mLs (0.63 mg total) by nebulization every 4 (four) hours as needed for wheezing or shortness of breath., Disp: 3 mL, Rfl: 12   losartan (COZAAR) 50 MG tablet, TAKE 1 TABLET(50 MG) BY MOUTH DAILY, Disp: 90 tablet, Rfl: 3   metFORMIN (GLUCOPHAGE) 500 MG tablet, TAKE 1 TABLET(500 MG) BY MOUTH TWICE DAILY WITH A MEAL, Disp: 180 tablet, Rfl: 3   methocarbamol (ROBAXIN) 500 MG tablet, Take 1 tablet (500 mg total) by mouth every 8 (eight) hours as needed for muscle spasms. (Patient taking differently: Take 500 mg by mouth daily as needed for muscle spasms.), Disp: 40 tablet, Rfl: 0   metoprolol succinate (TOPROL-XL) 25 MG 24 hr tablet, Take 1 tablet (25 mg total) by mouth daily. Take with or immediately following a meal., Disp: 90 tablet, Rfl: 3   montelukast (SINGULAIR) 10 MG tablet, Take 1 tablet (10 mg total) by mouth daily.,  Disp: 30 tablet, Rfl: 11   nitroGLYCERIN (NITROSTAT) 0.4 MG SL tablet, DISSOLVE 1 TABLET UNDER THE TONGUE EVERY 5 MINUTES FOR 3 DOSES AS NEEDED FOR CHEST PAIN, Disp: 25 tablet, Rfl: 3   pantoprazole (PROTONIX) 40 MG tablet, Take 1 tablet (40 mg total) by mouth 2 (two) times daily before a meal. (Patient taking differently: Take 40 mg by mouth daily.), Disp: 180 tablet, Rfl: 3   potassium chloride SA (KLOR-CON M) 20 MEQ tablet, Take 2 tablets (40 mEq total) by mouth daily., Disp: 30 tablet, Rfl: 3   rosuvastatin (CRESTOR) 40 MG tablet, Take 1 tablet (40 mg total) by mouth daily., Disp: 90  tablet, Rfl: 1   spironolactone (ALDACTONE) 50 MG tablet, TAKE 1 TABLET(50 MG) BY MOUTH DAILY (Patient taking differently: Take 50 mg by mouth daily.), Disp: 30 tablet, Rfl: 6   Tiotropium Bromide-Olodaterol (STIOLTO RESPIMAT) 2.5-2.5 MCG/ACT AERS, Inhale 2 puffs into the lungs daily., Disp: 4 g, Rfl: 0   warfarin (COUMADIN) 5 MG tablet, TAKE 1 TABLET BY MOUTH DAILY (Patient taking differently: Take 5 mg by mouth 3 (three) times a week. Wed, Thurs, and Sat), Disp: 90 tablet, Rfl: 2   warfarin (COUMADIN) 7.5 MG tablet, TAKE 1 TABLET BY MOUTH EVERY EVENING (Patient taking differently: Take 7.5 mg by mouth 4 (four) times a week. Sun, Mon, Tues, and Fri), Disp: 90 tablet, Rfl: 2  Cardiovascular studies:  EKG 05/30/2022: Probable sinus rhythm 76 bpm LAFB Possible old anteroseptal infarct -age undetermined  Cardioversion 04/02/2022, 04/27/2022  Echocardiogram 04/05/2022:  Mildly depressed LV systolic function with EF 53%. Left ventricle cavity  is normal in size. Normal global wall motion. Calculated EF 53%.  Mechanical trileaflet aortic valve.  No aortic valve regurgitation. AVA  (VTI) measures 1.4 cm^2. AV Mean Grad measures 20.6 mmHg. AV Pk Vel  measures 3.39 m/s.  Mechanical mitral valve.  No mitral valve regurgitation. E-wave dominant  mitral inflow.  Structurally normal tricuspid valve.  Mild tricuspid regurgitation. RVSP  measures 35 mmHg.  IVC is dilated with respiratory variation.  Normally functioning prosthetic valves.  Personally reviewed and compared with previous echocardiogram 06/01/2020. No significant change noted.   Cardioversion 04/02/2022  Coronary and bypass graft angiography 07/21/2018 and 08/18/2018: LM: Normal LAD: 100% mid occlusion. Mild distal disease LIMA-LAD: Patent LCx: Normal RCA: Prox 60% stenosis, mid 30-40% stenoses. TIMI III flow in prox-mid RCA. 100% distal occlusion SVG-RCA: Patent. Ostial 40%. Distal RCA moderate diffuse disease.    Guide catheter  angiography provided superior images. There is TIMI III flow in SVG which fills RCA all the way to the ostium. Even if she were to have FFR positive lesion in the graft, I do not think the benefits of a stent would outweigh the risk of losing the graft and the entire RCA territory circulation in a borderline lesion. Also, the prox RCA lesion is well bypassed by the SVG graft. Thus, I decided not to perform FFR/PCI to either of these lesions. Continue medical management.   RHC 07/21/2018: #1: RA: 18 mmHg RV 90/12 mmHg PA: 94/40 mmHg. Mean PA 63 mmHg. PW 33 mmHg  Lasix 80 mg  #2: RA 19 mmHg RV 60/0 mmHg PA: 56/16 mmHg. Mean PA 36 mmHg PW: 25 mmHg  CO: 5.4 L/min. CI 2.6 L/min/m2 PVR 2 WU  Impression: Elevated filling pressures Moderate WHO Grp II pulmonary hypertension Improvement in filling pressures with diuresis.  Recent labs: 04/27/2022: Glucose 99, BUN/Cr 38/1.40. EGFR 43. Na/K 138/3.4. Rest of the CMP normal H/H  10/31   Review of Systems  Cardiovascular:  Negative for chest pain, dyspnea on exertion, leg swelling, palpitations and syncope.  Musculoskeletal:        Right forearm/elbow pain  Neurological:  Positive for dizziness.         Vitals:   05/30/22 1525  BP: (!) 125/57  Pulse: 80  Resp: 16  SpO2: 97%      Objective:     Physical Exam Vitals and nursing note reviewed.  Constitutional:      General: She is not in acute distress. Neck:     Vascular: No JVD.  Cardiovascular:     Rate and Rhythm: Normal rate and regular rhythm.     Heart sounds: Normal heart sounds. No murmur heard.    Comments: Metallic S1, S2 Pulmonary:     Effort: Tachypnea and accessory muscle usage present.     Breath sounds: Normal breath sounds. No wheezing or rales.  Musculoskeletal:     Right lower leg: No edema.     Left lower leg: No edema.          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  Chronic HFpEF: Euvolemic. Continue Jardiance 10 mg daily.   PAF: Cardioversion 04/02/2022, 04/27/2022. In sinus rhythm today. Continue metoprolol, diltiazem for now.  Holding amiodarne in case Tikosyn is to be started. She has appt to see Dr. Elberta Fortis on 05/20/2022. High CHA2DS2VAsc score. Continue warfarin.  See below re: anticoagulation  CAD of native and bypass grafts without angina: Patent grafts with non-critical disease.(07/2018). Continue Aspirin, statin.  S/p MVR, AVR: Continue warfarin Anticoagulation Summary  As of 05/30/2022    INR goal:  2.5-3.5  TTR:  65.0 % (4 y)  INR used for dosing:  3.0 (04/26/2022)  Warfarin maintenance plan:  7.5 mg (7.5 mg x 1) every Mon, Fri; 5 mg (5 mg x 1) all other days  Weekly warfarin total:  40 mg  Plan last modified:  Mares, Lesley (05/30/2022)  Next INR check:  06/13/2022  Target end date:  Indefinite   Indications   Status post mechanical aortic valve replacement [Z95.2] Monitoring for long-term anticoagulant use [Z51.81 Z79.01]          Anticoagulation Episode Summary     INR check location:     Preferred lab:     Send INR reminders to:     Comments:          Hypertension: Controlled.  F/u in June 2024    Elder Negus, MD Pager: [phone] Office: [phone]

## 2022-05-31 DIAGNOSIS — G4733 Obstructive sleep apnea (adult) (pediatric): Secondary | ICD-10-CM | POA: Diagnosis not present

## 2022-06-10 ENCOUNTER — Encounter: Payer: 59 | Attending: Physical Medicine & Rehabilitation | Admitting: Physical Medicine & Rehabilitation

## 2022-06-10 ENCOUNTER — Encounter: Payer: Self-pay | Admitting: Physical Medicine & Rehabilitation

## 2022-06-10 VITALS — BP 106/67 | HR 68 | Ht 65.0 in | Wt 205.0 lb

## 2022-06-10 DIAGNOSIS — Z5181 Encounter for therapeutic drug level monitoring: Secondary | ICD-10-CM | POA: Insufficient documentation

## 2022-06-10 DIAGNOSIS — G894 Chronic pain syndrome: Secondary | ICD-10-CM

## 2022-06-10 DIAGNOSIS — Z79891 Long term (current) use of opiate analgesic: Secondary | ICD-10-CM | POA: Diagnosis not present

## 2022-06-10 DIAGNOSIS — G8929 Other chronic pain: Secondary | ICD-10-CM | POA: Diagnosis not present

## 2022-06-10 DIAGNOSIS — M545 Low back pain, unspecified: Secondary | ICD-10-CM | POA: Diagnosis not present

## 2022-06-10 NOTE — Progress Notes (Addendum)
Subjective:    Patient ID: Kimberly Hobbs, female    DOB: 1960/06/16, 62 y.o.   MRN: [id number]  HPI HPI  Kimberly Hobbs is a 62 y.o. year old female  who  has a past medical history of CKD stage 3 due to type 2 diabetes mellitus (05/15/2021), COVID-19, Hyperlipidemia, Hypertension, Pain in both lower extremities (07/23/2019), and Rheumatic heart disease.   They are presenting to PM&R clinic as a new patient for pain management evaluation. They were referred by [name] for treatment of low back pain.  She cant stand for long. Has to use a walker.  Pain is severe if she walks without this. Pain started about 2 years ago but is worsening. 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  Pain is mostly on the right side of her lower back. Sometimes both sides. It doesn't shoot down her legs.  Lying down helps the pain.  She reports her activities are very limited by her pain.  She has been taking hydrocodone 5/325 1 pain is very severe. History of prosthetic valves, schedule for ablation for A-fib    Red flag symptoms: No red flags for back pain endorsed in Hx or ROS  Medications tried: Topical medications - doesn't recall name, didn't help Nsaids- cannot take for cardiac reasons Tylenol- doesn't help Opiates  Hydrocodone -she uses sparingly when pain is very severe, this does help control her pain Gabapentin / Lyrica  Has not tried  Tramadol-does not remember TCAs  - Denies  She is currently on Celexa for her anxiety, this medication visit has been working very well for her Robaxin helps pain  Other treatments: PT/OT  has not tried, in afib waiting on heart ablation  TENs unit - has not tried  Injections-denies Surgery - denies    Goals for pain control: Decreased pain with activity so she can be more active  Prior UDS results: No results found for: "LABOPIA", "COCAINSCRNUR", "LABBENZ", "AMPHETMU", "THCU", "LABBARB"    Pain Inventory Average Pain 7 Pain Right Now 7 My pain is sharp and aching  In the last 24 hours, has pain interfered with the following? General activity 7 Relation with others 0 Enjoyment of life 7 What TIME of day is your pain at its worst? evening Sleep (in general) Fair  Pain is worse with: walking and standing Pain improves with: medication Relief from Meds: 3  use a walker how many minutes can you walk? Maybe 10 minutes ability to climb steps?  yes do you drive?  yes  disabled: date disabled .  spasms depression  Any changes since last visit?  no  Any changes since last visit?  no    Family History  Problem Relation Age of Onset   Cancer Mother    Hypertension Mother    Diabetes Mother    Hyperlipidemia Mother    Heart disease Mother    Cancer Father    Hypertension Father    Diabetes Father    Heart disease Father    Hypertension Sister    Hypertension Brother    Hypertension Brother    Schizophrenia Maternal Grandmother    Social History   Socioeconomic History   Marital status: Divorced    Spouse name: Not on file   Number of children: 4   Years of education: Not on file   Highest education level: GED or equivalent  Occupational History   Not on file  Tobacco Use   Smoking status: Former    Packs/day: 1.50    Years: 40.00    Additional pack years: 0.00    Total pack years: 60.00    Types: Cigarettes    Quit date: 12/23/2016    Years since quitting: 5.4   Smokeless tobacco: Never  Vaping Use   Vaping Use: Never used  Substance and Sexual Activity   Alcohol use: Not Currently   Drug use: No   Sexual activity: Not Currently  Other Topics Concern   Not on file  Social History Narrative   ** Merged History Encounter **       Epworth Sleepiness Scale = 4 (as of 06/28/2015)   Social Determinants of Health   Financial Resource Strain: Low Risk  (10/10/2021)   Overall Financial Resource Strain (CARDIA)    Difficulty of Paying  Living Expenses: Not hard at all  Food Insecurity: No Food Insecurity (10/10/2021)   Hunger Vital Sign    Worried About Running Out of Food in the Last Year: Never true    Ran Out of Food in the Last Year: Never true  Transportation Needs: No Transportation Needs (04/29/2022)   PRAPARE - Administrator, Civil Service (Medical): No    Lack of Transportation (Non-Medical): No  Physical Activity: Inactive (10/10/2021)   Exercise Vital Sign    Days of Exercise per Week: 0 days    Minutes of Exercise per Session: 0 min  Stress: No Stress Concern Present (10/10/2021)   [employer] of Occupational Health - Occupational Stress Questionnaire    Feeling of Stress : Not at all  Social Connections: Moderately Integrated (10/10/2021)   Social Connection and Isolation Panel [NHANES]    Frequency of Communication with Friends and Family: Three times a week    Frequency of Social Gatherings with Friends and Family: Three times a week    Attends Religious Services: Never    Active Member of Clubs or Organizations: Yes    Attends Banker Meetings: 1 to 4 times per year    Marital Status: Living with partner   Past Surgical History:  Procedure Laterality Date   CARDIAC CATHETERIZATION     CARDIOVERSION N/A 04/02/2022   Procedure: CARDIOVERSION;  Surgeon: Elder Negus, MD;  Location: [facility] ENDOSCOPY;  Service: Cardiovascular;  Laterality: N/A;   CARDIOVERSION N/A 04/27/2022   Procedure: CARDIOVERSION;  Surgeon: Elder Negus, MD;  Location: [facility] ENDOSCOPY;  Service: Cardiovascular;  Laterality: N/A;   CHOLECYSTECTOMY     CORONARY/GRAFT ANGIOGRAPHY N/A 08/18/2018   Procedure: CORONARY/GRAFT ANGIOGRAPHY;  Surgeon: Elder Negus, MD;  Location: [facility] INVASIVE CV LAB;  Service: Cardiovascular;  Laterality: N/A;   LEG SURGERY     "metal plate in leg"   RIGHT HEART CATH AND CORONARY/GRAFT ANGIOGRAPHY N/A 07/21/2018   Procedure: RIGHT HEART CATH AND CORONARY/GRAFT  ANGIOGRAPHY;  Surgeon: Elder Negus, MD;  Location: [facility] INVASIVE CV LAB;  Service: Cardiovascular;  Laterality: N/A;   TEE WITHOUT CARDIOVERSION N/A 07/27/2015   Procedure: TRANSESOPHAGEAL ECHOCARDIOGRAM (TEE);  Surgeon: Thurmon Fair, MD;  Location[facility] ENDOSCOPY;  Service: Cardiovascular;  Laterality: N/A;   TEE WITHOUT CARDIOVERSION N/A 07/21/2018   Procedure: TRANSESOPHAGEAL ECHOCARDIOGRAM (TEE);  Surgeon: Elder Negus, MD;  Location: [facility] ENDOSCOPY;  Service: Cardiovascular;  Laterality: N/A;   TONSILLECTOMY AND ADENOIDECTOMY     Past Medical History:  Diagnosis Date   CKD stage 3 due to type 2 diabetes mellitus 05/15/2021   COVID-19  Hyperlipidemia    Hypertension    Pain in both lower extremities 07/23/2019   Rheumatic heart disease    MS/ AS   Ht 5\' 5"  (1.651 m)   Wt 205 lb (93 kg)   LMP 06/20/2015   BMI 34.11 kg/m   Opioid Risk Score:   Fall Risk Score:  `1  Depression screen [facility] 2/9     03/26/2022   11:02 AM 02/26/2022   11:17 AM 02/08/2022   10:26 AM 12/24/2021   10:13 AM 10/10/2021   11:05 AM 10/10/2021   11:00 AM 10/10/2021   10:59 AM  Depression screen PHQ 2/9  Decreased Interest 3 0 0 1 0 1 1  Down, Depressed, Hopeless 3 0 0 1 0 0 0  PHQ - 2 Score 6 0 0 2 0 1 1  Altered sleeping    1     Tired, decreased energy    1     Change in appetite    1     Feeling bad or failure about yourself     1     Trouble concentrating    1     Moving slowly or fidgety/restless    0     Suicidal thoughts    0     PHQ-9 Score    7     Difficult doing work/chores   Not difficult at all Not difficult at all         Review of Systems  Musculoskeletal:  Positive for back pain.  All other systems reviewed and are negative.     Objective:   Physical Exam  Gen: no distress, normal appearing HEENT: oral mucosa pink and moist, NCAT Cardio: Reg rate Chest: normal effort, normal rate of breathing Abd: soft, non-distended Ext: no edema Psych: pleasant, normal  affect Skin: intact Neuro: Alert and awake, follows commands, cranial nerves II through XII intact, intact insight and judgment, speech and language intact Strength 5 out of 5 in all 4 extremities Sensation intact light touch in all 4 extremities No ankle clonus, Hoffmann's negative No ataxia or dysmetria Musculoskeletal:  Slightly antalgic gait Tenderness over bilateral paraspinal muscles lumbar spine right greater than left Tenderness noted over the right SI joint Iliac compression negative SLR negative FABER and FADIR negative Facet loading mildly positive  MRI L-spine 1/17/2024IMPRESSION: 1. L4-L5 moderate facet arthropathy, which can be a cause of back pain. Narrowing of the lateral recesses at this level could affect the descending L5 nerve roots. 2. No spinal canal stenosis or neural foraminal narrowing.     Assessment & Plan:   Chronic lower back pain bilateral but worse on the right without radiculopathy -No improvement with Tylenol, unable to take NSAIDs due to cardiac history -She does have some tenderness over right SI joint, provocative test not particular positive monitor for now -Pain is primarily axial -Foods for pain - list provided -ORT low -TENS unit, order zynex device -She has been using hydrocodone sparingly, consider tramadol -Cymbalta was an option however she is doing very well on her Celexa and would like to avoid changing this if possible.  Will hold off on making this change as this medication is working so well for her mood and anxiety -Discussed consideration of facet joint injections, patient is agreeable however would like to wait until her cardiac ablation is complete -Discussed physical therapy, patient is interested says she would like to defer until her cardiac ablation is complete -UDS and pain agreement today, advised to  call when she is out of her current hydrocodone will consider trying tramadol -Continue Robaxin PRn   Chronic  anxiety -Currently on Celexa, could consider SNRI however would need to wean off Celexa first and this appears to be working very well for her currently

## 2022-06-11 ENCOUNTER — Encounter: Payer: Self-pay | Admitting: Nurse Practitioner

## 2022-06-11 DIAGNOSIS — F411 Generalized anxiety disorder: Secondary | ICD-10-CM

## 2022-06-12 LAB — TOXASSURE SELECT,+ANTIDEPR,UR

## 2022-06-13 ENCOUNTER — Telehealth: Payer: Self-pay | Admitting: Cardiology

## 2022-06-13 ENCOUNTER — Ambulatory Visit: Payer: 59 | Admitting: Cardiology

## 2022-06-13 ENCOUNTER — Encounter: Payer: Self-pay | Admitting: Cardiology

## 2022-06-13 ENCOUNTER — Other Ambulatory Visit: Payer: Self-pay | Admitting: Cardiology

## 2022-06-13 DIAGNOSIS — Z952 Presence of prosthetic heart valve: Secondary | ICD-10-CM | POA: Diagnosis not present

## 2022-06-13 DIAGNOSIS — Z7901 Long term (current) use of anticoagulants: Secondary | ICD-10-CM | POA: Diagnosis not present

## 2022-06-13 DIAGNOSIS — Z5181 Encounter for therapeutic drug level monitoring: Secondary | ICD-10-CM | POA: Diagnosis not present

## 2022-06-13 LAB — POCT INR: INR: 3.8 — AB (ref 2.0–3.0)

## 2022-06-13 NOTE — Progress Notes (Signed)
Description   06/13/2022    INR today was 3.8. Patient goal level is (2.5-3.5)  Prior dose was 7.5 mg Mondays and Friday , and 5 mg all the other days.  Patient will now start  on 7.5 mg Monday and 5 mg all the other days.   Pt will recheck INR 2 weeks on 05/09.   Lab Results      Component                Value               Date                      INR                      3.8 (A)             06/13/2022                INR                      3.7 (A)             05/30/2022                INR                      2.8 (H)             04/27/2022             (Patient will need to start checking her INR once a week starting in June for her upcoming Ablation)  Status post mechanical aortic valve replacement  (primary encounter diagnosis) Plan: POCT INR  H/O mitral valve replacement with mechanical valve  Monitoring for long-term anticoagulant use

## 2022-06-13 NOTE — Telephone Encounter (Signed)
From patient.

## 2022-06-13 NOTE — Telephone Encounter (Signed)
What problem are you experiencing? Patient states that the mask is not fitting her face properly, she said that it is hard to explain but she is having issues with her machine.   Who is your medical equipment company? [employer]   Please route to the sleep study assistant.

## 2022-06-14 DIAGNOSIS — J310 Chronic rhinitis: Secondary | ICD-10-CM | POA: Diagnosis not present

## 2022-06-14 DIAGNOSIS — J449 Chronic obstructive pulmonary disease, unspecified: Secondary | ICD-10-CM | POA: Diagnosis not present

## 2022-06-14 NOTE — Telephone Encounter (Signed)
Spoke with patient and encouraged her to call her dme and speak to the RT for help to trouble shoot her cpap problem. Patient was provided with the phone number to her dme and sleep number to call back if needed.

## 2022-06-14 NOTE — Telephone Encounter (Signed)
From patient.

## 2022-06-15 ENCOUNTER — Other Ambulatory Visit: Payer: Self-pay | Admitting: Cardiology

## 2022-06-15 MED ORDER — POTASSIUM CHLORIDE 20 MEQ PO PACK: 20.0000 meq | PACK | Freq: Every day | ORAL | 3 refills | Status: DC

## 2022-06-21 ENCOUNTER — Telehealth: Payer: Self-pay | Admitting: *Deleted

## 2022-06-21 NOTE — Telephone Encounter (Signed)
Urine drug screen for this encounter is consistent for prescribed medication 

## 2022-06-24 ENCOUNTER — Telehealth: Payer: Self-pay | Admitting: Cardiology

## 2022-06-24 DIAGNOSIS — I25708 Atherosclerosis of coronary artery bypass graft(s), unspecified, with other forms of angina pectoris: Secondary | ICD-10-CM

## 2022-06-24 DIAGNOSIS — G4733 Obstructive sleep apnea (adult) (pediatric): Secondary | ICD-10-CM

## 2022-06-24 DIAGNOSIS — I10 Essential (primary) hypertension: Secondary | ICD-10-CM

## 2022-06-24 NOTE — Telephone Encounter (Signed)
Pt called stating she is still having difficulty with her mask for the CPAP machine. Pt mentioned that she has tried different face mask sizes and they are all leaking air. On 4/26 patient contact our office for the same issue, she was provided the phone number to her dme. Pt stated she does not have that phone number. Will forward to Sleep studies for advise.

## 2022-06-24 NOTE — Telephone Encounter (Signed)
Patient wants a call back to discuss her CPAP machine which is not working for her.

## 2022-06-27 ENCOUNTER — Ambulatory Visit: Payer: 59 | Admitting: Cardiology

## 2022-06-27 DIAGNOSIS — Z7901 Long term (current) use of anticoagulants: Secondary | ICD-10-CM | POA: Diagnosis not present

## 2022-06-27 DIAGNOSIS — Z952 Presence of prosthetic heart valve: Secondary | ICD-10-CM

## 2022-06-27 DIAGNOSIS — Z5181 Encounter for therapeutic drug level monitoring: Secondary | ICD-10-CM | POA: Diagnosis not present

## 2022-06-27 LAB — POCT INR: POC INR: 3.2

## 2022-06-27 NOTE — Progress Notes (Signed)
Description   06/27/2022  INR today was 3.2. Patient goal level is (2.5-3.5)  Prior dose was 7.5 mg on Monday and 5 mg all the other days.  patient will continue with this dosage plan.  Pt will recheck INR 4 weeks on 07/25/2022.  Lab Results      Component                Value               Date                      INR                      3.2                 06/27/2022                INR                      3.8 (A)             06/13/2022                INR                      3.7 (A)             05/30/2022            Status post mechanical aortic valve replacement  (primary encounter diagnosis) Plan: POCT INR  Monitoring for long-term anticoagulant use    Will check INR weekly in June 2024 prior to planned ablation, as per EP request.   Elder Negus, MD Pager: [phone] Office: [phone]

## 2022-06-28 NOTE — Telephone Encounter (Signed)
Patient called and complains the pressure is too strong would like it set on 4-8 cm H20 this is tolerable for her.  Rheanne, Maggiore 05/31/2022 - 06/29/2022 Patient ID: [id number] DOB: 03/03/1960 Age: 62 years Gender: Female 30[address]6 [place] [address] [place], [address] Compliance Report Initial compliance period 05/31/2022 - 06/29/2022 Compliance met Yes Compliance percentage 93% Usage 05/31/2022 - 06/29/2022 Usage days 28/30 days (93%) >= 4 hours 28 days (93%) < 4 hours 0 days (0%) Usage hours 160 hours 3 minutes Average usage (total days) 5 hours 20 minutes Average usage (days used) 5 hours 43 minutes Median usage (days used) 5 hours 28 minutes Total used hours (value since last reset - 06/29/2022) 160 hours AirS[employer] 11 AutoSet Serial number [id number] Mode AutoSet Min Pressure 4 cmH2O Max Pressure 15 cmH2O EPR Fulltime EPR level 3 Response Standard Therapy Pressure - cmH2O Median: 7.0 95th percentile: 10.2 Maximum: 11.6 Leaks - L/min Median: 6.0 95th percentile: 18.3 Maximum: 87.5 Events per hour AI: 3.1 HI: 1.0 AHI: 4.1 Apnea Index Central: 0.3 Obstructive: 2.4 Unknown: 0.2 RERA Index 0.2 Cheyne-Stokes respiration (average duration per night) 0 minutes (0%) Usage - hours Printed on 06/28/2022 - [employer] AirView version 4.44.0-5.0 Page 1 of 1

## 2022-07-01 NOTE — Telephone Encounter (Signed)
Order placed to [employer] via comm message. ?

## 2022-07-03 ENCOUNTER — Other Ambulatory Visit: Payer: Self-pay

## 2022-07-03 MED ORDER — CLONAZEPAM 0.5 MG PO TABS: 0.7500 mg | ORAL_TABLET | Freq: Every day | ORAL | 2 refills | Status: DC

## 2022-07-05 ENCOUNTER — Telehealth: Payer: Self-pay

## 2022-07-05 NOTE — Patient Instructions (Signed)
Visit Information  Thank you for taking time to visit with me today. Please don't hesitate to contact me if I can be of assistance to you.   Following are the goals we discussed today:   Goals Addressed             This Visit's Progress    COMPLETED: Care coordination activities-No follow up required       Care Coordination Interventions: Advised patient to Annual Wellness exam. Discussed [facility] services and support. Assessed SDOH. Advised to discuss with primary care physician if services needed in the future           If you are experiencing a Mental Health or Behavioral Health Crisis or need someone to talk to, please call the Suicide and Crisis Lifeline: 988   Patient verbalizes understanding of instructions and care plan provided today and agrees to view in MyChart. Active MyChart status and patient understanding of how to access instructions and care plan via MyChart confirmed with patient.     The patient has been provided with contact information for the care management team and has been advised to call with any health related questions or concerns.   Khadeeja Elden J Marquon Alcala, RN, MSN [facility] Care Management Care Management Coordinator Direct Line [phone]     

## 2022-07-05 NOTE — Patient Outreach (Signed)
  Care Coordination   Initial Visit Note   07/05/2022 Name: Kimberly Hobbs MRN: [id number] DOB: 1960/09/21  Kimberly Hobbs is a 62 y.o. year old female who sees Nche, [name], NP for primary care. I spoke with  Kimberly Hobbs by phone today.  What matters to the patients health and wellness today?  none    Goals Addressed             This Visit's Progress    COMPLETED: Care coordination activities-No follow up required       Care Coordination Interventions: Advised patient to Annual Wellness exam. Discussed [facility] services and support. Assessed SDOH. Advised to discuss with primary care physician if services needed in the future.         SDOH assessments and interventions completed:  Yes  SDOH Interventions Today    Flowsheet Row Most Recent Value  SDOH Interventions   Housing Interventions Intervention Not Indicated  Transportation Interventions Intervention Not Indicated        Care Coordination Interventions:  Yes, provided   Follow up plan: No further intervention required.   Encounter Outcome:  Pt. Visit Completed   Bary Leriche, RN, MSN [facility] Care Management Care Management Coordinator Direct Line [phone]

## 2022-07-06 ENCOUNTER — Other Ambulatory Visit: Payer: Self-pay | Admitting: Cardiology

## 2022-07-06 DIAGNOSIS — I480 Paroxysmal atrial fibrillation: Secondary | ICD-10-CM

## 2022-07-06 DIAGNOSIS — Z952 Presence of prosthetic heart valve: Secondary | ICD-10-CM

## 2022-07-06 DIAGNOSIS — Z5181 Encounter for therapeutic drug level monitoring: Secondary | ICD-10-CM

## 2022-07-08 ENCOUNTER — Encounter: Payer: 59 | Attending: Physical Medicine & Rehabilitation | Admitting: Physical Medicine & Rehabilitation

## 2022-07-08 ENCOUNTER — Encounter: Payer: Self-pay | Admitting: Physical Medicine & Rehabilitation

## 2022-07-08 VITALS — BP 98/66 | HR 66 | Ht 65.0 in | Wt 203.0 lb

## 2022-07-08 DIAGNOSIS — Z79891 Long term (current) use of opiate analgesic: Secondary | ICD-10-CM | POA: Diagnosis not present

## 2022-07-08 DIAGNOSIS — G8929 Other chronic pain: Secondary | ICD-10-CM | POA: Diagnosis not present

## 2022-07-08 DIAGNOSIS — M545 Low back pain, unspecified: Secondary | ICD-10-CM | POA: Diagnosis not present

## 2022-07-08 DIAGNOSIS — I480 Paroxysmal atrial fibrillation: Secondary | ICD-10-CM | POA: Diagnosis not present

## 2022-07-08 DIAGNOSIS — Z952 Presence of prosthetic heart valve: Secondary | ICD-10-CM | POA: Diagnosis not present

## 2022-07-08 DIAGNOSIS — F411 Generalized anxiety disorder: Secondary | ICD-10-CM | POA: Insufficient documentation

## 2022-07-08 DIAGNOSIS — Z7901 Long term (current) use of anticoagulants: Secondary | ICD-10-CM | POA: Diagnosis not present

## 2022-07-08 MED ORDER — HYDROCODONE-ACETAMINOPHEN 7.5-325 MG PO TABS: 1.0000 | ORAL_TABLET | Freq: Two times a day (BID) | ORAL | 0 refills | Status: AC | PRN

## 2022-07-08 MED ORDER — HYDROCODONE-ACETAMINOPHEN 7.5-325 MG PO TABS: 1.0000 | ORAL_TABLET | Freq: Four times a day (QID) | ORAL | 0 refills | Status: DC | PRN

## 2022-07-08 NOTE — Progress Notes (Addendum)
Subjective:    Patient ID: Kimberly Hobbs, female    DOB: Jun 01, 1960, 62 y.o.   MRN: [id number]  HPI HPI  Kimberly Hobbs is a 62 y.o. year old female  who  has a past medical history of CKD stage 3 due to type 2 diabetes mellitus (HCC) (05/15/2021), COVID-19, Hyperlipidemia, Hypertension, Pain in both lower extremities (07/23/2019), and Rheumatic heart disease.   They are presenting to PM&R clinic as a new patient for pain management evaluation. They were referred by [name] for treatment of low back pain.  She cant stand for long. Has to use a walker.  Pain is severe if she walks without this. Pain started about 2 years ago but is worsening. 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  Pain is mostly on the right side of her lower back. Sometimes both sides. It doesn't shoot down her legs.  Lying down helps the pain.  She reports her activities are very limited by her pain.  She has been taking hydrocodone 5/325 1 pain is very severe. History of prosthetic valves, schedule for ablation for A-fib    Red flag symptoms: No red flags for back pain endorsed in Hx or ROS  Medications tried: Topical medications - doesn't recall name, didn't help Nsaids- cannot take for cardiac reasons Tylenol- doesn't help Opiates  Hydrocodone -she uses sparingly when pain is very severe, this does help control her pain Gabapentin / Lyrica  Has not tried  Tramadol-does not remember TCAs  - Denies  She is currently on Celexa for her anxiety, this medication visit has been working very well for her Robaxin helps pain  Other treatments: PT/OT  has not tried, in afib waiting on heart ablation  TENs unit - has not tried  Injections-denies Surgery - denies    Goals for pain control: Decreased pain with activity so she can be more active  Prior UDS results: No results found for: "LABOPIA", "COCAINSCRNUR", "LABBENZ", "AMPHETMU", "THCU",  "LABBARB"   Pain Inventory Average Pain 7 Pain Right Now 7 My pain is sharp and aching  In the last 24 hours, has pain interfered with the following? General activity 7 Relation with others 0 Enjoyment of life 7 What TIME of day is your pain at its worst? evening Sleep (in general) Fair  Pain is worse with: walking and standing Pain improves with: medication Relief from Meds: 3  use a walker how many minutes can you walk? Maybe 10 minutes ability to climb steps?  yes do you drive?  yes  disabled: date disabled .  spasms depression  Any changes since last visit?  no  Any changes since last visit?  no    Family History  Problem Relation Age of Onset   Cancer Mother    Hypertension Mother    Diabetes Mother    Hyperlipidemia Mother    Heart disease Mother    Cancer Father    Hypertension Father    Diabetes Father    Heart disease Father    Hypertension Sister    Hypertension Brother    Hypertension Brother    Schizophrenia Maternal Grandmother    Social History   Socioeconomic History   Marital status: Divorced    Spouse name: Not on file   Number of children: 4   Years of education: Not on file   Highest education level: GED or equivalent  Occupational History   Not on  file  Tobacco Use   Smoking status: Former    Packs/day: 1.50    Years: 40.00    Additional pack years: 0.00    Total pack years: 60.00    Types: Cigarettes    Quit date: 12/23/2016    Years since quitting: 5.6   Smokeless tobacco: Never  Vaping Use   Vaping Use: Never used  Substance and Sexual Activity   Alcohol use: Not Currently   Drug use: No   Sexual activity: Not Currently  Other Topics Concern   Not on file  Social History Narrative   ** Merged History Encounter **       Epworth Sleepiness Scale = 4 (as of 06/28/2015)   Social Determinants of Health   Financial Resource Strain: Low Risk  (10/10/2021)   Overall Financial Resource Strain (CARDIA)    Difficulty of  Paying Living Expenses: Not hard at all  Food Insecurity: No Food Insecurity (10/10/2021)   Hunger Vital Sign    Worried About Running Out of Food in the Last Year: Never true    Ran Out of Food in the Last Year: Never true  Transportation Needs: No Transportation Needs (07/05/2022)   PRAPARE - Administrator, Civil Service (Medical): No    Lack of Transportation (Non-Medical): No  Physical Activity: Inactive (10/10/2021)   Exercise Vital Sign    Days of Exercise per Week: 0 days    Minutes of Exercise per Session: 0 min  Stress: No Stress Concern Present (10/10/2021)   [employer] of Occupational Health - Occupational Stress Questionnaire    Feeling of Stress : Not at all  Social Connections: Moderately Integrated (10/10/2021)   Social Connection and Isolation Panel [NHANES]    Frequency of Communication with Friends and Family: Three times a week    Frequency of Social Gatherings with Friends and Family: Three times a week    Attends Religious Services: Never    Active Member of Clubs or Organizations: Yes    Attends Banker Meetings: 1 to 4 times per year    Marital Status: Living with partner   Past Surgical History:  Procedure Laterality Date   CARDIAC CATHETERIZATION     CARDIOVERSION N/A 04/02/2022   Procedure: CARDIOVERSION;  Surgeon: Elder Negus, MD;  Location: [facility] ENDOSCOPY;  Service: Cardiovascular;  Laterality: N/A;   CARDIOVERSION N/A 04/27/2022   Procedure: CARDIOVERSION;  Surgeon: Elder Negus, MD;  Location: [facility] ENDOSCOPY;  Service: Cardiovascular;  Laterality: N/A;   CHOLECYSTECTOMY     CORONARY/GRAFT ANGIOGRAPHY N/A 08/18/2018   Procedure: CORONARY/GRAFT ANGIOGRAPHY;  Surgeon: Elder Negus, MD;  Location: [facility] INVASIVE CV LAB;  Service: Cardiovascular;  Laterality: N/A;   LEG SURGERY     "metal plate in leg"   RIGHT HEART CATH AND CORONARY/GRAFT ANGIOGRAPHY N/A 07/21/2018   Procedure: RIGHT HEART CATH AND CORONARY/GRAFT  ANGIOGRAPHY;  Surgeon: Elder Negus, MD;  Location: [facility] INVASIVE CV LAB;  Service: Cardiovascular;  Laterality: N/A;   TEE WITHOUT CARDIOVERSION N/A 07/27/2015   Procedure: TRANSESOPHAGEAL ECHOCARDIOGRAM (TEE);  Surgeon: Thurmon Fair, MD;  Location: [facility] ENDOSCOPY;  Service: Cardiovascular;  Laterality: N/A;   TEE WITHOUT CARDIOVERSION N/A 07/21/2018   Procedure: TRANSESOPHAGEAL ECHOCARDIOGRAM (TEE);  Surgeon: Elder Negus, MD;  Location: [facility] ENDOSCOPY;  Service: Cardiovascular;  Laterality: N/A;   TONSILLECTOMY AND ADENOIDECTOMY     Past Medical History:  Diagnosis Date   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  COVID-19    Hyperlipidemia    Hypertension    Pain in both lower extremities 07/23/2019   Rheumatic heart disease    MS/ AS   BP 98/66   Pulse 66   Ht 5\' 5"  (1.651 m)   Wt 203 lb (92.1 kg)   LMP 06/20/2015   SpO2 98%   BMI 33.78 kg/m   Opioid Risk Score:   Fall Risk Score:  `1  Depression screen [facility] 2/9     07/08/2022    9:52 AM 06/10/2022    9:24 AM 03/26/2022   11:02 AM 02/26/2022   11:17 AM 02/08/2022   10:26 AM 12/24/2021   10:13 AM 10/10/2021   11:05 AM  Depression screen PHQ 2/9  Decreased Interest 0 1 3 0 0 1 0  Down, Depressed, Hopeless 0 0 3 0 0 1 0  PHQ - 2 Score 0 1 6 0 0 2 0  Altered sleeping  3    1   Tired, decreased energy  2    1   Change in appetite  3    1   Feeling bad or failure about yourself   0    1   Trouble concentrating  0    1   Moving slowly or fidgety/restless  0    0   Suicidal thoughts  0    0   PHQ-9 Score  9    7   Difficult doing work/chores  Very difficult   Not difficult at all Not difficult at all       Review of Systems  Musculoskeletal:  Positive for back pain and gait problem.  All other systems reviewed and are negative.      Objective:   Physical Exam  Gen: no distress, normal appearing HEENT: oral mucosa pink and moist, NCAT Cardio: Reg rate Chest: normal effort, normal rate  of breathing Abd: soft, non-distended Ext: no edema Psych: pleasant, normal affect Skin: intact Neuro: Alert and awake, follows commands, cranial nerves II through XII intact, intact insight and judgment, speech and language intact Strength 5 out of 5 in all 4 extremities Sensation intact light touch in all 4 extremities No ankle clonus, Hoffmann's negative No ataxia or dysmetria Musculoskeletal:  Slightly antalgic gait Tenderness over bilateral paraspinal muscles lumbar spine right greater than left Tenderness noted over the right SI joint Iliac compression negative SLR negative FABER and FADIR negative Facet loading mildly positive  MRI L-spine 1/17/2024IMPRESSION: 1. L4-L5 moderate facet arthropathy, which can be a cause of back pain. Narrowing of the lateral recesses at this level could affect the descending L5 nerve roots. 2. No spinal canal stenosis or neural foraminal narrowing.     Assessment & Plan:   Chronic lower back pain bilateral but worse on the right without radiculopathy -No improvement with Tylenol, unable to take NSAIDs due to cardiac history -She does have some tenderness over right SI joint, provocative test not particular positive monitor for now -Pain is primarily axial -Foods for pain - list provided -ORT low -TENS unit, order zynex device -She has been using hydrocodone sparingly, consider tramadol -Cymbalta was an option however she is doing very well on her Celexa and would like to avoid changing this if possible.  Will hold off on making this change as this medication is working so well for her mood and anxiety -Discussed consideration of facet joint injections, patient is agreeable however would like to wait until her cardiac ablation is complete -Discussed physical  therapy, patient is interested says she would like to defer until her cardiac ablation is complete -UDS and pain agreement today, advised to call when she is out of her current  hydrocodone will consider trying tramadol -Continue Robaxin PRn   Chronic anxiety -Currently on Celexa, could consider SNRI however would need to wean off Celexa first and this appears to be working very well for her currently

## 2022-07-09 ENCOUNTER — Other Ambulatory Visit: Payer: Self-pay

## 2022-07-09 DIAGNOSIS — Z7901 Long term (current) use of anticoagulants: Secondary | ICD-10-CM

## 2022-07-09 DIAGNOSIS — Z952 Presence of prosthetic heart valve: Secondary | ICD-10-CM

## 2022-07-09 DIAGNOSIS — Z5181 Encounter for therapeutic drug level monitoring: Secondary | ICD-10-CM

## 2022-07-09 MED ORDER — WARFARIN SODIUM 7.5 MG PO TABS: 7.5000 mg | ORAL_TABLET | ORAL | 0 refills | Status: DC

## 2022-07-09 MED ORDER — POTASSIUM CHLORIDE 20 MEQ PO PACK: 20.0000 meq | PACK | Freq: Every day | ORAL | 3 refills | Status: DC

## 2022-07-12 ENCOUNTER — Other Ambulatory Visit: Payer: Self-pay

## 2022-07-12 DIAGNOSIS — Z952 Presence of prosthetic heart valve: Secondary | ICD-10-CM

## 2022-07-12 DIAGNOSIS — Z7901 Long term (current) use of anticoagulants: Secondary | ICD-10-CM

## 2022-07-12 DIAGNOSIS — I480 Paroxysmal atrial fibrillation: Secondary | ICD-10-CM

## 2022-07-12 MED ORDER — POTASSIUM CHLORIDE CRYS ER 20 MEQ PO TBCR: 20.0000 meq | EXTENDED_RELEASE_TABLET | Freq: Two times a day (BID) | ORAL | 3 refills | Status: DC

## 2022-07-12 MED ORDER — WARFARIN SODIUM 5 MG PO TABS: 5.0000 mg | ORAL_TABLET | ORAL | 1 refills | Status: DC

## 2022-07-13 ENCOUNTER — Encounter: Payer: Self-pay | Admitting: Cardiology

## 2022-07-14 DIAGNOSIS — J310 Chronic rhinitis: Secondary | ICD-10-CM | POA: Diagnosis not present

## 2022-07-14 DIAGNOSIS — J449 Chronic obstructive pulmonary disease, unspecified: Secondary | ICD-10-CM | POA: Diagnosis not present

## 2022-07-16 NOTE — Telephone Encounter (Signed)
I will copy Dr. Elberta Fortis here. If rate is controlled (<110 at rest), we may be able to watch for now. If not, may need to consider cardioversion again before possible ablation in the coming few weeks. Please keep [place] posted.  Thanks MJP

## 2022-07-16 NOTE — Telephone Encounter (Signed)
Please see Dr. Elberta Fortis reply above. Please ask her to contact Afib clinic if needed.  Thanks MJP

## 2022-07-16 NOTE — Telephone Encounter (Signed)
From patient.

## 2022-07-20 DIAGNOSIS — G4733 Obstructive sleep apnea (adult) (pediatric): Secondary | ICD-10-CM | POA: Diagnosis not present

## 2022-07-23 MED ORDER — PREDNISONE 50 MG PO TABS: ORAL_TABLET | ORAL | 0 refills | Status: DC

## 2022-07-26 ENCOUNTER — Ambulatory Visit: Payer: 59 | Admitting: Cardiology

## 2022-07-26 DIAGNOSIS — Z7901 Long term (current) use of anticoagulants: Secondary | ICD-10-CM | POA: Diagnosis not present

## 2022-07-26 DIAGNOSIS — Z952 Presence of prosthetic heart valve: Secondary | ICD-10-CM

## 2022-07-26 DIAGNOSIS — Z5181 Encounter for therapeutic drug level monitoring: Secondary | ICD-10-CM | POA: Diagnosis not present

## 2022-07-26 LAB — POCT INR: INR: 2.6 (ref 2.0–3.0)

## 2022-07-26 NOTE — Progress Notes (Signed)
Description   07/26/2022  INR today was 2.6. Patient goal level is (2.5-3.5)  Prior dose was 7.5 mg on Monday and 5 mg all the other days.  Patient will continue with this dosage plan of : 7.5 mg on Monday and 5 mg all the other days.   Pt will recheck INR 4 weeks on 07/25/2022.  Lab Results      Component                Value               Date                      INR                      2.6                 07/26/2022                INR                      3.2                 06/27/2022                INR                      3.8 (A)             06/13/2022                 Status post mechanical aortic valve replacement  (primary encounter diagnosis) Plan: POCT INR  Monitoring for long-term anticoagulant use

## 2022-07-26 NOTE — Progress Notes (Deleted)
Subjective:   Kimberly Hobbs, female    DOB: [birth year], 62 y.o.   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  Other than recent URI, patient is doing well.  She was diagnosed to have OSA, and is now recommended CPAP.  Currently, she is scheduled to undergo A-fib ablation in July.    Current Outpatient Medications:    predniSONE (DELTASONE) 50 MG tablet, Take 50 mg 13 hours prior to CT Scan. Take 50 mg 7 hours prior to CT Scan. Take 50 mg 1 hour prior to CT Scan., Disp: 3 tablet, Rfl: 0   Accu-Chek Softclix Lancets lancets, Use as instructed, Disp: 100 each, Rfl: 12   aspirin EC 81 MG tablet, Take 81 mg by mouth daily. , Disp: , Rfl:    azelastine (ASTELIN) 0.1 % nasal spray, Place 1 spray into both nostrils 2 (two) times daily. Use in each nostril as directed, Disp: 30 mL, Rfl: 12   Blood Glucose Monitoring Suppl (ACCU-CHEK GUIDE) w/Device KIT, Check glucose every morning, Disp: 1 kit, Rfl: 0   citalopram (CELEXA) 40 MG tablet, Take 0.5 tablets (20 mg total) by mouth 2 (two) times daily., Disp: 90 tablet, Rfl: 3   clonazePAM (KLONOPIN) 0.5 MG tablet, Take 1.5 tablets (0.75 mg total) by mouth at bedtime., Disp: 45 tablet, Rfl: 2   cyclobenzaprine (FLEXERIL) 10 MG tablet, Take 1 tablet (10 mg total) by mouth at bedtime., Disp: 30 tablet, Rfl: 0   diltiazem (CARDIZEM CD) 180 MG 24 hr capsule, TAKE 1 CAPSULE BY MOUTH DAILY, Disp: 90 capsule, Rfl: 2   empagliflozin (JARDIANCE) 10 MG TABS tablet, Take 1 tablet (10 mg total) by mouth daily before breakfast., Disp: 30 tablet, Rfl: 2   furosemide (LASIX) 20 MG tablet, Take 2 tablets (40 mg total) by mouth daily., Disp: 1 tablet, Rfl: 0   glucose blood (ACCU-CHEK GUIDE)  test strip, Use as instructed, Disp: 100 each, Rfl: 12   HYDROcodone-acetaminophen (NORCO) 7.5-325 MG tablet, Take 1 tablet by mouth 2 (two) times daily as needed for moderate pain., Disp: 30 tablet, Rfl: 0   isosorbide mononitrate (IMDUR) 60 MG 24 hr tablet, TAKE 1 TABLET(60 MG) BY MOUTH DAILY (Patient taking differently: Take 60 mg by mouth in the morning.), Disp: 90 tablet, Rfl: 3   levalbuterol (XOPENEX HFA) 45 MCG/ACT inhaler, Inhale 1-2 puffs into the lungs every 6 (six) hours as needed for shortness of breath., Disp: 1 each, Rfl: 12   levalbuterol (XOPENEX) 0.63 MG/3ML nebulizer solution, Take 3 mLs (0.63 mg total) by nebulization every 4 (four) hours as needed for wheezing or shortness of breath., Disp: 3 mL, Rfl: 12   losartan (COZAAR) 50 MG tablet, TAKE 1 TABLET(50 MG) BY MOUTH DAILY, Disp: 90 tablet, Rfl: 3   metFORMIN (GLUCOPHAGE) 500 MG tablet, TAKE 1 TABLET(500 MG) BY MOUTH TWICE DAILY WITH A MEAL, Disp: 180 tablet, Rfl: 3   methocarbamol (ROBAXIN) 500 MG tablet, Take 1 tablet (500 mg total) by mouth every 8 (eight) hours as needed for muscle spasms. (Patient taking differently: Take 500 mg by mouth daily as needed for muscle spasms.), Disp: 40 tablet, Rfl: 0   metoprolol succinate (TOPROL-XL) 25 MG 24 hr tablet, Take 1 tablet (25 mg total) by mouth  daily. Take with or immediately following a meal., Disp: 90 tablet, Rfl: 3   montelukast (SINGULAIR) 10 MG tablet, Take 1 tablet (10 mg total) by mouth daily., Disp: 30 tablet, Rfl: 11   nitroGLYCERIN (NITROSTAT) 0.4 MG SL tablet, DISSOLVE 1 TABLET UNDER THE TONGUE EVERY 5 MINUTES FOR 3 DOSES AS NEEDED FOR CHEST PAIN, Disp: 25 tablet, Rfl: 3   pantoprazole (PROTONIX) 40 MG tablet, Take 1 tablet (40 mg total) by mouth 2 (two) times daily before a meal. (Patient taking differently: Take 40 mg by mouth daily.), Disp: 180 tablet, Rfl: 3   potassium chloride SA (KLOR-CON M) 20 MEQ tablet, Take 1 tablet (20 mEq total) by mouth 2 (two) times daily.,  Disp: 180 tablet, Rfl: 3   rosuvastatin (CRESTOR) 40 MG tablet, Take 1 tablet (40 mg total) by mouth daily., Disp: 90 tablet, Rfl: 1   spironolactone (ALDACTONE) 50 MG tablet, TAKE 1 TABLET(50 MG) BY MOUTH DAILY (Patient taking differently: Take 50 mg by mouth daily.), Disp: 30 tablet, Rfl: 6   Tiotropium Bromide-Olodaterol (STIOLTO RESPIMAT) 2.5-2.5 MCG/ACT AERS, Inhale 2 puffs into the lungs daily., Disp: 4 g, Rfl: 0   warfarin (COUMADIN) 5 MG tablet, Take 1 tablet (5 mg total) by mouth 3 (three) times a week. Wed, Thurs, and Sat, Disp: 90 tablet, Rfl: 1   warfarin (COUMADIN) 7.5 MG tablet, Take 1 tablet (7.5 mg total) by mouth 4 (four) times a week. Sun, Mon, Tues, and Fri, Disp: 90 tablet, Rfl: 0  Cardiovascular studies:  EKG 05/30/2022: Probable sinus rhythm 76 bpm LAFB Possible old anteroseptal infarct -age undetermined  Cardioversion 04/02/2022, 04/27/2022  Echocardiogram 04/05/2022:  Mildly depressed LV systolic function with EF 53%. Left ventricle cavity  is normal in size. Normal global wall motion. Calculated EF 53%.  Mechanical trileaflet aortic valve.  No aortic valve regurgitation. AVA  (VTI) measures 1.4 cm^2. AV Mean Grad measures 20.6 mmHg. AV Pk Vel  measures 3.39 m/s.  Mechanical mitral valve.  No mitral valve regurgitation. E-wave dominant  mitral inflow.  Structurally normal tricuspid valve.  Mild tricuspid regurgitation. RVSP  measures 35 mmHg.  IVC is dilated with respiratory variation.  Normally functioning prosthetic valves.  Personally reviewed and compared with previous echocardiogram 06/01/2020. No significant change noted.   Cardioversion 04/02/2022  Coronary and bypass graft angiography 07/21/2018 and 08/18/2018: LM: Normal LAD: 100% mid occlusion. Mild distal disease LIMA-LAD: Patent LCx: Normal RCA: Prox 60% stenosis, mid 30-40% stenoses. TIMI III flow in prox-mid RCA. 100% distal occlusion SVG-RCA: Patent. Ostial 40%. Distal RCA moderate diffuse  disease.    Guide catheter angiography provided superior images. There is TIMI III flow in SVG which fills RCA all the way to the ostium. Even if she were to have FFR positive lesion in the graft, I do not think the benefits of a stent would outweigh the risk of losing the graft and the entire RCA territory circulation in a borderline lesion. Also, the prox RCA lesion is well bypassed by the SVG graft. Thus, I decided not to perform FFR/PCI to either of these lesions. Continue medical management.   RHC 07/21/2018: #1: RA: 18 mmHg RV 90/12 mmHg PA: 94/40 mmHg. Mean PA 63 mmHg. PW 33 mmHg  Lasix 80 mg  #2: RA 19 mmHg RV 60/0 mmHg PA: 56/16 mmHg. Mean PA 36 mmHg PW: 25 mmHg  CO: 5.4 L/min. CI 2.6 L/min/m2 PVR 2 WU  Impression: Elevated filling pressures Moderate WHO Grp II pulmonary hypertension Improvement in filling pressures with  diuresis.  Recent labs: 04/27/2022: Glucose 99, BUN/Cr 38/1.40. EGFR 43. Na/K 138/3.4. Rest of the CMP normal H/H 10/31   Review of Systems  Cardiovascular:  Negative for chest pain, dyspnea on exertion, leg swelling, palpitations and syncope.  Musculoskeletal:        Right forearm/elbow pain  Neurological:  Positive for dizziness.         There were no vitals filed for this visit.     Objective:     Physical Exam Vitals and nursing note reviewed.  Constitutional:      General: She is not in acute distress. Neck:     Vascular: No JVD.  Cardiovascular:     Rate and Rhythm: Normal rate and regular rhythm.     Heart sounds: Normal heart sounds. No murmur heard.    Comments: Metallic S1, S2 Pulmonary:     Effort: Tachypnea and accessory muscle usage present.     Breath sounds: Normal breath sounds. No wheezing or rales.  Musculoskeletal:     Right lower leg: No edema.     Left lower leg: No edema.          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  Chronic HFpEF: Euvolemic. Continue Jardiance 10 mg daily.   PAF: Cardioversion 04/02/2022, 04/27/2022. In sinus rhythm today. Continue metoprolol, diltiazem for now.  Holding amiodarne in case Tikosyn is to be started. She has appt to see Dr. Elberta Fortis on 05/20/2022. High CHA2DS2VAsc score. Continue warfarin.  See below re: anticoagulation  CAD of native and bypass grafts without angina: Patent grafts with non-critical disease.(07/2018). Continue Aspirin, statin.  S/p MVR, AVR: Continue warfarin   Hypertension: Controlled.  F/u in June 2024    Elder Negus, MD Pager: [phone] Office: [phone]

## 2022-07-29 ENCOUNTER — Encounter: Payer: Self-pay | Admitting: Nurse Practitioner

## 2022-07-29 ENCOUNTER — Ambulatory Visit (INDEPENDENT_AMBULATORY_CARE_PROVIDER_SITE_OTHER): Payer: 59 | Admitting: Nurse Practitioner

## 2022-07-29 VITALS — BP 100/56 | HR 63 | Temp 98.3°F | Resp 16 | Ht 65.0 in | Wt 208.0 lb

## 2022-07-29 DIAGNOSIS — F5089 Other specified eating disorder: Secondary | ICD-10-CM | POA: Diagnosis not present

## 2022-07-29 DIAGNOSIS — D509 Iron deficiency anemia, unspecified: Secondary | ICD-10-CM

## 2022-07-29 DIAGNOSIS — E1122 Type 2 diabetes mellitus with diabetic chronic kidney disease: Secondary | ICD-10-CM | POA: Diagnosis not present

## 2022-07-29 DIAGNOSIS — J300 Vasomotor rhinitis: Secondary | ICD-10-CM | POA: Diagnosis not present

## 2022-07-29 DIAGNOSIS — Z7984 Long term (current) use of oral hypoglycemic drugs: Secondary | ICD-10-CM | POA: Diagnosis not present

## 2022-07-29 DIAGNOSIS — F5104 Psychophysiologic insomnia: Secondary | ICD-10-CM

## 2022-07-29 DIAGNOSIS — N183 Chronic kidney disease, stage 3 unspecified: Secondary | ICD-10-CM

## 2022-07-29 DIAGNOSIS — E1169 Type 2 diabetes mellitus with other specified complication: Secondary | ICD-10-CM

## 2022-07-29 DIAGNOSIS — F411 Generalized anxiety disorder: Secondary | ICD-10-CM

## 2022-07-29 LAB — RENAL FUNCTION PANEL
Albumin: 4.6 g/dL (ref 3.5–5.2)
BUN: 21 mg/dL (ref 6–23)
CO2: 22 mEq/L (ref 19–32)
Calcium: 9.6 mg/dL (ref 8.4–10.5)
Chloride: 106 mEq/L (ref 96–112)
Creatinine, Ser: 1.26 mg/dL — ABNORMAL HIGH (ref 0.40–1.20)
GFR: 46.27 mL/min — ABNORMAL LOW (ref >60.00)
Glucose, Bld: 102 mg/dL — ABNORMAL HIGH (ref 70–99)
Phosphorus: 4.1 mg/dL (ref 2.3–4.6)
Potassium: 5 mEq/L (ref 3.5–5.1)
Sodium: 138 mEq/L (ref 135–145)

## 2022-07-29 LAB — CBC WITH DIFFERENTIAL/PLATELET
Basophils Absolute: 0 10*3/uL (ref 0.0–0.1)
Basophils Relative: 0.5 % (ref 0.0–3.0)
Eosinophils Absolute: 0.5 10*3/uL (ref 0.0–0.7)
Eosinophils Relative: 6.3 % — ABNORMAL HIGH (ref 0.0–5.0)
HCT: 31.2 % — ABNORMAL LOW (ref 36.0–46.0)
Hemoglobin: 9.4 g/dL — ABNORMAL LOW (ref 12.0–15.0)
Lymphocytes Relative: 22.7 % (ref 12.0–46.0)
Lymphs Abs: 1.8 10*3/uL (ref 0.7–4.0)
MCHC: 30.2 g/dL (ref 30.0–36.0)
MCV: 69.2 fl — ABNORMAL LOW (ref 78.0–100.0)
Monocytes Absolute: 0.7 10*3/uL (ref 0.1–1.0)
Monocytes Relative: 8.2 % (ref 3.0–12.0)
Neutro Abs: 5 10*3/uL (ref 1.4–7.7)
Neutrophils Relative %: 62.3 % (ref 43.0–77.0)
Platelets: 283 10*3/uL (ref 150.0–400.0)
RBC: 4.52 Mil/uL (ref 3.87–5.11)
RDW: 18.5 % — ABNORMAL HIGH (ref 11.5–15.5)
WBC: 8.1 10*3/uL (ref 4.0–10.5)

## 2022-07-29 LAB — IBC + FERRITIN
Ferritin: 19.8 ng/mL (ref 10.0–291.0)
Iron: 34 ug/dL — ABNORMAL LOW (ref 42–145)
Saturation Ratios: 6.1 % — ABNORMAL LOW (ref 20.0–50.0)
TIBC: 557.2 ug/dL — ABNORMAL HIGH (ref 250.0–450.0)
Transferrin: 398 mg/dL — ABNORMAL HIGH (ref 212.0–360.0)

## 2022-07-29 LAB — MICROALBUMIN / CREATININE URINE RATIO
Creatinine,U: 17.7 mg/dL
Microalb Creat Ratio: 4 mg/g (ref 0.0–30.0)
Microalb, Ur: <0.7 mg/dL (ref 0.0–1.9)

## 2022-07-29 LAB — VITAMIN B12: Vitamin B-12: 394 pg/mL (ref 211–911)

## 2022-07-29 MED ORDER — TRAZODONE HCL 50 MG PO TABS: 50.0000 mg | ORAL_TABLET | Freq: Every evening | ORAL | 5 refills | Status: DC | PRN

## 2022-07-29 NOTE — Progress Notes (Unsigned)
Established Patient Visit  Patient: Kimberly Hobbs   DOB: 1960-11-13   62 y.o. Female  MRN: [id number] Visit Date: 07/31/2022  Subjective:    Chief Complaint  Patient presents with   Nasal Congestion    When she bends over about 5 drops falls from her nose.   She would also like to talk about her sleep medication Clonazepam.  Since she unable to have the dosage increased she would like to try something different     HPI GAD (generalized anxiety disorder) Has been taking celexa 20mg  daily instead of 40mg  daily. Current use of klonopin 0.75mg  at hs States she feel emotional stable and Wants to switch celexa to trazodone to help with sleep. Reports interrupted sleep due to use of CPAP machine  D/c celexa Start trazodone 50mg  Advised to discuss CPAP machine adjustment with pulmonologist. F/up in 98month  CKD stage 3 due to type 2 diabetes mellitus (HCC) Stable, stable electrolytes Creatinine-microalbumin ration: 23 Current use of losartan, furosemide and spironolactone. No Le edema Stable microcytic anemia.  Repeat BMP: stable Maintain med doses  Type 2 diabetes mellitus with hyperglycemia, without long-term current use of insulin (HCC) Repeat hgbA1c and UACr Controlled Dm with normal UACr   Iron deficiency anemia Reports craving and eating ice x 98month. No obvious blood in stool Last colonoscopy 2017: normal Check cbc and iron panel: deficient Normal B12 Check fecal occult   Vasomotor rhinitis Sent atrovent nasal spray prn  Reviewed medical, surgical, and social history today  Medications: Outpatient Medications Prior to Visit  Medication Sig   Accu-Chek Softclix Lancets lancets Use as instructed   aspirin EC 81 MG tablet Take 81 mg by mouth daily.    azelastine (ASTELIN) 0.1 % nasal spray Place 1 spray into both nostrils 2 (two) times daily. Use in each nostril as directed   Blood Glucose Monitoring Suppl (ACCU-CHEK GUIDE) w/Device KIT Check  glucose every morning   clonazePAM (KLONOPIN) 0.5 MG tablet Take 1.5 tablets (0.75 mg total) by mouth at bedtime.   cyclobenzaprine (FLEXERIL) 10 MG tablet Take 1 tablet (10 mg total) by mouth at bedtime.   diltiazem (CARDIZEM CD) 180 MG 24 hr capsule TAKE 1 CAPSULE BY MOUTH DAILY   empagliflozin (JARDIANCE) 10 MG TABS tablet Take 1 tablet (10 mg total) by mouth daily before breakfast.   furosemide (LASIX) 20 MG tablet Take 2 tablets (40 mg total) by mouth daily.   glucose blood (ACCU-CHEK GUIDE) test strip Use as instructed   HYDROcodone-acetaminophen (NORCO) 7.5-325 MG tablet Take 1 tablet by mouth 2 (two) times daily as needed for moderate pain.   isosorbide mononitrate (IMDUR) 60 MG 24 hr tablet TAKE 1 TABLET(60 MG) BY MOUTH DAILY (Patient taking differently: Take 60 mg by mouth in the morning.)   levalbuterol (XOPENEX HFA) 45 MCG/ACT inhaler Inhale 1-2 puffs into the lungs every 6 (six) hours as needed for shortness of breath.   levalbuterol (XOPENEX) 0.63 MG/3ML nebulizer solution Take 3 mLs (0.63 mg total) by nebulization every 4 (four) hours as needed for wheezing or shortness of breath.   losartan (COZAAR) 50 MG tablet TAKE 1 TABLET(50 MG) BY MOUTH DAILY   metFORMIN (GLUCOPHAGE) 500 MG tablet TAKE 1 TABLET(500 MG) BY MOUTH TWICE DAILY WITH A MEAL   methocarbamol (ROBAXIN) 500 MG tablet Take 1 tablet (500 mg total) by mouth every 8 (eight) hours as needed for muscle spasms. (Patient taking differently:  Take 500 mg by mouth daily as needed for muscle spasms.)   metoprolol succinate (TOPROL-XL) 25 MG 24 hr tablet Take 1 tablet (25 mg total) by mouth daily. Take with or immediately following a meal.   montelukast (SINGULAIR) 10 MG tablet Take 1 tablet (10 mg total) by mouth daily.   nitroGLYCERIN (NITROSTAT) 0.4 MG SL tablet DISSOLVE 1 TABLET UNDER THE TONGUE EVERY 5 MINUTES FOR 3 DOSES AS NEEDED FOR CHEST PAIN   pantoprazole (PROTONIX) 40 MG tablet Take 1 tablet (40 mg total) by mouth 2 (two)  times daily before a meal. (Patient taking differently: Take 40 mg by mouth daily.)   potassium chloride SA (KLOR-CON M) 20 MEQ tablet Take 1 tablet (20 mEq total) by mouth 2 (two) times daily.   predniSONE (DELTASONE) 50 MG tablet Take 50 mg 13 hours prior to CT Scan. Take 50 mg 7 hours prior to CT Scan. Take 50 mg 1 hour prior to CT Scan.   rosuvastatin (CRESTOR) 40 MG tablet Take 1 tablet (40 mg total) by mouth daily.   spironolactone (ALDACTONE) 50 MG tablet TAKE 1 TABLET(50 MG) BY MOUTH DAILY (Patient taking differently: Take 50 mg by mouth daily.)   Tiotropium Bromide-Olodaterol (STIOLTO RESPIMAT) 2.5-2.5 MCG/ACT AERS Inhale 2 puffs into the lungs daily.   warfarin (COUMADIN) 5 MG tablet Take 1 tablet (5 mg total) by mouth 3 (three) times a week. Wed, Thurs, and Sat   warfarin (COUMADIN) 7.5 MG tablet Take 1 tablet (7.5 mg total) by mouth 4 (four) times a week. Sun, Mon, Tues, and Fri   [DISCONTINUED] citalopram (CELEXA) 40 MG tablet Take 0.5 tablets (20 mg total) by mouth 2 (two) times daily.   No facility-administered medications prior to visit.   Reviewed past medical and social history.   ROS per HPI above      Objective:  BP (!) 100/56 (BP Location: Left Arm, Patient Position: Sitting, Cuff Size: Large)   Pulse 63   Temp 98.3 F (36.8 C) (Temporal)   Resp 16   Ht 5\' 5"  (1.651 m)   Wt 208 lb (94.3 kg)   LMP 06/20/2015   SpO2 98%   BMI 34.61 kg/m      Physical Exam Cardiovascular:     Rate and Rhythm: Normal rate and regular rhythm.     Pulses: Normal pulses.     Heart sounds: Normal heart sounds.  Pulmonary:     Effort: Pulmonary effort is normal.     Breath sounds: Normal breath sounds.  Neurological:     Mental Status: She is alert and oriented to person, place, and time.  Psychiatric:        Mood and Affect: Mood normal.        Behavior: Behavior normal.        Thought Content: Thought content normal.     Results for orders placed or performed in visit on  07/29/22  B12  Result Value Ref Range   Vitamin B-12 394 211 - 911 pg/mL  IBC + Ferritin  Result Value Ref Range   Iron 34 (L) 42 - 145 ug/dL   Transferrin 409.8 (H) 212.0 - 360.0 mg/dL   Saturation Ratios 6.1 (L) 20.0 - 50.0 %   Ferritin 19.8 10.0 - 291.0 ng/mL   TIBC 557.2 (H) 250.0 - 450.0 mcg/dL  Microalbumin / creatinine urine ratio  Result Value Ref Range   Microalb, Ur <0.7 0.0 - 1.9 mg/dL   Creatinine,U 11.9 mg/dL   Microalb Creat Ratio  4.0 0.0 - 30.0 mg/g  CBC with Differential/Platelet  Result Value Ref Range   WBC 8.1 4.0 - 10.5 K/uL   RBC 4.52 3.87 - 5.11 Mil/uL   Hemoglobin 9.4 (L) 12.0 - 15.0 g/dL   HCT 57.8 (L) 46.9 - 62.9 %   MCV 69.2 Repeated and verified X2. (L) 78.0 - 100.0 fl   MCHC 30.2 30.0 - 36.0 g/dL   RDW 52.8 (H) 41.3 - 24.4 %   Platelets 283.0 150.0 - 400.0 K/uL   Neutrophils Relative % 62.3 43.0 - 77.0 %   Lymphocytes Relative 22.7 12.0 - 46.0 %   Monocytes Relative 8.2 3.0 - 12.0 %   Eosinophils Relative 6.3 (H) 0.0 - 5.0 %   Basophils Relative 0.5 0.0 - 3.0 %   Neutro Abs 5.0 1.4 - 7.7 K/uL   Lymphs Abs 1.8 0.7 - 4.0 K/uL   Monocytes Absolute 0.7 0.1 - 1.0 K/uL   Eosinophils Absolute 0.5 0.0 - 0.7 K/uL   Basophils Absolute 0.0 0.0 - 0.1 K/uL  Renal Function Panel  Result Value Ref Range   Sodium 138 135 - 145 mEq/L   Potassium 5.0 3.5 - 5.1 mEq/L   Chloride 106 96 - 112 mEq/L   CO2 22 19 - 32 mEq/L   Albumin 4.6 3.5 - 5.2 g/dL   BUN 21 6 - 23 mg/dL   Creatinine, Ser 0.10 (H) 0.40 - 1.20 mg/dL   Glucose, Bld 272 (H) 70 - 99 mg/dL   Phosphorus 4.1 2.3 - 4.6 mg/dL   GFR 53.66 (L) >44.03 mL/min   Calcium 9.6 8.4 - 10.5 mg/dL      Assessment & Plan:    Problem List Items Addressed This Visit       Respiratory   Vasomotor rhinitis - Primary    Sent atrovent nasal spray prn      Relevant Medications   ipratropium (ATROVENT) 0.03 % nasal spray     Endocrine   CKD stage 3 due to type 2 diabetes mellitus (HCC)    Stable, stable  electrolytes Creatinine-microalbumin ration: 23 Current use of losartan, furosemide and spironolactone. No Le edema Stable microcytic anemia.  Repeat BMP: stable Maintain med doses      Relevant Orders   Microalbumin / creatinine urine ratio (Completed)   CBC with Differential/Platelet (Completed)   Renal Function Panel (Completed)   Type 2 diabetes mellitus with hyperglycemia, without long-term current use of insulin (HCC)    Repeat hgbA1c and UACr Controlled Dm with normal UACr         Other   GAD (generalized anxiety disorder)    Has been taking celexa 20mg  daily instead of 40mg  daily. Current use of klonopin 0.75mg  at hs States she feel emotional stable and Wants to switch celexa to trazodone to help with sleep. Reports interrupted sleep due to use of CPAP machine  D/c celexa Start trazodone 50mg  Advised to discuss CPAP machine adjustment with pulmonologist. F/up in 8month      Relevant Medications   traZODone (DESYREL) 50 MG tablet   Iron deficiency anemia    Reports craving and eating ice x 8month. No obvious blood in stool Last colonoscopy 2017: normal Check cbc and iron panel: deficient Normal B12 Check fecal occult       Relevant Orders   Iron, TIBC and Ferritin Panel   Fecal occult blood, imunochemical   Other Visit Diagnoses     Chronic insomnia       Relevant Medications  traZODone (DESYREL) 50 MG tablet   Pica       Relevant Orders   B12 (Completed)   IBC + Ferritin (Completed)   CBC with Differential/Platelet (Completed)   Long term (current) use of oral hypoglycemic drugs          Return in about 4 weeks (around 08/26/2022) for Insomnia and Anxiety.     [name], NP

## 2022-07-29 NOTE — Patient Instructions (Addendum)
Need Atrovent for nasal drainage Discuss CPAP machine with pulmonology Stop Celexa, start trazodone Continue klonopin at current dose

## 2022-07-29 NOTE — Assessment & Plan Note (Addendum)
Has been taking celexa 20mg  daily instead of 40mg  daily. Current use of klonopin 0.75mg  at hs States she feel emotional stable and Wants to switch celexa to trazodone to help with sleep. Reports interrupted sleep due to use of CPAP machine  D/c celexa Start trazodone 50mg  Advised to discuss CPAP machine adjustment with pulmonologist. F/up in 35month

## 2022-07-31 DIAGNOSIS — J300 Vasomotor rhinitis: Secondary | ICD-10-CM | POA: Insufficient documentation

## 2022-07-31 DIAGNOSIS — D509 Iron deficiency anemia, unspecified: Secondary | ICD-10-CM | POA: Insufficient documentation

## 2022-07-31 MED ORDER — IPRATROPIUM BROMIDE 0.03 % NA SOLN
1.0000 | Freq: Two times a day (BID) | NASAL | 0 refills | Status: DC | PRN
Start: 2022-07-31 — End: 2023-02-03

## 2022-07-31 NOTE — Assessment & Plan Note (Signed)
Stable, stable electrolytes Creatinine-microalbumin ration: 23 Current use of losartan, furosemide and spironolactone. No Le edema Stable microcytic anemia.  Repeat BMP: stable Maintain med doses

## 2022-07-31 NOTE — Assessment & Plan Note (Signed)
Reports craving and eating ice x 61month. No obvious blood in stool Last colonoscopy 2017: normal Check cbc and iron panel: deficient Normal B12 Check fecal occult

## 2022-07-31 NOTE — Assessment & Plan Note (Signed)
Repeat hgbA1c and UACr Controlled Dm with normal UACr

## 2022-07-31 NOTE — Assessment & Plan Note (Signed)
Sent atrovent nasal spray prn

## 2022-08-01 ENCOUNTER — Telehealth: Payer: Self-pay | Admitting: Internal Medicine

## 2022-08-01 DIAGNOSIS — G4733 Obstructive sleep apnea (adult) (pediatric): Secondary | ICD-10-CM

## 2022-08-01 NOTE — Telephone Encounter (Signed)
PT states she can not stand all the head gear and mask. Last night she bout had it and just pulled it off.  Do we have a mask that will just go around your ears?  Pls call to advise. Her # is [phone]

## 2022-08-02 NOTE — Telephone Encounter (Signed)
ATC X1 LVM for patient to call the office back 

## 2022-08-02 NOTE — Telephone Encounter (Signed)
Pt returning missed call. 

## 2022-08-02 NOTE — Telephone Encounter (Signed)
Dr. Celine Mans do you want me to place an order to get her re-fitted for a new mask?

## 2022-08-04 ENCOUNTER — Other Ambulatory Visit: Payer: Self-pay | Admitting: Cardiology

## 2022-08-04 DIAGNOSIS — I5032 Chronic diastolic (congestive) heart failure: Secondary | ICD-10-CM

## 2022-08-05 ENCOUNTER — Other Ambulatory Visit: Payer: Self-pay | Admitting: Cardiology

## 2022-08-05 DIAGNOSIS — I480 Paroxysmal atrial fibrillation: Secondary | ICD-10-CM

## 2022-08-05 NOTE — Telephone Encounter (Signed)
Pt. Calling back to speak with nurse 

## 2022-08-05 NOTE — Telephone Encounter (Signed)
Pt returning missed call. 

## 2022-08-06 NOTE — Telephone Encounter (Signed)
Called and spoke with patient. She is ok with doing a mask refit session at the sleep lab. Order has been placed.   Nothing further needed at time of call.

## 2022-08-08 NOTE — Progress Notes (Deleted)
Subjective:    Patient ID: Kimberly Hobbs, female    DOB: [birth year], 61 y.o.   MRN: [id number]  HPI HPI  Kimberly Hobbs is a 62 y.o. year old female  who  has a past medical history of CKD stage 3 due to type 2 diabetes mellitus (HCC) (05/15/2021), COVID-19, Hyperlipidemia, Hypertension, Pain in both lower extremities (07/23/2019), and Rheumatic heart disease.   They are presenting to PM&R clinic as a new patient for pain management evaluation. They were referred by [name] for treatment of low back pain.  She cant stand for long. Has to use a walker.  Pain is severe if she walks without this. Pain started about 2 years ago but is worsening. 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  Pain is mostly on the right side of her lower back. Sometimes both sides. It doesn't shoot down her legs.  Lying down helps the pain.  She reports her activities are very limited by her pain.  She has been taking hydrocodone 5/325 1 pain is very severe. History of prosthetic valves, schedule for ablation for A-fib    Red flag symptoms: No red flags for back pain endorsed in Hx or ROS  Medications tried: Topical medications - doesn't recall name, didn't help Nsaids- cannot take for cardiac reasons Tylenol- doesn't help Opiates  Hydrocodone -she uses sparingly when pain is very severe, this does help control her pain Gabapentin / Lyrica  Has not tried  Tramadol-does not remember TCAs  - Denies  She is currently on Celexa for her anxiety, this medication visit has been working very well for her Robaxin helps pain  Other treatments: PT/OT  has not tried, in afib waiting on heart ablation  TENs unit - has not tried  Injections-denies Surgery - denies    08/23/22 Interval History  Trazodone started - has not tried  Pain in her legs Injections      Goals for pain control: Decreased pain with activity so she can be more active  Prior  UDS results: No results found for: "LABOPIA", "COCAINSCRNUR", "LABBENZ", "AMPHETMU", "THCU", "LABBARB"   Pain Inventory Average Pain 7 Pain Right Now 7 My pain is sharp and aching  In the last 24 hours, has pain interfered with the following? General activity 7 Relation with others 0 Enjoyment of life 7 What TIME of day is your pain at its worst? evening Sleep (in general) Fair  Pain is worse with: walking and standing Pain improves with: medication Relief from Meds: 3  use a walker how many minutes can you walk? Maybe 10 minutes ability to climb steps?  yes do you drive?  yes  disabled: date disabled .  spasms depression  Any changes since last visit?  no  Any changes since last visit?  no    Family History  Problem Relation Age of Onset   Cancer Mother    Hypertension Mother    Diabetes Mother    Hyperlipidemia Mother    Heart disease Mother    Cancer Father    Hypertension Father    Diabetes Father    Heart disease Father    Hypertension Sister    Hypertension Brother    Hypertension Brother    Schizophrenia Maternal Grandmother    Social History   Socioeconomic History   Marital status: Divorced    Spouse name: Not on file   Number of children: 4  Years of education: Not on file   Highest education level: GED or equivalent  Occupational History   Not on file  Tobacco Use   Smoking status: Former    Packs/day: 1.50    Years: 40.00    Additional pack years: 0.00    Total pack years: 60.00    Types: Cigarettes    Quit date: 12/23/2016    Years since quitting: 5.6   Smokeless tobacco: Never  Vaping Use   Vaping Use: Never used  Substance and Sexual Activity   Alcohol use: Not Currently   Drug use: No   Sexual activity: Not Currently  Other Topics Concern   Not on file  Social History Narrative   ** Merged History Encounter **       Epworth Sleepiness Scale = 4 (as of 06/28/2015)   Social Determinants of Health   Financial Resource  Strain: Low Risk  (10/10/2021)   Overall Financial Resource Strain (CARDIA)    Difficulty of Paying Living Expenses: Not hard at all  Food Insecurity: No Food Insecurity (10/10/2021)   Hunger Vital Sign    Worried About Running Out of Food in the Last Year: Never true    Ran Out of Food in the Last Year: Never true  Transportation Needs: No Transportation Needs (07/05/2022)   PRAPARE - Administrator, Civil Service (Medical): No    Lack of Transportation (Non-Medical): No  Physical Activity: Inactive (10/10/2021)   Exercise Vital Sign    Days of Exercise per Week: 0 days    Minutes of Exercise per Session: 0 min  Stress: No Stress Concern Present (10/10/2021)   [employer] of Occupational Health - Occupational Stress Questionnaire    Feeling of Stress : Not at all  Social Connections: Moderately Integrated (10/10/2021)   Social Connection and Isolation Panel [NHANES]    Frequency of Communication with Friends and Family: Three times a week    Frequency of Social Gatherings with Friends and Family: Three times a week    Attends Religious Services: Never    Active Member of Clubs or Organizations: Yes    Attends Banker Meetings: 1 to 4 times per year    Marital Status: Living with partner   Past Surgical History:  Procedure Laterality Date   CARDIAC CATHETERIZATION     CARDIOVERSION N/A 04/02/2022   Procedure: CARDIOVERSION;  Surgeon: Elder Negus, MD;  Location: [facility] ENDOSCOPY;  Service: Cardiovascular;  Laterality: N/A;   CARDIOVERSION N/A 04/27/2022   Procedure: CARDIOVERSION;  Surgeon: Elder Negus, MD;  Location: [facility] ENDOSCOPY;  Service: Cardiovascular;  Laterality: N/A;   CHOLECYSTECTOMY     CORONARY/GRAFT ANGIOGRAPHY N/A 08/18/2018   Procedure: CORONARY/GRAFT ANGIOGRAPHY;  Surgeon: Elder Negus, MD;  Location: [facility] INVASIVE CV LAB;  Service: Cardiovascular;  Laterality: N/A;   LEG SURGERY     "metal plate in leg"   RIGHT HEART CATH  AND CORONARY/GRAFT ANGIOGRAPHY N/A 07/21/2018   Procedure: RIGHT HEART CATH AND CORONARY/GRAFT ANGIOGRAPHY;  Surgeon: Elder Negus, MD;  Location: [facility] INVASIVE CV LAB;  Service: Cardiovascular;  Laterality: N/A;   TEE WITHOUT CARDIOVERSION N/A 07/27/2015   Procedure: TRANSESOPHAGEAL ECHOCARDIOGRAM (TEE);  Surgeon: Thurmon Fair, MD;  Location[facility] ENDOSCOPY;  Service: Cardiovascular;  Laterality: N/A;   TEE WITHOUT CARDIOVERSION N/A 07/21/2018   Procedure: TRANSESOPHAGEAL ECHOCARDIOGRAM (TEE);  Surgeon: Elder Negus, MD;  Location[facility] ENDOSCOPY;  Service: Cardiovascular;  Laterality: N/A;   TONSILLECTOMY AND ADENOIDECTOMY  Past Medical History:  Diagnosis Date   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   COVID-19    Hyperlipidemia    Hypertension    Pain in both lower extremities 07/23/2019   Rheumatic heart disease    MS/ AS   BP 98/66   Pulse 66   Ht 5\' 5"  (1.651 m)   Wt 203 lb (92.1 kg)   LMP 06/20/2015   SpO2 98%   BMI 33.78 kg/m   Opioid Risk Score:   Fall Risk Score:  `1  Depression screen [facility] 2/9     07/08/2022    9:52 AM 06/10/2022    9:24 AM 03/26/2022   11:02 AM 02/26/2022   11:17 AM 02/08/2022   10:26 AM 12/24/2021   10:13 AM 10/10/2021   11:05 AM  Depression screen PHQ 2/9  Decreased Interest 0 1 3 0 0 1 0  Down, Depressed, Hopeless 0 0 3 0 0 1 0  PHQ - 2 Score 0 1 6 0 0 2 0  Altered sleeping  3    1   Tired, decreased energy  2    1   Change in appetite  3    1   Feeling bad or failure about yourself   0    1   Trouble concentrating  0    1   Moving slowly or fidgety/restless  0    0   Suicidal thoughts  0    0   PHQ-9 Score  9    7   Difficult doing work/chores  Very difficult   Not difficult at all Not difficult at all       Review of Systems  Musculoskeletal:  Positive for back pain and gait problem.  All other systems reviewed and are negative.      Objective:   Physical Exam  Gen: no distress, normal  appearing HEENT: oral mucosa pink and moist, NCAT Cardio: Reg rate Chest: normal effort, normal rate of breathing Abd: soft, non-distended Ext: no edema Psych: pleasant, normal affect Skin: intact Neuro: Alert and awake, follows commands, cranial nerves II through XII intact, intact insight and judgment, speech and language intact Strength 5 out of 5 in all 4 extremities Sensation intact light touch in all 4 extremities No ankle clonus, Hoffmann's negative No ataxia or dysmetria Musculoskeletal:  Slightly antalgic gait Tenderness over bilateral paraspinal muscles lumbar spine right greater than left Tenderness noted over the right SI joint Iliac compression negative SLR negative FABER and FADIR negative Facet loading mildly positive  MRI L-spine 1/17/2024IMPRESSION: 1. L4-L5 moderate facet arthropathy, which can be a cause of back pain. Narrowing of the lateral recesses at this level could affect the descending L5 nerve roots. 2. No spinal canal stenosis or neural foraminal narrowing.     Assessment & Plan:   Chronic lower back pain bilateral but worse on the right without radiculopathy -No improvement with Tylenol, unable to take NSAIDs due to cardiac history -She does have some tenderness over right SI joint, provocative test not particular positive monitor for now -Pain is primarily axial -Foods for pain - list provided -ORT low -TENS unit, order zynex device -She has been using hydrocodone sparingly, consider tramadol -Cymbalta was an option however she is doing very well on her Celexa and would like to avoid changing this if possible.  Will hold off on making this change as this medication is working so well for her mood and anxiety -Discussed  consideration of facet joint injections, patient is agreeable however would like to wait until her cardiac ablation is complete -Discussed physical therapy, patient is interested says she would like to defer until her cardiac  ablation is complete -UDS and pain agreement today, advised to call when she is out of her current hydrocodone will consider trying tramadol -Continue Robaxin PRn   Chronic anxiety -Currently on Celexa, could consider SNRI however would need to wean off Celexa first and this appears to be working very well for her currently

## 2022-08-09 ENCOUNTER — Ambulatory Visit: Payer: 59 | Admitting: Cardiology

## 2022-08-09 DIAGNOSIS — Z952 Presence of prosthetic heart valve: Secondary | ICD-10-CM | POA: Diagnosis not present

## 2022-08-09 DIAGNOSIS — Z5181 Encounter for therapeutic drug level monitoring: Secondary | ICD-10-CM | POA: Diagnosis not present

## 2022-08-09 DIAGNOSIS — Z7901 Long term (current) use of anticoagulants: Secondary | ICD-10-CM | POA: Diagnosis not present

## 2022-08-09 LAB — POCT INR
INR: 2.8 (ref 2.0–3.0)
POC INR: 2.8

## 2022-08-09 NOTE — Progress Notes (Signed)
Anti-Coagulation Progress Note  Ilaisaane Marts is a 62 y.o. female who is currently on an anti-coagulation regimen for  history of mechanical mitral and aortic valve replacement, paroxysmal atrial fibrillation  RECENT RESULTS: Lab Results  Component Value Date   INR 2.8 08/09/2022   INR 2.8 08/09/2022   INR 2.6 07/26/2022    ANTI-COAG DOSE: Description   08/09/2022  INR today was 2.8. Patient goal level is (2.5-3.5)  Current dose was 7.5 mg on Monday and 5 mg all the other days.  New dose: 7.5 mg on Monday and 5 mg all the other days (same as before)  Pt will recheck INR 1 week on 08/15/2022.  Status post mechanical aortic & mitral valve replacement  (primary encounter diagnosis) Plan: POCT INR  Monitoring for long-term anticoagulant use      ASSESSMENT:   ICD-10-CM   1. Status post mechanical aortic valve replacement  Z95.2 POCT INR    2. H/O mitral valve replacement with mechanical valve  Z95.2     3. Monitoring for long-term anticoagulant use  Z51.81 POCT INR   Z79.01      PLAN: Continue current dose of Coumadin  FOLLOW-UP Return in about 1 week (around 08/16/2022) for INR check.  Tessa Lerner, [place], [facility]  Pager:  [phone] Office: [phone]

## 2022-08-10 ENCOUNTER — Encounter: Payer: Self-pay | Admitting: Cardiology

## 2022-08-11 DIAGNOSIS — N390 Urinary tract infection, site not specified: Secondary | ICD-10-CM | POA: Diagnosis not present

## 2022-08-11 DIAGNOSIS — R350 Frequency of micturition: Secondary | ICD-10-CM | POA: Diagnosis not present

## 2022-08-11 DIAGNOSIS — R300 Dysuria: Secondary | ICD-10-CM | POA: Diagnosis not present

## 2022-08-12 ENCOUNTER — Other Ambulatory Visit: Payer: Self-pay

## 2022-08-12 DIAGNOSIS — I5032 Chronic diastolic (congestive) heart failure: Secondary | ICD-10-CM

## 2022-08-12 MED ORDER — EMPAGLIFLOZIN 10 MG PO TABS
ORAL_TABLET | ORAL | 3 refills | Status: AC
Start: 2022-08-12 — End: ?

## 2022-08-14 ENCOUNTER — Encounter: Payer: Self-pay | Admitting: Nurse Practitioner

## 2022-08-14 DIAGNOSIS — J310 Chronic rhinitis: Secondary | ICD-10-CM | POA: Diagnosis not present

## 2022-08-14 DIAGNOSIS — J449 Chronic obstructive pulmonary disease, unspecified: Secondary | ICD-10-CM | POA: Diagnosis not present

## 2022-08-15 ENCOUNTER — Encounter: Payer: Self-pay | Admitting: Cardiology

## 2022-08-19 ENCOUNTER — Ambulatory Visit: Payer: 59 | Admitting: Cardiology

## 2022-08-19 ENCOUNTER — Ambulatory Visit (HOSPITAL_BASED_OUTPATIENT_CLINIC_OR_DEPARTMENT_OTHER): Payer: 59 | Attending: Internal Medicine | Admitting: Radiology

## 2022-08-19 DIAGNOSIS — Z952 Presence of prosthetic heart valve: Secondary | ICD-10-CM | POA: Diagnosis not present

## 2022-08-19 DIAGNOSIS — Z5181 Encounter for therapeutic drug level monitoring: Secondary | ICD-10-CM

## 2022-08-19 DIAGNOSIS — I080 Rheumatic disorders of both mitral and aortic valves: Secondary | ICD-10-CM | POA: Diagnosis not present

## 2022-08-19 DIAGNOSIS — G4733 Obstructive sleep apnea (adult) (pediatric): Secondary | ICD-10-CM

## 2022-08-19 DIAGNOSIS — Z7901 Long term (current) use of anticoagulants: Secondary | ICD-10-CM

## 2022-08-19 LAB — POCT INR: INR: 2.3 (ref 2.0–3.0)

## 2022-08-20 ENCOUNTER — Encounter: Payer: Self-pay | Admitting: Cardiology

## 2022-08-20 ENCOUNTER — Other Ambulatory Visit: Payer: Self-pay

## 2022-08-20 MED ORDER — WARFARIN SODIUM 7.5 MG PO TABS: ORAL_TABLET | ORAL | 3 refills | Status: DC

## 2022-08-20 MED ORDER — WARFARIN SODIUM 5 MG PO TABS: ORAL_TABLET | ORAL | 3 refills | Status: DC

## 2022-08-20 NOTE — Telephone Encounter (Signed)
Patient is calling again regarding her sleep mask.  She stated that moisture is coming thru her mask and her doctor said it needed to have more heat.  Please call patient to discuss further asap.  CB# [phone]

## 2022-08-20 NOTE — Telephone Encounter (Signed)
Called patient but she did not answer. Left message for patient to call back.  

## 2022-08-21 ENCOUNTER — Encounter: Payer: Self-pay | Admitting: Internal Medicine

## 2022-08-21 ENCOUNTER — Telehealth: Payer: Self-pay | Admitting: Internal Medicine

## 2022-08-21 DIAGNOSIS — G4733 Obstructive sleep apnea (adult) (pediatric): Secondary | ICD-10-CM

## 2022-08-21 NOTE — Telephone Encounter (Signed)
Patient checking on message for CPAP machine. Patient phone number is [phone].

## 2022-08-23 ENCOUNTER — Encounter: Payer: Self-pay | Admitting: Physical Medicine & Rehabilitation

## 2022-08-23 ENCOUNTER — Ambulatory Visit: Payer: 59 | Attending: Internal Medicine

## 2022-08-23 ENCOUNTER — Encounter: Payer: 59 | Attending: Physical Medicine & Rehabilitation | Admitting: Physical Medicine & Rehabilitation

## 2022-08-23 VITALS — BP 99/55 | HR 58 | Ht 65.0 in | Wt 209.0 lb

## 2022-08-23 DIAGNOSIS — M545 Low back pain, unspecified: Secondary | ICD-10-CM | POA: Diagnosis not present

## 2022-08-23 DIAGNOSIS — I10 Essential (primary) hypertension: Secondary | ICD-10-CM | POA: Diagnosis not present

## 2022-08-23 DIAGNOSIS — D6869 Other thrombophilia: Secondary | ICD-10-CM | POA: Diagnosis not present

## 2022-08-23 DIAGNOSIS — I2510 Atherosclerotic heart disease of native coronary artery without angina pectoris: Secondary | ICD-10-CM | POA: Diagnosis not present

## 2022-08-23 DIAGNOSIS — G8929 Other chronic pain: Secondary | ICD-10-CM | POA: Insufficient documentation

## 2022-08-23 DIAGNOSIS — I4819 Other persistent atrial fibrillation: Secondary | ICD-10-CM

## 2022-08-23 DIAGNOSIS — F411 Generalized anxiety disorder: Secondary | ICD-10-CM | POA: Insufficient documentation

## 2022-08-23 DIAGNOSIS — Z79891 Long term (current) use of opiate analgesic: Secondary | ICD-10-CM | POA: Insufficient documentation

## 2022-08-23 LAB — BASIC METABOLIC PANEL

## 2022-08-23 LAB — CBC
Hematocrit: 32.7 % — ABNORMAL LOW (ref 34.0–46.6)
MCH: 21 pg — ABNORMAL LOW (ref 26.6–33.0)
MCHC: 29.4 g/dL — ABNORMAL LOW (ref 31.5–35.7)
Platelets: 250 10*3/uL (ref 150–450)
RDW: 17.1 % — ABNORMAL HIGH (ref 11.7–15.4)

## 2022-08-23 MED ORDER — HYDROCODONE-ACETAMINOPHEN 7.5-325 MG PO TABS: 1.0000 | ORAL_TABLET | Freq: Two times a day (BID) | ORAL | 0 refills | Status: DC | PRN

## 2022-08-23 MED ORDER — DICLOFENAC SODIUM 1 % EX GEL: 4.0000 g | Freq: Four times a day (QID) | CUTANEOUS | 5 refills | Status: DC

## 2022-08-23 NOTE — Telephone Encounter (Signed)
Pt is asking for the pressure on the CPAP she already has to be lowered. It is too strong. She is also asking for the humidifier to be adjusted there is a lot of condensation in the tube. Her DME suggested having another study done since she is having significant leg pains at night. She would like a call back

## 2022-08-23 NOTE — Telephone Encounter (Signed)
Spoke with patient. She stated that she had her mask refit session earlier this week. Since she has been having a lot of condensation in her tubing, they suggested that she have heated humidity added to her cpap machine. She would also like to have the pressure setting lowered. Confirmed that her machine is still set at 5-20cm. The pressure is so great at times that the mask is not able to stay on her face.   The sleep tech also suggested that she have an in-lab study since she has been having increased leg pains while sleeping.   Dr. Celine Mans, can you please advise? Thanks!

## 2022-08-23 NOTE — Addendum Note (Signed)
Addended by: [name] on: 08/23/2022 11:22 AM   Modules accepted: Orders

## 2022-08-23 NOTE — Telephone Encounter (Signed)
Called patient but she did not answer. Left message for patient to call back.  

## 2022-08-23 NOTE — Telephone Encounter (Signed)
I called and spoke with the pt and notified of response per Dr. Celine Mans  She verbalized understanding  Orders both placed

## 2022-08-23 NOTE — Telephone Encounter (Signed)
Ok to order cpap titration study and heated humidification.

## 2022-08-23 NOTE — Progress Notes (Signed)
Subjective:    Patient ID: Kimberly Hobbs, female    DOB: [birth year], 62 y.o.   MRN: [id number]  HPI   Kimberly Hobbs is a 62 y.o. year old female  who  has a past medical history of CKD stage 3 due to type 2 diabetes mellitus (HCC) (05/15/2021), COVID-19, Hyperlipidemia, Hypertension, Pain in both lower extremities (07/23/2019), and Rheumatic heart disease.   They are presenting to PM&R clinic as a new patient for pain management evaluation. They were referred by [name] for treatment of low back pain.  She cant stand for long. Has to use a walker.  Pain is severe if she walks without this. Pain started about 2 years ago but is worsening. 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  Pain is mostly on the right side of her lower back. Sometimes both sides. It doesn't shoot down her legs.  Lying down helps the pain.  She reports her activities are very limited by her pain.  She has been taking hydrocodone 5/325 1 pain is very severe. History of prosthetic valves, schedule for ablation for A-fib       Red flag symptoms: No red flags for back pain endorsed in Hx or ROS   Medications tried: Topical medications - doesn't recall name, didn't help Nsaids- cannot take for cardiac reasons Tylenol- doesn't help Opiates  Hydrocodone -she uses sparingly when pain is very severe, this does help control her pain Gabapentin / Lyrica  Has not tried  Tramadol-does not remember TCAs  - Denies  She is currently on Celexa for her anxiety, this medication visit has been working very well for her Robaxin helps pain   Other treatments: PT/OT  has not tried, in afib waiting on heart ablation  TENs unit - has not tried  Injections-denies Surgery - denies     Interal History 08/23/22  Kimberly Hobbs is here for follow-up regarding her chronic pain.  She reports she continues to have right-sided back pain.  She also has some aching pain in her  legs bilaterally.  She reports she has her cardiac ablation procedures 09/05/2022 so after this time she would be interested in getting facet joint injections.  Pain is worsened with standing and she continues to use her walker for ambulation.  Pain continues to be improved with lying down.  Vicodin 7.5 is helping her control this pain.  She tried to use this sparingly only when pain is very severe and 30 tablets has lasted her about a month and a half.  She is not having any side effects with this medication.   Pain Inventory Average Pain 8 Pain Right Now 3 My pain is intermittent, sharp, stabbing, and aching  In the last 24 hours, has pain interfered with the following? General activity 10 Relation with others 0 Enjoyment of life 5 What TIME of day is your pain at its worst? evening Sleep (in general) Fair  Pain is worse with: walking, bending, and standing Pain improves with: rest and medication Relief from Meds: 5  Family History  Problem Relation Age of Onset   Cancer Mother    Hypertension Mother    Diabetes Mother    Hyperlipidemia Mother    Heart disease Mother    Cancer Father    Hypertension Father    Diabetes Father    Heart disease Father    Hypertension Sister    Hypertension Brother  Hypertension Brother    Schizophrenia Maternal Grandmother    Social History   Socioeconomic History   Marital status: Divorced    Spouse name: Not on file   Number of children: 4   Years of education: Not on file   Highest education level: GED or equivalent  Occupational History   Not on file  Tobacco Use   Smoking status: Former    Packs/day: 1.50    Years: 40.00    Additional pack years: 0.00    Total pack years: 60.00    Types: Cigarettes    Quit date: 12/23/2016    Years since quitting: 5.6   Smokeless tobacco: Never  Vaping Use   Vaping Use: Never used  Substance and Sexual Activity   Alcohol use: Not Currently   Drug use: No   Sexual activity: Not Currently   Other Topics Concern   Not on file  Social History Narrative   ** Merged History Encounter **       Epworth Sleepiness Scale = 4 (as of 06/28/2015)   Social Determinants of Health   Financial Resource Strain: Low Risk  (10/10/2021)   Overall Financial Resource Strain (CARDIA)    Difficulty of Paying Living Expenses: Not hard at all  Food Insecurity: No Food Insecurity (10/10/2021)   Hunger Vital Sign    Worried About Running Out of Food in the Last Year: Never true    Ran Out of Food in the Last Year: Never true  Transportation Needs: No Transportation Needs (07/05/2022)   PRAPARE - Administrator, Civil Service (Medical): No    Lack of Transportation (Non-Medical): No  Physical Activity: Inactive (10/10/2021)   Exercise Vital Sign    Days of Exercise per Week: 0 days    Minutes of Exercise per Session: 0 min  Stress: No Stress Concern Present (10/10/2021)   [employer] of Occupational Health - Occupational Stress Questionnaire    Feeling of Stress : Not at all  Social Connections: Moderately Integrated (10/10/2021)   Social Connection and Isolation Panel [NHANES]    Frequency of Communication with Friends and Family: Three times a week    Frequency of Social Gatherings with Friends and Family: Three times a week    Attends Religious Services: Never    Active Member of Clubs or Organizations: Yes    Attends Banker Meetings: 1 to 4 times per year    Marital Status: Living with partner   Past Surgical History:  Procedure Laterality Date   CARDIAC CATHETERIZATION     CARDIOVERSION N/A 04/02/2022   Procedure: CARDIOVERSION;  Surgeon: Elder Negus, MD;  Location: [facility] END[facility];  Service: Cardiovascular;  Laterality: N/A;   CARDIOVERSION N/A 04/27/2022   Procedure: CARDIOVERSION;  Surgeon: Elder Negus, MD;  Location: [facility];  Service: Cardiovascular;  Laterality: N/A;   CHOLECYSTECTOMY     CORONARY/GRAFT ANGIOGRAPHY N/A 08/18/2018    Procedure: CORONARY/GRAFT ANGIOGRAPHY;  Surgeon: Elder Negus, MD;  Location: [facility]AS[facility];  Service: Cardiovascular;  Laterality: N/A;   LEG SURGERY     "metal plate in leg"   RIGHT HEART CATH AND CORONARY/GRAFT ANGIOGRAPHY N/A 07/21/2018   Procedure: RIGHT HEART CATH AND CORONARY/GRAFT ANGIOGRAPHY;  Surgeon: Elder Negus, MD;  Location: [facility];  Service: Cardiovascular;  Laterality: N/A;   TEE WITHOUT CARDIOVERSION N/A 07/27/2015   Procedure: TRANSESOPHAGEAL ECHOCARDIOGRAM (TEE);  Surgeon: Thurmon Fair, MD;  Location: [facility] ENDOSCOPY;  Service: Cardiovascular;  Laterality: N/A;  TEE WITHOUT CARDIOVERSION N/A 07/21/2018   Procedure: TRANSESOPHAGEAL ECHOCARDIOGRAM (TEE);  Surgeon: Elder Negus, MD;  Location: [facility] ENDOSCOPY;  Service: Cardiovascular;  Laterality: N/A;   TONSILLECTOMY AND ADENOIDECTOMY     Past Surgical History:  Procedure Laterality Date   CARDIAC CATHETERIZATION     CARDIOVERSION N/A 04/02/2022   Procedure: CARDIOVERSION;  Surgeon: Elder Negus, MD;  Location: [facility]OS[facility];  Service: Cardiovascular;  Laterality: N/A;   CARDIOVERSION N/A 04/27/2022   Procedure: CARDIOVERSION;  Surgeon: Elder Negus, MD;  Location: [facility]OS[facility];  Service: Cardiovascular;  Laterality: N/A;   CHOLECYSTECTOMY     CORONARY/GRAFT ANGIOGRAPHY N/A 08/18/2018   Procedure: CORONARY/GRAFT ANGIOGRAPHY;  Surgeon: Elder Negus, MD;  Location: [facility];  Service: Cardiovascular;  Laterality: N/A;   LEG SURGERY     "metal plate in leg"   RIGHT HEART CATH AND CORONARY/GRAFT ANGIOGRAPHY N/A 07/21/2018   Procedure: RIGHT HEART CATH AND CORONARY/GRAFT ANGIOGRAPHY;  Surgeon: Elder Negus, MD;  Location: [facility];  Service: Cardiovascular;  Laterality: N/A;   TEE WITHOUT CARDIOVERSION N/A 07/27/2015   Procedure: TRANSESOPHAGEAL ECHOCARDIOGRAM (TEE);  Surgeon: Thurmon Fair, MD;  Location: [facility] ENDOSCOPY;  Service: Cardiovascular;   Laterality: N/A;   TEE WITHOUT CARDIOVERSION N/A 07/21/2018   Procedure: TRANSESOPHAGEAL ECHOCARDIOGRAM (TEE);  Surgeon: Elder Negus, MD;  Location: [facility] ENDOSCOPY;  Service: Cardiovascular;  Laterality: N/A;   TONSILLECTOMY AND ADENOIDECTOMY     Past Medical History:  Diagnosis Date   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   COVID-19    Hyperlipidemia    Hypertension    Pain in both lower extremities 07/23/2019   Rheumatic heart disease    MS/ AS   BP (!) 99/55   Pulse (!) 58   Ht 5\' 5"  (1.651 m)   Wt 209 lb (94.8 kg)   LMP 06/20/2015   SpO2 98%   BMI 34.78 kg/m   Opioid Risk Score:   Fall Risk Score:  `1  Depression screen [facility] 2/9     08/23/2022   10:32 AM 07/08/2022    9:52 AM 06/10/2022    9:24 AM 03/26/2022   11:02 AM 02/26/2022   11:17 AM 02/08/2022   10:26 AM 12/24/2021   10:13 AM  Depression screen PHQ 2/9  Decreased Interest 1 0 1 3 0 0 1  Down, Depressed, Hopeless 1 0 0 3 0 0 1  PHQ - 2 Score 2 0 1 6 0 0 2  Altered sleeping   3    1  Tired, decreased energy   2    1  Change in appetite   3    1  Feeling bad or failure about yourself    0    1  Trouble concentrating   0    1  Moving slowly or fidgety/restless   0    0  Suicidal thoughts   0    0  PHQ-9 Score   9    7  Difficult doing work/chores   Very difficult   Not difficult at all Not difficult at all     Review of Systems  Musculoskeletal:  Positive for back pain.  All other systems reviewed and are negative.     Objective:   Physical Exam   Gen: no distress, normal appearing HEENT: oral mucosa pink and moist, NCAT Cardio: Reg rate Chest: normal effort, normal rate of breathing Abd: soft, non-distended Ext: no edema Psych:  pleasant, normal affect Skin: intact Neuro: Alert and awake, follows commands, cranial nerves II through XII intact, intact insight and judgment, speech and language intact Strength 5 out of 5 in all 4 extremities Sensation intact light touch in  all 4 extremities Musculoskeletal:  Walks with a walker Tenderness over bilateral paraspinal muscles lumbar spine right greater than left Mild tenderness noted over the right SI joint Slump test negative FABER and FADIR negative Facet loading positive Pain with spinal extension   MRI L-spine 1/17/2024IMPRESSION: 1. L4-L5 moderate facet arthropathy, which can be a cause of back pain. Narrowing of the lateral recesses at this level could affect the descending L5 nerve roots. 2. No spinal canal stenosis or neural foraminal narrowing.          Assessment & Plan:   Chronic lower back pain bilateral but worse on the right without radiculopathy -No improvement with Tylenol, unable to take NSAIDs due to cardiac history -She has some tenderness over her right SI joint however do not think this is the main source of her pain at this time -Pain is primarily axial in her lower back -Foods for pain -list was provided last visit -ORT low -TENS unit, next wave device ordered last visit -Continue Norco 7.5 twice daily as needed, #30 ordered -Advised her to bring pill bottle for pill count next visit -Cymbalta was an option however she is doing very well on her Celexa and would like to avoid changing this if possible.  -Will place consult for physical therapy -UDS and pain agreement completed prior visit -Continue Robaxin as needed -Discussed facet joint injections L3-4, L4-5, L5-S1, will refer to Dr. Wynn Banker     Chronic anxiety -Currently on Celexa, could consider SNRI however would need to wean off Celexa first and this appears to be working very well for her currently

## 2022-08-24 LAB — BASIC METABOLIC PANEL
BUN/Creatinine Ratio: 17 (ref 12–28)
CO2: 23 mmol/L (ref 20–29)
Chloride: 102 mmol/L (ref 96–106)
Glucose: 148 mg/dL — ABNORMAL HIGH (ref 70–99)
Potassium: 5.2 mmol/L (ref 3.5–5.2)
eGFR: 46 mL/min/{1.73_m2} — ABNORMAL LOW (ref >59)

## 2022-08-24 LAB — CBC
Hemoglobin: 9.6 g/dL — ABNORMAL LOW (ref 11.1–15.9)
MCV: 71 fL — ABNORMAL LOW (ref 79–97)
RBC: 4.58 x10E6/uL (ref 3.77–5.28)
WBC: 8.3 10*3/uL (ref 3.4–10.8)

## 2022-08-24 NOTE — Progress Notes (Signed)
Anti-Coagulation Progress Note  Kimberly Hobbs is a 62 y.o. female who is currently on an anti-coagulation regimen for  history of mechanical mitral and aortic valve replacement, paroxysmal atrial fibrillation  RECENT RESULTS: Lab Results  Component Value Date   INR 2.3 08/19/2022   INR 2.8 08/09/2022   INR 2.8 08/09/2022    ANTI-COAG DOSE: Description   08/19/2022  INR today was 2.3. Patient goal level is (2.5-3.5)  Current dose was 7.5 mg on Monday and 5 mg all the other days.  Patient is to continue same dose of  7.5 mg on Monday and 5 mg all the other days.   Patient was advised to change to 7.5 Mon and Wed, and 5mg  all other days, and patient is currently taking aspirin 81mg .   Pt will recheck INR 1 week on 08/26/2022.  Lab Results      Component                Value               Date                      INR                      2.3                 08/19/2022                INR                      2.8                 08/09/2022                INR                      2.8                 08/09/2022            Status post mechanical aortic & mitral valve replacement  (primary encounter diagnosis) Plan: POCT INR  Monitoring for long-term anticoagulant use      ASSESSMENT:   ICD-10-CM   1. H/O mitral valve replacement with mechanical #25 mechanical SJM 01/01/2017)  Z95.2     2. Status post mechanical 19 mm  mechanical regent AV replacement 01/01/2017  Z95.2 POCT INR    CANCELED: EKG 12-Lead    3. Monitoring for long-term anticoagulant use  Z51.81    Z79.01     4. 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  I08.0

## 2022-08-25 NOTE — Pre-Procedure Instructions (Signed)
Patient is scheduled for procedure on Thursday 7/18 with anesthesia.  Anesthesia requires certain medications to be held.  Jardiance- last dose should be Sunday 7/14, don't take any on 7/15, 7/16 or 7/17, procedure is scheduled on 7/18.  Attempted to call patient reguarding the above instructions.  Left voicemail.

## 2022-08-26 ENCOUNTER — Encounter: Payer: Self-pay | Admitting: Nurse Practitioner

## 2022-08-26 ENCOUNTER — Ambulatory Visit: Payer: 59 | Admitting: Cardiology

## 2022-08-26 ENCOUNTER — Other Ambulatory Visit: Payer: Self-pay

## 2022-08-26 ENCOUNTER — Encounter: Payer: Self-pay | Admitting: Cardiology

## 2022-08-26 DIAGNOSIS — I480 Paroxysmal atrial fibrillation: Secondary | ICD-10-CM | POA: Diagnosis not present

## 2022-08-26 DIAGNOSIS — Z7901 Long term (current) use of anticoagulants: Secondary | ICD-10-CM | POA: Diagnosis not present

## 2022-08-26 DIAGNOSIS — Z952 Presence of prosthetic heart valve: Secondary | ICD-10-CM

## 2022-08-26 DIAGNOSIS — Z5181 Encounter for therapeutic drug level monitoring: Secondary | ICD-10-CM | POA: Diagnosis not present

## 2022-08-26 LAB — POCT INR: INR: 2.5 (ref 2.0–3.0)

## 2022-08-26 NOTE — Progress Notes (Signed)
Description   08/26/2022  INR today was 2.5. Patient goal level is (2.5-3.5)  Prior dose was 7.5 Mon and Wed, and 5mg  all other days, and patient is currently taking aspirin 81mg .   Patient will continue same dose 7.5 Mon and Wed, and 5mg  all other days, and patient is currently taking aspirin 81mg .   Pt will recheck INR 1 week on 09/02/2022 (Ablasion 09/05/22)  Lab Results      Component                Value               Date                      INR                      2.3                 08/19/2022                INR                      2.8                 08/09/2022                INR                      2.8                 08/09/2022            Status post mechanical aortic & mitral valve replacement  (primary encounter diagnosis) Plan: POCT INR  Monitoring for long-term anticoagulant use

## 2022-08-28 ENCOUNTER — Telehealth (HOSPITAL_COMMUNITY): Payer: Self-pay | Admitting: *Deleted

## 2022-08-28 ENCOUNTER — Other Ambulatory Visit: Payer: Self-pay | Admitting: Nurse Practitioner

## 2022-08-28 NOTE — Telephone Encounter (Signed)
Reaching out to patient to offer assistance regarding upcoming cardiac imaging study; pt verbalizes understanding of appt date/time, parking situation and where to check in, pre-test NPO status and medications ordered, and verified current allergies; name and call back number provided for further questions should they arise  Larey Brick RN Navigator Cardiac Imaging [facility] [phone] office [phone] cell  Patient verbalized of how to take 13 hour prep.

## 2022-08-29 ENCOUNTER — Ambulatory Visit (HOSPITAL_COMMUNITY)
Admission: RE | Admit: 2022-08-29 | Discharge: 2022-08-29 | Disposition: A | Discharge status: Home / Self Care | Payer: 59 | Source: Ambulatory Visit | Attending: Cardiology | Admitting: Cardiology

## 2022-08-29 ENCOUNTER — Other Ambulatory Visit: Payer: Self-pay

## 2022-08-29 DIAGNOSIS — I4819 Other persistent atrial fibrillation: Secondary | ICD-10-CM | POA: Diagnosis not present

## 2022-08-29 DIAGNOSIS — D6869 Other thrombophilia: Secondary | ICD-10-CM | POA: Insufficient documentation

## 2022-08-29 DIAGNOSIS — I10 Essential (primary) hypertension: Secondary | ICD-10-CM | POA: Insufficient documentation

## 2022-08-29 DIAGNOSIS — I2510 Atherosclerotic heart disease of native coronary artery without angina pectoris: Secondary | ICD-10-CM | POA: Diagnosis not present

## 2022-08-29 DIAGNOSIS — G4733 Obstructive sleep apnea (adult) (pediatric): Secondary | ICD-10-CM | POA: Diagnosis not present

## 2022-08-29 MED ORDER — IOHEXOL 350 MG/ML SOLN
95.0000 mL | Freq: Once | INTRAVENOUS | Status: AC | PRN
  Administered 2022-08-29: 95 mL via INTRAVENOUS

## 2022-08-29 MED ORDER — DIPHENHYDRAMINE HCL 50 MG/ML IJ SOLN
INTRAMUSCULAR | Status: AC
  Filled 2022-08-29: qty 1

## 2022-08-29 MED ORDER — ROSUVASTATIN CALCIUM 40 MG PO TABS: 40.0000 mg | ORAL_TABLET | Freq: Every day | ORAL | 1 refills | Status: DC

## 2022-08-30 DIAGNOSIS — G4733 Obstructive sleep apnea (adult) (pediatric): Secondary | ICD-10-CM | POA: Diagnosis not present

## 2022-09-02 ENCOUNTER — Ambulatory Visit: Payer: 59 | Admitting: Cardiology

## 2022-09-02 ENCOUNTER — Telehealth: Payer: Self-pay | Admitting: Cardiology

## 2022-09-02 DIAGNOSIS — Z952 Presence of prosthetic heart valve: Secondary | ICD-10-CM

## 2022-09-02 DIAGNOSIS — Z5181 Encounter for therapeutic drug level monitoring: Secondary | ICD-10-CM

## 2022-09-02 LAB — POCT INR
INR: 2.4 (ref 2.0–3.0)
INR: 2.4 (ref 2.0–3.0)

## 2022-09-02 NOTE — Telephone Encounter (Signed)
Pt has appt for Heart Ablation on Thursday but she accidentally took her Jardiance this morning. She'd like to know will she have to r/s the procedure or will she be okay to still have it done as long as she doesn't take it again. Pt is requesting a callback. Please advise

## 2022-09-02 NOTE — Progress Notes (Signed)
Description   09/02/2022    INR today was 2.4. Patient goal level is (2.5-3.5)  Prior dose was 7.5 mg Monday, Wednesday and 5 mg all the other days.  Patient will now start on 7.5 mg Monday, Tuesday, Wednesday, and 5 mg all the other days. For this week due that patient is getting ATRIAL FIBRILLATION ABLATION.    Pt will recheck INR 1 weeks on 07/22.     Status post mechanical aortic & mitral valve replacement  (primary encounter diagnosis) Plan: POCT INR  Monitoring for long-term anticoagulant use

## 2022-09-02 NOTE — Telephone Encounter (Signed)
Discussed w/ Kimberly Hobbs in the cath lab.  Advised pt ok to proceed Thursday, but not to take anymore after today. Patient verbalized understanding and agreeable to plan.

## 2022-09-02 NOTE — Telephone Encounter (Signed)
Pt made aware Dr. Elberta Fortis reviewed. OK to proceed with planned procedure later this week. Pt appreciates my follow up call.

## 2022-09-03 ENCOUNTER — Ambulatory Visit: Payer: 59 | Admitting: Nurse Practitioner

## 2022-09-03 ENCOUNTER — Ambulatory Visit: Payer: 59 | Admitting: Family Medicine

## 2022-09-03 ENCOUNTER — Other Ambulatory Visit: Payer: Self-pay | Admitting: Cardiology

## 2022-09-03 ENCOUNTER — Other Ambulatory Visit: Payer: 59

## 2022-09-03 ENCOUNTER — Encounter: Payer: Self-pay | Admitting: Family Medicine

## 2022-09-03 ENCOUNTER — Other Ambulatory Visit: Payer: Self-pay

## 2022-09-03 VITALS — BP 120/68 | HR 68 | Temp 97.9°F | Ht 65.0 in | Wt 211.6 lb

## 2022-09-03 DIAGNOSIS — G4733 Obstructive sleep apnea (adult) (pediatric): Secondary | ICD-10-CM | POA: Diagnosis not present

## 2022-09-03 DIAGNOSIS — I2510 Atherosclerotic heart disease of native coronary artery without angina pectoris: Secondary | ICD-10-CM

## 2022-09-03 DIAGNOSIS — G8929 Other chronic pain: Secondary | ICD-10-CM

## 2022-09-03 DIAGNOSIS — M545 Low back pain, unspecified: Secondary | ICD-10-CM | POA: Diagnosis not present

## 2022-09-03 DIAGNOSIS — R0609 Other forms of dyspnea: Secondary | ICD-10-CM

## 2022-09-03 LAB — POC URINALSYSI DIPSTICK (AUTOMATED)
Bilirubin, UA: NEGATIVE
Blood, UA: POSITIVE
Glucose, UA: POSITIVE — AB
Ketones, UA: NEGATIVE
Leukocytes, UA: NEGATIVE
Nitrite, UA: NEGATIVE
Protein, UA: NEGATIVE
Spec Grav, UA: 1.015 (ref 1.010–1.025)
Urobilinogen, UA: NEGATIVE U/dL — AB
pH, UA: 6 (ref 5.0–8.0)

## 2022-09-03 MED ORDER — STIOLTO RESPIMAT 2.5-2.5 MCG/ACT IN AERS: 2.0000 | INHALATION_SPRAY | Freq: Every day | RESPIRATORY_TRACT | 6 refills | Status: DC

## 2022-09-03 NOTE — Telephone Encounter (Signed)
She was supposed to have 3 month follow up with myself or APP to follow up CPAP download. This is something we need to address in person. Please schedule patient

## 2022-09-03 NOTE — Progress Notes (Signed)
Assessment/Plan:   Problem List Items Addressed This Visit       Other   Chronic right-sided low back pain without sciatica - Primary    Right Lower Back Pain: New onset, worse since yesterday. Pain is in the same location as previous arthritis-related back pain. No associated fever, nausea, vomiting, recent falls or trauma. Recent Urinary Tract Infection, treated with an unknown antibiotic two weeks ago. No current urinary symptoms and urinalysis today showed no signs of infection. -Continue current pain management regimen at home. -Watch for worsening symptoms or new symptoms such as nausea, vomiting, fever, or right upper back pain. Provided reassurance that buti, returne precautions discussed          Relevant Orders   POCT Urinalysis Dipstick (Automated) (Completed)    There are no discontinued medications.  No follow-ups on file.    Subjective:   Encounter date: 09/03/2022  Delaila Nand is a 62 y.o. 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; H/O mitral valve replacement with mechanical #25 mechanical SJM 01/01/2017); Coronary artery disease of bypass graft of native heart with stable angina pectoris (HCC); Pulmonary hypertension, unspecified (HCC); Angina pectoris (HCC); Coronary artery disease involving coronary bypass graft of native heart with angina pectoris (HCC); Dyspnea on exertion; Hx of CABG; COPD (chronic obstructive pulmonary disease) (HCC); Long term (current) use of anticoagulants; Encounter for therapeutic drug monitoring; Monitoring for long-term anticoagulant use; Injury of right elbow; GAD (generalized anxiety disorder); Type 2 diabetes mellitus with hyperglycemia, without long-term current use of insulin (HCC); Paroxysmal atrial fibrillation  (HCC); CKD stage 3 due to type 2 diabetes mellitus (HCC); Hemorrhoids; Cervical strain; Sensorineural hearing loss (SNHL) of both ears; Chronic right-sided low back pain without sciatica; DDD (degenerative disc disease), lumbosacral; Stenosis of lateral recess of lumbar spine; Acute on chronic heart failure with preserved ejection fraction (HCC); Vasomotor rhinitis; and Iron deficiency anemia on their problem list..   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   She presents with chief complaint of Abdominal Pain (Right side pain started yesterday morning ) .  Discussed the use of AI scribe software for clinical note transcription with the patient, who gave verbal consent to proceed.  History of Present Illness   The patient, with a history of diabetes, arthritis, and a recent urinary tract infection (UTI), presents with right-sided lower back pain that started the previous day. The pain has progressively worsened. The patient denies any precipitating factors for the pain. They were treated for a UTI two weeks prior, but the details of the treatment are unclear. The patient denies any urinary symptoms at present, suggesting resolution of the UTI.  The patient is scheduled for a heart operation in the near future and is concerned about potential infections prior to the procedure. They have a history of contrast allergy and had to discontinue metformin for a recent imaging study. The patient also reports having arthritis in the lower back, which could be contributing to the current pain. They deny any recent falls  or new symptoms. The patient manages  their pain with medication at home.       Review of Systems  Constitutional:  Negative for chills and fever.  Gastrointestinal: Negative.   Genitourinary:  Negative for dysuria, flank pain, frequency, hematuria and urgency.  Musculoskeletal:  Positive for back pain. Negative for falls.    Past Surgical History:  Procedure Laterality Date   CARDIAC CATHETERIZATION     CARDIOVERSION N/A 04/02/2022   Procedure: CARDIOVERSION;  Surgeon: Elder Negus, MD;  Location: [facility] ENDOSCOPY;  Service: Cardiovascular;  Laterality: N/A;   CARDIOVERSION N/A 04/27/2022   Procedure: CARDIOVERSION;  Surgeon: Elder Negus, MD;  Location: [facility] ENDOSCOPY;  Service: Cardiovascular;  Laterality: N/A;   CHOLECYSTECTOMY     CORONARY/GRAFT ANGIOGRAPHY N/A 08/18/2018   Procedure: CORONARY/GRAFT ANGIOGRAPHY;  Surgeon: Elder Negus, MD;  Location: [facility] INVASIVE CV LAB;  Service: Cardiovascular;  Laterality: N/A;   LEG SURGERY     "metal plate in leg"   RIGHT HEART CATH AND CORONARY/GRAFT ANGIOGRAPHY N/A 07/21/2018   Procedure: RIGHT HEART CATH AND CORONARY/GRAFT ANGIOGRAPHY;  Surgeon: Elder Negus, MD;  Location: [facility] INVAS[facility] CV [facility];  Service: Cardiovascular;  Laterality: N/A;   TEE WITHOUT CARDIOVERSION N/A 07/27/2015   Procedure: TRANSESOPHAGEAL ECHOCARDIOGRAM (TEE);  Surgeon: Thurmon Fair, MD;  Location: [facility] ENDOSCOPY;  Service: Cardiovascular;  Laterality: N/A;   TEE WITHOUT CARDIOVERSION N/A 07/21/2018   Procedure: TRANSESOPHAGEAL ECHOCARDIOGRAM (TEE);  Surgeon: Elder Negus, MD;  Location: [facility] ENDOSCOPY;  Service: Cardiovascular;  Laterality: N/A;   TONSILLECTOMY AND ADENOIDECTOMY      Outpatient Medications Prior to Visit  Medication Sig Dispense Refill   Accu-Chek Softclix Lancets lancets Use as instructed 100 each 12   albuterol (VENTOLIN HFA) 108 (90 Base) MCG/ACT inhaler Inhale 2 puffs into the lungs every 6 (six) hours as needed for wheezing  or shortness of breath.     aspirin EC 81 MG tablet Take 81 mg by mouth daily.      azelastine (ASTELIN) 0.1 % nasal spray Place 1 spray into both nostrils 2 (two) times daily. Use in each nostril as directed (Patient taking differently: Place 1 spray into both nostrils daily as needed for allergies. Use in each nostril as directed) 30 mL 12   Blood Glucose Monitoring Suppl (ACCU-CHEK GUIDE) w/Device KIT Check glucose every morning 1 kit 0   clonazePAM (KLONOPIN) 0.5 MG tablet Take 1.5 tablets (0.75 mg total) by mouth at bedtime. 45 tablet 2   cyclobenzaprine (FLEXERIL) 10 MG tablet Take 1 tablet (10 mg total) by mouth at bedtime. (Patient taking differently: Take 10 mg by mouth at bedtime as needed for muscle spasms.) 30 tablet 0   diclofenac Sodium (VOLTAREN ARTHRITIS PAIN) 1 % GEL Apply 4 g topically 4 (four) times daily. (Patient taking differently: Apply 4 g topically 4 (four) times daily as needed (pain).) 150 g 5   diltiazem (CARDIZEM CD) 180 MG 24 hr capsule TAKE 1 CAPSULE BY MOUTH DAILY 90 capsule 2   empagliflozin (JARDIANCE) 10 MG TABS tablet TAKE 1 TABLET(10 MG) BY MOUTH DAILY BEFORE BREAKFAST 90 tablet 3   Ferrous Sulfate (IRON PO) Take 1 tablet by mouth daily. Blood builder     furosemide (LASIX) 20 MG tablet TAKE 2 TABLETS(40 MG) BY MOUTH TWICE DAILY 60 tablet 3   glucose blood (ACCU-CHEK GUIDE) test strip Use as instructed 100 each 12   HYDROcodone-acetaminophen (NORCO) 7.5-325 MG tablet Take 1 tablet by mouth every 12 (twelve) hours as needed for moderate pain. (Patient taking differently: Take  1 tablet by mouth every 6 (six) hours as needed for moderate pain.) 30 tablet 0   ipratropium (ATROVENT) 0.03 % nasal spray Place 1 spray into both nostrils 2 (two) times daily as needed for rhinitis. 30 mL 0   isosorbide mononitrate (IMDUR) 60 MG 24 hr tablet TAKE 1 TABLET(60 MG) BY MOUTH DAILY (Patient taking differently: Take 60 mg by mouth in the morning.) 90 tablet 3   levalbuterol  (XOPENEX HFA) 45 MCG/ACT inhaler Inhale 1-2 puffs into the lungs every 6 (six) hours as needed for shortness of breath. 1 each 12   levalbuterol (XOPENEX) 0.63 MG/3ML nebulizer solution Take 3 mLs (0.63 mg total) by nebulization every 4 (four) hours as needed for wheezing or shortness of breath. 3 mL 12   losartan (COZAAR) 50 MG tablet TAKE 1 TABLET(50 MG) BY MOUTH DAILY 90 tablet 3   metFORMIN (GLUCOPHAGE) 500 MG tablet TAKE 1 TABLET(500 MG) BY MOUTH TWICE DAILY WITH A MEAL 180 tablet 3   methocarbamol (ROBAXIN) 500 MG tablet Take 1 tablet (500 mg total) by mouth every 8 (eight) hours as needed for muscle spasms. 40 tablet 0   metoprolol succinate (TOPROL-XL) 25 MG 24 hr tablet TAKE 1 TABLET(25 MG) BY MOUTH DAILY WITH OR IMMEDIATELY FOLLOWING A MEAL 90 tablet 3   montelukast (SINGULAIR) 10 MG tablet Take 1 tablet (10 mg total) by mouth daily. 30 tablet 11   nitroGLYCERIN (NITROSTAT) 0.4 MG SL tablet DISSOLVE 1 TABLET UNDER THE TONGUE EVERY 5 MINUTES FOR 3 DOSES AS NEEDED FOR CHEST PAIN 25 tablet 3   pantoprazole (PROTONIX) 40 MG tablet Take 1 tablet (40 mg total) by mouth 2 (two) times daily before a meal. (Patient taking differently: Take 40 mg by mouth daily.) 180 tablet 3   potassium chloride SA (KLOR-CON M) 20 MEQ tablet Take 1 tablet (20 mEq total) by mouth 2 (two) times daily. (Patient taking differently: Take 40 mEq by mouth daily.) 180 tablet 3   rosuvastatin (CRESTOR) 40 MG tablet Take 1 tablet (40 mg total) by mouth daily. 90 tablet 1   spironolactone (ALDACTONE) 50 MG tablet Take 1 tablet (50 mg total) by mouth daily. 90 tablet 3   tiotropium (SPIRIVA HANDIHALER) 18 MCG inhalation capsule Place 18 mcg into inhaler and inhale daily.     traZODone (DESYREL) 50 MG tablet Take 1 tablet (50 mg total) by mouth at bedtime as needed for sleep. 30 tablet 5   warfarin (COUMADIN) 5 MG tablet Take 7.5 mg on Monday and Wed and 5 mg all other days (Patient taking differently: Take 5 mg by mouth See admin  instructions. Take Thursday, Friday, Saturday and Sunday at bedtime) 90 tablet 3   warfarin (COUMADIN) 7.5 MG tablet Take 7.5 mg on Monday and Wednesday and 5 mg all other days (Patient taking differently: Take 7.5 mg by mouth See admin instructions. Take 7.5 mg on Monday, Tuesday and Wednesday at bedtime) 90 tablet 3   Tiotropium Bromide-Olodaterol (STIOLTO RESPIMAT) 2.5-2.5 MCG/ACT AERS Inhale 2 puffs into the lungs daily. 4 g 0   No facility-administered medications prior to visit.    Family History  Problem Relation Age of Onset   Cancer Mother    Hypertension Mother    Diabetes Mother    Hyperlipidemia Mother    Heart disease Mother    Cancer Father    Hypertension Father    Diabetes Father    Heart disease Father    Hypertension Sister    Hypertension Brother  Hypertension Brother    Schizophrenia Maternal Grandmother     Social History   Socioeconomic History   Marital status: Divorced    Spouse name: Not on file   Number of children: 4   Years of education: Not on file   Highest education level: GED or equivalent  Occupational History   Not on file  Tobacco Use   Smoking status: Former    Current packs/day: 0.00    Average packs/day: 1.5 packs/day for 40.0 years (60.0 ttl pk-yrs)    Types: Cigarettes    Start date: 12/23/1976    Quit date: 12/23/2016    Years since quitting: 5.6   Smokeless tobacco: Never  Vaping Use   Vaping status: Never Used  Substance and Sexual Activity   Alcohol use: Not Currently   Drug use: No   Sexual activity: Not Currently  Other Topics Concern   Not on file  Social History Narrative   ** Merged History Encounter **       Epworth Sleepiness Scale = 4 (as of 06/28/2015)   Social Determinants of Health   Financial Resource Strain: Low Risk  (08/30/2022)   Overall Financial Resource Strain (CARDIA)    Difficulty of Paying Living Expenses: Not hard at all  Food Insecurity: No Food Insecurity (08/30/2022)   Hunger Vital Sign     Worried About Running Out of Food in the Last Year: Never true    Ran Out of Food in the Last Year: Never true  Transportation Needs: No Transportation Needs (08/30/2022)   PRAPARE - Administrator, Civil Service (Medical): No    Lack of Transportation (Non-Medical): No  Physical Activity: Inactive (08/30/2022)   Exercise Vital Sign    Days of Exercise per Week: 0 days    Minutes of Exercise per Session: 0 min  Stress: Stress Concern Present (08/30/2022)   [employer] of Occupational Health - Occupational Stress Questionnaire    Feeling of Stress : Rather much  Social Connections: Moderately Isolated (08/30/2022)   Social Connection and Isolation Panel [NHANES]    Frequency of Communication with Friends and Family: More than three times a week    Frequency of Social Gatherings with Friends and Family: Once a week    Attends Religious Services: Never    Database administrator or Organizations: No    Attends Engineer, structural: 1 to 4 times per year    Marital Status: Divorced  Intimate Partner Violence: Not At Risk (10/10/2021)   Humiliation, Afraid, Rape, and Kick questionnaire    Fear of Current or Ex-Partner: No    Emotionally Abused: No    Physically Abused: No    Sexually Abused: No                                                                                                  Objective:  Physical Exam: BP 120/68   Pulse 68   Temp 97.9 F (36.6 C) (Temporal)   Ht 5\' 5"  (1.651 m)   Wt 211 lb 9.6 oz (96 kg)   LMP 06/20/2015  SpO2 98%   BMI 35.21 kg/m     Physical Exam Constitutional:      General: She is not in acute distress.    Appearance: Normal appearance. She is not ill-appearing or toxic-appearing.  HENT:     Head: Normocephalic and atraumatic.     Nose: Nose normal. No congestion.  Eyes:     General: No scleral icterus.    Extraocular Movements: Extraocular movements intact.  Cardiovascular:     Rate and Rhythm: Normal rate  and regular rhythm.     Pulses: Normal pulses.     Heart sounds: Normal heart sounds.  Pulmonary:     Effort: Pulmonary effort is normal. No respiratory distress.     Breath sounds: Normal breath sounds.  Abdominal:     General: Abdomen is flat. Bowel sounds are normal.     Palpations: Abdomen is soft.     Tenderness: There is no abdominal tenderness. There is no right CVA tenderness or left CVA tenderness.  Musculoskeletal:        General: Normal range of motion.     Lumbar back: Tenderness (Right lower, mild) present.  Lymphadenopathy:     Cervical: No cervical adenopathy.  Skin:    General: Skin is warm and dry.     Findings: No rash.  Neurological:     General: No focal deficit present.     Mental Status: She is alert and oriented to person, place, and time. Mental status is at baseline.  Psychiatric:        Mood and Affect: Mood normal.        Behavior: Behavior normal.        Thought Content: Thought content normal.        Judgment: Judgment normal.     CT CARDIAC MORPH/PULM VEIN W/CM&W/O CA SCORE  Result Date: 08/29/2022 CLINICAL DATA:  Atrial fibrillation scheduled for ablation. EXAM: Cardiac CTA TECHNIQUE: A non-contrast, gated CT scan was obtained with axial slices of 3 mm through the heart for calcium scoring. Calcium scoring was performed using the Agatston method. A 120 kV retrospective, gated, contrast cardiac scan was obtained. Gantry rotation speed was 250 msecs and collimation was 0.6 mm. Nitroglycerin was not given. A delayed scan was obtained to exclude left atrial appendage thrombus. The 3D dataset was reconstructed in 5% intervals of the 0-95% of the R-R cycle. Late systolic phases were analyzed on a dedicated workstation using MPR, MIP, and VRT modes. The patient received 80 cc of contrast. FINDINGS: Image quality: Excellent. Noise artifact is: Limited. Pulmonary Veins: There is normal pulmonary vein drainage into the left atrium (2 on the right and 2 on the left)  with ostial measurements as follows: RUPV: Ostium 18.0 mm x 14.3 mm  area 1.63 cm2 RLPV:  Ostium 11.1 mm x 7.54 mm  area 0.534 cm2 LUPV:  Ostium 20.1 mm x 18.2 mm area 2.70 cm2 LLPV:  Ostium 21.9 mm x 16.8 mm  area 2.64 cm2 Left Atrium: The left atrial size is normal. There is no PFO/ASD. The left atrial appendage is large broccoli type with two lobes. There is no thrombus in the left atrial appendage on contrast or delayed imaging. The esophagus runs in the left atrial midline and is not in proximity to any of the pulmonary vein ostia. Coronary Arteries: CAC score of 922, which is 99 percentile for age-, race-, and sex-matched controls. Normal coronary origin. Right dominance. The study was performed without use of NTG and is insufficient for  plaque evaluation. Right Atrium: Right atrial size is within normal limits. Right Ventricle: The right ventricular cavity is within normal limits. Left Ventricle: The ventricular cavity size is within normal limits. There are no stigmata of prior infarction. There is no abnormal filling defect. Pericardium: Normal thickness with no significant effusion or calcium present. Pulmonary Artery: Normal caliber without proximal filling defect. Cardiac valves: The aortic valve is trileaflet without significant calcification. The mitral valve is normal structure without significant calcification. Aorta: Normal caliber with no significant disease. Noted: Mechanical aortic valve, s/p CABG (with makers in place). Extra-cardiac findings: See attached radiology report for non-cardiac structures. IMPRESSION: 1. There is normal pulmonary vein drainage into the left atrium with ostial measurements above. 2. There is no thrombus in the left atrial appendage. 3. The esophagus runs in the left atrial midline and is not in proximity to any of the pulmonary vein ostia. 4. No PFO/ASD. 5. Normal coronary origin. Right dominance. 6. CAC score of 922 which is 99 percentile for age-, race-, and  sex-matched controls. Thomasene Ripple, DO [facility] The noncardiac portion of this study will be interpreted in separate report by the radiologist. Electronically Signed   By: Thomasene Ripple D.O.   On: 08/29/2022 14:22    Recent Results (from the past 2160 hour(s))  ToxAssure Select,+Antidepr,UR     Status: None   Collection Time: 06/10/22 10:30 AM  Result Value Ref Range   Summary Note     Comment: ==================================================================== ToxAssure Select,+Antidepr,UR ==================================================================== Test                             Result       Flag       Units  Drug Present   7-aminoclonazepam              320                     ng/mg creat    7-aminoclonazepam is an expected metabolite of clonazepam. Source of    clonazepam is a scheduled prescription medication.    Citalopram                     PRESENT   Desmethylcitalopram            PRESENT    Desmethylcitalopram is an expected metabolite of citalopram or the    enantiomeric form, escitalopram.  ==================================================================== Test                      Result    Flag   Units      Ref Range   Creatinine              20               mg/dL      >=16 ==================================================================== Declared Medications:  Medication list was not provided. =============================== ===================================== For clinical consultation, please call [phone]. ====================================================================   POCT INR     Status: Abnormal   Collection Time: 06/13/22  9:04 AM  Result Value Ref Range   INR 3.8 (A) 2.0 - 3.0   POC INR    POCT INR     Status: None   Collection Time: 06/27/22  9:12 AM  Result Value Ref Range   POC INR 3.2   POCT INR     Status: None   Collection Time: 07/26/22  1:45 PM  Result Value Ref Range   INR 2.6 2.0 - 3.0   POC INR    B12     Status:  None   Collection Time: 07/29/22 10:48 AM  Result Value Ref Range   Vitamin B-12 394 211 - 911 pg/mL  IBC + Ferritin     Status: Abnormal   Collection Time: 07/29/22 10:48 AM  Result Value Ref Range   Iron 34 (L) 42 - 145 ug/dL   Transferrin 696.2 (H) 212.0 - 360.0 mg/dL   Saturation Ratios 6.1 (L) 20.0 - 50.0 %   Ferritin 19.8 10.0 - 291.0 ng/mL   TIBC 557.2 (H) 250.0 - 450.0 mcg/dL  Microalbumin / creatinine urine ratio     Status: None   Collection Time: 07/29/22 10:48 AM  Result Value Ref Range   Microalb, Ur <0.7 0.0 - 1.9 mg/dL   Creatinine,U 95.2 mg/dL   Microalb Creat Ratio 4.0 0.0 - 30.0 mg/g  CBC with Differential/Platelet     Status: Abnormal   Collection Time: 07/29/22 10:48 AM  Result Value Ref Range   WBC 8.1 4.0 - 10.5 K/uL   RBC 4.52 3.87 - 5.11 Mil/uL   Hemoglobin 9.4 (L) 12.0 - 15.0 g/dL   HCT 84.1 (L) 32.4 - 40.1 %   MCV 69.2 Repeated and verified X2. (L) 78.0 - 100.0 fl   MCHC 30.2 30.0 - 36.0 g/dL   RDW 02.7 (H) 25.3 - 66.4 %   Platelets 283.0 150.0 - 400.0 K/uL   Neutrophils Relative % 62.3 43.0 - 77.0 %   Lymphocytes Relative 22.7 12.0 - 46.0 %   Monocytes Relative 8.2 3.0 - 12.0 %   Eosinophils Relative 6.3 (H) 0.0 - 5.0 %   Basophils Relative 0.5 0.0 - 3.0 %   Neutro Abs 5.0 1.4 - 7.7 K/uL   Lymphs Abs 1.8 0.7 - 4.0 K/uL   Monocytes Absolute 0.7 0.1 - 1.0 K/uL   Eosinophils Absolute 0.5 0.0 - 0.7 K/uL   Basophils Absolute 0.0 0.0 - 0.1 K/uL  Renal Function Panel     Status: Abnormal   Collection Time: 07/29/22 10:48 AM  Result Value Ref Range   Sodium 138 135 - 145 mEq/L   Potassium 5.0 3.5 - 5.1 mEq/L   Chloride 106 96 - 112 mEq/L   CO2 22 19 - 32 mEq/L   Albumin 4.6 3.5 - 5.2 g/dL   BUN 21 6 - 23 mg/dL   Creatinine, Ser 4.03 (H) 0.40 - 1.20 mg/dL   Glucose, Bld 474 (H) 70 - 99 mg/dL   Phosphorus 4.1 2.3 - 4.6 mg/dL   GFR 25.95 (L) >63.87 mL/min    Comment: Calculated using the CKD-EPI Creatinine Equation (2021)   Calcium 9.6 8.4 - 10.5  mg/dL  POCT INR     Status: None   Collection Time: 08/09/22 11:30 AM  Result Value Ref Range   INR 2.8 2.0 - 3.0   POC INR 2.8   POCT INR     Status: None   Collection Time: 08/19/22  2:37 PM  Result Value Ref Range   INR 2.3 2.0 - 3.0   POC INR    CBC     Status: Abnormal   Collection Time: 08/23/22 12:12 PM  Result Value Ref Range   WBC 8.3 3.4 - 10.8 x10E3/uL   RBC 4.58 3.77 - 5.28 x10E6/uL   Hemoglobin 9.6 (L) 11.1 - 15.9 g/dL   Hematocrit 56.4 (L) 33.2 - 46.6 %   MCV 71 (  L) 79 - 97 fL   MCH 21.0 (L) 26.6 - 33.0 pg   MCHC 29.4 (L) 31.5 - 35.7 g/dL   RDW 16.1 (H) 09.6 - 04.5 %   Platelets 250 150 - 450 x10E3/uL  Basic metabolic panel     Status: Abnormal   Collection Time: 08/23/22 12:12 PM  Result Value Ref Range   Glucose 148 (H) 70 - 99 mg/dL   BUN 23 8 - 27 mg/dL   Creatinine, Ser 4.09 (H) 0.57 - 1.00 mg/dL   eGFR 46 (L) >81 XB/JYN/8.29   BUN/Creatinine Ratio 17 12 - 28   Sodium 139 134 - 144 mmol/L   Potassium 5.2 3.5 - 5.2 mmol/L   Chloride 102 96 - 106 mmol/L   CO2 23 20 - 29 mmol/L   Calcium 9.5 8.7 - 10.3 mg/dL  POCT INR     Status: None   Collection Time: 08/26/22  3:58 PM  Result Value Ref Range   INR 2.5 2.0 - 3.0   POC INR    POCT INR     Status: None   Collection Time: 09/02/22 12:00 AM  Result Value Ref Range   INR 2.4 2.0 - 3.0  POCT INR     Status: None   Collection Time: 09/02/22  3:41 PM  Result Value Ref Range   INR 2.4 2.0 - 3.0   POC INR    POCT Urinalysis Dipstick (Automated)     Status: Abnormal   Collection Time: 09/03/22  4:08 PM  Result Value Ref Range   Color, UA light yellow    Clarity, UA clear    Glucose, UA Positive (A) Negative    Comment: 3+   Bilirubin, UA Negative    Ketones, UA Negative    Spec Grav, UA 1.015 1.010 - 1.025   Blood, UA Positive    pH, UA 6.0 5.0 - 8.0   Protein, UA Negative Negative   Urobilinogen, UA negative (A) 0.2 or 1.0 E.U./dL   Nitrite, UA Negative    Leukocytes, UA Negative Negative         Garner Nash, MD, MS

## 2022-09-03 NOTE — Assessment & Plan Note (Signed)
Right Lower Back Pain: New onset, worse since yesterday. Pain is in the same location as previous arthritis-related back pain. No associated fever, nausea, vomiting, recent falls or trauma. Recent Urinary Tract Infection, treated with an unknown antibiotic two weeks ago. No current urinary symptoms and urinalysis today showed no signs of infection. -Continue current pain management regimen at home. -Watch for worsening symptoms or new symptoms such as nausea, vomiting, fever, or right upper back pain. Provided reassurance that buti, returne precautions discussed

## 2022-09-04 NOTE — Pre-Procedure Instructions (Signed)
Instructed patient on the following items: Arrival time 0515 Nothing to eat or drink after midnight No meds AM of procedure Responsible person to drive you home and stay with you for 24 hrs  Have you missed any doses of anti-coagulant Coumadin- takes once a day, hasn't missed any doses.

## 2022-09-05 ENCOUNTER — Ambulatory Visit (HOSPITAL_BASED_OUTPATIENT_CLINIC_OR_DEPARTMENT_OTHER): Payer: 59 | Admitting: Anesthesiology

## 2022-09-05 ENCOUNTER — Encounter (HOSPITAL_COMMUNITY)
Admission: RE | Disposition: A | Discharge status: Home / Self Care | Payer: 59 | Source: Ambulatory Visit | Attending: Cardiology

## 2022-09-05 ENCOUNTER — Ambulatory Visit (HOSPITAL_COMMUNITY): Payer: 59 | Admitting: Anesthesiology

## 2022-09-05 ENCOUNTER — Other Ambulatory Visit: Payer: Self-pay

## 2022-09-05 ENCOUNTER — Ambulatory Visit (HOSPITAL_COMMUNITY)
Admission: RE | Admit: 2022-09-05 | Discharge: 2022-09-05 | Disposition: A | Discharge status: Home / Self Care | Payer: 59 | Source: Ambulatory Visit | Attending: Cardiology | Admitting: Cardiology

## 2022-09-05 DIAGNOSIS — N183 Chronic kidney disease, stage 3 unspecified: Secondary | ICD-10-CM

## 2022-09-05 DIAGNOSIS — I4819 Other persistent atrial fibrillation: Secondary | ICD-10-CM | POA: Insufficient documentation

## 2022-09-05 DIAGNOSIS — Z952 Presence of prosthetic heart valve: Secondary | ICD-10-CM | POA: Insufficient documentation

## 2022-09-05 DIAGNOSIS — I25119 Atherosclerotic heart disease of native coronary artery with unspecified angina pectoris: Secondary | ICD-10-CM | POA: Diagnosis not present

## 2022-09-05 DIAGNOSIS — F17210 Nicotine dependence, cigarettes, uncomplicated: Secondary | ICD-10-CM | POA: Diagnosis not present

## 2022-09-05 DIAGNOSIS — I350 Nonrheumatic aortic (valve) stenosis: Secondary | ICD-10-CM | POA: Insufficient documentation

## 2022-09-05 DIAGNOSIS — I130 Hypertensive heart and chronic kidney disease with heart failure and stage 1 through stage 4 chronic kidney disease, or unspecified chronic kidney disease: Secondary | ICD-10-CM

## 2022-09-05 DIAGNOSIS — I5033 Acute on chronic diastolic (congestive) heart failure: Secondary | ICD-10-CM

## 2022-09-05 DIAGNOSIS — I2510 Atherosclerotic heart disease of native coronary artery without angina pectoris: Secondary | ICD-10-CM | POA: Diagnosis not present

## 2022-09-05 DIAGNOSIS — I272 Pulmonary hypertension, unspecified: Secondary | ICD-10-CM | POA: Insufficient documentation

## 2022-09-05 DIAGNOSIS — E1122 Type 2 diabetes mellitus with diabetic chronic kidney disease: Secondary | ICD-10-CM | POA: Insufficient documentation

## 2022-09-05 DIAGNOSIS — J449 Chronic obstructive pulmonary disease, unspecified: Secondary | ICD-10-CM | POA: Diagnosis not present

## 2022-09-05 DIAGNOSIS — I480 Paroxysmal atrial fibrillation: Secondary | ICD-10-CM

## 2022-09-05 DIAGNOSIS — Z87891 Personal history of nicotine dependence: Secondary | ICD-10-CM

## 2022-09-05 DIAGNOSIS — Z951 Presence of aortocoronary bypass graft: Secondary | ICD-10-CM | POA: Diagnosis not present

## 2022-09-05 HISTORY — PX: ATRIAL FIBRILLATION ABLATION: EP1191

## 2022-09-05 LAB — GLUCOSE, CAPILLARY
Glucose-Capillary: 129 mg/dL — ABNORMAL HIGH (ref 70–99)
Glucose-Capillary: 108 mg/dL — ABNORMAL HIGH (ref 70–99)
Glucose-Capillary: 109 mg/dL — ABNORMAL HIGH (ref 70–99)

## 2022-09-05 LAB — PROTIME-INR
INR: 2.7 — ABNORMAL HIGH (ref 0.8–1.2)
Prothrombin Time: 28.9 seconds — ABNORMAL HIGH (ref 11.4–15.2)

## 2022-09-05 LAB — POCT ACTIVATED CLOTTING TIME
Activated Clotting Time: 330 seconds
Activated Clotting Time: 342 seconds

## 2022-09-05 SURGERY — ATRIAL FIBRILLATION ABLATION
Anesthesia: General

## 2022-09-05 MED ORDER — ACETAMINOPHEN 325 MG PO TABS
ORAL_TABLET | ORAL | Status: AC
  Administered 2022-09-05: 650 mg via ORAL
  Filled 2022-09-05: qty 2

## 2022-09-05 MED ORDER — HEPARIN (PORCINE) IN NACL 1000-0.9 UT/500ML-% IV SOLN
INTRAVENOUS | Status: DC | PRN
  Administered 2022-09-05 (×3): 500 mL

## 2022-09-05 MED ORDER — ROCURONIUM BROMIDE 10 MG/ML (PF) SYRINGE
PREFILLED_SYRINGE | INTRAVENOUS | Status: DC | PRN
  Administered 2022-09-05: 60 mg via INTRAVENOUS

## 2022-09-05 MED ORDER — FENTANYL CITRATE (PF) 100 MCG/2ML IJ SOLN
INTRAMUSCULAR | Status: DC | PRN
  Administered 2022-09-05: 100 ug via INTRAVENOUS

## 2022-09-05 MED ORDER — DOBUTAMINE INFUSION FOR EP/ECHO/NUC (1000 MCG/ML)
INTRAVENOUS | Status: DC | PRN
  Administered 2022-09-05: 20 ug/kg/min via INTRAVENOUS

## 2022-09-05 MED ORDER — ONDANSETRON HCL 4 MG/2ML IJ SOLN: 4.0000 mg | Freq: Four times a day (QID) | INTRAMUSCULAR | Status: DC | PRN

## 2022-09-05 MED ORDER — DEXAMETHASONE SODIUM PHOSPHATE 10 MG/ML IJ SOLN
INTRAMUSCULAR | Status: DC | PRN
  Administered 2022-09-05: 5 mg via INTRAVENOUS

## 2022-09-05 MED ORDER — PHENYLEPHRINE HCL-NACL 20-0.9 MG/250ML-% IV SOLN
INTRAVENOUS | Status: DC | PRN
  Administered 2022-09-05: 25 ug/min via INTRAVENOUS

## 2022-09-05 MED ORDER — LIDOCAINE 2% (20 MG/ML) 5 ML SYRINGE
INTRAMUSCULAR | Status: DC | PRN
  Administered 2022-09-05: 60 mg via INTRAVENOUS

## 2022-09-05 MED ORDER — HEPARIN SODIUM (PORCINE) 1000 UNIT/ML IJ SOLN
INTRAMUSCULAR | Status: DC | PRN
  Administered 2022-09-05: 1000 [IU] via INTRAVENOUS

## 2022-09-05 MED ORDER — SODIUM CHLORIDE 0.9 % IV SOLN: INTRAVENOUS | Status: DC

## 2022-09-05 MED ORDER — EPHEDRINE SULFATE-NACL 50-0.9 MG/10ML-% IV SOSY
PREFILLED_SYRINGE | INTRAVENOUS | Status: DC | PRN
  Administered 2022-09-05: 10 mg via INTRAVENOUS

## 2022-09-05 MED ORDER — ACETAMINOPHEN 325 MG PO TABS: 650.0000 mg | ORAL_TABLET | ORAL | Status: DC | PRN

## 2022-09-05 MED ORDER — SODIUM CHLORIDE 0.9% FLUSH: 3.0000 mL | INTRAVENOUS | Status: DC | PRN

## 2022-09-05 MED ORDER — HEPARIN SODIUM (PORCINE) 1000 UNIT/ML IJ SOLN
INTRAMUSCULAR | Status: DC | PRN
  Administered 2022-09-05: 14000 [IU] via INTRAVENOUS
  Administered 2022-09-05: 1000 [IU] via INTRAVENOUS

## 2022-09-05 MED ORDER — DOBUTAMINE INFUSION FOR EP/ECHO/NUC (1000 MCG/ML)
INTRAVENOUS | Status: AC
  Filled 2022-09-05: qty 250

## 2022-09-05 MED ORDER — PHENYLEPHRINE 80 MCG/ML (10ML) SYRINGE FOR IV PUSH (FOR BLOOD PRESSURE SUPPORT)
PREFILLED_SYRINGE | INTRAVENOUS | Status: DC | PRN
  Administered 2022-09-05: 160 ug via INTRAVENOUS
  Administered 2022-09-05: 80 ug via INTRAVENOUS
  Administered 2022-09-05: 160 ug via INTRAVENOUS

## 2022-09-05 MED ORDER — ONDANSETRON HCL 4 MG/2ML IJ SOLN
INTRAMUSCULAR | Status: DC | PRN
Start: 2022-09-05 — End: 2022-09-05
  Administered 2022-09-05: 4 mg via INTRAVENOUS

## 2022-09-05 MED ORDER — SODIUM CHLORIDE 0.9 % IV SOLN: 250.0000 mL | INTRAVENOUS | Status: DC | PRN

## 2022-09-05 MED ORDER — PROTAMINE SULFATE 10 MG/ML IV SOLN
INTRAVENOUS | Status: DC | PRN
  Administered 2022-09-05: 40 mg via INTRAVENOUS

## 2022-09-05 MED ORDER — HEPARIN SODIUM (PORCINE) 1000 UNIT/ML IJ SOLN
INTRAMUSCULAR | Status: AC
  Filled 2022-09-05: qty 10

## 2022-09-05 MED ORDER — SUGAMMADEX SODIUM 200 MG/2ML IV SOLN
INTRAVENOUS | Status: DC | PRN
  Administered 2022-09-05: 200 mg via INTRAVENOUS

## 2022-09-05 MED ORDER — PROPOFOL 10 MG/ML IV BOLUS
INTRAVENOUS | Status: DC | PRN
Start: 2022-09-05 — End: 2022-09-05
  Administered 2022-09-05: 150 mg via INTRAVENOUS

## 2022-09-05 MED ORDER — MIDAZOLAM HCL 2 MG/2ML IJ SOLN
INTRAMUSCULAR | Status: DC | PRN
  Administered 2022-09-05: 2 mg via INTRAVENOUS

## 2022-09-05 SURGICAL SUPPLY — 21 items
BAG SNAP BAND KOVER 36X36 (MISCELLANEOUS) IMPLANT
BLANKET WARM UNDERBOD FULL ACC (MISCELLANEOUS) ×1 IMPLANT
CATH ABLAT QDOT MICRO BI TC DF (CATHETERS) IMPLANT
CATH OCTARAY 2.0 F 3-3-3-3-3 (CATHETERS) IMPLANT
CATH PIGTAIL STEERABLE D1 8.7 (WIRE) IMPLANT
CATH S-M CIRCA TEMP PROBE (CATHETERS) IMPLANT
CATH SOUNDSTAR ECO 8FR (CATHETERS) IMPLANT
CATH WEB BI DIR CSDF CRV REPRO (CATHETERS) IMPLANT
CLOSURE MYNX CONTROL 6F/7F (Vascular Products) IMPLANT
CLOSURE PERCLOSE PROSTYLE (VASCULAR PRODUCTS) IMPLANT
COVER SWIFTLINK CONNECTOR (BAG) ×1 IMPLANT
PACK EP LATEX FREE (CUSTOM PROCEDURE TRAY) ×1
PACK EP LF (CUSTOM PROCEDURE TRAY) ×1 IMPLANT
PAD DEFIB RADIO PHYSIO CONN (PAD) ×1 IMPLANT
PATCH CARTO3 (PAD) IMPLANT
SHEATH CARTO VIZIGO SM CVD (SHEATH) IMPLANT
SHEATH PINNACLE 7F 10CM (SHEATH) IMPLANT
SHEATH PINNACLE 8F 10CM (SHEATH) IMPLANT
SHEATH PINNACLE 9F 10CM (SHEATH) IMPLANT
SHEATH PROBE COVER 6X72 (BAG) IMPLANT
TUBING SMART ABLATE COOLFLOW (TUBING) IMPLANT

## 2022-09-05 NOTE — Anesthesia Postprocedure Evaluation (Signed)
Anesthesia Post Note  Patient: Kimberly Hobbs  Procedure(s) Performed: ATRIAL FIBRILLATION ABLATION     Patient location during evaluation: Cath Lab Anesthesia Type: General Level of consciousness: awake Pain management: pain level controlled Vital Signs Assessment: post-procedure vital signs reviewed and stable Respiratory status: spontaneous breathing, nonlabored ventilation and respiratory function stable Cardiovascular status: blood pressure returned to baseline and stable Postop Assessment: no apparent nausea or vomiting Anesthetic complications: no   There were no known notable events for this encounter.  Last Vitals:  Vitals:   09/05/22 1230 09/05/22 1300  BP: (!) 111/50 (!) 118/47  Pulse: 64 67  Resp: 16 15  Temp:    SpO2: 97% 94%    Last Pain:  Vitals:   09/05/22 1230  TempSrc:   PainSc: 3                  [name]

## 2022-09-05 NOTE — Transfer of Care (Signed)
Immediate Anesthesia Transfer of Care Note  Patient: Kimberly Hobbs  Procedure(s) Performed: ATRIAL FIBRILLATION ABLATION  Patient Location: Cath Lab  Anesthesia Type:General  Level of Consciousness: awake, drowsy, and patient cooperative  Airway & Oxygen Therapy: Patient Spontanous Breathing and Patient connected to nasal cannula oxygen  Post-op Assessment: Report given to RN and Post -op Vital signs reviewed and stable  Post vital signs: Reviewed and stable  Last Vitals:  Vitals Value Taken Time  BP 103/40 09/05/22 0953  Temp    Pulse 72 09/05/22 0956  Resp 17 09/05/22 0956  SpO2 95 % 09/05/22 0956  Vitals shown include unfiled device data.  Last Pain:  Vitals:   09/05/22 0555  PainSc: 4          Complications: There were no known notable events for this encounter.

## 2022-09-05 NOTE — Anesthesia Preprocedure Evaluation (Addendum)
Anesthesia Evaluation  Patient identified by MRN, date of birth, ID band Patient awake    Reviewed: Allergy & Precautions, NPO status , Patient's Chart, lab work & pertinent test results  Airway Mallampati: I  TM Distance: >3 FB Neck ROM: Full    Dental  (+) Upper Dentures, Edentulous Lower   Pulmonary sleep apnea and Continuous Positive Airway Pressure Ventilation , COPD,  COPD inhaler, former smoker   Pulmonary exam normal        Cardiovascular hypertension, Pt. on medications and Pt. on home beta blockers + angina  + CAD, + CABG and +CHF  Normal cardiovascular exam+ dysrhythmias Atrial Fibrillation   Echocardiogram 04/05/2022: Mildly depressed LV systolic function with EF 53%. Left ventricle cavity is normal in size. Normal global wall motion. Calculated EF 53%. Mechanical trileaflet aortic valve.  No aortic valve regurgitation. AVA (VTI) measures 1.4 cm^2. AV Mean Grad measures 20.6 mmHg. AV Pk Vel measures 3.39 m/s. Mechanical mitral valve.  No mitral valve regurgitation. E-wave dominant mitral inflow. Structurally normal tricuspid valve.  Mild tricuspid regurgitation. RVSP measures 35 mmHg. IVC is dilated with respiratory variation. Normally functioning prosthetic valves.     Neuro/Psych  PSYCHIATRIC DISORDERS Anxiety      Neuromuscular disease    GI/Hepatic negative GI ROS, Neg liver ROS,,,  Endo/Other  diabetes, Oral Hypoglycemic Agents    Renal/GU Renal disease     Musculoskeletal  (+) Arthritis ,    Abdominal  (+) + obese  Peds  Hematology  (+) Blood dyscrasia (Warfarin), anemia INR:2.7   Anesthesia Other Findings A-fib  Reproductive/Obstetrics                             Anesthesia Physical Anesthesia Plan  ASA: 4  Anesthesia Plan: General   Post-op Pain Management:    Induction: Intravenous  PONV Risk Score and Plan: 3 and Ondansetron, Dexamethasone, Midazolam and  Treatment may vary due to age or medical condition  Airway Management Planned: Oral ETT  Additional Equipment:   Intra-op Plan:   Post-operative Plan: Extubation in OR  Informed Consent: I have reviewed the patients History and Physical, chart, labs and discussed the procedure including the risks, benefits and alternatives for the proposed anesthesia with the patient or authorized representative who has indicated his/her understanding and acceptance.     Dental advisory given  Plan Discussed with: CRNA  Anesthesia Plan Comments:        Anesthesia Quick Evaluation

## 2022-09-05 NOTE — Discharge Instructions (Signed)

## 2022-09-05 NOTE — H&P (Signed)
Electrophysiology Office Note   Date:  09/05/2022   ID:  Carmin, Alvidrez 12/23/1960, MRN [id number]  PCP:  Anne Ng, NP  Cardiologist:  Rosemary Holms Primary Electrophysiologist:  Kessler Kopinski Jorja Loa, MD    Chief Complaint: AF   History of Present Illness: Kimberly Hobbs is a 62 y.o. female who is being seen today for the evaluation of AF at the request of No ref. provider found. Presenting today for electrophysiology evaluation.  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  She was admitted to the hospital March 2024 with acute on chronic diastolic heart failure and rapid atrial fibrillation.  She had previously failed amiodarone.  She had a cardioversion 04/26/2021.  Cardioversion she has felt well.  When she was in atrial fibrillation she had significant fatigue, weakness, shortness of breath.  Today, denies symptoms of palpitations, chest pain, shortness of breath, orthopnea, PND, lower extremity edema, claudication, dizziness, presyncope, syncope, bleeding, or neurologic sequela. The patient is tolerating medications without difficulties. Plan ablation today for AF.    Past Medical History:  Diagnosis Date   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   COVID-19    H/O mitral valve replacement with mechanical #25 mechanical SJM 01/01/2017) 07/17/2018   Hyperlipidemia    Hypertension    Pain in both lower extremities 07/23/2019   Paroxysmal atrial fibrillation (HCC) 06/29/2015   Rheumatic heart disease    MS/ AS   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 01/01/2017 01/01/2017   Past Surgical History:  Procedure Laterality Date   CARDIAC CATHETERIZATION     CARDIOVERSION N/A 04/02/2022   Procedure: CARDIOVERSION;  Surgeon:  Elder Negus, MD;  Location: [facility] ENDOSCOPY;  Service: Cardiovascular;  Laterality: N/A;   CARDIOVERSION N/A 04/27/2022   Procedure: CARDIOVERSION;  Surgeon: Elder Negus, MD;  Location: [facility] ENDOSCOPY;  Service: Cardiovascular;  Laterality: N/A;   CHOLECYSTECTOMY     CORONARY/GRAFT ANGIOGRAPHY N/A 08/18/2018   Procedure: CORONARY/GRAFT ANGIOGRAPHY;  Surgeon: Elder Negus, MD;  Location: [facility] INVASIVE CV LAB;  Service: Cardiovascular;  Laterality: N/A;   LEG SURGERY     "metal plate in leg"   RIGHT HEART CATH AND CORONARY/GRAFT ANGIOGRAPHY N/A 07/21/2018   Procedure: RIGHT HEART CATH AND CORONARY/GRAFT ANGIOGRAPHY;  Surgeon: Elder Negus, MD;  Location: [facility] INVASIVE CV LAB;  Service: Cardiovascular;  Laterality: N/A;   TEE WITHOUT CARDIOVERSION N/A 07/27/2015   Procedure: TRANSESOPHAGEAL ECHOCARDIOGRAM (TEE);  Surgeon: Thurmon Fair, MD;  Location: [facility] ENDOSCOPY;  Service: Cardiovascular;  Laterality: N/A;   TEE WITHOUT CARDIOVERSION N/A 07/21/2018   Procedure: TRANSESOPHAGEAL ECHOCARDIOGRAM (TEE);  Surgeon: Elder Negus, MD;  Location: [facility] ENDOSCOPY;  Service: Cardiovascular;  Laterality: N/A;   TONSILLECTOMY AND ADENOIDECTOMY       Current Facility-Administered Medications  Medication Dose Route Frequency Provider Last Rate Last Admin   0.9 %  sodium chloride infusion   Intravenous Continuous Regan Lemming, MD 50 mL/hr at 09/05/22 0558 New Bag at 09/05/22 0558    Allergies:   Iodinated contrast media and Penicillins   Social History:  The patient  reports that she quit smoking about 5 years ago. Her smoking use included cigarettes. She started smoking about 45 years ago. She has a 60 pack-year smoking history. She has never used smokeless tobacco. She reports  that she does not currently use alcohol. She reports that she does not use drugs.   Family History:  The patient's family history includes Cancer in her father and mother; Diabetes in her father and  mother; Heart disease in her father and mother; Hyperlipidemia in her mother; Hypertension in her brother, brother, father, mother, and sister; Schizophrenia in her maternal grandmother.   ROS:  Please see the history of present illness.   Otherwise, review of systems is positive for none.   All other systems are reviewed and negative.   PHYSICAL EXAM: VS:  BP (!) 153/53   Pulse 77   Temp 98 F (36.7 C)   Resp 18   Ht 5\' 5"  (1.651 m)   Wt 94.8 kg   LMP 06/20/2015   SpO2 98%   BMI 34.78 kg/m  , BMI Body mass index is 34.78 kg/m. GEN: Well nourished, well developed, in no acute distress  HEENT: normal  Neck: no JVD, carotid bruits, or masses Cardiac: RRR; no murmurs, rubs, or gallops,no edema  Respiratory:  clear to auscultation bilaterally, normal work of breathing GI: soft, nontender, nondistended, + BS MS: no deformity or atrophy  Skin: warm and dry Neuro:  Strength and sensation are intact Psych: euthymic mood, full affect  Recent Labs: 03/07/2022: ALT 25; TSH 3.050 04/27/2022: Magnesium 2.5 08/23/2022: BUN 23; Creatinine, Ser 1.33; Hemoglobin 9.6; Platelets 250; Potassium 5.2; Sodium 139    Lipid Panel     Component Value Date/Time   CHOL 116 05/10/2021 0950   TRIG 115.0 05/10/2021 0950   HDL 45.50 05/10/2021 0950   CHOLHDL 3 05/10/2021 0950   VLDL 23.0 05/10/2021 0950   LDLCALC 48 05/10/2021 0950     Wt Readings from Last 3 Encounters:  09/05/22 94.8 kg  09/03/22 96 kg  08/23/22 94.8 kg      Other studies Reviewed: Additional studies/ records that were reviewed today include: TTE 04/08/22  Review of the above records today demonstrates:  Mildly depressed LV systolic function with EF 53%. Left ventricle cavity  is normal in size. Normal global wall motion. Calculated EF 53%.  Mechanical trileaflet aortic valve.  No aortic valve regurgitation. AVA  (VTI) measures 1.4 cm^2. AV Mean Grad measures 20.6 mmHg. AV Pk Vel  measures 3.39 m/s.  Mechanical mitral valve.   No mitral valve regurgitation. E-wave dominant  mitral inflow.  Structurally normal tricuspid valve.  Mild tricuspid regurgitation. RVSP  measures 35 mmHg.  IVC is dilated with respiratory variation.  Normally functioning prosthetic valves.    ASSESSMENT AND PLAN:  1.  Persistent atrial fibrillation: Aubreanna Percle has presented today for surgery, with the diagnosis of AF.  The various methods of treatment have been discussed with the patient and family. After consideration of risks, benefits and other options for treatment, the patient has consented to  Procedure(s): Catheter ablation as a surgical intervention .  Risks include but not limited to complete heart block, stroke, esophageal damage, nerve damage, bleeding, vascular damage, tamponade, perforation, MI, and death. The patient's history has been reviewed, patient examined, no change in status, stable for surgery.  I have reviewed the patient's chart and labs.  Questions were answered to the patient's satisfaction.    Eman Rynders Elberta Fortis, MD 09/05/2022 7:07 AM

## 2022-09-05 NOTE — Anesthesia Procedure Notes (Signed)
Procedure Name: Intubation Date/Time: 09/05/2022 7:43 AM  Performed by: Gus Puma, CRNAPre-anesthesia Checklist: Patient identified, Emergency Drugs available, Suction available and Patient being monitored Patient Re-evaluated:Patient Re-evaluated prior to induction Oxygen Delivery Method: Circle System Utilized Preoxygenation: Pre-oxygenation with 100% oxygen Induction Type: IV induction Ventilation: Mask ventilation without difficulty Laryngoscope Size: Mac and 3 Grade View: Grade I Tube type: Oral Tube size: 7.0 mm Number of attempts: 1 Airway Equipment and Method: Stylet and Oral airway Placement Confirmation: ETT inserted through vocal cords under direct vision, positive ETCO2 and breath sounds checked- equal and bilateral Secured at: 21 cm Tube secured with: Tape Dental Injury: Teeth and Oropharynx as per pre-operative assessment

## 2022-09-09 ENCOUNTER — Encounter: Payer: Self-pay | Admitting: Cardiology

## 2022-09-09 ENCOUNTER — Encounter (HOSPITAL_COMMUNITY): Payer: Self-pay | Admitting: Cardiology

## 2022-09-09 NOTE — Telephone Encounter (Signed)
From patient.

## 2022-09-09 NOTE — Telephone Encounter (Signed)
Continue same dose warfarin.  Thanks MJP

## 2022-09-10 ENCOUNTER — Telehealth: Payer: Self-pay

## 2022-09-10 ENCOUNTER — Telehealth (HOSPITAL_COMMUNITY): Payer: Self-pay | Admitting: *Deleted

## 2022-09-10 NOTE — Telephone Encounter (Signed)
Followed up w/ pt. Pt already spoke w/ afib clinic this morning. She had not taken her morning medications and was advised to do so. Upon follow up pt reports improvement in BP/HRs. Currently 114/57, HR 76. She is aware if reoccurs and concerned, she needs to call the office/afib clinic and not send in a mychart message. Patient verbalized understanding and agreeable to plan.

## 2022-09-10 NOTE — Telephone Encounter (Signed)
If rate <120, I would continue current medications. Do not hink need cardioversion unless rate uncontrolled. As in the past, it is entirely possible that you may come out of it by yourself.  Thanks MJP

## 2022-09-10 NOTE — Telephone Encounter (Signed)
See separate telephone encounter.

## 2022-09-10 NOTE — Telephone Encounter (Signed)
Patient called in back in afib this morning. Anxious on the call. Educated pt on post-ablation breakthrough afib overall feels ok just can feel her heart racing. She has not taken her morning medications (cardizem, metoprolol). Instructed patient to take morning medications and call back in a few hours with update of HR/BP. Pt in agreement.

## 2022-09-10 NOTE — Telephone Encounter (Signed)
Patient call and mention that she is having some episodes of her heart racing. Patient mention that her heart rate has been around 102. Denies chest pain. Please advise

## 2022-09-10 NOTE — Telephone Encounter (Signed)
Called patient and Patient is aware

## 2022-09-10 NOTE — Telephone Encounter (Signed)
Called patient to inform her about the message above patient understood

## 2022-09-11 ENCOUNTER — Telehealth: Payer: Self-pay

## 2022-09-11 DIAGNOSIS — M6281 Muscle weakness (generalized): Secondary | ICD-10-CM | POA: Diagnosis not present

## 2022-09-11 DIAGNOSIS — M545 Low back pain, unspecified: Secondary | ICD-10-CM | POA: Diagnosis not present

## 2022-09-11 NOTE — Telephone Encounter (Signed)
Spoke with patient regarding prior message.Advised patient order has been placed for Heated humidifier for CPAP. Patient would like her CPAP pressure changed per Patient is blowing to much.  Dr.Desai can you please advise.  Thank you

## 2022-09-12 ENCOUNTER — Encounter: Payer: Self-pay | Admitting: Cardiology

## 2022-09-13 ENCOUNTER — Other Ambulatory Visit: Payer: Self-pay

## 2022-09-13 DIAGNOSIS — R079 Chest pain, unspecified: Secondary | ICD-10-CM

## 2022-09-13 DIAGNOSIS — J310 Chronic rhinitis: Secondary | ICD-10-CM | POA: Diagnosis not present

## 2022-09-13 DIAGNOSIS — J449 Chronic obstructive pulmonary disease, unspecified: Secondary | ICD-10-CM | POA: Diagnosis not present

## 2022-09-13 MED ORDER — ISOSORBIDE MONONITRATE ER 60 MG PO TB24
60.0000 mg | ORAL_TABLET | Freq: Every day | ORAL | 3 refills | Status: DC
Start: 2022-09-13 — End: 2023-06-18

## 2022-09-13 NOTE — Telephone Encounter (Signed)
I cannot advise her on this until she has a CPAP titration study to evaluate her sleep apnea. Her current settings are auto-cpap which means the pressure will change from breath to breath. I believe this has been ordered?

## 2022-09-16 DIAGNOSIS — M545 Low back pain, unspecified: Secondary | ICD-10-CM | POA: Diagnosis not present

## 2022-09-16 DIAGNOSIS — M6281 Muscle weakness (generalized): Secondary | ICD-10-CM | POA: Diagnosis not present

## 2022-09-18 ENCOUNTER — Encounter (INDEPENDENT_AMBULATORY_CARE_PROVIDER_SITE_OTHER): Payer: Self-pay

## 2022-09-20 NOTE — Telephone Encounter (Signed)
Patient aware and scheduled for study.

## 2022-09-23 DIAGNOSIS — Z952 Presence of prosthetic heart valve: Secondary | ICD-10-CM | POA: Diagnosis not present

## 2022-09-23 DIAGNOSIS — Z7901 Long term (current) use of anticoagulants: Secondary | ICD-10-CM | POA: Diagnosis not present

## 2022-09-23 DIAGNOSIS — I480 Paroxysmal atrial fibrillation: Secondary | ICD-10-CM | POA: Diagnosis not present

## 2022-09-24 ENCOUNTER — Encounter: Payer: Self-pay | Admitting: Nurse Practitioner

## 2022-09-24 ENCOUNTER — Ambulatory Visit (INDEPENDENT_AMBULATORY_CARE_PROVIDER_SITE_OTHER): Payer: 59 | Admitting: Nurse Practitioner

## 2022-09-24 ENCOUNTER — Ambulatory Visit (HOSPITAL_BASED_OUTPATIENT_CLINIC_OR_DEPARTMENT_OTHER): Payer: 59 | Admitting: Pulmonary Disease

## 2022-09-24 VITALS — BP 118/68 | HR 75 | Temp 98.3°F | Resp 16 | Ht 65.0 in | Wt 211.0 lb

## 2022-09-24 DIAGNOSIS — G4733 Obstructive sleep apnea (adult) (pediatric): Secondary | ICD-10-CM | POA: Diagnosis not present

## 2022-09-24 DIAGNOSIS — D500 Iron deficiency anemia secondary to blood loss (chronic): Secondary | ICD-10-CM | POA: Diagnosis not present

## 2022-09-24 DIAGNOSIS — I10 Essential (primary) hypertension: Secondary | ICD-10-CM | POA: Diagnosis not present

## 2022-09-24 DIAGNOSIS — E1169 Type 2 diabetes mellitus with other specified complication: Secondary | ICD-10-CM

## 2022-09-24 DIAGNOSIS — N183 Chronic kidney disease, stage 3 unspecified: Secondary | ICD-10-CM

## 2022-09-24 DIAGNOSIS — Z13220 Encounter for screening for lipoid disorders: Secondary | ICD-10-CM

## 2022-09-24 DIAGNOSIS — F411 Generalized anxiety disorder: Secondary | ICD-10-CM

## 2022-09-24 DIAGNOSIS — Z1231 Encounter for screening mammogram for malignant neoplasm of breast: Secondary | ICD-10-CM

## 2022-09-24 DIAGNOSIS — E1122 Type 2 diabetes mellitus with diabetic chronic kidney disease: Secondary | ICD-10-CM | POA: Diagnosis not present

## 2022-09-24 LAB — POCT GLYCOSYLATED HEMOGLOBIN (HGB A1C)
HbA1c POC (<> result, manual entry): 5.6 % (ref 4.0–5.6)
Hemoglobin A1C: 5.6 % (ref 4.0–5.6)

## 2022-09-24 MED ORDER — CLONAZEPAM 0.5 MG PO TABS
0.7500 mg | ORAL_TABLET | Freq: Every day | ORAL | 2 refills | Status: DC
Start: 2022-10-10 — End: 2022-12-23

## 2022-09-24 NOTE — Patient Instructions (Addendum)
HgbA1c at 5.6%: controlled DIABETES  Return stool sample to lab as soon as possible Schedule fasting lab appt. Need to be fasting 8hrs prior to blood draw. Ok to drink water and take BP meds. Continue Heart healthy diet and daily exercise. Maintain current medications. Schedule DIABETES eye exam. Have report faxed to me.

## 2022-09-24 NOTE — Assessment & Plan Note (Signed)
Stable with klonopin Refill sent

## 2022-09-24 NOTE — Assessment & Plan Note (Signed)
BP at goal with losartan, metoprolol, imdur, furosemide, diltiazem. Also under the care of cardiology BP Readings from Last 3 Encounters:  09/24/22 118/68  09/05/22 (!) 118/47  09/03/22 120/68

## 2022-09-24 NOTE — Progress Notes (Signed)
Established Patient Visit  Patient: Kimberly Hobbs   DOB: 06-Mar-1960   62 y.o. Female  MRN: [id number] Visit Date: 09/24/2022  Subjective:    Chief Complaint  Patient presents with   Diabetes   Hyperlipidemia    Not Fasting    CKD stage 3 due to type 2 diabetes mellitus (HCC) No edema BP and hgbA1c at goal Repeat BMP  DM (diabetes mellitus) (HCC) Repeat hgBA1c: 5.6% controlled with metformin. Advised to schedule appointment for DIABETES eye exam. She declined to schedule with mobile clinic. LDL at goal, current use of statin Maintain above meds F/up in 6months  Essential hypertension BP at goal with losartan, metoprolol, imdur, furosemide, diltiazem. Also under the care of cardiology BP Readings from Last 3 Encounters:  09/24/22 118/68  09/05/22 (!) 118/47  09/03/22 120/68     Iron deficiency anemia Secondary to CKD and/or GI bleed Advised to return stool for occult test. Maintain oral iron supplement Repeat cbc and iron panel  Reviewed medical, surgical, and social history today  Medications: Outpatient Medications Prior to Visit  Medication Sig   Accu-Chek Softclix Lancets lancets Use as instructed   albuterol (VENTOLIN HFA) 108 (90 Base) MCG/ACT inhaler Inhale 2 puffs into the lungs every 6 (six) hours as needed for wheezing or shortness of breath.   amiodarone (PACERONE) 200 MG tablet Take by mouth.   aspirin EC 81 MG tablet Take 81 mg by mouth daily.    azelastine (ASTELIN) 0.1 % nasal spray Place 1 spray into both nostrils 2 (two) times daily. Use in each nostril as directed (Patient taking differently: Place 1 spray into both nostrils daily as needed for allergies. Use in each nostril as directed)   Blood Glucose Monitoring Suppl (ACCU-CHEK GUIDE) w/Device KIT Check glucose every morning   clonazePAM (KLONOPIN) 0.5 MG tablet Take 1.5 tablets (0.75 mg total) by mouth at bedtime.   cyclobenzaprine (FLEXERIL) 10 MG tablet Take 1 tablet (10 mg  total) by mouth at bedtime. (Patient taking differently: Take 10 mg by mouth at bedtime as needed for muscle spasms.)   diclofenac Sodium (VOLTAREN ARTHRITIS PAIN) 1 % GEL Apply 4 g topically 4 (four) times daily. (Patient taking differently: Apply 4 g topically 4 (four) times daily as needed (pain).)   diltiazem (CARDIZEM CD) 180 MG 24 hr capsule TAKE 1 CAPSULE BY MOUTH DAILY   empagliflozin (JARDIANCE) 10 MG TABS tablet TAKE 1 TABLET(10 MG) BY MOUTH DAILY BEFORE BREAKFAST   Ferrous Sulfate (IRON PO) Take 1 tablet by mouth daily. Blood builder   furosemide (LASIX) 20 MG tablet TAKE 2 TABLETS(40 MG) BY MOUTH TWICE DAILY   glucose blood (ACCU-CHEK GUIDE) test strip Use as instructed   HYDROcodone-acetaminophen (NORCO) 7.5-325 MG tablet Take 1 tablet by mouth every 12 (twelve) hours as needed for moderate pain. (Patient taking differently: Take 1 tablet by mouth every 6 (six) hours as needed for moderate pain.)   ipratropium (ATROVENT) 0.03 % nasal spray Place 1 spray into both nostrils 2 (two) times daily as needed for rhinitis.   isosorbide mononitrate (IMDUR) 60 MG 24 hr tablet Take 1 tablet (60 mg total) by mouth daily.   levalbuterol (XOPENEX HFA) 45 MCG/ACT inhaler Inhale 1-2 puffs into the lungs every 6 (six) hours as needed for shortness of breath.   levalbuterol (XOPENEX) 0.63 MG/3ML nebulizer solution Take 3 mLs (0.63 mg total) by nebulization every 4 (four) hours as needed  for wheezing or shortness of breath.   losartan (COZAAR) 50 MG tablet TAKE 1 TABLET(50 MG) BY MOUTH DAILY   metFORMIN (GLUCOPHAGE) 500 MG tablet TAKE 1 TABLET(500 MG) BY MOUTH TWICE DAILY WITH A MEAL   methocarbamol (ROBAXIN) 500 MG tablet Take 1 tablet (500 mg total) by mouth every 8 (eight) hours as needed for muscle spasms.   metoprolol succinate (TOPROL-XL) 25 MG 24 hr tablet TAKE 1 TABLET(25 MG) BY MOUTH DAILY WITH OR IMMEDIATELY FOLLOWING A MEAL   montelukast (SINGULAIR) 10 MG tablet Take 1 tablet (10 mg total) by  mouth daily.   nitrofurantoin, macrocrystal-monohydrate, (MACROBID) 100 MG capsule Take 100 mg by mouth 2 (two) times daily.   nitroGLYCERIN (NITROSTAT) 0.4 MG SL tablet DISSOLVE 1 TABLET UNDER THE TONGUE EVERY 5 MINUTES FOR 3 DOSES AS NEEDED FOR CHEST PAIN   pantoprazole (PROTONIX) 40 MG tablet Take 1 tablet (40 mg total) by mouth 2 (two) times daily before a meal. (Patient taking differently: Take 40 mg by mouth daily.)   phenazopyridine (PYRIDIUM) 100 MG tablet Take 100 mg by mouth 3 (three) times daily.   potassium chloride SA (KLOR-CON M) 20 MEQ tablet Take 1 tablet (20 mEq total) by mouth 2 (two) times daily. (Patient taking differently: Take 40 mEq by mouth daily.)   rosuvastatin (CRESTOR) 40 MG tablet Take 1 tablet (40 mg total) by mouth daily.   spironolactone (ALDACTONE) 50 MG tablet Take 1 tablet (50 mg total) by mouth daily.   tiotropium (SPIRIVA HANDIHALER) 18 MCG inhalation capsule Place 18 mcg into inhaler and inhale daily.   Tiotropium Bromide-Olodaterol (STIOLTO RESPIMAT) 2.5-2.5 MCG/ACT AERS Inhale 2 puffs into the lungs daily.   traZODone (DESYREL) 50 MG tablet Take 1 tablet (50 mg total) by mouth at bedtime as needed for sleep.   warfarin (COUMADIN) 5 MG tablet Take 7.5 mg on Monday and Wed and 5 mg all other days (Patient taking differently: Take 5 mg by mouth See admin instructions. Take Thursday, Friday, Saturday and Sunday at bedtime)   warfarin (COUMADIN) 7.5 MG tablet Take 7.5 mg on Monday and Wednesday and 5 mg all other days (Patient taking differently: Take 7.5 mg by mouth See admin instructions. Take 7.5 mg on Monday, Tuesday and Wednesday at bedtime)   No facility-administered medications prior to visit.   Reviewed past medical and social history.   ROS per HPI above  Last CBC Lab Results  Component Value Date   WBC 8.3 08/23/2022   HGB 9.6 (L) 08/23/2022   HCT 32.7 (L) 08/23/2022   MCV 71 (L) 08/23/2022   MCH 21.0 (L) 08/23/2022   RDW 17.1 (H) 08/23/2022    PLT 250 08/23/2022   Last metabolic panel Lab Results  Component Value Date   GLUCOSE 148 (H) 08/23/2022   NA 139 08/23/2022   K 5.2 08/23/2022   CL 102 08/23/2022   CO2 23 08/23/2022   BUN 23 08/23/2022   CREATININE 1.33 (H) 08/23/2022   EGFR 46 (L) 08/23/2022   CALCIUM 9.5 08/23/2022   PHOS 4.1 07/29/2022   PROT 7.5 03/07/2022   ALBUMIN 4.6 07/29/2022   LABGLOB 2.9 03/07/2022   AGRATIO 1.6 03/07/2022   BILITOT 0.3 03/07/2022   ALKPHOS 122 (H) 03/07/2022   AST 18 03/07/2022   ALT 25 03/07/2022   ANIONGAP 10 04/27/2022   Last lipids Lab Results  Component Value Date   CHOL 116 05/10/2021   HDL 45.50 05/10/2021   LDLCALC 48 05/10/2021   TRIG 115.0 05/10/2021  CHOLHDL 3 05/10/2021   Last hemoglobin A1c Lab Results  Component Value Date   HGBA1C 5.6 09/24/2022   HGBA1C 5.6 09/24/2022      Objective:  BP 118/68 (BP Location: Left Arm, Patient Position: Sitting, Cuff Size: Normal)   Pulse 75   Temp 98.3 F (36.8 C) (Temporal)   Resp 16   Ht 5\' 5"  (1.651 m)   Wt 211 lb (95.7 kg)   LMP 06/20/2015   SpO2 99%   BMI 35.11 kg/m      Physical Exam Vitals and nursing note reviewed.  Cardiovascular:     Rate and Rhythm: Normal rate and regular rhythm.     Heart sounds: Murmur heard.  Musculoskeletal:     Right lower leg: No edema.     Left lower leg: No edema.  Neurological:     Mental Status: She is alert and oriented to person, place, and time.     Results for orders placed or performed in visit on 09/24/22  POCT glycosylated hemoglobin (Hb A1C)  Result Value Ref Range   Hemoglobin A1C 5.6 4.0 - 5.6 %   HbA1c POC (<> result, manual entry) 5.6 4.0 - 5.6 %   HbA1c, POC (prediabetic range)     HbA1c, POC (controlled diabetic range)        Assessment & Plan:    Problem List Items Addressed This Visit       Cardiovascular and Mediastinum   Essential hypertension    BP at goal with losartan, metoprolol, imdur, furosemide, diltiazem. Also under the  care of cardiology BP Readings from Last 3 Encounters:  09/24/22 118/68  09/05/22 (!) 118/47  09/03/22 120/68         Relevant Medications   amiodarone (PACERONE) 200 MG tablet   Other Relevant Orders   Basic metabolic panel     Endocrine   CKD stage 3 due to type 2 diabetes mellitus (HCC)    No edema BP and hgbA1c at goal Repeat BMP      Relevant Orders   Basic metabolic panel   Hepatic function panel   DM (diabetes mellitus) (HCC) - Primary    Repeat hgBA1c: 5.6% controlled with metformin. Advised to schedule appointment for DIABETES eye exam. She declined to schedule with mobile clinic. LDL at goal, current use of statin Maintain above meds F/up in 6months      Relevant Orders   POCT glycosylated hemoglobin (Hb A1C) (Completed)   Hepatic function panel     Other   Iron deficiency anemia    Secondary to CKD and/or GI bleed Advised to return stool for occult test. Maintain oral iron supplement Repeat cbc and iron panel      Relevant Orders   Iron, TIBC and Ferritin Panel   Other Visit Diagnoses     Encounter for lipid screening for cardiovascular disease       Relevant Orders   Lipid panel   Breast cancer screening by mammogram       Relevant Orders   MM 3D SCREENING MAMMOGRAM BILATERAL BREAST      Return in about 6 months (around 03/27/2023) for HTN, DM, hyperlipidemia (fasting).     [name], NP

## 2022-09-24 NOTE — Assessment & Plan Note (Signed)
Secondary to CKD and/or GI bleed Advised to return stool for occult test. Maintain oral iron supplement Repeat cbc and iron panel

## 2022-09-24 NOTE — Assessment & Plan Note (Signed)
Repeat hgBA1c: 5.6% controlled with metformin. Advised to schedule appointment for DIABETES eye exam. She declined to schedule with mobile clinic. LDL at goal, current use of statin Maintain above meds F/up in 6months

## 2022-09-24 NOTE — Assessment & Plan Note (Signed)
No edema BP and hgbA1c at goal Repeat BMP

## 2022-09-25 ENCOUNTER — Other Ambulatory Visit: Payer: 59

## 2022-09-25 ENCOUNTER — Other Ambulatory Visit: Payer: Self-pay | Admitting: Nurse Practitioner

## 2022-09-25 DIAGNOSIS — G4733 Obstructive sleep apnea (adult) (pediatric): Secondary | ICD-10-CM | POA: Diagnosis not present

## 2022-09-25 NOTE — Procedures (Signed)
Patient Name: Kimberly Hobbs, Hinh Date: 09/24/2022 Gender: Female D.O.B: [birth year] Age (years): 11 Referring Provider: Durel Salts MD Height (inches): 65 Interpreting Physician: Cyril Mourning MD, ABSM Weight (lbs): 210 RPSGT: Armen Pickup BMI: 35 MRN: [id number] Neck Size: 16.00 <br> <br> CLINICAL INFORMATION The patient is referred for a CPAP titration to treat sleep apnea.    Date of HST: 04/2022 (watchpat) Moderate to Severe Obstructive Sleep Apnea with AHI 25.5/hr but 35.8/hr during REM sleep, low sat of 76%  SLEEP STUDY TECHNIQUE As per the AASM Manual for the Scoring of Sleep and Associated Events v2.3 (April 2016) with a hypopnea requiring 4% desaturations.  The channels recorded and monitored were frontal, central and occipital EEG, electrooculogram (EOG), submentalis EMG (chin), nasal and oral airflow, thoracic and abdominal wall motion, anterior tibialis EMG, snore microphone, electrocardiogram, and pulse oximetry. Continuous positive airway pressure (CPAP) was initiated at the beginning of the study and titrated to treat sleep-disordered breathing.  MEDICATIONS Medications self-administered by patient taken the night of the study : CLONAZEPAM, METFORMIN, NORCO, WARFARIN  TECHNICIAN COMMENTS Comments added by technician: Pt went to restroom twice. Patient had difficulty initiating sleep. Comments added by scorer: N/A RESPIRATORY PARAMETERS Optimal PAP Pressure (cm): 13 AHI at Optimal Pressure (/hr): 0 Overall Minimal O2 (%): 86.0 Supine % at Optimal Pressure (%): 100 Minimal O2 at Optimal Pressure (%): 86.0   SLEEP ARCHITECTURE The study was initiated at 10:05:01 PM and ended at 4:40:15 AM.  Sleep onset time was 60.4 minutes and the sleep efficiency was 45.2%. The total sleep time was 178.8 minutes.  The patient spent 11.2% of the night in stage N1 sleep, 77.9% in stage N2 sleep, 0.3% in stage N3 and 10.6% in REM.Stage REM latency was 256.5 minutes  Wake after  sleep onset was 156.0. Alpha intrusion was absent. Supine sleep was 29.55%.  CARDIAC DATA The 2 lead EKG demonstrated sinus rhythm. The mean heart rate was 63.3 beats per minute. Other EKG findings include: None.   LEG MOVEMENT DATA The total Periodic Limb Movements of Sleep (PLMS) were 0. The PLMS index was 0.0. A PLMS index of <15 is considered normal in adults.  IMPRESSIONS - The optimal PAP pressure was 13 cm of water. Titration was mainly performed for snoring rather than events. - Moderate oxygen desaturations were observed during this titration (min O2 = 86.0%). - No snoring was audible during this study. - No cardiac abnormalities were observed during this study. - Clinically significant periodic limb movements were not noted during this study. Arousals associated with PLMs were significant.   DIAGNOSIS - Obstructive Sleep Apnea (G47.33)   RECOMMENDATIONS - Trial of CPAP therapy on 13 cm H2O with a Medium size Resmed Full Face AirFit F10 mask and heated humidification.Alternatively, autoCPAP 8-13cm can be used if pressure tolerance is an issue. - Avoid alcohol, sedatives and other CNS depressants that may worsen sleep apnea and disrupt normal sleep architecture. - Sleep hygiene should be reviewed to assess factors that may improve sleep quality. - Weight management and regular exercise should be initiated or continued. - Return to [facility] for re-evaluation after 4 weeks of therapy  [Electronically signed] 09/25/2022 02:32 PM  Cyril Mourning MD, ABSM Diplomate, American Board of Sleep Medicine NPI: [id number]

## 2022-09-26 ENCOUNTER — Encounter: Payer: Self-pay | Admitting: Internal Medicine

## 2022-09-26 DIAGNOSIS — G4733 Obstructive sleep apnea (adult) (pediatric): Secondary | ICD-10-CM

## 2022-09-26 NOTE — Telephone Encounter (Signed)
CPAP Titration study was done on 09/24/2022. Patient asking for results.

## 2022-09-27 ENCOUNTER — Encounter: Payer: Self-pay | Admitting: Internal Medicine

## 2022-09-27 ENCOUNTER — Telehealth: Payer: Self-pay | Admitting: *Deleted

## 2022-09-27 ENCOUNTER — Ambulatory Visit (INDEPENDENT_AMBULATORY_CARE_PROVIDER_SITE_OTHER): Payer: 59 | Admitting: Internal Medicine

## 2022-09-27 VITALS — BP 98/50 | HR 76 | Temp 98.0°F | Ht 65.0 in | Wt 210.0 lb

## 2022-09-27 DIAGNOSIS — G4733 Obstructive sleep apnea (adult) (pediatric): Secondary | ICD-10-CM | POA: Insufficient documentation

## 2022-09-27 DIAGNOSIS — J449 Chronic obstructive pulmonary disease, unspecified: Secondary | ICD-10-CM | POA: Diagnosis not present

## 2022-09-27 NOTE — Telephone Encounter (Signed)
Called and spoke with patient she said that she has made an appt to come in to speak with the doctor regarding this , pt said that she already has a Cpap machine and that she would like the order cancel for a new machine if has been order. She said that she will discuss this further at her appt .

## 2022-09-27 NOTE — Addendum Note (Signed)
Addended by: Delrae Rend on: 09/27/2022 04:25 PM   Modules accepted: Orders

## 2022-09-27 NOTE — Progress Notes (Addendum)
09/27/22- 62 yoF former smoker (60 pkyrs) with OSA, COPD, Valvular Heart Disease/ Mitral and Aortic replacement, CHF, ASCVD/ CABG, HTN, PAFib, PHTN, DM2/ CKD3, Hearing Loss, Lumbar DDD, Anxiety,  HST WatchPat 04/20/22-AHI 25.5/ hr, desat to 76%, body weight 210 lbs CPAP titration 09/24/22- auto 4-12 CPAP 13/ [facility] AirSense 11 AutoSet PFT 2021- Nl Spiro, min resp to BD, [employer], NL DLCO O2 2L sleep/ [facility] ordered 05/15/22- she doesn't have home O2 Download compliance- Body weight today- 210 lbs -----Having problems with CPAP.  Mask is filling up with moisture and mask is popping off Mask fits poorly, uncomfortable. Humidifier over active- mask and face wet. She feels CPAP set too high- blows mask off. Explained humidifier adjustment for comfort.  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 Authorizing Provider  Accu-Chek Softclix Lancets lancets Use as instructed 05/23/22  Yes Nche, [name], NP  albuterol (VENTOLIN HFA) 108 (90 Base) MCG/ACT inhaler Inhale 2 puffs into the lungs every 6 (six) hours as needed for wheezing or shortness of breath.   Yes [provider]  amiodarone (PACERONE) 200 MG tablet Take by mouth. 08/05/22  Yes [provider]  aspirin EC 81 MG tablet Take 81 mg by mouth daily.    Yes [provider]  azelastine (ASTELIN) 0.1 % nasal spray Place 1 spray into both nostrils 2 (two) times daily. Use in each nostril as directed Patient taking differently: Place  1 spray into both nostrils daily as needed for allergies. Use in each nostril as directed 05/27/22  Yes Charlott Holler, MD  Blood Glucose Monitoring Suppl (ACCU-CHEK GUIDE) w/Device KIT Check glucose every morning 05/21/22  Yes Nche, [name], NP  clonazePAM (KLONOPIN) 0.5 MG tablet Take 1.5 tablets (0.75 mg total) by mouth at bedtime. 10/10/22  Yes Nche, [name], NP  cyclobenzaprine (FLEXERIL) 10 MG tablet Take 1 tablet (10 mg total) by mouth at bedtime. Patient taking differently: Take 10 mg by mouth at bedtime as needed for muscle spasms. 05/20/22  Yes Nche, [name], NP  diclofenac Sodium (VOLTAREN ARTHRITIS PAIN) 1 % GEL Apply 4 g topically 4 (four) times daily. Patient taking differently: Apply 4 g topically 4 (four) times daily as needed (pain). 08/23/22  Yes [name], MD  diltiazem (CARDIZEM CD) 180 MG 24 hr capsule TAKE 1 CAPSULE BY MOUTH DAILY 02/25/22  Yes [name], Manish J, MD  empagliflozin (JARDIANCE) 10 MG TABS tablet TAKE 1 TABLET(10 MG) BY MOUTH DAILY BEFORE BREAKFAST 08/12/22  Yes [name], Manish J, MD  Ferrous Sulfate (IRON PO) Take 1 tablet by mouth daily. Blood builder   Yes [provider]  furosemide (LASIX) 20 MG tablet TAKE 2 TABLETS(40 MG) BY MOUTH TWICE DAILY 09/03/22  Yes [name], Manish J, MD  glucose blood (ACCU-CHEK GUIDE) test strip Use as instructed 05/21/22  Yes Nche, [name], NP  HYDROcodone-acetaminophen (NORCO) 7.5-325 MG tablet Take 1 tablet by mouth every 12 (twelve) hours as needed for moderate pain. Patient taking differently:  Take 1 tablet by mouth every 6 (six) hours as needed for moderate pain. 08/23/22  Yes [name], MD  ipratropium (ATROVENT) 0.03 % nasal spray Place 1 spray into both nostrils 2 (two) times daily as needed for rhinitis. 07/31/22  Yes Nche, [name], NP  isosorbide mononitrate (IMDUR) 60 MG 24 hr tablet Take 1 tablet (60 mg total) by mouth daily. 09/13/22  Yes [name], [name], MD   levalbuterol (XOPENEX HFA) 45 MCG/ACT inhaler Inhale 1-2 puffs into the lungs every 6 (six) hours as needed for shortness of breath. 10/29/21  Yes Charlott Holler, MD  levalbuterol Pauline Aus) 0.63 MG/3ML nebulizer solution Take 3 mLs (0.63 mg total) by nebulization every 4 (four) hours as needed for wheezing or shortness of breath. 10/29/21  Yes Charlott Holler, MD  losartan (COZAAR) 50 MG tablet TAKE 1 TABLET(50 MG) BY MOUTH DAILY 05/27/22  Yes Nche, [name], NP  metFORMIN (GLUCOPHAGE) 500 MG tablet TAKE 1 TABLET(500 MG) BY MOUTH TWICE DAILY WITH A MEAL 05/27/22  Yes Nche, [name], NP  methocarbamol (ROBAXIN) 500 MG tablet Take 1 tablet (500 mg total) by mouth every 8 (eight) hours as needed for muscle spasms. 02/08/22  Yes Mliss Sax, MD  metoprolol succinate (TOPROL-XL) 25 MG 24 hr tablet TAKE 1 TABLET(25 MG) BY MOUTH DAILY WITH OR IMMEDIATELY FOLLOWING A MEAL 08/05/22  Yes [name], Manish J, MD  montelukast (SINGULAIR) 10 MG tablet Take 1 tablet (10 mg total) by mouth daily. 05/27/22 05/27/23 Yes Charlott Holler, MD  nitrofurantoin, macrocrystal-monohydrate, (MACROBID) 100 MG capsule Take 100 mg by mouth 2 (two) times daily. 08/11/22  Yes [provider]  nitroGLYCERIN (NITROSTAT) 0.4 MG SL tablet DISSOLVE 1 TABLET UNDER THE TONGUE EVERY 5 MINUTES FOR 3 DOSES AS NEEDED FOR CHEST PAIN 05/27/22  Yes [name], Manish J, MD  pantoprazole (PROTONIX) 40 MG tablet Take 1 tablet (40 mg total) by mouth 2 (two) times daily before a meal. Patient taking differently: Take 40 mg by mouth daily. 11/23/21  Yes Charlott Holler, MD  phenazopyridine (PYRIDIUM) 100 MG tablet Take 100 mg by mouth 3 (three) times daily. 08/11/22  Yes [provider]  potassium chloride SA (KLOR-CON M) 20 MEQ tablet Take 1 tablet (20 mEq total) by mouth 2 (two) times daily. Patient taking differently: Take 40 mEq by mouth daily. 07/12/22  Yes [name], Manish J, MD  rosuvastatin (CRESTOR) 40 MG tablet  Take 1 tablet (40 mg total) by mouth daily. 08/29/22  Yes [name], Manish J, MD  spironolactone (ALDACTONE) 50 MG tablet Take 1 tablet (50 mg total) by mouth daily. 09/03/22  Yes [name], Manish J, MD  tiotropium (SPIRIVA HANDIHALER) 18 MCG inhalation capsule Place 18 mcg into inhaler and inhale daily.   Yes [provider]  Tiotropium Bromide-Olodaterol (STIOLTO RESPIMAT) 2.5-2.5 MCG/ACT AERS Inhale 2 puffs into the lungs daily. 09/03/22  Yes Charlott Holler, MD  warfarin (COUMADIN) 5 MG tablet Take 7.5 mg on Monday and Wed and 5 mg all other days Patient taking differently: Take 5 mg by mouth See admin instructions. Take Thursday, Friday, Saturday and Sunday at bedtime 08/20/22  Yes Yates Decamp, MD  warfarin (COUMADIN) 7.5 MG tablet Take 7.5 mg on Monday and Wednesday and 5 mg all other days Patient taking differently: Take 7.5 mg by mouth See admin instructions. Take 7.5 mg on Monday, Tuesday and Wednesday at bedtime 08/20/22  Yes Yates Decamp, MD   ROS-see HPI   + = positive Constitutional:  weight loss, night sweats, fevers, chills, fatigue, lassitude. HEENT:    headaches, difficulty swallowing, tooth/dental problems, sore throat,       sneezing, itching, ear ache, nasal congestion, post nasal drip, snoring CV:    chest pain, orthopnea, PND, swelling in lower extremities, anasarca,                 dizziness, palpitations Resp:   +shortness of breath with exertion or at rest.                productive cough,   non-productive cough, coughing up of blood.              change in color of mucus.  wheezing.   Skin:    rash or lesions. GI:  No-   heartburn, indigestion, abdominal pain, nausea, vomiting, diarrhea,                 change in bowel habits, loss of appetite GU: dysuria, change in color of urine, no urgency or frequency.   flank pain. MS:   joint pain, stiffness, decreased range of motion, back pain. Neuro-     nothing unusual Psych:  change in mood or affect.  depression or  anxiety.   memory loss.  OBJ- Physical Exam General- Alert, Oriented, Affect-appropriate, Distress- none acute, +obese Skin- rash-none, lesions- none, excoriation- none Lymphadenopathy- none Head- atraumatic            Eyes- Gross vision intact, PERRLA, conjunctivae and secretions clear            Ears- Hearing, canals-normal            Nose- Clear, no-Septal dev, mucus, polyps, erosion, perforation             Throat- Mallampati III-IV , mucosa clear , drainage- none, tonsils- atrophic, +teeth Neck- flexible , trachea midline, no stridor , thyroid nl, carotid no bruit Chest - symmetrical excursion , unlabored           Heart/CV- RRR ,+ murmur 2+S , Crisp valve click, no gallop  , no rub, nl s1 s2                           - JVD- none , edema- none, stasis changes- none, varices- none           Lung- clear to P&A, wheeze- none, cough- none , dullness-none, rub- none           Chest wall-  Abd-  Br/ Gen/ Rectal- Not done, not indicated Extrem- cyanosis- none, clubbing, none, atrophy- none, strength- nl Neuro- grossly intact to observation

## 2022-09-27 NOTE — Patient Instructions (Signed)
Order- [facility]-please change CPAP to autopap 4-10, mask of choice, humidifier, supplies, please install AirView/ card

## 2022-09-27 NOTE — Assessment & Plan Note (Signed)
Feels fairly well controlled without recent exacerbation. We can help with this problem as needed.

## 2022-09-27 NOTE — Assessment & Plan Note (Signed)
Should benefit from CPAP. Detailed discussin done, questions answered. Plan- [facility] to reduce autopap range for now to auto 4-10

## 2022-09-27 NOTE — Telephone Encounter (Signed)
Dr. Celine Mans, this patient was seen in the office by Dr. Maple Hudson with CPAP mask issues.  She would like to transfer her care to Dr. Maple Hudson.  Dr. Maple Hudson is agreeable.  Please advise if ok with you.  Thank you.

## 2022-09-30 ENCOUNTER — Other Ambulatory Visit: Payer: 59

## 2022-09-30 DIAGNOSIS — G4733 Obstructive sleep apnea (adult) (pediatric): Secondary | ICD-10-CM | POA: Diagnosis not present

## 2022-10-01 ENCOUNTER — Ambulatory Visit (INDEPENDENT_AMBULATORY_CARE_PROVIDER_SITE_OTHER): Payer: 59 | Admitting: Nurse Practitioner

## 2022-10-01 ENCOUNTER — Encounter: Payer: Self-pay | Admitting: Nurse Practitioner

## 2022-10-01 VITALS — BP 102/61 | HR 85 | Temp 98.0°F | Ht 65.0 in | Wt 210.0 lb

## 2022-10-01 DIAGNOSIS — I10 Essential (primary) hypertension: Secondary | ICD-10-CM | POA: Diagnosis not present

## 2022-10-01 DIAGNOSIS — Z136 Encounter for screening for cardiovascular disorders: Secondary | ICD-10-CM

## 2022-10-01 DIAGNOSIS — G2581 Restless legs syndrome: Secondary | ICD-10-CM | POA: Diagnosis not present

## 2022-10-01 DIAGNOSIS — F411 Generalized anxiety disorder: Secondary | ICD-10-CM

## 2022-10-01 DIAGNOSIS — E1169 Type 2 diabetes mellitus with other specified complication: Secondary | ICD-10-CM | POA: Diagnosis not present

## 2022-10-01 DIAGNOSIS — Z13220 Encounter for screening for lipoid disorders: Secondary | ICD-10-CM | POA: Diagnosis not present

## 2022-10-01 DIAGNOSIS — E1122 Type 2 diabetes mellitus with diabetic chronic kidney disease: Secondary | ICD-10-CM | POA: Diagnosis not present

## 2022-10-01 DIAGNOSIS — N183 Chronic kidney disease, stage 3 unspecified: Secondary | ICD-10-CM | POA: Diagnosis not present

## 2022-10-01 DIAGNOSIS — K5903 Drug induced constipation: Secondary | ICD-10-CM

## 2022-10-01 DIAGNOSIS — D500 Iron deficiency anemia secondary to blood loss (chronic): Secondary | ICD-10-CM | POA: Diagnosis not present

## 2022-10-01 LAB — BASIC METABOLIC PANEL
BUN: 22 mg/dL (ref 6–23)
CO2: 24 mEq/L (ref 19–32)
Calcium: 9.7 mg/dL (ref 8.4–10.5)
Chloride: 100 mEq/L (ref 96–112)
Creatinine, Ser: 1.48 mg/dL — ABNORMAL HIGH (ref 0.40–1.20)
GFR: 38.09 mL/min — ABNORMAL LOW (ref >60.00)
Glucose, Bld: 84 mg/dL (ref 70–99)
Potassium: 4.5 mEq/L (ref 3.5–5.1)
Sodium: 137 mEq/L (ref 135–145)

## 2022-10-01 LAB — HEPATIC FUNCTION PANEL
ALT: 23 U/L (ref 0–35)
AST: 25 U/L (ref 0–37)
Albumin: 4.7 g/dL (ref 3.5–5.2)
Alkaline Phosphatase: 112 U/L (ref 39–117)
Bilirubin, Direct: 0.1 mg/dL (ref 0.0–0.3)
Total Bilirubin: 0.5 mg/dL (ref 0.2–1.2)
Total Protein: 7.3 g/dL (ref 6.0–8.3)

## 2022-10-01 LAB — LIPID PANEL
Cholesterol: 144 mg/dL (ref 0–200)
HDL: 50.6 mg/dL (ref >39.00)
LDL Cholesterol: 59 mg/dL (ref 0–99)
NonHDL: 93.08
Total CHOL/HDL Ratio: 3
Triglycerides: 170 mg/dL — ABNORMAL HIGH (ref 0.0–149.0)
VLDL: 34 mg/dL (ref 0.0–40.0)

## 2022-10-01 MED ORDER — GABAPENTIN 100 MG PO CAPS: 100.0000 mg | ORAL_CAPSULE | Freq: Every day | ORAL | 5 refills | Status: DC

## 2022-10-01 NOTE — Patient Instructions (Addendum)
Use Colace 100mg  BID or Miralax 17g daily for constipation. F/up with GI Go to lab Start gabapentin 100mg  at bedtime for restless leg

## 2022-10-01 NOTE — Progress Notes (Signed)
Established Patient Visit  Patient: Kimberly Hobbs   DOB: October 20, 1960   62 y.o. Female  MRN: [id number] Visit Date: 10/01/2022  Subjective:    Chief Complaint  Patient presents with   Anxiety    Talk about  clonazePAM 0.5 MG tablet    Pain    Pt c/o bilateral leg pain    HPI GAD (generalized anxiety disorder) Reports racing thoughts at bedtime which interferes with sleep. Ongoing sessions with psychologist: monthly Unable to tolerate trazodone: daytime drowsiness. She wants to increase klonopin dose and declined to resume celexa 20mg  Current use of hydrocodone for pain management. Hx of OBSTRUCTIVE SLEEP APNEA with use of CPAP machine  With increased risk of sedation, I do not recommend increased klonopin dose. I recommended evaluation by psychiatry for medication management and/or adjustment. She declined referral. Advised to maintain current klonopin dose.  Restless leg Onset 2months ago, describes as intermittent tightness in bilateral upper legs, occurs only at bedtime. 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 Hx of DIABETES, and iron deficient anemia.  Possibly due to neuropathy vs radiculopathy vs anemia DRUG-DRUG INTERACTION between warfarin and requip and elavil. Sent gabapentin 100mg  at hs F/up in 1-16months  Iron deficiency anemia Reports constipation with iron supplement and pain med. Unable to collect fecal occult sample  Repeat iron panel Advised to use colace 100mg  BID or miralax 17g daily for constipation Entered GI referral  Reviewed medical, surgical, and social history today  Medications: Outpatient Medications Prior to Visit  Medication Sig   Accu-Chek Softclix Lancets lancets Use as instructed   albuterol (VENTOLIN HFA) 108 (90 Base) MCG/ACT inhaler Inhale 2 puffs into the lungs every 6 (six) hours as needed for wheezing or shortness of breath.   amiodarone (PACERONE) 200 MG tablet Take by mouth.   aspirin EC 81  MG tablet Take 81 mg by mouth daily.    azelastine (ASTELIN) 0.1 % nasal spray Place 1 spray into both nostrils 2 (two) times daily. Use in each nostril as directed (Patient taking differently: Place 1 spray into both nostrils daily as needed for allergies. Use in each nostril as directed)   Blood Glucose Monitoring Suppl (ACCU-CHEK GUIDE) w/Device KIT Check glucose every morning   [START ON 10/10/2022] clonazePAM (KLONOPIN) 0.5 MG tablet Take 1.5 tablets (0.75 mg total) by mouth at bedtime.   diclofenac Sodium (VOLTAREN ARTHRITIS PAIN) 1 % GEL Apply 4 g topically 4 (four) times daily. (Patient taking differently: Apply 4 g topically 4 (four) times daily as needed (pain).)   diltiazem (CARDIZEM CD) 180 MG 24 hr capsule TAKE 1 CAPSULE BY MOUTH DAILY   empagliflozin (JARDIANCE) 10 MG TABS tablet TAKE 1 TABLET(10 MG) BY MOUTH DAILY BEFORE BREAKFAST   Ferrous Sulfate (IRON PO) Take 1 tablet by mouth daily. Blood builder   furosemide (LASIX) 20 MG tablet TAKE 2 TABLETS(40 MG) BY MOUTH TWICE DAILY   glucose blood (ACCU-CHEK GUIDE) test strip Use as instructed   HYDROcodone-acetaminophen (NORCO) 7.5-325 MG tablet Take 1 tablet by mouth every 12 (twelve) hours as needed for moderate pain. (Patient taking differently: Take 1 tablet by mouth every 6 (six) hours as needed for moderate pain.)   ipratropium (ATROVENT) 0.03 % nasal spray Place 1 spray into both nostrils 2 (two) times daily as needed for rhinitis.   isosorbide mononitrate (IMDUR) 60 MG 24 hr tablet Take 1 tablet (60 mg total) by  mouth daily.   levalbuterol (XOPENEX HFA) 45 MCG/ACT inhaler Inhale 1-2 puffs into the lungs every 6 (six) hours as needed for shortness of breath.   levalbuterol (XOPENEX) 0.63 MG/3ML nebulizer solution Take 3 mLs (0.63 mg total) by nebulization every 4 (four) hours as needed for wheezing or shortness of breath.   losartan (COZAAR) 50 MG tablet TAKE 1 TABLET(50 MG) BY MOUTH DAILY   metFORMIN (GLUCOPHAGE) 500 MG tablet TAKE  1 TABLET(500 MG) BY MOUTH TWICE DAILY WITH A MEAL   methocarbamol (ROBAXIN) 500 MG tablet Take 1 tablet (500 mg total) by mouth every 8 (eight) hours as needed for muscle spasms.   metoprolol succinate (TOPROL-XL) 25 MG 24 hr tablet TAKE 1 TABLET(25 MG) BY MOUTH DAILY WITH OR IMMEDIATELY FOLLOWING A MEAL   montelukast (SINGULAIR) 10 MG tablet Take 1 tablet (10 mg total) by mouth daily.   nitrofurantoin, macrocrystal-monohydrate, (MACROBID) 100 MG capsule Take 100 mg by mouth 2 (two) times daily.   nitroGLYCERIN (NITROSTAT) 0.4 MG SL tablet DISSOLVE 1 TABLET UNDER THE TONGUE EVERY 5 MINUTES FOR 3 DOSES AS NEEDED FOR CHEST PAIN   pantoprazole (PROTONIX) 40 MG tablet Take 1 tablet (40 mg total) by mouth 2 (two) times daily before a meal. (Patient taking differently: Take 40 mg by mouth daily.)   phenazopyridine (PYRIDIUM) 100 MG tablet Take 100 mg by mouth 3 (three) times daily.   potassium chloride SA (KLOR-CON M) 20 MEQ tablet Take 1 tablet (20 mEq total) by mouth 2 (two) times daily. (Patient taking differently: Take 40 mEq by mouth daily.)   rosuvastatin (CRESTOR) 40 MG tablet Take 1 tablet (40 mg total) by mouth daily.   spironolactone (ALDACTONE) 50 MG tablet Take 1 tablet (50 mg total) by mouth daily.   tiotropium (SPIRIVA HANDIHALER) 18 MCG inhalation capsule Place 18 mcg into inhaler and inhale daily.   Tiotropium Bromide-Olodaterol (STIOLTO RESPIMAT) 2.5-2.5 MCG/ACT AERS Inhale 2 puffs into the lungs daily.   warfarin (COUMADIN) 5 MG tablet Take 7.5 mg on Monday and Wed and 5 mg all other days (Patient taking differently: Take 5 mg by mouth See admin instructions. Take Thursday, Friday, Saturday and Sunday at bedtime)   warfarin (COUMADIN) 7.5 MG tablet Take 7.5 mg on Monday and Wednesday and 5 mg all other days (Patient taking differently: Take 7.5 mg by mouth See admin instructions. Take 7.5 mg on Monday, Tuesday and Wednesday at bedtime)   [DISCONTINUED] cyclobenzaprine (FLEXERIL) 10 MG  tablet Take 1 tablet (10 mg total) by mouth at bedtime. (Patient taking differently: Take 10 mg by mouth at bedtime as needed for muscle spasms.)   No facility-administered medications prior to visit.   Reviewed past medical and social history.   ROS per HPI above      Objective:  BP 102/61 (BP Location: Left Arm, Patient Position: Sitting, Cuff Size: Large)   Pulse 85   Temp 98 F (36.7 C) (Temporal)   Ht 5\' 5"  (1.651 m)   Wt 210 lb (95.3 kg)   LMP 06/20/2015   SpO2 97%   BMI 34.95 kg/m      Physical Exam  No results found for any visits on 10/01/22.    Assessment & Plan:    Problem List Items Addressed This Visit       Cardiovascular and Mediastinum   Essential hypertension     Endocrine   CKD stage 3 due to type 2 diabetes mellitus (HCC)   DM (diabetes mellitus) (HCC)  Other   GAD (generalized anxiety disorder) - Primary    Reports racing thoughts at bedtime which interferes with sleep. Ongoing sessions with psychologist: monthly Unable to tolerate trazodone: daytime drowsiness. She wants to increase klonopin dose and declined to resume celexa 20mg  Current use of hydrocodone for pain management. Hx of OBSTRUCTIVE SLEEP APNEA with use of CPAP machine  With increased risk of sedation, I do not recommend increased klonopin dose. I recommended evaluation by psychiatry for medication management and/or adjustment. She declined referral. Advised to maintain current klonopin dose.      Iron deficiency anemia    Reports constipation with iron supplement and pain med. Unable to collect fecal occult sample  Repeat iron panel Advised to use colace 100mg  BID or miralax 17g daily for constipation Entered GI referral      Relevant Orders   Ambulatory referral to Gastroenterology   Restless leg    Onset 2months ago, describes as intermittent tightness in bilateral upper legs, occurs only at bedtime. 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 Hx of  DIABETES, and iron deficient anemia.  Possibly due to neuropathy vs radiculopathy vs anemia DRUG-DRUG INTERACTION between warfarin and requip and elavil. Sent gabapentin 100mg  at hs F/up in 1-62months      Relevant Medications   gabapentin (NEURONTIN) 100 MG capsule   Other Visit Diagnoses     Drug-induced constipation       Relevant Orders   Ambulatory referral to Gastroenterology   Encounter for lipid screening for cardiovascular disease          Return for maintain upcoming appt.     [name], NP

## 2022-10-01 NOTE — Assessment & Plan Note (Signed)
Reports constipation with iron supplement and pain med. Unable to collect fecal occult sample  Repeat iron panel Advised to use colace 100mg  BID or miralax 17g daily for constipation Entered GI referral

## 2022-10-01 NOTE — Assessment & Plan Note (Addendum)
Onset 2months ago, describes as intermittent tightness in bilateral upper legs, occurs only at bedtime. 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 Hx of DIABETES, and iron deficient anemia.  Possibly due to neuropathy vs radiculopathy vs anemia DRUG-DRUG INTERACTION between warfarin and requip and elavil. Sent gabapentin 100mg  at hs F/up in 1-30months

## 2022-10-01 NOTE — Assessment & Plan Note (Addendum)
Reports racing thoughts at bedtime which interferes with sleep. Ongoing sessions with psychologist: monthly Unable to tolerate trazodone: daytime drowsiness. She wants to increase klonopin dose and declined to resume celexa 20mg  Current use of hydrocodone for pain management. Hx of OBSTRUCTIVE SLEEP APNEA with use of CPAP machine  With increased risk of sedation, I do not recommend increased klonopin dose. I recommended evaluation by psychiatry for medication management and/or adjustment. She declined referral. Advised to maintain current klonopin dose.

## 2022-10-02 NOTE — Telephone Encounter (Signed)
No action done

## 2022-10-03 ENCOUNTER — Other Ambulatory Visit (INDEPENDENT_AMBULATORY_CARE_PROVIDER_SITE_OTHER): Payer: 59

## 2022-10-03 ENCOUNTER — Ambulatory Visit (HOSPITAL_COMMUNITY)
Admission: RE | Admit: 2022-10-03 | Discharge: 2022-10-03 | Disposition: A | Discharge status: Home / Self Care | Payer: 59 | Source: Ambulatory Visit | Attending: Physician Assistant | Admitting: Physician Assistant

## 2022-10-03 ENCOUNTER — Encounter (HOSPITAL_COMMUNITY): Payer: Self-pay | Admitting: Physician Assistant

## 2022-10-03 VITALS — BP 110/64 | HR 69 | Ht 65.0 in | Wt 210.4 lb

## 2022-10-03 DIAGNOSIS — I2510 Atherosclerotic heart disease of native coronary artery without angina pectoris: Secondary | ICD-10-CM | POA: Diagnosis not present

## 2022-10-03 DIAGNOSIS — Z791 Long term (current) use of non-steroidal anti-inflammatories (NSAID): Secondary | ICD-10-CM | POA: Diagnosis not present

## 2022-10-03 DIAGNOSIS — D6869 Other thrombophilia: Secondary | ICD-10-CM | POA: Diagnosis not present

## 2022-10-03 DIAGNOSIS — E669 Obesity, unspecified: Secondary | ICD-10-CM | POA: Diagnosis not present

## 2022-10-03 DIAGNOSIS — Z5181 Encounter for therapeutic drug level monitoring: Secondary | ICD-10-CM | POA: Diagnosis not present

## 2022-10-03 DIAGNOSIS — I4819 Other persistent atrial fibrillation: Secondary | ICD-10-CM | POA: Diagnosis not present

## 2022-10-03 DIAGNOSIS — Z8616 Personal history of COVID-19: Secondary | ICD-10-CM | POA: Insufficient documentation

## 2022-10-03 DIAGNOSIS — G4733 Obstructive sleep apnea (adult) (pediatric): Secondary | ICD-10-CM | POA: Insufficient documentation

## 2022-10-03 DIAGNOSIS — N183 Chronic kidney disease, stage 3 unspecified: Secondary | ICD-10-CM | POA: Insufficient documentation

## 2022-10-03 DIAGNOSIS — Z6835 Body mass index (BMI) 35.0-35.9, adult: Secondary | ICD-10-CM | POA: Diagnosis not present

## 2022-10-03 DIAGNOSIS — D509 Iron deficiency anemia, unspecified: Secondary | ICD-10-CM | POA: Diagnosis not present

## 2022-10-03 DIAGNOSIS — E1122 Type 2 diabetes mellitus with diabetic chronic kidney disease: Secondary | ICD-10-CM | POA: Insufficient documentation

## 2022-10-03 DIAGNOSIS — Z7901 Long term (current) use of anticoagulants: Secondary | ICD-10-CM | POA: Diagnosis not present

## 2022-10-03 DIAGNOSIS — Z7984 Long term (current) use of oral hypoglycemic drugs: Secondary | ICD-10-CM | POA: Diagnosis not present

## 2022-10-03 DIAGNOSIS — Z951 Presence of aortocoronary bypass graft: Secondary | ICD-10-CM | POA: Diagnosis not present

## 2022-10-03 DIAGNOSIS — Z79899 Other long term (current) drug therapy: Secondary | ICD-10-CM | POA: Insufficient documentation

## 2022-10-03 DIAGNOSIS — I130 Hypertensive heart and chronic kidney disease with heart failure and stage 1 through stage 4 chronic kidney disease, or unspecified chronic kidney disease: Secondary | ICD-10-CM | POA: Diagnosis not present

## 2022-10-03 DIAGNOSIS — I5032 Chronic diastolic (congestive) heart failure: Secondary | ICD-10-CM | POA: Diagnosis not present

## 2022-10-03 NOTE — Progress Notes (Addendum)
Primary Care Physician: Nche, [name], NP Primary Cardiologist: Elder Negus, MD Electrophysiologist: Regan Lemming, MD  Referring Physician: Dr Zada Girt Adamari Goulette is a 62 y.o. 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 She was admitted to the hospital March 2024 with acute on chronic diastolic heart failure and rapid atrial fibrillation. She had previously failed amiodarone. She was seen by Dr Elberta Fortis and underwent afib ablation on 09/05/22. Patient is on warfarin for a CHADS2VASC score of 5.  On follow up today, patient reports that she has done well since her ablation. She denies chest pain, swallowing pain, or groin issues. She has had two episodes of afib, each lasting ~48 hours. She has also had some brief "skipped beats".   Today, she denies symptoms of chest pain, shortness of breath, orthopnea, PND, lower extremity edema, dizziness, presyncope, syncope, snoring, daytime somnolence, bleeding, or neurologic sequela. The patient is tolerating medications without difficulties and is otherwise without complaint today.    Atrial Fibrillation Risk Factors:  she does have symptoms or diagnosis of sleep apnea. she is compliant with CPAP therapy. she does have a history of rheumatic fever.   Atrial Fibrillation Management history:  Previous antiarrhythmic drugs: amiodarone Previous cardioversions: 04/02/22, 04/27/22 Previous ablations: 09/05/22 Anticoagulation history: warfarin   ROS- All systems are reviewed and negative except as per the HPI above.  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     amiodarone (PACERONE) 200 MG tablet Take by mouth.     aspirin EC 81 MG tablet Take 81 mg by mouth daily.      azelastine (ASTELIN) 0.1 % nasal spray Place 1 spray into both nostrils 2 (two) times daily. Use in each nostril as directed (Patient taking differently: Place 1 spray into both nostrils daily as needed for allergies. Use in each nostril as directed) 30 mL 12   Blood Glucose Monitoring Suppl (ACCU-CHEK GUIDE) w/Device KIT Check glucose every morning 1 kit 0   [START ON 10/10/2022] clonazePAM (KLONOPIN) 0.5 MG tablet Take 1.5 tablets (0.75 mg total) by mouth at bedtime. 45 tablet 2   diclofenac Sodium (VOLTAREN ARTHRITIS PAIN) 1 % GEL Apply 4 g topically 4 (four) times daily. (Patient taking differently: Apply 4 g topically 4 (four) times daily as needed (pain).) 150 g 5   diltiazem (CARDIZEM CD) 180 MG 24 hr capsule TAKE 1 CAPSULE BY MOUTH DAILY 90 capsule 2   empagliflozin (JARDIANCE) 10 MG TABS tablet TAKE 1 TABLET(10 MG) BY MOUTH DAILY BEFORE BREAKFAST 90 tablet 3   Ferrous Sulfate (  IRON PO) Take 1 tablet by mouth daily. Blood builder     furosemide (LASIX) 20 MG tablet TAKE 2 TABLETS(40 MG) BY MOUTH TWICE DAILY 60 tablet 3   gabapentin (NEURONTIN) 100 MG capsule Take 1 capsule (100 mg total) by mouth at bedtime. 30 capsule 5   glucose blood (ACCU-CHEK GUIDE) test strip Use as instructed 100 each 12   HYDROcodone-acetaminophen (NORCO) 7.5-325 MG tablet  Take 1 tablet by mouth every 12 (twelve) hours as needed for moderate pain. (Patient taking differently: Take 1 tablet by mouth every 6 (six) hours as needed for moderate pain.) 30 tablet 0   ipratropium (ATROVENT) 0.03 % nasal spray Place 1 spray into both nostrils 2 (two) times daily as needed for rhinitis. 30 mL 0   isosorbide mononitrate (IMDUR) 60 MG 24 hr tablet Take 1 tablet (60 mg total) by mouth daily. 90 tablet 3   levalbuterol (XOPENEX HFA) 45 MCG/ACT inhaler Inhale 1-2 puffs into the lungs every 6 (six) hours as needed for shortness of breath. 1 each 12   levalbuterol (XOPENEX) 0.63 MG/3ML nebulizer solution Take 3 mLs (0.63 mg total) by nebulization every 4 (four) hours as needed for wheezing or shortness of breath. 3 mL 12   losartan (COZAAR) 50 MG tablet TAKE 1 TABLET(50 MG) BY MOUTH DAILY 90 tablet 3   metFORMIN (GLUCOPHAGE) 500 MG tablet TAKE 1 TABLET(500 MG) BY MOUTH TWICE DAILY WITH A MEAL 180 tablet 3   methocarbamol (ROBAXIN) 500 MG tablet Take 1 tablet (500 mg total) by mouth every 8 (eight) hours as needed for muscle spasms. 40 tablet 0   metoprolol succinate (TOPROL-XL) 25 MG 24 hr tablet TAKE 1 TABLET(25 MG) BY MOUTH DAILY WITH OR IMMEDIATELY FOLLOWING A MEAL 90 tablet 3   montelukast (SINGULAIR) 10 MG tablet Take 1 tablet (10 mg total) by mouth daily. 30 tablet 11   nitroGLYCERIN (NITROSTAT) 0.4 MG SL tablet DISSOLVE 1 TABLET UNDER THE TONGUE EVERY 5 MINUTES FOR 3 DOSES AS NEEDED FOR CHEST PAIN 25 tablet 3   pantoprazole (PROTONIX) 40 MG tablet Take 1 tablet (40 mg total) by mouth 2 (two) times daily before a meal. (Patient taking differently: Take 40 mg by mouth daily.) 180 tablet 3   phenazopyridine (PYRIDIUM) 100 MG tablet Take 100 mg by mouth 3 (three) times daily.     potassium chloride SA (KLOR-CON M) 20 MEQ tablet Take 1 tablet (20 mEq total) by mouth 2 (two) times daily. (Patient taking differently: Take 40 mEq by mouth daily.) 180 tablet 3   rosuvastatin (CRESTOR) 40  MG tablet Take 1 tablet (40 mg total) by mouth daily. 90 tablet 1   spironolactone (ALDACTONE) 50 MG tablet Take 1 tablet (50 mg total) by mouth daily. 90 tablet 3   tiotropium (SPIRIVA HANDIHALER) 18 MCG inhalation capsule Place 18 mcg into inhaler and inhale daily.     Tiotropium Bromide-Olodaterol (STIOLTO RESPIMAT) 2.5-2.5 MCG/ACT AERS Inhale 2 puffs into the lungs daily. 4 g 6   warfarin (COUMADIN) 5 MG tablet Take 7.5 mg on Monday and Wed and 5 mg all other days (Patient taking differently: Take 5 mg by mouth See admin instructions. Take Thursday, Friday, Saturday and Sunday at bedtime) 90 tablet 3   warfarin (COUMADIN) 7.5 MG tablet Take 7.5 mg on Monday and Wednesday and 5 mg all other days (Patient taking differently: Take 7.5 mg by mouth See admin instructions. Take 7.5 mg on Monday, Tuesday and Wednesday at bedtime) 90 tablet 3  No current facility-administered medications for this encounter.    Physical Exam: BP 110/64   Pulse 69   Ht 5\' 5"  (1.651 m)   Wt 95.4 kg   LMP 06/20/2015   BMI 35.01 kg/m   GEN: Well nourished, well developed in no acute distress NECK: No JVD; No carotid bruits CARDIAC: Regular rate and rhythm, no murmurs, rubs, gallops RESPIRATORY:  Clear to auscultation without rales, wheezing or rhonchi  ABDOMEN: Soft, non-tender, non-distended EXTREMITIES:  No edema; No deformity   Wt Readings from Last 3 Encounters:  10/03/22 95.4 kg  10/01/22 95.3 kg  09/27/22 95.3 kg     EKG today demonstrates  SR, LBBB Vent. rate 69 BPM PR interval 182 ms QRS duration 120 ms QT/QTcB 418/447 ms  Echo 04/05/22 demonstrated  Mildly depressed LV systolic function with EF 53%. Left ventricle cavity  is normal in size. Normal global wall motion. Calculated EF 53%.  Mechanical trileaflet aortic valve.  No aortic valve regurgitation. AVA  (VTI) measures 1.4 cm^2. AV Mean Grad measures 20.6 mmHg. AV Pk Vel  measures 3.39 m/s.  Mechanical mitral valve.  No mitral valve  regurgitation. E-wave dominant  mitral inflow.  Structurally normal tricuspid valve.  Mild tricuspid regurgitation. RVSP  measures 35 mmHg.  IVC is dilated with respiratory variation.  Normally functioning prosthetic valves.    CHA2DS2-VASc Score = 5  The patient's score is based upon: CHF History: 1 HTN History: 1 Diabetes History: 1 Stroke History: 0 Vascular Disease History: 1 Age Score: 0 Gender Score: 1       ASSESSMENT AND PLAN: Persistent Atrial Fibrillation (ICD10:  I48.19) The patient's CHA2DS2-VASc score is 5, indicating a 7.2% annual risk of stroke.   S/p afib ablation 09/05/22 Reassured patient that some breakthrough episodes are not uncommon post ablation. Encouraging that she converted on her own without DCCV.  Continue amiodarone 200 mg daily for now, anticipate this will be short term post ablation.  Continue Toprol 25 mg daily Continue warfarin  Secondary Hypercoagulable State (ICD10:  D68.69) The patient is at significant risk for stroke/thromboembolism based upon her CHA2DS2-VASc Score of 5.  Continue Warfarin (Coumadin).   CAD S/p CABG 2020 No anginal symptoms  HTN Stable on current regimen  Valvular heart disease Rheumatic S/p mitral and aortic valve replacements 2018 Continue warfarin  Obesity Body mass index is 35.01 kg/m.  Encouraged lifestyle modification  Chronic HFpEF GDMT per primary cardiology team Fluid status appears stable today  OSA  Encouraged nightly CPAP    Follow up with Dr Elberta Fortis as scheduled.    Addendum 10/25/22: Patient has not been taking amiodarone since 04/2022. Will remove from her medication list.    Jorja Loa PA-C [facility] [address] [place], [place] [address] [phone]

## 2022-10-04 LAB — FECAL OCCULT BLOOD, IMMUNOCHEMICAL: Fecal Occult Bld: NEGATIVE

## 2022-10-08 ENCOUNTER — Other Ambulatory Visit: Payer: Self-pay | Admitting: Physical Medicine & Rehabilitation

## 2022-10-08 ENCOUNTER — Encounter: Payer: Self-pay | Admitting: Physical Medicine & Rehabilitation

## 2022-10-08 ENCOUNTER — Other Ambulatory Visit: Payer: Self-pay | Admitting: Nurse Practitioner

## 2022-10-08 DIAGNOSIS — F411 Generalized anxiety disorder: Secondary | ICD-10-CM

## 2022-10-09 ENCOUNTER — Encounter: Payer: Self-pay | Admitting: Nurse Practitioner

## 2022-10-09 ENCOUNTER — Other Ambulatory Visit: Payer: Self-pay | Admitting: Nurse Practitioner

## 2022-10-09 DIAGNOSIS — F411 Generalized anxiety disorder: Secondary | ICD-10-CM

## 2022-10-09 MED ORDER — HYDROCODONE-ACETAMINOPHEN 7.5-325 MG PO TABS: 1.0000 | ORAL_TABLET | Freq: Two times a day (BID) | ORAL | 0 refills | Status: DC | PRN

## 2022-10-10 ENCOUNTER — Other Ambulatory Visit (HOSPITAL_BASED_OUTPATIENT_CLINIC_OR_DEPARTMENT_OTHER): Payer: Self-pay

## 2022-10-10 ENCOUNTER — Encounter: Payer: Self-pay | Admitting: Internal Medicine

## 2022-10-10 DIAGNOSIS — G4733 Obstructive sleep apnea (adult) (pediatric): Secondary | ICD-10-CM

## 2022-10-14 ENCOUNTER — Ambulatory Visit (INDEPENDENT_AMBULATORY_CARE_PROVIDER_SITE_OTHER): Payer: 59

## 2022-10-14 DIAGNOSIS — Z0000 Encounter for general adult medical examination without abnormal findings: Secondary | ICD-10-CM

## 2022-10-14 NOTE — Progress Notes (Signed)
Subjective:   Kimberly Hobbs is a 62 y.o. female who presents for Medicare Annual (Subsequent) preventive examination.  Visit Complete: Virtual  I connected with  Kimberly Hobbs on 10/14/22 by a audio enabled telemedicine application and verified that I am speaking with the correct person using two identifiers.  Patient Location: Home  Provider Location: Office/Clinic  I discussed the limitations of evaluation and management by telemedicine. The patient expressed understanding and agreed to proceed.  Vital Signs: Unable to obtain new vitals due to this being a telehealth visit.  Review of Systems     Cardiac Risk Factors include: advanced age (>84men, >42 women);diabetes mellitus;hypertension     Objective:    Today's Vitals   There is no height or weight on file to calculate BMI.     10/14/2022   10:48 AM 09/24/2022    8:47 PM 09/05/2022    6:00 AM 04/25/2022    7:54 PM 04/02/2022    6:52 AM 10/10/2021   11:01 AM 03/15/2019   11:05 AM  Advanced Directives  Does Patient Have a Medical Advance Directive? No No No No No No No  Does patient want to make changes to medical advance directive?  No - Patient declined Yes (MAU/Ambulatory/Procedural Areas - Information given)      Would patient like information on creating a medical advance directive?  No - Patient declined  No - Patient declined No - Patient declined No - Patient declined No - Patient declined    Current Medications (verified) Outpatient Encounter Medications as of 10/14/2022  Medication Sig   albuterol (VENTOLIN HFA) 108 (90 Base) MCG/ACT inhaler Inhale 2 puffs into the lungs every 6 (six) hours as needed for wheezing or shortness of breath.   amiodarone (PACERONE) 200 MG tablet Take by mouth.   aspirin EC 81 MG tablet Take 81 mg by mouth daily.    azelastine (ASTELIN) 0.1 % nasal spray Place 1 spray into both nostrils 2 (two) times daily. Use in each nostril as directed (Patient taking differently: Place 1  spray into both nostrils daily as needed for allergies. Use in each nostril as directed)   Blood Glucose Monitoring Suppl (ACCU-CHEK GUIDE) w/Device KIT Check glucose every morning   clonazePAM (KLONOPIN) 0.5 MG tablet Take 1.5 tablets (0.75 mg total) by mouth at bedtime.   diclofenac Sodium (VOLTAREN ARTHRITIS PAIN) 1 % GEL Apply 4 g topically 4 (four) times daily. (Patient taking differently: Apply 4 g topically 4 (four) times daily as needed (pain).)   diltiazem (CARDIZEM CD) 180 MG 24 hr capsule TAKE 1 CAPSULE BY MOUTH DAILY   empagliflozin (JARDIANCE) 10 MG TABS tablet TAKE 1 TABLET(10 MG) BY MOUTH DAILY BEFORE BREAKFAST   Ferrous Sulfate (IRON PO) Take 1 tablet by mouth daily. Blood builder   furosemide (LASIX) 20 MG tablet TAKE 2 TABLETS(40 MG) BY MOUTH TWICE DAILY   gabapentin (NEURONTIN) 100 MG capsule Take 1 capsule (100 mg total) by mouth at bedtime.   glucose blood (ACCU-CHEK GUIDE) test strip Use as instructed   HYDROcodone-acetaminophen (NORCO) 7.5-325 MG tablet Take 1 tablet by mouth every 12 (twelve) hours as needed for moderate pain.   ipratropium (ATROVENT) 0.03 % nasal spray Place 1 spray into both nostrils 2 (two) times daily as needed for rhinitis.   isosorbide mononitrate (IMDUR) 60 MG 24 hr tablet Take 1 tablet (60 mg total) by mouth daily.   levalbuterol (XOPENEX HFA) 45 MCG/ACT inhaler Inhale 1-2 puffs into the lungs every 6 (six)  hours as needed for shortness of breath.   levalbuterol (XOPENEX) 0.63 MG/3ML nebulizer solution Take 3 mLs (0.63 mg total) by nebulization every 4 (four) hours as needed for wheezing or shortness of breath.   losartan (COZAAR) 50 MG tablet TAKE 1 TABLET(50 MG) BY MOUTH DAILY   metFORMIN (GLUCOPHAGE) 500 MG tablet TAKE 1 TABLET(500 MG) BY MOUTH TWICE DAILY WITH A MEAL   methocarbamol (ROBAXIN) 500 MG tablet Take 1 tablet (500 mg total) by mouth every 8 (eight) hours as needed for muscle spasms.   metoprolol succinate (TOPROL-XL) 25 MG 24 hr tablet  TAKE 1 TABLET(25 MG) BY MOUTH DAILY WITH OR IMMEDIATELY FOLLOWING A MEAL   montelukast (SINGULAIR) 10 MG tablet Take 1 tablet (10 mg total) by mouth daily.   nitroGLYCERIN (NITROSTAT) 0.4 MG SL tablet DISSOLVE 1 TABLET UNDER THE TONGUE EVERY 5 MINUTES FOR 3 DOSES AS NEEDED FOR CHEST PAIN   pantoprazole (PROTONIX) 40 MG tablet Take 1 tablet (40 mg total) by mouth 2 (two) times daily before a meal. (Patient taking differently: Take 40 mg by mouth daily.)   phenazopyridine (PYRIDIUM) 100 MG tablet Take 100 mg by mouth 3 (three) times daily.   potassium chloride SA (KLOR-CON M) 20 MEQ tablet Take 1 tablet (20 mEq total) by mouth 2 (two) times daily. (Patient taking differently: Take 40 mEq by mouth daily.)   rosuvastatin (CRESTOR) 40 MG tablet Take 1 tablet (40 mg total) by mouth daily.   spironolactone (ALDACTONE) 50 MG tablet Take 1 tablet (50 mg total) by mouth daily.   tiotropium (SPIRIVA HANDIHALER) 18 MCG inhalation capsule Place 18 mcg into inhaler and inhale daily.   Tiotropium Bromide-Olodaterol (STIOLTO RESPIMAT) 2.5-2.5 MCG/ACT AERS Inhale 2 puffs into the lungs daily.   warfarin (COUMADIN) 5 MG tablet Take 7.5 mg on Monday and Wed and 5 mg all other days (Patient taking differently: Take 5 mg by mouth See admin instructions. Take Thursday, Friday, Saturday and Sunday at bedtime)   warfarin (COUMADIN) 7.5 MG tablet Take 7.5 mg on Monday and Wednesday and 5 mg all other days (Patient taking differently: Take 7.5 mg by mouth See admin instructions. Take 7.5 mg on Monday, Tuesday and Wednesday at bedtime)   Accu-Chek Softclix Lancets lancets Use as instructed   No facility-administered encounter medications on file as of 10/14/2022.    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 01/01/2017 01/01/2017   Past Surgical History:  Procedure Laterality Date   ATRIAL FIBRILLATION ABLATION N/A 09/05/2022   Procedure: ATRIAL FIBRILLATION ABLATION;  Surgeon: Regan Lemming, MD;  Location: [facility];  Service: Cardiovascular;  Laterality: N/A;   CARDIAC CATHETERIZATION     CARDIOVERSION N/A 04/02/2022   Procedure: CARDIOVERSION;  Surgeon: Elder Negus, MD;  Location: [facility] ENDOSCOPY;  Service: Cardiovascular;  Laterality: N/A;   CARDIOVERSION N/A 04/27/2022   Procedure: CARDIOVERSION;  Surgeon: Elder Negus, MD;  Location: [facility] ENDOSCOPY;  Service: Cardiovascular;  Laterality: N/A;   CHOLECYSTECTOMY     CORONARY/GRAFT ANGIOGRAPHY  N/A 08/18/2018   Procedure: CORONARY/GRAFT ANGIOGRAPHY;  Surgeon: Elder Negus, MD;  Location: [facility];  Service: Cardiovascular;  Laterality: N/A;   LEG SURGERY     "metal plate in leg"   RIGHT HEART CATH AND CORONARY/GRAFT ANGIOGRAPHY N/A 07/21/2018   Procedure: RIGHT HEART CATH AND CORONARY/GRAFT ANGIOGRAPHY;  Surgeon: Elder Negus, MD;  Location: [facility];  Service: Cardiovascular;  Laterality: N/A;   TEE WITHOUT CARDIOVERSION N/A 07/27/2015   Procedure: TRANSESOPHAGEAL ECHOCARDIOGRAM (TEE);  Surgeon: Thurmon Fair, MD;  Location[facility] ENDOSCOPY;  Service: Cardiovascular;  Laterality: N/A;   TEE WITHOUT CARDIOVERSION N/A 07/21/2018   Procedure: TRANSESOPHAGEAL ECHOCARDIOGRAM (TEE);  Surgeon: Elder Negus, MD;  Location[facility] ENDOSCOPY;  Service: Cardiovascular;  Laterality: N/A;   TONSILLECTOMY AND ADENOIDECTOMY     Family History  Problem Relation Age of Onset   Cancer Mother    Hypertension Mother    Diabetes Mother     Hyperlipidemia Mother    Heart disease Mother    Cancer Father    Hypertension Father    Diabetes Father    Heart disease Father    Hypertension Sister    Hypertension Brother    Hypertension Brother    Schizophrenia Maternal Grandmother    Social History   Socioeconomic History   Marital status: Divorced    Spouse name: Not on file   Number of children: 4   Years of education: Not on file   Highest education level: GED or equivalent  Occupational History   Not on file  Tobacco Use   Smoking status: Former    Current packs/day: 0.00    Average packs/day: 1.5 packs/day for 40.0 years (60.0 ttl pk-yrs)    Types: Cigarettes    Start date: 12/23/1976    Quit date: 12/23/2016    Years since quitting: 5.8   Smokeless tobacco: Never   Tobacco comments:    Former smoker 10/03/22  Vaping Use   Vaping status: Never Used  Substance and Sexual Activity   Alcohol use: Not Currently   Drug use: Yes    Types: Hydrocodone   Sexual activity: Not Currently  Other Topics Concern   Not on file  Social History Narrative   ** Merged History Encounter **       Epworth Sleepiness Scale = 4 (as of 06/28/2015)   Social Determinants of Health   Financial Resource Strain: Low Risk  (10/14/2022)   Overall Financial Resource Strain (CARDIA)    Difficulty of Paying Living Expenses: Not hard at all  Food Insecurity: No Food Insecurity (10/14/2022)   Hunger Vital Sign    Worried About Running Out of Food in the Last Year: Never true    Ran Out of Food in the Last Year: Never true  Transportation Needs: No Transportation Needs (10/14/2022)   PRAPARE - Administrator, Civil Service (Medical): No    Lack of Transportation (Non-Medical): No  Physical Activity: Inactive (10/14/2022)   Exercise Vital Sign    Days of Exercise per Week: 0 days    Minutes of Exercise per Session: 0 min  Stress: Stress Concern Present (10/14/2022)   [employer] of Occupational Health - Occupational  Stress Questionnaire    Feeling of Stress : To some extent  Social Connections: Moderately Isolated (10/14/2022)   Social Connection and Isolation Panel [NHANES]    Frequency of Communication with Friends and Family: More than three times a week    Frequency of Social  Gatherings with Friends and Family: Never    Attends Religious Services: Never    Database administrator or Organizations: No    Attends Engineer, structural: Never    Marital Status: Living with partner    Tobacco Counseling Counseling given: Not Answered Tobacco comments: Former smoker 10/03/22   Clinical Intake:  Pre-visit preparation completed: Yes  Pain : No/denies pain     Nutritional Risks: None Diabetes: Yes CBG done?: No Did pt. bring in CBG monitor from home?: No  How often do you need to have someone help you when you read instructions, pamphlets, or other written materials from your doctor or pharmacy?: 1 - Never  Interpreter Needed?: No  Information entered by :: NAllen LPN   Activities of Daily Living    10/14/2022   10:42 AM 04/25/2022    7:54 PM  In your present state of health, do you have any difficulty performing the following activities:  Hearing? 1 0  Comment has hearing aids, but one is broken   Vision? 1 0  Comment blurry at times   Difficulty concentrating or making decisions? 0 0  Walking or climbing stairs? 0 1  Dressing or bathing? 0 0  Doing errands, shopping? 0 0  Preparing Food and eating ? N   Using the Toilet? N   In the past six months, have you accidently leaked urine? N   Do you have problems with loss of bowel control? N   Managing your Medications? N   Managing your Finances? N   Housekeeping or managing your Housekeeping? N     Patient Care Team: Nche, [name], NP as PCP - General (Internal Medicine) [name], MD as PCP - Sleep Medicine (Cardiology) Elder Negus, MD as PCP - Cardiology (Cardiology) Regan Lemming, MD as  PCP - Electrophysiology (Cardiology)  Indicate any recent Medical Services you may have received from other than [facility] providers in the past year (date may be approximate).     Assessment:   This is a routine wellness examination for Kimberly Hobbs.  Hearing/Vision screen Hearing Screening - Comments:: Has a broken hearing aid Vision Screening - Comments:: Regular eye exams, In [facility]  Dietary issues and exercise activities discussed:     Goals Addressed             This Visit's Progress    Patient Stated       10/14/2022, [facility]       Depression Screen    10/14/2022   10:49 AM 08/23/2022   10:32 AM 07/08/2022    9:52 AM 06/10/2022    9:24 AM 03/26/2022   11:02 AM 02/26/2022   11:17 AM 02/08/2022   10:26 AM  PHQ 2/9 Scores  PHQ - 2 Score 1 2 0 1 6 0 0  PHQ- 9 Score 2   9       Fall Risk    10/14/2022   10:48 AM 08/23/2022   10:32 AM 07/08/2022    9:52 AM 06/10/2022    9:19 AM 03/26/2022   11:02 AM  Fall Risk   Falls in the past year? 0 0 0 0 0  Number falls in past yr: 0    0  Injury with Fall? 0    0  Risk for fall due to : Medication side effect      Follow up Falls prevention discussed;Falls evaluation completed  MEDICARE RISK AT HOME: Medicare Risk at Home Any stairs in or around the home?: Yes If so, are there any without handrails?: No Home free of loose throw rugs in walkways, pet beds, electrical cords, etc?: Yes Adequate lighting in your home to reduce risk of falls?: Yes Life alert?: No Use of a cane, walker or w/c?: No Grab bars in the bathroom?: No Shower chair or bench in shower?: Yes Elevated toilet seat or a handicapped toilet?: Yes  TIMED UP AND GO:  Was the test performed?  No    Cognitive Function:        10/14/2022   10:50 AM 10/10/2021   11:07 AM  6CIT Screen  What Year? 0 points 0 points  What month? 0 points 0 points  What time? 0 points 0 points  Count back from 20 0 points 0 points  Months in  reverse 0 points 0 points  Repeat phrase 0 points 0 points  Total Score 0 points 0 points    Immunizations Immunization History  Administered Date(s) Administered   Fluad Quad(high Dose 65+) 02/01/2021   Influenza,inj,Quad PF,6+ Mos 12/13/2021   Influenza-Unspecified 01/14/2019   Zoster Recombinant(Shingrix) 05/19/2021, 09/18/2021    TDAP status: Up to date  Flu Vaccine status: Due, Education has been provided regarding the importance of this vaccine. Advised may receive this vaccine at local pharmacy or Health Dept. Aware to provide a copy of the vaccination record if obtained from local pharmacy or Health Dept. Verbalized acceptance and understanding.  Pneumococcal vaccine status: Up to date  Covid-19 vaccine status: Declined, Education has been provided regarding the importance of this vaccine but patient still declined. Advised may receive this vaccine at local pharmacy or Health Dept.or vaccine clinic. Aware to provide a copy of the vaccination record if obtained from local pharmacy or Health Dept. Verbalized acceptance and understanding.  Qualifies for Shingles Vaccine? Yes   Zostavax completed Yes   Shingrix Completed?: Yes  Screening Tests Health Maintenance  Topic Date Due   OPHTHALMOLOGY EXAM  07/14/2022   MAMMOGRAM  07/27/2022   INFLUENZA VACCINE  09/19/2022   Lung Cancer Screening  11/14/2022   Hepatitis C Screening  09/24/2023 (Originally 08/23/1978)   HIV Screening  09/24/2023 (Originally 08/22/1975)   FOOT EXAM  03/27/2023   HEMOGLOBIN A1C  03/27/2023   Diabetic kidney evaluation - Urine ACR  07/29/2023   Diabetic kidney evaluation - eGFR measurement  10/01/2023   Medicare Annual Wellness (AWV)  10/14/2023   PAP SMEAR-Modifier  05/10/2024   Colonoscopy  02/04/2026   Zoster Vaccines- Shingrix  Completed   HPV VACCINES  Aged Out   DTaP/Tdap/Td  Discontinued   COVID-19 Vaccine  Discontinued    Health Maintenance  Health Maintenance Due  Topic Date Due    OPHTHALMOLOGY EXAM  07/14/2022   MAMMOGRAM  07/27/2022   INFLUENZA VACCINE  09/19/2022   Lung Cancer Screening  11/14/2022    Colorectal cancer screening: Type of screening: Colonoscopy. Completed 02/05/2016. Repeat every 10 years  Mammogram status: scheduled for 11/04/2022  Bone Density status: n/a  Lung Cancer Screening: (Low Dose CT Chest recommended if Age 44-80 years, 20 pack-year currently smoking OR have quit w/in 15years.) does not qualify.   Lung Cancer Screening Referral: no  Additional Screening:  Hepatitis C Screening: does not qualify;   Vision Screening: Recommended annual ophthalmology exams for early detection of glaucoma and other disorders of the eye. Is the patient up to date with their annual eye exam?  No  Who is the provider or what is the name of the office in which the patient attends annual eye exams? In [place] If pt is not established with a provider, would they like to be referred to a provider to establish care? No .   Dental Screening: Recommended annual dental exams for proper oral hygiene  Diabetic Foot Exam: Diabetic Foot Exam: Completed 03/26/2022  Community Resource Referral / Chronic Care Management: CRR required this visit?  No   CCM required this visit?  No     Plan:     I have personally reviewed and noted the following in the patient's chart:   Medical and social history Use of alcohol, tobacco or illicit drugs  Current medications and supplements including opioid prescriptions. Patient is currently taking opioid prescriptions. Information provided to patient regarding non-opioid alternatives. Patient advised to discuss non-opioid treatment plan with their provider. Functional ability and status Nutritional status Physical activity Advanced directives List of other physicians Hospitalizations, surgeries, and ER visits in previous 12 months Vitals Screenings to include cognitive, depression, and falls Referrals and  appointments  In addition, I have reviewed and discussed with patient certain preventive protocols, quality metrics, and best practice recommendations. A written personalized care plan for preventive services as well as general preventive health recommendations were provided to patient.     Barb Merino, LPN   5/62/[id number]   After Visit Summary: (MyChart) Due to this being a telephonic visit, the after visit summary with patients personalized plan was offered to patient via MyChart   Nurse Notes: none

## 2022-10-14 NOTE — Patient Instructions (Addendum)
Kimberly Hobbs , Thank you for taking time to come for your Medicare Wellness Visit. I appreciate your ongoing commitment to your health goals. Please review the following plan we discussed and let me know if I can assist you in the future.   Referrals/Orders/Follow-Ups/Clinician Recommendations: none  Managing Pain Without Opioids Opioids are strong medicines used to treat moderate to severe pain. For some people, especially those who have long-term (chronic) pain, opioids may not be the best choice for pain management due to: Side effects like nausea, constipation, and sleepiness. The risk of addiction (opioid use disorder). The longer you take opioids, the greater your risk of addiction. Pain that lasts for more than 3 months is called chronic pain. Managing chronic pain usually requires more than one approach and is often provided by a team of health care providers working together (multidisciplinary approach). Pain management may be done at a pain management center or pain clinic. How to manage pain without the use of opioids Use non-opioid medicines Non-opioid medicines for pain may include: Over-the-counter or prescription non-steroidal anti-inflammatory drugs (NSAIDs). These may be the first medicines used for pain. They work well for muscle and bone pain, and they reduce swelling. Acetaminophen. This over-the-counter medicine may work well for milder pain but not swelling. Antidepressants. These may be used to treat chronic pain. A certain type of antidepressant (tricyclics) is often used. These medicines are given in lower doses for pain than when used for depression. Anticonvulsants. These are usually used to treat seizures but may also reduce nerve (neuropathic) pain. Muscle relaxants. These relieve pain caused by sudden muscle tightening (spasms). You may also use a pain medicine that is applied to the skin as a patch, cream, or gel (topical analgesic), such as a numbing medicine. These  may cause fewer side effects than medicines taken by mouth. Do certain therapies as directed Some therapies can help with pain management. They include: Physical therapy. You will do exercises to gain strength and flexibility. A physical therapist may teach you exercises to move and stretch parts of your body that are weak, stiff, or painful. You can learn these exercises at physical therapy visits and practice them at home. Physical therapy may also involve: Massage. Heat wraps or applying heat or cold to affected areas. Electrical signals that interrupt pain signals (transcutaneous electrical nerve stimulation, TENS). Weak lasers that reduce pain and swelling (low-level laser therapy). Signals from your body that help you learn to regulate pain (biofeedback). Occupational therapy. This helps you to learn ways to function at home and work with less pain. Recreational therapy. This involves trying new activities or hobbies, such as a physical activity or drawing. Mental health therapy, including: Cognitive behavioral therapy (CBT). This helps you learn coping skills for dealing with pain. Acceptance and commitment therapy (ACT) to change the way you think and react to pain. Relaxation therapies, including muscle relaxation exercises and mindfulness-based stress reduction. Pain management counseling. This may be individual, family, or group counseling.  Receive medical treatments Medical treatments for pain management include: Nerve block injections. These may include a pain blocker and anti-inflammatory medicines. You may have injections: Near the spine to relieve chronic back or neck pain. Into joints to relieve back or joint pain. Into nerve areas that supply a painful area to relieve body pain. Into muscles (trigger point injections) to relieve some painful muscle conditions. A medical device placed near your spine to help block pain signals and relieve nerve pain or chronic back pain  (spinal  cord stimulation device). Acupuncture. Follow these instructions at home Medicines Take over-the-counter and prescription medicines only as told by your health care provider. If you are taking pain medicine, ask your health care providers about possible side effects to watch out for. Do not drive or use heavy machinery while taking prescription opioid pain medicine. Lifestyle  Do not use drugs or alcohol to reduce pain. If you drink alcohol, limit how much you have to: 0-1 drink a day for women who are not pregnant. 0-2 drinks a day for men. Know how much alcohol is in a drink. In the [place] one drink equals one 12 oz bottle of beer (355 mL), one 5 oz glass of wine (148 mL), or one 1 oz glass of hard liquor (44 mL). Do not use any products that contain nicotine or tobacco. These products include cigarettes, chewing tobacco, and vaping devices, such as e-cigarettes. If you need help quitting, ask your health care provider. Eat a healthy diet and maintain a healthy weight. Poor diet and excess weight may make pain worse. Eat foods that are high in fiber. These include fresh fruits and vegetables, whole grains, and beans. Limit foods that are high in fat and processed sugars, such as fried and sweet foods. Exercise regularly. Exercise lowers stress and may help relieve pain. Ask your health care provider what activities and exercises are safe for you. If your health care provider approves, join an exercise class that combines movement and stress reduction. Examples include yoga and tai chi. Get enough sleep. Lack of sleep may make pain worse. Lower stress as much as possible. Practice stress reduction techniques as told by your therapist. General instructions Work with all your pain management providers to find the treatments that work best for you. You are an important member of your pain management team. There are many things you can do to reduce pain on your own. Consider joining an  online or in-person support group for people who have chronic pain. Keep all follow-up visits. This is important. Where to find more information You can find more information about managing pain without opioids from: American Academy of Pain Medicine: painmed.org Institute for Chronic Pain: instituteforchronicpain.org American Chronic Pain Association: theacpa.org Contact a health care provider if: You have side effects from pain medicine. Your pain gets worse or does not get better with treatments or home therapy. You are struggling with anxiety or depression. Summary Many types of pain can be managed without opioids. Chronic pain may respond better to pain management without opioids. Pain is best managed when you and a team of health care providers work together. Pain management without opioids may include non-opioid medicines, medical treatments, physical therapy, mental health therapy, and lifestyle changes. Tell your health care providers if your pain gets worse or is not being managed well enough. This information is not intended to replace advice given to you by your health care provider. Make sure you discuss any questions you have with your health care provider. Document Revised: 05/17/2020 Document Reviewed: 05/17/2020 Elsevier Patient Education  2024 Elsevier Inc.   This is a list of the screening recommended for you and due dates:  Health Maintenance  Topic Date Due   Eye exam for diabetics  07/14/2022   Mammogram  07/27/2022   Flu Shot  09/19/2022   Screening for Lung Cancer  11/14/2022   Hepatitis C Screening  09/24/2023*   HIV Screening  09/24/2023*   Complete foot exam   03/27/2023   Hemoglobin A1C  03/27/2023   Yearly kidney health urinalysis for diabetes  07/29/2023   Yearly kidney function blood test for diabetes  10/01/2023   Medicare Annual Wellness Visit  10/14/2023   Pap Smear  05/10/2024   Colon Cancer Screening  02/04/2026   Zoster (Shingles) Vaccine   Completed   HPV Vaccine  Aged Out   DTaP/Tdap/Td vaccine  Discontinued   COVID-19 Vaccine  Discontinued  *Topic was postponed. The date shown is not the original due date.    Advanced directives: (ACP Link)Information on Advanced Care Planning can be found at [facility] Advance Health Care Directives Advance Health Care Directives ([email or web address]   Next Medicare Annual Wellness Visit scheduled for next year: Yes  insert Preventive Care Attachment Reference

## 2022-10-15 ENCOUNTER — Ambulatory Visit: Payer: 59 | Admitting: Physical Medicine & Rehabilitation

## 2022-10-15 NOTE — Progress Notes (Signed)
No action done

## 2022-10-16 DIAGNOSIS — M545 Low back pain, unspecified: Secondary | ICD-10-CM | POA: Diagnosis not present

## 2022-10-16 DIAGNOSIS — M6281 Muscle weakness (generalized): Secondary | ICD-10-CM | POA: Diagnosis not present

## 2022-10-20 DIAGNOSIS — G4733 Obstructive sleep apnea (adult) (pediatric): Secondary | ICD-10-CM | POA: Diagnosis not present

## 2022-10-23 ENCOUNTER — Encounter: Payer: Self-pay | Admitting: Cardiology

## 2022-10-23 DIAGNOSIS — M6281 Muscle weakness (generalized): Secondary | ICD-10-CM | POA: Diagnosis not present

## 2022-10-23 DIAGNOSIS — M545 Low back pain, unspecified: Secondary | ICD-10-CM | POA: Diagnosis not present

## 2022-10-25 ENCOUNTER — Encounter: Payer: Self-pay | Admitting: Cardiology

## 2022-10-25 DIAGNOSIS — I480 Paroxysmal atrial fibrillation: Secondary | ICD-10-CM | POA: Diagnosis not present

## 2022-10-25 DIAGNOSIS — Z7901 Long term (current) use of anticoagulants: Secondary | ICD-10-CM | POA: Diagnosis not present

## 2022-10-25 DIAGNOSIS — Z952 Presence of prosthetic heart valve: Secondary | ICD-10-CM | POA: Diagnosis not present

## 2022-10-25 LAB — POCT INR: INR: 2.5 (ref 2.0–3.0)

## 2022-10-25 NOTE — Telephone Encounter (Signed)
She will likely need closer monitoring of her INR as dexamethasone can affect it.  Thanks MJP

## 2022-10-25 NOTE — Telephone Encounter (Signed)
From patient.

## 2022-10-25 NOTE — Progress Notes (Unsigned)
This encounter was created in error - please disregard.  This encounter was created in error - please disregard.

## 2022-10-25 NOTE — Addendum Note (Signed)
Encounter addended by: Danice Goltz, PA on: 10/25/2022 3:12 PM  Actions taken: Clinical Note Signed

## 2022-10-28 ENCOUNTER — Ambulatory Visit (INDEPENDENT_AMBULATORY_CARE_PROVIDER_SITE_OTHER): Payer: 59 | Admitting: Nurse Practitioner

## 2022-10-28 ENCOUNTER — Encounter: Payer: Self-pay | Admitting: Nurse Practitioner

## 2022-10-28 VITALS — BP 100/58 | HR 70 | Temp 98.0°F | Ht 65.0 in | Wt 204.0 lb

## 2022-10-28 DIAGNOSIS — R300 Dysuria: Secondary | ICD-10-CM

## 2022-10-28 DIAGNOSIS — Z23 Encounter for immunization: Secondary | ICD-10-CM

## 2022-10-28 DIAGNOSIS — N3000 Acute cystitis without hematuria: Secondary | ICD-10-CM | POA: Diagnosis not present

## 2022-10-28 LAB — POC URINALSYSI DIPSTICK (AUTOMATED)
Bilirubin, UA: NEGATIVE
Glucose, UA: POSITIVE — AB
Ketones, UA: NEGATIVE
Nitrite, UA: NEGATIVE
Protein, UA: POSITIVE — AB
Spec Grav, UA: 1.015 (ref 1.010–1.025)
Urobilinogen, UA: 0.2 U/dL
pH, UA: 6 (ref 5.0–8.0)

## 2022-10-28 MED ORDER — NITROFURANTOIN MONOHYD MACRO 100 MG PO CAPS: 100.0000 mg | ORAL_CAPSULE | Freq: Two times a day (BID) | ORAL | 0 refills | Status: DC

## 2022-10-28 NOTE — Patient Instructions (Addendum)
Yes continue iron supplement Urine sent for culture Maintain adequate oral hydration Start macrobid

## 2022-10-28 NOTE — Progress Notes (Signed)
Established Patient Visit  Patient: Kimberly Hobbs   DOB: September 23, 1960   62 y.o. Female  MRN: [id number] Visit Date: 10/28/2022  Subjective:    Chief Complaint  Patient presents with   painful urination     Burns when urinating    Urinary Tract Infection  This is a new problem. The current episode started in the past 7 days. The problem has been unchanged. The quality of the pain is described as burning. She is Sexually active. There is No history of pyelonephritis. Associated symptoms include frequency. Pertinent negatives include no chills, discharge, flank pain, hematuria, hesitancy, nausea, possible pregnancy, sweats, urgency or vomiting. She has tried nothing for the symptoms. There is no history of catheterization, kidney stones, recurrent UTIs, a single kidney, urinary stasis or a urological procedure.  Reports another UTI infection 6months ago.  Reviewed medical, surgical, and social history today  Medications: Outpatient Medications Prior to Visit  Medication Sig   Accu-Chek Softclix Lancets lancets Use as instructed   albuterol (VENTOLIN HFA) 108 (90 Base) MCG/ACT inhaler Inhale 2 puffs into the lungs every 6 (six) hours as needed for wheezing or shortness of breath.   amiodarone (PACERONE) 200 MG tablet Take by mouth.   aspirin EC 81 MG tablet Take 81 mg by mouth daily.    azelastine (ASTELIN) 0.1 % nasal spray Place 1 spray into both nostrils 2 (two) times daily. Use in each nostril as directed (Patient taking differently: Place 1 spray into both nostrils daily as needed for allergies. Use in each nostril as directed)   Blood Glucose Monitoring Suppl (ACCU-CHEK GUIDE) w/Device KIT Check glucose every morning   clonazePAM (KLONOPIN) 0.5 MG tablet Take 1.5 tablets (0.75 mg total) by mouth at bedtime.   diclofenac Sodium (VOLTAREN ARTHRITIS PAIN) 1 % GEL Apply 4 g topically 4 (four) times daily. (Patient taking differently: Apply 4 g topically 4 (four) times daily  as needed (pain).)   diltiazem (CARDIZEM CD) 180 MG 24 hr capsule TAKE 1 CAPSULE BY MOUTH DAILY   empagliflozin (JARDIANCE) 10 MG TABS tablet TAKE 1 TABLET(10 MG) BY MOUTH DAILY BEFORE BREAKFAST   Ferrous Sulfate (IRON PO) Take 1 tablet by mouth daily. Blood builder   furosemide (LASIX) 20 MG tablet TAKE 2 TABLETS(40 MG) BY MOUTH TWICE DAILY   gabapentin (NEURONTIN) 100 MG capsule Take 1 capsule (100 mg total) by mouth at bedtime.   glucose blood (ACCU-CHEK GUIDE) test strip Use as instructed   HYDROcodone-acetaminophen (NORCO) 7.5-325 MG tablet Take 1 tablet by mouth every 12 (twelve) hours as needed for moderate pain.   ipratropium (ATROVENT) 0.03 % nasal spray Place 1 spray into both nostrils 2 (two) times daily as needed for rhinitis.   isosorbide mononitrate (IMDUR) 60 MG 24 hr tablet Take 1 tablet (60 mg total) by mouth daily.   levalbuterol (XOPENEX HFA) 45 MCG/ACT inhaler Inhale 1-2 puffs into the lungs every 6 (six) hours as needed for shortness of breath.   levalbuterol (XOPENEX) 0.63 MG/3ML nebulizer solution Take 3 mLs (0.63 mg total) by nebulization every 4 (four) hours as needed for wheezing or shortness of breath.   losartan (COZAAR) 50 MG tablet TAKE 1 TABLET(50 MG) BY MOUTH DAILY   metFORMIN (GLUCOPHAGE) 500 MG tablet TAKE 1 TABLET(500 MG) BY MOUTH TWICE DAILY WITH A MEAL   methocarbamol (ROBAXIN) 500 MG tablet Take 1 tablet (500 mg total) by mouth every 8 (eight) hours  as needed for muscle spasms.   metoprolol succinate (TOPROL-XL) 25 MG 24 hr tablet TAKE 1 TABLET(25 MG) BY MOUTH DAILY WITH OR IMMEDIATELY FOLLOWING A MEAL   montelukast (SINGULAIR) 10 MG tablet Take 1 tablet (10 mg total) by mouth daily.   nitroGLYCERIN (NITROSTAT) 0.4 MG SL tablet DISSOLVE 1 TABLET UNDER THE TONGUE EVERY 5 MINUTES FOR 3 DOSES AS NEEDED FOR CHEST PAIN   pantoprazole (PROTONIX) 40 MG tablet Take 1 tablet (40 mg total) by mouth 2 (two) times daily before a meal. (Patient taking differently: Take 40  mg by mouth daily.)   phenazopyridine (PYRIDIUM) 100 MG tablet Take 100 mg by mouth 3 (three) times daily.   potassium chloride SA (KLOR-CON M) 20 MEQ tablet Take 1 tablet (20 mEq total) by mouth 2 (two) times daily. (Patient taking differently: Take 40 mEq by mouth daily.)   rosuvastatin (CRESTOR) 40 MG tablet Take 1 tablet (40 mg total) by mouth daily.   spironolactone (ALDACTONE) 50 MG tablet Take 1 tablet (50 mg total) by mouth daily.   tiotropium (SPIRIVA HANDIHALER) 18 MCG inhalation capsule Place 18 mcg into inhaler and inhale daily.   Tiotropium Bromide-Olodaterol (STIOLTO RESPIMAT) 2.5-2.5 MCG/ACT AERS Inhale 2 puffs into the lungs daily.   warfarin (COUMADIN) 5 MG tablet Take 7.5 mg on Monday and Wed and 5 mg all other days (Patient taking differently: Take 5 mg by mouth See admin instructions. Take Thursday, Friday, Saturday and Sunday at bedtime)   warfarin (COUMADIN) 7.5 MG tablet Take 7.5 mg on Monday and Wednesday and 5 mg all other days (Patient taking differently: Take 7.5 mg by mouth See admin instructions. Take 7.5 mg on Monday, Tuesday and Wednesday at bedtime)   No facility-administered medications prior to visit.   Reviewed past medical and social history.   ROS per HPI above      Objective:  BP (!) 100/58   Pulse 70   Temp 98 F (36.7 C) (Temporal)   Ht 5\' 5"  (1.651 m)   Wt 204 lb (92.5 kg)   LMP 06/20/2015   SpO2 100%   BMI 33.95 kg/m      Physical Exam Cardiovascular:     Pulses: Normal pulses.  Pulmonary:     Effort: Pulmonary effort is normal.  Abdominal:     General: There is no distension.     Palpations: Abdomen is soft.     Tenderness: There is no abdominal tenderness. There is no right CVA tenderness, left CVA tenderness or guarding.  Neurological:     Mental Status: She is oriented to person, place, and time.     Results for orders placed or performed in visit on 10/28/22  POCT Urinalysis Dipstick (Automated)  Result Value Ref Range    Color, UA Yellow    Clarity, UA Clear    Glucose, UA Positive (A) Negative   Bilirubin, UA Negative    Ketones, UA Negative    Spec Grav, UA 1.015 1.010 - 1.025   Blood, UA 1+    pH, UA 6.0 5.0 - 8.0   Protein, UA Positive (A) Negative   Urobilinogen, UA 0.2 0.2 or 1.0 E.U./dL   Nitrite, UA Negative    Leukocytes, UA Trace (A) Negative      Assessment & Plan:    Problem List Items Addressed This Visit   None Visit Diagnoses     Dysuria    -  Primary   Relevant Medications   nitrofurantoin, macrocrystal-monohydrate, (MACROBID) 100 MG capsule  Other Relevant Orders   POCT Urinalysis Dipstick (Automated) (Completed)   Urine Culture   Need for influenza vaccination       Relevant Orders   Flu Vaccine Trivalent High Dose (Fluad) (Completed)      Return if symptoms worsen or fail to improve.     [name], NP

## 2022-10-28 NOTE — Telephone Encounter (Signed)
From patient.

## 2022-10-29 ENCOUNTER — Encounter: Payer: 59 | Attending: Physical Medicine & Rehabilitation | Admitting: Physical Medicine & Rehabilitation

## 2022-10-29 ENCOUNTER — Encounter: Payer: Self-pay | Admitting: Nurse Practitioner

## 2022-10-29 ENCOUNTER — Encounter: Payer: Self-pay | Admitting: Physical Medicine & Rehabilitation

## 2022-10-29 VITALS — BP 99/67 | HR 68 | Ht 65.0 in | Wt 206.0 lb

## 2022-10-29 DIAGNOSIS — M545 Low back pain, unspecified: Secondary | ICD-10-CM | POA: Insufficient documentation

## 2022-10-29 DIAGNOSIS — Z5181 Encounter for therapeutic drug level monitoring: Secondary | ICD-10-CM | POA: Diagnosis not present

## 2022-10-29 DIAGNOSIS — R233 Spontaneous ecchymoses: Secondary | ICD-10-CM

## 2022-10-29 DIAGNOSIS — G8929 Other chronic pain: Secondary | ICD-10-CM | POA: Insufficient documentation

## 2022-10-29 DIAGNOSIS — F411 Generalized anxiety disorder: Secondary | ICD-10-CM | POA: Diagnosis present

## 2022-10-29 DIAGNOSIS — G894 Chronic pain syndrome: Secondary | ICD-10-CM | POA: Insufficient documentation

## 2022-10-29 DIAGNOSIS — Z79891 Long term (current) use of opiate analgesic: Secondary | ICD-10-CM | POA: Insufficient documentation

## 2022-10-29 MED ORDER — CYCLOBENZAPRINE HCL 10 MG PO TABS: 10.0000 mg | ORAL_TABLET | Freq: Three times a day (TID) | ORAL | 1 refills | Status: DC | PRN

## 2022-10-29 MED ORDER — DULOXETINE HCL 30 MG PO CPEP: 30.0000 mg | ORAL_CAPSULE | Freq: Every day | ORAL | 3 refills | Status: DC

## 2022-10-29 MED ORDER — DULOXETINE HCL 20 MG PO CPEP: 20.0000 mg | ORAL_CAPSULE | Freq: Every day | ORAL | 3 refills | Status: DC

## 2022-10-29 NOTE — Progress Notes (Signed)
Subjective:    Patient ID: Kimberly Hobbs, female    DOB: December 04, 1960, 62 y.o.   MRN: [id number]  HPI   Kimberly Hobbs is a 62 y.o. year old female  who  has a past medical history of CKD stage 3 due to type 2 diabetes mellitus (HCC) (05/15/2021), COVID-19, Hyperlipidemia, Hypertension, Pain in both lower extremities (07/23/2019), and Rheumatic heart disease.   They are presenting to PM&R clinic as a new patient for pain management evaluation. They were referred by [name] for treatment of low back pain.  She cant stand for long. Has to use a walker.  Pain is severe if she walks without this. Pain started about 2 years ago but is worsening. 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  Pain is mostly on the right side of her lower back. Sometimes both sides. It doesn't shoot down her legs.  Lying down helps the pain.  She reports her activities are very limited by her pain.  She has been taking hydrocodone 5/325 1 pain is very severe. History of prosthetic valves, schedule for ablation for A-fib       Red flag symptoms: No red flags for back pain endorsed in Hx or ROS   Medications tried: Topical medications - doesn't recall name, didn't help Nsaids- cannot take for cardiac reasons Tylenol- doesn't help Opiates  Hydrocodone -she uses sparingly when pain is very severe, this does help control her pain Gabapentin / Lyrica  Has not tried  Tramadol-does not remember TCAs  - Denies  She is currently on Celexa for her anxiety, this medication visit has been working very well for her Robaxin helps pain   Other treatments: PT/OT  has not tried, in afib waiting on heart ablation  TENs unit - has not tried  Injections-denies Surgery - denies      Interal History 08/23/22   Kimberly Hobbs is here for follow-up regarding her chronic pain.  She reports she continues to have right-sided back pain.  She also has some aching pain in her  legs bilaterally.  She reports she has her cardiac ablation procedures 09/05/2022 so after this time she would be interested in getting facet joint injections.  Pain is worsened with standing and she continues to use her walker for ambulation.  Pain continues to be improved with lying down.  Vicodin 7.5 is helping her control this pain.  She tried to use this sparingly only when pain is very severe and 30 tablets has lasted her about a month and a half.  She is not having any side effects with this medication.   Interal History 10/29/2022 Kimberly Hobbs is here for follow-up regarding her right-sided back pain with aching pain in her legs.  She continues to use occasional Vicodin 7.5 which helps keep her pain controlled when it is very severe.  She tries to use this sparingly.  She has been started on gabapentin 100 mg at bedtime which has greatly reduced her bilateral leg pain.  She reports her Celexa was discontinued because it was no longer helping.  She says she is currently not on any antidepressant medicines for anxiety.  She has had a few sessions of physical therapy but has not had significant benefits at this time. Patient has been using occasional Flexeril 10 mg with benefit, she does not use this when she takes for Norco.     Pain Inventory Average Pain 9  Pain Right Now 3 My pain is sharp, burning, stabbing, tingling, and aching  In the last 24 hours, has pain interfered with the following? General activity 4 Relation with others 0 Enjoyment of life 5 What TIME of day is your pain at its worst? night Sleep (in general) Fair  Pain is worse with: walking, inactivity, and standing Pain improves with: pacing activities Relief from Meds: 10  Family History  Problem Relation Age of Onset   Cancer Mother    Hypertension Mother    Diabetes Mother    Hyperlipidemia Mother    Heart disease Mother    Cancer Father    Hypertension Father    Diabetes Father    Heart disease Father     Hypertension Sister    Hypertension Brother    Hypertension Brother    Schizophrenia Maternal Grandmother    Social History   Socioeconomic History   Marital status: Divorced    Spouse name: Not on file   Number of children: 4   Years of education: Not on file   Highest education level: GED or equivalent  Occupational History   Not on file  Tobacco Use   Smoking status: Former    Current packs/day: 0.00    Average packs/day: 1.5 packs/day for 40.0 years (60.0 ttl pk-yrs)    Types: Cigarettes    Start date: 12/23/1976    Quit date: 12/23/2016    Years since quitting: 5.8   Smokeless tobacco: Never   Tobacco comments:    Former smoker 10/03/22  Vaping Use   Vaping status: Never Used  Substance and Sexual Activity   Alcohol use: Not Currently   Drug use: Yes    Types: Hydrocodone   Sexual activity: Not Currently  Other Topics Concern   Not on file  Social History Narrative   ** Merged History Encounter **       Epworth Sleepiness Scale = 4 (as of 06/28/2015)   Social Determinants of Health   Financial Resource Strain: Low Risk  (10/14/2022)   Overall Financial Resource Strain (CARDIA)    Difficulty of Paying Living Expenses: Not hard at all  Food Insecurity: No Food Insecurity (10/14/2022)   Hunger Vital Sign    Worried About Running Out of Food in the Last Year: Never true    Ran Out of Food in the Last Year: Never true  Transportation Needs: No Transportation Needs (10/14/2022)   PRAPARE - Administrator, Civil Service (Medical): No    Lack of Transportation (Non-Medical): No  Physical Activity: Inactive (10/14/2022)   Exercise Vital Sign    Days of Exercise per Week: 0 days    Minutes of Exercise per Session: 0 min  Stress: Stress Concern Present (10/14/2022)   [employer] of Occupational Health - Occupational Stress Questionnaire    Feeling of Stress : To some extent  Social Connections: Moderately Isolated (10/14/2022)   Social Connection and  Isolation Panel [NHANES]    Frequency of Communication with Friends and Family: More than three times a week    Frequency of Social Gatherings with Friends and Family: Never    Attends Religious Services: Never    Database administrator or Organizations: No    Attends Banker Meetings: Never    Marital Status: Living with partner   Past Surgical History:  Procedure Laterality Date   ATRIAL FIBRILLATION ABLATION N/A 09/05/2022   Procedure: ATRIAL FIBRILLATION ABLATION;  Surgeon: Regan Lemming, MD;  [facility]:  [facility];  Service: Cardiovascular;  Laterality: N/A;   CARDIAC CATHETERIZATION     CARDIOVERSION N/A 04/02/2022   Procedure: CARDIOVERSION;  Surgeon: Elder Negus, MD;  Location: [facility];  Service: Cardiovascular;  Laterality: N/A;   CARDIOVERSION N/A 04/27/2022   Procedure: CARDIOVERSION;  Surgeon: Elder Negus, MD;  Location: [facility];  Service: Cardiovascular;  Laterality: N/A;   CHOLECYSTECTOMY     CORONARY/GRAFT ANGIOGRAPHY N/A 08/18/2018   Procedure: CORONARY/GRAFT ANGIOGRAPHY;  Surgeon: Elder Negus, MD;  Location: [facility];  Service: Cardiovascular;  Laterality: N/A;   LEG SURGERY     "metal plate in leg"   RIGHT HEART CATH AND CORONARY/GRAFT ANGIOGRAPHY N/A 07/21/2018   Procedure: RIGHT HEART CATH AND CORONARY/GRAFT ANGIOGRAPHY;  Surgeon: Elder Negus, MD;  Location: [facility];  Service: Cardiovascular;  Laterality: N/A;   TEE WITHOUT CARDIOVERSION N/A 07/27/2015   Procedure: TRANSESOPHAGEAL ECHOCARDIOGRAM (TEE);  Surgeon: Thurmon Fair, MD;  Location: [facility] ENDOSCOPY;  Service: Cardiovascular;  Laterality: N/A;   TEE WITHOUT CARDIOVERSION N/A 07/21/2018   Procedure: TRANSESOPHAGEAL ECHOCARDIOGRAM (TEE);  Surgeon: Elder Negus, MD;  Location: [facility] ENDOSCOPY;  Service: Cardiovascular;  Laterality: N/A;   TONSILLECTOMY AND ADENOIDECTOMY     Past Surgical History:  Procedure Laterality Date    ATRIAL FIBRILLATION ABLATION N/A 09/05/2022   Procedure: ATRIAL FIBRILLATION ABLATION;  Surgeon: Regan Lemming, MD;  [facility];  Service: Cardiovascular;  Laterality: N/A;   CARDIAC CATHETERIZATION     CARDIOVERSION N/A 04/02/2022   Procedure: CARDIOVERSION;  Surgeon: Elder Negus, MD;  [facility]: [facility];  Service: Cardiovascular;  Laterality: N/A;   CARDIOVERSION N/A 04/27/2022   Procedure: CARDIOVERSION;  Surgeon: Elder Negus, MD;  Location: [facility];  Service: Cardiovascular;  Laterality: N/A;   CHOLECYSTECTOMY     CORONARY/GRAFT ANGIOGRAPHY N/A 08/18/2018   Procedure: CORONARY/GRAFT ANGIOGRAPHY;  Surgeon: Elder Negus, MD;  [facility];  Service: Cardiovascular;  Laterality: N/A;   LEG SURGERY     "metal plate in leg"   RIGHT HEART CATH AND CORONARY/GRAFT ANGIOGRAPHY N/A 07/21/2018   Procedure: RIGHT HEART CATH AND CORONARY/GRAFT ANGIOGRAPHY;  Surgeon: Elder Negus, MD;  [facility];  Service: Cardiovascular;  Laterality: N/A;   TEE WITHOUT CARDIOVERSION N/A 07/27/2015   Procedure: TRANSESOPHAGEAL ECHOCARDIOGRAM (TEE);  Surgeon: Thurmon Fair, MD;  Location: [facility] ENDOSCOPY;  Service: Cardiovascular;  Laterality: N/A;   TEE WITHOUT CARDIOVERSION N/A 07/21/2018   Procedure: TRANSESOPHAGEAL ECHOCARDIOGRAM (TEE);  Surgeon: Elder Negus, MD;  Location: [facility] ENDOSCOPY;  Service: Cardiovascular;  Laterality: N/A;   TONSILLECTOMY AND ADENOIDECTOMY     Past Medical History:  Diagnosis Date   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   COVID-19    H/O mitral valve replacement with mechanical #25 mechanical SJM 01/01/2017) 07/17/2018   Hyperlipidemia    Hypertension    Pain in both lower extremities 07/23/2019   Paroxysmal atrial fibrillation (HCC) 06/29/2015   Rheumatic heart disease    MS/ AS   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 01/01/2017 01/01/2017    BP 99/67   Pulse 68   Ht 5\' 5"  (1.651 m)   Wt 206 lb (93.4 kg)   LMP 06/20/2015   SpO2 99%   BMI 34.28 kg/m   Opioid Risk Score:   Fall Risk Score:  `1  Depression screen [facility] 2/9     10/14/2022  10:49 AM 08/23/2022   10:32 AM 07/08/2022    9:52 AM 06/10/2022    9:24 AM 03/26/2022   11:02 AM 02/26/2022   11:17 AM 02/08/2022   10:26 AM  Depression screen PHQ 2/9  Decreased Interest 0 1 0 1 3 0 0  Down, Depressed, Hopeless 1 1 0 0 3 0 0  PHQ - 2 Score 1 2 0 1 6 0 0  Altered sleeping 0   3     Tired, decreased energy 1   2     Change in appetite 0   3     Feeling bad or failure about yourself  0   0     Trouble concentrating 0   0     Moving slowly or fidgety/restless 0   0     Suicidal thoughts 0   0     PHQ-9 Score 2   9     Difficult doing work/chores Somewhat difficult   Very difficult   Not difficult at all      Review of Systems  Musculoskeletal:  Positive for back pain.  Neurological:  Positive for weakness.  All other systems reviewed and are negative.     Objective:   Physical Exam   Gen: no distress, normal appearing HEENT: oral mucosa pink and moist, NCAT Cardio: Reg rate Chest: normal effort, normal rate of breathing Abd: soft, non-distended Ext: no edema Psych: pleasant, normal affect Skin: intact Neuro: Alert and awake, follows commands, cranial nerves II through XII intact, intact insight and judgment, speech and language intact Strength 5 out of 5 in all 4 extremities Sensation intact light touch in all 4 extremities Musculoskeletal:  Tenderness over paraspinal muscles lumbar spine right greater than left Mild tenderness noted over the right SI joint Slump test negative bilaterally She has pain with spinal extension   MRI L-spine 1/17/2024IMPRESSION: 1. L4-L5 moderate facet arthropathy, which can be a cause of back pain. Narrowing of the lateral recesses at this level could affect the descending L5 nerve roots. 2. No spinal canal stenosis  or neural foraminal narrowing.        Assessment & Plan:    Chronic lower back pain bilateral but worse on the right without radiculopathy -No improvement with Tylenol, unable to take NSAIDs due to cardiac history -She has some tenderness over her right SI joint however do not think this is the main source of her pain at this time -Pain is primarily axial in her lower back -List of foods for pain provided at prior visit -ORT low -TENS unit, nexwave device ordered prior visit -Continue Norco 7.5 twice daily as needed, #60 ordered at prior visit, advised to call when she needs a refill -Continue to monitor pill counts, PDMP, random UDS -Will start Cymbalta 20 mg daily, she would like to start at a low dose -Continue physical therapy although may consider discontinuing if no benefit noted in the next few sessions -Pain agreement completed prior visit -Continue Flexeril 10 mg up to 3 times daily as needed, however advised to continue using sparingly -Discussed facet joint injections L3-4, L4-5, L5-S1, consider referring again to Dr. Wynn Banker if no improvement with physical -Check UDS today, pill counts consistent     Chronic anxiety -No longer on Celexa, Cymbalta 20 mg daily ordered today and this may provide benefit to her anxiety also.

## 2022-10-29 NOTE — Telephone Encounter (Signed)
Continue current dose.   Thanks MJP

## 2022-10-30 ENCOUNTER — Ambulatory Visit (HOSPITAL_COMMUNITY)
Admission: RE | Admit: 2022-10-30 | Discharge: 2022-10-30 | Disposition: A | Discharge status: Home / Self Care | Payer: 59 | Source: Ambulatory Visit | Attending: Physician Assistant | Admitting: Physician Assistant

## 2022-10-30 ENCOUNTER — Encounter (HOSPITAL_COMMUNITY): Payer: Self-pay | Admitting: Physician Assistant

## 2022-10-30 VITALS — BP 118/60 | HR 68 | Ht 65.0 in | Wt 207.0 lb

## 2022-10-30 DIAGNOSIS — I2510 Atherosclerotic heart disease of native coronary artery without angina pectoris: Secondary | ICD-10-CM | POA: Insufficient documentation

## 2022-10-30 DIAGNOSIS — I5032 Chronic diastolic (congestive) heart failure: Secondary | ICD-10-CM | POA: Insufficient documentation

## 2022-10-30 DIAGNOSIS — I080 Rheumatic disorders of both mitral and aortic valves: Secondary | ICD-10-CM | POA: Diagnosis not present

## 2022-10-30 DIAGNOSIS — R9431 Abnormal electrocardiogram [ECG] [EKG]: Secondary | ICD-10-CM | POA: Insufficient documentation

## 2022-10-30 DIAGNOSIS — Z951 Presence of aortocoronary bypass graft: Secondary | ICD-10-CM | POA: Diagnosis not present

## 2022-10-30 DIAGNOSIS — D6869 Other thrombophilia: Secondary | ICD-10-CM | POA: Diagnosis not present

## 2022-10-30 DIAGNOSIS — N183 Chronic kidney disease, stage 3 unspecified: Secondary | ICD-10-CM | POA: Diagnosis not present

## 2022-10-30 DIAGNOSIS — E1122 Type 2 diabetes mellitus with diabetic chronic kidney disease: Secondary | ICD-10-CM | POA: Diagnosis not present

## 2022-10-30 DIAGNOSIS — I130 Hypertensive heart and chronic kidney disease with heart failure and stage 1 through stage 4 chronic kidney disease, or unspecified chronic kidney disease: Secondary | ICD-10-CM | POA: Insufficient documentation

## 2022-10-30 DIAGNOSIS — G4733 Obstructive sleep apnea (adult) (pediatric): Secondary | ICD-10-CM | POA: Insufficient documentation

## 2022-10-30 DIAGNOSIS — I4819 Other persistent atrial fibrillation: Secondary | ICD-10-CM | POA: Diagnosis not present

## 2022-10-30 DIAGNOSIS — Z952 Presence of prosthetic heart valve: Secondary | ICD-10-CM | POA: Insufficient documentation

## 2022-10-30 DIAGNOSIS — E669 Obesity, unspecified: Secondary | ICD-10-CM | POA: Insufficient documentation

## 2022-10-30 DIAGNOSIS — I10 Essential (primary) hypertension: Secondary | ICD-10-CM | POA: Diagnosis present

## 2022-10-30 DIAGNOSIS — Z6834 Body mass index (BMI) 34.0-34.9, adult: Secondary | ICD-10-CM | POA: Diagnosis not present

## 2022-10-30 MED ORDER — AMIODARONE HCL 200 MG PO TABS: 200.0000 mg | ORAL_TABLET | Freq: Every day | ORAL | 3 refills | Status: DC

## 2022-10-30 NOTE — Progress Notes (Signed)
Primary Care Physician: Nche, [name], NP Primary Cardiologist: Elder Negus, MD Electrophysiologist: Regan Lemming, MD  Referring Physician: Dr Zada Girt Atleigh Marrese is a 62 y.o. 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 She was admitted to the hospital March 2024 with acute on chronic diastolic heart failure and rapid atrial fibrillation. She had previously failed amiodarone. She was seen by Dr Elberta Fortis and underwent afib ablation on 09/05/22. Patient is on warfarin for a CHADS2VASC score of 5.  On follow up today, patient reports that he tachypalpitations have become more frequent, up to 10 episodes per day. They last for several minutes at a time. There are no specific triggers that she can identify.   Today, she denies symptoms of chest pain, shortness of breath, orthopnea, PND, lower extremity edema, dizziness, presyncope, syncope, snoring, daytime somnolence, bleeding, or neurologic sequela. The patient is tolerating medications without difficulties and is otherwise without complaint today.    Atrial Fibrillation Risk Factors:  she does have symptoms or diagnosis of sleep apnea. she is compliant with CPAP therapy. she does have a history of rheumatic fever.   Atrial Fibrillation Management history:  Previous antiarrhythmic drugs: amiodarone Previous cardioversions: 04/02/22, 04/27/22 Previous ablations: 09/05/22 Anticoagulation history: warfarin   ROS- All systems are reviewed and negative except as per the HPI above.  Past Medical History:  Diagnosis Date   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   COVID-19    H/O mitral valve replacement with mechanical #25 mechanical SJM 01/01/2017) 07/17/2018   Hyperlipidemia    Hypertension     Pain in both lower extremities 07/23/2019   Paroxysmal atrial fibrillation (HCC) 06/29/2015   Rheumatic heart disease    MS/ AS   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 01/01/2017 01/01/2017    Current Outpatient Medications  Medication Sig Dispense Refill   Accu-Chek Softclix Lancets lancets Use as instructed 100 each 12   albuterol (VENTOLIN HFA) 108 (90 Base) MCG/ACT inhaler Inhale 2 puffs into the lungs every 6 (six) hours as needed for wheezing or shortness of breath.     aspirin EC 81 MG tablet Take 81 mg by mouth daily.      azelastine (ASTELIN) 0.1 % nasal spray Place 1 spray into both nostrils 2 (two) times daily. Use in each nostril as directed (Patient taking differently: Place 1 spray into both nostrils daily as needed for allergies. Use in each nostril as directed) 30 mL 12   Blood Glucose Monitoring Suppl (ACCU-CHEK GUIDE) w/Device KIT Check glucose every morning 1 kit 0   clonazePAM (KLONOPIN) 0.5 MG tablet Take 1.5 tablets (0.75 mg total) by mouth at bedtime. 45 tablet 2   cyclobenzaprine (FLEXERIL) 10 MG tablet Take 1 tablet (10 mg total) by mouth 3 (three) times daily as needed for muscle spasms. 30 tablet 1   diclofenac Sodium (VOLTAREN ARTHRITIS PAIN) 1 % GEL Apply 4 g topically 4 (four) times daily. (Patient taking differently: Apply 4 g topically 4 (four) times daily as needed (pain).) 150 g 5   diltiazem (CARDIZEM CD) 180 MG 24 hr capsule TAKE 1 CAPSULE BY MOUTH DAILY 90 capsule 2   DULoxetine (CYMBALTA) 20 MG capsule Take 1 capsule (20 mg total) by mouth daily. 30 capsule  3   empagliflozin (JARDIANCE) 10 MG TABS tablet TAKE 1 TABLET(10 MG) BY MOUTH DAILY BEFORE BREAKFAST 90 tablet 3   Ferrous Sulfate (IRON PO) Take 1 tablet by mouth daily. Blood builder     furosemide (LASIX) 20 MG tablet TAKE 2 TABLETS(40 MG) BY MOUTH TWICE DAILY 60 tablet 3   gabapentin (NEURONTIN) 100 MG capsule Take 1 capsule (100 mg total) by mouth at bedtime. 30 capsule 5    glucose blood (ACCU-CHEK GUIDE) test strip Use as instructed 100 each 12   HYDROcodone-acetaminophen (NORCO) 7.5-325 MG tablet Take 1 tablet by mouth every 12 (twelve) hours as needed for moderate pain. 60 tablet 0   ipratropium (ATROVENT) 0.03 % nasal spray Place 1 spray into both nostrils 2 (two) times daily as needed for rhinitis. 30 mL 0   isosorbide mononitrate (IMDUR) 60 MG 24 hr tablet Take 1 tablet (60 mg total) by mouth daily. 90 tablet 3   levalbuterol (XOPENEX HFA) 45 MCG/ACT inhaler Inhale 1-2 puffs into the lungs every 6 (six) hours as needed for shortness of breath. 1 each 12   levalbuterol (XOPENEX) 0.63 MG/3ML nebulizer solution Take 3 mLs (0.63 mg total) by nebulization every 4 (four) hours as needed for wheezing or shortness of breath. 3 mL 12   losartan (COZAAR) 50 MG tablet TAKE 1 TABLET(50 MG) BY MOUTH DAILY 90 tablet 3   metFORMIN (GLUCOPHAGE) 500 MG tablet TAKE 1 TABLET(500 MG) BY MOUTH TWICE DAILY WITH A MEAL 180 tablet 3   metoprolol succinate (TOPROL-XL) 25 MG 24 hr tablet TAKE 1 TABLET(25 MG) BY MOUTH DAILY WITH OR IMMEDIATELY FOLLOWING A MEAL 90 tablet 3   montelukast (SINGULAIR) 10 MG tablet Take 1 tablet (10 mg total) by mouth daily. 30 tablet 11   nitrofurantoin, macrocrystal-monohydrate, (MACROBID) 100 MG capsule Take 1 capsule (100 mg total) by mouth 2 (two) times daily. 6 capsule 0   nitroGLYCERIN (NITROSTAT) 0.4 MG SL tablet DISSOLVE 1 TABLET UNDER THE TONGUE EVERY 5 MINUTES FOR 3 DOSES AS NEEDED FOR CHEST PAIN 25 tablet 3   pantoprazole (PROTONIX) 40 MG tablet Take 1 tablet (40 mg total) by mouth 2 (two) times daily before a meal. (Patient taking differently: Take 40 mg by mouth daily.) 180 tablet 3   phenazopyridine (PYRIDIUM) 100 MG tablet Take 100 mg by mouth 3 (three) times daily.     potassium chloride SA (KLOR-CON M) 20 MEQ tablet Take 1 tablet (20 mEq total) by mouth 2 (two) times daily. (Patient taking differently: Take 40 mEq by mouth daily.) 180 tablet 3    rosuvastatin (CRESTOR) 40 MG tablet Take 1 tablet (40 mg total) by mouth daily. 90 tablet 1   spironolactone (ALDACTONE) 50 MG tablet Take 1 tablet (50 mg total) by mouth daily. 90 tablet 3   tiotropium (SPIRIVA HANDIHALER) 18 MCG inhalation capsule Place 18 mcg into inhaler and inhale daily.     Tiotropium Bromide-Olodaterol (STIOLTO RESPIMAT) 2.5-2.5 MCG/ACT AERS Inhale 2 puffs into the lungs daily. 4 g 6   warfarin (COUMADIN) 5 MG tablet Take 7.5 mg on Monday and Wed and 5 mg all other days (Patient taking differently: Take 5 mg by mouth See admin instructions. Take Thursday, Friday, Saturday and Sunday at bedtime) 90 tablet 3   warfarin (COUMADIN) 7.5 MG tablet Take 7.5 mg on Monday and Wednesday and 5 mg all other days (Patient taking differently: Take 7.5 mg by mouth See admin instructions. Take 7.5 mg on Monday, Tuesday and Wednesday at bedtime) 90  tablet 3   amiodarone (PACERONE) 200 MG tablet Take by mouth. (Patient not taking: Reported on 10/30/2022)     No current facility-administered medications for this encounter.    Physical Exam: BP 118/60   Pulse 68   Ht 5\' 5"  (1.651 m)   Wt 93.9 kg   LMP 06/20/2015   BMI 34.45 kg/m   GEN: Well nourished, well developed in no acute distress NECK: No JVD; No carotid bruits CARDIAC: Regular rate and rhythm, no murmurs, rubs, gallops, mech AV  RESPIRATORY:  Clear to auscultation without rales, wheezing or rhonchi  ABDOMEN: Soft, non-tender, non-distended EXTREMITIES:  No edema; No deformity    Wt Readings from Last 3 Encounters:  10/30/22 93.9 kg  10/29/22 93.4 kg  10/28/22 92.5 kg     EKG today demonstrates  SR, slow R wave prog Vent. rate 68 BPM PR interval 184 ms QRS duration 116 ms QT/QTcB 426/452 ms  Echo 04/05/22 demonstrated  Mildly depressed LV systolic function with EF 53%. Left ventricle cavity  is normal in size. Normal global wall motion. Calculated EF 53%.  Mechanical trileaflet aortic valve.  No aortic valve  regurgitation. AVA  (VTI) measures 1.4 cm^2. AV Mean Grad measures 20.6 mmHg. AV Pk Vel  measures 3.39 m/s.  Mechanical mitral valve.  No mitral valve regurgitation. E-wave dominant  mitral inflow.  Structurally normal tricuspid valve.  Mild tricuspid regurgitation. RVSP  measures 35 mmHg.  IVC is dilated with respiratory variation.  Normally functioning prosthetic valves.    CHA2DS2-VASc Score = 5  The patient's score is based upon: CHF History: 1 HTN History: 1 Diabetes History: 1 Stroke History: 0 Vascular Disease History: 1 Age Score: 0 Gender Score: 1       ASSESSMENT AND PLAN: Persistent Atrial Fibrillation (ICD10:  I48.19) The patient's CHA2DS2-VASc score is 5, indicating a 7.2% annual risk of stroke.   S/p afib ablation 09/05/22 She has been having more frequent tachypalpitations. We discussed rhythm control options today. She is still in the blanking period post ablation and not unusual to have breakthrough afib during this time. Will resume amiodarone 200 mg daily for now. Anticipate this will be short term post ablation.  Continue Toprol 25 mg daily Continue warfarin  Secondary Hypercoagulable State (ICD10:  D68.69) The patient is at significant risk for stroke/thromboembolism based upon her CHA2DS2-VASc Score of 5.  Continue Warfarin (Coumadin).   CAD S/p CABG 2020 No anginal symptoms  HTN Stable on current regimen  Valvular heart disease Rheumatic S/p mitral and aortic valve replacements 2018 Continue warfarin  Obesity Body mass index is 34.45 kg/m.  Encouraged lifestyle modification  Chronic HFpEF GDMT per primary cardiology team Fluid status appears stable  OSA  Encouraged nightly CPAP    Follow up with Dr Rosemary Holms and Dr Elberta Fortis as scheduled.     Jorja Loa PA-C Af[facility] Clinic [facility] [address] [place], [place] [address] [phone]

## 2022-10-30 NOTE — Patient Instructions (Addendum)
Start Amiodarone '200mg'$  once a day

## 2022-10-31 ENCOUNTER — Encounter: Payer: Self-pay | Admitting: Cardiology

## 2022-10-31 DIAGNOSIS — G4733 Obstructive sleep apnea (adult) (pediatric): Secondary | ICD-10-CM | POA: Diagnosis not present

## 2022-10-31 LAB — URINE CULTURE
MICRO NUMBER:: 15439522
SPECIMEN QUALITY:: ADEQUATE

## 2022-10-31 MED ORDER — SULFAMETHOXAZOLE-TRIMETHOPRIM 800-160 MG PO TABS
1.0000 | ORAL_TABLET | Freq: Two times a day (BID) | ORAL | 0 refills | Status: AC
Start: 2022-10-31 — End: ?

## 2022-10-31 NOTE — Progress Notes (Signed)
She will need in office INR checked with heart care clinic in next 2 weeks.  Thanks MJP

## 2022-10-31 NOTE — Addendum Note (Signed)
Addended by: [name] L on: 10/31/2022 10:07 AM   Modules accepted: Orders

## 2022-11-01 ENCOUNTER — Telehealth: Payer: Self-pay

## 2022-11-01 DIAGNOSIS — M6281 Muscle weakness (generalized): Secondary | ICD-10-CM | POA: Diagnosis not present

## 2022-11-01 DIAGNOSIS — M545 Low back pain, unspecified: Secondary | ICD-10-CM | POA: Diagnosis not present

## 2022-11-01 NOTE — Telephone Encounter (Signed)
Spoke with pt and she states her INR was rechecked and it is now 3.3

## 2022-11-01 NOTE — Progress Notes (Signed)
Staff, please make arrangements for INR follow up with [facility]  Thanks MJP

## 2022-11-01 NOTE — Progress Notes (Signed)
Can you contact patient? Please and thank you.

## 2022-11-01 NOTE — Telephone Encounter (Signed)
Called patient, Left voicemail. Okay to take Bactrim but will need INR check on 9/16 or 9/17. I will not be in the office, but she can get INR. Her future INRs will need to be scheduled with [facility] clinic.  Thanks MJP

## 2022-11-01 NOTE — Telephone Encounter (Signed)
From patient.

## 2022-11-04 ENCOUNTER — Ambulatory Visit
Admission: RE | Admit: 2022-11-04 | Discharge: 2022-11-04 | Disposition: A | Discharge status: Home / Self Care | Payer: 59 | Source: Ambulatory Visit | Attending: Nurse Practitioner | Admitting: Nurse Practitioner

## 2022-11-04 DIAGNOSIS — Z1231 Encounter for screening mammogram for malignant neoplasm of breast: Secondary | ICD-10-CM

## 2022-11-05 ENCOUNTER — Encounter: Payer: Self-pay | Admitting: Nurse Practitioner

## 2022-11-05 LAB — TOXASSURE SELECT,+ANTIDEPR,UR

## 2022-11-06 ENCOUNTER — Ambulatory Visit: Payer: 59 | Attending: Cardiovascular Disease | Admitting: *Deleted

## 2022-11-06 DIAGNOSIS — I480 Paroxysmal atrial fibrillation: Secondary | ICD-10-CM | POA: Diagnosis not present

## 2022-11-06 DIAGNOSIS — Z5181 Encounter for therapeutic drug level monitoring: Secondary | ICD-10-CM | POA: Diagnosis not present

## 2022-11-06 DIAGNOSIS — Z952 Presence of prosthetic heart valve: Secondary | ICD-10-CM

## 2022-11-06 LAB — POCT INR: INR: 2.4 (ref 2.0–3.0)

## 2022-11-06 NOTE — Patient Instructions (Addendum)
A full discussion of the nature of anticoagulants has been carried out.  A benefit risk analysis has been presented to the patient, so that they understand the justification for choosing anticoagulation at this time. The need for frequent and regular monitoring, precise dosage adjustment and compliance is stressed.  Side effects of potential bleeding are discussed.  The patient should avoid any OTC items containing aspirin or ibuprofen, and should avoid great swings in general diet.  Avoid alcohol consumption.  Call if any signs of abnormal bleeding.   Higher=Thinner Lower=Lower  Description   Since you have taking 7.5mg  for the last two days, take 5mg  today then start taking warfarin 5mg  daily except 7.5mg  on Monday, Wednesday, and Friday. Recheck in 1 week in [place]. Anticoagulation Clinic [phone]

## 2022-11-08 ENCOUNTER — Encounter: Payer: Self-pay | Admitting: Nurse Practitioner

## 2022-11-08 ENCOUNTER — Telehealth: Payer: Self-pay | Admitting: Nurse Practitioner

## 2022-11-08 NOTE — Telephone Encounter (Signed)
Prescription Request  11/08/2022  LOV: 10/28/2022  What is the name of the medication or equipment? clonazePAM (KLONOPIN) 0.5 MG tablet [[id number]]  Have you contacted your pharmacy to request a refill? Yes, they are closed, she is needing this sent to: [facility] at  [address] [place], [place] [address] [address]: [employer] corner of [address]  Her last pill is tonight.   Phone : [phone]     Patient notified that their request is being sent to the clinical staff for review and that they should receive a response within 2 business days.   Please advise at [facility] [phone]

## 2022-11-11 NOTE — Telephone Encounter (Signed)
Patient notified that Rx sent in.

## 2022-11-12 ENCOUNTER — Ambulatory Visit: Payer: 59 | Attending: Cardiology

## 2022-11-12 DIAGNOSIS — Z5181 Encounter for therapeutic drug level monitoring: Secondary | ICD-10-CM

## 2022-11-12 DIAGNOSIS — I480 Paroxysmal atrial fibrillation: Secondary | ICD-10-CM

## 2022-11-12 DIAGNOSIS — Z952 Presence of prosthetic heart valve: Secondary | ICD-10-CM

## 2022-11-12 LAB — POCT INR: INR: 2.5 (ref 2.0–3.0)

## 2022-11-12 NOTE — Patient Instructions (Signed)
Description   Take 7.5mg  today then continue taking warfarin 5mg  daily except 7.5mg  on Monday, Wednesday, and Friday.  Recheck in 1 week in [place].  Anticoagulation Clinic [phone]

## 2022-11-13 ENCOUNTER — Ambulatory Visit: Payer: 59 | Admitting: Cardiology

## 2022-11-13 ENCOUNTER — Telehealth: Payer: Self-pay | Admitting: Nurse Practitioner

## 2022-11-13 DIAGNOSIS — R319 Hematuria, unspecified: Secondary | ICD-10-CM | POA: Diagnosis not present

## 2022-11-13 DIAGNOSIS — R350 Frequency of micturition: Secondary | ICD-10-CM | POA: Diagnosis not present

## 2022-11-13 DIAGNOSIS — N390 Urinary tract infection, site not specified: Secondary | ICD-10-CM | POA: Diagnosis not present

## 2022-11-13 DIAGNOSIS — R300 Dysuria: Secondary | ICD-10-CM | POA: Diagnosis not present

## 2022-11-13 LAB — POCT INR: INR: 2.5 (ref 2.0–3.0)

## 2022-11-13 NOTE — Telephone Encounter (Signed)
[place] [phone]  Pt feels she is getting reoccurring UTI due to side effect of Jardiance. She has an appt tomorrow but wanted me to put this message in anyway.  Her time is 40 min but I changed it to 20 min she is your last appt

## 2022-11-14 ENCOUNTER — Telehealth: Payer: Self-pay | Admitting: Cardiology

## 2022-11-14 ENCOUNTER — Ambulatory Visit: Payer: 59 | Admitting: Nurse Practitioner

## 2022-11-14 NOTE — Telephone Encounter (Signed)
Called pt and made her aware Cefdinir is okay to take with Warfarin and should not affect her INR. Pt has scheduled appt in [place] on Tuesday to have INR checked. Pt verbalized understanding.

## 2022-11-14 NOTE — Telephone Encounter (Signed)
Patient called to report she has been put on antibiotics (Cefdinir) 300 mg.  Patient stated she will be taking one tablet, twice daily for 10 days.  Patient noted her INR was 2.8.  Patient wants call back to confirm.

## 2022-11-19 ENCOUNTER — Ambulatory Visit: Payer: 59 | Admitting: Cardiology

## 2022-11-19 ENCOUNTER — Ambulatory Visit: Payer: 59 | Attending: Cardiology

## 2022-11-19 DIAGNOSIS — Z5181 Encounter for therapeutic drug level monitoring: Secondary | ICD-10-CM | POA: Diagnosis not present

## 2022-11-19 DIAGNOSIS — I480 Paroxysmal atrial fibrillation: Secondary | ICD-10-CM | POA: Diagnosis not present

## 2022-11-19 DIAGNOSIS — Z952 Presence of prosthetic heart valve: Secondary | ICD-10-CM

## 2022-11-19 LAB — POCT INR: INR: 3.3 — AB (ref 2.0–3.0)

## 2022-11-19 NOTE — Patient Instructions (Signed)
Description   Continue taking warfarin 5mg  daily except 7.5mg  on Monday, Wednesday, and Friday.  Recheck in 2 weeks in [place].  Anticoagulation Clinic [phone]

## 2022-11-20 NOTE — Progress Notes (Signed)
Cardiology Office Note:  .   Date:  11/21/2022  ID:  Kimberly Hobbs, DOB [birth year], MRN [id number] PCP: Anne Ng, NP  [facility] Providers Cardiologist:  Elder Negus, MD Electrophysiologist:  [name], MD  Sleep Medicine:  Armanda Magic, MD    History of Present Illness: .   Kimberly Hobbs is a 62 y.o. 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  2018 CABG x 2 LIMA > LAD, SVG-pPDA 07/21/2018 right left heart cath patent grafts, moderate to severe native RCA disease 04/02/2022 DCCV 04/05/2022 echo EF 53%, mild MR 04/27/2022 DCCV 09/05/2022 A-fib ablation  Previously followed with [facility] and Dr. Elberta Fortis for EP.  Most recently evaluated in the atrial fibrillation clinic on 10/30/2022 following her ablation, she continued have episodes of heart racing throughout the day but this was felt to be not unusual post ablation given the time period.  Her amiodarone was restarted at 200 mg daily.  She presents today to establish general [facility] care with [facility].  She would prefer a female cardiology physician, which is why she made the request to transfer from [place] [facility], we discussed that there are only female physicians down here as well as myself as a Publishing rights manager.  She would very much like to be with a female cardiologist so we will send her to establish with Dr. Cristal Deer in [place].  She has been doing well from a cardiac perspective.  She does endorse recurrent UTIs, she says she has had approximately 4 since she was started on Jardiance.  She continues to notice palpitations however it does feel like they have decreased somewhat.  She will follow-up with the EP on November 18. She denies chest pain,  dyspnea, pnd, orthopnea, n, v, dizziness, syncope, edema, weight gain, or early satiety.    ROS: Review of Systems  Cardiovascular:  Positive for palpitations.  Genitourinary:        Frequent UTIs     Studies Reviewed: .        Cardiac Studies & Procedures   CARDIAC CATHETERIZATION  CARDIAC CATHETERIZATION 08/18/2018  Narrative RCA: Prox 60% stenosis, mid 30-40% stenoses. TIMI III flow in prox-mid RCA. 100% distal occlusion SVG-RCA: Patent. Ostial 40%. Distal RCA moderate diffuse disease.  Guide catheter angiography provided superior images. There is TIMI III flow in SVG which fills RCA all the way to the ostium. Even is she were to have FFR positive lesion in the graft, I do not think the benefits of a stent would outweigh the risk of losing the graft and the entire RCA territory circulation in a borderline lesion. Also, the prox RCA lesion is well bypassed by the SVG graft. Thus, I decided not to perform FFR/PCI to either of these lesions. Continue medical management.  Elder Negus, MD [facility] Pager: [phone] Office: [phone] If no answer Cell [phone]  Findings Coronary Findings Diagnostic  Dominance: Right  Right Coronary Artery Prox RCA lesion is 60% stenosed. Prowater was only used for guid catheter engagement. No intervention was performed. Mid RCA lesion is 40% stenosed. Dist RCA lesion is 40% stenosed.  Right Posterior Atrioventricular Artery RPAV lesion is 50% stenosed. RPAV lesion is 40% stenosed.  Saphenous Graft To Dist RCA SVG and is normal in caliber. The graft exhibits no  Cardiology Office Note:  .   Date:  11/21/2022  ID:  Kimberly Hobbs, DOB [birth year], MRN [id number] PCP: Anne Ng, NP  [facility] Providers Cardiologist:  Elder Negus, MD Electrophysiologist:  [name], MD  Sleep Medicine:  Armanda Magic, MD    History of Present Illness: .   Kimberly Hobbs is a 62 y.o. 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  2018 CABG x 2 LIMA > LAD, SVG-pPDA 07/21/2018 right left heart cath patent grafts, moderate to severe native RCA disease 04/02/2022 DCCV 04/05/2022 echo EF 53%, mild MR 04/27/2022 DCCV 09/05/2022 A-fib ablation  Previously followed with [facility] and Dr. Elberta Fortis for EP.  Most recently evaluated in the atrial fibrillation clinic on 10/30/2022 following her ablation, she continued have episodes of heart racing throughout the day but this was felt to be not unusual post ablation given the time period.  Her amiodarone was restarted at 200 mg daily.  She presents today to establish general cardiology care with [facility].  She would prefer a female cardiology physician, which is why she made the request to transfer from [place] [facility], we discussed that there are only female physicians down here as well as myself as a Publishing rights manager.  She would very much like to be with a female cardiologist so we will send her to establish with Dr. Cristal Deer in [place].  She has been doing well from a cardiac perspective.  She does endorse recurrent UTIs, she says she has had approximately 4 since she was started on Jardiance.  She continues to notice palpitations however it does feel like they have decreased somewhat.  She will follow-up with the EP on November 18. She denies chest pain,  dyspnea, pnd, orthopnea, n, v, dizziness, syncope, edema, weight gain, or early satiety.    ROS: Review of Systems  Cardiovascular:  Positive for palpitations.  Genitourinary:        Frequent UTIs     Studies Reviewed: .        Cardiac Studies & Procedures   CARDIAC CATHETERIZATION  CARDIAC CATHETERIZATION 08/18/2018  Narrative RCA: Prox 60% stenosis, mid 30-40% stenoses. TIMI III flow in prox-mid RCA. 100% distal occlusion SVG-RCA: Patent. Ostial 40%. Distal RCA moderate diffuse disease.  Guide catheter angiography provided superior images. There is TIMI III flow in SVG which fills RCA all the way to the ostium. Even is she were to have FFR positive lesion in the graft, I do not think the benefits of a stent would outweigh the risk of losing the graft and the entire RCA territory circulation in a borderline lesion. Also, the prox RCA lesion is well bypassed by the SVG graft. Thus, I decided not to perform FFR/PCI to either of these lesions. Continue medical management.  Elder Negus, MD [facility] Pager: [phone] Office: [phone] If no answer Cell [phone]  Findings Coronary Findings Diagnostic  Dominance: Right  Right Coronary Artery Prox RCA lesion is 60% stenosed. Prowater was only used for guid catheter engagement. No intervention was performed. Mid RCA lesion is 40% stenosed. Dist RCA lesion is 40% stenosed.  Right Posterior Atrioventricular Artery RPAV lesion is 50% stenosed. RPAV lesion is 40% stenosed.  Saphenous Graft To Dist RCA SVG and is normal in caliber. The graft exhibits no  disease. Moderate diffuse disease in small native RCA distal to the graft Origin to Prox Graft lesion is 40% stenosed.  Intervention  No interventions have been documented.   CARDIAC CATHETERIZATION  CARDIAC CATHETERIZATION 07/21/2018  Narrative LM: Normal LAD: 100% mid occlusion. Mild distal disease LIMA-LAD: Patent LCx: Normal RCA: Prox  70%-80% stenosis, mid 30-40% stenoses. TIMI III flow in prox-mid RCA. 100% distal occlusion SVG-RCA: Patent. Ostial 75% stenosis. Distal RCA moderate diffuse disease.  Patent grafts. Moderate to severe native RCA disease.  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  Lesions in SVG ostium and prox RCA were better appreciated on further review after the case. I discussed the findings with the patient. Given her elevated blood pressures and filling pressures, recommend aggressive medical management at this time. Added spironolactone. Will bring her back for elective PCI to prox RCA +/- SVG-RCA ostium through right radial access on June 9. Will check BMP, COVID test on 06/05  Resume warfarin today and hold June 7 and June 8.  Elder Negus, MD [facility] Pager: [phone] Office: [phone] If no answer Cell [phone]  Findings Coronary Findings Diagnostic  Dominance: Right  Left Main Vessel is normal in caliber. Vessel is angiographically normal.  Left Anterior Descending Mid LAD lesion is 100% stenosed. Mid LAD to Dist LAD lesion is 20% stenosed.  Left Circumflex Vessel is normal in caliber. Vessel is angiographically normal.  Right Coronary Artery Prox RCA lesion is 70% stenosed. Mid RCA lesion is 40% stenosed. Dist RCA lesion is 40% stenosed.  Right Posterior Atrioventricular Artery Post Atrio lesion is 40% stenosed. Post Atrio lesion is 40% stenosed.  LIMA Graft To Mid LAD graft was visualized by angiography and is normal in caliber. The graft exhibits no disease. Mild diffuse disease in LAD distal to the graft.  Saphenous Graft To Dist RCA SVG and is normal in caliber. The graft exhibits no disease. Moderate diffuse disease in small native RCA distal to the graft Origin to Prox Graft lesion is 80% stenosed.  Intervention  No interventions have  been documented.     ECHOCARDIOGRAM  PCV ECHOCARDIOGRAM COMPLETE 04/05/2022  Narrative Echocardiogram 04/05/2022: Mildly depressed LV systolic function with EF 53%. Left ventricle cavity is normal in size. Normal global wall motion. Calculated EF 53%. Mechanical trileaflet aortic valve.  No aortic valve regurgitation. AVA (VTI) measures 1.4 cm^2. AV Mean Grad measures 20.6 mmHg. AV Pk Vel measures 3.39 m/s. Mechanical mitral valve.  No mitral valve regurgitation. E-wave dominant mitral inflow. Structurally normal tricuspid valve.  Mild tricuspid regurgitation. RVSP measures 35 mmHg. IVC is dilated with respiratory variation. Normally functioning prosthetic valves.   TEE  ECHO TEE 07/21/2018  Narrative TRANSESOPHOGEAL ECHO REPORT    Patient Name:   Kimberly Hobbs Date of Exam: 07/21/2018 Medical Rec #:  [id number]     Height:       64.0 in Accession #:    [id number]    Weight:       230.0 lb Date of Birth:  [birth year]      BSA:          2.08 m Patient Age:    57 years      BP:           120/45 mmHg Patient Gender: F             HR:           66 bpm. Exam Location:  Cardiology Office Note:  .   Date:  11/21/2022  ID:  Kimberly Hobbs, DOB [birth year], MRN [id number] PCP: Anne Ng, NP  [facility] Providers Cardiologist:  Elder Negus, MD Electrophysiologist:  [name], MD  Sleep Medicine:  Armanda Magic, MD    History of Present Illness: .   Kimberly Hobbs is a 62 y.o. 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  2018 CABG x 2 LIMA > LAD, SVG-pPDA 07/21/2018 right left heart cath patent grafts, moderate to severe native RCA disease 04/02/2022 DCCV 04/05/2022 echo EF 53%, mild MR 04/27/2022 DCCV 09/05/2022 A-fib ablation  Previously followed with [facility] and Dr. Elberta Fortis for EP.  Most recently evaluated in the atrial fibrillation clinic on 10/30/2022 following her ablation, she continued have episodes of heart racing throughout the day but this was felt to be not unusual post ablation given the time period.  Her amiodarone was restarted at 200 mg daily.  She presents today to establish general [facility] care with [facility].  She would prefer a female cardiology physician, which is why she made the request to transfer from [place] [facility], we discussed that there are only female physicians down here as well as myself as a Publishing rights manager.  She would very much like to be with a female cardiologist so we will send her to establish with Dr. Cristal Deer in [place].  She has been doing well from a cardiac perspective.  She does endorse recurrent UTIs, she says she has had approximately 4 since she was started on Jardiance.  She continues to notice palpitations however it does feel like they have decreased somewhat.  She will follow-up with the EP on November 18. She denies chest pain,  dyspnea, pnd, orthopnea, n, v, dizziness, syncope, edema, weight gain, or early satiety.    ROS: Review of Systems  Cardiovascular:  Positive for palpitations.  Genitourinary:        Frequent UTIs     Studies Reviewed: .        Cardiac Studies & Procedures   CARDIAC CATHETERIZATION  CARDIAC CATHETERIZATION 08/18/2018  Narrative RCA: Prox 60% stenosis, mid 30-40% stenoses. TIMI III flow in prox-mid RCA. 100% distal occlusion SVG-RCA: Patent. Ostial 40%. Distal RCA moderate diffuse disease.  Guide catheter angiography provided superior images. There is TIMI III flow in SVG which fills RCA all the way to the ostium. Even is she were to have FFR positive lesion in the graft, I do not think the benefits of a stent would outweigh the risk of losing the graft and the entire RCA territory circulation in a borderline lesion. Also, the prox RCA lesion is well bypassed by the SVG graft. Thus, I decided not to perform FFR/PCI to either of these lesions. Continue medical management.  Elder Negus, MD [facility] Pager: [phone] Office: [phone] If no answer Cell [phone]  Findings Coronary Findings Diagnostic  Dominance: Right  Right Coronary Artery Prox RCA lesion is 60% stenosed. Prowater was only used for guid catheter engagement. No intervention was performed. Mid RCA lesion is 40% stenosed. Dist RCA lesion is 40% stenosed.  Right Posterior Atrioventricular Artery RPAV lesion is 50% stenosed. RPAV lesion is 40% stenosed.  Saphenous Graft To Dist RCA SVG and is normal in caliber. The graft exhibits no

## 2022-11-21 ENCOUNTER — Ambulatory Visit: Payer: 59 | Attending: Cardiology | Admitting: Cardiology

## 2022-11-21 ENCOUNTER — Encounter: Payer: Self-pay | Admitting: Cardiology

## 2022-11-21 VITALS — BP 98/56 | HR 70 | Ht 65.0 in | Wt 206.6 lb

## 2022-11-21 DIAGNOSIS — I25810 Atherosclerosis of coronary artery bypass graft(s) without angina pectoris: Secondary | ICD-10-CM | POA: Diagnosis not present

## 2022-11-21 DIAGNOSIS — I5030 Unspecified diastolic (congestive) heart failure: Secondary | ICD-10-CM | POA: Diagnosis not present

## 2022-11-21 DIAGNOSIS — Z79899 Other long term (current) drug therapy: Secondary | ICD-10-CM | POA: Diagnosis not present

## 2022-11-21 DIAGNOSIS — I4819 Other persistent atrial fibrillation: Secondary | ICD-10-CM

## 2022-11-21 DIAGNOSIS — D6869 Other thrombophilia: Secondary | ICD-10-CM

## 2022-11-21 DIAGNOSIS — Z952 Presence of prosthetic heart valve: Secondary | ICD-10-CM | POA: Diagnosis not present

## 2022-11-21 DIAGNOSIS — I480 Paroxysmal atrial fibrillation: Secondary | ICD-10-CM

## 2022-11-21 DIAGNOSIS — I10 Essential (primary) hypertension: Secondary | ICD-10-CM | POA: Diagnosis not present

## 2022-11-21 NOTE — Patient Instructions (Signed)
Medication Instructions:  Your physician has recommended you make the following change in your medication:  Stop Jardiance  *If you need a refill on your cardiac medications before your next appointment, please call your pharmacy*   Lab Work: NONE If you have labs (blood work) drawn today and your tests are completely normal, you will receive your results only by: MyChart Message (if you have MyChart) OR A paper copy in the mail If you have any lab test that is abnormal or we need to change your treatment, we will call you to review the results.   Testing/Procedures: NONE   Follow-Up: At [facility], you and your health needs are our priority.  As part of our continuing mission to provide you with exceptional heart care, we have created designated Provider Care Teams.  These Care Teams include your primary Cardiologist (physician) and Advanced Practice Providers (APPs -  Physician Assistants and Nurse Practitioners) who all work together to provide you with the care you need, when you need it.  We recommend signing up for the patient portal called "MyChart".  Sign up information is provided on this After Visit Summary.  MyChart is used to connect with patients for Virtual Visits (Telemedicine).  Patients are able to view lab/test results, encounter notes, upcoming appointments, etc.  Non-urgent messages can be sent to your provider as well.   To learn more about what you can do with MyChart, go to [email or web address].    Your next appointment:  Dr. Jodelle Red    Provider:     Other Instructions

## 2022-11-23 ENCOUNTER — Encounter: Payer: Self-pay | Admitting: Cardiology

## 2022-11-23 ENCOUNTER — Other Ambulatory Visit: Payer: Self-pay | Admitting: Cardiology

## 2022-11-23 DIAGNOSIS — Z952 Presence of prosthetic heart valve: Secondary | ICD-10-CM

## 2022-11-25 DIAGNOSIS — E119 Type 2 diabetes mellitus without complications: Secondary | ICD-10-CM | POA: Diagnosis not present

## 2022-11-25 DIAGNOSIS — H2513 Age-related nuclear cataract, bilateral: Secondary | ICD-10-CM | POA: Diagnosis not present

## 2022-11-25 MED ORDER — DILTIAZEM HCL ER COATED BEADS 180 MG PO CP24: ORAL_CAPSULE | ORAL | 3 refills | Status: DC

## 2022-11-29 DIAGNOSIS — Z7901 Long term (current) use of anticoagulants: Secondary | ICD-10-CM | POA: Diagnosis not present

## 2022-11-29 DIAGNOSIS — I480 Paroxysmal atrial fibrillation: Secondary | ICD-10-CM | POA: Diagnosis not present

## 2022-11-29 DIAGNOSIS — G4733 Obstructive sleep apnea (adult) (pediatric): Secondary | ICD-10-CM | POA: Diagnosis not present

## 2022-11-29 DIAGNOSIS — Z952 Presence of prosthetic heart valve: Secondary | ICD-10-CM | POA: Diagnosis not present

## 2022-11-30 DIAGNOSIS — G4733 Obstructive sleep apnea (adult) (pediatric): Secondary | ICD-10-CM | POA: Diagnosis not present

## 2022-12-03 ENCOUNTER — Ambulatory Visit: Payer: 59 | Attending: Cardiology

## 2022-12-03 DIAGNOSIS — I480 Paroxysmal atrial fibrillation: Secondary | ICD-10-CM

## 2022-12-03 DIAGNOSIS — Z5181 Encounter for therapeutic drug level monitoring: Secondary | ICD-10-CM | POA: Diagnosis not present

## 2022-12-03 DIAGNOSIS — Z952 Presence of prosthetic heart valve: Secondary | ICD-10-CM

## 2022-12-03 LAB — POCT INR: INR: 3.7 — AB (ref 2.0–3.0)

## 2022-12-03 NOTE — Patient Instructions (Signed)
Description   Only take 1/2 tablet (2.5mg ) today and then continue taking warfarin 5mg  daily except 7.5mg  on Monday, Wednesday, and Friday.  Recheck in 2 weeks in [place].  Anticoagulation Clinic [phone]

## 2022-12-04 ENCOUNTER — Encounter: Payer: Self-pay | Admitting: Cardiology

## 2022-12-04 DIAGNOSIS — G4733 Obstructive sleep apnea (adult) (pediatric): Secondary | ICD-10-CM | POA: Diagnosis not present

## 2022-12-09 ENCOUNTER — Encounter: Payer: Self-pay | Admitting: Nurse Practitioner

## 2022-12-13 DIAGNOSIS — G4733 Obstructive sleep apnea (adult) (pediatric): Secondary | ICD-10-CM | POA: Diagnosis not present

## 2022-12-18 ENCOUNTER — Telehealth: Payer: Self-pay | Admitting: Internal Medicine

## 2022-12-18 NOTE — Telephone Encounter (Signed)
Patient would like to know where to go to be fitted for mask  CPAP machine. States pressure I is too strong. Patient phone number is [phone].

## 2022-12-23 ENCOUNTER — Encounter: Payer: Self-pay | Admitting: Family Medicine

## 2022-12-23 ENCOUNTER — Ambulatory Visit (INDEPENDENT_AMBULATORY_CARE_PROVIDER_SITE_OTHER): Payer: 59 | Admitting: Family Medicine

## 2022-12-23 VITALS — BP 130/74 | HR 67 | Resp 18 | Ht 65.0 in | Wt 206.0 lb

## 2022-12-23 DIAGNOSIS — G2581 Restless legs syndrome: Secondary | ICD-10-CM | POA: Diagnosis not present

## 2022-12-23 DIAGNOSIS — N183 Chronic kidney disease, stage 3 unspecified: Secondary | ICD-10-CM | POA: Diagnosis not present

## 2022-12-23 DIAGNOSIS — I10 Essential (primary) hypertension: Secondary | ICD-10-CM

## 2022-12-23 DIAGNOSIS — Z7984 Long term (current) use of oral hypoglycemic drugs: Secondary | ICD-10-CM

## 2022-12-23 DIAGNOSIS — M51379 Other intervertebral disc degeneration, lumbosacral region without mention of lumbar back pain or lower extremity pain: Secondary | ICD-10-CM

## 2022-12-23 DIAGNOSIS — D500 Iron deficiency anemia secondary to blood loss (chronic): Secondary | ICD-10-CM

## 2022-12-23 DIAGNOSIS — Z7689 Persons encountering health services in other specified circumstances: Secondary | ICD-10-CM | POA: Diagnosis not present

## 2022-12-23 DIAGNOSIS — E1165 Type 2 diabetes mellitus with hyperglycemia: Secondary | ICD-10-CM

## 2022-12-23 DIAGNOSIS — F411 Generalized anxiety disorder: Secondary | ICD-10-CM

## 2022-12-23 DIAGNOSIS — E1122 Type 2 diabetes mellitus with diabetic chronic kidney disease: Secondary | ICD-10-CM | POA: Diagnosis not present

## 2022-12-23 DIAGNOSIS — G4733 Obstructive sleep apnea (adult) (pediatric): Secondary | ICD-10-CM

## 2022-12-23 MED ORDER — LOSARTAN POTASSIUM 50 MG PO TABS
50.0000 mg | ORAL_TABLET | Freq: Every day | ORAL | 3 refills | Status: DC
Start: 2022-12-23 — End: 2023-01-15

## 2022-12-23 MED ORDER — CLONAZEPAM 0.5 MG PO TABS
0.7500 mg | ORAL_TABLET | Freq: Every day | ORAL | 0 refills | Status: DC
Start: 2022-12-23 — End: 2023-01-06

## 2022-12-23 MED ORDER — METFORMIN HCL 500 MG PO TABS: 500.0000 mg | ORAL_TABLET | Freq: Two times a day (BID) | ORAL | 3 refills | Status: AC

## 2022-12-23 MED ORDER — GABAPENTIN 100 MG PO CAPS
100.0000 mg | ORAL_CAPSULE | Freq: Every day | ORAL | 5 refills | Status: DC
Start: 2022-12-23 — End: 2023-03-20

## 2022-12-23 MED ORDER — CLONAZEPAM 0.5 MG PO TABS: 0.7500 mg | ORAL_TABLET | Freq: Every day | ORAL | 2 refills | Status: DC

## 2022-12-23 NOTE — Assessment & Plan Note (Signed)
Continue follow-up with pain management for refills of pain medication.

## 2022-12-23 NOTE — Patient Instructions (Signed)
I have sent a referral for you, but if you would like to call them first I have heard good things and people about [facility].  You can also look on the [employer] website on their find a therapist tab!  We will continue to work with them and with the medication side to eventually work towards switching from Klonopin to an equally effective but safer long-term option :)

## 2022-12-23 NOTE — Telephone Encounter (Signed)
Atc pt no answer, lvmm for pt to call back the office

## 2022-12-23 NOTE — Assessment & Plan Note (Signed)
Recently developed this year, possibly due to neuropathy vs radiculopathy vs anemia.  Previous provider started gabapentin 100 mg daily at bedtime.  Refill sent, likely will titrate up eventually to also improve sleep.

## 2022-12-23 NOTE — Progress Notes (Signed)
New Patient Office Visit  Subjective    Patient ID: Marisha Renier, female    DOB: 1960/04/15  Age: 62 y.o. MRN: [id number]  CC:  Chief Complaint  Patient presents with   Establish Care    HPI Cary Wilford presents to establish care. Past medical history includes essential hypertension, pulmonary hypertension, atrial fibrillation, CAD s/p CABG, COPD, OSA with CPAP nightly, type 2 diabetes, CKD stage III, RLS, GAD, chronic low back pain and degenerative disc disease.  She sees cardiology for management of CAD, atrial fibrillation, pulmonary hypertension.  She sees pain management for chronic low back pain, they prescribed hydrocodone-acetaminophen.  She is also established with hematology for management of Coumadin.  She uses CPAP nightly for OSA.  She struggles with sleep induction and has for many years, she has been taking Klonopin since around the year 2000 nightly for sleep induction.  She has also previously tried trazodone but had strange side effects like sleepwalking.  She would like to avoid medications like Ambien and Lunesta as well.  She had a sleep study most recently most recently on 09/24/2022.  Outpatient Encounter Medications as of 12/23/2022  Medication Sig   DULoxetine (CYMBALTA) 20 MG capsule Take 20 mg by mouth daily.   traZODone (DESYREL) 50 MG tablet Take 50 mg by mouth at bedtime.   Accu-Chek Softclix Lancets lancets Use as instructed (Patient taking differently: 1 each by Other route See admin instructions. Use as instructed)   amiodarone (PACERONE) 200 MG tablet Take 1 tablet (200 mg total) by mouth daily. (Patient taking differently: Take 200 mg by mouth See admin instructions. Take 1 tab daily, but take 1 tab bid if AFIB is present)   aspirin EC 81 MG tablet Take 81 mg by mouth daily.    Blood Glucose Monitoring Suppl (ACCU-CHEK GUIDE) w/Device KIT Check glucose every morning (Patient taking differently: 1 each by Other route See admin instructions. Check  glucose every morning)   clonazePAM (KLONOPIN) 0.5 MG tablet Take 1.5 tablets (0.75 mg total) by mouth at bedtime.   cyclobenzaprine (FLEXERIL) 10 MG tablet Take 1 tablet (10 mg total) by mouth 3 (three) times daily as needed for muscle spasms. (Patient taking differently: Take 10 mg by mouth at bedtime as needed for muscle spasms.)   diclofenac Sodium (VOLTAREN ARTHRITIS PAIN) 1 % GEL Apply 4 g topically 4 (four) times daily. (Patient taking differently: Apply 4 g topically 4 (four) times daily as needed (pain).)   diltiazem (CARDIZEM CD) 180 MG 24 hr capsule TAKE 1 CAPSULE BY MOUTH DAILY   furosemide (LASIX) 20 MG tablet TAKE 2 TABLETS(40 MG) BY MOUTH TWICE DAILY (Patient taking differently: Take 40 mg by mouth 2 (two) times daily.)   gabapentin (NEURONTIN) 100 MG capsule Take 1 capsule (100 mg total) by mouth at bedtime.   glucose blood (ACCU-CHEK GUIDE) test strip Use as instructed (Patient taking differently: 1 each by Other route as needed for other (Glucose chesk). Use as instructed)   HYDROcodone-acetaminophen (NORCO) 7.5-325 MG tablet Take 1 tablet by mouth every 12 (twelve) hours as needed for moderate pain.   ipratropium (ATROVENT) 0.03 % nasal spray Place 1 spray into both nostrils 2 (two) times daily as needed for rhinitis.   isosorbide mononitrate (IMDUR) 60 MG 24 hr tablet Take 1 tablet (60 mg total) by mouth daily.   losartan (COZAAR) 50 MG tablet Take 1 tablet (50 mg total) by mouth daily.   metFORMIN (GLUCOPHAGE) 500 MG tablet Take 1 tablet (  500 mg total) by mouth 2 (two) times daily with a meal.   metoprolol succinate (TOPROL-XL) 25 MG 24 hr tablet TAKE 1 TABLET(25 MG) BY MOUTH DAILY WITH OR IMMEDIATELY FOLLOWING A MEAL (Patient taking differently: Take 25 mg by mouth daily.)   montelukast (SINGULAIR) 10 MG tablet Take 1 tablet (10 mg total) by mouth daily.   nitroGLYCERIN (NITROSTAT) 0.4 MG SL tablet DISSOLVE 1 TABLET UNDER THE TONGUE EVERY 5 MINUTES FOR 3 DOSES AS NEEDED FOR CHEST  PAIN (Patient taking differently: Place 0.4 mg under the tongue every 5 (five) minutes as needed for chest pain.)   OVER THE COUNTER MEDICATION Take 1 tablet by mouth daily. [employer] Iron blood builder   pantoprazole (PROTONIX) 40 MG tablet Take 1 tablet (40 mg total) by mouth 2 (two) times daily before a meal. (Patient taking differently: Take 40 mg by mouth daily.)   phenazopyridine (PYRIDIUM) 100 MG tablet Take 100 mg by mouth 3 (three) times daily.   potassium chloride SA (KLOR-CON M) 20 MEQ tablet Take 1 tablet (20 mEq total) by mouth 2 (two) times daily. (Patient taking differently: Take 40 mEq by mouth daily.)   rosuvastatin (CRESTOR) 40 MG tablet Take 1 tablet (40 mg total) by mouth daily.   spironolactone (ALDACTONE) 50 MG tablet Take 1 tablet (50 mg total) by mouth daily.   tiotropium (SPIRIVA HANDIHALER) 18 MCG inhalation capsule Place 18 mcg into inhaler and inhale daily.   Tiotropium Bromide-Olodaterol (STIOLTO RESPIMAT) 2.5-2.5 MCG/ACT AERS Inhale 2 puffs into the lungs daily.   warfarin (COUMADIN) 5 MG tablet Take 7.5 mg on Monday and Wed and 5 mg all other days (Patient taking differently: Take 5 mg by mouth See admin instructions. Take Thursday, Friday, Saturday and Sunday at bedtime)   warfarin (COUMADIN) 7.5 MG tablet Take 7.5 mg on Monday and Wednesday and 5 mg all other days (Patient taking differently: Take 7.5 mg by mouth See admin instructions. Take 7.5 mg on Monday, Tuesday and Wednesday at bedtime)   [DISCONTINUED] clonazePAM (KLONOPIN) 0.5 MG tablet Take 1.5 tablets (0.75 mg total) by mouth at bedtime.   [DISCONTINUED] clonazePAM (KLONOPIN) 0.5 MG tablet Take 1.5 tablets (0.75 mg total) by mouth at bedtime.   [DISCONTINUED] gabapentin (NEURONTIN) 100 MG capsule Take 1 capsule (100 mg total) by mouth at bedtime.   [DISCONTINUED] losartan (COZAAR) 50 MG tablet TAKE 1 TABLET(50 MG) BY MOUTH DAILY   [DISCONTINUED] metFORMIN (GLUCOPHAGE) 500 MG tablet TAKE 1 TABLET(500 MG) BY  MOUTH TWICE DAILY WITH A MEAL (Patient taking differently: Take 500 mg by mouth 2 (two) times daily with a meal.)   No facility-administered encounter medications on file as of 12/23/2022.    Past Medical History:  Diagnosis Date   Allergy ?   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 01/01/2017 01/01/2017    Past Surgical History:  Procedure Laterality Date   ATRIAL FIBRILLATION ABLATION N/A  09/05/2022   Procedure: ATRIAL FIBRILLATION ABLATION;  Surgeon: Regan Lemming, MD;  Location: [facility];  Service: Cardiovascular;  Laterality: N/A;   CARDIAC CATHETERIZATION     CARDIAC VALVE REPLACEMENT     CARDIOVERSION N/A 04/02/2022   Procedure: CARDIOVERSION;  Surgeon: Elder Negus, MD;  Location: [facility];  Service: Cardiovascular;  Laterality: N/A;   CARDIOVERSION N/A 04/27/2022   Procedure: CARDIOVERSION;  Surgeon: Elder Negus, MD;  Location: [facility];  Service: Cardiovascular;  Laterality: N/A;   CHOLECYSTECTOMY     CORONARY ARTERY BYPASS GRAFT     CORONARY/GRAFT ANGIOGRAPHY N/A 08/18/2018   Procedure: CORONARY/GRAFT ANGIOGRAPHY;  Surgeon: Elder Negus, MD;  Location: [facility];  Service: Cardiovascular;  Laterality: N/A;   LEG SURGERY     "metal plate in leg"   RIGHT HEART CATH AND CORONARY/GRAFT ANGIOGRAPHY N/A 07/21/2018   Procedure: RIGHT HEART CATH AND CORONARY/GRAFT ANGIOGRAPHY;  Surgeon: Elder Negus, MD;  Location: [facility];  Service: Cardiovascular;  Laterality: N/A;   TEE WITHOUT CARDIOVERSION N/A 07/27/2015   Procedure: TRANSESOPHAGEAL ECHOCARDIOGRAM (TEE);  Surgeon: Thurmon Fair, MD;  Location: [facility] ENDOSCOPY;  Service: Cardiovascular;  Laterality: N/A;   TEE WITHOUT CARDIOVERSION N/A 07/21/2018   Procedure: TRANSESOPHAGEAL ECHOCARDIOGRAM (TEE);  Surgeon: Elder Negus, MD;  Location: [facility] ENDOSCOPY;  Service: Cardiovascular;  Laterality: N/A;   TONSILLECTOMY AND ADENOIDECTOMY      Family History  Problem Relation Age of Onset   Cancer Mother    Hypertension Mother    Diabetes Mother    Hyperlipidemia Mother    Heart disease Mother    Cancer Father    Hypertension Father    Diabetes Father    Heart disease Father    Hypertension Sister    Schizophrenia Maternal Grandmother    Hypertension Brother    Hypertension Brother    Cancer Daughter    COPD Daughter    Breast cancer Neg Hx     Social History   Socioeconomic History   Marital status: Divorced    Spouse name: Not on file   Number of children: 4   Years of education: Not on file   Highest education level: GED or equivalent  Occupational History   Not on file  Tobacco Use   Smoking status: Former    Current packs/day: 0.00    Average packs/day: 1.6 packs/day for 50.0 years (80.0 ttl pk-yrs)    Types: Cigarettes    Start date: 12/23/1976    Quit date: 12/23/2016    Years since quitting: 6.0    Passive exposure: Past   Smokeless tobacco: Never   Tobacco comments:    Stoped in 2015  Vaping Use   Vaping status: Never Used  Substance and Sexual Activity   Alcohol use: Never   Drug use: Yes    Types: Hydrocodone   Sexual activity: Not Currently  Other Topics Concern   Not on file  Social History Narrative   ** Merged History Encounter **       Epworth Sleepiness Scale = 4 (as of 06/28/2015)   Social Determinants of Health   Financial Resource Strain: Low Risk  (10/14/2022)    Overall Financial Resource Strain (CARDIA)    Difficulty of Paying Living Expenses: Not hard at all  Food Insecurity: No Food Insecurity (10/14/2022)   Hunger Vital Sign    Worried About Running Out of Food in the Last Year: Never true    Ran Out  of Food in the Last Year: Never true  Transportation Needs: No Transportation Needs (10/14/2022)   PRAPARE - Administrator, Civil Service (Medical): No    Lack of Transportation (Non-Medical): No  Physical Activity: Inactive (10/14/2022)   Exercise Vital Sign    Days of Exercise per Week: 0 days    Minutes of Exercise per Session: 0 min  Stress: Stress Concern Present (10/14/2022)   [employer] of Occupational Health - Occupational Stress Questionnaire    Feeling of Stress : To some extent  Social Connections: Moderately Isolated (10/14/2022)   Social Connection and Isolation Panel [NHANES]    Frequency of Communication with Friends and Family: More than three times a week    Frequency of Social Gatherings with Friends and Family: Never    Attends Religious Services: Never    Database administrator or Organizations: No    Attends Banker Meetings: Never    Marital Status: Living with partner  Intimate Partner Violence: Not At Risk (10/14/2022)   Humiliation, Afraid, Rape, and Kick questionnaire    Fear of Current or Ex-Partner: No    Emotionally Abused: No    Physically Abused: No    Sexually Abused: No    Review of Systems  Constitutional:  Negative for chills and fever.  HENT:  Negative for congestion, ear pain and sore throat.   Eyes:  Negative for pain, discharge and redness.  Respiratory:  Negative for shortness of breath.   Cardiovascular:  Negative for chest pain and palpitations.  Gastrointestinal:  Negative for abdominal pain, blood in stool, constipation, diarrhea, melena, nausea and vomiting.  Genitourinary:  Negative for dysuria and urgency.  Musculoskeletal:  Positive for back pain.   Neurological:  Negative for dizziness and headaches.  Endo/Heme/Allergies:  Does not bruise/bleed easily.  Psychiatric/Behavioral:  The patient has insomnia.     Objective    BP 130/74 (BP Location: Left Arm, Patient Position: Sitting, Cuff Size: Normal)   Pulse 67   Resp 18   Ht 5\' 5"  (1.651 m)   Wt 206 lb (93.4 kg)   LMP 06/20/2015   SpO2 99%   BMI 34.28 kg/m   Physical Exam Constitutional:      General: She is not in acute distress.    Appearance: Normal appearance. She is not ill-appearing.  HENT:     Head: Normocephalic and atraumatic.     Right Ear: Tympanic membrane, ear canal and external ear normal.     Left Ear: Tympanic membrane, ear canal and external ear normal.     Nose: Nose normal. No congestion or rhinorrhea.     Mouth/Throat:     Mouth: Mucous membranes are moist.     Pharynx: Oropharynx is clear. No oropharyngeal exudate or posterior oropharyngeal erythema.  Eyes:     General:        Right eye: No discharge.        Left eye: No discharge.     Conjunctiva/sclera: Conjunctivae normal.     Pupils: Pupils are equal, round, and reactive to light.  Cardiovascular:     Rate and Rhythm: Normal rate and regular rhythm.     Heart sounds: No murmur heard.    No friction rub. No gallop.  Pulmonary:     Effort: Pulmonary effort is normal. No respiratory distress.     Breath sounds: Normal breath sounds. No wheezing, rhonchi or rales.  Skin:    General: Skin is warm and dry.  Neurological:  Mental Status: She is alert and oriented to person, place, and time.     Cranial Nerves: No cranial nerve deficit.     Deep Tendon Reflexes: Reflexes normal.      Assessment & Plan:  Encounter to establish care  GAD (generalized anxiety disorder) Assessment & Plan: Patient discusses significant anxiety and racing thoughts that interfere with her ability to fall asleep.  We discussed the risks of long-term benzodiazepine use such as dependence, potential withdrawal,  side effects like respiratory depression, falls, memory changes.  This is further increased with chronic use of hydrocodone as well as history of OSA.  Patient is agreeable to working towards decreasing need for Klonopin.  Referral made for [facility], she states that she would benefit from more frequent therapy sessions versus monthly psychiatry appointments.  In the meantime, 1 month refill of clonazepam 0.75 mg daily at bedtime sent to pharmacy, controlled substances contract updated.  Will not try trazodone again, patient preference to avoid medications like Ambien or Lunesta.  Given ongoing anxiety, she may benefit from an SSRI/SNRI or from a medication that will also help with anxiety and racing thoughts like mirtazapine or Seroquel.  In chart, she previously was taking Celexa 20 mg daily given chronic nature of insomnia, use of ramelteon may also be beneficial to reset circadian rhythm.  Titrating up on gabapentin may also be beneficial both for RLS and for sleep induction.  We can also discuss referral to psychiatry for medication management and possible GeneSight testing.  Orders: -     Ambulatory referral to Psychology -     clonazePAM; Take 1.5 tablets (0.75 mg total) by mouth at bedtime.  Dispense: 45 tablet; Refill: 0  Essential hypertension Assessment & Plan: BP goal <130/80.  Stable, at goal.  Continue losartan 50 mg daily, isosorbide mononitrate 60 mg daily, furosemide 40 mg twice daily, diltiazem 180 mg daily.  Losartan prescription filled by new PCP today, other prescriptions managed by cardiology.  Most recent CMP within normal limits with the exception of elevated creatinine and low GFR consistent with CKD.  Will continue to monitor.  Orders: -     Losartan Potassium; Take 1 tablet (50 mg total) by mouth daily.  Dispense: 90 tablet; Refill: 3 -     CBC with Differential/Platelet; Future -     Comprehensive metabolic panel; Future  Restless leg Assessment &  Plan: Recently developed this year, possibly due to neuropathy vs radiculopathy vs anemia.  Previous provider started gabapentin 100 mg daily at bedtime.  Refill sent, likely will titrate up eventually to also improve sleep.  Orders: -     Gabapentin; Take 1 capsule (100 mg total) by mouth at bedtime.  Dispense: 30 capsule; Refill: 5  Type 2 diabetes mellitus with hyperglycemia, without long-term current use of insulin (HCC) Assessment & Plan: Most recent A1c 5.6.  Continue metformin 500 mg twice daily.  Will continue to monitor.  Due for eye exam.  Orders: -     metFORMIN HCl; Take 1 tablet (500 mg total) by mouth 2 (two) times daily with a meal.  Dispense: 180 tablet; Refill: 3 -     Hemoglobin A1c; Future  Iron deficiency anemia due to chronic blood loss Assessment & Plan: Patient taking over-the-counter iron supplement.  Repeating iron levels and CBC today.  Patient denies abdominal pain, melena or hematochezia.  No history of bariatric surgery, no known history of celiac disease.  Colorectal cancer screening up-to-date with colonoscopy 02/05/2016.  FOBT  negative in office 10/03/2022.  May be due to decreased absorption with chronic PPI (pantoprazole) use vs chronic kidney disease.  Orders: -     Iron and TIBC; Future -     Ferritin; Future  OSA (obstructive sleep apnea) Assessment & Plan: Continue use of CPAP nightly.   CKD stage 3 due to type 2 diabetes mellitus (HCC) Assessment & Plan: Most recent creatinine 1.480, GFR 38.  Blood pressure and blood sugar well-controlled.  Will continue to monitor.   Degeneration of intervertebral disc of lumbosacral region, unspecified whether pain present Assessment & Plan: Continue follow-up with pain management for refills of pain medication.     Return in about 4 weeks (around 01/20/2023) for follow-up for sleep.   I spent 55 minutes on the day of the encounter to include pre-visit record review of previous primary care and specialty  visits and diagnostics; face-to-face time with the patient collecting history, providing education, and discussing management options; and post visit ordering of tests and prescriptions.  [name], PA

## 2022-12-23 NOTE — Assessment & Plan Note (Signed)
Continue use of CPAP nightly.

## 2022-12-23 NOTE — Assessment & Plan Note (Addendum)
Patient taking over-the-counter iron supplement.  Repeating iron levels and CBC today.  Patient denies abdominal pain, melena or hematochezia.  No history of bariatric surgery, no known history of celiac disease.  Colorectal cancer screening up-to-date with colonoscopy 02/05/2016.  FOBT negative in office 10/03/2022.  May be due to decreased absorption with chronic PPI (pantoprazole) use vs chronic kidney disease.

## 2022-12-23 NOTE — Assessment & Plan Note (Signed)
Most recent A1c 5.6.  Continue metformin 500 mg twice daily.  Will continue to monitor.  Due for eye exam.

## 2022-12-23 NOTE — Assessment & Plan Note (Signed)
Patient discusses significant anxiety and racing thoughts that interfere with her ability to fall asleep.  We discussed the risks of long-term benzodiazepine use such as dependence, potential withdrawal, side effects like respiratory depression, falls, memory changes.  This is further increased with chronic use of hydrocodone as well as history of OSA.  Patient is agreeable to working towards decreasing need for Klonopin.  Referral made for [facility], she states that she would benefit from more frequent therapy sessions versus monthly psychiatry appointments.  In the meantime, 1 month refill of clonazepam 0.75 mg daily at bedtime sent to pharmacy, controlled substances contract updated.  Will not try trazodone again, patient preference to avoid medications like Ambien or Lunesta.  Given ongoing anxiety, she may benefit from an SSRI/SNRI or from a medication that will also help with anxiety and racing thoughts like mirtazapine or Seroquel.  In chart, she previously was taking Celexa 20 mg daily given chronic nature of insomnia, use of ramelteon may also be beneficial to reset circadian rhythm.  Titrating up on gabapentin may also be beneficial both for RLS and for sleep induction.  We can also discuss referral to psychiatry for medication management and possible GeneSight testing.

## 2022-12-23 NOTE — Assessment & Plan Note (Signed)
Most recent creatinine 1.480, GFR 38.  Blood pressure and blood sugar well-controlled.  Will continue to monitor.

## 2022-12-23 NOTE — Assessment & Plan Note (Addendum)
BP goal <130/80.  Stable, at goal.  Continue losartan 50 mg daily, isosorbide mononitrate 60 mg daily, furosemide 40 mg twice daily, diltiazem 180 mg daily.  Losartan prescription filled by new PCP today, other prescriptions managed by cardiology.  Most recent CMP within normal limits with the exception of elevated creatinine and low GFR consistent with CKD.  Will continue to monitor.

## 2022-12-24 ENCOUNTER — Telehealth: Payer: Self-pay | Admitting: Family Medicine

## 2022-12-24 ENCOUNTER — Telehealth: Payer: Self-pay

## 2022-12-24 ENCOUNTER — Ambulatory Visit: Payer: 59 | Attending: Cardiology

## 2022-12-24 DIAGNOSIS — Z5181 Encounter for therapeutic drug level monitoring: Secondary | ICD-10-CM | POA: Diagnosis not present

## 2022-12-24 DIAGNOSIS — I480 Paroxysmal atrial fibrillation: Secondary | ICD-10-CM

## 2022-12-24 DIAGNOSIS — Z952 Presence of prosthetic heart valve: Secondary | ICD-10-CM

## 2022-12-24 LAB — CBC WITH DIFFERENTIAL/PLATELET
Basophils Absolute: 0.1 10*3/uL (ref 0.0–0.2)
Basos: 1 %
EOS (ABSOLUTE): 0.8 10*3/uL — ABNORMAL HIGH (ref 0.0–0.4)
Eos: 9 %
Hematocrit: 41.1 % (ref 34.0–46.6)
Hemoglobin: 13.1 g/dL (ref 11.1–15.9)
Immature Grans (Abs): 0 10*3/uL (ref 0.0–0.1)
Immature Granulocytes: 0 %
Lymphocytes Absolute: 2.1 10*3/uL (ref 0.7–3.1)
Lymphs: 26 %
MCH: 28.1 pg (ref 26.6–33.0)
MCHC: 31.9 g/dL (ref 31.5–35.7)
MCV: 88 fL (ref 79–97)
Monocytes Absolute: 0.6 10*3/uL (ref 0.1–0.9)
Monocytes: 8 %
Neutrophils Absolute: 4.6 10*3/uL (ref 1.4–7.0)
Neutrophils: 56 %
Platelets: 261 10*3/uL (ref 150–450)
RBC: 4.66 x10E6/uL (ref 3.77–5.28)
RDW: 17 % — ABNORMAL HIGH (ref 11.7–15.4)
WBC: 8.3 10*3/uL (ref 3.4–10.8)

## 2022-12-24 LAB — IRON AND TIBC
Iron Saturation: 20 % (ref 15–55)
Iron: 95 ug/dL (ref 27–139)
Total Iron Binding Capacity: 475 ug/dL — ABNORMAL HIGH (ref 250–450)
UIBC: 380 ug/dL — ABNORMAL HIGH (ref 118–369)

## 2022-12-24 LAB — COMPREHENSIVE METABOLIC PANEL
ALT: 30 [IU]/L (ref 0–32)
AST: 32 [IU]/L (ref 0–40)
Albumin: 4.8 g/dL (ref 3.9–4.9)
Alkaline Phosphatase: 136 [IU]/L — ABNORMAL HIGH (ref 44–121)
BUN/Creatinine Ratio: 13 (ref 12–28)
BUN: 21 mg/dL (ref 8–27)
Bilirubin Total: 0.4 mg/dL (ref 0.0–1.2)
CO2: 23 mmol/L (ref 20–29)
Calcium: 10 mg/dL (ref 8.7–10.3)
Chloride: 99 mmol/L (ref 96–106)
Creatinine, Ser: 1.64 mg/dL — ABNORMAL HIGH (ref 0.57–1.00)
Globulin, Total: 3.1 g/dL (ref 1.5–4.5)
Glucose: 123 mg/dL — ABNORMAL HIGH (ref 70–99)
Potassium: 4.9 mmol/L (ref 3.5–5.2)
Sodium: 139 mmol/L (ref 134–144)
Total Protein: 7.9 g/dL (ref 6.0–8.5)
eGFR: 35 mL/min/{1.73_m2} — ABNORMAL LOW (ref >59)

## 2022-12-24 LAB — POCT INR: INR: 4 — AB (ref 2.0–3.0)

## 2022-12-24 LAB — HEMOGLOBIN A1C
Est. average glucose Bld gHb Est-mCnc: 128 mg/dL
Hgb A1c MFr Bld: 6.1 % — ABNORMAL HIGH (ref 4.8–5.6)

## 2022-12-24 LAB — FERRITIN: Ferritin: 58 ng/mL (ref 15–150)

## 2022-12-24 MED ORDER — DAPAGLIFLOZIN PROPANEDIOL 5 MG PO TABS: 5.0000 mg | ORAL_TABLET | Freq: Every day | ORAL | 3 refills | Status: DC

## 2022-12-24 NOTE — Addendum Note (Signed)
Addended by: Saralyn Pilar on: 12/24/2022 12:24 PM   Modules accepted: Orders

## 2022-12-24 NOTE — Patient Instructions (Signed)
Description   HOLD today's dose and then continue taking warfarin 5mg  daily except 7.5mg  on Monday, Wednesday, and Friday.  Recheck in 2 weeks in [facility].  [facility] [phone]

## 2022-12-24 NOTE — Telephone Encounter (Signed)
Pt called to let Claris Gower know that she is fired and she no longer needs her as her provider.

## 2022-12-25 MED ORDER — CYCLOBENZAPRINE HCL 10 MG PO TABS: 10.0000 mg | ORAL_TABLET | Freq: Every evening | ORAL | 3 refills | Status: DC | PRN

## 2022-12-25 MED ORDER — HYDROCODONE-ACETAMINOPHEN 7.5-325 MG PO TABS: 1.0000 | ORAL_TABLET | Freq: Two times a day (BID) | ORAL | 0 refills | Status: DC | PRN

## 2022-12-25 NOTE — Telephone Encounter (Signed)
Mrs. Wellborn called back wanting know if you will be sending her refills?

## 2022-12-27 ENCOUNTER — Encounter: Payer: Self-pay | Admitting: Cardiology

## 2022-12-27 ENCOUNTER — Ambulatory Visit: Payer: 59 | Attending: Cardiology

## 2022-12-27 ENCOUNTER — Ambulatory Visit: Payer: 59 | Attending: Cardiology | Admitting: Cardiology

## 2022-12-27 VITALS — BP 96/60 | HR 66 | Ht 65.0 in | Wt 211.0 lb

## 2022-12-27 DIAGNOSIS — I25708 Atherosclerosis of coronary artery bypass graft(s), unspecified, with other forms of angina pectoris: Secondary | ICD-10-CM

## 2022-12-27 DIAGNOSIS — I4819 Other persistent atrial fibrillation: Secondary | ICD-10-CM | POA: Diagnosis not present

## 2022-12-27 DIAGNOSIS — D6869 Other thrombophilia: Secondary | ICD-10-CM

## 2022-12-27 DIAGNOSIS — I480 Paroxysmal atrial fibrillation: Secondary | ICD-10-CM

## 2022-12-27 DIAGNOSIS — Z952 Presence of prosthetic heart valve: Secondary | ICD-10-CM

## 2022-12-27 DIAGNOSIS — I10 Essential (primary) hypertension: Secondary | ICD-10-CM

## 2022-12-27 DIAGNOSIS — I493 Ventricular premature depolarization: Secondary | ICD-10-CM | POA: Diagnosis not present

## 2022-12-27 DIAGNOSIS — I272 Pulmonary hypertension, unspecified: Secondary | ICD-10-CM

## 2022-12-27 NOTE — Progress Notes (Unsigned)
Enrolled patient for a 14 day Zio XT  monitor to be mailed to patients home  °

## 2022-12-27 NOTE — Patient Instructions (Signed)
Medication Instructions:  Your physician recommends that you continue on your current medications as directed. Please refer to the Current Medication list given to you today.  *If you need a refill on your cardiac medications before your next appointment, please call your pharmacy*   Lab Work: None   Testing/Procedures: Christena Deem- Long Term Monitor Instructions  Your physician has requested you wear a ZIO patch monitor for 14 days.  This is a single patch monitor. [employer] supplies one patch monitor per enrollment. Additional stickers are not available. Please do not apply patch if you will be having a Nuclear Stress Test,  Echocardiogram, Cardiac CT, MRI, or Chest Xray during the period you would be wearing the  monitor. The patch cannot be worn during these tests. You cannot remove and re-apply the  ZIO XT patch monitor.  Your ZIO patch monitor will be mailed 3 day USPS to your address on file. It may take 3-5 days  to receive your monitor after you have been enrolled.  Once you have received your monitor, please review the enclosed instructions. Your monitor  has already been registered assigning a specific monitor serial # to you.  Billing and Patient Assistance Program Information  We have supplied [employer] with any of your insurance information on file for billing purposes. [employer] offers a sliding scale Patient Assistance Program for patients that do not have  insurance, or whose insurance does not completely cover the cost of the ZIO monitor.  You must apply for the Patient Assistance Program to qualify for this discounted rate.  To apply, please call [employer] at [phone], select option 4, select option 2, ask to apply for  Patient Assistance Program. Meredeth Ide will ask your household income, and how many people  are in your household. They will quote your out-of-pocket cost based on that information.  [employer] will also be able to set up a 21-month, interest-free payment plan if  needed.  Applying the monitor   Shave hair from upper left chest.  Hold abrader disc by orange tab. Rub abrader in 40 strokes over the upper left chest as  indicated in your monitor instructions.  Clean area with 4 enclosed alcohol pads. Let dry.  Apply patch as indicated in monitor instructions. Patch will be placed under collarbone on left  side of chest with arrow pointing upward.  Rub patch adhesive wings for 2 minutes. Remove white label marked "1". Remove the white  label marked "2". Rub patch adhesive wings for 2 additional minutes.  While looking in a mirror, press and release button in center of patch. A small green light will  flash 3-4 times. This will be your only indicator that the monitor has been turned on.  Do not shower for the first 24 hours. You may shower after the first 24 hours.  Press the button if you feel a symptom. You will hear a small click. Record Date, Time and  Symptom in the Patient Logbook.  When you are ready to remove the patch, follow instructions on the last 2 pages of Patient  Logbook. Stick patch monitor onto the last page of Patient Logbook.  Place Patient Logbook in the blue and white box. Use locking tab on box and tape box closed  securely. The blue and white box has prepaid postage on it. Please place it in the mailbox as  soon as possible. Your physician should have your test results approximately 7 days after the  monitor has been mailed back to [facility].  Call [facility] Customer Care at [phone] if you have questions regarding  your ZIO XT patch monitor. Call them immediately if you see an orange light blinking on your  monitor.  If your monitor falls off in less than 4 days, contact our Monitor department at [phone].  If your monitor becomes loose or falls off after 4 days call [employer] at [phone] for  suggestions on securing your monitor    Follow-Up: At [facility], you and your health needs are  our priority.  As part of our continuing mission to provide you with exceptional heart care, we have created designated Provider Care Teams.  These Care Teams include your primary Cardiologist (physician) and Advanced Practice Providers (APPs -  Physician Assistants and Nurse Practitioners) who all work together to provide you with the care you need, when you need it.   Your next appointment:   3 month(s)  Provider:   Thomasene Ripple, DO

## 2022-12-28 NOTE — Progress Notes (Signed)
Cardiology Office Note:    Date:  12/28/2022   ID:  Kimberly Hobbs, DOB 12-15-1960, MRN [id number]  PCP:  [name], PA  Cardiologist:  Thomasene Ripple, DO  Electrophysiologist:  Regan Lemming, MD   Referring MD: Anne Ng, NP   " I need a doctor who will not leave me to the NP"   History of Present Illness:    Kimberly Hobbs is a 62 y.o. 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   Here today for a follow up visit. This is my first visit with this patient.   She expresses a desire for continuity of care and to establish a relationship with a single provider. She has been seen in the AFib clinic and by various providers due to her location. She reports a history of misdiagnosis leading to a heart attack and subsequent valve replacements and bypass surgeries.  Since her ablation, she reports intermittent episodes of AFib, which she can identify. She reports these episodes as occurring sporadically, with an example of three episodes in the previous night. She has not worn a monitor since her ablation.  She also reports a history of low blood pressure, which she attributes to her numerous medications. She expresses a desire to reduce her medication load if possible.  Past Medical History:  Diagnosis Date   Allergy ?   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   COPD (chronic obstructive pulmonary disease) (HCC)    COVID-19    GERD (gastroesophageal reflux disease)    H/O mitral valve replacement with mechanical #25 mechanical SJM 01/01/2017) 07/17/2018   Hyperlipidemia    Hypertension    Pain in  both lower extremities 07/23/2019   Paroxysmal atrial fibrillation (HCC) 06/29/2015   Rheumatic heart disease    MS/ AS   Sleep apnea    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 01/01/2017 01/01/2017    Past Surgical History:  Procedure Laterality Date   ATRIAL FIBRILLATION ABLATION N/A 09/05/2022   Procedure: ATRIAL FIBRILLATION ABLATION;  Surgeon: Regan Lemming, MD;  Location: [facility];  Service: Cardiovascular;  Laterality: N/A;   CARDIAC CATHETERIZATION     CARDIAC VALVE REPLACEMENT     CARDIOVERSION N/A 04/02/2022   Procedure: CARDIOVERSION;  Surgeon: Elder Negus, MD;  Location: [facility];  Service: Cardiovascular;  Laterality: N/A;   CARDIOVERSION N/A 04/27/2022   Procedure: CARDIOVERSION;  Surgeon: Elder Negus, MD;  Location: [facility];  Service: Cardiovascular;  Laterality: N/A;   CHOLECYSTECTOMY     CORONARY ARTERY BYPASS GRAFT     CORONARY/GRAFT ANGIOGRAPHY N/A 08/18/2018   Procedure: CORONARY/GRAFT ANGIOGRAPHY;  Surgeon: Elder Negus, MD;  Location: [facility];  Service: Cardiovascular;  Laterality: N/A;   LEG SURGERY     "metal plate in leg"   RIGHT HEART CATH AND CORONARY/GRAFT ANGIOGRAPHY N/A 07/21/2018   Procedure: RIGHT HEART CATH AND CORONARY/GRAFT ANGIOGRAPHY;  Surgeon: Elder Negus, MD;  Location: [facility];  Service: Cardiovascular;  Laterality: N/A;   TEE WITHOUT CARDIOVERSION N/A 07/27/2015   Procedure: TRANSESOPHAGEAL ECHOCARDIOGRAM (TEE);  Surgeon:  Thurmon Fair, MD;  Location: [facility];  Service: Cardiovascular;  Laterality: N/A;   TEE WITHOUT CARDIOVERSION N/A 07/21/2018   Procedure: TRANSESOPHAGEAL ECHOCARDIOGRAM (TEE);  Surgeon: Elder Negus, MD;  Location: [facility];  Service: Cardiovascular;  Laterality: N/A;   TONSILLECTOMY AND ADENOIDECTOMY      Current Medications: Current Meds  Medication Sig   Accu-Chek Softclix Lancets lancets Use as  instructed (Patient taking differently: 1 each by Other route See admin instructions. Use as instructed)   amiodarone (PACERONE) 200 MG tablet Take 1 tablet (200 mg total) by mouth daily. (Patient taking differently: Take 200 mg by mouth See admin instructions. Take 1 tab daily, but take 1 tab bid if AFIB is present)   aspirin EC 81 MG tablet Take 81 mg by mouth daily.    Blood Glucose Monitoring Suppl (ACCU-CHEK GUIDE) w/Device KIT Check glucose every morning (Patient taking differently: 1 each by Other route See admin instructions. Check glucose every morning)   clonazePAM (KLONOPIN) 0.5 MG tablet Take 1.5 tablets (0.75 mg total) by mouth at bedtime.   cyclobenzaprine (FLEXERIL) 10 MG tablet Take 1 tablet (10 mg total) by mouth at bedtime as needed for muscle spasms.   dapagliflozin propanediol (FARXIGA) 5 MG TABS tablet Take 1 tablet (5 mg total) by mouth daily.   diclofenac Sodium (VOLTAREN ARTHRITIS PAIN) 1 % GEL Apply 4 g topically 4 (four) times daily. (Patient taking differently: Apply 4 g topically 4 (four) times daily as needed (pain).)   diltiazem (CARDIZEM CD) 180 MG 24 hr capsule TAKE 1 CAPSULE BY MOUTH DAILY   DULoxetine (CYMBALTA) 20 MG capsule Take 20 mg by mouth daily.   furosemide (LASIX) 20 MG tablet TAKE 2 TABLETS(40 MG) BY MOUTH TWICE DAILY (Patient taking differently: Take 40 mg by mouth 2 (two) times daily.)   gabapentin (NEURONTIN) 100 MG capsule Take 1 capsule (100 mg total) by mouth at bedtime.   glucose blood (ACCU-CHEK GUIDE) test strip Use as instructed (Patient taking differently: 1 each by Other route as needed for other (Glucose chesk). Use as instructed)   HYDROcodone-acetaminophen (NORCO) 7.5-325 MG tablet Take 1 tablet by mouth every 12 (twelve) hours as needed for moderate pain (pain score 4-6).   ipratropium (ATROVENT) 0.03 % nasal spray Place 1 spray into both nostrils 2 (two) times daily as needed for rhinitis.   isosorbide mononitrate (IMDUR) 60 MG 24 hr tablet  Take 1 tablet (60 mg total) by mouth daily.   losartan (COZAAR) 50 MG tablet Take 1 tablet (50 mg total) by mouth daily.   metFORMIN (GLUCOPHAGE) 500 MG tablet Take 1 tablet (500 mg total) by mouth 2 (two) times daily with a meal.   metoprolol succinate (TOPROL-XL) 25 MG 24 hr tablet TAKE 1 TABLET(25 MG) BY MOUTH DAILY WITH OR IMMEDIATELY FOLLOWING A MEAL (Patient taking differently: Take 25 mg by mouth daily.)   montelukast (SINGULAIR) 10 MG tablet Take 1 tablet (10 mg total) by mouth daily.   nitroGLYCERIN (NITROSTAT) 0.4 MG SL tablet DISSOLVE 1 TABLET UNDER THE TONGUE EVERY 5 MINUTES FOR 3 DOSES AS NEEDED FOR CHEST PAIN (Patient taking differently: Place 0.4 mg under the tongue every 5 (five) minutes as needed for chest pain.)   OVER THE COUNTER MEDICATION Take 1 tablet by mouth daily. [employer] Iron blood builder   pantoprazole (PROTONIX) 40 MG tablet Take 1 tablet (40 mg total) by mouth 2 (two) times daily before a meal. (Patient taking differently: Take 40 mg by mouth daily.)  phenazopyridine (PYRIDIUM) 100 MG tablet Take 100 mg by mouth 3 (three) times daily.   potassium chloride SA (KLOR-CON M) 20 MEQ tablet Take 1 tablet (20 mEq total) by mouth 2 (two) times daily. (Patient taking differently: Take 40 mEq by mouth daily.)   rosuvastatin (CRESTOR) 40 MG tablet Take 1 tablet (40 mg total) by mouth daily.   spironolactone (ALDACTONE) 50 MG tablet Take 1 tablet (50 mg total) by mouth daily.   tiotropium (SPIRIVA HANDIHALER) 18 MCG inhalation capsule Place 18 mcg into inhaler and inhale daily.   Tiotropium Bromide-Olodaterol (STIOLTO RESPIMAT) 2.5-2.5 MCG/ACT AERS Inhale 2 puffs into the lungs daily.   traZODone (DESYREL) 50 MG tablet Take 50 mg by mouth at bedtime.   warfarin (COUMADIN) 5 MG tablet Take 7.5 mg on Monday and Wed and 5 mg all other days (Patient taking differently: Take 5 mg by mouth See admin instructions. Take Thursday, Friday, Saturday and Sunday at bedtime)   warfarin  (COUMADIN) 7.5 MG tablet Take 7.5 mg on Monday and Wednesday and 5 mg all other days (Patient taking differently: Take 7.5 mg by mouth See admin instructions. Take 7.5 mg on Monday, Tuesday and Wednesday at bedtime)     Allergies:   Iodinated contrast media and Penicillins   Social History   Socioeconomic History   Marital status: Divorced    Spouse name: Not on file   Number of children: 4   Years of education: Not on file   Highest education level: GED or equivalent  Occupational History   Not on file  Tobacco Use   Smoking status: Former    Current packs/day: 0.00    Average packs/day: 1.6 packs/day for 50.0 years (80.0 ttl pk-yrs)    Types: Cigarettes    Start date: 12/23/1976    Quit date: 12/23/2016    Years since quitting: 6.0    Passive exposure: Past   Smokeless tobacco: Never   Tobacco comments:    Stoped in 2015  Vaping Use   Vaping status: Never Used  Substance and Sexual Activity   Alcohol use: Never   Drug use: Yes    Types: Hydrocodone   Sexual activity: Not Currently  Other Topics Concern   Not on file  Social History Narrative   ** Merged History Encounter **       Epworth Sleepiness Scale = 4 (as of 06/28/2015)   Social Determinants of Health   Financial Resource Strain: Low Risk  (10/14/2022)   Overall Financial Resource Strain (CARDIA)    Difficulty of Paying Living Expenses: Not hard at all  Food Insecurity: No Food Insecurity (10/14/2022)   Hunger Vital Sign    Worried About Running Out of Food in the Last Year: Never true    Ran Out of Food in the Last Year: Never true  Transportation Needs: No Transportation Needs (10/14/2022)   PRAPARE - Administrator, Civil Service (Medical): No    Lack of Transportation (Non-Medical): No  Physical Activity: Inactive (10/14/2022)   Exercise Vital Sign    Days of Exercise per Week: 0 days    Minutes of Exercise per Session: 0 min  Stress: Stress Concern Present (10/14/2022)   [employer] of  Occupational Health - Occupational Stress Questionnaire    Feeling of Stress : To some extent  Social Connections: Moderately Isolated (10/14/2022)   Social Connection and Isolation Panel [NHANES]    Frequency of Communication with Friends and Family: More than three times a week  Frequency of Social Gatherings with Friends and Family: Never    Attends Religious Services: Never    Database administrator or Organizations: No    Attends Engineer, structural: Never    Marital Status: Living with partner     Family History: The patient's family history includes COPD in her daughter; Cancer in her daughter, father, and mother; Diabetes in her father and mother; Heart disease in her father and mother; Hyperlipidemia in her mother; Hypertension in her brother, brother, father, mother, and sister; Schizophrenia in her maternal grandmother. There is no history of Breast cancer.  ROS:   Review of Systems  Constitution: Negative for decreased appetite, fever and weight gain.  HENT: Negative for congestion, ear discharge, hoarse voice and sore throat.   Eyes: Negative for discharge, redness, vision loss in right eye and visual halos.  Cardiovascular: Negative for chest pain, dyspnea on exertion, leg swelling, orthopnea and palpitations.  Respiratory: Negative for cough, hemoptysis, shortness of breath and snoring.   Endocrine: Negative for heat intolerance and polyphagia.  Hematologic/Lymphatic: Negative for bleeding problem. Does not bruise/bleed easily.  Skin: Negative for flushing, nail changes, rash and suspicious lesions.  Musculoskeletal: Negative for arthritis, joint pain, muscle cramps, myalgias, neck pain and stiffness.  Gastrointestinal: Negative for abdominal pain, bowel incontinence, diarrhea and excessive appetite.  Genitourinary: Negative for decreased libido, genital sores and incomplete emptying.  Neurological: Negative for brief paralysis, focal weakness, headaches and loss  of balance.  Psychiatric/Behavioral: Negative for altered mental status, depression and suicidal ideas.  Allergic/Immunologic: Negative for HIV exposure and persistent infections.    EKGs/Labs/Other Studies Reviewed:    The following studies were reviewed today:   EKG: none today   Recent Labs: 03/07/2022: TSH 3.050 04/27/2022: Magnesium 2.5 12/23/2022: ALT 30; BUN 21; Creatinine, Ser 1.64; Hemoglobin 13.1; Platelets 261; Potassium 4.9; Sodium 139  Recent Lipid Panel    Component Value Date/Time   CHOL 144 10/01/2022 1151   TRIG 170.0 (H) 10/01/2022 1151   HDL 50.60 10/01/2022 1151   CHOLHDL 3 10/01/2022 1151   VLDL 34.0 10/01/2022 1151   LDLCALC 59 10/01/2022 1151    Physical Exam:    VS:  BP 96/60 (BP Location: Left Arm, Patient Position: Sitting, Cuff Size: Normal)   Pulse 66   Ht 5\' 5"  (1.651 m)   Wt 211 lb (95.7 kg)   LMP 06/20/2015   SpO2 96%   BMI 35.11 kg/m     Wt Readings from Last 3 Encounters:  12/27/22 211 lb (95.7 kg)  12/23/22 206 lb (93.4 kg)  11/21/22 206 lb 9.6 oz (93.7 kg)     GEN: Well nourished, well developed in no acute distress HEENT: Normal NECK: No JVD; No carotid bruits LYMPHATICS: No lymphadenopathy CARDIAC: S1S2 noted,RRR, no murmurs, rubs, gallops RESPIRATORY:  Clear to auscultation without rales, wheezing or rhonchi  ABDOMEN: Soft, non-tender, non-distended, +bowel sounds, no guarding. EXTREMITIES: No edema, No cyanosis, no clubbing MUSCULOSKELETAL:  No deformity  SKIN: Warm and dry NEUROLOGIC:  Alert and oriented x 3, non-focal PSYCHIATRIC:  Normal affect, good insight  ASSESSMENT:    1. Paroxysmal atrial fibrillation (HCC)   2. Essential hypertension   3. PVCs (premature ventricular contractions)   4. Coronary artery disease of bypass graft of native heart with stable angina pectoris (HCC)   5. Pulmonary hypertension, unspecified (HCC)   6. Hypercoagulable state due to persistent atrial fibrillation (HCC)   7. Morbid obesity  (HCC)   8. H/O mitral valve replacement  with mechanical valve    PLAN:     Paroxysmal Atrial Fibrillation Post-ablation with intermittent episodes of perceived AFib. No recent monitoring to confirm AFib episodes. -Order ambulatory heart monitor to assess AFib burden. -Consider medication adjustment based on monitoring results.  Hypertension Blood pressure on the lower side, possibly due to multiple medications. -Consider future medication adjustment based on heart status and AFib burden.  CAD - no anginal symptoms reported. Continue with current regimen   Mechanical valve - follows with coumadin clinic at the [facility] clinic. Recent echo was 03/2022. Repeat echo in 1 year   General Health Maintenance -Check recent kidney function tests performed by primary care provider. -Schedule follow-up appointment in 3 months to discuss monitoring results and potential medication adjustments.  The patient is in agreement with the above plan. The patient left the office in stable condition.  The patient will follow up in   Medication Adjustments/Labs and Tests Ordered: Current medicines are reviewed at length with the patient today.  Concerns regarding medicines are outlined above.  Orders Placed This Encounter  Procedures   LONG TERM MONITOR (3-14 DAYS)   No orders of the defined types were placed in this encounter.   Patient Instructions  Medication Instructions:  Your physician recommends that you continue on your current medications as directed. Please refer to the Current Medication list given to you today.  *If you need a refill on your cardiac medications before your next appointment, please call your pharmacy*   Lab Work: None   Testing/Procedures: Christena Deem- Long Term Monitor Instructions  Your physician has requested you wear a ZIO patch monitor for 14 days.  This is a single patch monitor. [employer] supplies one patch monitor per enrollment. Additional stickers are not  available. Please do not apply patch if you will be having a Nuclear Stress Test,  Echocardiogram, Cardiac CT, MRI, or Chest Xray during the period you would be wearing the  monitor. The patch cannot be worn during these tests. You cannot remove and re-apply the  ZIO XT patch monitor.  Your ZIO patch monitor will be mailed 3 day USPS to your address on file. It may take 3-5 days  to receive your monitor after you have been enrolled.  Once you have received your monitor, please review the enclosed instructions. Your monitor  has already been registered assigning a specific monitor serial # to you.  Billing and Patient Assistance Program Information  We have supplied [employer] with any of your insurance information on file for billing purposes. [employer] offers a sliding scale Patient Assistance Program for patients that do not have  insurance, or whose insurance does not completely cover the cost of the ZIO monitor.  You must apply for the Patient Assistance Program to qualify for this discounted rate.  To apply, please call [employer] at [phone], select option 4, select option 2, ask to apply for  Patient Assistance Program. Meredeth Ide will ask your household income, and how many people  are in your household. They will quote your out-of-pocket cost based on that information.  [employer] will also be able to set up a 90-month, interest-free payment plan if needed.  Applying the monitor   Shave hair from upper left chest.  Hold abrader disc by orange tab. Rub abrader in 40 strokes over the upper left chest as  indicated in your monitor instructions.  Clean area with 4 enclosed alcohol pads. Let dry.  Apply patch as indicated in monitor instructions. Patch will be placed under  collarbone on left  side of chest with arrow pointing upward.  Rub patch adhesive wings for 2 minutes. Remove white label marked "1". Remove the white  label marked "2". Rub patch adhesive wings for 2 additional minutes.   While looking in a mirror, press and release button in center of patch. A small green light will  flash 3-4 times. This will be your only indicator that the monitor has been turned on.  Do not shower for the first 24 hours. You may shower after the first 24 hours.  Press the button if you feel a symptom. You will hear a small click. Record Date, Time and  Symptom in the Patient Logbook.  When you are ready to remove the patch, follow instructions on the last 2 pages of Patient  Logbook. Stick patch monitor onto the last page of Patient Logbook.  Place Patient Logbook in the blue and white box. Use locking tab on box and tape box closed  securely. The blue and white box has prepaid postage on it. Please place it in the mailbox as  soon as possible. Your physician should have your test results approximately 7 days after the  monitor has been mailed back to [facility].  Call [facility] Customer Care at [phone] if you have questions regarding  your ZIO XT patch monitor. Call them immediately if you see an orange light blinking on your  monitor.  If your monitor falls off in less than 4 days, contact our Monitor department at [phone].  If your monitor becomes loose or falls off after 4 days call [employer] at [phone] for  suggestions on securing your monitor    Follow-Up: At [facility], you and your health needs are our priority.  As part of our continuing mission to provide you with exceptional heart care, we have created designated Provider Care Teams.  These Care Teams include your primary Cardiologist (physician) and Advanced Practice Providers (APPs -  Physician Assistants and Nurse Practitioners) who all work together to provide you with the care you need, when you need it.   Your next appointment:   3 month(s)  Provider:   Thomasene Ripple, DO     Adopting a Healthy Lifestyle.  Know what a healthy weight is for you (roughly BMI <25) and aim to maintain  this   Aim for 7+ servings of fruits and vegetables daily   65-80+ fluid ounces of water or unsweet tea for healthy kidneys   Limit to max 1 drink of alcohol per day; avoid smoking/tobacco   Limit animal fats in diet for cholesterol and heart health - choose grass fed whenever available   Avoid highly processed foods, and foods high in saturated/trans fats   Aim for low stress - take time to unwind and care for your mental health   Aim for 150 min of moderate intensity exercise weekly for heart health, and weights twice weekly for bone health   Aim for 7-9 hours of sleep daily   When it comes to diets, agreement about the perfect plan isnt easy to find, even among the experts. Experts at the [facility] [employer] developed an idea known as the Healthy Eating Plate. Just imagine a plate divided into logical, healthy portions.   The emphasis is on diet quality:   Load up on vegetables and fruits - one-half of your plate: Aim for color and variety, and remember that potatoes dont count.   Go for whole grains - one-quarter of  your plate: Whole wheat, barley, wheat berries, quinoa, oats, brown rice, and foods made with them. If you want pasta, go with whole wheat pasta.   Protein power - one-quarter of your plate: Fish, chicken, beans, and nuts are all healthy, versatile protein sources. Limit red meat.   The diet, however, does go beyond the plate, offering a few other suggestions.   Use healthy plant oils, such as olive, canola, soy, corn, sunflower and peanut. Check the labels, and avoid partially hydrogenated oil, which have unhealthy trans fats.   If youre thirsty, drink water. Coffee and tea are good in moderation, but skip sugary drinks and limit milk and dairy products to one or two daily servings.   The type of carbohydrate in the diet is more important than the amount. Some sources of carbohydrates, such as vegetables, fruits, whole grains, and beans-are healthier  than others.   Finally, stay active  Signed, Thomasene Ripple, DO  12/28/2022 11:12 PM    [facility]

## 2022-12-30 NOTE — Progress Notes (Deleted)
09/27/22- 62 yoF former smoker (60 pkyrs) with OSA, COPD, Valvular Heart Disease/ Mitral and Aortic replacement, CHF, ASCVD/ CABG, HTN, PAFib, PHTN, DM2/ CKD3, Hearing Loss, Lumbar DDD, Anxiety,  HST WatchPat 04/20/22-AHI 25.5/ hr, desat to 76%, body weight 210 lbs CPAP titration 09/24/22- auto 4-12 CPAP 13/ [employer] AirSense 11 AutoSet PFT 2021- Nl Spiro, min resp to BD, Airtrapping, NL DLCO O2 2L sleep/ [employer] ordered 05/15/22- she doesn't have home O2 Download compliance- Body weight today- 210 lbs -----Having problems with CPAP.  Mask is filling up with moisture and mask is popping off Mask fits poorly, uncomfortable. Humidifier over active- mask and face wet. She feels CPAP set too high- blows mask off. Explained humidifier adjustment for comfort.  01/02/23- 62 yoF former smoker (60 pkyrs) with OSA, COPD, Valvular Heart Disease/ Mitral and Aortic replacement, CHF, ASCVD/ CABG, HTN, PAFib, PHTN, DM2/ CKD3, Hearing Loss, Lumbar DDD, Anxiety,  HST WatchPat 04/20/22-AHI 25.5/ hr, desat to 76%, body weight 210 lbs CPAP titration 09/24/22> auto 4-12 PFT 2021- Nl Spiro, min resp to BD, Airtrapping, NL DLCO O2 2L sleep/ [facility] ordered 05/15/22- she doesn't have home O2 CPAP 13/ [employer] AirSense 11 AutoSet Download compliance- Body weight today-  ROS-see HPI   + = positive Constitutional:    weight loss, night sweats, fevers, chills, fatigue, lassitude. HEENT:    headaches, difficulty swallowing, tooth/dental problems, sore throat,       sneezing, itching, ear ache, nasal congestion, post nasal drip, snoring CV:    chest pain, orthopnea, PND, swelling in lower extremities, anasarca,                 dizziness, palpitations Resp:   +shortness of breath with exertion or at rest.                productive cough,   non-productive cough, coughing up of blood.              change in color of mucus.  wheezing.   Skin:    rash or lesions. GI:  No-   heartburn, indigestion, abdominal pain, nausea, vomiting,  diarrhea,                 change in bowel habits, loss of appetite GU: dysuria, change in color of urine, no urgency or frequency.   flank pain. MS:   joint pain, stiffness, decreased range of motion, back pain. Neuro-     nothing unusual Psych:  change in mood or affect.  depression or anxiety.   memory loss.  OBJ- Physical Exam General- Alert, Oriented, Affect-appropriate, Distress- none acute, +obese Skin- rash-none, lesions- none, excoriation- none Lymphadenopathy- none Head- atraumatic            Eyes- Gross vision intact, PERRLA, conjunctivae and secretions clear            Ears- Hearing, canals-normal            Nose- Clear, no-Septal dev, mucus, polyps, erosion, perforation             Throat- Mallampati III-IV , mucosa clear , drainage- none, tonsils- atrophic, +teeth Neck- flexible , trachea midline, no stridor , thyroid nl, carotid no bruit Chest - symmetrical excursion , unlabored           Heart/CV- RRR ,+ murmur 2+S , Crisp valve click, no gallop  , no rub, nl s1 s2                           -  JVD- none , edema- none, stasis changes- none, varices- none           Lung- clear to P&A, wheeze- none, cough- none , dullness-none, rub- none           Chest wall-  Abd-  Br/ Gen/ Rectal- Not done, not indicated Extrem- cyanosis- none, clubbing, none, atrophy- none, strength- nl Neuro- grossly intact to observation

## 2022-12-31 DIAGNOSIS — G4733 Obstructive sleep apnea (adult) (pediatric): Secondary | ICD-10-CM | POA: Diagnosis not present

## 2022-12-31 DIAGNOSIS — L821 Other seborrheic keratosis: Secondary | ICD-10-CM | POA: Diagnosis not present

## 2022-12-31 DIAGNOSIS — L918 Other hypertrophic disorders of the skin: Secondary | ICD-10-CM | POA: Diagnosis not present

## 2022-12-31 DIAGNOSIS — L209 Atopic dermatitis, unspecified: Secondary | ICD-10-CM | POA: Diagnosis not present

## 2022-12-31 DIAGNOSIS — D2239 Melanocytic nevi of other parts of face: Secondary | ICD-10-CM | POA: Diagnosis not present

## 2022-12-31 NOTE — Telephone Encounter (Signed)
Tried calling the pt and there was no answer and no option to leave msg. Will close encounter per protocol.

## 2023-01-01 ENCOUNTER — Encounter: Payer: Self-pay | Admitting: Internal Medicine

## 2023-01-01 DIAGNOSIS — Z7901 Long term (current) use of anticoagulants: Secondary | ICD-10-CM | POA: Diagnosis not present

## 2023-01-01 DIAGNOSIS — Z952 Presence of prosthetic heart valve: Secondary | ICD-10-CM | POA: Diagnosis not present

## 2023-01-01 DIAGNOSIS — I480 Paroxysmal atrial fibrillation: Secondary | ICD-10-CM | POA: Diagnosis not present

## 2023-01-02 ENCOUNTER — Encounter: Payer: Self-pay | Admitting: Family Medicine

## 2023-01-02 ENCOUNTER — Other Ambulatory Visit: Payer: Self-pay | Admitting: *Deleted

## 2023-01-02 ENCOUNTER — Ambulatory Visit: Payer: 59 | Admitting: Internal Medicine

## 2023-01-02 DIAGNOSIS — G4733 Obstructive sleep apnea (adult) (pediatric): Secondary | ICD-10-CM

## 2023-01-02 NOTE — Telephone Encounter (Signed)
On 8/9 I asked for [employer] to reduce CPAP auto range to 4-10, and to install download capability - AirView or card. Please verify with [employer] and make sure they follow up with [name].

## 2023-01-03 DIAGNOSIS — I480 Paroxysmal atrial fibrillation: Secondary | ICD-10-CM

## 2023-01-05 ENCOUNTER — Encounter: Payer: Self-pay | Admitting: Family Medicine

## 2023-01-05 DIAGNOSIS — F411 Generalized anxiety disorder: Secondary | ICD-10-CM

## 2023-01-06 ENCOUNTER — Ambulatory Visit (INDEPENDENT_AMBULATORY_CARE_PROVIDER_SITE_OTHER): Payer: 59

## 2023-01-06 ENCOUNTER — Encounter: Payer: Self-pay | Admitting: Cardiology

## 2023-01-06 ENCOUNTER — Ambulatory Visit: Payer: 59 | Attending: Cardiology | Admitting: Cardiology

## 2023-01-06 ENCOUNTER — Telehealth: Payer: Self-pay | Admitting: Cardiology

## 2023-01-06 VITALS — BP 104/56 | HR 64 | Ht 65.0 in | Wt 211.0 lb

## 2023-01-06 DIAGNOSIS — I480 Paroxysmal atrial fibrillation: Secondary | ICD-10-CM | POA: Diagnosis not present

## 2023-01-06 DIAGNOSIS — I4819 Other persistent atrial fibrillation: Secondary | ICD-10-CM | POA: Diagnosis not present

## 2023-01-06 DIAGNOSIS — I493 Ventricular premature depolarization: Secondary | ICD-10-CM | POA: Diagnosis not present

## 2023-01-06 DIAGNOSIS — Z5181 Encounter for therapeutic drug level monitoring: Secondary | ICD-10-CM

## 2023-01-06 DIAGNOSIS — Z952 Presence of prosthetic heart valve: Secondary | ICD-10-CM

## 2023-01-06 LAB — POCT INR: INR: 3.1 — AB (ref 2.0–3.0)

## 2023-01-06 MED ORDER — CLONAZEPAM 0.5 MG PO TABS: 0.7500 mg | ORAL_TABLET | Freq: Every day | ORAL | 0 refills | Status: DC

## 2023-01-06 NOTE — Telephone Encounter (Signed)
Had pt remove while here at Dr. Rodrigo Ran, per Tobb's instruction.

## 2023-01-06 NOTE — Telephone Encounter (Signed)
Patient identification verified by 2 forms. Marilynn Rail, RN    Called and spoke to patient  Patient states:   -is reacting to the adhesive on the heart monitor   -the area is itchy and red   -she was unable to sleep due to the itching   -this is the 4th day with the monitor   -she will not be able to make it for 14 day   -unlikely to take benadryl since it makes her sleepy  Informed patient message sent to Dr. Servando Salina for input/advisement  Patient Verbalized understanding, no questions

## 2023-01-06 NOTE — Progress Notes (Signed)
Electrophysiology Office Note:   Date:  01/06/2023  ID:  Kimberly Hobbs, DOB August 02, 1960, MRN [id number]  Primary Cardiologist: Thomasene Ripple, DO Electrophysiologist: Regan Lemming, MD      History of Present Illness:   Kimberly Hobbs is a 62 y.o. 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   Since last being seen in our clinic the patient reports doing overall well.  She has had a significantly reduced burden of atrial fibrillation since her ablation.  She has had continued short episodes, though she feels much improved.  She was started on amiodarone, and has not had an episode in 2 weeks.  she denies chest pain, palpitations, dyspnea, PND, orthopnea, nausea, vomiting, dizziness, syncope, edema, weight gain, or early satiety.   Review of systems complete and found to be negative unless listed in HPI.   EP Information / Studies Reviewed:    EKG is ordered today. Personal review as below.  EKG Interpretation Date/Time:  Monday January 06 2023 15:13:19 EST Ventricular Rate:  64 PR Interval:  190 QRS Duration:  122 QT Interval:  460 QTC Calculation: 474 R Axis:   -53  Text Interpretation: Normal sinus rhythm Left axis deviation Non-specific intra-ventricular conduction delay Minimal voltage criteria for LVH, may be normal variant ( Cornell product ) Nonspecific ST abnormality When compared with ECG of 21-Nov-2022 08:46, No significant change was found Confirmed by Kamrin Spath ([id number]) on 01/06/2023 3:15:19 PM     Risk Assessment/Calculations:    CHA2DS2-VASc Score = 5   This indicates a 7.2% annual risk of stroke. The patient's score is based upon: CHF History: 1 HTN History: 1 Diabetes History: 1 Stroke History: 0 Vascular Disease  History: 1 Age Score: 0 Gender Score: 1        STOP-Bang Score:  2       Physical Exam:   VS:  BP (!) 104/56 (BP Location: Left Arm, Patient Position: Sitting, Cuff Size: Large)   Pulse 64   Ht 5\' 5"  (1.651 m)   Wt 211 lb (95.7 kg)   LMP 06/20/2015   SpO2 97%   BMI 35.11 kg/m    Wt Readings from Last 3 Encounters:  01/06/23 211 lb (95.7 kg)  12/27/22 211 lb (95.7 kg)  12/23/22 206 lb (93.4 kg)     GEN: Well nourished, well developed in no acute distress NECK: No JVD; No carotid bruits CARDIAC: Regular rate and rhythm, prosthetic valve click RESPIRATORY:  Clear to auscultation without rales, wheezing or rhonchi  ABDOMEN: Soft, non-tender, non-distended EXTREMITIES:  No edema; No deformity   ASSESSMENT AND PLAN:    1.  Persistent atrial fibrillation: Post ablation 09/05/2022.  She remains in sinus rhythm.  She is currently on amiodarone.  She has not had any further episodes of atrial fibrillation in the last few weeks.  Kimberly Hobbs stop amiodarone today.  If more episodes of atrial fibrillation occur, she would be a candidate for repeat ablation versus dofetilide.  2.  Coronary artery disease: Post CABG.  No current angina.  Plan per primary cardiology.  3.  Post MVR/AVR: Currently on warfarin.  Plan per primary cardiology.  4.  Hypertension: Currently well-controlled  5.  Chronic diastolic heart failure: No obvious volume overload  6.  Obesity: Lifestyle modification encouraged  7.  Secondary hypercoagulable state: Currently on warfarin  for atrial fibrillation  Follow up with Afib Clinic in 3 months  Signed, Margart Zemanek Jorja Loa, MD

## 2023-01-06 NOTE — Telephone Encounter (Signed)
Patient states that she is having bad reaction to the heart monitor. Please advise

## 2023-01-06 NOTE — Patient Instructions (Signed)
Description   Continue taking warfarin 5mg  daily except 7.5mg  on Monday, Wednesday, and Friday.  Recheck in 3 weeks in [place].  Anticoagulation Clinic [phone]

## 2023-01-06 NOTE — Telephone Encounter (Signed)
Tobb, Kardie, DO  You15 minutes ago (3:01 PM)    Please have her remove the monitor and mail it. We can use the data on the monitor  ___________________________________________ Patient identification verified by 2 forms. Marilynn Rail, RN   Called and spoke to patient  Relayed provider message below  Patient verbalized understanding, no questions at this time

## 2023-01-06 NOTE — Telephone Encounter (Signed)
Tobb, Kardie, DO  Lindell Spar, RN Cc: Reynolds Bowl, RN Or I see that - at first my understanding she has it on for 4 days. You are right Eileen Stanford, please send her the preventice monitor

## 2023-01-06 NOTE — Patient Instructions (Addendum)
Medication Instructions:  Your physician has recommended you make the following change in your medication:  STOP Amiodarone  *If you need a refill on your cardiac medications before your next appointment, please call your pharmacy*   Lab Work: None ordered.   Testing/Procedures: None ordered   Follow-Up: At [facility], you and your health needs are our priority.  As part of our continuing mission to provide you with exceptional heart care, we have created designated Provider Care Teams.  These Care Teams include your primary Cardiologist (physician) and Advanced Practice Providers (APPs -  Physician Assistants and Nurse Practitioners) who all work together to provide you with the care you need, when you need it.  Your next appointment:   3 month(s)  The format for your next appointment:   In Person  Provider:   You will follow up in the Atrial Fibrillation Clinic located at [facility] [place]. Your provider will be: [name]. [name], PA-C or Lake Bells, PA-C    Thank you for choosing [facility]!!   Dory Horn, RN [phone]

## 2023-01-06 NOTE — Addendum Note (Signed)
Addended by: Saralyn Pilar on: 01/06/2023 09:37 AM   Modules accepted: Orders

## 2023-01-07 ENCOUNTER — Encounter: Payer: 59 | Attending: Physical Medicine & Rehabilitation | Admitting: Physical Medicine & Rehabilitation

## 2023-01-07 ENCOUNTER — Encounter: Payer: Self-pay | Admitting: Physical Medicine & Rehabilitation

## 2023-01-07 VITALS — BP 97/61 | HR 61 | Ht 65.0 in | Wt 211.0 lb

## 2023-01-07 DIAGNOSIS — M545 Low back pain, unspecified: Secondary | ICD-10-CM

## 2023-01-07 DIAGNOSIS — F411 Generalized anxiety disorder: Secondary | ICD-10-CM | POA: Diagnosis not present

## 2023-01-07 DIAGNOSIS — G8929 Other chronic pain: Secondary | ICD-10-CM | POA: Diagnosis not present

## 2023-01-07 NOTE — Progress Notes (Signed)
Subjective:    Patient ID: Kimberly Hobbs, female    DOB: [birth year], 61 y.o.   MRN: [id number]  HPI   Kimberly Hobbs is a 62 y.o. year old female  who  has a past medical history of CKD stage 3 due to type 2 diabetes mellitus (HCC) (05/15/2021), COVID-19, Hyperlipidemia, Hypertension, Pain in both lower extremities (07/23/2019), and Rheumatic heart disease.   They are presenting to PM&R clinic as a new patient for pain management evaluation. They were referred by [name] for treatment of low back pain.  She cant stand for long. Has to use a walker.  Pain is severe if she walks without this. Pain started about 2 years ago but is worsening. 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  Pain is mostly on the right side of her lower back. Sometimes both sides. It doesn't shoot down her legs.  Lying down helps the pain.  She reports her activities are very limited by her pain.  She has been taking hydrocodone 5/325 1 pain is very severe. History of prosthetic valves, schedule for ablation for A-fib       Red flag symptoms: No red flags for back pain endorsed in Hx or ROS   Medications tried: Topical medications - doesn't recall name, didn't help Nsaids- cannot take for cardiac reasons Tylenol- doesn't help Opiates  Hydrocodone -she uses sparingly when pain is very severe, this does help control her pain Gabapentin / Lyrica  Has not tried  Tramadol-does not remember TCAs  - Denies  She is currently on Celexa for her anxiety, this medication visit has been working very well for her Robaxin helps pain   Other treatments: PT/OT  has not tried, in afib waiting on heart ablation  TENs unit - has not tried  Injections-denies Surgery - denies      Interal History 08/23/22   Kimberly Hobbs is here for follow-up regarding her chronic pain.  She reports she continues to have right-sided back pain.  She also has some aching pain in her  legs bilaterally.  She reports she has her cardiac ablation procedures 09/05/2022 so after this time she would be interested in getting facet joint injections.  Pain is worsened with standing and she continues to use her walker for ambulation.  Pain continues to be improved with lying down.  Vicodin 7.5 is helping her control this pain.  She tried to use this sparingly only when pain is very severe and 30 tablets has lasted her about a month and a half.  She is not having any side effects with this medication.   Interal History 10/29/2022 Kimberly Hobbs is here for follow-up regarding her right-sided back pain with aching pain in her legs.  She continues to use occasional Vicodin 7.5 which helps keep her pain controlled when it is very severe.  She tries to use this sparingly.  She has been started on gabapentin 100 mg at bedtime which has greatly reduced her bilateral leg pain.  She reports her Celexa was discontinued because it was no longer helping.  She says she is currently not on any antidepressant medicines for anxiety.  She has had a few sessions of physical therapy but has not had significant benefits at this time. Patient has been using occasional Flexeril 10 mg with benefit, she does not use this when she takes for Norco.     Interal History 01/07/23 Kimberly Hobbs  is here for follow-up regarding her right-sided back pain and leg pain.  She continues to use Vicodin 7.5 mg sparingly when the pain is severe.  She still has most of her pills from her last refill.  Leg pain continues to be improved since starting gabapentin.  She stopped using Cymbalta, says she did not tolerate this and made her feel bad.  She reports pain is not significantly improved after physical therapy.  Pain Inventory Average Pain 5 Pain Right Now 5 My pain is intermittent, sharp, and burning  In the last 24 hours, has pain interfered with the following? General activity 5 Relation with others 0 Enjoyment of life 3 What TIME  of day is your pain at its worst? evening and night Sleep (in general) Poor  Pain is worse with: walking, standing, and some activites Pain improves with: rest and medication Relief from Meds: 4  Family History  Problem Relation Age of Onset   Cancer Mother    Hypertension Mother    Diabetes Mother    Hyperlipidemia Mother    Heart disease Mother    Cancer Father    Hypertension Father    Diabetes Father    Heart disease Father    Hypertension Sister    Schizophrenia Maternal Grandmother    Hypertension Brother    Hypertension Brother    Cancer Daughter    COPD Daughter    Breast cancer Neg Hx    Social History   Socioeconomic History   Marital status: Divorced    Spouse name: Not on file   Number of children: 4   Years of education: Not on file   Highest education level: GED or equivalent  Occupational History   Not on file  Tobacco Use   Smoking status: Former    Current packs/day: 0.00    Average packs/day: 1.6 packs/day for 50.0 years (80.0 ttl pk-yrs)    Types: Cigarettes    Start date: 12/23/1976    Quit date: 12/23/2016    Years since quitting: 6.0    Passive exposure: Past   Smokeless tobacco: Never   Tobacco comments:    Stoped in 2015  Vaping Use   Vaping status: Never Used  Substance and Sexual Activity   Alcohol use: Never   Drug use: Yes    Types: Hydrocodone   Sexual activity: Not Currently  Other Topics Concern   Not on file  Social History Narrative   ** Merged History Encounter **       Epworth Sleepiness Scale = 4 (as of 06/28/2015)   Social Determinants of Health   Financial Resource Strain: Low Risk  (10/14/2022)   Overall Financial Resource Strain (CARDIA)    Difficulty of Paying Living Expenses: Not hard at all  Food Insecurity: No Food Insecurity (10/14/2022)   Hunger Vital Sign    Worried About Running Out of Food in the Last Year: Never true    Ran Out of Food in the Last Year: Never true  Transportation Needs: No  Transportation Needs (10/14/2022)   PRAPARE - Administrator, Civil Service (Medical): No    Lack of Transportation (Non-Medical): No  Physical Activity: Inactive (10/14/2022)   Exercise Vital Sign    Days of Exercise per Week: 0 days    Minutes of Exercise per Session: 0 min  Stress: Stress Concern Present (10/14/2022)   [employer] of Occupational Health - Occupational Stress Questionnaire    Feeling of Stress : To some extent  Social Connections: Moderately Isolated (10/14/2022)   Social Connection and Isolation Panel [NHANES]    Frequency of Communication with Friends and Family: More than three times a week    Frequency of Social Gatherings with Friends and Family: Never    Attends Religious Services: Never    Database administrator or Organizations: No    Attends Banker Meetings: Never    Marital Status: Living with partner   Past Surgical History:  Procedure Laterality Date   ATRIAL FIBRILLATION ABLATION N/A 09/05/2022   Procedure: ATRIAL FIBRILLATION ABLATION;  Surgeon: Regan Lemming, MD;  Location: [facility];  Service: Cardiovascular;  Laterality: N/A;   CARDIAC CATHETERIZATION     CARDIAC VALVE REPLACEMENT     CARDIOVERSION N/A 04/02/2022   Procedure: CARDIOVERSION;  Surgeon: Elder Negus, MD;  Location: [facility];  Service: Cardiovascular;  Laterality: N/A;   CARDIOVERSION N/A 04/27/2022   Procedure: CARDIOVERSION;  Surgeon: Elder Negus, MD;  Location: [facility];  Service: Cardiovascular;  Laterality: N/A;   CHOLECYSTECTOMY     CORONARY ARTERY BYPASS GRAFT     CORONARY/GRAFT ANGIOGRAPHY N/A 08/18/2018   Procedure: CORONARY/GRAFT ANGIOGRAPHY;  Surgeon: Elder Negus, MD;  Location: [facility];  Service: Cardiovascular;  Laterality: N/A;   LEG SURGERY     "metal plate in leg"   RIGHT HEART CATH AND CORONARY/GRAFT ANGIOGRAPHY N/A 07/21/2018   Procedure: RIGHT HEART CATH AND CORONARY/GRAFT  ANGIOGRAPHY;  Surgeon: Elder Negus, MD;  Location: [facility];  Service: Cardiovascular;  Laterality: N/A;   TEE WITHOUT CARDIOVERSION N/A 07/27/2015   Procedure: TRANSESOPHAGEAL ECHOCARDIOGRAM (TEE);  Surgeon: Thurmon Fair, MD;  Location: [facility] ENDOSCOPY;  Service: Cardiovascular;  Laterality: N/A;   TEE WITHOUT CARDIOVERSION N/A 07/21/2018   Procedure: TRANSESOPHAGEAL ECHOCARDIOGRAM (TEE);  Surgeon: Elder Negus, MD;  Location: [facility] ENDOSCOPY;  Service: Cardiovascular;  Laterality: N/A;   TONSILLECTOMY AND ADENOIDECTOMY     Past Surgical History:  Procedure Laterality Date   ATRIAL FIBRILLATION ABLATION N/A 09/05/2022   Procedure: ATRIAL FIBRILLATION ABLATION;  Surgeon: Regan Lemming, MD;  Location: [facility];  Service: Cardiovascular;  Laterality: N/A;   CARDIAC CATHETERIZATION     CARDIAC VALVE REPLACEMENT     CARDIOVERSION N/A 04/02/2022   Procedure: CARDIOVERSION;  Surgeon: Elder Negus, MD;  Location: [facility];  Service: Cardiovascular;  Laterality: N/A;   CARDIOVERSION N/A 04/27/2022   Procedure: CARDIOVERSION;  Surgeon: Elder Negus, MD;  Location: [facility];  Service: Cardiovascular;  Laterality: N/A;   CHOLECYSTECTOMY     CORONARY ARTERY BYPASS GRAFT     CORONARY/GRAFT ANGIOGRAPHY N/A 08/18/2018   Procedure: CORONARY/GRAFT ANGIOGRAPHY;  Surgeon: Elder Negus, MD;  [facility]: [facility];  Service: Cardiovascular;  Laterality: N/A;   LEG SURGERY     "metal plate in leg"   RIGHT HEART CATH AND CORONARY/GRAFT ANGIOGRAPHY N/A 07/21/2018   Procedure: RIGHT HEART CATH AND CORONARY/GRAFT ANGIOGRAPHY;  Surgeon: Elder Negus, MD;  Location: [facility];  Service: Cardiovascular;  Laterality: N/A;   TEE WITHOUT CARDIOVERSION N/A 07/27/2015   Procedure: TRANSESOPHAGEAL ECHOCARDIOGRAM (TEE);  Surgeon: Thurmon Fair, MD;  Location: [facility] ENDOSCOPY;  Service: Cardiovascular;  Laterality: N/A;   TEE WITHOUT  CARDIOVERSION N/A 07/21/2018   Procedure: TRANSESOPHAGEAL ECHOCARDIOGRAM (TEE);  Surgeon: Elder Negus, MD;  Location: [facility] ENDOSCOPY;  Service: Cardiovascular;  Laterality: N/A;   TONSILLECTOMY AND ADENOIDECTOMY     Past Medical History:  Diagnosis  Date   Allergy ?   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 01/01/2017 01/01/2017   LMP 06/20/2015   Opioid Risk Score:   Fall Risk Score:  `1  Depression screen [facility] 2/9     12/23/2022   10:25 AM 10/14/2022   10:49 AM 08/23/2022   10:32 AM 07/08/2022    9:52 AM 06/10/2022    9:24 AM 03/26/2022   11:02 AM 02/26/2022   11:17 AM  Depression screen PHQ 2/9  Decreased Interest 0 0 1 0 1 3 0  Down, Depressed, Hopeless 0 1 1 0 0 3 0  PHQ - 2 Score 0 1 2 0 1 6 0  Altered sleeping 3 0   3    Tired, decreased energy 1 1   2     Change in appetite 3 0   3    Feeling bad or failure about yourself  0 0   0    Trouble concentrating 0 0   0    Moving slowly or fidgety/restless 0 0   0    Suicidal thoughts 0 0   0    PHQ-9 Score 7 2   9     Difficult doing work/chores Not difficult at all Somewhat difficult   Very difficult        Review of Systems  Musculoskeletal:  Positive for back pain.  All other systems reviewed and are negative.      Objective:   Physical Exam   Gen: no distress, normal appearing HEENT: oral mucosa pink and moist, NCAT Cardio: Reg rate Chest: normal effort, normal rate of breathing Abd:  soft, non-distended Ext: no edema Psych: pleasant, normal affect Skin: intact Neuro: Alert and awake, follows commands, cranial nerves II through XII intact, intact insight and judgment, speech and language intact Strength 5 out of 5 in all 4 extremities Sensation intact light touch in all 4 extremities Musculoskeletal:  Tenderness over paraspinal muscles lower lumbar spine (R>L) TTP paraspinal muscles mostly around L4 level on the left Mild tenderness noted over the right SI joint Slump test negative bilaterally She has pain with spinal extension   MRI L-spine 1/17/2024IMPRESSION: 1. L4-L5 moderate facet arthropathy, which can be a cause of back pain. Narrowing of the lateral recesses at this level could affect the descending L5 nerve roots. 2. No spinal canal stenosis or neural foraminal narrowing.        Assessment & Plan:    Chronic lower back pain bilateral but worse on the right without radiculopathy -No improvement with Tylenol, unable to take NSAIDs due to cardiac history -She has some tenderness over her right SI joint however do not think this is the main source of her pain at this time -Pain is primarily axial in her lower back -List of foods for pain provided at prior visit -ORT low -TENS unit, nexwave device ordered prior visit -Continue Norco 7.5 twice  daily as needed, #60 , advised to call when she needs a refill -Continue current dose of gabapentin -Will start Cymbalta 20 mg daily, she would like to start at a low dose -She reports Physical Therapy this year did not provide significant pain improvement  -discontinuing if no benefit noted in the next few sessions -Pain agreement completed prior visit -Continue Flexeril 10 mg up to 3 times daily as needed, however advised to continue using sparingly -Discussed facet joint injections B/L L3-4, L4-5, R L5-S1- Consult to Dr. Jodean Lima , check with cardiology regarding holding anticoagulation  -Continue UDS, Pill  Counts- consistent, PDMP monitoring      Chronic anxiety, denies SI or HI -No longer on Celexa, Cymbalta- she says "it made her feel bad"

## 2023-01-08 NOTE — Telephone Encounter (Signed)
Hi Tiphani Mells,  Please send a new order for a long term monitor, specify [employer] and write in text sensitive skin alternative.  I will process the order and patient should receive via UPS. Usually a monitor company will charge for a monitor worn more than 24 hours. Would you want the patient to mail her ZIO back for processing, or should I just try to cancel it due to allergic reaction?   Please let me know. Thanks, [employer]

## 2023-01-10 DIAGNOSIS — I480 Paroxysmal atrial fibrillation: Secondary | ICD-10-CM | POA: Diagnosis not present

## 2023-01-13 ENCOUNTER — Encounter: Payer: Self-pay | Admitting: Cardiology

## 2023-01-13 ENCOUNTER — Telehealth: Payer: Self-pay | Admitting: Cardiology

## 2023-01-13 NOTE — Telephone Encounter (Signed)
Monitor resulted and released to MyChart.

## 2023-01-13 NOTE — Telephone Encounter (Signed)
Discussed with patient. Advised to continue monitoring for now and to call back if she is out of rhythm for more than 24-48 hours and or symptomatic. Advised that I do not recommend restarting the Amiodarone as MD may want to start a different antiarrythmic.  Will address that if needed if remains out of rhythm and/or symptomatic. Pt was just worries with HRs jumping up and down. Advised when to call back or go to ED. Patient verbalized understanding and agreeable to plan.

## 2023-01-13 NOTE — Telephone Encounter (Signed)
Patient c/o Palpitations:  STAT if patient reporting lightheadedness, shortness of breath, or chest pain  How long have you had palpitations/irregular HR/ Afib? Are you having the symptoms now? AFIB Started 30 mins ago  Are you currently experiencing lightheadedness, SOB or CP? no  Do you have a history of afib (atrial fibrillation) or irregular heart rhythm? yes  Have you checked your BP or HR? (document readings if available): HR is up and down - HR ranging 91-125  Are you experiencing any other symptoms? fatigue

## 2023-01-15 ENCOUNTER — Telehealth: Payer: Self-pay | Admitting: Cardiology

## 2023-01-15 ENCOUNTER — Encounter: Payer: Self-pay | Admitting: Cardiology

## 2023-01-15 DIAGNOSIS — I480 Paroxysmal atrial fibrillation: Secondary | ICD-10-CM

## 2023-01-15 DIAGNOSIS — I10 Essential (primary) hypertension: Secondary | ICD-10-CM

## 2023-01-15 MED ORDER — LOSARTAN POTASSIUM 50 MG PO TABS: 25.0000 mg | ORAL_TABLET | Freq: Every day | ORAL | Status: DC

## 2023-01-15 MED ORDER — METOPROLOL SUCCINATE ER 25 MG PO TB24: 25.0000 mg | ORAL_TABLET | Freq: Two times a day (BID) | ORAL | 1 refills | Status: DC

## 2023-01-15 NOTE — Telephone Encounter (Signed)
See previous message. Pt is calling back because she is still in afib. Please advise

## 2023-01-15 NOTE — Telephone Encounter (Signed)
Called pt in regards to Afib.  Reports has been in AF intermittently for about 48 hours .  Has slight SOB no really symptomatic just worried that HR is sustaining at 112.   Missed a dose of warfarin about 3 days ago. BP 127/81-112.   Advised pt will send message to AF clinic to f/u.

## 2023-01-15 NOTE — Telephone Encounter (Signed)
Discussed with Jorja Loa PA will increase metoprolol to 25mg  BID and decrease losartan to 25mg  daily. Follow up made to assess response. Pt will call if issues.

## 2023-01-15 NOTE — Telephone Encounter (Signed)
Pt has also called into the office.  Please see phone encounter for more details.

## 2023-01-19 DIAGNOSIS — G4733 Obstructive sleep apnea (adult) (pediatric): Secondary | ICD-10-CM | POA: Diagnosis not present

## 2023-01-20 ENCOUNTER — Ambulatory Visit: Payer: 59 | Attending: Cardiology | Admitting: Cardiology

## 2023-01-20 ENCOUNTER — Encounter: Payer: Self-pay | Admitting: Cardiology

## 2023-01-20 VITALS — BP 138/74 | HR 106 | Ht 65.0 in | Wt 209.8 lb

## 2023-01-20 DIAGNOSIS — I480 Paroxysmal atrial fibrillation: Secondary | ICD-10-CM | POA: Diagnosis not present

## 2023-01-20 DIAGNOSIS — I25708 Atherosclerosis of coronary artery bypass graft(s), unspecified, with other forms of angina pectoris: Secondary | ICD-10-CM

## 2023-01-20 DIAGNOSIS — Z952 Presence of prosthetic heart valve: Secondary | ICD-10-CM | POA: Diagnosis not present

## 2023-01-20 DIAGNOSIS — I4819 Other persistent atrial fibrillation: Secondary | ICD-10-CM | POA: Diagnosis not present

## 2023-01-20 DIAGNOSIS — I10 Essential (primary) hypertension: Secondary | ICD-10-CM

## 2023-01-20 DIAGNOSIS — D6869 Other thrombophilia: Secondary | ICD-10-CM | POA: Diagnosis not present

## 2023-01-20 DIAGNOSIS — R000 Tachycardia, unspecified: Secondary | ICD-10-CM | POA: Diagnosis not present

## 2023-01-20 MED ORDER — METOPROLOL SUCCINATE ER 25 MG PO TB24: ORAL_TABLET | ORAL | 0 refills | Status: DC

## 2023-01-20 MED ORDER — METOPROLOL SUCCINATE ER 25 MG PO TB24: 25.0000 mg | ORAL_TABLET | Freq: Two times a day (BID) | ORAL | 0 refills | Status: DC

## 2023-01-20 NOTE — Patient Instructions (Addendum)
Medication Instructions:  Increase Metoprolol Succinate ER 50 mg (2 tablets) in the morning and 25 mg (1 tablet) at night. *If you need a refill on your cardiac medications before your next appointment, please call your pharmacy*  Lab Work: Today we will draw Bmet, CBC, Mag, and TSH If you have labs (blood work) drawn today and your tests are completely normal, you will receive your results only by: MyChart Message (if you have MyChart) OR A paper copy in the mail If you have any lab test that is abnormal or we need to change your treatment, we will call you to review the results.  Testing/Procedures: No testing  Follow-Up: At [facility], you and your health needs are our priority.  As part of our continuing mission to provide you with exceptional heart care, we have created designated Provider Care Teams.  These Care Teams include your primary Cardiologist (physician) and Advanced Practice Providers (APPs -  Physician Assistants and Nurse Practitioners) who all work together to provide you with the care you need, when you need it.  We recommend signing up for the patient portal called "MyChart".  Sign up information is provided on this After Visit Summary.  MyChart is used to connect with patients for Virtual Visits (Telemedicine).  Patients are able to view lab/test results, encounter notes, upcoming appointments, etc.  Non-urgent messages can be sent to your provider as well.   To learn more about what you can do with MyChart, go to [email or web address].    Your next appointment:   Next Available  Provider:   [name], MD

## 2023-01-20 NOTE — Telephone Encounter (Signed)
Spoke to patient . Appointment schedule for today at 2 pm   Patient states  heart rate   fluctuate  from  mid 100's  to low 100 .

## 2023-01-20 NOTE — Progress Notes (Signed)
Cardiology Office Note    Date:  01/20/2023  ID:  Kimberly, Hobbs 14-Jul-1960, MRN [id number] PCP:  [name], PA  Cardiologist:  Thomasene Ripple, DO  Electrophysiologist:  Regan Lemming, MD   Chief Complaint: Atrial fibrillation   History of Present Illness: .    Kimberly Hobbs is a 62 y.o. 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   She underwent cardiac catheterization in 2020 which showed patent grafts. She was previously followed by [place] [facility] and has been followed by Dr. Elberta Fortis for EP.  She has undergone cardioversions in 04/09/2022 and 05/08/2022, she underwent A-fib ablation with Dr. Elberta Fortis on 09/05/2022.   She requested transfer from [place] [facility] to [facility] in order to establish with a female cardiologist.  She first saw Dr. Servando Salina on 12/27/22, she reported intermittent episodes of A-fib which would occur sporadically.  She is to have repeat echo in 1 year.  She was last seen by Dr. Elberta Fortis on 01/06/2023, her amiodarone was discontinued.  Patient recently wore a cardiac monitor for 3 days and 2 hours which showed an average heart rate of 62 bpm, max heart rate of 130 bpm minimum heart rate 48 bpm, predominant underlying rhythm was sinus rhythm.  Today she presents for an acute visit reporting that she has been in atrial fibrillation.  She notes feelings of fatigue similar to her prior episodes of atrial fibrillation with occasional palpitations.  She denies chest pain, shortness of breath, orthopnea or PND.  ROS: .   Today she denies chest pain, shortness of breath, melena, hematuria, hemoptysis, diaphoresis, weakness, presyncope, syncope, orthopnea, and PND.  All other systems  are reviewed and otherwise negative. Studies Reviewed: Marland Kitchen    EKG:  EKG is ordered today, personally reviewed, demonstrating  EKG Interpretation Date/Time:  Monday January 20 2023 14:14:48 EST Ventricular Rate:  122 PR Interval:  114 QRS Duration:  116 QT Interval:  362 QTC Calculation: 515 R Axis:   -69  Text Interpretation: Sinus tachycardia Left axis deviation Minimal voltage criteria for LVH, may be normal variant ( Cornell product ) When compared with ECG of 06-Jan-2023 15:13, Vent. rate has increased BY  58 BPM Confirmed by Reather Littler [phone]) on 01/20/2023 3:16:32 PM   CV Studies:  Cardiac Studies & Procedures   CARDIAC CATHETERIZATION  CARDIAC CATHETERIZATION 08/18/2018  Narrative RCA: Prox 60% stenosis, mid 30-40% stenoses. TIMI III flow in prox-mid RCA. 100% distal occlusion SVG-RCA: Patent. Ostial 40%. Distal RCA moderate diffuse disease.  Guide catheter angiography provided superior images. There is TIMI III flow in SVG which fills RCA all the way to the ostium. Even is she were to have FFR positive lesion in the graft, I do not think the benefits of a stent would outweigh the risk of losing the graft and the entire RCA territory circulation in a borderline lesion. Also, the prox RCA lesion is well bypassed by the SVG graft. Thus, I decided not to perform FFR/PCI to either of these lesions. Continue medical management.  Elder Negus, MD [facility]. [place] Pager: [phone] Office: [phone] If no answer Cell [phone]  Findings Coronary Findings Diagnostic  Dominance: Right  Right Coronary Artery Prox RCA lesion is 60% stenosed. [place] was only used  for guid catheter engagement. No intervention was performed. Mid RCA lesion is 40% stenosed. Dist RCA lesion is 40% stenosed.  Right Posterior Atrioventricular Artery RPAV lesion is 50% stenosed. RPAV lesion is 40% stenosed.  Saphenous Graft To Dist RCA SVG and is normal in caliber. The  graft exhibits no disease. Moderate diffuse disease in small native RCA distal to the graft Origin to Prox Graft lesion is 40% stenosed.  Intervention  No interventions have been documented.   CARDIAC CATHETERIZATION  CARDIAC CATHETERIZATION 07/21/2018  Narrative LM: Normal LAD: 100% mid occlusion. Mild distal disease LIMA-LAD: Patent LCx: Normal RCA: Prox 70%-80% stenosis, mid 30-40% stenoses. TIMI III flow in prox-mid RCA. 100% distal occlusion SVG-RCA: Patent. Ostial 75% stenosis. Distal RCA moderate diffuse disease.  Patent grafts. Moderate to severe native RCA disease.  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  Lesions in SVG ostium and prox RCA were better appreciated on further review after the case. I discussed the findings with the patient. Given her elevated blood pressures and filling pressures, recommend aggressive medical management at this time. Added spironolactone. Will bring her back for elective PCI to prox RCA +/- SVG-RCA ostium through right radial access on June 9. Will check BMP, COVID test on 06/05  Resume warfarin today and hold June 7 and June 8.  Elder Negus, MD [facility] [place] Pager: [phone] Office: [phone] If no answer Cell [phone]  Findings Coronary Findings Diagnostic  Dominance: Right  Left Main Vessel is normal in caliber. Vessel is angiographically normal.  Left Anterior Descending Mid LAD lesion is 100% stenosed. Mid LAD to Dist LAD lesion is 20% stenosed.  Left Circumflex Vessel is normal in caliber. Vessel is angiographically normal.  Right Coronary Artery Prox RCA lesion is 70% stenosed. Mid RCA lesion is 40% stenosed. Dist RCA lesion is 40% stenosed.  Right Posterior Atrioventricular Artery Post Atrio lesion is 40% stenosed. Post Atrio lesion is 40% stenosed.  LIMA Graft To Mid LAD graft was  visualized by angiography and is normal in caliber. The graft exhibits no disease. Mild diffuse disease in LAD distal to the graft.  Saphenous Graft To Dist RCA SVG and is normal in caliber. The graft exhibits no disease. Moderate diffuse disease in small native RCA distal to the graft Origin to Prox Graft lesion is 80% stenosed.  Intervention  No interventions have been documented.     ECHOCARDIOGRAM  PCV ECHOCARDIOGRAM COMPLETE 04/05/2022  Narrative Echocardiogram 04/05/2022: Mildly depressed LV systolic function with EF 53%. Left ventricle cavity is normal in size. Normal global wall motion. Calculated EF 53%. Mechanical trileaflet aortic valve.  No aortic valve regurgitation. AVA (VTI) measures 1.4 cm^2. AV Mean Grad measures 20.6 mmHg. AV Pk Vel measures 3.39 m/s. Mechanical mitral valve.  No mitral valve regurgitation. E-wave dominant mitral inflow. Structurally normal tricuspid valve.  Mild tricuspid regurgitation. RVSP measures 35 mmHg. IVC is dilated with respiratory variation. Normally functioning prosthetic valves.   TEE  ECHO TEE 07/21/2018  Narrative TRANSESOPHOGEAL ECHO REPORT    Patient Name:   Kimberly Hobbs Date of Exam: 07/21/2018 Medical Rec #:  [id number]     Height:       64.0 in Accession #:    [id number]    Weight:       230.0 lb Date of Birth:  12-Jul-1960      BSA:          2.08 m Patient Age:  57 years      BP:           120/45 mmHg Patient Gender: F             HR:           66 bpm. Exam Location:  Outpatient   Procedure: Transesophageal Echo, Cardiac Doppler and Color Doppler  Indications:     mitral valve disorder  History:         Patient has prior history of Echocardiogram examinations, most recent 07/27/2015. Aortic Valve: A 19mm St. Jude mechanical aortic valve prosthesis valve is present in the aortic position. Procedure Date: 2018 Mitral Valve: A 25 mm Regent mechanical valve is present in the mitral position. Procedure Date:  2018.  Sonographer:     Delcie Roch RDCS Referring Phys:  [id number] [facility] J [name] Diagnosing Phys: Truett Mainland MD    PROCEDURE: After discussion of the risks and benefits of a TEE, an informed consent was obtained from the patient. Patients was monitored while under deep sedation. The transesophogeal probe was passed through the esophogus of the patient. Image quality was good. The patient developed no complications during the procedure.  IMPRESSIONS   1. The left ventricle has normal systolic function, with an ejection fraction of 55-60%. There is abnormal septal motion consistent with post-operative status. 2. The right ventricle has normal systolc function. 3. A 25 mm Regent mechanical valve is present in the mitral position. Procedure Date: 2018 Echo findings are consistent with is functioning normally the mitral prosthesis. Normal mitral valve prosthesis. Mean PG 6 mmHg, MVA 1.7 cm2. No thrombus seen. (Patient known to have subtherapeutic INR). 4. A 19mm St. Jude mechanical prosthesis valve is present in the aortic position. Procedure Date: 2018 Normal aortic valve prosthesis. Echo findings shows no evidence of Accelration time of 88 msec suggests normal functioning valve. Mean PG 25 mmHg likely due to small annular diameter. No prosthetic valve stenosis noted. of the aortic prosthesis. No thrombus seen. (Patient known to have subtherapeutic INR).  FINDINGS Left Ventricle: The left ventricle has normal systolic function, with an ejection fraction of 55-60%. There is abnormal (paradoxical) septal motion consistent with post-operative status.  Right Ventricle: The right ventricle has normal systolic function.  Left Atrium: Left atrial size was normal in size.   Right Atrium: Right atrial size was normal in size. Right atrial pressure is estimated at 0 mmHg.  Pericardium: There is no evidence of pericardial effusion.  Mitral Valve: A 25 mm Regent mechanical valve is  present in the mitral position. Procedure Date: 2018 Echo findings are consistent with normal function of the mitral prosthesis. Normal mitral valve prosthesis. Mean PG 6 mmHg, MVA 1.7 cm2. No thrombus seen. (Patient known to have subtherapeutic INR).  Tricuspid Valve: Tricuspid valve regurgitation is mild by color flow Doppler.  Aortic Valve: A 19mm St. Jude mechanical aortic valve prosthesis valve is present in the aortic position. Procedure Date: 2018 Normal aortic valve prosthesis. Echo findings shows no evidence of Accelration time of 88 msec suggests normal functioning valve. Mean PG 25 mmHg likely due to small annular diameter. No prosthetic valve stenosis noted. of the aortic prosthesis. No thrombus seen. (Patient known to have subtherapeutic INR).  Pulmonic Valve: The pulmonic valve was grossly normal. Pulmonic valve regurgitation is not visualized by color flow Doppler.   +-------------+------------++ AORTIC VALVE              +-------------+------------++ AV Vmax:     332.00 cm/s  +-------------+------------++  AV Vmean:    232.000 cm/s +-------------+------------++ AV VTI:      0.790 m      +-------------+------------++ AV Peak Grad:44.1 mmHg    +-------------+------------++ AV Mean Grad:25.0 mmHg    +-------------+------------++  +--------------+-----------++ MITRAL VALVE              +--------------+-----------++ MV Area (PHT):1.77 cm    +--------------+-----------++ MV Peak grad: 17.1 mmHg   +--------------+-----------++ MV Mean grad: 5.5 mmHg    +--------------+-----------++ MV Vmax:      2.07 m/s    +--------------+-----------++ MV Vmean:     100.9 cm/s  +--------------+-----------++ MV VTI:       0.60 m      +--------------+-----------++ MV PHT:       124.00 msec +--------------+-----------++   Truett Mainland MD Electronically signed by Truett Mainland MD Signature Date/Time: 07/21/2018/3:09:51  PM    Final   MONITORS  LONG TERM MONITOR (3-14 DAYS) 01/10/2023  Narrative Patch Wear Time:  3 days and 2 hours (2024-11-15T12:42:14-0500 to 2024-11-18T15:17:38-0500)  Patient had a min HR of 48 bpm, max HR of 130 bpm, and avg HR of 62 bpm. Predominant underlying rhythm was Sinus Rhythm. Isolated SVEs were rare (<1.0%), SVE Couplets were rare (<1.0%), and no SVE Triplets were present. Isolated VEs were rare (<1.0%), and no VE Couplets or VE Triplets were present.  No symptoms reported  Conclusion: Normal/unremarkable study.           Current Reported Medications:.    Current Meds  Medication Sig   Accu-Chek Softclix Lancets lancets Use as instructed (Patient taking differently: 1 each by Other route See admin instructions. Use as instructed)   aspirin EC 81 MG tablet Take 81 mg by mouth daily.    Blood Glucose Monitoring Suppl (ACCU-CHEK GUIDE) w/Device KIT Check glucose every morning (Patient taking differently: 1 each by Other route See admin instructions. Check glucose every morning)   clonazePAM (KLONOPIN) 0.5 MG tablet Take 1.5 tablets (0.75 mg total) by mouth at bedtime.   cyclobenzaprine (FLEXERIL) 10 MG tablet Take 1 tablet (10 mg total) by mouth at bedtime as needed for muscle spasms.   dapagliflozin propanediol (FARXIGA) 5 MG TABS tablet Take 1 tablet (5 mg total) by mouth daily.   diclofenac Sodium (VOLTAREN ARTHRITIS PAIN) 1 % GEL Apply 4 g topically 4 (four) times daily. (Patient taking differently: Apply 4 g topically 4 (four) times daily as needed (pain).)   diltiazem (CARDIZEM CD) 180 MG 24 hr capsule TAKE 1 CAPSULE BY MOUTH DAILY   furosemide (LASIX) 20 MG tablet TAKE 2 TABLETS(40 MG) BY MOUTH TWICE DAILY (Patient taking differently: Take 40 mg by mouth 2 (two) times daily.)   gabapentin (NEURONTIN) 100 MG capsule Take 1 capsule (100 mg total) by mouth at bedtime.   glucose blood (ACCU-CHEK GUIDE) test strip Use as instructed (Patient taking differently: 1 each by  Other route as needed for other (Glucose chesk). Use as instructed)   HYDROcodone-acetaminophen (NORCO) 7.5-325 MG tablet Take 1 tablet by mouth every 12 (twelve) hours as needed for moderate pain (pain score 4-6).   ipratropium (ATROVENT) 0.03 % nasal spray Place 1 spray into both nostrils 2 (two) times daily as needed for rhinitis.   isosorbide mononitrate (IMDUR) 60 MG 24 hr tablet Take 1 tablet (60 mg total) by mouth daily.   losartan (COZAAR) 50 MG tablet Take 0.5 tablets (25 mg total) by mouth daily.   metFORMIN (GLUCOPHAGE) 500 MG tablet Take 1 tablet (500 mg total) by  mouth 2 (two) times daily with a meal.   montelukast (SINGULAIR) 10 MG tablet Take 1 tablet (10 mg total) by mouth daily.   nitroGLYCERIN (NITROSTAT) 0.4 MG SL tablet DISSOLVE 1 TABLET UNDER THE TONGUE EVERY 5 MINUTES FOR 3 DOSES AS NEEDED FOR CHEST PAIN (Patient taking differently: Place 0.4 mg under the tongue every 5 (five) minutes as needed for chest pain.)   OVER THE COUNTER MEDICATION Take 1 tablet by mouth daily. [employer] Iron blood builder   pantoprazole (PROTONIX) 40 MG tablet Take 1 tablet (40 mg total) by mouth 2 (two) times daily before a meal. (Patient taking differently: Take 40 mg by mouth daily.)   phenazopyridine (PYRIDIUM) 100 MG tablet Take 100 mg by mouth 3 (three) times daily.   potassium chloride SA (KLOR-CON M) 20 MEQ tablet Take 1 tablet (20 mEq total) by mouth 2 (two) times daily. (Patient taking differently: Take 40 mEq by mouth daily.)   rosuvastatin (CRESTOR) 40 MG tablet Take 1 tablet (40 mg total) by mouth daily.   spironolactone (ALDACTONE) 50 MG tablet Take 1 tablet (50 mg total) by mouth daily.   tiotropium (SPIRIVA HANDIHALER) 18 MCG inhalation capsule Place 18 mcg into inhaler and inhale daily.   Tiotropium Bromide-Olodaterol (STIOLTO RESPIMAT) 2.5-2.5 MCG/ACT AERS Inhale 2 puffs into the lungs daily.   traZODone (DESYREL) 50 MG tablet Take 50 mg by mouth at bedtime.   warfarin (COUMADIN) 5  MG tablet Take 7.5 mg on Monday and Wed and 5 mg all other days (Patient taking differently: Take 5 mg by mouth See admin instructions. Take Thursday, Friday, Saturday and Sunday at bedtime)   warfarin (COUMADIN) 7.5 MG tablet Take 7.5 mg on Monday and Wednesday and 5 mg all other days (Patient taking differently: Take 7.5 mg by mouth See admin instructions. Take 7.5 mg on Monday, Tuesday and Wednesday at bedtime)   [DISCONTINUED] metoprolol succinate (TOPROL XL) 25 MG 24 hr tablet Take 1 tablet (25 mg total) by mouth in the morning and at bedtime.   [DISCONTINUED] metoprolol succinate (TOPROL-XL) 25 MG 24 hr tablet Take 1 tablet (25 mg total) by mouth 2 (two) times daily.   Physical Exam:    VS:  BP 138/74   Pulse (!) 106   Ht 5\' 5"  (1.651 m)   Wt 209 lb 12.8 oz (95.2 kg)   LMP 06/20/2015   SpO2 98%   BMI 34.91 kg/m    Wt Readings from Last 3 Encounters:  01/20/23 209 lb 12.8 oz (95.2 kg)  01/07/23 211 lb (95.7 kg)  01/06/23 211 lb (95.7 kg)    GEN: Well nourished, well developed in no acute distress NECK: No JVD; No carotid bruits CARDIAC: Fast RRR, mechanical click, no rubs or gallops RESPIRATORY:  Clear to auscultation without rales, wheezing or rhonchi  ABDOMEN: Soft, non-tender, non-distended EXTREMITIES:  No edema; No acute deformity   Asessement and Plan:.    Persistent atrial fibrillation/tachycardia: Followed by EP and afib clinic. S/p DCCV in 03/2022 and 04/2022, afib ablation with Dr. Elberta Fortis on 09/05/22. Amiodarone recently discontinued. Patient contacted our office on 01/14/23 with concern for recurrent atrial fibrillation, her metoprolol succinate was increased to 25 mg twice a day. Today her EKG appears to show sinus tachycardia at 122 bpm though cannot definitively rule out atrial flutter. Reviewed EKG with Dr. Servando Salina, DOD at [facility] and patients primary cardiologist who felt EKG rhythm represented sinus tachycardia and recommended increasing metoprolol succinate.  Patient  denies chest pain or increased  shortness of breath, she notes that her main symptom is fatigue, blood pressure stable. Will increase her metoprolol succinate to 50 mg in the morning and 25 mg at night.  Reviewed ED precautions with patient. Check CBC, BMET, Mag and TSH. Will send EKG to Dr. Elberta Fortis for further review and recommendations. Continue Cardizem 180 mg daily. Continue coumadin.  ADDENDUM: Per Dr. Elberta Fortis "Agree with follow up in af clinic, thanks."   Hypertension: Blood pressure today 138/74. Increasing metoprolol succinate as noted above. She will continue to monitor at home. Continue current antihypertensive regimen.   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 Stable with no anginal symptoms. No indication for ischemic evaluation.  On coumadin.     Disposition: F/u with Afib clinic on 02/04/23 and Dr. Elberta Fortis at next available appointment.   Signed, Rip Harbour, NP

## 2023-01-21 ENCOUNTER — Telehealth: Payer: Self-pay

## 2023-01-21 LAB — BASIC METABOLIC PANEL
BUN/Creatinine Ratio: 20 (ref 12–28)
BUN: 29 mg/dL — ABNORMAL HIGH (ref 8–27)
CO2: 23 mmol/L (ref 20–29)
Calcium: 10 mg/dL (ref 8.7–10.3)
Chloride: 99 mmol/L (ref 96–106)
Creatinine, Ser: 1.42 mg/dL — ABNORMAL HIGH (ref 0.57–1.00)
Glucose: 94 mg/dL (ref 70–99)
Potassium: 4.8 mmol/L (ref 3.5–5.2)
Sodium: 140 mmol/L (ref 134–144)
eGFR: 42 mL/min/{1.73_m2} — ABNORMAL LOW (ref >59)

## 2023-01-21 LAB — TSH: TSH: 3.09 u[IU]/mL (ref 0.450–4.500)

## 2023-01-21 LAB — CBC
Hematocrit: 39.8 % (ref 34.0–46.6)
Hemoglobin: 13.3 g/dL (ref 11.1–15.9)
MCH: 29.9 pg (ref 26.6–33.0)
MCHC: 33.4 g/dL (ref 31.5–35.7)
MCV: 89 fL (ref 79–97)
Platelets: 283 10*3/uL (ref 150–450)
RBC: 4.45 x10E6/uL (ref 3.77–5.28)
RDW: 15.2 % (ref 11.7–15.4)
WBC: 10.9 10*3/uL — ABNORMAL HIGH (ref 3.4–10.8)

## 2023-01-21 LAB — MAGNESIUM: Magnesium: 1.9 mg/dL (ref 1.6–2.3)

## 2023-01-21 MED ORDER — MAGNESIUM OXIDE 400 MG PO CAPS: 400.0000 mg | ORAL_CAPSULE | Freq: Every day | ORAL | 0 refills | Status: DC

## 2023-01-21 NOTE — Telephone Encounter (Signed)
Called patient advised of below they verbalized understanding Sent medication to pharmacy

## 2023-01-21 NOTE — Telephone Encounter (Signed)
-----   Message from Rip Harbour sent at 01/21/2023 10:05 AM EST ----- Please let Kimberly Hobbs know that her kidney function is at her baseline, her BUN is slightly elevated which could indicate some mild dehydration and make her heart rate faster, her kidney function itself would not cause her increased heart rate. Her potassium is normal.  Her CBC shows a very slight elevation in her white blood cell count, if she starts to notice any signs of infection such as fever, chills or congestion would recommend following up with her PCP regarding this.  There is no evidence of anemia. Her thyroid function is normal. Her magnesium level is very slightly low, would recommend starting magnesium oxide 400 mg daily for one week.

## 2023-01-22 NOTE — Telephone Encounter (Addendum)
Dr. Elberta Fortis called me about pt and getting her seen. Pt aware per MD she was misdiagnosed 2 days ago - she was in AFLutter, NOT AFib. Pt's SBP are currently in the 90s - will not change medication at this time per MD. Pt scheduled to see AFib clinic on Friday to discuss DCCV. Pt agreeable to plan.

## 2023-01-23 ENCOUNTER — Encounter: Payer: Self-pay | Admitting: Family Medicine

## 2023-01-23 ENCOUNTER — Ambulatory Visit (INDEPENDENT_AMBULATORY_CARE_PROVIDER_SITE_OTHER): Payer: 59 | Admitting: Family Medicine

## 2023-01-23 VITALS — BP 115/68 | HR 131 | Resp 18 | Ht 65.0 in | Wt 212.0 lb

## 2023-01-23 DIAGNOSIS — R82998 Other abnormal findings in urine: Secondary | ICD-10-CM

## 2023-01-23 DIAGNOSIS — R319 Hematuria, unspecified: Secondary | ICD-10-CM | POA: Diagnosis not present

## 2023-01-23 DIAGNOSIS — F411 Generalized anxiety disorder: Secondary | ICD-10-CM | POA: Diagnosis not present

## 2023-01-23 DIAGNOSIS — R300 Dysuria: Secondary | ICD-10-CM

## 2023-01-23 DIAGNOSIS — F5104 Psychophysiologic insomnia: Secondary | ICD-10-CM | POA: Insufficient documentation

## 2023-01-23 LAB — POCT URINALYSIS DIP (CLINITEK)
Bilirubin, UA: NEGATIVE
Glucose, UA: NEGATIVE mg/dL
Ketones, POC UA: NEGATIVE mg/dL
Nitrite, UA: NEGATIVE
POC PROTEIN,UA: NEGATIVE
Spec Grav, UA: 1.01 (ref 1.010–1.025)
Urobilinogen, UA: 0.2 U/dL
pH, UA: 5.5 (ref 5.0–8.0)

## 2023-01-23 MED ORDER — TRAZODONE HCL 50 MG PO TABS: 25.0000 mg | ORAL_TABLET | Freq: Every day | ORAL | 0 refills | Status: DC

## 2023-01-23 MED ORDER — CLONAZEPAM 0.5 MG PO TABS: 0.7500 mg | ORAL_TABLET | Freq: Every day | ORAL | 0 refills | Status: DC

## 2023-01-23 NOTE — Assessment & Plan Note (Signed)
Trial of low-dose trazodone 25 mg nightly.  In the future, if ineffective consider Seroquel.

## 2023-01-23 NOTE — Assessment & Plan Note (Addendum)
Provided number for [facility] where referral was sent on 01/06/2023.  Also provided the website psychology today find a therapist if she would like to pursue alternative options.  She would benefit from consulting with psychiatry for management of anxiety and insomnia.  Sent refill of Klonopin to use as needed for breakthrough anxiety until an appointment with psychiatry can be scheduled.  May consider Seroquel in the future if she is not able to establish with psychiatry soon.

## 2023-01-23 NOTE — Progress Notes (Signed)
Acute Office Visit  Subjective:     Patient ID: Kimberly Hobbs, female    DOB: April 19, 1960, 62 y.o.   MRN: [id number]  Chief Complaint  Patient presents with   Dysuria    HPI Patient is in today for urinary symptoms.  She denies pain or burning with urination, changes in urine color or odor.  She describes a pressure "down there".  Denies fever, chills, abdominal pain, nausea/vomiting.  She has not tried over-the-counter remedies like cranberry juice or Azo.  Of note, she is currently on dapagliflozin 5 mg daily.  She is also requesting referral to therapy due to elevated stress and anxiety.  This is also affecting her sleep, she would like to try trazodone again.  She believes that in the past it caused sleepwalking, willing to try at low-dose.  She did not respond well to mirtazapine in the past and would like to avoid Ambien.  ROS Negative unless otherwise noted in HPI    Objective:    BP 115/68 (BP Location: Left Arm, Patient Position: Sitting, Cuff Size: Normal)   Pulse (!) 131   Resp 18   Ht 5\' 5"  (1.651 m)   Wt 212 lb (96.2 kg)   LMP 06/20/2015   SpO2 96%   BMI 35.28 kg/m   Physical Exam Constitutional:      General: She is not in acute distress.    Appearance: Normal appearance.  HENT:     Head: Normocephalic and atraumatic.  Pulmonary:     Effort: Pulmonary effort is normal. No respiratory distress.  Musculoskeletal:     Cervical back: Normal range of motion.  Neurological:     General: No focal deficit present.     Mental Status: She is alert and oriented to person, place, and time. Mental status is at baseline.  Psychiatric:        Mood and Affect: Mood normal.        Thought Content: Thought content normal.        Judgment: Judgment normal.    Results for orders placed or performed in visit on 01/23/23  POCT URINALYSIS DIP (CLINITEK)  Result Value Ref Range   Color, UA light yellow (A) yellow   Clarity, UA clear clear   Glucose, UA negative  negative mg/dL   Bilirubin, UA negative negative   Ketones, POC UA negative negative mg/dL   Spec Grav, UA 5.366 4.403 - 1.025   Blood, UA trace-intact (A) negative   pH, UA 5.5 5.0 - 8.0   POC PROTEIN,UA negative negative, trace   Urobilinogen, UA 0.2 0.2 or 1.0 E.U./dL   Nitrite, UA Negative Negative   Leukocytes, UA Small (1+) (A) Negative      Assessment & Plan:  Dysuria -     POCT URINALYSIS DIP (CLINITEK) -     Urine Culture  Hematuria, unspecified type -     Urine Culture  Leukocytes in urine -     Urine Culture  GAD (generalized anxiety disorder) Assessment & Plan: Provided number for [facility] where referral was sent on 01/06/2023.  Also provided the website psychology today find a therapist if she would like to pursue alternative options.  She would benefit from consulting with psychiatry for management of anxiety and insomnia.  Sent refill of Klonopin to use as needed for breakthrough anxiety until an appointment with psychiatry can be scheduled.  May consider Seroquel in the future if she is not able to establish with psychiatry soon.  Orders: -     clonazePAM; Take 1.5 tablets (0.75 mg total) by mouth at bedtime.  Dispense: 45 tablet; Refill: 0  Psychophysiological insomnia Assessment & Plan: Trial of low-dose trazodone 25 mg nightly.  In the future, if ineffective consider Seroquel.  Orders: -     traZODone HCl; Take 0.5-1 tablets (25-50 mg total) by mouth at bedtime.  Dispense: 5 tablet; Refill: 0  Nitrites negative, small amounts of blood and leukocytes so starting with culture prior to antibiotics.  In the meantime, recommend increasing fluid intake, cranberry juice with no sugar, Azo for symptomatic relief.  May consider discontinuing dapagliflozin despite benefits for CKD.  Return if symptoms worsen or fail to improve.  [name], PA

## 2023-01-23 NOTE — Patient Instructions (Addendum)
A referral was previously sent to [facility] for you.  Give them a call to see if they have received the referral and schedule an appointment. [address], [address], [place], [place], [address] Phone: [phone]  I will try sending a referral to a different location as well. It is called [facility] psychologist for [employer]. You can also call them at [phone].  If you find out that they do not take your insurance, I recommend to go to the following website to find a therapist who takes your insurance: Psychology Today Find a Therapist They typically do not require a referral, but if you try to schedule and they tell you that they do need a referral just let me know and I can send one in.

## 2023-01-24 ENCOUNTER — Encounter: Payer: Self-pay | Admitting: Family Medicine

## 2023-01-24 ENCOUNTER — Encounter (HOSPITAL_COMMUNITY): Payer: Self-pay | Admitting: Physician Assistant

## 2023-01-24 ENCOUNTER — Ambulatory Visit (HOSPITAL_COMMUNITY)
Admission: RE | Admit: 2023-01-24 | Discharge: 2023-01-24 | Disposition: A | Discharge status: Home / Self Care | Payer: 59 | Source: Ambulatory Visit | Attending: Physician Assistant | Admitting: Physician Assistant

## 2023-01-24 VITALS — BP 90/58 | HR 82 | Ht 65.0 in | Wt 208.6 lb

## 2023-01-24 DIAGNOSIS — F5104 Psychophysiologic insomnia: Secondary | ICD-10-CM

## 2023-01-24 DIAGNOSIS — Z6834 Body mass index (BMI) 34.0-34.9, adult: Secondary | ICD-10-CM | POA: Diagnosis not present

## 2023-01-24 DIAGNOSIS — I4819 Other persistent atrial fibrillation: Secondary | ICD-10-CM | POA: Diagnosis not present

## 2023-01-24 DIAGNOSIS — E669 Obesity, unspecified: Secondary | ICD-10-CM | POA: Diagnosis not present

## 2023-01-24 DIAGNOSIS — Z952 Presence of prosthetic heart valve: Secondary | ICD-10-CM | POA: Insufficient documentation

## 2023-01-24 DIAGNOSIS — Z951 Presence of aortocoronary bypass graft: Secondary | ICD-10-CM | POA: Diagnosis not present

## 2023-01-24 DIAGNOSIS — I5032 Chronic diastolic (congestive) heart failure: Secondary | ICD-10-CM | POA: Insufficient documentation

## 2023-01-24 DIAGNOSIS — I4892 Unspecified atrial flutter: Secondary | ICD-10-CM | POA: Diagnosis not present

## 2023-01-24 DIAGNOSIS — I2510 Atherosclerotic heart disease of native coronary artery without angina pectoris: Secondary | ICD-10-CM | POA: Diagnosis not present

## 2023-01-24 DIAGNOSIS — I110 Hypertensive heart disease with heart failure: Secondary | ICD-10-CM | POA: Insufficient documentation

## 2023-01-24 DIAGNOSIS — Z7901 Long term (current) use of anticoagulants: Secondary | ICD-10-CM | POA: Diagnosis not present

## 2023-01-24 DIAGNOSIS — G4733 Obstructive sleep apnea (adult) (pediatric): Secondary | ICD-10-CM | POA: Diagnosis not present

## 2023-01-24 DIAGNOSIS — F411 Generalized anxiety disorder: Secondary | ICD-10-CM

## 2023-01-24 DIAGNOSIS — D6869 Other thrombophilia: Secondary | ICD-10-CM | POA: Insufficient documentation

## 2023-01-24 DIAGNOSIS — F320 Major depressive disorder, single episode, mild: Secondary | ICD-10-CM

## 2023-01-24 MED ORDER — VORTIOXETINE HBR 5 MG PO TABS: 5.0000 mg | ORAL_TABLET | Freq: Every day | ORAL | 2 refills | Status: DC

## 2023-01-24 NOTE — Progress Notes (Signed)
Primary Care Physician: [name], PA Primary Cardiologist: Thomasene Ripple, DO Electrophysiologist: Regan Lemming, MD  Referring Physician: Dr Zada Girt Kimberly Hobbs is a 62 y.o. 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 She was admitted to the hospital March 2024 with acute on chronic diastolic heart failure and rapid atrial fibrillation. She had previously failed amiodarone. She was seen by Dr Elberta Fortis and underwent afib ablation on 09/05/22. Patient is on warfarin for a CHADS2VASC score of 5.  She has had recurrent issues with atrial flutter. Seen by Reather Littler 01/20/23 and her metoprolol was increased.   On follow up today, patient reports that she converted spontaneously to SR this AM. She is feel well now. No bleeding issues on anticoagulation.   Today, she denies symptoms of chest pain, shortness of breath, orthopnea, PND, lower extremity edema, dizziness, presyncope, syncope, bleeding, or neurologic sequela. The patient is tolerating medications without difficulties and is otherwise without complaint today.    Atrial Fibrillation Risk Factors:  she does have symptoms or diagnosis of sleep apnea. she does have a history of rheumatic fever.   Atrial Fibrillation Management history:  Previous antiarrhythmic drugs: amiodarone Previous cardioversions: 04/02/22, 04/27/22 Previous ablations: 09/05/22 Anticoagulation history: warfarin   ROS- All systems are reviewed and negative except as per the HPI above.  Past Medical History:  Diagnosis Date   Allergy ?   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 Use as instructed) 100 each 12   aspirin EC 81 MG tablet Take 81 mg by mouth daily.      Blood Glucose Monitoring Suppl (ACCU-CHEK GUIDE) w/Device KIT Check glucose every morning (Patient taking differently: 1 each by Other route See admin instructions. Check glucose every morning) 1 kit 0   clonazePAM (KLONOPIN) 0.5 MG tablet Take 1.5 tablets (0.75 mg total) by mouth at bedtime. 45 tablet 0   cyclobenzaprine (FLEXERIL) 10 MG tablet Take 1 tablet (10 mg total) by mouth at bedtime as needed for muscle spasms. 30 tablet 3   dapagliflozin propanediol (FARXIGA) 5 MG TABS tablet Take 1 tablet (5 mg total) by mouth daily. 30 tablet 3   diclofenac Sodium (VOLTAREN ARTHRITIS PAIN) 1 % GEL Apply 4 g topically 4 (four) times daily. (Patient taking differently: Apply 4 g topically 4 (four) times daily as needed (pain).) 150 g 5   furosemide (LASIX) 20 MG tablet TAKE 2 TABLETS(40 MG) BY MOUTH TWICE DAILY (Patient taking differently: Take 40 mg by mouth 2 (two) times daily.) 60  tablet 3   gabapentin (NEURONTIN) 100 MG capsule Take 1 capsule (100 mg total) by mouth at bedtime. 30 capsule 5   glucose blood (ACCU-CHEK GUIDE) test strip Use as instructed (Patient taking differently: 1 each by Other route as needed for other (Glucose chesk). Use as instructed) 100 each 12   HYDROcodone-acetaminophen (NORCO) 7.5-325 MG tablet Take 1  tablet by mouth every 12 (twelve) hours as needed for moderate pain (pain score 4-6). 60 tablet 0   ipratropium (ATROVENT) 0.03 % nasal spray Place 1 spray into both nostrils 2 (two) times daily as needed for rhinitis. 30 mL 0   isosorbide mononitrate (IMDUR) 60 MG 24 hr tablet Take 1 tablet (60 mg total) by mouth daily. 90 tablet 3   losartan (COZAAR) 50 MG tablet Take 0.5 tablets (25 mg total) by mouth daily.     Magnesium Oxide 400 MG CAPS Take 1 capsule (400 mg total) by mouth daily. 7 capsule 0   metFORMIN (GLUCOPHAGE) 500 MG tablet Take 1 tablet (500 mg total) by mouth 2 (two) times daily with a meal. 180 tablet 3   metoprolol succinate (TOPROL XL) 25 MG 24 hr tablet Take 50 mg (2 tablets) in the morning and 25 mg (1 tablet) at night. 225 tablet 0   montelukast (SINGULAIR) 10 MG tablet Take 1 tablet (10 mg total) by mouth daily. 30 tablet 11   nitroGLYCERIN (NITROSTAT) 0.4 MG SL tablet DISSOLVE 1 TABLET UNDER THE TONGUE EVERY 5 MINUTES FOR 3 DOSES AS NEEDED FOR CHEST PAIN (Patient taking differently: Place 0.4 mg under the tongue every 5 (five) minutes as needed for chest pain.) 25 tablet 3   OVER THE COUNTER MEDICATION Take 1 tablet by mouth daily. [employer] Iron blood builder     pantoprazole (PROTONIX) 40 MG tablet Take 1 tablet (40 mg total) by mouth 2 (two) times daily before a meal. (Patient taking differently: Take 40 mg by mouth daily.) 180 tablet 3   phenazopyridine (PYRIDIUM) 100 MG tablet Take 100 mg by mouth 3 (three) times daily.     potassium chloride SA (KLOR-CON M) 20 MEQ tablet Take 1 tablet (20 mEq total) by mouth 2 (two) times daily. (Patient taking differently: Take 40 mEq by mouth daily.) 180 tablet 3   rosuvastatin (CRESTOR) 40 MG tablet Take 1 tablet (40 mg total) by mouth daily. 90 tablet 1   spironolactone (ALDACTONE) 50 MG tablet Take 1 tablet (50 mg total) by mouth daily. 90 tablet 3   tiotropium (SPIRIVA HANDIHALER) 18 MCG inhalation capsule Place 18 mcg into inhaler  and inhale daily.     Tiotropium Bromide-Olodaterol (STIOLTO RESPIMAT) 2.5-2.5 MCG/ACT AERS Inhale 2 puffs into the lungs daily. 4 g 6   traZODone (DESYREL) 50 MG tablet Take 0.5-1 tablets (25-50 mg total) by mouth at bedtime. 5 tablet 0   warfarin (COUMADIN) 5 MG tablet Take 7.5 mg on Monday and Wed and 5 mg all other days (Patient taking differently: Take 5 mg by mouth See admin instructions. Take Thursday, Friday, Saturday and Sunday at bedtime) 90 tablet 3   warfarin (COUMADIN) 7.5 MG tablet Take 7.5 mg on Monday and Wednesday and 5 mg all other days (Patient taking differently: Take 7.5 mg by mouth See admin instructions. Take 7.5 mg on Monday, Tuesday and Wednesday at bedtime) 90 tablet 3   No current facility-administered medications for this encounter.    Physical Exam: BP (!) 90/58   Pulse 82   Ht 5\' 5"  (1.651 m)  Wt 94.6 kg   LMP 06/20/2015   BMI 34.71 kg/m   GEN: Well nourished, well developed in no acute distress NECK: No JVD; No carotid bruits CARDIAC: Regular rate and rhythm, no murmurs, rubs, gallops, mech click RESPIRATORY:  Clear to auscultation without rales, wheezing or rhonchi  ABDOMEN: Soft, non-tender, non-distended EXTREMITIES:  No edema; No deformity    Wt Readings from Last 3 Encounters:  01/24/23 94.6 kg  01/23/23 96.2 kg  01/20/23 95.2 kg     EKG today demonstrates  SR, IVCD Vent. rate 82 BPM PR interval 188 ms QRS duration 128 ms QT/QTcB 428/500 ms   Echo 04/05/22 demonstrated  Mildly depressed LV systolic function with EF 53%. Left ventricle cavity  is normal in size. Normal global wall motion. Calculated EF 53%.  Mechanical trileaflet aortic valve.  No aortic valve regurgitation. AVA  (VTI) measures 1.4 cm^2. AV Mean Grad measures 20.6 mmHg. AV Pk Vel  measures 3.39 m/s.  Mechanical mitral valve.  No mitral valve regurgitation. E-wave dominant  mitral inflow.  Structurally normal tricuspid valve.  Mild tricuspid regurgitation. RVSP   measures 35 mmHg.  IVC is dilated with respiratory variation.  Normally functioning prosthetic valves.    CHA2DS2-VASc Score = 5  The patient's score is based upon: CHF History: 1 HTN History: 1 Diabetes History: 1 Stroke History: 0 Vascular Disease History: 1 Age Score: 0 Gender Score: 1       ASSESSMENT AND PLAN: Persistent Atrial Fibrillation/atrial flutter The patient's CHA2DS2-VASc score is 5, indicating a 7.2% annual risk of stroke.   S/p afib ablation 09/05/22 with recurrence of atrial flutter We discussed rhythm control options today including dofetilide and repeat ablation with amiodarone bridge.  Patient would like to pursue dofetilide admission. Continue warfarin, will need 4 weekly INR levels prior to admission.  No recent benadryl use PharmD to screen medications. She will need to stop trazodone. She will also need to be off amiodarone for 3 months (stopped 11/18). QT is long today, ~480-490 in setting of IVCD. Historically, closer to 450 ms. She is aware that if her QT significantly prolongs the medication will need to be decreased or discontinued. Continue Toprol 50 mg AM and 25 mg PM  Secondary Hypercoagulable State (ICD10:  D68.69) The patient is at significant risk for stroke/thromboembolism based upon her CHA2DS2-VASc Score of 5.  Continue Warfarin (Coumadin).   CAD S/p CABG 2020 No anginal symptoms  HTN BP low today but stable, patient asymptomatic. If she develops symptoms, would hold losartan to accommodate higher dosing of rate control.   Valvular heart disease Rheumatic S/p mitral and aortic valve replacements 2018 Continue warfarin  Obesity Body mass index is 34.71 kg/m.  Encouraged lifestyle modification  Chronic HFpEF GDMT per primary cardiology team Fluid status appears stable  OSA  Encouraged nightly CPAP    Follow up with Dr Elberta Fortis as scheduled and then in the AF clinic for dofetilide loading.     Jorja Loa PA-C Afib  Clinic [facility] [address] [place], [place] [address] [phone]

## 2023-01-24 NOTE — Addendum Note (Signed)
Encounter addended by: Shona Simpson, RN on: 01/24/2023 10:32 AM  Actions taken: Order Reconciliation Section accessed, Order list changed

## 2023-01-24 NOTE — Telephone Encounter (Signed)
Patient struggles with GAD, MDD, and psychological insomnia.  History of atrial fibrillation and needs to avoid QTc prolonging medications.  Due to struggles with sleep, must also avoid medications with possibility for agitation or insomnia.  Only other alternative is desvenlafaxine, but recommend Trintellix over desvenlafaxine due to possibility of agitation and insomnia with desvenlafaxine.

## 2023-01-24 NOTE — Patient Instructions (Addendum)
Start weekly INR checks week of January 27th  Hold farxiga as of February 15th until after admission  Trazodone will need to replaced by PCP to agent that is not QT prolonging -- let Kennyth Arnold know once drug replaced.

## 2023-01-24 NOTE — Addendum Note (Signed)
Addended by: Saralyn Pilar on: 01/24/2023 11:28 AM   Modules accepted: Orders

## 2023-01-24 NOTE — Telephone Encounter (Signed)
Tobb, Kardie, DO  You; [name], [name], PA4 minutes ago (1:38 PM)   Hello Kimberly Hobbs,  It will be best to follow the recommendations of the primary provider - I will be out of my scope of practise on recommending a dosing on sleep aids  Dr. Sheppard Coil

## 2023-01-25 LAB — URINE CULTURE

## 2023-01-26 ENCOUNTER — Telehealth: Payer: Self-pay | Admitting: Cardiology

## 2023-01-26 NOTE — Telephone Encounter (Signed)
Patient called the answering service this afternoon reporting that her heart "went out of rhythm again." She has a history of atrial fibrillation, had an afib ablation in 08/2022. She later had recurrence of atrial flutter. She contacted the office on 11/25, reporting that she her HR was elevated in the 90s-120s, and she thought she was back in atrial fibrillation. Her losartan was decreased to 25 mg daily, and her metoprolol was increased to 25 mg BID.   She was seen in clinic on 01/20/23, and her HR was 122 BPM. EKG concerning for atrial flutter. Her metoprolol succinate was increased to 50 mg every AM, 25 mg every PM. She remained on cardizem 180 mg daily and coumadin. She was seen by the Afib clinic on 01/24/23. At that time, she had converted to NSR. Discussed dofetilide admission which patient was agreeable to. Now tentatively scheduled for dofetilide admission in 03/2023.   Patient called the answering service this afternoon because she believes she has gone back into atrial fib/flutter. She reports that at times, her HR jumps to the 160s, but then comes back down. Her HR does not stay elevated for very long, and her HR is predominantly in the 90s-100s. She can feel some fluttering in her chest. Denies syncope, chest pain, shortness of breath. BP 106/69.   Instructed patient to increase her metoprolol succinate to 50 mg BID. With this increase, she should stop her losartan to prevent hypotension. Patient asked when she should go to the ED for evaluation. Discussed that if she develops chest pain, syncope/near syncope, shortness of breath, or if her HR remains >150s for 30 minutes, she should go to the ED for evaluation.   I will forward this message to Dr. Elberta Fortis as an Olegario Shearer, PA-C 01/26/2023 2:24 PM

## 2023-01-27 ENCOUNTER — Other Ambulatory Visit: Payer: Self-pay | Admitting: Cardiology

## 2023-01-27 ENCOUNTER — Encounter: Payer: Self-pay | Admitting: Family Medicine

## 2023-01-27 ENCOUNTER — Encounter: Payer: Self-pay | Admitting: Cardiology

## 2023-01-27 DIAGNOSIS — Z7901 Long term (current) use of anticoagulants: Secondary | ICD-10-CM | POA: Diagnosis not present

## 2023-01-27 DIAGNOSIS — I480 Paroxysmal atrial fibrillation: Secondary | ICD-10-CM | POA: Diagnosis not present

## 2023-01-27 DIAGNOSIS — I2510 Atherosclerotic heart disease of native coronary artery without angina pectoris: Secondary | ICD-10-CM

## 2023-01-27 DIAGNOSIS — Z952 Presence of prosthetic heart valve: Secondary | ICD-10-CM | POA: Diagnosis not present

## 2023-01-27 MED ORDER — FUROSEMIDE 20 MG PO TABS: 20.0000 mg | ORAL_TABLET | Freq: Two times a day (BID) | ORAL | 3 refills | Status: DC

## 2023-01-27 MED ORDER — ONDANSETRON HCL 8 MG PO TABS: 8.0000 mg | ORAL_TABLET | Freq: Three times a day (TID) | ORAL | 0 refills | Status: DC | PRN

## 2023-01-27 NOTE — Telephone Encounter (Signed)
Dr. Elberta Fortis made aware Tikosyn adx canNOT be sooner d/t pt needs to be off Amiodarone for 3 month (stopped 11/18). Aware I will forward to AFib clinic, as they just saw pt Friday, to advise further....Marland KitchenMarland Kitchen

## 2023-01-28 ENCOUNTER — Ambulatory Visit: Payer: 59 | Admitting: Physical Medicine & Rehabilitation

## 2023-01-28 ENCOUNTER — Telehealth: Payer: Self-pay

## 2023-01-28 ENCOUNTER — Ambulatory Visit: Payer: 59 | Attending: Cardiology

## 2023-01-28 DIAGNOSIS — I480 Paroxysmal atrial fibrillation: Secondary | ICD-10-CM | POA: Diagnosis not present

## 2023-01-28 DIAGNOSIS — Z5181 Encounter for therapeutic drug level monitoring: Secondary | ICD-10-CM | POA: Diagnosis not present

## 2023-01-28 LAB — POCT INR: INR: 3.2 — AB (ref 2.0–3.0)

## 2023-01-28 NOTE — Telephone Encounter (Signed)
-----   Message ----- From: Shona Simpson, RN Sent: 01/24/2023   9:19 AM EST To: [facility] Subject: Kimberly Hobbs admission weekly INRs                  Pt is pending tikosyn hospital admission 04/08/23 - she will need weekly INRs to start week of January 27th Thank you! Stacy RN Afib Clinic  Received message above. Placed appt on Coumadin Clinic appt to start weekly INRs the week on January 27th.

## 2023-01-28 NOTE — Telephone Encounter (Signed)
Amiodarone level to be drawn to check current amiodarone level per Jorja Loa PA. Pt states today she feels great. Had nausea yesterday but did not end up needing nausea medication after receiving from PCP office. Heart rates controlled today.

## 2023-01-28 NOTE — Patient Instructions (Signed)
Description   Continue taking warfarin 5mg  daily except 7.5mg  on Monday, Wednesday, and Friday.  Recheck in 4 weeks in [place].  Anticoagulation Clinic [phone]

## 2023-01-29 ENCOUNTER — Telehealth (HOSPITAL_COMMUNITY): Payer: Self-pay | Admitting: *Deleted

## 2023-01-29 ENCOUNTER — Other Ambulatory Visit (HOSPITAL_COMMUNITY): Payer: Self-pay | Admitting: *Deleted

## 2023-01-29 DIAGNOSIS — I4819 Other persistent atrial fibrillation: Secondary | ICD-10-CM

## 2023-01-29 DIAGNOSIS — I10 Essential (primary) hypertension: Secondary | ICD-10-CM

## 2023-01-29 MED ORDER — DILTIAZEM HCL 30 MG PO TABS: ORAL_TABLET | ORAL | 2 refills | Status: DC

## 2023-01-29 MED ORDER — METOPROLOL SUCCINATE ER 50 MG PO TB24: 50.0000 mg | ORAL_TABLET | Freq: Two times a day (BID) | ORAL | 3 refills | Status: DC

## 2023-01-29 MED ORDER — LOSARTAN POTASSIUM 50 MG PO TABS: ORAL_TABLET | ORAL | Status: DC

## 2023-01-29 NOTE — Telephone Encounter (Signed)
Patient continues to have breakthrough afib with elevated HRs. Pt becomes very anxious and upset when she goes into afib. HRs this afternoon are intermittently up in the 140-160s BP  130/94.  Discussed afib characteristics with pt/reassured pt. Discussed with Jorja Loa PA will try cardizem 30mg  every 4 hours PRN for HR >100. Pt in agreement with plan.

## 2023-01-30 ENCOUNTER — Ambulatory Visit: Payer: 59 | Admitting: Family Medicine

## 2023-01-30 ENCOUNTER — Ambulatory Visit (HOSPITAL_COMMUNITY)
Admission: RE | Admit: 2023-01-30 | Discharge: 2023-01-30 | Disposition: A | Discharge status: Home / Self Care | Payer: 59 | Source: Ambulatory Visit | Attending: Physician Assistant | Admitting: Physician Assistant

## 2023-01-30 DIAGNOSIS — Z79899 Other long term (current) drug therapy: Secondary | ICD-10-CM | POA: Insufficient documentation

## 2023-01-30 DIAGNOSIS — Z5181 Encounter for therapeutic drug level monitoring: Secondary | ICD-10-CM | POA: Insufficient documentation

## 2023-01-30 DIAGNOSIS — G4733 Obstructive sleep apnea (adult) (pediatric): Secondary | ICD-10-CM | POA: Diagnosis not present

## 2023-02-03 ENCOUNTER — Encounter (HOSPITAL_COMMUNITY): Payer: Self-pay

## 2023-02-03 ENCOUNTER — Telehealth (HOSPITAL_COMMUNITY): Payer: Self-pay

## 2023-02-03 ENCOUNTER — Inpatient Hospital Stay (HOSPITAL_COMMUNITY): Payer: 59

## 2023-02-03 ENCOUNTER — Emergency Department (HOSPITAL_COMMUNITY): Payer: 59

## 2023-02-03 ENCOUNTER — Other Ambulatory Visit: Payer: Self-pay

## 2023-02-03 ENCOUNTER — Inpatient Hospital Stay (HOSPITAL_COMMUNITY)
Admission: EM | Admit: 2023-02-03 | Discharge: 2023-02-11 | DRG: 276 | Disposition: A | Discharge status: Home / Self Care | Payer: 59 | Attending: Cardiovascular Disease | Admitting: Cardiovascular Disease

## 2023-02-03 DIAGNOSIS — I4819 Other persistent atrial fibrillation: Secondary | ICD-10-CM | POA: Diagnosis present

## 2023-02-03 DIAGNOSIS — I130 Hypertensive heart and chronic kidney disease with heart failure and stage 1 through stage 4 chronic kidney disease, or unspecified chronic kidney disease: Secondary | ICD-10-CM | POA: Diagnosis present

## 2023-02-03 DIAGNOSIS — E785 Hyperlipidemia, unspecified: Secondary | ICD-10-CM | POA: Diagnosis present

## 2023-02-03 DIAGNOSIS — R61 Generalized hyperhidrosis: Secondary | ICD-10-CM | POA: Diagnosis present

## 2023-02-03 DIAGNOSIS — I472 Ventricular tachycardia, unspecified: Secondary | ICD-10-CM | POA: Diagnosis present

## 2023-02-03 DIAGNOSIS — Z87891 Personal history of nicotine dependence: Secondary | ICD-10-CM

## 2023-02-03 DIAGNOSIS — Z833 Family history of diabetes mellitus: Secondary | ICD-10-CM

## 2023-02-03 DIAGNOSIS — R0989 Other specified symptoms and signs involving the circulatory and respiratory systems: Secondary | ICD-10-CM | POA: Diagnosis not present

## 2023-02-03 DIAGNOSIS — N183 Chronic kidney disease, stage 3 unspecified: Secondary | ICD-10-CM | POA: Diagnosis present

## 2023-02-03 DIAGNOSIS — I129 Hypertensive chronic kidney disease with stage 1 through stage 4 chronic kidney disease, or unspecified chronic kidney disease: Secondary | ICD-10-CM | POA: Diagnosis not present

## 2023-02-03 DIAGNOSIS — I5021 Acute systolic (congestive) heart failure: Secondary | ICD-10-CM | POA: Diagnosis present

## 2023-02-03 DIAGNOSIS — Z951 Presence of aortocoronary bypass graft: Secondary | ICD-10-CM

## 2023-02-03 DIAGNOSIS — R002 Palpitations: Secondary | ICD-10-CM | POA: Diagnosis present

## 2023-02-03 DIAGNOSIS — J449 Chronic obstructive pulmonary disease, unspecified: Secondary | ICD-10-CM | POA: Diagnosis present

## 2023-02-03 DIAGNOSIS — Z952 Presence of prosthetic heart valve: Secondary | ICD-10-CM | POA: Diagnosis not present

## 2023-02-03 DIAGNOSIS — I2510 Atherosclerotic heart disease of native coronary artery without angina pectoris: Secondary | ICD-10-CM | POA: Diagnosis present

## 2023-02-03 DIAGNOSIS — I480 Paroxysmal atrial fibrillation: Secondary | ICD-10-CM | POA: Diagnosis not present

## 2023-02-03 DIAGNOSIS — Z888 Allergy status to other drugs, medicaments and biological substances status: Secondary | ICD-10-CM

## 2023-02-03 DIAGNOSIS — M1990 Unspecified osteoarthritis, unspecified site: Secondary | ICD-10-CM | POA: Diagnosis present

## 2023-02-03 DIAGNOSIS — Z8249 Family history of ischemic heart disease and other diseases of the circulatory system: Secondary | ICD-10-CM

## 2023-02-03 DIAGNOSIS — I080 Rheumatic disorders of both mitral and aortic valves: Secondary | ICD-10-CM | POA: Diagnosis not present

## 2023-02-03 DIAGNOSIS — Z8616 Personal history of COVID-19: Secondary | ICD-10-CM

## 2023-02-03 DIAGNOSIS — F419 Anxiety disorder, unspecified: Secondary | ICD-10-CM | POA: Diagnosis present

## 2023-02-03 DIAGNOSIS — E1122 Type 2 diabetes mellitus with diabetic chronic kidney disease: Secondary | ICD-10-CM | POA: Diagnosis present

## 2023-02-03 DIAGNOSIS — Z950 Presence of cardiac pacemaker: Secondary | ICD-10-CM | POA: Diagnosis not present

## 2023-02-03 DIAGNOSIS — Z91041 Radiographic dye allergy status: Secondary | ICD-10-CM | POA: Diagnosis not present

## 2023-02-03 DIAGNOSIS — I517 Cardiomegaly: Secondary | ICD-10-CM | POA: Diagnosis not present

## 2023-02-03 DIAGNOSIS — G2581 Restless legs syndrome: Secondary | ICD-10-CM | POA: Diagnosis present

## 2023-02-03 DIAGNOSIS — I25118 Atherosclerotic heart disease of native coronary artery with other forms of angina pectoris: Secondary | ICD-10-CM | POA: Diagnosis not present

## 2023-02-03 DIAGNOSIS — Z7984 Long term (current) use of oral hypoglycemic drugs: Secondary | ICD-10-CM

## 2023-02-03 DIAGNOSIS — I255 Ischemic cardiomyopathy: Secondary | ICD-10-CM | POA: Diagnosis present

## 2023-02-03 DIAGNOSIS — R079 Chest pain, unspecified: Secondary | ICD-10-CM | POA: Diagnosis not present

## 2023-02-03 DIAGNOSIS — D6869 Other thrombophilia: Secondary | ICD-10-CM | POA: Diagnosis present

## 2023-02-03 DIAGNOSIS — Z79899 Other long term (current) drug therapy: Secondary | ICD-10-CM

## 2023-02-03 DIAGNOSIS — H903 Sensorineural hearing loss, bilateral: Secondary | ICD-10-CM | POA: Diagnosis present

## 2023-02-03 DIAGNOSIS — Z880 Allergy status to penicillin: Secondary | ICD-10-CM

## 2023-02-03 DIAGNOSIS — I4891 Unspecified atrial fibrillation: Secondary | ICD-10-CM | POA: Diagnosis not present

## 2023-02-03 DIAGNOSIS — Z825 Family history of asthma and other chronic lower respiratory diseases: Secondary | ICD-10-CM

## 2023-02-03 DIAGNOSIS — Z7901 Long term (current) use of anticoagulants: Secondary | ICD-10-CM

## 2023-02-03 DIAGNOSIS — Z83438 Family history of other disorder of lipoprotein metabolism and other lipidemia: Secondary | ICD-10-CM

## 2023-02-03 DIAGNOSIS — G4733 Obstructive sleep apnea (adult) (pediatric): Secondary | ICD-10-CM | POA: Diagnosis present

## 2023-02-03 DIAGNOSIS — I099 Rheumatic heart disease, unspecified: Secondary | ICD-10-CM | POA: Diagnosis present

## 2023-02-03 DIAGNOSIS — Z7982 Long term (current) use of aspirin: Secondary | ICD-10-CM

## 2023-02-03 LAB — COMPREHENSIVE METABOLIC PANEL
ALT: 35 U/L (ref 0–44)
AST: 32 U/L (ref 15–41)
Albumin: 4.4 g/dL (ref 3.5–5.0)
Alkaline Phosphatase: 109 U/L (ref 38–126)
Anion gap: 16 — ABNORMAL HIGH (ref 5–15)
BUN: 22 mg/dL (ref 8–23)
CO2: 18 mmol/L — ABNORMAL LOW (ref 22–32)
Calcium: 10.3 mg/dL (ref 8.9–10.3)
Chloride: 104 mmol/L (ref 98–111)
Creatinine, Ser: 1.41 mg/dL — ABNORMAL HIGH (ref 0.44–1.00)
GFR, Estimated: 42 mL/min — ABNORMAL LOW (ref >60)
Glucose, Bld: 106 mg/dL — ABNORMAL HIGH (ref 70–99)
Potassium: 4.6 mmol/L (ref 3.5–5.1)
Sodium: 138 mmol/L (ref 135–145)
Total Bilirubin: 1.2 mg/dL — ABNORMAL HIGH (ref <1.2)
Total Protein: 8.4 g/dL — ABNORMAL HIGH (ref 6.5–8.1)

## 2023-02-03 LAB — PROTIME-INR
INR: 2.6 — ABNORMAL HIGH (ref 0.8–1.2)
Prothrombin Time: 28 s — ABNORMAL HIGH (ref 11.4–15.2)

## 2023-02-03 LAB — CBC WITH DIFFERENTIAL/PLATELET
Abs Immature Granulocytes: 0.06 10*3/uL (ref 0.00–0.07)
Basophils Absolute: 0.1 10*3/uL (ref 0.0–0.1)
Basophils Relative: 1 %
Eosinophils Absolute: 0.4 10*3/uL (ref 0.0–0.5)
Eosinophils Relative: 3 %
HCT: 38.2 % (ref 36.0–46.0)
Hemoglobin: 12.7 g/dL (ref 12.0–15.0)
Immature Granulocytes: 1 %
Lymphocytes Relative: 21 %
Lymphs Abs: 2.4 10*3/uL (ref 0.7–4.0)
MCH: 29.9 pg (ref 26.0–34.0)
MCHC: 33.2 g/dL (ref 30.0–36.0)
MCV: 89.9 fL (ref 80.0–100.0)
Monocytes Absolute: 0.9 10*3/uL (ref 0.1–1.0)
Monocytes Relative: 8 %
Neutro Abs: 7.5 10*3/uL (ref 1.7–7.7)
Neutrophils Relative %: 66 %
Platelets: 384 10*3/uL (ref 150–400)
RBC: 4.25 MIL/uL (ref 3.87–5.11)
RDW: 15.6 % — ABNORMAL HIGH (ref 11.5–15.5)
WBC: 11.3 10*3/uL — ABNORMAL HIGH (ref 4.0–10.5)
nRBC: 0 % (ref 0.0–0.2)

## 2023-02-03 LAB — TROPONIN I (HIGH SENSITIVITY)
Troponin I (High Sensitivity): 7 ng/L (ref <18)
Troponin I (High Sensitivity): 9 ng/L (ref <18)

## 2023-02-03 LAB — MAGNESIUM: Magnesium: 2.3 mg/dL (ref 1.7–2.4)

## 2023-02-03 LAB — MRSA NEXT GEN BY PCR, NASAL: MRSA by PCR Next Gen: NOT DETECTED

## 2023-02-03 LAB — GLUCOSE, CAPILLARY: Glucose-Capillary: 143 mg/dL — ABNORMAL HIGH (ref 70–99)

## 2023-02-03 LAB — TSH: TSH: 6.069 u[IU]/mL — ABNORMAL HIGH (ref 0.350–4.500)

## 2023-02-03 LAB — BRAIN NATRIURETIC PEPTIDE: B Natriuretic Peptide: 68.1 pg/mL (ref 0.0–100.0)

## 2023-02-03 MED ORDER — ISOSORBIDE MONONITRATE ER 30 MG PO TB24: 60.0000 mg | ORAL_TABLET | Freq: Every day | ORAL | Status: DC

## 2023-02-03 MED ORDER — ADENOSINE 6 MG/2ML IV SOLN
INTRAVENOUS | Status: AC
  Administered 2023-02-03: 12 mg
  Filled 2023-02-03: qty 4

## 2023-02-03 MED ORDER — AMIODARONE LOAD VIA INFUSION
150.0000 mg | Freq: Once | INTRAVENOUS | Status: DC
  Filled 2023-02-03: qty 83.34

## 2023-02-03 MED ORDER — ARFORMOTEROL TARTRATE 15 MCG/2ML IN NEBU: 15.0000 ug | INHALATION_SOLUTION | Freq: Two times a day (BID) | RESPIRATORY_TRACT | Status: DC

## 2023-02-03 MED ORDER — ETOMIDATE 2 MG/ML IV SOLN
10.0000 mg | Freq: Once | INTRAVENOUS | Status: DC
  Filled 2023-02-03: qty 10

## 2023-02-03 MED ORDER — INSULIN ASPART 100 UNIT/ML IJ SOLN
0.0000 [IU] | Freq: Three times a day (TID) | INTRAMUSCULAR | Status: DC
  Administered 2023-02-05 (×2): 1 [IU] via SUBCUTANEOUS
  Administered 2023-02-08: 5 [IU] via SUBCUTANEOUS
  Administered 2023-02-11: 2 [IU] via SUBCUTANEOUS

## 2023-02-03 MED ORDER — ADENOSINE 6 MG/2ML IV SOLN
6.0000 mg | Freq: Once | INTRAVENOUS | Status: AC
Start: 2023-02-03 — End: 2023-02-03
  Administered 2023-02-03: 6 mg via INTRAVENOUS
  Filled 2023-02-03: qty 2

## 2023-02-03 MED ORDER — ETOMIDATE 2 MG/ML IV SOLN
INTRAVENOUS | Status: AC | PRN
  Administered 2023-02-03: 10 mg via INTRAVENOUS

## 2023-02-03 MED ORDER — METOPROLOL SUCCINATE ER 50 MG PO TB24
50.0000 mg | ORAL_TABLET | Freq: Two times a day (BID) | ORAL | Status: DC
  Administered 2023-02-03 – 2023-02-04 (×2): 50 mg via ORAL
  Filled 2023-02-03 (×2): qty 1

## 2023-02-03 MED ORDER — HYDROCODONE-ACETAMINOPHEN 7.5-325 MG PO TABS
1.0000 | ORAL_TABLET | Freq: Two times a day (BID) | ORAL | Status: DC | PRN
  Administered 2023-02-05 – 2023-02-11 (×5): 1 via ORAL
  Filled 2023-02-03 (×5): qty 1

## 2023-02-03 MED ORDER — AMIODARONE HCL IN DEXTROSE 360-4.14 MG/200ML-% IV SOLN: 60.0000 mg/h | INTRAVENOUS | Status: DC

## 2023-02-03 MED ORDER — MONTELUKAST SODIUM 10 MG PO TABS
10.0000 mg | ORAL_TABLET | Freq: Every day | ORAL | Status: DC
  Administered 2023-02-04 – 2023-02-11 (×8): 10 mg via ORAL
  Filled 2023-02-03 (×8): qty 1

## 2023-02-03 MED ORDER — ARFORMOTEROL TARTRATE 15 MCG/2ML IN NEBU
15.0000 ug | INHALATION_SOLUTION | Freq: Two times a day (BID) | RESPIRATORY_TRACT | Status: DC
  Administered 2023-02-04 – 2023-02-09 (×11): 15 ug via RESPIRATORY_TRACT
  Filled 2023-02-03 (×16): qty 2

## 2023-02-03 MED ORDER — CLONAZEPAM 0.5 MG PO TABS
0.5000 mg | ORAL_TABLET | Freq: Every day | ORAL | Status: DC
  Administered 2023-02-03 – 2023-02-06 (×4): 0.5 mg via ORAL
  Filled 2023-02-03 (×4): qty 1

## 2023-02-03 MED ORDER — ONDANSETRON HCL 4 MG/2ML IJ SOLN: 4.0000 mg | Freq: Four times a day (QID) | INTRAMUSCULAR | Status: DC | PRN

## 2023-02-03 MED ORDER — ASPIRIN 81 MG PO TBEC
81.0000 mg | DELAYED_RELEASE_TABLET | Freq: Every day | ORAL | Status: DC
  Administered 2023-02-04 – 2023-02-11 (×8): 81 mg via ORAL
  Filled 2023-02-03 (×8): qty 1

## 2023-02-03 MED ORDER — HEPARIN (PORCINE) 25000 UT/250ML-% IV SOLN
1500.0000 [IU]/h | INTRAVENOUS | Status: DC
  Administered 2023-02-03: 1450 [IU]/h via INTRAVENOUS
  Administered 2023-02-04: 1500 [IU]/h via INTRAVENOUS
  Filled 2023-02-03 (×2): qty 250

## 2023-02-03 MED ORDER — ROSUVASTATIN CALCIUM 20 MG PO TABS
40.0000 mg | ORAL_TABLET | Freq: Every day | ORAL | Status: DC
  Administered 2023-02-04 – 2023-02-11 (×8): 40 mg via ORAL
  Filled 2023-02-03 (×8): qty 2

## 2023-02-03 MED ORDER — SODIUM CHLORIDE 0.9 % IV BOLUS
1000.0000 mL | Freq: Once | INTRAVENOUS | Status: AC
  Administered 2023-02-03: 1000 mL via INTRAVENOUS

## 2023-02-03 MED ORDER — MAGNESIUM OXIDE -MG SUPPLEMENT 400 (240 MG) MG PO TABS
400.0000 mg | ORAL_TABLET | Freq: Every day | ORAL | Status: DC
  Administered 2023-02-04 – 2023-02-11 (×8): 400 mg via ORAL
  Filled 2023-02-03 (×8): qty 1

## 2023-02-03 MED ORDER — UMECLIDINIUM BROMIDE 62.5 MCG/ACT IN AEPB
1.0000 | INHALATION_SPRAY | Freq: Every day | RESPIRATORY_TRACT | Status: DC
  Administered 2023-02-04: 1 via RESPIRATORY_TRACT
  Filled 2023-02-03: qty 7

## 2023-02-03 MED ORDER — UMECLIDINIUM BROMIDE 62.5 MCG/ACT IN AEPB: 1.0000 | INHALATION_SPRAY | Freq: Every day | RESPIRATORY_TRACT | Status: DC

## 2023-02-03 MED ORDER — SPIRONOLACTONE 25 MG PO TABS: 50.0000 mg | ORAL_TABLET | Freq: Every day | ORAL | Status: DC

## 2023-02-03 MED ORDER — PANTOPRAZOLE SODIUM 40 MG PO TBEC: 40.0000 mg | DELAYED_RELEASE_TABLET | Freq: Two times a day (BID) | ORAL | Status: DC

## 2023-02-03 MED ORDER — GABAPENTIN 100 MG PO CAPS
100.0000 mg | ORAL_CAPSULE | Freq: Every day | ORAL | Status: DC
  Administered 2023-02-03 – 2023-02-10 (×8): 100 mg via ORAL
  Filled 2023-02-03 (×8): qty 1

## 2023-02-03 MED ORDER — NITROGLYCERIN 0.4 MG SL SUBL: 0.4000 mg | SUBLINGUAL_TABLET | SUBLINGUAL | Status: DC | PRN

## 2023-02-03 MED ORDER — AMIODARONE HCL IN DEXTROSE 360-4.14 MG/200ML-% IV SOLN
30.0000 mg/h | INTRAVENOUS | Status: DC
  Filled 2023-02-03: qty 200

## 2023-02-03 MED ORDER — METOPROLOL TARTRATE 5 MG/5ML IV SOLN
5.0000 mg | Freq: Once | INTRAVENOUS | Status: AC
  Administered 2023-02-03: 5 mg via INTRAVENOUS
  Filled 2023-02-03: qty 5

## 2023-02-03 MED ORDER — ACETAMINOPHEN 325 MG PO TABS
650.0000 mg | ORAL_TABLET | ORAL | Status: DC | PRN
  Administered 2023-02-03 – 2023-02-11 (×8): 650 mg via ORAL
  Filled 2023-02-03 (×8): qty 2

## 2023-02-03 MED ORDER — ASPIRIN 81 MG PO TBEC: 81.0000 mg | DELAYED_RELEASE_TABLET | Freq: Every day | ORAL | Status: DC

## 2023-02-03 MED ORDER — MAGNESIUM SULFATE 2 GM/50ML IV SOLN
2.0000 g | Freq: Once | INTRAVENOUS | Status: DC
  Filled 2023-02-03: qty 50

## 2023-02-03 NOTE — Progress Notes (Signed)
Echocardiogram 2D Echocardiogram has been performed.  Lucendia Herrlich 02/03/2023, 6:41 PM

## 2023-02-03 NOTE — Progress Notes (Signed)
   Placed admit orders for Dr. Elease Hashimoto. Initially, continued home antihypertensive (Toprol-XL, Imdur, and Spironolactone). However, BP soft this afternoon/ evening. Will hold Imdur and Spironolactone. Continue Toprol-XL given she presented with wide-complex tachycardia. Will hold Metformin and order sliding scale insulin. Please see Dr. Harvie Bridge H&P today for additional information.  Corrin Parker, PA-C 02/03/2023 5:41 PM

## 2023-02-03 NOTE — ED Triage Notes (Signed)
Pt c.o nausea. Sent from a fib clinic for a fib RVR, hx of same. Denies chest pain or SOB

## 2023-02-03 NOTE — ED Provider Notes (Signed)
[facility] Provider Note  CSN: [id number] Arrival date & time: 02/03/23 1206  Chief Complaint(s) Tachycardia  HPI Kimberly Hobbs is a 62 y.o. female with past medical history as below, significant for CHF, CKD, HLD, HTN, mitral valve replacement, rheumatic heart disease who presents to the ED with complaint of palpitations, diaphoretic, elevated heart rate  Feeling well for the past 1 to 2 weeks, intermittently diaphoretic, lightheaded. She follows w/ afib clinic dr Elberta Fortis is her EP.  Failed amio previously, plan to start dofetilide w/ admission in upcoming months after amio washout. Here with palpitations, HR elev, light headed, diaphoretic at times. Rapid HR on apple watch   Past Medical History Past Medical History:  Diagnosis Date   Allergy ?   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 01/01/2017 01/01/2017   Patient Active Problem List   Diagnosis Date Noted   Ventricular tachycardia (HCC) 02/03/2023   VT (ventricular tachycardia) (HCC) 02/03/2023   Psychophysiological insomnia 01/23/2023   Hypercoagulable state due to persistent atrial fibrillation (HCC) 10/03/2022   Restless leg 10/01/2022   OSA (obstructive sleep apnea) 09/27/2022   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/2022   Iron deficiency anemia 07/31/2022   DDD (degenerative disc disease), lumbosacral 03/26/2022   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   Chronic  right-sided low back pain without sciatica 02/26/2022   Cervical strain 12/20/2021   Sensorineural hearing loss (SNHL) of both ears 10/24/2021   Hemorrhoids 05/18/2021   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   Paroxysmal atrial fibrillation (HCC) 03/28/2021   GAD (generalized anxiety disorder) 02/01/2021   DM (diabetes mellitus) (HCC) 02/01/2021   COPD mixed type (HCC) 07/07/2019   Pulmonary hypertension, unspecified (HCC) 07/21/2018   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/05/2018   Long term (current) use of anticoagulants 01/08/2017   H/O mitral valve replacement with mechanical #25 mechanical SJM 01/01/2017) 01/01/2017   PVCs (premature ventricular contractions) 11/17/2015   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    Essential hypertension 06/29/2015   Home Medication(s) Prior to Admission medications   Medication Sig Start Date End Date Taking? Authorizing Provider  albuterol (VENTOLIN HFA) 108 (90 Base) MCG/ACT inhaler Inhale 1-2 puffs into the lungs every 6 (six) hours as needed for wheezing or shortness of breath.   Yes [provider]  aspirin EC 81 MG tablet Take 81 mg by mouth daily.    Yes [provider]  clonazePAM (KLONOPIN) 0.5 MG tablet Take 1.5 tablets (0.75 mg total) by mouth at bedtime. 01/23/23  Yes Saralyn Pilar A, PA  cyclobenzaprine (FLEXERIL) 10 MG tablet Take 1 tablet (10 mg total) by mouth at bedtime as needed for muscle spasms. 12/25/22  Yes [name], MD  diclofenac Sodium (VOLTAREN ARTHRITIS PAIN) 1 % GEL Apply 4 g topically 4 (four) times daily.  Patient taking differently: Apply 4 g topically 4 (four) times daily as needed (pain). 08/23/22  Yes [name], MD  diltiazem (CARDIZEM) 30 MG tablet Take 1 tablet every 4 hours AS NEEDED for AFIB heart rate >100 as long as top BP >100. 01/29/23  Yes [name], [name], PA  empagliflozin (JARDIANCE) 10 MG  TABS tablet Take 10 mg by mouth daily.   Yes [provider]  furosemide (LASIX) 20 MG tablet Take 1 tablet (20 mg total) by mouth 2 (two) times daily. 01/27/23 04/27/23 Yes Tobb, Kardie, DO  gabapentin (NEURONTIN) 100 MG capsule Take 1 capsule (100 mg total) by mouth at bedtime. 12/23/22  Yes Saralyn Pilar A, PA  HYDROcodone-acetaminophen (NORCO) 7.5-325 MG tablet Take 1 tablet by mouth every 12 (twelve) hours as needed for moderate pain (pain score 4-6). 12/25/22  Yes [name], MD  isosorbide mononitrate (IMDUR) 60 MG 24 hr tablet Take 1 tablet (60 mg total) by mouth daily. 09/13/22  Yes [name], [name], MD  losartan (COZAAR) 50 MG tablet HOLD while on higher dose of metoprolol 12/11 01/29/23  Yes [name], [name], PA  metFORMIN (GLUCOPHAGE) 500 MG tablet Take 1 tablet (500 mg total) by mouth 2 (two) times daily with a meal. 12/23/22  Yes Saralyn Pilar A, PA  metoprolol succinate (TOPROL XL) 50 MG 24 hr tablet Take 1 tablet (50 mg total) by mouth 2 (two) times daily. 01/29/23  Yes [name], [name], PA  montelukast (SINGULAIR) 10 MG tablet Take 1 tablet (10 mg total) by mouth daily. 05/27/22 05/27/23 Yes Charlott Holler, MD  nitroGLYCERIN (NITROSTAT) 0.4 MG SL tablet DISSOLVE 1 TABLET UNDER THE TONGUE EVERY 5 MINUTES FOR 3 DOSES AS NEEDED FOR CHEST PAIN Patient taking differently: Place 0.4 mg under the tongue every 5 (five) minutes as needed for chest pain. 05/27/22  Yes [name], Manish J, MD  ondansetron (ZOFRAN) 8 MG tablet Take 1 tablet (8 mg total) by mouth every 8 (eight) hours as needed for nausea or vomiting. 01/27/23  Yes Saralyn Pilar A, PA  potassium chloride SA (KLOR-CON M) 20 MEQ tablet Take 1 tablet (20 mEq total) by mouth 2 (two) times daily. Patient taking differently: Take 40 mEq by mouth daily. 07/12/22  Yes [name], Manish J, MD  rosuvastatin (CRESTOR) 40 MG tablet Take 1 tablet (40 mg total) by mouth daily. 08/29/22  Yes [name], Manish J, MD  spironolactone  (ALDACTONE) 50 MG tablet Take 1 tablet (50 mg total) by mouth daily. 09/03/22  Yes [name], Manish J, MD  Tiotropium Bromide-Olodaterol (STIOLTO RESPIMAT) 2.5-2.5 MCG/ACT AERS Inhale 2 puffs into the lungs daily. 09/03/22  Yes Charlott Holler, MD  warfarin (COUMADIN) 5 MG tablet Take 7.5 mg on Monday and Wed and 5 mg all other days Patient taking differently: Take 5 mg by mouth See admin instructions. Take 5mg  (one tablet) once daily on Tuesdays, Thursdays, Saturdays, and Sundays. To be taken in addition to warfarin 7.5mg  once daily on Mondays, Wednesdays, and Fridays. 08/20/22  Yes Yates Decamp, MD  warfarin (COUMADIN) 7.5 MG tablet Take 7.5 mg on Monday and Wednesday and 5 mg all other days Patient taking differently: Take 7.5 mg by mouth See admin instructions. Take 7.5mg  (one tablet) once daily on Mondays, Wednesdays, and Fridays. To be taken in addition to warfarin 5mg  once daily on Tuesdays, Thursdays, Saturdays, and Sundays. 08/20/22  Yes Yates Decamp, MD  Accu-Chek Softclix Lancets lancets Use as instructed Patient taking differently: 1 each by Other route See  admin instructions. Use as instructed 05/23/22   Nche, [name], NP  Blood Glucose Monitoring Suppl (ACCU-CHEK GUIDE) w/Device KIT Check glucose every morning Patient taking differently: 1 each by Other route See admin instructions. Check glucose every morning 05/21/22   Nche, [name], NP  dapagliflozin propanediol (FARXIGA) 5 MG TABS tablet Take 5 mg by mouth daily.    [provider]  glucose blood (ACCU-CHEK GUIDE) test strip Use as instructed Patient taking differently: 1 each by Other route as needed for other (Glucose chesk). Use as instructed 05/21/22   Nche, [name], NP  vortioxetine HBr (TRINTELLIX) 5 MG TABS tablet Take 1 tablet (5 mg total) by mouth at bedtime. Patient not taking: Reported on 02/03/2023 01/24/23   [name], PA                                                                                                                                     Past Surgical History Past Surgical History:  Procedure Laterality Date   ATRIAL FIBRILLATION ABLATION N/A 09/05/2022   Procedure: ATRIAL FIBRILLATION ABLATION;  Surgeon: Regan Lemming, MD;  Location: [facility];  Service: Cardiovascular;  Laterality: N/A;   CARDIAC CATHETERIZATION     CARDIAC VALVE REPLACEMENT     CARDIOVERSION N/A 04/02/2022   Procedure: CARDIOVERSION;  Surgeon: Elder Negus, MD;  Location: [facility];  Service: Cardiovascular;  Laterality: N/A;   CARDIOVERSION N/A 04/27/2022   Procedure: CARDIOVERSION;  Surgeon: Elder Negus, MD;  Location: [facility];  Service: Cardiovascular;  Laterality: N/A;   CHOLECYSTECTOMY     CORONARY ARTERY BYPASS GRAFT     CORONARY/GRAFT ANGIOGRAPHY N/A 08/18/2018   Procedure: CORONARY/GRAFT ANGIOGRAPHY;  Surgeon: Elder Negus, MD;  Location: [facility];  Service: Cardiovascular;  Laterality: N/A;   LEG SURGERY     "metal plate in leg"   RIGHT HEART CATH AND CORONARY/GRAFT ANGIOGRAPHY N/A 07/21/2018   Procedure: RIGHT HEART CATH AND CORONARY/GRAFT ANGIOGRAPHY;  Surgeon: Elder Negus, MD;  Location: [facility];  Service: Cardiovascular;  Laterality: N/A;   TEE WITHOUT CARDIOVERSION N/A 07/27/2015   Procedure: TRANSESOPHAGEAL ECHOCARDIOGRAM (TEE);  Surgeon: Thurmon Fair, MD;  Location: [facility];  Service: Cardiovascular;  Laterality: N/A;   TEE WITHOUT CARDIOVERSION N/A 07/21/2018   Procedure: TRANSESOPHAGEAL ECHOCARDIOGRAM (TEE);  Surgeon: Elder Negus, MD;  Location: [facility] ENDOSCOPY;  Service: Cardiovascular;  Laterality: N/A;   TONSILLECTOMY AND ADENOIDECTOMY     Family History Family History  Problem Relation Age of Onset   Cancer Mother    Hypertension Mother    Diabetes Mother    Hyperlipidemia Mother    Heart disease Mother    Cancer Father    Hypertension Father    Diabetes Father    Heart disease  Father    Hypertension Sister    Schizophrenia Maternal Grandmother    Hypertension Brother  Hypertension Brother    Cancer Daughter    COPD Daughter    Breast cancer Neg Hx     Social History Social History   Tobacco Use   Smoking status: Former    Current packs/day: 0.00    Average packs/day: 1.6 packs/day for 50.0 years (80.0 ttl pk-yrs)    Types: Cigarettes    Start date: 12/23/1976    Quit date: 12/23/2016    Years since quitting: 6.1    Passive exposure: Past   Smokeless tobacco: Never   Tobacco comments:    Former smoker 01/24/23  Vaping Use   Vaping status: Never Used  Substance Use Topics   Alcohol use: Never   Drug use: Yes    Types: Hydrocodone   Allergies Duloxetine hcl, Iodinated contrast media, and Penicillins  Review of Systems Review of Systems  Constitutional:  Positive for diaphoresis. Negative for fever.  Respiratory:  Positive for chest tightness. Negative for shortness of breath.   Cardiovascular:  Positive for palpitations. Negative for chest pain.  Genitourinary:  Negative for difficulty urinating.  Skin:  Negative for wound.  Neurological:  Positive for light-headedness. Negative for syncope.  All other systems reviewed and are negative.   Physical Exam Vital Signs  I have reviewed the triage vital signs BP (!) 108/45   Pulse (!) 57   Temp 97.9 F (36.6 C) (Oral)   Resp 18   Ht 5\' 5"  (1.651 m)   Wt 93.2 kg   LMP 06/20/2015   SpO2 92%   BMI 34.19 kg/m  Physical Exam Vitals and nursing note reviewed.  Constitutional:      General: She is not in acute distress.    Appearance: Normal appearance.  HENT:     Head: Normocephalic and atraumatic.     Right Ear: External ear normal.     Left Ear: External ear normal.     Nose: Nose normal.     Mouth/Throat:     Mouth: Mucous membranes are moist.  Eyes:     General: No scleral icterus.       Right eye: No discharge.        Left eye: No discharge.  Cardiovascular:     Rate and  Rhythm: Regular rhythm. Tachycardia present.     Pulses: Normal pulses.     Heart sounds: Murmur heard.     Comments: Wide complex tachy on tele Pulmonary:     Effort: Pulmonary effort is normal. No respiratory distress.     Breath sounds: Normal breath sounds. No stridor.  Abdominal:     General: Abdomen is flat. There is no distension.     Palpations: Abdomen is soft.     Tenderness: There is no abdominal tenderness.  Musculoskeletal:     Cervical back: No rigidity.     Right lower leg: No edema.     Left lower leg: No edema.  Skin:    General: Skin is warm and dry.     Capillary Refill: Capillary refill takes less than 2 seconds.  Neurological:     Mental Status: She is alert.  Psychiatric:        Mood and Affect: Mood normal.        Behavior: Behavior normal. Behavior is cooperative.     ED Results and Treatments Labs (all labs ordered are listed, but only abnormal results are displayed) Labs Reviewed  COMPREHENSIVE METABOLIC PANEL - Abnormal; Notable for the following components:      Result Value  CO2 18 (*)    Glucose, Bld 106 (*)    Creatinine, Ser 1.41 (*)    Total Protein 8.4 (*)    Total Bilirubin 1.2 (*)    GFR, Estimated 42 (*)    Anion gap 16 (*)    All other components within normal limits  CBC WITH DIFFERENTIAL/PLATELET - Abnormal; Notable for the following components:   WBC 11.3 (*)    RDW 15.6 (*)    All other components within normal limits  TSH - Abnormal; Notable for the following components:   TSH 6.069 (*)    All other components within normal limits  PROTIME-INR - Abnormal; Notable for the following components:   Prothrombin Time 28.0 (*)    INR 2.6 (*)    All other components within normal limits  BASIC METABOLIC PANEL - Abnormal; Notable for the following components:   Glucose, Bld 111 (*)    Creatinine, Ser 1.44 (*)    GFR, Estimated 41 (*)    All other components within normal limits  CBC - Abnormal; Notable for the following  components:   RBC 3.65 (*)    Hemoglobin 10.8 (*)    HCT 33.1 (*)    All other components within normal limits  HEPARIN LEVEL (UNFRACTIONATED) - Abnormal; Notable for the following components:   Heparin Unfractionated 0.29 (*)    All other components within normal limits  GLUCOSE, CAPILLARY - Abnormal; Notable for the following components:   Glucose-Capillary 143 (*)    All other components within normal limits  GLUCOSE, CAPILLARY - Abnormal; Notable for the following components:   Glucose-Capillary 108 (*)    All other components within normal limits  MRSA NEXT GEN BY PCR, NASAL  MAGNESIUM  BRAIN NATRIURETIC PEPTIDE  HEPARIN LEVEL (UNFRACTIONATED)  PROTIME-INR  TROPONIN I (HIGH SENSITIVITY)  TROPONIN I (HIGH SENSITIVITY)                                                                                                                          Radiology DG Chest 1 View Result Date: 02/03/2023 CLINICAL DATA:  Chest pain. EXAM: CHEST  1 VIEW COMPARISON:  April 25, 2022 FINDINGS: Multiple sternal wires and vascular clips are seen. The cardiac silhouette is mildly enlarged and unchanged in size. An artificial cardiac valve is noted. Mild prominence of the pulmonary vasculature is seen. There is marked severity calcification of the aortic arch. Low lung volumes are noted. There is no evidence of an acute infiltrate, pleural effusion or pneumothorax. The visualized skeletal structures are unremarkable. IMPRESSION: 1. Evidence of prior median sternotomy/CABG. 2. Low lung volumes and mild pulmonary vascular congestion without frank pulmonary edema. Electronically Signed   By: Aram Candela M.D.   On: 02/03/2023 14:40    Pertinent labs & imaging results that were available during my care of the patient were reviewed by me and considered in my medical decision making (see MDM for details).  Medications Ordered in ED Medications  HYDROcodone-acetaminophen (  NORCO) 7.5-325 MG per tablet 1 tablet  (has no administration in time range)  metoprolol succinate (TOPROL-XL) 24 hr tablet 50 mg (0 mg Oral Hold 02/04/23 1000)  rosuvastatin (CRESTOR) tablet 40 mg (has no administration in time range)  magnesium oxide (MAG-OX) tablet 400 mg (has no administration in time range)  clonazePAM (KLONOPIN) tablet 0.5 mg (0.5 mg Oral Given 02/03/23 2022)  gabapentin (NEURONTIN) capsule 100 mg (100 mg Oral Given 02/03/23 2022)  montelukast (SINGULAIR) tablet 10 mg (has no administration in time range)  nitroGLYCERIN (NITROSTAT) SL tablet 0.4 mg (has no administration in time range)  acetaminophen (TYLENOL) tablet 650 mg (650 mg Oral Given 02/03/23 1948)  ondansetron (ZOFRAN) injection 4 mg (has no administration in time range)  insulin aspart (novoLOG) injection 0-9 Units (0 Units Subcutaneous Hold 02/04/23 0742)  heparin ADULT infusion 100 units/mL (25000 units/250mL) (1,500 Units/hr Intravenous Infusion Verify 02/04/23 0600)  aspirin EC tablet 81 mg (has no administration in time range)  arformoterol (BROVANA) nebulizer solution 15 mcg (has no administration in time range)    And  umeclidinium bromide (INCRUSE ELLIPTA) 62.5 MCG/ACT 1 puff (has no administration in time range)  Oral care mouth rinse (has no administration in time range)  Chlorhexidine Gluconate Cloth 2 % PADS 6 each (has no administration in time range)  sodium chloride 0.9 % bolus 1,000 mL (1,000 mLs Intravenous New Bag/Given 02/03/23 1409)  metoprolol tartrate (LOPRESSOR) injection 5 mg (5 mg Intravenous Given 02/03/23 1402)  adenosine (ADENOCARD) 6 MG/2ML injection 6 mg (6 mg Intravenous Given 02/03/23 1426)  adenosine (ADENOCARD) 6 MG/2ML injection (12 mg  Given 02/03/23 1445)  etomidate (AMIDATE) injection (10 mg Intravenous Given 02/03/23 1457)                                                                                                                                     Procedures .Critical Care  Performed by: Sloan Leiter, DO Authorized by: Sloan Leiter, DO   Critical care provider statement:    Critical care time (minutes):  45   Critical care time was exclusive of:  Separately billable procedures and treating other patients   Critical care was necessary to treat or prevent imminent or life-threatening deterioration of the following conditions:  Cardiac failure   Critical care was time spent personally by me on the following activities:  Development of treatment plan with patient or surrogate, discussions with consultants, evaluation of patient's response to treatment, examination of patient, ordering and review of laboratory studies, ordering and review of radiographic studies, ordering and performing treatments and interventions, pulse oximetry, re-evaluation of patient's condition, review of old charts and obtaining history from patient or surrogate   Care discussed with: admitting provider   .Cardioversion  Date/Time: 02/04/2023 7:44 AM  Performed by: Sloan Leiter, DO Authorized by: Sloan Leiter, DO   Consent:    Consent obtained:  Verbal and written  Consent given by:  Patient   Risks discussed:  Cutaneous burn, death and induced arrhythmia   Alternatives discussed:  No treatment and rate-control medication Pre-procedure details:    Cardioversion basis:  Emergent   Rhythm:  Ventricular tachycardia   Electrode placement:  Anterior-posterior Patient sedated: Yes. Refer to sedation procedure documentation for details of sedation.  Attempt one:    Cardioversion mode:  Synchronous   Waveform:  Biphasic   Shock (Joules):  200   Shock outcome:  Conversion to normal sinus rhythm Post-procedure details:    Patient status:  Awake   Patient tolerance of procedure:  Tolerated well, no immediate complications .Sedation  Date/Time: 02/04/2023 7:45 AM  Performed by: Sloan Leiter, DO Authorized by: Sloan Leiter, DO   Consent:    Consent obtained:  Verbal and written   Consent given by:   Patient   Risks discussed:  Allergic reaction, dysrhythmia, inadequate sedation and nausea   Alternatives discussed:  Anxiolysis Universal protocol:    Procedure explained and questions answered to patient or proxy's satisfaction: yes     Immediately prior to procedure, a time out was called: yes     Patient identity confirmed:  Arm band and verbally with patient Indications:    Procedure performed:  Cardioversion   Procedure necessitating sedation performed by:  Physician performing sedation Pre-sedation assessment:    Time since last food or drink:  4   ASA classification: class 3 - patient with severe systemic disease     Mallampati score:  I - soft palate, uvula, fauces, pillars visible   Pre-sedation assessments completed and reviewed: airway patency, cardiovascular function, hydration status, mental status, nausea/vomiting, pain level, respiratory function and temperature   A pre-sedation assessment was completed prior to the start of the procedure Immediate pre-procedure details:    Reassessment: Patient reassessed immediately prior to procedure     Reviewed: vital signs, relevant labs/tests and NPO status     Verified: bag valve mask available, emergency equipment available, intubation equipment available, IV patency confirmed, oxygen available, reversal medications available and suction available   Procedure details (see MAR for exact dosages):    Preoxygenation:  Nasal cannula   Sedation:  Etomidate   Intended level of sedation: deep   Intra-procedure monitoring:  Blood pressure monitoring, cardiac monitor, frequent vital sign checks, frequent LOC assessments, continuous pulse oximetry and continuous capnometry   Intra-procedure events: airway compromise     Intra-procedure management:  Airway repositioning   Total Provider sedation time (minutes):  10 Post-procedure details:   A post-sedation assessment was completed following the completion of the procedure.   Attendance:  Constant attendance by certified staff until patient recovered     Recovery: Patient returned to pre-procedure baseline     Post-sedation assessments completed and reviewed: airway patency, cardiovascular function, hydration status, nausea/vomiting, pain level, respiratory function and temperature     Post-sedation assessments completed and reviewed: post-procedure mental status not reviewed     Patient is stable for discharge or admission: yes     Procedure completion:  Tolerated well, no immediate complications   (including critical care time)  Medical Decision Making / ED Course    Medical Decision Making:    Kimberly Hobbs is a 62 y.o. female with past medical history as below, significant for CHF, CKD, HLD, HTN, mitral valve replacement, rheumatic heart disease who presents to the ED with complaint of palpitations, diaphoretic, elevated heart rate. The complaint involves an extensive differential diagnosis and also  carries with it a high risk of complications and morbidity.  Serious etiology was considered. Ddx includes but is not limited to: SVT, VT, RVR, dysrhythmia, electrolyte derangement, endocrine disturbance, etc.  Complete initial physical exam performed, notably the patient was in rapid heart rate, blood pressure stable.    Reviewed and confirmed nursing documentation for past medical history, family history, social history.  Vital signs reviewed.     Clinical Course as of 02/04/23 0749  Mon Feb 03, 2023  1353 will give lopressor/magnesium sulfate and d/w cards [SG]  1408 Spoke with Dr. Elease Hashimoto, will come evaluate [SG]  1414 Spoke again with cards, recommend adenosine to see if underlying flutter etc, pt agreeable, cards will come to assist  [SG]    Clinical Course User Index [SG] Sloan Leiter, DO    Brief summary: 62 year old female history as above including A-fib on Dilt and beta-blocker, cabg, copd, CKD stage 3, mitral/aortic valve replacement on Coumadin with  elevated heart rate, weakness, lightheaded, diaphoretic.  Wide-complex tachycardia noted on telemetry and EKG concerning for vtach.  Possible underlying flutter but unclear due to rapid rate.  Delay in labs and IV access secondary to difficult access.  Given Lopressor without change.  Discussed with cardiology, adenosine trial with no change.  Will plan DCCV with procedural sedation.  Dr Elease Hashimoto to assist. Tolerated well, converted to sinus rhythm.  Plan admission to cardiology service                 Additional history obtained: -Additional history obtained from spouse -External records from outside source obtained and reviewed including: Chart review including previous notes, labs, imaging, consultation notes including  Office notes from cardiology, home meds, prior echo   Lab Tests: -I ordered, reviewed, and interpreted labs.   The pertinent results include:   Labs Reviewed  COMPREHENSIVE METABOLIC PANEL - Abnormal; Notable for the following components:      Result Value   CO2 18 (*)    Glucose, Bld 106 (*)    Creatinine, Ser 1.41 (*)    Total Protein 8.4 (*)    Total Bilirubin 1.2 (*)    GFR, Estimated 42 (*)    Anion gap 16 (*)    All other components within normal limits  CBC WITH DIFFERENTIAL/PLATELET - Abnormal; Notable for the following components:   WBC 11.3 (*)    RDW 15.6 (*)    All other components within normal limits  TSH - Abnormal; Notable for the following components:   TSH 6.069 (*)    All other components within normal limits  PROTIME-INR - Abnormal; Notable for the following components:   Prothrombin Time 28.0 (*)    INR 2.6 (*)    All other components within normal limits  BASIC METABOLIC PANEL - Abnormal; Notable for the following components:   Glucose, Bld 111 (*)    Creatinine, Ser 1.44 (*)    GFR, Estimated 41 (*)    All other components within normal limits  CBC - Abnormal; Notable for the following components:   RBC 3.65 (*)     Hemoglobin 10.8 (*)    HCT 33.1 (*)    All other components within normal limits  HEPARIN LEVEL (UNFRACTIONATED) - Abnormal; Notable for the following components:   Heparin Unfractionated 0.29 (*)    All other components within normal limits  GLUCOSE, CAPILLARY - Abnormal; Notable for the following components:   Glucose-Capillary 143 (*)    All other components within normal limits  GLUCOSE,  CAPILLARY - Abnormal; Notable for the following components:   Glucose-Capillary 108 (*)    All other components within normal limits  MRSA NEXT GEN BY PCR, NASAL  MAGNESIUM  BRAIN NATRIURETIC PEPTIDE  HEPARIN LEVEL (UNFRACTIONATED)  PROTIME-INR  TROPONIN I (HIGH SENSITIVITY)  TROPONIN I (HIGH SENSITIVITY)    Notable for as above stable  EKG   EKG Interpretation Date/Time:  Monday February 03 2023 12:50:41 EST Ventricular Rate:  161 PR Interval:  121 QRS Duration:  153 QT Interval:  328 QTC Calculation: 537 R Axis:   178  Text Interpretation: Extreme tachycardia with wide complex, no further rhythm analysis attempted Consider dextrocardia Confirmed by Tanda Rockers ([id number]) on 02/03/2023 1:07:41 PM         Imaging Studies ordered: I ordered imaging studies including CXR I independently visualized the following imaging with scope of interpretation limited to determining acute life threatening conditions related to emergency care; findings noted above I independently visualized and interpreted imaging. I agree with the radiologist interpretation   Medicines ordered and prescription drug management: Meds ordered this encounter  Medications   sodium chloride 0.9 % bolus 1,000 mL   DISCONTD: amiodarone (NEXTERONE) 1.8 mg/mL load via infusion 150 mg   DISCONTD: amiodarone (NEXTERONE PREMIX) 360-4.14 MG/200ML-% (1.8 mg/mL) IV infusion   DISCONTD: amiodarone (NEXTERONE PREMIX) 360-4.14 MG/200ML-% (1.8 mg/mL) IV infusion   metoprolol tartrate (LOPRESSOR) injection 5 mg   DISCONTD:  magnesium sulfate IVPB 2 g 50 mL   adenosine (ADENOCARD) 6 MG/2ML injection 6 mg   adenosine (ADENOCARD) 6 MG/2ML injection    Carico, Chasity M: cabinet override   DISCONTD: etomidate (AMIDATE) injection 10 mg   etomidate (AMIDATE) injection   DISCONTD: aspirin EC tablet 81 mg   HYDROcodone-acetaminophen (NORCO) 7.5-325 MG per tablet 1 tablet   DISCONTD: isosorbide mononitrate (IMDUR) 24 hr tablet 60 mg   metoprolol succinate (TOPROL-XL) 24 hr tablet 50 mg   rosuvastatin (CRESTOR) tablet 40 mg   DISCONTD: spironolactone (ALDACTONE) tablet 50 mg   magnesium oxide (MAG-OX) tablet 400 mg   DISCONTD: pantoprazole (PROTONIX) EC tablet 40 mg   clonazePAM (KLONOPIN) tablet 0.5 mg   gabapentin (NEURONTIN) capsule 100 mg   montelukast (SINGULAIR) tablet 10 mg   DISCONTD: arformoterol (BROVANA) nebulizer solution 15 mcg   DISCONTD: umeclidinium bromide (INCRUSE ELLIPTA) 62.5 MCG/ACT 1 puff   nitroGLYCERIN (NITROSTAT) SL tablet 0.4 mg   acetaminophen (TYLENOL) tablet 650 mg   ondansetron (ZOFRAN) injection 4 mg   insulin aspart (novoLOG) injection 0-9 Units    Correction coverage::   Sensitive (thin, NPO, renal)    CBG < 70::   Implement Hypoglycemia Standing Orders and refer to Hypoglycemia Standing Orders sidebar report    CBG 70 - 120::   0 units    CBG 121 - 150::   1 unit    CBG 151 - 200::   2 units    CBG 201 - 250::   3 units    CBG 251 - 300::   5 units    CBG 301 - 350::   7 units    CBG 351 - 400:   9 units    CBG > 400:   call MD and obtain STAT lab verification   heparin ADULT infusion 100 units/mL (25000 units/271mL)   aspirin EC tablet 81 mg   AND Linked Order Group    arformoterol (BROVANA) nebulizer solution 15 mcg    umeclidinium bromide (INCRUSE ELLIPTA) 62.5 MCG/ACT 1 puff   Oral care  mouth rinse   Chlorhexidine Gluconate Cloth 2 % PADS 6 each    -I have reviewed the patients home medicines and have made adjustments as needed   Consultations Obtained: I  requested consultation with the cardiology,  and discussed lab and imaging findings as well as pertinent plan - they recommend: will come eval   Cardiac Monitoring: The patient was maintained on a cardiac monitor.  I personally viewed and interpreted the cardiac monitored which showed an underlying rhythm of: wide complex tachycardia Continuous pulse oximetry interpreted by myself, 97% on RA.    Social Determinants of Health:  Diagnosis or treatment significantly limited by social determinants of health: former smoker   Reevaluation: After the interventions noted above, I reevaluated the patient and found that they have improved  Co morbidities that complicate the patient evaluation  Past Medical History:  Diagnosis Date   Allergy ?   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    Final Clinical Impression(s) / ED Diagnoses Final diagnoses:  V-tach (HCC)        Sloan Leiter, DO 02/04/23 [phone]

## 2023-02-03 NOTE — Consult Note (Addendum)
ELECTROPHYSIOLOGY CONSULT NOTE    Patient ID: Kimberly Hobbs MRN: [id number], DOB/AGE: [birth year] 62 y.o.  Admit date: 02/03/2023 Date of Consult: 02/03/2023  Primary Physician: [name], PA Primary Cardiologist: Thomasene Ripple, DO  Electrophysiologist: Dr. Elberta Fortis   Referring Provider: Dr. Elease Hashimoto  Patient Profile: Kimberly Hobbs is a 62 y.o. female with a history of AF, CAD s/p CABG rheumatic heart disease s/p mechanical MVR / AVR, CKD, DM, HLD who is being seen today for the evaluation of wide complex tachycardia at the request of Dr. Melburn Popper.  HPI:  Kimberly Hobbs is a 62 y.o. female who presented to [facility] ER on 12/16 with reports of intermittent tachycardia and feeling lightheaded with sweating.   The patient was recently seen in EP Clinic 12/2022 and taken of amiodarone for AF in anticipation of Tikosyn loading.  Since that time she has noted increased shortness of breath with exertion, intermittent periods of fast heart rates at home with a sense of flushing/sweating. HR's at home up to 160-170's and intermittently in the 90's. She denies chest pain.  In the ER, she was seen to have a wide complex tachycardia that did not break with adenosine 12mg .  She as sedated by the EDP and underwent DCCV with 200j x1 with anterior manual pressure on the defib pad) with restoration of NSR.  Initial troponin x2 negative (7, 9 respectively), BNP 68, Cr 1.41 (at baseline), CXR showed mild vascular congestion.   She denies chest pain, PND, orthopnea, nausea, vomiting, dizziness, syncope, edema, weight gain, or early satiety.   Labs Potassium4.6 (12/16 1545) Magnesium  2.3 (12/16 1545) Creatinine, ser  1.41* (12/16 1545) PLT  384 (12/16 1545) HGB  12.7 (12/16 1545) WBC 11.3* (12/16 1545) Troponin I (High Sensitivity)9 (12/16 1627).    Past Medical History:  Diagnosis Date   Allergy ?   Arthritis    Back   CHF (congestive heart failure) (HCC)    CKD stage 3 due to type 2  diabetes mellitus (HCC) 05/15/2021   COPD (chronic obstructive pulmonary disease) (HCC)    COVID-19    GERD (gastroesophageal reflux disease)    H/O mitral valve replacement with mechanical #25 mechanical SJM 01/01/2017) 01/01/2017   Hyperlipidemia    Hypertension    Pain in both lower extremities 07/23/2019   Paroxysmal atrial fibrillation (HCC) 06/29/2015   Rheumatic heart disease    MS/ AS   Sleep apnea    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 01/01/2017 01/01/2017     Surgical History:  Past Surgical History:  Procedure Laterality Date   ATRIAL FIBRILLATION ABLATION N/A 09/05/2022   Procedure: ATRIAL FIBRILLATION ABLATION;  Surgeon: Regan Lemming, MD;  Location: [facility] INVASIVE CV LAB;  Service: Cardiovascular;  Laterality: N/A;   CARDIAC CATHETERIZATION     CARDIAC VALVE REPLACEMENT     CARDIOVERSION N/A 04/02/2022   Procedure: CARDIOVERSION;  Surgeon: Elder Negus, MD;  Location: [facility] ENDOSCOPY;  Service: Cardiovascular;  Laterality: N/A;   CARDIOVERSION N/A 04/27/2022   Procedure: CARDIOVERSION;  Surgeon: Elder Negus, MD;  Location: [facility] ENDOSCOPY;  Service: Cardiovascular;  Laterality: N/A;   CHOLECYSTECTOMY     CORONARY ARTERY BYPASS GRAFT     CORONARY/GRAFT ANGIOGRAPHY N/A 08/18/2018   Procedure: CORONARY/GRAFT ANGIOGRAPHY;  Surgeon: Elder Negus, MD;  Location: [facility] INVASIVE CV LAB;  Service: Cardiovascular;  Laterality: N/A;   LEG SURGERY     "metal plate in leg"   RIGHT HEART CATH AND  CORONARY/GRAFT ANGIOGRAPHY N/A 07/21/2018   Procedure: RIGHT HEART CATH AND CORONARY/GRAFT ANGIOGRAPHY;  Surgeon: Elder Negus, MD;  Location: [facility] INVASIVE CV LAB;  Service: Cardiovascular;  Laterality: N/A;   TEE WITHOUT CARDIOVERSION N/A 07/27/2015   Procedure: TRANSESOPHAGEAL ECHOCARDIOGRAM (TEE);  Surgeon: Thurmon Fair, MD;  Location[facility] ENDOSCOPY;  Service: Cardiovascular;  Laterality: N/A;   TEE WITHOUT CARDIOVERSION N/A  07/21/2018   Procedure: TRANSESOPHAGEAL ECHOCARDIOGRAM (TEE);  Surgeon: Elder Negus, MD;  Location: [facility] ENDOSCOPY;  Service: Cardiovascular;  Laterality: N/A;   TONSILLECTOMY AND ADENOIDECTOMY       Medications Prior to Admission  Medication Sig Dispense Refill Last Dose/Taking   Accu-Chek Softclix Lancets lancets Use as instructed (Patient taking differently: 1 each by Other route See admin instructions. Use as instructed) 100 each 12    aspirin EC 81 MG tablet Take 81 mg by mouth daily.       Blood Glucose Monitoring Suppl (ACCU-CHEK GUIDE) w/Device KIT Check glucose every morning (Patient taking differently: 1 each by Other route See admin instructions. Check glucose every morning) 1 kit 0    clonazePAM (KLONOPIN) 0.5 MG tablet Take 1.5 tablets (0.75 mg total) by mouth at bedtime. 45 tablet 0    cyclobenzaprine (FLEXERIL) 10 MG tablet Take 1 tablet (10 mg total) by mouth at bedtime as needed for muscle spasms. 30 tablet 3    diclofenac Sodium (VOLTAREN ARTHRITIS PAIN) 1 % GEL Apply 4 g topically 4 (four) times daily. (Patient taking differently: Apply 4 g topically 4 (four) times daily as needed (pain).) 150 g 5    diltiazem (CARDIZEM) 30 MG tablet Take 1 tablet every 4 hours AS NEEDED for AFIB heart rate >100 as long as top BP >100. 30 tablet 2    furosemide (LASIX) 20 MG tablet TAKE 2 TABLETS(40 MG) BY MOUTH TWICE DAILY (Patient taking differently: Take 40 mg by mouth 2 (two) times daily.) 60 tablet 3    furosemide (LASIX) 20 MG tablet Take 1 tablet (20 mg total) by mouth 2 (two) times daily. 60 tablet 3    gabapentin (NEURONTIN) 100 MG capsule Take 1 capsule (100 mg total) by mouth at bedtime. 30 capsule 5    glucose blood (ACCU-CHEK GUIDE) test strip Use as instructed (Patient taking differently: 1 each by Other route as needed for other (Glucose chesk). Use as instructed) 100 each 12    HYDROcodone-acetaminophen (NORCO) 7.5-325 MG tablet Take 1 tablet by mouth every 12 (twelve)  hours as needed for moderate pain (pain score 4-6). 60 tablet 0    ipratropium (ATROVENT) 0.03 % nasal spray Place 1 spray into both nostrils 2 (two) times daily as needed for rhinitis. 30 mL 0    isosorbide mononitrate (IMDUR) 60 MG 24 hr tablet Take 1 tablet (60 mg total) by mouth daily. 90 tablet 3    losartan (COZAAR) 50 MG tablet HOLD while on higher dose of metoprolol 12/11      Magnesium Oxide 400 MG CAPS Take 1 capsule (400 mg total) by mouth daily. 7 capsule 0    metFORMIN (GLUCOPHAGE) 500 MG tablet Take 1 tablet (500 mg total) by mouth 2 (two) times daily with a meal. 180 tablet 3    metoprolol succinate (TOPROL XL) 50 MG 24 hr tablet Take 1 tablet (50 mg total) by mouth 2 (two) times daily. 60 tablet 3    montelukast (SINGULAIR) 10 MG tablet Take 1 tablet (10 mg total) by mouth daily. 30 tablet 11    nitroGLYCERIN (  NITROSTAT) 0.4 MG SL tablet DISSOLVE 1 TABLET UNDER THE TONGUE EVERY 5 MINUTES FOR 3 DOSES AS NEEDED FOR CHEST PAIN (Patient taking differently: Place 0.4 mg under the tongue every 5 (five) minutes as needed for chest pain.) 25 tablet 3    ondansetron (ZOFRAN) 8 MG tablet Take 1 tablet (8 mg total) by mouth every 8 (eight) hours as needed for nausea or vomiting. 20 tablet 0    OVER THE COUNTER MEDICATION Take 1 tablet by mouth daily. [employer] Iron blood builder      pantoprazole (PROTONIX) 40 MG tablet Take 1 tablet (40 mg total) by mouth 2 (two) times daily before a meal. (Patient taking differently: Take 40 mg by mouth daily.) 180 tablet 3    phenazopyridine (PYRIDIUM) 100 MG tablet Take 100 mg by mouth 3 (three) times daily.      potassium chloride SA (KLOR-CON M) 20 MEQ tablet Take 1 tablet (20 mEq total) by mouth 2 (two) times daily. (Patient taking differently: Take 40 mEq by mouth daily.) 180 tablet 3    rosuvastatin (CRESTOR) 40 MG tablet Take 1 tablet (40 mg total) by mouth daily. 90 tablet 1    spironolactone (ALDACTONE) 50 MG tablet Take 1 tablet (50 mg total) by  mouth daily. 90 tablet 3    tiotropium (SPIRIVA HANDIHALER) 18 MCG inhalation capsule Place 18 mcg into inhaler and inhale daily.      Tiotropium Bromide-Olodaterol (STIOLTO RESPIMAT) 2.5-2.5 MCG/ACT AERS Inhale 2 puffs into the lungs daily. 4 g 6    vortioxetine HBr (TRINTELLIX) 5 MG TABS tablet Take 1 tablet (5 mg total) by mouth at bedtime. 30 tablet 2    warfarin (COUMADIN) 5 MG tablet Take 7.5 mg on Monday and Wed and 5 mg all other days (Patient taking differently: Take 5 mg by mouth See admin instructions. Take Thursday, Friday, Saturday and Sunday at bedtime) 90 tablet 3    warfarin (COUMADIN) 7.5 MG tablet Take 7.5 mg on Monday and Wednesday and 5 mg all other days (Patient taking differently: Take 7.5 mg by mouth See admin instructions. Take 7.5 mg on Monday, Tuesday and Wednesday at bedtime) 90 tablet 3     Inpatient Medications:   arformoterol  15 mcg Nebulization BID   And   umeclidinium bromide  1 puff Inhalation Daily   aspirin EC  81 mg Oral Daily   clonazePAM  0.5 mg Oral QHS   gabapentin  100 mg Oral QHS   [START ON 02/04/2023] insulin aspart  0-9 Units Subcutaneous TID WC   [START ON 02/04/2023] magnesium oxide  400 mg Oral Daily   metoprolol succinate  50 mg Oral BID   [START ON 02/04/2023] montelukast  10 mg Oral Daily   pantoprazole  40 mg Oral BID AC   [START ON 02/04/2023] rosuvastatin  40 mg Oral Daily    Allergies:  Allergies  Allergen Reactions   Iodinated Contrast Media Other (See Comments)    Patient is unsure of reaction type   Duloxetine Hcl     Made her feel bad emotionally     Penicillins Other (See Comments)    Immune to drug , does not work per patient  Did it involve swelling of the face/tongue/throat, SOB, or low BP? No  Did it involve sudden or severe rash/hives, skin peeling, or any reaction on the inside of your mouth or nose? No  Did you need to seek medical attention at a hospital or doctor's office? No  When  did it last happen? NOT A  TRUE ALLERGY    If all above answers are "NO", may proceed with cephalosporin use.  Immune to drug , does not work per patient  Did it involve swelling of the face/tongue/throat, SOB, or low BP? No Did it involve sudden or severe rash/hives, skin peeling, or any reaction on the inside of your mouth or nose? No Did you need to seek medical attention at a hospital or doctor's office? No When did it last happen? NOT A TRUE ALLERGY   If all above answers are "NO", may proceed with cephalosporin use., , Immune to drug , does not work per patient    Family History  Problem Relation Age of Onset   Cancer Mother    Hypertension Mother    Diabetes Mother    Hyperlipidemia Mother    Heart disease Mother    Cancer Father    Hypertension Father    Diabetes Father    Heart disease Father    Hypertension Sister    Schizophrenia Maternal Grandmother    Hypertension Brother    Hypertension Brother    Cancer Daughter    COPD Daughter    Breast cancer Neg Hx      Physical Exam: Vitals:   02/03/23 1600 02/03/23 1630 02/03/23 1640 02/03/23 1700  BP: (!) 91/49 (!) 121/51  105/69  Pulse: 63 66  62  Resp:  (!) 25  17  Temp:      TempSrc:      SpO2: 99% 93% 90% 98%  Weight:      Height:        GEN- NAD, A&O x 3, normal affect HEENT: Normocephalic, atraumatic Lungs- CTAB, Normal effort.  Heart- Regular rate and rhythm, No M/G/R.  GI- Soft, NT, ND.  Extremities- No clubbing, cyanosis, or edema   Radiology/Studies: DG Chest 1 View Result Date: 02/03/2023 CLINICAL DATA:  Chest pain. EXAM: CHEST  1 VIEW COMPARISON:  April 25, 2022 FINDINGS: Multiple sternal wires and vascular clips are seen. The cardiac silhouette is mildly enlarged and unchanged in size. An artificial cardiac valve is noted. Mild prominence of the pulmonary vasculature is seen. There is marked severity calcification of the aortic arch. Low lung volumes are noted. There is no evidence of an acute infiltrate, pleural effusion or  pneumothorax. The visualized skeletal structures are unremarkable. IMPRESSION: 1. Evidence of prior median sternotomy/CABG. 2. Low lung volumes and mild pulmonary vascular congestion without frank pulmonary edema. Electronically Signed   By: Aram Candela M.D.   On: 02/03/2023 14:40   LONG TERM MONITOR (3-14 DAYS) Result Date: 01/12/2023 Patch Wear Time:  3 days and 2 hours (2024-11-15T12:42:14-0500 to 2024-11-18T15:17:38-0500) Patient had a min HR of 48 bpm, max HR of 130 bpm, and avg HR of 62 bpm. Predominant underlying rhythm was Sinus Rhythm. Isolated SVEs were rare (<1.0%), SVE Couplets were rare (<1.0%), and no SVE Triplets were present. Isolated VEs were rare (<1.0%), and no VE Couplets or VE Triplets were present. No symptoms reported Conclusion: Normal/unremarkable study.    EKG: 12/16 presenting > wide complex tachycardia (personally reviewed)  TELEMETRY: SR 60-70's post DCCV (personally reviewed)    Assessment/Plan:  Wide Complex Tachycardia  No underlying flutter waves seen with adenosine 12mg . S/p DCCV x1 at 200j with anterior manual pressure.  NSR restored. Query if presenting rhythm VT vs AFL  -admit to ICU -continue tele monitoring  -avoiding amiodarone for possible EP study / washout from prior  -rule out graft  CAD  -continue Toprol-XL  AF/AFL Hx AF ablation 09/05/22, CHA2DS2-VASc 5 -recurrent AFL, metoprolol was increased on 01/20/23 -OAC with warfarin   Secondary Hypercoagulable State  -warfarin per pharmacy   CAD  Rheumatic Valve Disease s/p Mechanical MVR/AVR 2018 -continue coumadin  -Imdur, spironolactone on hold with soft pressures   DM  -SSI  -hold home metformin   OSA  -nocturnal CPAP ordered   For questions or updates, please contact [facility] Please consult [email or web address] for contact info under Cardiology/STEMI.  Signed, Canary Brim, MSN, APRN, NP-C, AGACNP-BC [facility] - Electrophysiology  02/03/2023, 5:49 PM   I have  seen and examined this patient with Canary Brim.  Agree with above, note added to reflect my findings.  Patient was admitted after being found to be tachycardic at home.  She has a history of atrial fibrillation and is post ablation x 2.  She was started on amiodarone due to continued episodes of atrial fibrillation.  Amiodarone was stopped and prepped for dofetilide load.  At home, she has had multiple episodes of tachycardia with heart rates in the 160s and intermittently in the 90s.  Her baseline heart rate is in the 60s.  In the emergency room, she was given 6 mg and 12 mg of adenosine with symptoms of flushing.  She had no evidence of swelling in her heart rhythm.  She required cardioversion in the emergency room.  GEN: Well nourished, well developed, in no acute distress  HEENT: normal  Neck: no JVD, carotid bruits, or masses Cardiac: RRR; no murmurs, rubs, or gallops,no edema  Respiratory:  clear to auscultation bilaterally, normal work of breathing GI: soft, nontender, nondistended, + BS MS: no deformity or atrophy  Skin: warm and dry Neuro:  Strength and sensation are intact Psych: euthymic mood, full affect   Wide-complex tachycardia: Most likely related to ventricular tachycardia.  She is post cardioversion and remains in sinus rhythm.  Samentha Perham plan for left heart catheterization tomorrow.  She may also require EP study for VT and other atrial arrhythmias. Coronary artery disease: Has not had a catheterization since 2020.  She has significant disease of her grafts to her RCA.  No current chest pain. Persistent atrial fibrillation: Remains in sinus rhythm post cardioversion.  Tevon Berhane hold anticoagulation and prep for procedures.  Coco Sharpnack M. Demarques Pilz MD 02/03/2023 8:34 PM

## 2023-02-03 NOTE — ED Notes (Signed)
CCMD called. PT on monitor.

## 2023-02-03 NOTE — H&P (Signed)
Cardiology Consultation   Patient ID: Rheanne Maggiore MRN: [id number]; DOB: [birth year]  Admit date: 02/03/2023 Date of Consult: 02/03/2023  PCP:  [name], PA   [facility] Providers Cardiologist:  Thomasene Ripple, DO  Electrophysiologist:  [name], MD  Sleep Medicine:  Armanda Magic, MD       Patient Profile:   Kimberly Hobbs is a 62 y.o. female with a hx of rheumatic heart disease, MVR,  AVR atrial fib  who is being seen 02/03/2023 for the evaluation of wide complex tachycardia        History of Present Illness:   Kimberly Hobbs has a history of coronary artery disease, status post coronary artery bypass grafting and mitral valve replacement, AVR  She has a history of CKD, diabetes mellitus, hyperlipidemia.  She has been intermittantly tachycardic for the pat several weeks  Has been working with the Afib clinic in an attempt to slow her HR   She presented to the ER today lightheadedness, diaphoresis, high HR .   No angina          Past Medical History:  Diagnosis Date   Allergy ?   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 01/01/2017 01/01/2017    Past Surgical History:  Procedure Laterality Date   ATRIAL FIBRILLATION ABLATION N/A 09/05/2022   Procedure: ATRIAL FIBRILLATION ABLATION;  Surgeon: Regan Lemming, MD;  Location: [facility] INVASIVE [facility];  Service: Cardiovascular;  Laterality: N/A;   CARDIAC  CATHETERIZATION     CARDIAC VALVE REPLACEMENT     CARDIOVERSION N/A 04/02/2022   Procedure: CARDIOVERSION;  Surgeon: Elder Negus, MD;  Location: [facility] ENDOSCOPY;  Service: Cardiovascular;  Laterality: N/A;   CARDIOVERSION N/A 04/27/2022   Procedure: CARDIOVERSION;  Surgeon: Elder Negus, MD;  Location: [facility] ENDOSCOPY;  Service: Cardiovascular;  Laterality: N/A;   CHOLECYSTECTOMY     CORONARY ARTERY BYPASS GRAFT     CORONARY/GRAFT ANGIOGRAPHY N/A 08/18/2018   Procedure: CORONARY/GRAFT ANGIOGRAPHY;  Surgeon: Elder Negus, MD;  Location: [facility]AS[facility] CV [facility];  Service: Cardiovascular;  Laterality: N/A;   LEG SURGERY     "metal plate in leg"   RIGHT HEART CATH AND CORONARY/GRAFT ANGIOGRAPHY N/A 07/21/2018   Procedure: RIGHT HEART CATH AND CORONARY/GRAFT ANGIOGRAPHY;  Surgeon: Elder Negus, MD;  Location: [facility];  Service: Cardiovascular;  Laterality: N/A;   TEE WITHOUT CARDIOVERSION N/A 07/27/2015   Procedure: TRANSESOPHAGEAL ECHOCARDIOGRAM (TEE);  Surgeon: Thurmon Fair, MD;  Location: [facility] ENDOSCOPY;  Service: Cardiovascular;  Laterality: N/A;   TEE WITHOUT CARDIOVERSION N/A 07/21/2018   Procedure: TRANSESOPHAGEAL ECHOCARDIOGRAM (TEE);  Surgeon: Elder Negus, MD;  Location: [facility] ENDOSCOPY;  Service: Cardiovascular;  Laterality: N/A;   TONSILLECTOMY AND ADENOIDECTOMY       Home Medications:  Prior to Admission medications   Medication Sig Start Date End Date Taking? Authorizing Provider  Accu-Chek Softclix Lancets  lancets Use as instructed Patient taking differently: 1 each by Other route See admin instructions. Use as instructed 05/23/22   Nche, [name], NP  aspirin EC 81 MG tablet Take 81 mg by mouth daily.     [provider]  Blood Glucose Monitoring Suppl (ACCU-CHEK GUIDE) w/Device KIT Check glucose every morning Patient taking differently: 1 each by Other route See admin instructions. Check glucose every morning 05/21/22   Nche,  [name], NP  clonazePAM (KLONOPIN) 0.5 MG tablet Take 1.5 tablets (0.75 mg total) by mouth at bedtime. 01/23/23   [name], PA  cyclobenzaprine (FLEXERIL) 10 MG tablet Take 1 tablet (10 mg total) by mouth at bedtime as needed for muscle spasms. 12/25/22   [name], MD  diclofenac Sodium (VOLTAREN ARTHRITIS PAIN) 1 % GEL Apply 4 g topically 4 (four) times daily. Patient taking differently: Apply 4 g topically 4 (four) times daily as needed (pain). 08/23/22   [name], MD  diltiazem (CARDIZEM) 30 MG tablet Take 1 tablet every 4 hours AS NEEDED for AFIB heart rate >100 as long as top BP >100. 01/29/23   [name], [name], PA  furosemide (LASIX) 20 MG tablet TAKE 2 TABLETS(40 MG) BY MOUTH TWICE DAILY Patient taking differently: Take 40 mg by mouth 2 (two) times daily. 09/03/22   [name], [name], MD  furosemide (LASIX) 20 MG tablet Take 1 tablet (20 mg total) by mouth 2 (two) times daily. 01/27/23 04/27/23  Tobb, Kardie, DO  gabapentin (NEURONTIN) 100 MG capsule Take 1 capsule (100 mg total) by mouth at bedtime. 12/23/22   Saralyn Pilar A, PA  glucose blood (ACCU-CHEK GUIDE) test strip Use as instructed Patient taking differently: 1 each by Other route as needed for other (Glucose chesk). Use as instructed 05/21/22   Nche, [name], NP  HYDROcodone-acetaminophen (NORCO) 7.5-325 MG tablet Take 1 tablet by mouth every 12 (twelve) hours as needed for moderate pain (pain score 4-6). 12/25/22   [name], MD  ipratropium (ATROVENT) 0.03 % nasal spray Place 1 spray into both nostrils 2 (two) times daily as needed for rhinitis. 07/31/22   Nche, [name], NP  isosorbide mononitrate (IMDUR) 60 MG 24 hr tablet Take 1 tablet (60 mg total) by mouth daily. 09/13/22   [name], [name], MD  losartan (COZAAR) 50 MG tablet HOLD while on higher dose of metoprolol 12/11 01/29/23   [name], [name], PA  Magnesium Oxide 400 MG CAPS Take 1 capsule (400 mg total) by mouth daily.  01/21/23   Reather Littler D, NP  metFORMIN (GLUCOPHAGE) 500 MG tablet Take 1 tablet (500 mg total) by mouth 2 (two) times daily with a meal. 12/23/22   Saralyn Pilar A, PA  metoprolol succinate (TOPROL XL) 50 MG 24 hr tablet Take 1 tablet (50 mg total) by mouth 2 (two) times daily. 01/29/23   [name], [name], PA  montelukast (SINGULAIR) 10 MG tablet Take 1 tablet (10 mg total) by mouth daily. 05/27/22 05/27/23  Charlott Holler, MD  nitroGLYCERIN (NITROSTAT) 0.4 MG SL tablet DISSOLVE 1 TABLET UNDER THE TONGUE EVERY 5 MINUTES FOR 3 DOSES AS NEEDED FOR CHEST PAIN Patient taking differently: Place 0.4 mg under the tongue every 5 (five) minutes as needed for chest pain. 05/27/22   [name], Manish J, MD  ondansetron (ZOFRAN) 8 MG tablet Take 1 tablet (8 mg total) by mouth every 8 (eight) hours as needed for nausea or vomiting. 01/27/23   [name], PA  OVER  THE COUNTER MEDICATION Take 1 tablet by mouth daily. [employer] Iron blood International aid/development worker, Historical, MD  pantoprazole (PROTONIX) 40 MG tablet Take 1 tablet (40 mg total) by mouth 2 (two) times daily before a meal. Patient taking differently: Take 40 mg by mouth daily. 11/23/21   Charlott Holler, MD  phenazopyridine (PYRIDIUM) 100 MG tablet Take 100 mg by mouth 3 (three) times daily. 08/11/22   [provider]  potassium chloride SA (KLOR-CON M) 20 MEQ tablet Take 1 tablet (20 mEq total) by mouth 2 (two) times daily. Patient taking differently: Take 40 mEq by mouth daily. 07/12/22   [name], [name], MD  rosuvastatin (CRESTOR) 40 MG tablet Take 1 tablet (40 mg total) by mouth daily. 08/29/22   [name], [name], MD  spironolactone (ALDACTONE) 50 MG tablet Take 1 tablet (50 mg total) by mouth daily. 09/03/22   [name], [name], MD  tiotropium (SPIRIVA HANDIHALER) 18 MCG inhalation capsule Place 18 mcg into inhaler and inhale daily.    [provider]  Tiotropium Bromide-Olodaterol (STIOLTO RESPIMAT) 2.5-2.5 MCG/ACT AERS  Inhale 2 puffs into the lungs daily. 09/03/22   Charlott Holler, MD  vortioxetine HBr (TRINTELLIX) 5 MG TABS tablet Take 1 tablet (5 mg total) by mouth at bedtime. 01/24/23   [name], PA  warfarin (COUMADIN) 5 MG tablet Take 7.5 mg on Monday and Wed and 5 mg all other days Patient taking differently: Take 5 mg by mouth See admin instructions. Take Thursday, Friday, Saturday and Sunday at bedtime 08/20/22   Yates Decamp, MD  warfarin (COUMADIN) 7.5 MG tablet Take 7.5 mg on Monday and Wednesday and 5 mg all other days Patient taking differently: Take 7.5 mg by mouth See admin instructions. Take 7.5 mg on Monday, Tuesday and Wednesday at bedtime 08/20/22   Yates Decamp, MD    Inpatient Medications: Scheduled Meds:  adenosine       etomidate  10 mg Intravenous Once   Continuous Infusions:  magnesium sulfate bolus IVPB     PRN Meds: adenosine  Allergies:    Allergies  Allergen Reactions   Iodinated Contrast Media Other (See Comments)    Patient is unsure of reaction type   Duloxetine Hcl     Made her feel bad emotionally     Penicillins Other (See Comments)    Immune to drug , does not work per patient  Did it involve swelling of the face/tongue/throat, SOB, or low BP? No  Did it involve sudden or severe rash/hives, skin peeling, or any reaction on the inside of your mouth or nose? No  Did you need to seek medical attention at a hospital or doctor's office? No  When did it last happen? NOT A TRUE ALLERGY    If all above answers are "NO", may proceed with cephalosporin use.  Immune to drug , does not work per patient  Did it involve swelling of the face/tongue/throat, SOB, or low BP? No Did it involve sudden or severe rash/hives, skin peeling, or any reaction on the inside of your mouth or nose? No Did you need to seek medical attention at a hospital or doctor's office? No When did it last happen? NOT A TRUE ALLERGY   If all above answers are "NO", may proceed with cephalosporin  use., , Immune to drug , does not work per patient    Social History:   Social History   Socioeconomic History   Marital status: Divorced  Spouse name: Not on file   Number of children: 4   Years of education: Not on file   Highest education level: GED or equivalent  Occupational History   Not on file  Tobacco Use   Smoking status: Former    Current packs/day: 0.00    Average packs/day: 1.6 packs/day for 50.0 years (80.0 ttl pk-yrs)    Types: Cigarettes    Start date: 12/23/1976    Quit date: 12/23/2016    Years since quitting: 6.1    Passive exposure: Past   Smokeless tobacco: Never   Tobacco comments:    Former smoker 01/24/23  Vaping Use   Vaping status: Never Used  Substance and Sexual Activity   Alcohol use: Never   Drug use: Yes    Types: Hydrocodone   Sexual activity: Not Currently  Other Topics Concern   Not on file  Social History Narrative   ** Merged History Encounter **       Epworth Sleepiness Scale = 4 (as of 06/28/2015)   Social Drivers of Health   Financial Resource Strain: Low Risk  (10/14/2022)   Overall Financial Resource Strain (CARDIA)    Difficulty of Paying Living Expenses: Not hard at all  Food Insecurity: No Food Insecurity (10/14/2022)   Hunger Vital Sign    Worried About Running Out of Food in the Last Year: Never true    Ran Out of Food in the Last Year: Never true  Transportation Needs: No Transportation Needs (10/14/2022)   PRAPARE - Administrator, Civil Service (Medical): No    Lack of Transportation (Non-Medical): No  Physical Activity: Inactive (10/14/2022)   Exercise Vital Sign    Days of Exercise per Week: 0 days    Minutes of Exercise per Session: 0 min  Stress: Stress Concern Present (10/14/2022)   [employer] of Occupational Health - Occupational Stress Questionnaire    Feeling of Stress : To some extent  Social Connections: Moderately Isolated (10/14/2022)   Social Connection and Isolation Panel [NHANES]     Frequency of Communication with Friends and Family: More than three times a week    Frequency of Social Gatherings with Friends and Family: Never    Attends Religious Services: Never    Database administrator or Organizations: No    Attends Banker Meetings: Never    Marital Status: Living with partner  Intimate Partner Violence: Not At Risk (10/14/2022)   Humiliation, Afraid, Rape, and Kick questionnaire    Fear of Current or Ex-Partner: No    Emotionally Abused: No    Physically Abused: No    Sexually Abused: No    Family History:    Family History  Problem Relation Age of Onset   Cancer Mother    Hypertension Mother    Diabetes Mother    Hyperlipidemia Mother    Heart disease Mother    Cancer Father    Hypertension Father    Diabetes Father    Heart disease Father    Hypertension Sister    Schizophrenia Maternal Grandmother    Hypertension Brother    Hypertension Brother    Cancer Daughter    COPD Daughter    Breast cancer Neg Hx      ROS:  Please see the history of present illness.   All other ROS reviewed and negative.     Physical Exam/Data:   Vitals:   02/03/23 1215 02/03/23 1218  BP: (!) 118/92 (!) 118/92  Pulse:  Marland Kitchen)  128  Resp: 16 16  Temp: 98.2 F (36.8 C) 98.2 F (36.8 C)  TempSrc: Oral Oral  SpO2: 96% 96%   No intake or output data in the 24 hours ending 02/03/23 1442    01/24/2023    8:35 AM 01/23/2023    1:36 PM 01/20/2023    2:18 PM  Last 3 Weights  Weight (lbs) 208 lb 9.6 oz 212 lb 209 lb 12.8 oz  Weight (kg) 94.62 kg 96.163 kg 95.165 kg     There is no height or weight on file to calculate BMI.  General:  moderately obese female ,   HEENT: normal Neck: no JVD Vascular: No carotid bruits; Distal pulses 2+ bilaterally Cardiac:  RR , tachy,  mechanical S1, S2  Lungs:  clear to auscultation bilaterally, no wheezing, rhonchi or rales  Abd: soft, nontender, no hepatomegaly  Ext: no edema Musculoskeletal:  No deformities, BUE  and BLE strength normal and equal Skin: warm and dry  Neuro:  CNs 2-12 intact, no focal abnormalities noted Psych:  Normal affect   EKG:  The EKG was personally reviewed and demonstrates:  Wide complex tachycardia - possibly VT  ,  HR is 160   Telemetry:  Telemetry was personally reviewed and demonstrates:    Persistent WCT.   Did not respond to Adenosine 6 mg IVP or Adenosine 12 mg IVP .    Relevant CV Studies:   Laboratory Data:  High Sensitivity Troponin:  No results for input(s): "TROPONINIHS" in the last 720 hours.   ChemistryNo results for input(s): "NA", "K", "CL", "CO2", "GLUCOSE", "BUN", "CREATININE", "CALCIUM", "MG", "GFRNONAA", "GFRAA", "ANIONGAP" in the last 168 hours.  No results for input(s): "PROT", "ALBUMIN", "AST", "ALT", "ALKPHOS", "BILITOT" in the last 168 hours. Lipids No results for input(s): "CHOL", "TRIG", "HDL", "LABVLDL", "LDLCALC", "CHOLHDL" in the last 168 hours.  HematologyNo results for input(s): "WBC", "RBC", "HGB", "HCT", "MCV", "MCH", "MCHC", "RDW", "PLT" in the last 168 hours. Thyroid No results for input(s): "TSH", "FREET4" in the last 168 hours.  BNPNo results for input(s): "BNP", "PROBNP" in the last 168 hours.  DDimer No results for input(s): "DDIMER" in the last 168 hours.   Radiology/Studies:  No results found.   Assessment and Plan:   Wide complex tachycardia:   we were not able to demonstrate flutter waves with Adenosine.   She was successfully cardioverted with 200 J x 1 ( with anterior manual pressure on the defib pad)   She feels much better   I have reviewed with Dr  Elberta Fortis. She will need to be admitted for VT work up  Cath EP study   Will defer further evaluation to the EP team   2.   CAD :   s/p CABG  3    Rheumatic heart disease   :   s/p MVR and AVR at [facility] in 2018.   Is theraputic on her warfarin   4  DM      Risk Assessment/Risk Scores:          CHA2DS2-VASc Score = 5  his indicates a 7.2% annual risk of  stroke. The patient's score is based upon: CHF History: 1 HTN History: 1 Diabetes History: 1 Stroke History: 0 Vascular Disease History: 1 Age Score: 0 Gender Score: 1       For questions or updates, please contact [facility] Please consult [email or web address] for contact info under    Signed, Kristeen Miss, MD  02/03/2023 2:42 PM

## 2023-02-03 NOTE — Progress Notes (Signed)
PHARMACY - ANTICOAGULATION CONSULT NOTE  Pharmacy Consult for heparin bridge from warfarin Indication: atrial fibrillation and mechanical MVR and mechanical AVR  Allergies  Allergen Reactions   Iodinated Contrast Media Other (See Comments)    Patient is unsure of reaction type   Duloxetine Hcl     Made her feel bad emotionally     Penicillins Other (See Comments)    Immune to drug , does not work per patient  Did it involve swelling of the face/tongue/throat, SOB, or low BP? No  Did it involve sudden or severe rash/hives, skin peeling, or any reaction on the inside of your mouth or nose? No  Did you need to seek medical attention at a hospital or doctor's office? No  When did it last happen? NOT A TRUE ALLERGY    If all above answers are "NO", may proceed with cephalosporin use.  Immune to drug , does not work per patient  Did it involve swelling of the face/tongue/throat, SOB, or low BP? No Did it involve sudden or severe rash/hives, skin peeling, or any reaction on the inside of your mouth or nose? No Did you need to seek medical attention at a hospital or doctor's office? No When did it last happen? NOT A TRUE ALLERGY   If all above answers are "NO", may proceed with cephalosporin use., , Immune to drug , does not work per patient    Patient Measurements: Height: 5\' 5"  (165.1 cm) Weight: 94.6 kg (208 lb 9.6 oz) IBW/kg (Calculated) : 57 Heparin Dosing Weight: 78.3 kg  Vital Signs: Temp: 98.5 F (36.9 C) (12/16 1502) Temp Source: Oral (12/16 1502) BP: 105/69 (12/16 1700) Pulse Rate: 62 (12/16 1700)  Labs: Recent Labs    02/03/23 1545 02/03/23 1627  HGB 12.7  --   HCT 38.2  --   PLT 384  --   LABPROT  --  28.0*  INR  --  2.6*  CREATININE 1.41*  --   TROPONINIHS 7 9    Estimated Creatinine Clearance: 47.6 mL/min (A) (by C-G formula based on SCr of 1.41 mg/dL (H)).   Medical History: Past Medical History:  Diagnosis Date   Allergy ?   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 Use as instructed) 100 each 12    aspirin EC 81 MG tablet Take 81 mg by mouth daily.       Blood Glucose Monitoring Suppl (ACCU-CHEK GUIDE) w/Device KIT Check glucose every morning (Patient taking differently: 1 each by Other route See admin instructions. Check glucose every morning) 1 kit 0    clonazePAM (KLONOPIN) 0.5 MG tablet Take 1.5 tablets (0.75 mg total) by mouth at bedtime. 45 tablet  0    cyclobenzaprine (FLEXERIL) 10 MG tablet Take 1 tablet (10 mg total) by mouth at bedtime as needed for muscle spasms. 30 tablet 3    diclofenac Sodium (VOLTAREN ARTHRITIS PAIN) 1 % GEL Apply 4 g topically 4 (four) times daily. (Patient taking differently: Apply 4 g topically 4 (four) times daily as needed (pain).) 150 g 5    diltiazem (CARDIZEM) 30 MG tablet Take 1 tablet every 4 hours AS NEEDED for AFIB heart rate >100 as long as top BP >100. 30 tablet 2    furosemide (LASIX) 20 MG tablet TAKE 2 TABLETS(40 MG) BY MOUTH TWICE DAILY (Patient taking differently: Take 40 mg by mouth 2 (two) times daily.) 60 tablet 3    furosemide (LASIX) 20 MG tablet Take 1 tablet (20 mg total) by mouth 2 (two) times daily. 60 tablet 3     gabapentin (NEURONTIN) 100 MG capsule Take 1 capsule (100 mg total) by mouth at bedtime. 30 capsule 5    glucose blood (ACCU-CHEK GUIDE) test strip Use as instructed (Patient taking differently: 1 each by Other route as needed for other (Glucose chesk). Use as instructed) 100 each 12    HYDROcodone-acetaminophen (NORCO) 7.5-325 MG tablet Take 1 tablet by mouth every 12 (twelve) hours as needed for moderate pain (pain score 4-6). 60 tablet 0    ipratropium (ATROVENT) 0.03 % nasal spray Place 1 spray into both nostrils 2 (two) times daily as needed for rhinitis. 30 mL 0    isosorbide mononitrate (IMDUR) 60 MG 24 hr tablet Take 1 tablet (60 mg total) by mouth daily. 90 tablet 3    losartan (COZAAR) 50 MG tablet HOLD while on higher dose of metoprolol 12/11      Magnesium Oxide 400 MG CAPS Take 1 capsule (400 mg total) by mouth daily. 7 capsule 0    metFORMIN (GLUCOPHAGE) 500 MG tablet Take 1 tablet (500 mg total) by mouth 2 (two) times daily with a meal. 180 tablet 3    metoprolol succinate (TOPROL XL) 50 MG 24 hr tablet Take 1 tablet (50 mg total) by mouth 2 (two) times daily. 60 tablet 3    montelukast (SINGULAIR) 10 MG tablet Take 1 tablet (10 mg total) by mouth daily. 30 tablet 11    nitroGLYCERIN (NITROSTAT) 0.4 MG SL tablet DISSOLVE 1 TABLET UNDER THE TONGUE EVERY 5 MINUTES FOR 3 DOSES AS NEEDED FOR CHEST PAIN (Patient taking differently: Place 0.4 mg under the tongue every 5 (five) minutes as needed for chest pain.) 25 tablet 3    ondansetron (ZOFRAN) 8 MG tablet Take 1 tablet (8 mg total) by mouth every 8 (eight) hours as needed for nausea or vomiting. 20 tablet 0    OVER THE COUNTER MEDICATION Take 1 tablet by mouth daily. [employer] Iron blood builder      pantoprazole (PROTONIX) 40 MG tablet Take 1 tablet (40 mg total) by mouth 2 (two) times daily before a meal. (Patient taking differently: Take 40 mg by mouth daily.) 180 tablet 3    phenazopyridine (PYRIDIUM) 100 MG tablet Take 100 mg by mouth  3 (three) times daily.      potassium chloride SA (KLOR-CON M) 20 MEQ tablet Take 1 tablet (20 mEq total) by mouth 2 (two) times daily. (Patient taking differently: Take 40 mEq by mouth daily.) 180 tablet 3    rosuvastatin (CRESTOR) 40 MG tablet Take 1 tablet (40 mg total) by mouth daily. 90 tablet 1    spironolactone (ALDACTONE)  50 MG tablet Take 1 tablet (50 mg total) by mouth daily. 90 tablet 3    tiotropium (SPIRIVA HANDIHALER) 18 MCG inhalation capsule Place 18 mcg into inhaler and inhale daily.      Tiotropium Bromide-Olodaterol (STIOLTO RESPIMAT) 2.5-2.5 MCG/ACT AERS Inhale 2 puffs into the lungs daily. 4 g 6    vortioxetine HBr (TRINTELLIX) 5 MG TABS tablet Take 1 tablet (5 mg total) by mouth at bedtime. 30 tablet 2    warfarin (COUMADIN) 5 MG tablet Take 7.5 mg on Monday and Wed and 5 mg all other days (Patient taking differently: Take 5 mg by mouth See admin instructions. Take Thursday, Friday, Saturday and Sunday at bedtime) 90 tablet 3    warfarin (COUMADIN) 7.5 MG tablet Take 7.5 mg on Monday and Wednesday and 5 mg all other days (Patient taking differently: Take 7.5 mg by mouth See admin instructions. Take 7.5 mg on Monday, Tuesday and Wednesday at bedtime) 90 tablet 3     Assessment: 62 yo F presents with wide-complex tachycardia now in NSR s/p DCCV x1 shock. Patient has a history of rheumatic heart disease and is s/p mechanical MVR and mechanical AVR in 2018 and Afib anticoagulated with warfarin with goal INR of 2.5-3.5 PTA. Pt was last seen in anticoag clinic on 01/28/23 with INR of 3.2 and instructed to take warfarin 5mg  TRSaSu and 7.5mg  on MWF. Patient is being admitted for VT work-up per cardiology recs and pharmacy has been consulted to bridge heparin while off of warfarin.   Last warfarin dose:   INR 2.6 Hgb 12.7, Plt 384 No s/sx of bleeding  Goal of Therapy:  Heparin level 0.3-0.7 units/ml INR 2.5-3.5 Monitor platelets by anticoagulation protocol: Yes   Plan:  Start  Heparin (no bolus with INR 2.6) at 1450 units/hr.  Heparin level in 6 hours.  Daily Heparin level and CBC while on therapy.  Follow-up plan to restart Warfarin.   Link Snuffer, PharmD, BCPS, BCCCP Please refer to [facility] for [facility] numbers 02/03/2023 5:34 PM   Please refer to [facility] for pharmacy phone number

## 2023-02-03 NOTE — Telephone Encounter (Signed)
Patient called in regarding her A-fib. She has been having headaches, tightness in her chest and feeling nauseous. HR 150-170 range. She has taken Metoprolol 50 mg and Diltiazem 30 mg at 7 am. HR is currently 156-165 and B/P 120/100. Instructed patient per Clint [name]-PA to take another 30 mg Diltiazem at 10 am. Advised patient to contact our clinic back this afternoon to let us know how her heart rates are. If she starts feeling much worse she was told to go to the ER to be assessed. Consulted with patient and she verbalized understanding.

## 2023-02-04 ENCOUNTER — Other Ambulatory Visit: Payer: Self-pay

## 2023-02-04 ENCOUNTER — Encounter (HOSPITAL_COMMUNITY)
Admission: EM | Disposition: A | Discharge status: Home / Self Care | Payer: Self-pay | Source: Home / Self Care | Attending: Cardiology

## 2023-02-04 ENCOUNTER — Inpatient Hospital Stay (HOSPITAL_COMMUNITY): Payer: 59 | Admitting: Anesthesiology

## 2023-02-04 ENCOUNTER — Ambulatory Visit (HOSPITAL_COMMUNITY): Payer: 59 | Admitting: Physician Assistant

## 2023-02-04 DIAGNOSIS — I472 Ventricular tachycardia, unspecified: Secondary | ICD-10-CM

## 2023-02-04 DIAGNOSIS — I4891 Unspecified atrial fibrillation: Secondary | ICD-10-CM

## 2023-02-04 HISTORY — PX: ELECTROPHYSIOLOGY STUDY: EP1205

## 2023-02-04 LAB — BASIC METABOLIC PANEL
Anion gap: 10 (ref 5–15)
Anion gap: 11 (ref 5–15)
BUN: 23 mg/dL (ref 8–23)
BUN: 27 mg/dL — ABNORMAL HIGH (ref 8–23)
CO2: 20 mmol/L — ABNORMAL LOW (ref 22–32)
CO2: 24 mmol/L (ref 22–32)
Calcium: 9.2 mg/dL (ref 8.9–10.3)
Calcium: 9.3 mg/dL (ref 8.9–10.3)
Chloride: 103 mmol/L (ref 98–111)
Chloride: 105 mmol/L (ref 98–111)
Creatinine, Ser: 1.44 mg/dL — ABNORMAL HIGH (ref 0.44–1.00)
Creatinine, Ser: 1.66 mg/dL — ABNORMAL HIGH (ref 0.44–1.00)
GFR, Estimated: 35 mL/min — ABNORMAL LOW (ref >60)
GFR, Estimated: 41 mL/min — ABNORMAL LOW (ref >60)
Glucose, Bld: 111 mg/dL — ABNORMAL HIGH (ref 70–99)
Glucose, Bld: 216 mg/dL — ABNORMAL HIGH (ref 70–99)
Potassium: 4.2 mmol/L (ref 3.5–5.1)
Potassium: 4.4 mmol/L (ref 3.5–5.1)
Sodium: 133 mmol/L — ABNORMAL LOW (ref 135–145)
Sodium: 140 mmol/L (ref 135–145)

## 2023-02-04 LAB — CBC
HCT: 33.1 % — ABNORMAL LOW (ref 36.0–46.0)
Hemoglobin: 10.8 g/dL — ABNORMAL LOW (ref 12.0–15.0)
MCH: 29.6 pg (ref 26.0–34.0)
MCHC: 32.6 g/dL (ref 30.0–36.0)
MCV: 90.7 fL (ref 80.0–100.0)
Platelets: 249 10*3/uL (ref 150–400)
RBC: 3.65 MIL/uL — ABNORMAL LOW (ref 3.87–5.11)
RDW: 15.2 % (ref 11.5–15.5)
WBC: 8.1 10*3/uL (ref 4.0–10.5)
nRBC: 0 % (ref 0.0–0.2)

## 2023-02-04 LAB — GLUCOSE, CAPILLARY
Glucose-Capillary: 108 mg/dL — ABNORMAL HIGH (ref 70–99)
Glucose-Capillary: 105 mg/dL — ABNORMAL HIGH (ref 70–99)
Glucose-Capillary: 112 mg/dL — ABNORMAL HIGH (ref 70–99)
Glucose-Capillary: 127 mg/dL — ABNORMAL HIGH (ref 70–99)
Glucose-Capillary: 203 mg/dL — ABNORMAL HIGH (ref 70–99)

## 2023-02-04 LAB — PROTIME-INR
INR: 2.4 — ABNORMAL HIGH (ref 0.8–1.2)
Prothrombin Time: 26.4 s — ABNORMAL HIGH (ref 11.4–15.2)

## 2023-02-04 LAB — MAGNESIUM: Magnesium: 2.3 mg/dL (ref 1.7–2.4)

## 2023-02-04 LAB — AMIODARONE LEVEL
Amiodarone Lvl: 637 ng/mL — ABNORMAL LOW (ref 1000–2500)
N-Desethyl-Amiodarone: 556 ng/mL

## 2023-02-04 LAB — HEPARIN LEVEL (UNFRACTIONATED)
Heparin Unfractionated: 0.29 [IU]/mL — ABNORMAL LOW (ref 0.30–0.70)
Heparin Unfractionated: 0.34 [IU]/mL (ref 0.30–0.70)

## 2023-02-04 LAB — POCT ACTIVATED CLOTTING TIME: Activated Clotting Time: 170 s

## 2023-02-04 SURGERY — ELECTROPHYSIOLOGY STUDY
Anesthesia: General

## 2023-02-04 MED ORDER — AMIODARONE HCL IN DEXTROSE 360-4.14 MG/200ML-% IV SOLN
60.0000 mg/h | INTRAVENOUS | Status: DC
  Administered 2023-02-04 (×2): 60 mg/h via INTRAVENOUS
  Filled 2023-02-04 (×2): qty 200

## 2023-02-04 MED ORDER — AMIODARONE LOAD VIA INFUSION
150.0000 mg | Freq: Once | INTRAVENOUS | Status: AC
  Administered 2023-02-04: 150 mg via INTRAVENOUS
  Filled 2023-02-04: qty 83.34

## 2023-02-04 MED ORDER — PROPOFOL 10 MG/ML IV BOLUS
30.0000 mg | Freq: Once | INTRAVENOUS | Status: AC
  Administered 2023-02-04: 30 mg via INTRAVENOUS

## 2023-02-04 MED ORDER — LACTATED RINGERS IV SOLN: INTRAVENOUS | Status: DC | PRN

## 2023-02-04 MED ORDER — SODIUM CHLORIDE 0.9% FLUSH: 3.0000 mL | INTRAVENOUS | Status: DC | PRN

## 2023-02-04 MED ORDER — SODIUM CHLORIDE 0.9 % IV SOLN: 250.0000 mL | INTRAVENOUS | Status: DC | PRN

## 2023-02-04 MED ORDER — MIDAZOLAM HCL 2 MG/2ML IJ SOLN
2.0000 mg | Freq: Once | INTRAMUSCULAR | Status: AC
  Administered 2023-02-04: 2 mg via INTRAVENOUS

## 2023-02-04 MED ORDER — CHLORHEXIDINE GLUCONATE CLOTH 2 % EX PADS
6.0000 | MEDICATED_PAD | Freq: Every day | CUTANEOUS | Status: DC
  Administered 2023-02-04 – 2023-02-08 (×5): 6 via TOPICAL

## 2023-02-04 MED ORDER — PHENYLEPHRINE HCL-NACL 20-0.9 MG/250ML-% IV SOLN
INTRAVENOUS | Status: DC | PRN
  Administered 2023-02-04: 20 ug/min via INTRAVENOUS

## 2023-02-04 MED ORDER — BUPIVACAINE HCL (PF) 0.25 % IJ SOLN
INTRAMUSCULAR | Status: AC
  Filled 2023-02-04: qty 60

## 2023-02-04 MED ORDER — DEXAMETHASONE SODIUM PHOSPHATE 10 MG/ML IJ SOLN
INTRAMUSCULAR | Status: DC | PRN
  Administered 2023-02-04: 5 mg via INTRAVENOUS

## 2023-02-04 MED ORDER — HEPARIN (PORCINE) IN NACL 1000-0.9 UT/500ML-% IV SOLN
INTRAVENOUS | Status: DC | PRN
  Administered 2023-02-04: 500 mL

## 2023-02-04 MED ORDER — FENTANYL CITRATE (PF) 100 MCG/2ML IJ SOLN
INTRAMUSCULAR | Status: AC
  Filled 2023-02-04: qty 2

## 2023-02-04 MED ORDER — ROCURONIUM BROMIDE 100 MG/10ML IV SOLN
INTRAVENOUS | Status: DC | PRN
  Administered 2023-02-04: 70 mg via INTRAVENOUS

## 2023-02-04 MED ORDER — FENTANYL CITRATE (PF) 100 MCG/2ML IJ SOLN
INTRAMUSCULAR | Status: DC | PRN
  Administered 2023-02-04: 100 ug via INTRAVENOUS

## 2023-02-04 MED ORDER — HEPARIN (PORCINE) 25000 UT/250ML-% IV SOLN
1550.0000 [IU]/h | INTRAVENOUS | Status: DC
  Administered 2023-02-04 – 2023-02-07 (×4): 1500 [IU]/h via INTRAVENOUS
  Administered 2023-02-08: 1550 [IU]/h via INTRAVENOUS
  Administered 2023-02-08: 1500 [IU]/h via INTRAVENOUS
  Administered 2023-02-09 – 2023-02-10 (×2): 1550 [IU]/h via INTRAVENOUS
  Filled 2023-02-04 (×8): qty 250

## 2023-02-04 MED ORDER — PHENYLEPHRINE 80 MCG/ML (10ML) SYRINGE FOR IV PUSH (FOR BLOOD PRESSURE SUPPORT)
PREFILLED_SYRINGE | INTRAVENOUS | Status: AC
  Filled 2023-02-04: qty 10

## 2023-02-04 MED ORDER — AMIODARONE HCL IN DEXTROSE 360-4.14 MG/200ML-% IV SOLN
60.0000 mg/h | INTRAVENOUS | Status: AC
Start: 2023-02-04 — End: 2023-02-07
  Administered 2023-02-04 – 2023-02-06 (×5): 60 mg/h via INTRAVENOUS
  Administered 2023-02-06 – 2023-02-07 (×2): 30 mg/h via INTRAVENOUS
  Administered 2023-02-07: 60 mg/h via INTRAVENOUS
  Filled 2023-02-04 (×9): qty 200

## 2023-02-04 MED ORDER — SODIUM CHLORIDE 0.9% FLUSH
3.0000 mL | Freq: Two times a day (BID) | INTRAVENOUS | Status: DC
  Administered 2023-02-04 – 2023-02-11 (×10): 3 mL via INTRAVENOUS

## 2023-02-04 MED ORDER — SUGAMMADEX SODIUM 200 MG/2ML IV SOLN
INTRAVENOUS | Status: DC | PRN
  Administered 2023-02-04: 400 mg via INTRAVENOUS

## 2023-02-04 MED ORDER — AMIODARONE HCL IN DEXTROSE 360-4.14 MG/200ML-% IV SOLN
INTRAVENOUS | Status: AC
  Filled 2023-02-04: qty 200

## 2023-02-04 MED ORDER — SUCCINYLCHOLINE CHLORIDE 200 MG/10ML IV SOSY
PREFILLED_SYRINGE | INTRAVENOUS | Status: DC | PRN
  Administered 2023-02-04: 120 mg via INTRAVENOUS

## 2023-02-04 MED ORDER — PROPOFOL 10 MG/ML IV BOLUS
INTRAVENOUS | Status: DC | PRN
  Administered 2023-02-04: 140 mg via INTRAVENOUS

## 2023-02-04 MED ORDER — AMIODARONE HCL IN DEXTROSE 360-4.14 MG/200ML-% IV SOLN: 30.0000 mg/h | INTRAVENOUS | Status: DC

## 2023-02-04 MED ORDER — ONDANSETRON HCL 4 MG/2ML IJ SOLN
INTRAMUSCULAR | Status: DC | PRN
  Administered 2023-02-04: 4 mg via INTRAVENOUS

## 2023-02-04 MED ORDER — FENTANYL CITRATE PF 50 MCG/ML IJ SOSY
100.0000 ug | PREFILLED_SYRINGE | Freq: Once | INTRAMUSCULAR | Status: AC
  Administered 2023-02-04: 100 ug via INTRAVENOUS

## 2023-02-04 MED ORDER — LORAZEPAM 2 MG/ML IJ SOLN
0.5000 mg | Freq: Once | INTRAMUSCULAR | Status: AC
  Administered 2023-02-04: 0.5 mg via INTRAVENOUS
  Filled 2023-02-04: qty 1

## 2023-02-04 MED ORDER — DEXAMETHASONE SODIUM PHOSPHATE 10 MG/ML IJ SOLN: INTRAMUSCULAR | Status: DC | PRN

## 2023-02-04 MED ORDER — ORAL CARE MOUTH RINSE: 15.0000 mL | OROMUCOSAL | Status: DC | PRN

## 2023-02-04 MED ORDER — BUPIVACAINE HCL (PF) 0.25 % IJ SOLN
INTRAMUSCULAR | Status: AC
  Filled 2023-02-04: qty 30

## 2023-02-04 MED ORDER — PHENYLEPHRINE 80 MCG/ML (10ML) SYRINGE FOR IV PUSH (FOR BLOOD PRESSURE SUPPORT)
PREFILLED_SYRINGE | INTRAVENOUS | Status: DC | PRN
  Administered 2023-02-04: 80 ug via INTRAVENOUS
  Administered 2023-02-04 (×2): 160 ug via INTRAVENOUS

## 2023-02-04 MED ORDER — FENTANYL CITRATE PF 50 MCG/ML IJ SOSY
PREFILLED_SYRINGE | INTRAMUSCULAR | Status: AC
  Filled 2023-02-04: qty 4

## 2023-02-04 MED ORDER — MIDAZOLAM HCL 2 MG/2ML IJ SOLN
INTRAMUSCULAR | Status: AC
  Filled 2023-02-04: qty 4

## 2023-02-04 MED ORDER — PROPOFOL 10 MG/ML IV BOLUS
INTRAVENOUS | Status: AC
  Filled 2023-02-04: qty 20

## 2023-02-04 SURGICAL SUPPLY — 10 items
BAG SNAP BAND KOVER 36X36 (MISCELLANEOUS) IMPLANT
CATH JOSEPH QUAD ALLRED 6F REP (CATHETERS) IMPLANT
CATH WEBSTER BI DIR CS D-F CRV (CATHETERS) IMPLANT
MAT PREVALON FULL STRYKER (MISCELLANEOUS) IMPLANT
PACK EP LF (CUSTOM PROCEDURE TRAY) ×1 IMPLANT
PAD DEFIB RADIO PHYSIO CONN (PAD) ×1 IMPLANT
PATCH CARTO3 (PAD) IMPLANT
SHEATH PINNACLE 6F 10CM (SHEATH) IMPLANT
SHEATH PINNACLE 7F 10CM (SHEATH) IMPLANT
SHEATH PROBE COVER 6X72 (BAG) IMPLANT

## 2023-02-04 NOTE — Progress Notes (Signed)
PHARMACY - ANTICOAGULATION CONSULT NOTE  Pharmacy Consult for heparin bridge from warfarin Indication: atrial fibrillation and mechanical MVR and mechanical AVR  Allergies  Allergen Reactions   Duloxetine Hcl Other (See Comments)    Behavioral changes, sleep disturbances   Iodinated Contrast Media Other (See Comments)    Patient is unsure of reaction type   Penicillins Other (See Comments)    Immune to drug , does not work per patient     Patient Measurements: Height: 5\' 5"  (165.1 cm) Weight: 93.2 kg (205 lb 7.5 oz) IBW/kg (Calculated) : 57 Heparin Dosing Weight: 78.3 kg  Vital Signs: Temp: 98.1 F (36.7 C) (12/17 0800) Temp Source: Oral (12/17 0800) BP: 113/55 (12/17 1000) Pulse Rate: 58 (12/17 1000)  Labs: Recent Labs    02/03/23 1545 02/03/23 1627 02/04/23 0014 02/04/23 0829  HGB 12.7  --  10.8*  --   HCT 38.2  --  33.1*  --   PLT 384  --  249  --   LABPROT  --  28.0*  --  26.4*  INR  --  2.6*  --  2.4*  HEPARINUNFRC  --   --  0.29* 0.34  CREATININE 1.41*  --  1.44*  --   TROPONINIHS 7 9  --   --     Estimated Creatinine Clearance: 46.3 mL/min (A) (by C-G formula based on SCr of 1.44 mg/dL (H)).   Medical History: Past Medical History:  Diagnosis Date   Allergy ?   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   Past Month   aspirin EC 81 MG tablet Take 81 mg by mouth daily.    02/03/2023   clonazePAM (KLONOPIN) 0.5 MG tablet Take 1.5 tablets (0.75 mg total) by mouth at bedtime. 45 tablet 0 02/02/2023   cyclobenzaprine (FLEXERIL) 10 MG tablet Take 1 tablet (10 mg total) by mouth at bedtime as needed for muscle spasms. 30 tablet 3 Taking As Needed   diclofenac Sodium (VOLTAREN ARTHRITIS PAIN) 1 % GEL Apply 4 g topically 4 (four) times daily. (Patient taking differently: Apply 4 g topically 4 (four) times daily as needed (pain).) 150 g 5 Taking Differently   diltiazem (CARDIZEM) 30 MG tablet Take 1 tablet every 4 hours AS NEEDED for AFIB heart rate >100 as long as top BP >100. 30 tablet 2 02/03/2023   empagliflozin (JARDIANCE) 10 MG TABS tablet Take 10 mg by mouth daily.   02/03/2023   furosemide (LASIX) 20 MG tablet Take 1 tablet (20 mg total) by mouth 2 (two) times daily. 60 tablet 3 02/03/2023   gabapentin (NEURONTIN) 100 MG capsule Take 1 capsule (100  mg total) by mouth at bedtime. 30 capsule 5 02/02/2023   HYDROcodone-acetaminophen (NORCO) 7.5-325 MG tablet Take 1 tablet by mouth every 12 (twelve) hours as needed for moderate pain (pain score 4-6). 60 tablet 0 02/02/2023   isosorbide mononitrate (IMDUR) 60 MG 24 hr tablet Take 1 tablet (60 mg total) by mouth daily. 90 tablet 3 02/03/2023   losartan (COZAAR) 50 MG tablet HOLD while on higher dose of metoprolol 12/11   Taking   metFORMIN (GLUCOPHAGE) 500 MG tablet Take 1 tablet (500 mg total) by mouth 2 (two) times daily with a meal. 180 tablet 3 02/03/2023   metoprolol succinate (TOPROL XL) 50 MG 24 hr tablet Take 1 tablet (50 mg total) by mouth 2 (two) times daily. 60 tablet 3 02/03/2023   montelukast (SINGULAIR) 10 MG tablet Take 1 tablet (10 mg total) by mouth daily. 30 tablet 11 02/02/2023    nitroGLYCERIN (NITROSTAT) 0.4 MG SL tablet DISSOLVE 1 TABLET UNDER THE TONGUE EVERY 5 MINUTES FOR 3 DOSES AS NEEDED FOR CHEST PAIN (Patient taking differently: Place 0.4 mg under the tongue every 5 (five) minutes as needed for chest pain.) 25 tablet 3 Taking Differently   ondansetron (ZOFRAN) 8 MG tablet Take 1 tablet (8 mg total) by mouth every 8 (eight) hours as needed for nausea or vomiting. 20 tablet 0 Taking As Needed   potassium chloride SA (KLOR-CON M) 20 MEQ tablet Take 1 tablet (20 mEq total) by mouth 2 (two) times daily. (Patient taking differently: Take 40 mEq by mouth daily.) 180 tablet 3 02/03/2023   rosuvastatin (CRESTOR) 40 MG tablet Take 1 tablet (40 mg total) by mouth daily. 90 tablet 1 02/02/2023   spironolactone (ALDACTONE) 50 MG tablet Take 1 tablet (50 mg total) by mouth daily. 90 tablet 3 02/03/2023   Tiotropium Bromide-Olodaterol (STIOLTO RESPIMAT) 2.5-2.5 MCG/ACT AERS Inhale 2 puffs into the lungs daily. 4 g 6 02/03/2023   warfarin (COUMADIN) 5 MG tablet Take 7.5 mg on Monday and Wed and 5 mg all other days (Patient taking differently: Take 5 mg by mouth See admin instructions. Take 5mg  (one tablet) once daily on Tuesdays, Thursdays, Saturdays, and Sundays. To be taken in addition to warfarin 7.5mg  once daily on Mondays, Wednesdays, and Fridays.) 90 tablet 3 02/02/2023 at  8:00 PM   Accu-Chek Softclix Lancets lancets Use as instructed (Patient taking differently: 1 each by Other route See admin instructions. Use as instructed) 100 each 12    Blood Glucose Monitoring Suppl (ACCU-CHEK GUIDE) w/Device KIT Check glucose every morning (Patient taking differently: 1 each by Other route See admin instructions. Check glucose every morning) 1 kit 0    dapagliflozin propanediol (FARXIGA) 5 MG TABS tablet Take 5 mg by mouth daily.      glucose blood (ACCU-CHEK GUIDE) test strip Use as instructed (Patient taking differently: 1 each by Other route as needed for other (Glucose chesk). Use as  instructed) 100 each 12    vortioxetine HBr (TRINTELLIX) 5 MG TABS tablet Take 1 tablet (5 mg total) by mouth at bedtime. (Patient not taking: Reported on 02/03/2023) 30 tablet 2 Not Taking    Assessment: 62 yo F presents with wide-complex tachycardia now in NSR s/p DCCV x1 shock. Patient has a history of rheumatic heart disease and is s/p mechanical MVR and mechanical AVR in 2018 and Afib anticoagulated with warfarin with goal INR of 2.5-3.5 PTA. Patient is being admitted for VT work-up per cardiology recs and pharmacy has been consulted to bridge heparin while  off of warfarin.   -heparin level= 0.34, INR= 2.4  PTA warfarin dose: 5mg /d except take 7.5mg  on MWF. Last clinic visit 12/10 w/ INR 3.2    Goal of Therapy:  Heparin level 0.3-0.7 units/ml INR 2.5-3.5 Monitor platelets by anticoagulation protocol: Yes   Plan:  -Warfarin on hold for possible procedures -Continue heparin at 1500 units/hr  -Daily heparin level and CBC  Harland German, PharmD Clinical Pharmacist **Pharmacist phone directory can now be found on [email or web address] ([email or web address]).  Listed under [facility].

## 2023-02-04 NOTE — TOC Initial Note (Signed)
Transition of Care [facility]) - Initial/Assessment Note    Patient Details  Name: Kimberly Hobbs MRN: [id number] Date of Birth: 1961-02-08  [facility] CM/SW Contact:    Elliot Cousin, RN Phone Number: [phone] 02/04/2023, 8:50 AM  Clinical Narrative:                 CM spoke to pt and SO at bedside. States she is independent pta. States SO assist with care as needed. She has CPAP, RW, cane and bedside commode at bedside.  Drives to appts.  States she will need transportation to [employer] [facility] [employer] as SO is having surgery.  Will continue to follow for dc needs.   Expected Discharge Plan: Home w Home Health Services Barriers to Discharge: Continued Medical Work up   Patient Goals and CMS Choice Patient states their goals for this hospitalization and ongoing recovery are:: wants to recover          Expected Discharge Plan and Services   Discharge Planning Services: CM Consult   Living arrangements for the past 2 months: Single Family Home                                      Prior Living Arrangements/Services Living arrangements for the past 2 months: Single Family Home Lives with:: Significant Other Patient language and need for interpreter reviewed:: Yes Do you feel safe going back to the place where you live?: Yes      Need for Family Participation in Patient Care: No (Comment) Care giver support system in place?: Yes (comment) Current home services: DME (rolling walker, cane, bedside cane, CPAP) Criminal Activity/Legal Involvement Pertinent to Current Situation/Hospitalization: No - Comment as needed  Activities of Daily Living   ADL Screening (condition at time of admission) Independently performs ADLs?: Yes (appropriate for developmental age) Is the patient deaf or have difficulty hearing?: No Does the patient have difficulty seeing, even when wearing glasses/contacts?: No Does the patient have difficulty concentrating, remembering,  or making decisions?: No  Permission Sought/Granted Permission sought to share information with : Case Manager, Family Supports, PCP Permission granted to share information with : Yes, Verbal Permission Granted  Share Information with NAME: Mosetta Pigeon     Permission granted to share info w Relationship: SO  Permission granted to share info w Contact Information: [phone]  Emotional Assessment Appearance:: Appears stated age Attitude/Demeanor/Rapport: Engaged Affect (typically observed): Accepting Orientation: : Oriented to Self, Oriented to Place, Oriented to  Time, Oriented to Situation   Psych Involvement: No (comment)  Admission diagnosis:  VT (ventricular tachycardia) (HCC) [I47.20] Patient Active Problem List   Diagnosis Date Noted   Ventricular tachycardia (HCC) 02/03/2023   VT (ventricular tachycardia) (HCC) 02/03/2023   Psychophysiological insomnia 01/23/2023   Hypercoagulable state due to persistent atrial fibrillation (HCC) 10/03/2022   Restless leg 10/01/2022   OSA (obstructive sleep apnea) 09/27/2022   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/2022   Iron deficiency anemia 07/31/2022   DDD (degenerative disc disease), lumbosacral 03/26/2022   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   Chronic right-sided low back pain without sciatica 02/26/2022   Cervical strain 12/20/2021   Sensorineural hearing loss (SNHL) of both ears 10/24/2021   Hemorrhoids 05/18/2021   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   Paroxysmal atrial fibrillation (HCC) 03/28/2021   GAD (generalized anxiety disorder) 02/01/2021  DM (diabetes mellitus) (HCC) 02/01/2021   COPD mixed type (HCC) 07/07/2019   Pulmonary hypertension, unspecified (HCC) 07/21/2018   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/05/2018   Long term (current) use of anticoagulants 01/08/2017   H/O mitral valve replacement with mechanical #25  mechanical SJM 01/01/2017) 01/01/2017   PVCs (premature ventricular contractions) 11/17/2015   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    Essential hypertension 06/29/2015   PCP:  [name], PA Pharmacy:   [facility] [phone] - [place], [place] - [address] AT [address] [place]  [address] Phone: [phone] Fax: [phone]     Social Drivers of Health (SDOH) Social History: SDOH Screenings   Food Insecurity: No Food Insecurity (02/03/2023)  Housing: Low Risk  (02/03/2023)  Transportation Needs: No Transportation Needs (02/03/2023)  Utilities: Not At Risk (02/03/2023)  Alcohol Screen: Low Risk  (10/10/2021)  Depression (PHQ2-9): Low Risk  (01/07/2023)  Recent Concern: Depression (PHQ2-9) - Medium Risk (12/23/2022)  Financial Resource Strain: Low Risk  (10/14/2022)  Physical Activity: Inactive (10/14/2022)  Social Connections: Moderately Isolated (10/14/2022)  Stress: Stress Concern Present (10/14/2022)  Tobacco Use: Medium Risk (02/03/2023)  Health Literacy: Adequate Health Literacy (10/14/2022)   SDOH Interventions:     Readmission Risk Interventions     No data to display

## 2023-02-04 NOTE — Anesthesia Postprocedure Evaluation (Signed)
Anesthesia Post Note  Patient: Kimberly Hobbs  Procedure(s) Performed: ELECTROPHYSIOLOGY STUDY     Patient location during evaluation: Cath Lab Anesthesia Type: General Level of consciousness: sedated and patient cooperative Pain management: pain level controlled Vital Signs Assessment: post-procedure vital signs reviewed and stable Respiratory status: spontaneous breathing Cardiovascular status: stable Anesthetic complications: no   There were no known notable events for this encounter.  Last Vitals:  Vitals:   02/04/23 1800 02/04/23 1805  BP: (!) 116/57 (!) 126/54  Pulse: (!) 57 (!) 59  Resp: 15 20  Temp:    SpO2: 95% 93%    Last Pain:  Vitals:   02/04/23 1650  TempSrc:   PainSc: 0-No pain                 Lewie Loron

## 2023-02-04 NOTE — Progress Notes (Signed)
PHARMACY - ANTICOAGULATION CONSULT NOTE  Pharmacy Consult for heparin bridge while warfarin on hold Indication: atrial fibrillation and mechanical MVR and mechanical AVR  Allergies  Allergen Reactions   Duloxetine Hcl Other (See Comments)    Behavioral changes, sleep disturbances   Iodinated Contrast Media Other (See Comments)    Patient is unsure of reaction type   Penicillins Other (See Comments)    Immune to drug , does not work per patient     Patient Measurements: Height: 5\' 5"  (165.1 cm) Weight: 93.2 kg (205 lb 7.5 oz) IBW/kg (Calculated) : 57 Heparin Dosing Weight: 78.3 kg  Vital Signs: Temp: 98.8 F (37.1 C) (12/17 1635) Temp Source: Temporal (12/17 1635) BP: 126/54 (12/17 1805) Pulse Rate: 59 (12/17 1805)  Labs: Recent Labs    02/03/23 1545 02/03/23 1627 02/04/23 0014 02/04/23 0829  HGB 12.7  --  10.8*  --   HCT 38.2  --  33.1*  --   PLT 384  --  249  --   LABPROT  --  28.0*  --  26.4*  INR  --  2.6*  --  2.4*  HEPARINUNFRC  --   --  0.29* 0.34  CREATININE 1.41*  --  1.44*  --   TROPONINIHS 7 9  --   --     Estimated Creatinine Clearance: 46.3 mL/min (A) (by C-G formula based on SCr of 1.44 mg/dL (H)).   Medical History: Past Medical History:  Diagnosis Date   Allergy ?   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   Past Month   aspirin EC 81 MG tablet Take 81 mg by mouth daily.    02/03/2023   clonazePAM (KLONOPIN) 0.5 MG tablet Take 1.5 tablets (0.75 mg total) by mouth at bedtime. 45 tablet 0 02/02/2023   cyclobenzaprine (FLEXERIL) 10 MG tablet Take 1 tablet (10 mg total) by mouth at bedtime as needed for muscle spasms. 30 tablet 3 Taking As Needed   diclofenac Sodium (VOLTAREN ARTHRITIS PAIN) 1 % GEL Apply 4 g topically 4 (four) times daily. (Patient taking differently: Apply 4 g topically 4 (four) times daily as needed (pain).) 150 g 5 Taking Differently   diltiazem (CARDIZEM) 30 MG tablet Take 1 tablet every 4 hours AS NEEDED for AFIB heart rate >100 as long as top BP >100. 30 tablet 2 02/03/2023   empagliflozin (JARDIANCE) 10 MG TABS tablet Take 10 mg by mouth daily.   02/03/2023   furosemide (LASIX) 20 MG tablet Take 1 tablet (20 mg total) by mouth 2 (two) times daily. 60 tablet 3 02/03/2023   gabapentin (NEURONTIN) 100 MG capsule Take 1  capsule (100 mg total) by mouth at bedtime. 30 capsule 5 02/02/2023   HYDROcodone-acetaminophen (NORCO) 7.5-325 MG tablet Take 1 tablet by mouth every 12 (twelve) hours as needed for moderate pain (pain score 4-6). 60 tablet 0 02/02/2023   isosorbide mononitrate (IMDUR) 60 MG 24 hr tablet Take 1 tablet (60 mg total) by mouth daily. 90 tablet 3 02/03/2023   losartan (COZAAR) 50 MG tablet HOLD while on higher dose of metoprolol 12/11   Taking   metFORMIN (GLUCOPHAGE) 500 MG tablet Take 1 tablet (500 mg total) by mouth 2 (two) times daily with a meal. 180 tablet 3 02/03/2023   metoprolol succinate (TOPROL XL) 50 MG 24 hr tablet Take 1 tablet (50 mg total) by mouth 2 (two) times daily. 60 tablet 3 02/03/2023   montelukast (SINGULAIR) 10 MG tablet Take 1 tablet (10 mg total) by mouth daily. 30 tablet 11  02/02/2023   nitroGLYCERIN (NITROSTAT) 0.4 MG SL tablet DISSOLVE 1 TABLET UNDER THE TONGUE EVERY 5 MINUTES FOR 3 DOSES AS NEEDED FOR CHEST PAIN (Patient taking differently: Place 0.4 mg under the tongue every 5 (five) minutes as needed for chest pain.) 25 tablet 3 Taking Differently   ondansetron (ZOFRAN) 8 MG tablet Take 1 tablet (8 mg total) by mouth every 8 (eight) hours as needed for nausea or vomiting. 20 tablet 0 Taking As Needed   potassium chloride SA (KLOR-CON M) 20 MEQ tablet Take 1 tablet (20 mEq total) by mouth 2 (two) times daily. (Patient taking differently: Take 40 mEq by mouth daily.) 180 tablet 3 02/03/2023   rosuvastatin (CRESTOR) 40 MG tablet Take 1 tablet (40 mg total) by mouth daily. 90 tablet 1 02/02/2023   spironolactone (ALDACTONE) 50 MG tablet Take 1 tablet (50 mg total) by mouth daily. 90 tablet 3 02/03/2023   Tiotropium Bromide-Olodaterol (STIOLTO RESPIMAT) 2.5-2.5 MCG/ACT AERS Inhale 2 puffs into the lungs daily. 4 g 6 02/03/2023   warfarin (COUMADIN) 5 MG tablet Take 7.5 mg on Monday and Wed and 5 mg all other days (Patient taking differently: Take 5 mg by mouth See admin instructions. Take 5mg  (one tablet) once daily on Tuesdays, Thursdays, Saturdays, and Sundays. To be taken in addition to warfarin 7.5mg  once daily on Mondays, Wednesdays, and Fridays.) 90 tablet 3 02/02/2023 at  8:00 PM   Accu-Chek Softclix Lancets lancets Use as instructed (Patient taking differently: 1 each by Other route See admin instructions. Use as instructed) 100 each 12    Blood Glucose Monitoring Suppl (ACCU-CHEK GUIDE) w/Device KIT Check glucose every morning (Patient taking differently: 1 each by Other route See admin instructions. Check glucose every morning) 1 kit 0    dapagliflozin propanediol (FARXIGA) 5 MG TABS tablet Take 5 mg by mouth daily.      glucose blood (ACCU-CHEK GUIDE) test strip Use as instructed (Patient taking differently: 1 each by Other route as needed for other (Glucose  chesk). Use as instructed) 100 each 12    vortioxetine HBr (TRINTELLIX) 5 MG TABS tablet Take 1 tablet (5 mg total) by mouth at bedtime. (Patient not taking: Reported on 02/03/2023) 30 tablet 2 Not Taking    Assessment: 62 yo F presents with wide-complex tachycardia now in NSR s/p DCCV x1 shock. Patient has a history of rheumatic heart disease and is s/p mechanical MVR and mechanical AVR in 2018 and Afib anticoagulated with warfarin with goal INR of 2.5-3.5 PTA. Patient is being admitted for VT work-up per cardiology recs and pharmacy has been consulted to bridge  heparin while off of warfarin.   -heparin level= 0.34, INR= 2.4  PTA warfarin dose: 5mg /d except take 7.5mg  on MWF. Last clinic visit 12/10 w/ INR 3.2    PM Update: Heparin was stopped for EP study this afternoon. 2 venous sheaths were placed for study and removed at 1723PM. Discussed resuming Heparin with Dr. Elberta Fortis, EP - ok to resume at midnight.   Goal of Therapy:  Heparin level 0.3-0.7 units/ml INR 2.5-3.5 Monitor platelets by anticoagulation protocol: Yes   Plan:  -Warfarin on hold for possible procedures -Restart Heparin at prior rate of 1500 units/hr at midnight.  -F/up heparin level in 6 hours after restart  -Daily heparin level and CBC  Link Snuffer, PharmD, BCPS, BCCCP Please refer to [facility] for [facility] numbers 02/04/2023, 6:32 PM

## 2023-02-04 NOTE — Progress Notes (Signed)
Called to bedside for VT.  Patient felt a "fluttering, increase in her HR from 70 to 80, then in  the 160's".  She was aware of rhythm change, became hot/flushed with episode. No change in LOC / hemodynamics.  Plan of care discussed with Dr. Elberta Fortis.    Dr. Melburn Popper called for assistance for DCCV at bedside.  Dr. Merrily Pew assisted with sedation for procedure.  Patient shocked x2 with anterior manual pressure at 200j > converted to AF with ventricular rates in 100-110's.  Pt remains on heparin gtt in setting of mechanical mitral valve.    Daughter, Wynona Canes, called and plan of care discussed with daughter, patient and boyfriend at bedside. Pt was post sedation and unable to consent but participated in discussion.  Reviewed concept of EPS, risks benefits discussed and patient / family willing to proceed.     Canary Brim, MSN, APRN, NP-C, AGACNP-BC [facility] - Electrophysiology  02/04/2023, 2:31 PM

## 2023-02-04 NOTE — Procedures (Signed)
Moderate Conscious Sedation Note    Kimberly Hobbs  [id number]  [birth year]   Date:02/04/23  Time:5:57 PM   Provider Performing:Jaaziel Peatross   Procedure: Moderate Conscious Sedation  During this procedure the patient is administered a total of Versed 4 mg, propofol 60 mg in divided doses and Fentanyl 200 mg to achieve and maintain moderate conscious sedation. The patient's heart rate, blood pressure, and oxygen saturation are monitored continuously during the procedure. The period of conscious sedation is 20 minutes, of which I was present face-to-face 100% of this time.

## 2023-02-04 NOTE — Progress Notes (Signed)
Notified nurse that patient went into A-fib RVR with heart rates in the 140s to 150s.  Vital signs stable.  Patient fairly asymptomatic, but having some nausea.  Discussed with Dr. Wyline Mood and will bolus again with 150 mg of IV amiodarone.

## 2023-02-04 NOTE — Plan of Care (Signed)
  Problem: Activity: Goal: Risk for activity intolerance will decrease Outcome: Progressing   Problem: Coping: Goal: Level of anxiety will decrease Outcome: Progressing   Problem: Elimination: Goal: Will not experience complications related to urinary retention Outcome: Progressing   Problem: Safety: Goal: Ability to remain free from injury will improve Outcome: Progressing   

## 2023-02-04 NOTE — Progress Notes (Addendum)
Site area: Right Groin venous x 2 Site Prior to Removal:  Level 0  Pressure Applied For: 20 min Manual: yes   Patient Status During Pull: Stable   Post Pull Site:  Level 0 Post Pull Instructions Given: Yes  Post Pull Pulses Present: Yes, R & L DP Dressing Applied: gauze & tegaderm  Bedrest begins @ 1705 Comments: While holding pressure, pt heartrate increased to 150s at approx 1655.  Pt already had orders for amiodarone drip.  Pt remained Aox4, reported feeling their heartrate increase, denied any SOB or chest pain, no other discomfort noted. BP stable. Dr Elberta Fortis notified via cath lab 9 tech Diona Fanti [facility] scrubbed in at time of event).

## 2023-02-04 NOTE — Anesthesia Procedure Notes (Signed)
Procedure Name: Intubation Date/Time: 02/04/2023 3:00 PM  Performed by: Orlin Hilding, CRNAPre-anesthesia Checklist: Patient identified, Emergency Drugs available, Suction available, Patient being monitored and Timeout performed Patient Re-evaluated:Patient Re-evaluated prior to induction Oxygen Delivery Method: Circle system utilized Preoxygenation: Pre-oxygenation with 100% oxygen Induction Type: IV induction Ventilation: Mask ventilation without difficulty and Oral airway inserted - appropriate to patient size Laryngoscope Size: Mac and 3 Grade View: Grade I Tube type: Oral Rae Tube size: 7.0 mm Number of attempts: 1 Placement Confirmation: ETT inserted through vocal cords under direct vision, positive ETCO2 and breath sounds checked- equal and bilateral Secured at: 22 cm Tube secured with: Tape Dental Injury: Teeth and Oropharynx as per pre-operative assessment

## 2023-02-04 NOTE — Progress Notes (Addendum)
Patient Name: Kimberly Hobbs Date of Encounter: 02/04/2023  Primary Cardiologist: Thomasene Ripple, DO Electrophysiologist: Kena Limon Jorja Loa, MD  Interval Summary   The patient is doing well today.  At this time, the patient denies chest pain, shortness of breath, or any new concerns.  Vital Signs    Vitals:   02/04/23 0435 02/04/23 0500 02/04/23 0516 02/04/23 0600  BP:  (!) 110/47  (!) 108/45  Pulse: 60 (!) 57 (!) 56 (!) 57  Resp:      Temp:      TempSrc:      SpO2: 94% 94% 95% 92%  Weight:    93.2 kg  Height:        Intake/Output Summary (Last 24 hours) at 02/04/2023 0731 Last data filed at 02/04/2023 0600 Gross per 24 hour  Intake 140.99 ml  Output 277 ml  Net -136.01 ml   Filed Weights   02/03/23 1530 02/04/23 0600  Weight: 94.6 kg 93.2 kg    Physical Exam    GEN- The patient is well appearing, alert and oriented x 3 today.   Lungs- Clear to ausculation bilaterally, normal work of breathing Cardiac- Regular rate and rhythm, no murmurs, rubs or gallops GI- soft, NT, ND, + BS Extremities- no clubbing or cyanosis. No edema  Telemetry    SB 58 bpm - SR 70's (personally reviewed)  Hospital Course    Amal Clowes is a 62 y.o. female with PMH  AF, CAD s/p CABG rheumatic heart disease s/p mechanical MVR / AVR, CKD, DM, HLD admitted for WCT.  S/p DCCV in ER. Troponin, BNP negative.   Assessment & Plan    Wide Complex Tachycardia  No underlying flutter waves seen with adenosine 12mg . S/p DCCV x1 at 200j with anterior manual pressure.  NSR restored. Query if presenting rhythm VT vs AFL  -assess cMRI to r/o scar mediated VT -plan for EP Study, timing to be determined  -troponin negative   AF/AFL Hx AF ablation 09/05/22, CHA2DS2-VASc 5 -recurrent AFL, metoprolol increased 01/20/23 as outpt -OAC    Secondary Hypercoagulable State  -warfarin per pharmacy    CAD  Rheumatic Valve Disease s/p Mechanical MVR/AVR 2018 -coumadin with mechanical MVR, INR  2.6  -Keithen Capo discuss LHC with Cardiology    DM  -SSI  -hold home metformin   OSA  -CPAP at bedtime   For questions or updates, please contact [facility] Please consult [email or web address] for contact info under Cardiology/STEMI.  Signed, Canary Brim, MSN, APRN, NP-C, AGACNP-BC [facility] - Electrophysiology  02/04/2023, 9:00 AM  I have seen and examined this patient with Canary Brim.  Agree with above, note added to reflect my findings.  No further VT overnight.  Currently feeling well.  No acute complaints.  GEN: Well nourished, well developed, in no acute distress  HEENT: normal  Neck: no JVD, carotid bruits, or masses Cardiac: RRR; no murmurs, rubs, or gallops,no edema  Respiratory:  clear to auscultation bilaterally, normal work of breathing GI: soft, nontender, nondistended, + BS MS: no deformity or atrophy  Skin: warm and dry Neuro:  Strength and sensation are intact Psych: euthymic mood, full affect   Wide-complex tachycardia: Received adenosine yesterday without change.  Required cardioversion in the emergency room.  Tien Spooner plan for cardiac MRI today to assess scar and possible VT.  If nothing is seen on this, Vicenta Olds likely plan for EP study. Persistent atrial fibrillation/flutter: Post ablation x 2.  In sinus rhythm. Coronary artery disease: Troponin negative.  No plans for left heart catheterization Post MVR/AVR: Stable on most recent echo Diabetes continue home meds Sleep apnea: CPAP compliance  Mikhail Hallenbeck M. Haedyn Ancrum MD 02/04/2023 9:31 AM

## 2023-02-04 NOTE — Progress Notes (Signed)
PHARMACY - ANTICOAGULATION CONSULT NOTE  Pharmacy Consult for heparin bridge from warfarin Indication: atrial fibrillation and mechanical MVR and mechanical AVR  Allergies  Allergen Reactions   Duloxetine Hcl Other (See Comments)    Behavioral changes, sleep disturbances   Iodinated Contrast Media Other (See Comments)    Patient is unsure of reaction type   Penicillins Other (See Comments)    Immune to drug , does not work per patient     Patient Measurements: Height: 5\' 5"  (165.1 cm) Weight: 94.6 kg (208 lb 9.6 oz) IBW/kg (Calculated) : 57 Heparin Dosing Weight: 78.3 kg  Vital Signs: Temp: 98.2 F (36.8 C) (12/17 0000) Temp Source: Oral (12/17 0000) BP: 109/46 (12/17 0045) Pulse Rate: 59 (12/17 0000)  Labs: Recent Labs    02/03/23 1545 02/03/23 1627 02/04/23 0014  HGB 12.7  --  10.8*  HCT 38.2  --  33.1*  PLT 384  --  249  LABPROT  --  28.0*  --   INR  --  2.6*  --   HEPARINUNFRC  --   --  0.29*  CREATININE 1.41*  --  1.44*  TROPONINIHS 7 9  --     Estimated Creatinine Clearance: 46.6 mL/min (A) (by C-G formula based on SCr of 1.44 mg/dL (H)).   Medical History: Past Medical History:  Diagnosis Date   Allergy ?   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 01/01/2017 01/01/2017    Medications:  Medications Prior to Admission  Medication Sig Dispense Refill Last Dose/Taking   albuterol (VENTOLIN HFA) 108 (90 Base) MCG/ACT inhaler Inhale  1-2 puffs into the lungs every 6 (six) hours as needed for wheezing or shortness of breath.   Past Month   aspirin EC 81 MG tablet Take 81 mg by mouth daily.    02/03/2023   clonazePAM (KLONOPIN) 0.5 MG tablet Take 1.5 tablets (0.75 mg total) by mouth at bedtime. 45 tablet 0 02/02/2023   cyclobenzaprine (FLEXERIL) 10 MG tablet Take 1 tablet (10 mg total) by mouth at bedtime as needed for muscle spasms. 30 tablet 3 Taking As Needed   diclofenac Sodium (VOLTAREN ARTHRITIS PAIN) 1 % GEL Apply 4 g topically 4 (four) times daily. (Patient taking differently: Apply 4 g topically 4 (four) times daily as needed (pain).) 150 g 5 Taking Differently   diltiazem (CARDIZEM) 30 MG tablet Take 1 tablet every 4 hours AS NEEDED for AFIB heart rate >100 as long as top BP >100. 30 tablet 2 02/03/2023   empagliflozin (JARDIANCE) 10 MG TABS tablet Take 10 mg by mouth daily.   02/03/2023   furosemide (LASIX) 20 MG tablet Take 1 tablet (20 mg total) by mouth 2 (two) times daily. 60 tablet 3 02/03/2023   gabapentin (NEURONTIN) 100 MG capsule Take 1 capsule (100 mg total) by mouth at bedtime. 30 capsule 5 02/02/2023   HYDROcodone-acetaminophen (NORCO) 7.5-325 MG tablet Take 1 tablet  by mouth every 12 (twelve) hours as needed for moderate pain (pain score 4-6). 60 tablet 0 02/02/2023   isosorbide mononitrate (IMDUR) 60 MG 24 hr tablet Take 1 tablet (60 mg total) by mouth daily. 90 tablet 3 02/03/2023   losartan (COZAAR) 50 MG tablet HOLD while on higher dose of metoprolol 12/11   Taking   metFORMIN (GLUCOPHAGE) 500 MG tablet Take 1 tablet (500 mg total) by mouth 2 (two) times daily with a meal. 180 tablet 3 02/03/2023   metoprolol succinate (TOPROL XL) 50 MG 24 hr tablet Take 1 tablet (50 mg total) by mouth 2 (two) times daily. 60 tablet 3 02/03/2023   montelukast (SINGULAIR) 10 MG tablet Take 1 tablet (10 mg total) by mouth daily. 30 tablet 11 02/02/2023   nitroGLYCERIN (NITROSTAT) 0.4 MG SL tablet DISSOLVE 1 TABLET UNDER THE  TONGUE EVERY 5 MINUTES FOR 3 DOSES AS NEEDED FOR CHEST PAIN (Patient taking differently: Place 0.4 mg under the tongue every 5 (five) minutes as needed for chest pain.) 25 tablet 3 Taking Differently   ondansetron (ZOFRAN) 8 MG tablet Take 1 tablet (8 mg total) by mouth every 8 (eight) hours as needed for nausea or vomiting. 20 tablet 0 Taking As Needed   potassium chloride SA (KLOR-CON M) 20 MEQ tablet Take 1 tablet (20 mEq total) by mouth 2 (two) times daily. (Patient taking differently: Take 40 mEq by mouth daily.) 180 tablet 3 02/03/2023   rosuvastatin (CRESTOR) 40 MG tablet Take 1 tablet (40 mg total) by mouth daily. 90 tablet 1 02/02/2023   spironolactone (ALDACTONE) 50 MG tablet Take 1 tablet (50 mg total) by mouth daily. 90 tablet 3 02/03/2023   Tiotropium Bromide-Olodaterol (STIOLTO RESPIMAT) 2.5-2.5 MCG/ACT AERS Inhale 2 puffs into the lungs daily. 4 g 6 02/03/2023   warfarin (COUMADIN) 5 MG tablet Take 7.5 mg on Monday and Wed and 5 mg all other days (Patient taking differently: Take 5 mg by mouth See admin instructions. Take 5mg  (one tablet) once daily on Tuesdays, Thursdays, Saturdays, and Sundays. To be taken in addition to warfarin 7.5mg  once daily on Mondays, Wednesdays, and Fridays.) 90 tablet 3 02/02/2023 at  8:00 PM   warfarin (COUMADIN) 7.5 MG tablet Take 7.5 mg on Monday and Wednesday and 5 mg all other days (Patient taking differently: Take 7.5 mg by mouth See admin instructions. Take 7.5mg  (one tablet) once daily on Mondays, Wednesdays, and Fridays. To be taken in addition to warfarin 5mg  once daily on Tuesdays, Thursdays, Saturdays, and Sundays.) 90 tablet 3 01/31/2023   Accu-Chek Softclix Lancets lancets Use as instructed (Patient taking differently: 1 each by Other route See admin instructions. Use as instructed) 100 each 12    Blood Glucose Monitoring Suppl (ACCU-CHEK GUIDE) w/Device KIT Check glucose every morning (Patient taking differently: 1 each by Other route See admin  instructions. Check glucose every morning) 1 kit 0    dapagliflozin propanediol (FARXIGA) 5 MG TABS tablet Take 5 mg by mouth daily.      glucose blood (ACCU-CHEK GUIDE) test strip Use as instructed (Patient taking differently: 1 each by Other route as needed for other (Glucose chesk). Use as instructed) 100 each 12    vortioxetine HBr (TRINTELLIX) 5 MG TABS tablet Take 1 tablet (5 mg total) by mouth at bedtime. (Patient not taking: Reported on 02/03/2023) 30 tablet 2 Not Taking    Assessment: 62 yo F presents with wide-complex tachycardia now in NSR s/p DCCV x1 shock. Patient has a history of rheumatic  heart disease and is s/p mechanical MVR and mechanical AVR in 2018 and Afib anticoagulated with warfarin with goal INR of 2.5-3.5 PTA. Pt was last seen in anticoag clinic on 01/28/23 with INR of 3.2 and instructed to take warfarin 5mg  TRSaSu and 7.5mg  on MWF. Patient is being admitted for VT work-up per cardiology recs and pharmacy has been consulted to bridge heparin while off of warfarin.   INR 2.6 Hgb 12.7, Plt 384 No s/sx of bleeding  12/17 AM update:  Heparin level sub-therapeutic   Goal of Therapy:  Heparin level 0.3-0.7 units/ml INR 2.5-3.5 Monitor platelets by anticoagulation protocol: Yes   Plan:  Inc heparin to 1500 units/hr 8 hour heparin level Daily CBC, heparin level, INR  Abran Duke, PharmD, BCPS Clinical Pharmacist Phone: [phone]

## 2023-02-04 NOTE — Transfer of Care (Signed)
Immediate Anesthesia Transfer of Care Note  Patient: Kimberly Hobbs  Procedure(s) Performed: ELECTROPHYSIOLOGY STUDY  Patient Location: PACU and Cath Lab  Anesthesia Type:General  Level of Consciousness: awake, oriented, and patient cooperative  Airway & Oxygen Therapy: Patient Spontanous Breathing and Patient connected to nasal cannula oxygen  Post-op Assessment: Report given to RN  Post vital signs: Reviewed  Last Vitals:  Vitals Value Taken Time  BP    Temp    Pulse    Resp    SpO2      Last Pain:  Vitals:   02/04/23 1200  TempSrc:   PainSc: 0-No pain      Patients Stated Pain Goal: 0 (02/03/23 2000)  Complications: There were no known notable events for this encounter.

## 2023-02-04 NOTE — Anesthesia Preprocedure Evaluation (Addendum)
Anesthesia Evaluation  Patient identified by MRN, date of birth, ID band Patient awake    Reviewed: Allergy & Precautions, NPO status , Patient's Chart, lab work & pertinent test results  Airway Mallampati: III  TM Distance: >3 FB Neck ROM: Full    Dental  (+) Edentulous Upper, Edentulous Lower   Pulmonary sleep apnea and Continuous Positive Airway Pressure Ventilation , COPD,  COPD inhaler, former smoker   Pulmonary exam normal breath sounds clear to auscultation       Cardiovascular hypertension, Pt. on medications and Pt. on home beta blockers + angina  + CAD, + CABG and +CHF  + dysrhythmias Atrial Fibrillation + Valvular Problems/Murmurs  Rhythm:Regular Rate:Normal  Echocardiogram 04/05/2022: Mildly depressed LV systolic function with EF 53%. Left ventricle cavity is normal in size. Normal global wall motion. Calculated EF 53%. Mechanical trileaflet aortic valve.  No aortic valve regurgitation. AVA (VTI) measures 1.4 cm^2. AV Mean Grad measures 20.6 mmHg. AV Pk Vel measures 3.39 m/s. Mechanical mitral valve.  No mitral valve regurgitation. E-wave dominant mitral inflow. Structurally normal tricuspid valve.  Mild tricuspid regurgitation. RVSP measures 35 mmHg. IVC is dilated with respiratory variation. Normally functioning prosthetic valves.   CT coronary Ca++ 08/2022 1. There is normal pulmonary vein drainage into the left atrium with ostial measurements above. 2. There is no thrombus in the left atrial appendage. 3. The esophagus runs in the left atrial midline and is not in proximity to any of the pulmonary vein ostia.  4. No PFO/ASD.  5. Normal coronary origin. Right dominance.  6. CAC score of 922 which is 99 percentile for age-, race-, and sex-matched controls.    Neuro/Psych  PSYCHIATRIC DISORDERS Anxiety      Neuromuscular disease    GI/Hepatic Neg liver ROS,GERD  ,,  Endo/Other  diabetes, Oral Hypoglycemic Agents     Renal/GU Renal disease     Musculoskeletal  (+) Arthritis ,    Abdominal  (+) + obese  Peds  Hematology  (+) Blood dyscrasia (Warfarin), anemia INR:2.7   Anesthesia Other Findings A-fib  Reproductive/Obstetrics                             Anesthesia Physical Anesthesia Plan  ASA: 4 and emergent  Anesthesia Plan: General   Post-op Pain Management: Minimal or no pain anticipated   Induction: Intravenous  PONV Risk Score and Plan: 3 and Ondansetron, Dexamethasone, Midazolam and Treatment may vary due to age or medical condition  Airway Management Planned: Oral ETT  Additional Equipment:   Intra-op Plan:   Post-operative Plan: Extubation in OR  Informed Consent: I have reviewed the patients History and Physical, chart, labs and discussed the procedure including the risks, benefits and alternatives for the proposed anesthesia with the patient or authorized representative who has indicated his/her understanding and acceptance.     Dental advisory given  Plan Discussed with: CRNA  Anesthesia Plan Comments:        Anesthesia Quick Evaluation

## 2023-02-05 ENCOUNTER — Inpatient Hospital Stay (HOSPITAL_COMMUNITY): Payer: 59

## 2023-02-05 ENCOUNTER — Encounter (HOSPITAL_COMMUNITY): Payer: Self-pay | Admitting: Cardiology

## 2023-02-05 ENCOUNTER — Telehealth: Payer: Self-pay

## 2023-02-05 DIAGNOSIS — I472 Ventricular tachycardia, unspecified: Secondary | ICD-10-CM | POA: Diagnosis not present

## 2023-02-05 LAB — BASIC METABOLIC PANEL
Anion gap: 10 (ref 5–15)
BUN: 24 mg/dL — ABNORMAL HIGH (ref 8–23)
CO2: 19 mmol/L — ABNORMAL LOW (ref 22–32)
Calcium: 9 mg/dL (ref 8.9–10.3)
Chloride: 105 mmol/L (ref 98–111)
Creatinine, Ser: 1.3 mg/dL — ABNORMAL HIGH (ref 0.44–1.00)
GFR, Estimated: 47 mL/min — ABNORMAL LOW (ref >60)
Glucose, Bld: 208 mg/dL — ABNORMAL HIGH (ref 70–99)
Potassium: 3.9 mmol/L (ref 3.5–5.1)
Sodium: 134 mmol/L — ABNORMAL LOW (ref 135–145)

## 2023-02-05 LAB — ECHOCARDIOGRAM COMPLETE
AR max vel: 1.26 cm2
AV Area VTI: 1.34 cm2
AV Area mean vel: 1.25 cm2
AV Mean grad: 19.5 mm[Hg]
AV Peak grad: 36.1 mm[Hg]
Ao pk vel: 3.01 m/s
Area-P 1/2: 2.4 cm2
Height: 65 in
MV VTI: 1.96 cm2
S' Lateral: 3.5 cm
Weight: 3337.59 [oz_av]

## 2023-02-05 LAB — HEPARIN LEVEL (UNFRACTIONATED): Heparin Unfractionated: 0.5 [IU]/mL (ref 0.30–0.70)

## 2023-02-05 LAB — CBC
HCT: 36 % (ref 36.0–46.0)
Hemoglobin: 11.7 g/dL — ABNORMAL LOW (ref 12.0–15.0)
MCH: 29.5 pg (ref 26.0–34.0)
MCHC: 32.5 g/dL (ref 30.0–36.0)
MCV: 90.9 fL (ref 80.0–100.0)
Platelets: 254 10*3/uL (ref 150–400)
RBC: 3.96 MIL/uL (ref 3.87–5.11)
RDW: 15.2 % (ref 11.5–15.5)
WBC: 10.6 10*3/uL — ABNORMAL HIGH (ref 4.0–10.5)
nRBC: 0 % (ref 0.0–0.2)

## 2023-02-05 LAB — GLUCOSE, CAPILLARY
Glucose-Capillary: 124 mg/dL — ABNORMAL HIGH (ref 70–99)
Glucose-Capillary: 112 mg/dL — ABNORMAL HIGH (ref 70–99)
Glucose-Capillary: 124 mg/dL — ABNORMAL HIGH (ref 70–99)
Glucose-Capillary: 115 mg/dL — ABNORMAL HIGH (ref 70–99)

## 2023-02-05 LAB — MAGNESIUM: Magnesium: 2.3 mg/dL (ref 1.7–2.4)

## 2023-02-05 MED ORDER — METOPROLOL SUCCINATE ER 100 MG PO TB24
100.0000 mg | ORAL_TABLET | Freq: Two times a day (BID) | ORAL | Status: DC
Start: 2023-02-05 — End: 2023-02-11
  Administered 2023-02-05 – 2023-02-11 (×13): 100 mg via ORAL
  Filled 2023-02-05 (×3): qty 1
  Filled 2023-02-05: qty 2
  Filled 2023-02-05: qty 1
  Filled 2023-02-05 (×5): qty 2
  Filled 2023-02-05: qty 1
  Filled 2023-02-05: qty 2
  Filled 2023-02-05: qty 1

## 2023-02-05 MED ORDER — GADOBUTROL 1 MMOL/ML IV SOLN
10.0000 mL | Freq: Once | INTRAVENOUS | Status: AC | PRN
  Administered 2023-02-05: 10 mL via INTRAVENOUS

## 2023-02-05 MED ORDER — CLONAZEPAM 0.25 MG PO TBDP
0.2500 mg | ORAL_TABLET | Freq: Once | ORAL | Status: AC
  Administered 2023-02-05: 0.25 mg via ORAL
  Filled 2023-02-05: qty 1

## 2023-02-05 MED ORDER — WARFARIN SODIUM 5 MG PO TABS
7.5000 mg | ORAL_TABLET | Freq: Once | ORAL | Status: AC
  Administered 2023-02-05: 7.5 mg via ORAL
  Filled 2023-02-05: qty 1

## 2023-02-05 MED ORDER — WARFARIN - PHARMACIST DOSING INPATIENT
Freq: Every day | Status: DC
  Administered 2023-02-06: 1

## 2023-02-05 MED FILL — Bupivacaine HCl Preservative Free (PF) Inj 0.25%: INTRAMUSCULAR | Qty: 60 | Status: AC

## 2023-02-05 NOTE — Telephone Encounter (Signed)
Kimberly Simpson, RN  P Cv Div Ash Anticoag There has been a change in this patients plan of care. She will not need to start weekly INRs for Tikosyn. Thank you! Stacy  Received message above. Pt no longer needs weekly INR checks for Tikosyn admission. Updated Coumadin Clinic appt note.

## 2023-02-05 NOTE — Progress Notes (Signed)
Patient has declined Cpap for the night

## 2023-02-05 NOTE — Progress Notes (Signed)
Patient Name: Kimberly Hobbs Date of Encounter: 02/05/2023  Primary Cardiologist: Thomasene Ripple, DO Electrophysiologist: Regan Lemming, MD  Interval Summary   The patient reports she had at least one episode of VT overnight.  States she was scared with the conversation that the overnight MD had with her regarding advanced support. Received amiodarone 150mg  bolus overnight for AF  At this time, the patient denies chest pain, shortness of breath, or any new concerns.  Vital Signs    Vitals:   02/05/23 0400 02/05/23 0500 02/05/23 0600 02/05/23 0650  BP: 111/87 (!) 94/53 95/74   Pulse: (!) 104 94 99   Resp: 17 17 (!) 21   Temp:    97.9 F (36.6 C)  TempSrc:    Oral  SpO2: 96% 98% 97%   Weight:      Height:        Intake/Output Summary (Last 24 hours) at 02/05/2023 0715 Last data filed at 02/05/2023 0600 Gross per 24 hour  Intake 1028.42 ml  Output 5 ml  Net 1023.42 ml   Filed Weights   02/03/23 1530 02/04/23 0600  Weight: 94.6 kg 93.2 kg    Physical Exam    GEN- The patient is well appearing, alert and oriented x 3 today.   Lungs- Clear to ausculation bilaterally, normal work of breathing Cardiac- Irregularly irregular rate and rhythm, no murmurs, rubs or gallops GI- soft, NT, ND, + BS Extremities- no clubbing or cyanosis. No edema  Telemetry    AF 100-120's, one episode of VT ~1945 on 12/17 (personally reviewed)  Hospital Course    Kimberly Hobbs is a 62 y.o. female with PMH  AF, CAD s/p CABG rheumatic heart disease s/p mechanical MVR / AVR, CKD, DM, HLD admitted for WCT.  S/p DCCV in ER. Troponin, BNP negative. LHC deferred. Recurrent VT on 12/17 s/p DCCV x2. For EPS 12/17 but unable to induce VT.   Assessment & Plan    Wide Complex Tachycardia  No underlying flutter waves seen with adenosine 12mg . S/p DCCV x1 at 200j with anterior manual pressure.  NSR restored.  Recurrent VT during admit requiring second DCCV 12/17. Unable to induce VT on EPS.   -continue amiodarone IV  -increase Toprol to 100 mg BID  -cMRI pending  -would not be VT ablation candidate due to mechanical MVR/AVR   AF/AFL Hx AF ablation 09/05/22, CHA2DS2-VASc 5. Recurrent AFL, metoprolol was increased on 01/20/23 -BB as above -OAC for stroke prophylaxis  -consider AF ablation in future  -continue amiodarone > would defer tikosyn    Secondary Hypercoagulable State  -warfarin per pharmacy    CAD  Rheumatic Valve Disease s/p Mechanical MVR/AVR 2018 -coumadin per pharmacy  -continue imdur -spiro on hold  -crestor   DM  -SSI -hold home metformin    OSA  -CPAP at bedtime    For questions or updates, please contact [facility] Please consult [email or web address] for contact info under Cardiology/STEMI.  Signed, Canary Brim, MSN, APRN, NP-C, AGACNP-BC [facility] - Electrophysiology  02/05/2023, 7:15 AM  I have seen and examined this patient with Canary Brim.  Agree with above, note added to reflect my findings.  No acute complaints.  On IV amiodarone.  GEN: Well nourished, well developed, in no acute distress  HEENT: normal  Neck: no JVD, carotid bruits, or masses Cardiac: Irregular; no murmurs, rubs, or gallops,no edema  Respiratory:  clear to auscultation bilaterally, normal work of breathing GI: soft, nontender, nondistended, + BS MS: no  deformity or atrophy  Skin: warm and dry Neuro:  Strength and sensation are intact Psych: euthymic mood, full affect   VT storm: Patient continuing to have episodes of VT and thus amiodarone has been started.  Will continue IV load for now.  Cardiac MRI pending. Persistent atrial fibrillation/flutter: Post ablation x 2.  Continue to have episodes of atrial fibrillation.  Will continue amiodarone. Coronary artery disease: No acute chest pain Valvular heart disease: Stable on most recent echo    Will [name] 02/06/2023 1:37 PM

## 2023-02-05 NOTE — Progress Notes (Addendum)
PHARMACY - ANTICOAGULATION CONSULT NOTE  Pharmacy Consult for heparin bridge while warfarin on hold Indication: atrial fibrillation and mechanical MVR and mechanical AVR  Allergies  Allergen Reactions   Duloxetine Hcl Other (See Comments)    Behavioral changes, sleep disturbances   Iodinated Contrast Media Other (See Comments)    Patient is unsure of reaction type   Penicillins Other (See Comments)    Immune to drug , does not work per patient     Patient Measurements: Height: 5\' 5"  (165.1 cm) Weight: 93.2 kg (205 lb 7.5 oz) IBW/kg (Calculated) : 57 Heparin Dosing Weight: 78.3 kg  Vital Signs: Temp: 97.8 F (36.6 C) (12/18 0807) Temp Source: Axillary (12/18 0807) BP: 108/62 (12/18 0800) Pulse Rate: 95 (12/18 0800)  Labs: Recent Labs    02/03/23 1545 02/03/23 1627 02/04/23 0014 02/04/23 0829 02/04/23 2232 02/05/23 0824  HGB 12.7  --  10.8*  --   --  11.7*  HCT 38.2  --  33.1*  --   --  36.0  PLT 384  --  249  --   --  254  LABPROT  --  28.0*  --  26.4*  --   --   INR  --  2.6*  --  2.4*  --   --   HEPARINUNFRC  --   --  0.29* 0.34  --  0.50  CREATININE 1.41*  --  1.44*  --  1.66* 1.30*  TROPONINIHS 7 9  --   --   --   --     Estimated Creatinine Clearance: 51.3 mL/min (A) (by C-G formula based on SCr of 1.3 mg/dL (H)).   Medical History: Past Medical History:  Diagnosis Date   Allergy ?   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  01/01/2017 01/01/2017    Medications:  Medications Prior to Admission  Medication Sig Dispense Refill Last Dose/Taking   albuterol (VENTOLIN HFA) 108 (90 Base) MCG/ACT inhaler Inhale 1-2 puffs into the lungs every 6 (six) hours as needed for wheezing or shortness of breath.   Past Month   aspirin EC 81 MG tablet Take 81 mg by mouth daily.    02/03/2023   clonazePAM (KLONOPIN) 0.5 MG tablet Take 1.5 tablets (0.75 mg total) by mouth at bedtime. 45 tablet 0 02/02/2023   cyclobenzaprine (FLEXERIL) 10 MG tablet Take 1 tablet (10 mg total) by mouth at bedtime as needed for muscle spasms. 30 tablet 3 Taking As Needed   diclofenac Sodium (VOLTAREN ARTHRITIS PAIN) 1 % GEL Apply 4 g topically 4 (four) times daily. (Patient taking differently: Apply 4 g topically 4 (four) times daily as needed (pain).) 150 g 5 Taking Differently   diltiazem (CARDIZEM) 30 MG tablet Take 1 tablet every 4 hours AS NEEDED for AFIB heart rate >100 as long as top BP >100. 30 tablet 2 02/03/2023   empagliflozin (JARDIANCE) 10 MG TABS tablet Take  10 mg by mouth daily.   02/03/2023   furosemide (LASIX) 20 MG tablet Take 1 tablet (20 mg total) by mouth 2 (two) times daily. 60 tablet 3 02/03/2023   gabapentin (NEURONTIN) 100 MG capsule Take 1 capsule (100 mg total) by mouth at bedtime. 30 capsule 5 02/02/2023   HYDROcodone-acetaminophen (NORCO) 7.5-325 MG tablet Take 1 tablet by mouth every 12 (twelve) hours as needed for moderate pain (pain score 4-6). 60 tablet 0 02/02/2023   isosorbide mononitrate (IMDUR) 60 MG 24 hr tablet Take 1 tablet (60 mg total) by mouth daily. 90 tablet 3 02/03/2023   losartan (COZAAR) 50 MG tablet HOLD while on higher dose of metoprolol 12/11   Taking   metFORMIN (GLUCOPHAGE) 500 MG tablet Take 1 tablet (500 mg total) by mouth 2 (two) times daily with a meal. 180 tablet 3 02/03/2023   metoprolol succinate (TOPROL XL) 50 MG 24 hr tablet Take 1 tablet (50 mg total) by mouth 2 (two) times daily. 60 tablet 3  02/03/2023   montelukast (SINGULAIR) 10 MG tablet Take 1 tablet (10 mg total) by mouth daily. 30 tablet 11 02/02/2023   nitroGLYCERIN (NITROSTAT) 0.4 MG SL tablet DISSOLVE 1 TABLET UNDER THE TONGUE EVERY 5 MINUTES FOR 3 DOSES AS NEEDED FOR CHEST PAIN (Patient taking differently: Place 0.4 mg under the tongue every 5 (five) minutes as needed for chest pain.) 25 tablet 3 Taking Differently   ondansetron (ZOFRAN) 8 MG tablet Take 1 tablet (8 mg total) by mouth every 8 (eight) hours as needed for nausea or vomiting. 20 tablet 0 Taking As Needed   potassium chloride SA (KLOR-CON M) 20 MEQ tablet Take 1 tablet (20 mEq total) by mouth 2 (two) times daily. (Patient taking differently: Take 40 mEq by mouth daily.) 180 tablet 3 02/03/2023   rosuvastatin (CRESTOR) 40 MG tablet Take 1 tablet (40 mg total) by mouth daily. 90 tablet 1 02/02/2023   spironolactone (ALDACTONE) 50 MG tablet Take 1 tablet (50 mg total) by mouth daily. 90 tablet 3 02/03/2023   Tiotropium Bromide-Olodaterol (STIOLTO RESPIMAT) 2.5-2.5 MCG/ACT AERS Inhale 2 puffs into the lungs daily. 4 g 6 02/03/2023   warfarin (COUMADIN) 5 MG tablet Take 7.5 mg on Monday and Wed and 5 mg all other days (Patient taking differently: Take 5 mg by mouth See admin instructions. Take 5mg  (one tablet) once daily on Tuesdays, Thursdays, Saturdays, and Sundays. To be taken in addition to warfarin 7.5mg  once daily on Mondays, Wednesdays, and Fridays.) 90 tablet 3 02/02/2023 at  8:00 PM   Accu-Chek Softclix Lancets lancets Use as instructed (Patient taking differently: 1 each by Other route See admin instructions. Use as instructed) 100 each 12    Blood Glucose Monitoring Suppl (ACCU-CHEK GUIDE) w/Device KIT Check glucose every morning (Patient taking differently: 1 each by Other route See admin instructions. Check glucose every morning) 1 kit 0    dapagliflozin propanediol (FARXIGA) 5 MG TABS tablet Take 5 mg by mouth daily.      glucose blood (ACCU-CHEK GUIDE) test  strip Use as instructed (Patient taking differently: 1 each by Other route as needed for other (Glucose chesk). Use as instructed) 100 each 12    vortioxetine HBr (TRINTELLIX) 5 MG TABS tablet Take 1 tablet (5 mg total) by mouth at bedtime. (Patient not taking: Reported on 02/03/2023) 30 tablet 2 Not Taking    Assessment: 62 yo F presents with wide-complex tachycardia now in NSR s/p DCCV x1 shock. Patient has a history of rheumatic  heart disease and is s/p mechanical MVR and mechanical AVR in 2018 and Afib anticoagulated with warfarin with goal INR of 2.5-3.5 PTA. Patient is being admitted for VT work-up per cardiology recs and pharmacy has been consulted to bridge heparin while off of warfarin.   She is s/p EP study 12/17 and heparin was restarted -heparin level= 0.5 on 1500 units/hr  PTA warfarin dose: 5mg /d except take 7.5mg  on MWF. Last clinic visit 12/10 w/ INR 3.2     Goal of Therapy:  Heparin level 0.3-0.7 units/ml INR 2.5-3.5 Monitor platelets by anticoagulation protocol: Yes   Plan:  -Warfarin on hold for possible procedures -Continue heparin 1500 units/hr  -Daily heparin level and CBC -Daily INR  Harland German, PharmD Clinical Pharmacist **Pharmacist phone directory can now be found on [email or web address] ([email or web address]).  Listed under [facility].   Addendum -Warfarin to be resumed -INR 2.4 on 12.17  Plan -Warfarin 7.5mg  today  Harland German, PharmD Clinical Pharmacist **Pharmacist phone directory can now be found on [email or web address] ([email or web address]).  Listed under [facility].

## 2023-02-06 ENCOUNTER — Telehealth (HOSPITAL_COMMUNITY): Payer: Self-pay

## 2023-02-06 ENCOUNTER — Other Ambulatory Visit (HOSPITAL_COMMUNITY): Payer: Self-pay

## 2023-02-06 DIAGNOSIS — I472 Ventricular tachycardia, unspecified: Secondary | ICD-10-CM | POA: Diagnosis not present

## 2023-02-06 LAB — GLUCOSE, CAPILLARY
Glucose-Capillary: 98 mg/dL (ref 70–99)
Glucose-Capillary: 95 mg/dL (ref 70–99)
Glucose-Capillary: 107 mg/dL — ABNORMAL HIGH (ref 70–99)
Glucose-Capillary: 110 mg/dL — ABNORMAL HIGH (ref 70–99)

## 2023-02-06 LAB — PROTIME-INR
INR: 1.8 — ABNORMAL HIGH (ref 0.8–1.2)
Prothrombin Time: 21.3 s — ABNORMAL HIGH (ref 11.4–15.2)

## 2023-02-06 LAB — CBC
HCT: 32.7 % — ABNORMAL LOW (ref 36.0–46.0)
Hemoglobin: 10.3 g/dL — ABNORMAL LOW (ref 12.0–15.0)
MCH: 29.4 pg (ref 26.0–34.0)
MCHC: 31.5 g/dL (ref 30.0–36.0)
MCV: 93.4 fL (ref 80.0–100.0)
Platelets: 229 10*3/uL (ref 150–400)
RBC: 3.5 MIL/uL — ABNORMAL LOW (ref 3.87–5.11)
RDW: 15.4 % (ref 11.5–15.5)
WBC: 12.9 10*3/uL — ABNORMAL HIGH (ref 4.0–10.5)
nRBC: 0 % (ref 0.0–0.2)

## 2023-02-06 LAB — HEPARIN LEVEL (UNFRACTIONATED): Heparin Unfractionated: 0.54 [IU]/mL (ref 0.30–0.70)

## 2023-02-06 LAB — SURGICAL PCR SCREEN
MRSA, PCR: NEGATIVE
Staphylococcus aureus: NEGATIVE

## 2023-02-06 MED ORDER — SODIUM CHLORIDE 0.9% FLUSH: 3.0000 mL | INTRAVENOUS | Status: DC | PRN

## 2023-02-06 MED ORDER — CEFAZOLIN SODIUM-DEXTROSE 2-4 GM/100ML-% IV SOLN: 2.0000 g | INTRAVENOUS | Status: DC

## 2023-02-06 MED ORDER — SODIUM CHLORIDE 0.9 % IV SOLN: INTRAVENOUS | Status: DC

## 2023-02-06 MED ORDER — SODIUM CHLORIDE 0.9 % IV SOLN: 250.0000 mL | INTRAVENOUS | Status: AC

## 2023-02-06 MED ORDER — SODIUM CHLORIDE 0.9 % IV SOLN
80.0000 mg | INTRAVENOUS | Status: DC
  Filled 2023-02-06: qty 2

## 2023-02-06 MED ORDER — CHLORHEXIDINE GLUCONATE 4 % EX SOLN
60.0000 mL | Freq: Once | CUTANEOUS | Status: AC
  Administered 2023-02-06: 4 via TOPICAL
  Filled 2023-02-06: qty 60

## 2023-02-06 MED ORDER — SODIUM CHLORIDE 0.9% FLUSH
3.0000 mL | Freq: Two times a day (BID) | INTRAVENOUS | Status: DC
  Administered 2023-02-06 – 2023-02-07 (×3): 3 mL via INTRAVENOUS

## 2023-02-06 MED ORDER — SACUBITRIL-VALSARTAN 24-26 MG PO TABS
1.0000 | ORAL_TABLET | Freq: Two times a day (BID) | ORAL | Status: DC
  Administered 2023-02-06 – 2023-02-11 (×11): 1 via ORAL
  Filled 2023-02-06 (×12): qty 1

## 2023-02-06 MED ORDER — CHLORHEXIDINE GLUCONATE 4 % EX SOLN
60.0000 mL | Freq: Once | CUTANEOUS | Status: DC
  Filled 2023-02-06: qty 60

## 2023-02-06 MED ORDER — SENNOSIDES-DOCUSATE SODIUM 8.6-50 MG PO TABS
1.0000 | ORAL_TABLET | Freq: Once | ORAL | Status: AC | PRN
  Administered 2023-02-06: 1 via ORAL
  Filled 2023-02-06: qty 1

## 2023-02-06 MED ORDER — WARFARIN SODIUM 5 MG PO TABS
7.5000 mg | ORAL_TABLET | Freq: Once | ORAL | Status: AC
  Administered 2023-02-06: 7.5 mg via ORAL
  Filled 2023-02-06: qty 1

## 2023-02-06 NOTE — Progress Notes (Addendum)
Patient Name: Kimberly Hobbs Date of Encounter: 02/06/2023  Primary Cardiologist: Thomasene Ripple, DO Electrophysiologist: Cathalina Barcia Kimberly Loa, MD  Interval Summary   The patient is doing well today.  Concerned about her boyfriend post knee surgery > he is at [facility]  At this time, the patient denies chest pain, shortness of breath, or any new concerns.  Vital Signs    Vitals:   02/06/23 0400 02/06/23 0500 02/06/23 0600 02/06/23 0800  BP: (!) 122/48 (!) 126/38 (!) 123/45   Pulse: (!) 47 (!) 47 (!) 49   Resp: 19 18 18    Temp:    98.4 F (36.9 C)  TempSrc:    Oral  SpO2: 96% 97% 97%   Weight:   92.6 kg   Height:        Intake/Output Summary (Last 24 hours) at 02/06/2023 0837 Last data filed at 02/06/2023 0600 Gross per 24 hour  Intake 1573.4 ml  Output --  Net 1573.4 ml   Filed Weights   02/03/23 1530 02/04/23 0600 02/06/23 0600  Weight: 94.6 kg 93.2 kg 92.6 kg    Physical Exam    GEN- The patient is well appearing, alert and oriented x 3 today.   Lungs- Clear to ausculation bilaterally, normal work of breathing Cardiac- Regular rate and rhythm, PVC's, no murmurs, rubs or gallops GI- soft, NT, ND, + BS Extremities- no clubbing or cyanosis. No edema  Telemetry    AF 110-120s until 1630 on 12/18 > SB/SR  40-60's, one episode of NSVT, multifocal PVC's  (personally reviewed)  Hospital Course    Kimberly Hobbs is a 62 y.o. female  with PMH  AF, CAD s/p CABG rheumatic heart disease s/p mechanical MVR / AVR, CKD, DM, HLD admitted for WCT.  S/p DCCV in ER. Troponin, BNP negative. LHC deferred. Recurrent VT on 12/17 s/p DCCV x2. For EPS 12/17 but unable to induce VT.   Assessment & Plan    Wide Complex Tachycardia  No underlying flutter waves seen with adenosine 12mg . S/p DCCV x1 at 200j with anterior manual pressure.  NSR restored.  Recurrent VT during admit requiring second DCCV 12/17. Unable to induce VT on EPS.  -continue amiodarone 60mg /hr, consider reduction  to 30mg /hr  -toprol 100 mg BID  -baseline LVEF 55% from 2020  -cMRI with LVEF 40%, RV insertion site LGE (non-specific scar finding in setting of elevated pulmonary pressures), dilated main pulmonary artery -given LVEF (may be transiently low with VT), VT requiring shock, likely Kimberly Hobbs need ICD placed for secondary prevention, timing to be determined  -would not be a VT ablation candidate due to mechanical MVR/AVR  Acute Systolic CHF  -add low dose entresto, no ACE/ARB in last 72h   AF/AFL Hx AF ablation 09/05/22, CHA2DS2-VASc 5. Recurrent AFL, metoprolol was increased on 01/20/23 -beta blocker as above  -OAC for mechanical valve / stroke prophylaxis  -consider AF ablation in future  -continue amiodarone, defer previously planned tikosyn    Secondary Hypercoagulable State  -heparin / warfarin per pharmacy    CAD  Rheumatic Valve Disease s/p Mechanical MVR/AVR 2018 -continue imdur, spiro on hold  -crestor   DM  -SSI -hold home metformin while inpatient   OSA  -pt refuses CPAP   For questions or updates, please contact [facility] Please consult [email or web address] for contact info under Cardiology/STEMI.  Signed, Canary Brim, MSN, APRN, NP-C, AGACNP-BC [facility] - Electrophysiology  02/06/2023, 8:46 AM  I have seen and examined this patient with Canary Brim.  Agree with above, note added to reflect my findings.  Patient converted to sinus rhythm.  No further prolonged episodes of ventricular tachycardia.  GEN: Well nourished, well developed, in no acute distress  HEENT: normal  Neck: no JVD, carotid bruits, or masses Cardiac: RRR; no murmurs, rubs, or gallops,no edema  Respiratory:  clear to auscultation bilaterally, normal work of breathing GI: soft, nontender, nondistended, + BS MS: no deformity or atrophy  Skin: warm and dry, device site well healed Neuro:  Strength and sensation are intact Psych: euthymic mood, full affect   Ventricular tachycardia:  Cardiac MRI with V insertion site LGE.  Ejection fraction 40%.  Kimberly Hobbs continue amiodarone for now.  Likely plan for ICD implant tomorrow. New onset systolic heart failure: Found on cardiac MRI with reduced ejection fraction.  On beta-blocker.  Kimberly Hobbs add Entresto. Persistent atrial fibrillation/flutter: Post ablation x 2.  Is converted to sinus rhythm. Coronary artery disease: No current chest pain Rheumatic heart disease: Post MVR and AVR with mechanical valves.  Due to mechanical valves, no plan for ablation.  Kimberly Hobbs M. Kimberly Nicoson MD 02/06/2023 1:38 PM

## 2023-02-06 NOTE — Telephone Encounter (Signed)
Pharmacy Patient Advocate Encounter  Insurance verification completed.    The patient is insured through [facility]. Patient has Medicare and is not eligible for a copay card, but may be able to apply for patient assistance, if available.    Ran test claim for Lovenox and the current 30 day co-pay is $0.00.  Ran test claim for Entresto and the current 30 day co-pay is $0.00.  This test claim was processed through [facility]- copay amounts may vary at other pharmacies due to pharmacy/plan contracts, or as the patient moves through the different stages of their insurance plan.

## 2023-02-06 NOTE — Plan of Care (Signed)
  Problem: Clinical Measurements: Goal: Ability to maintain clinical measurements within normal limits will improve Outcome: Progressing   Problem: Coping: Goal: Level of anxiety will decrease Outcome: Progressing   Problem: Nutritional: Goal: Maintenance of adequate nutrition will improve Outcome: Progressing

## 2023-02-06 NOTE — Progress Notes (Addendum)
PHARMACY - ANTICOAGULATION CONSULT NOTE  Pharmacy Consult for heparin, warfarin Indication: atrial fibrillation and mechanical MVR and mechanical AVR  Allergies  Allergen Reactions   Duloxetine Hcl Other (See Comments)    Behavioral changes, sleep disturbances   Iodinated Contrast Media Other (See Comments)    Patient is unsure of reaction type   Penicillins Other (See Comments)    Immune to drug , does not work per patient     Patient Measurements: Height: 5\' 5"  (165.1 cm) Weight: 92.6 kg (204 lb 2.3 oz) IBW/kg (Calculated) : 57 Heparin Dosing Weight: 78.3 kg  Vital Signs: Temp: 98.4 F (36.9 C) (12/19 0800) Temp Source: Oral (12/19 0800) BP: 139/50 (12/19 1025) Pulse Rate: 55 (12/19 1025)  Labs: Recent Labs    02/03/23 1545 02/03/23 1545 02/03/23 1627 02/04/23 0014 02/04/23 0829 02/04/23 2232 02/05/23 0824 02/06/23 0234  HGB 12.7  --   --  10.8*  --   --  11.7* 10.3*  HCT 38.2  --   --  33.1*  --   --  36.0 32.7*  PLT 384  --   --  249  --   --  254 229  LABPROT  --   --  28.0*  --  26.4*  --   --  21.3*  INR  --   --  2.6*  --  2.4*  --   --  1.8*  HEPARINUNFRC  --    < >  --  0.29* 0.34  --  0.50 0.54  CREATININE 1.41*  --   --  1.44*  --  1.66* 1.30*  --   TROPONINIHS 7  --  9  --   --   --   --   --    < > = values in this interval not displayed.    Estimated Creatinine Clearance: 51.1 mL/min (A) (by C-G formula based on SCr of 1.3 mg/dL (H)).   Medical History: Past Medical History:  Diagnosis Date   Allergy ?   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   COPD (chronic obstructive pulmonary disease) (HCC)    COVID-19    GERD (gastroesophageal reflux disease)    H/O mitral valve replacement with mechanical #25 mechanical SJM 01/01/2017) 01/01/2017   Hyperlipidemia    Hypertension    Pain in both lower extremities 07/23/2019   Paroxysmal atrial fibrillation (HCC)  06/29/2015   Rheumatic heart disease    MS/ AS   Sleep apnea    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 01/01/2017 01/01/2017    Medications:  Medications Prior to Admission  Medication Sig Dispense Refill Last Dose/Taking   albuterol (VENTOLIN HFA) 108 (90 Base) MCG/ACT inhaler Inhale 1-2 puffs into the lungs every 6 (six) hours as needed for wheezing or shortness of breath.   Past Month   aspirin EC 81 MG tablet Take 81 mg by mouth daily.    02/03/2023   clonazePAM (KLONOPIN) 0.5 MG tablet Take 1.5 tablets (0.75 mg total) by mouth at bedtime. 45 tablet 0 02/02/2023   cyclobenzaprine (FLEXERIL) 10 MG tablet Take 1 tablet (10 mg total) by mouth at bedtime as needed for muscle spasms. 30 tablet 3 Taking As Needed   diclofenac Sodium (VOLTAREN ARTHRITIS PAIN) 1 % GEL Apply 4 g topically 4 (four) times daily. (Patient taking differently: Apply 4 g topically 4 (four) times  daily as needed (pain).) 150 g 5 Taking Differently   diltiazem (CARDIZEM) 30 MG tablet Take 1 tablet every 4 hours AS NEEDED for AFIB heart rate >100 as long as top BP >100. 30 tablet 2 02/03/2023   furosemide (LASIX) 20 MG tablet Take 1 tablet (20 mg total) by mouth 2 (two) times daily. 60 tablet 3 02/03/2023   gabapentin (NEURONTIN) 100 MG capsule Take 1 capsule (100 mg total) by mouth at bedtime. 30 capsule 5 02/02/2023   HYDROcodone-acetaminophen (NORCO) 7.5-325 MG tablet Take 1 tablet by mouth every 12 (twelve) hours as needed for moderate pain (pain score 4-6). 60 tablet 0 02/02/2023   isosorbide mononitrate (IMDUR) 60 MG 24 hr tablet Take 1 tablet (60 mg total) by mouth daily. 90 tablet 3 02/03/2023   losartan (COZAAR) 50 MG tablet HOLD while on higher dose of metoprolol 12/11   Taking   metFORMIN (GLUCOPHAGE) 500 MG tablet Take 1 tablet (500 mg total) by mouth 2 (two) times daily with a meal. 180 tablet 3 02/03/2023   metoprolol succinate (TOPROL XL) 50 MG 24 hr tablet Take 1 tablet (50 mg total) by  mouth 2 (two) times daily. 60 tablet 3 02/03/2023   montelukast (SINGULAIR) 10 MG tablet Take 1 tablet (10 mg total) by mouth daily. 30 tablet 11 02/02/2023   nitroGLYCERIN (NITROSTAT) 0.4 MG SL tablet DISSOLVE 1 TABLET UNDER THE TONGUE EVERY 5 MINUTES FOR 3 DOSES AS NEEDED FOR CHEST PAIN (Patient taking differently: Place 0.4 mg under the tongue every 5 (five) minutes as needed for chest pain.) 25 tablet 3 Taking Differently   ondansetron (ZOFRAN) 8 MG tablet Take 1 tablet (8 mg total) by mouth every 8 (eight) hours as needed for nausea or vomiting. 20 tablet 0 Taking As Needed   potassium chloride SA (KLOR-CON M) 20 MEQ tablet Take 1 tablet (20 mEq total) by mouth 2 (two) times daily. (Patient taking differently: Take 40 mEq by mouth daily.) 180 tablet 3 02/03/2023   rosuvastatin (CRESTOR) 40 MG tablet Take 1 tablet (40 mg total) by mouth daily. 90 tablet 1 02/02/2023   spironolactone (ALDACTONE) 50 MG tablet Take 1 tablet (50 mg total) by mouth daily. 90 tablet 3 02/03/2023   Tiotropium Bromide-Olodaterol (STIOLTO RESPIMAT) 2.5-2.5 MCG/ACT AERS Inhale 2 puffs into the lungs daily. 4 g 6 02/03/2023   warfarin (COUMADIN) 5 MG tablet Take 7.5 mg on Monday and Wed and 5 mg all other days (Patient taking differently: Take 5 mg by mouth See admin instructions. Take 5mg  (one tablet) once daily on Tuesdays, Thursdays, Saturdays, and Sundays. To be taken in addition to warfarin 7.5mg  once daily on Mondays, Wednesdays, and Fridays.) 90 tablet 3 02/02/2023 at  8:00 PM   Accu-Chek Softclix Lancets lancets Use as instructed (Patient taking differently: 1 each by Other route See admin instructions. Use as instructed) 100 each 12    Blood Glucose Monitoring Suppl (ACCU-CHEK GUIDE) w/Device KIT Check glucose every morning (Patient taking differently: 1 each by Other route See admin instructions. Check glucose every morning) 1 kit 0    dapagliflozin propanediol (FARXIGA) 5 MG TABS tablet Take 5 mg by mouth daily.       glucose blood (ACCU-CHEK GUIDE) test strip Use as instructed (Patient taking differently: 1 each by Other route as needed for other (Glucose chesk). Use as instructed) 100 each 12    vortioxetine HBr (TRINTELLIX) 5 MG TABS tablet Take 1 tablet (5 mg total) by mouth at bedtime. (Patient not taking: Reported  on 02/03/2023) 30 tablet 2 Not Taking    Assessment: 62 yo F presents with wide-complex tachycardia now in NSR s/p DCCV x1 shock. Patient has a history of rheumatic heart disease and is s/p mechanical MVR and mechanical AVR in 2018 and Afib anticoagulated with warfarin with goal INR of 2.5-3.5 PTA. Patient is being admitted for VT work-up per cardiology recs and pharmacy has been consulted to bridge heparin while off of warfarin.   She is s/p EP study 12/17 and heparin was restarted and warfarin was restarted 12/18 -heparin level= 0.54 on 1500 units/hr -INR 2.4> 1.8 -she in noted on amiodarone which will increase warfarin sensitivity  PTA warfarin dose: 5mg /d except take 7.5mg  on MWF. Last clinic visit 12/10 w/ INR 3.2     Goal of Therapy:  Heparin level 0.3-0.7 units/ml INR 2.5-3.5 Monitor platelets by anticoagulation protocol: Yes   Plan:  -Continue heparin 1500 units/hr  -Daily heparin level and CBC -Warfarin 7.5mg  po today -Daily INR  Harland German, PharmD Clinical Pharmacist **Pharmacist phone directory can now be found on [email or web address] ([email or web address]).  Listed under [facility].   Harland German, PharmD Clinical Pharmacist **Pharmacist phone directory can now be found on [email or web address] ([email or web address]).  Listed under [facility].

## 2023-02-06 NOTE — Progress Notes (Signed)
Patient refused Cpap for the night

## 2023-02-07 DIAGNOSIS — I472 Ventricular tachycardia, unspecified: Secondary | ICD-10-CM | POA: Diagnosis not present

## 2023-02-07 LAB — GLUCOSE, CAPILLARY
Glucose-Capillary: 111 mg/dL — ABNORMAL HIGH (ref 70–99)
Glucose-Capillary: 95 mg/dL (ref 70–99)
Glucose-Capillary: 97 mg/dL (ref 70–99)
Glucose-Capillary: 126 mg/dL — ABNORMAL HIGH (ref 70–99)

## 2023-02-07 LAB — CBC
HCT: 29.4 % — ABNORMAL LOW (ref 36.0–46.0)
Hemoglobin: 9.4 g/dL — ABNORMAL LOW (ref 12.0–15.0)
MCH: 29.7 pg (ref 26.0–34.0)
MCHC: 32 g/dL (ref 30.0–36.0)
MCV: 92.7 fL (ref 80.0–100.0)
Platelets: 213 10*3/uL (ref 150–400)
RBC: 3.17 MIL/uL — ABNORMAL LOW (ref 3.87–5.11)
RDW: 15.7 % — ABNORMAL HIGH (ref 11.5–15.5)
WBC: 9.4 10*3/uL (ref 4.0–10.5)
nRBC: 0.2 % (ref 0.0–0.2)

## 2023-02-07 LAB — BASIC METABOLIC PANEL
Anion gap: 11 (ref 5–15)
BUN: 23 mg/dL (ref 8–23)
CO2: 19 mmol/L — ABNORMAL LOW (ref 22–32)
Calcium: 9.2 mg/dL (ref 8.9–10.3)
Chloride: 107 mmol/L (ref 98–111)
Creatinine, Ser: 1.25 mg/dL — ABNORMAL HIGH (ref 0.44–1.00)
GFR, Estimated: 49 mL/min — ABNORMAL LOW (ref >60)
Glucose, Bld: 136 mg/dL — ABNORMAL HIGH (ref 70–99)
Potassium: 4.2 mmol/L (ref 3.5–5.1)
Sodium: 137 mmol/L (ref 135–145)

## 2023-02-07 LAB — PROTIME-INR
INR: 2 — ABNORMAL HIGH (ref 0.8–1.2)
Prothrombin Time: 22.6 s — ABNORMAL HIGH (ref 11.4–15.2)

## 2023-02-07 LAB — HEPARIN LEVEL (UNFRACTIONATED): Heparin Unfractionated: 0.42 [IU]/mL (ref 0.30–0.70)

## 2023-02-07 LAB — TROPONIN I (HIGH SENSITIVITY)
Troponin I (High Sensitivity): 5 ng/L (ref <18)
Troponin I (High Sensitivity): 5 ng/L (ref <18)

## 2023-02-07 MED ORDER — AMIODARONE IV BOLUS ONLY 150 MG/100ML
150.0000 mg | Freq: Once | INTRAVENOUS | Status: AC
  Administered 2023-02-07: 150 mg via INTRAVENOUS

## 2023-02-07 MED ORDER — WARFARIN SODIUM 5 MG PO TABS: 7.5000 mg | ORAL_TABLET | Freq: Once | ORAL | Status: DC

## 2023-02-07 MED ORDER — METHYLPREDNISOLONE SODIUM SUCC 125 MG IJ SOLR: 125.0000 mg | Freq: Once | INTRAMUSCULAR | Status: DC

## 2023-02-07 MED ORDER — DIPHENHYDRAMINE HCL 50 MG/ML IJ SOLN
25.0000 mg | Freq: Once | INTRAMUSCULAR | Status: DC
  Filled 2023-02-07: qty 1

## 2023-02-07 MED ORDER — DIPHENHYDRAMINE HCL 50 MG/ML IJ SOLN: 25.0000 mg | Freq: Once | INTRAMUSCULAR | Status: DC

## 2023-02-07 MED ORDER — SENNOSIDES-DOCUSATE SODIUM 8.6-50 MG PO TABS
1.0000 | ORAL_TABLET | Freq: Every day | ORAL | Status: DC
  Administered 2023-02-10: 1 via ORAL
  Filled 2023-02-07 (×3): qty 1

## 2023-02-07 MED ORDER — POLYETHYLENE GLYCOL 3350 17 G PO PACK
17.0000 g | PACK | Freq: Every day | ORAL | Status: DC | PRN
  Administered 2023-02-07: 17 g via ORAL
  Filled 2023-02-07: qty 1

## 2023-02-07 MED ORDER — POLYETHYLENE GLYCOL 3350 17 G PO PACK
17.0000 g | PACK | Freq: Every day | ORAL | Status: DC
  Administered 2023-02-08 – 2023-02-09 (×2): 17 g via ORAL
  Filled 2023-02-07 (×4): qty 1

## 2023-02-07 MED ORDER — CLONAZEPAM 0.5 MG PO TBDP
1.0000 mg | ORAL_TABLET | Freq: Every day | ORAL | Status: DC
  Administered 2023-02-07 – 2023-02-10 (×4): 1 mg via ORAL
  Filled 2023-02-07 (×4): qty 2

## 2023-02-07 MED ORDER — WARFARIN SODIUM 2.5 MG PO TABS
2.5000 mg | ORAL_TABLET | Freq: Once | ORAL | Status: AC
  Administered 2023-02-07: 2.5 mg via ORAL
  Filled 2023-02-07: qty 1

## 2023-02-07 MED ORDER — CLONAZEPAM 0.5 MG PO TABS
0.5000 mg | ORAL_TABLET | Freq: Every day | ORAL | Status: DC
  Administered 2023-02-08 – 2023-02-11 (×4): 0.5 mg via ORAL
  Filled 2023-02-07 (×4): qty 1

## 2023-02-07 MED ORDER — AMIODARONE HCL IN DEXTROSE 360-4.14 MG/200ML-% IV SOLN
60.0000 mg/h | INTRAVENOUS | Status: DC
  Administered 2023-02-07 – 2023-02-08 (×2): 60 mg/h via INTRAVENOUS
  Filled 2023-02-07: qty 200

## 2023-02-07 MED ORDER — CLONAZEPAM 0.5 MG PO TBDP
0.5000 mg | ORAL_TABLET | Freq: Once | ORAL | Status: AC
  Administered 2023-02-07: 0.5 mg via ORAL
  Filled 2023-02-07: qty 1

## 2023-02-07 MED ORDER — CLONAZEPAM 0.5 MG PO TBDP: 0.7500 mg | ORAL_TABLET | Freq: Every day | ORAL | Status: DC

## 2023-02-07 MED ORDER — AMIODARONE HCL IN DEXTROSE 360-4.14 MG/200ML-% IV SOLN
INTRAVENOUS | Status: AC
  Filled 2023-02-07: qty 200

## 2023-02-07 MED ORDER — METHYLPREDNISOLONE SODIUM SUCC 125 MG IJ SOLR
125.0000 mg | Freq: Once | INTRAMUSCULAR | Status: DC
  Filled 2023-02-07: qty 2

## 2023-02-07 NOTE — Progress Notes (Signed)
Patient refused Cpap for the night

## 2023-02-07 NOTE — Progress Notes (Signed)
At bedside to discuss impending ICD implant.  Patient very aware of abnormal rhythms when they occur.  States she feels as if "one is coming". SB currently.  0810 -0814 > pt had brief run of prolonged VT, broke spontaneously, then AF 90-100's, then recurrent VT at rate 130-135.   Amiodarone bolus 150mg , then gtt increased to 60mg /hr.  Patient remains awake/alert, BP stable.  Reports chest pressure, nausea with episode.  MD aware of events.    Plan: -assess STAT EKG, troponin -continue amiodarone 60mg /hr     Canary Brim, MSN, APRN, NP-C, AGACNP-BC [facility] - Electrophysiology  02/07/2023, 8:25 AM

## 2023-02-07 NOTE — Progress Notes (Addendum)
ICD implant canceled for today. Further episodes of NSVT.  Continue amiodarone infusion at 60mg /hr overnight. Patient tearful, anxious.  Klonopin added for situational anxiety.    -resume heparin gtt  -plan for ICD on Monday pending VT settling out -orders placed for procedure -NPO after MN on Monday 12/23 -note patient has contrast allergy > will need to time her pre-medications accordingly for case   Canary Brim, MSN, APRN, NP-C, AGACNP-BC [facility] - Electrophysiology  02/07/2023, 4:26 PM

## 2023-02-07 NOTE — Plan of Care (Signed)
  Problem: Education: Goal: Knowledge of General Education information will improve Description: Including pain rating scale, medication(s)/side effects and non-pharmacologic comfort measures Outcome: Progressing   Problem: Health Behavior/Discharge Planning: Goal: Ability to manage health-related needs will improve Outcome: Progressing   Problem: Clinical Measurements: Goal: Ability to maintain clinical measurements within normal limits will improve Outcome: Progressing Goal: Will remain free from infection Outcome: Progressing Goal: Diagnostic test results will improve Outcome: Progressing Goal: Respiratory complications will improve Outcome: Progressing Goal: Cardiovascular complication will be avoided Outcome: Progressing   Problem: Activity: Goal: Risk for activity intolerance will decrease Outcome: Progressing   Problem: Nutrition: Goal: Adequate nutrition will be maintained Outcome: Progressing   Problem: Coping: Goal: Level of anxiety will decrease Outcome: Progressing   Problem: Elimination: Goal: Will not experience complications related to bowel motility Outcome: Progressing Goal: Will not experience complications related to urinary retention Outcome: Progressing   Problem: Pain Management: Goal: General experience of comfort will improve Outcome: Progressing   Problem: Safety: Goal: Ability to remain free from injury will improve Outcome: Progressing   Problem: Skin Integrity: Goal: Risk for impaired skin integrity will decrease Outcome: Progressing   Problem: Education: Goal: Ability to describe self-care measures that may prevent or decrease complications (Diabetes Survival Skills Education) will improve Outcome: Progressing Goal: Individualized Educational Video(s) Outcome: Progressing   Problem: Coping: Goal: Ability to adjust to condition or change in health will improve Outcome: Progressing   Problem: Fluid Volume: Goal: Ability to  maintain a balanced intake and output will improve Outcome: Progressing   Problem: Health Behavior/Discharge Planning: Goal: Ability to identify and utilize available resources and services will improve Outcome: Progressing Goal: Ability to manage health-related needs will improve Outcome: Progressing   Problem: Metabolic: Goal: Ability to maintain appropriate glucose levels will improve Outcome: Progressing   Problem: Nutritional: Goal: Maintenance of adequate nutrition will improve Outcome: Progressing Goal: Progress toward achieving an optimal weight will improve Outcome: Progressing   Problem: Skin Integrity: Goal: Risk for impaired skin integrity will decrease Outcome: Progressing   Problem: Tissue Perfusion: Goal: Adequacy of tissue perfusion will improve Outcome: Progressing   Problem: Education: Goal: Understanding of disease, treatment, and recovery process will improve Outcome: Progressing   Problem: Activity: Goal: Ability to return to baseline activity level will improve Outcome: Progressing   Problem: Cardiac: Goal: Ability to maintain adequate cardiovascular perfusion will improve Outcome: Progressing Goal: Vascular access site(s) Level 0-1 will be maintained Outcome: Progressing   Problem: Health Behavior/ Discharge Planning: Goal: Ability to safely manage health related needs after discharge Outcome: Progressing   Problem: Education: Goal: Knowledge of cardiac device and self-care will improve Outcome: Progressing Goal: Ability to safely manage health related needs after discharge will improve Outcome: Progressing Goal: Individualized Educational Video(s) Outcome: Progressing   Problem: Cardiac: Goal: Ability to achieve and maintain adequate cardiopulmonary perfusion will improve Outcome: Progressing

## 2023-02-07 NOTE — Progress Notes (Cosign Needed)
Patient Name: Kimberly Hobbs Date of Encounter: 02/07/2023  Primary Cardiologist: Thomasene Ripple, DO Electrophysiologist: Regan Lemming, MD  Interval Summary   Pt reports she slept about 3 hours last night. INR 2 this am.   Episodes of VT beginning around 0755  Vital Signs    Vitals:   02/07/23 0200 02/07/23 0300 02/07/23 0400 02/07/23 0500  BP: (!) 119/57 (!) 130/56 (!) 110/59 (!) 104/54  Pulse: (!) 50 (!) 50 (!) 50 (!) 48  Resp: (!) 21 (!) 23 19 20   Temp:   98.1 F (36.7 C)   TempSrc:   Oral   SpO2: 95% 95% 93% 93%  Weight:      Height:        Intake/Output Summary (Last 24 hours) at 02/07/2023 0826 Last data filed at 02/07/2023 0400 Gross per 24 hour  Intake 1377.29 ml  Output 0 ml  Net 1377.29 ml   Filed Weights   02/03/23 1530 02/04/23 0600 02/06/23 0600  Weight: 94.6 kg 93.2 kg 92.6 kg    Physical Exam    GEN- The patient is well appearing, alert and oriented x 3 today.   Lungs- Clear to ausculation bilaterally, normal work of breathing Cardiac-  regular  rate and rhythm, no murmurs, rubs or gallops GI- soft, NT, ND, + BS Extremities- no clubbing or cyanosis. No edema  Telemetry    SB 40-50's, PVC's, no VT (personally reviewed)  Studies: ECHO 02/03/23 > LVEF 55-60%, no RWMA, RV systolic function mildly reduced, LA mildly dilated, AV/MV replacement functioning normally  cMRI 02/05/23 > LVEF 40%, RV insertion site LGE (non-specific scar finding in setting of elevated pulmonary pressures), dilated main pulmonary artery   Hospital Course    Kimberly Hobbs is a 62 y.o. female with PMH  AF, CAD s/p CABG rheumatic heart disease s/p mechanical MVR / AVR, CKD, DM, HLD admitted for WCT.  S/p DCCV in ER. Troponin, BNP negative. LHC deferred. Recurrent VT on 12/17 s/p DCCV x2. For EPS 12/17 but unable to induce VT. cMRI with LVEF 40%, RV insertion site LGE (non-specific).   Assessment & Plan    Wide Complex Tachycardia  No underlying flutter waves  seen with adenosine 12mg . S/p DCCV x1 at 200j with anterior manual pressure.  NSR restored.  Recurrent VT during admit requiring second DCCV 12/17. Unable to induce VT on EPS. Baseline LVEF 55% 2020.  -amiodarone 60mg /hr -continue Toprol 100mg  BID  -not a VT ablation candidate due to mechanical MVR/AVR  -pending ICD implant  -follow up troponin, EKG without acute ST changes   AF/AFL Hx AF ablation 09/05/22, CHA2DS2-VASc 5. Recurrent AFL, metoprolol was increased on 01/20/23 -Toprol as above  -stop heparin for pending ICD implant, off 0750 -continue coumadin for stroke prophylaxis   CAD  Rheumatic Valve Disease s/p Mechanical MVR/AVR 2018 Secondary Hypercoagulable State  -warfarin per pharmacy   -continue imdur -spiro on hold   -crestor    DM  SSI -hold home metformin while inpatient    OSA  -CPAP ordered, pt can not wear face mask   For questions or updates, please contact [facility] Please consult [email or web address] for contact info under Cardiology/STEMI.  Signed, Canary Brim, MSN, APRN, NP-C, AGACNP-BC [name] - Electrophysiology  02/07/2023, 8:37 AM  I have seen and examined this patient with Canary Brim.  Agree with above, note added to reflect my findings.  Patient with continued episodes of ventricular tachycardia.  Symptomatic with palpitations and lightheadedness.    GEN:  Well nourished, well developed, in no acute distress  HEENT: normal  Neck: no JVD, carotid bruits, or masses Cardiac: Irregular; no murmurs, rubs, or gallops,no edema  Respiratory:  clear to auscultation bilaterally, normal work of breathing GI: soft, nontender, nondistended, + BS MS: no deformity or atrophy  Skin: warm and dry Neuro:  Strength and sensation are intact Psych: euthymic mood, full affect   Ventricular tachycardia: Continued episodes.  Will continue IV amiodarone at 60 mg an hour. Persistent atrial fibrillation/flutter: Remains in atrial fibrillation.  Will plan  for cardioversion once full amiodarone load has occurred. Coronary artery disease: No current chest pain Valvular heart disease: Post mechanical AVR/MVR.  Continue warfarin Diabetes: Sliding scale insulin OSA: Will need sleep medicine as an outpatient  Will [name] 02/08/2023 8:15 AM

## 2023-02-07 NOTE — Progress Notes (Signed)
PHARMACY - ANTICOAGULATION CONSULT NOTE  Pharmacy Consult for heparin, warfarin Indication: atrial fibrillation and mechanical MVR and mechanical AVR  Allergies  Allergen Reactions   Duloxetine Hcl Other (See Comments)    Behavioral changes, sleep disturbances   Iodinated Contrast Media Other (See Comments)    Patient is unsure of reaction type   Penicillins Other (See Comments)    Immune to drug , does not work per patient     Patient Measurements: Height: 5\' 5"  (165.1 cm) Weight: 92.6 kg (204 lb 2.3 oz) IBW/kg (Calculated) : 57 Heparin Dosing Weight: 78.3 kg  Vital Signs: Temp: 98.1 F (36.7 C) (12/20 0400) Temp Source: Oral (12/20 0400) BP: 124/77 (12/20 1000) Pulse Rate: 98 (12/20 1000)  Labs: Recent Labs    02/04/23 2232 02/05/23 0824 02/05/23 0824 02/06/23 0234 02/07/23 0230 02/07/23 0848  HGB  --  11.7*   < > 10.3* 9.4*  --   HCT  --  36.0  --  32.7* 29.4*  --   PLT  --  254  --  229 213  --   LABPROT  --   --   --  21.3* 22.6*  --   INR  --   --   --  1.8* 2.0*  --   HEPARINUNFRC  --  0.50  --  0.54 0.42  --   CREATININE 1.66* 1.30*  --   --   --  1.25*  TROPONINIHS  --   --   --   --   --  5   < > = values in this interval not displayed.    Estimated Creatinine Clearance: 53.1 mL/min (A) (by C-G formula based on SCr of 1.25 mg/dL (H)).   Medical History: Past Medical History:  Diagnosis Date   Allergy ?   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   COPD (chronic obstructive pulmonary disease) (HCC)    COVID-19    GERD (gastroesophageal reflux disease)    H/O mitral valve replacement with mechanical #25 mechanical SJM 01/01/2017) 01/01/2017   Hyperlipidemia    Hypertension    Pain in both lower extremities 07/23/2019   Paroxysmal atrial fibrillation (HCC) 06/29/2015   Rheumatic heart disease    MS/ AS   Sleep apnea    Status post mechanical 19 mm mechanical  regent AV replacement 01/01/2017 01/01/2017    Medications:  Medications Prior to Admission  Medication Sig Dispense Refill Last Dose/Taking   albuterol (VENTOLIN HFA) 108 (90 Base) MCG/ACT inhaler Inhale 1-2 puffs into the lungs every 6 (six) hours as needed for wheezing or shortness of breath.   Past Month   aspirin EC 81 MG tablet Take 81 mg by mouth daily.    02/03/2023   clonazePAM (KLONOPIN) 0.5 MG tablet Take 1.5 tablets (0.75 mg total) by mouth at bedtime. 45 tablet 0 02/02/2023   cyclobenzaprine (FLEXERIL) 10 MG tablet Take 1 tablet (10 mg total) by mouth at bedtime as needed for muscle spasms. 30 tablet 3 Taking As Needed   diclofenac Sodium (VOLTAREN ARTHRITIS PAIN) 1 % GEL Apply 4 g topically 4 (four) times daily. (Patient taking differently: Apply 4 g topically 4 (four) times daily as needed (pain).) 150 g 5 Taking Differently   diltiazem (CARDIZEM) 30 MG tablet Take 1 tablet every 4 hours AS NEEDED for AFIB heart rate >100 as long as top BP >100. 30  tablet 2 02/03/2023   furosemide (LASIX) 20 MG tablet Take 1 tablet (20 mg total) by mouth 2 (two) times daily. 60 tablet 3 02/03/2023   gabapentin (NEURONTIN) 100 MG capsule Take 1 capsule (100 mg total) by mouth at bedtime. 30 capsule 5 02/02/2023   HYDROcodone-acetaminophen (NORCO) 7.5-325 MG tablet Take 1 tablet by mouth every 12 (twelve) hours as needed for moderate pain (pain score 4-6). 60 tablet 0 02/02/2023   isosorbide mononitrate (IMDUR) 60 MG 24 hr tablet Take 1 tablet (60 mg total) by mouth daily. 90 tablet 3 02/03/2023   losartan (COZAAR) 50 MG tablet HOLD while on higher dose of metoprolol 12/11   Taking   metFORMIN (GLUCOPHAGE) 500 MG tablet Take 1 tablet (500 mg total) by mouth 2 (two) times daily with a meal. 180 tablet 3 02/03/2023   metoprolol succinate (TOPROL XL) 50 MG 24 hr tablet Take 1 tablet (50 mg total) by mouth 2 (two) times daily. 60 tablet 3 02/03/2023   montelukast (SINGULAIR) 10 MG tablet Take 1 tablet (10  mg total) by mouth daily. 30 tablet 11 02/02/2023   nitroGLYCERIN (NITROSTAT) 0.4 MG SL tablet DISSOLVE 1 TABLET UNDER THE TONGUE EVERY 5 MINUTES FOR 3 DOSES AS NEEDED FOR CHEST PAIN (Patient taking differently: Place 0.4 mg under the tongue every 5 (five) minutes as needed for chest pain.) 25 tablet 3 Taking Differently   ondansetron (ZOFRAN) 8 MG tablet Take 1 tablet (8 mg total) by mouth every 8 (eight) hours as needed for nausea or vomiting. 20 tablet 0 Taking As Needed   potassium chloride SA (KLOR-CON M) 20 MEQ tablet Take 1 tablet (20 mEq total) by mouth 2 (two) times daily. (Patient taking differently: Take 40 mEq by mouth daily.) 180 tablet 3 02/03/2023   rosuvastatin (CRESTOR) 40 MG tablet Take 1 tablet (40 mg total) by mouth daily. 90 tablet 1 02/02/2023   spironolactone (ALDACTONE) 50 MG tablet Take 1 tablet (50 mg total) by mouth daily. 90 tablet 3 02/03/2023   Tiotropium Bromide-Olodaterol (STIOLTO RESPIMAT) 2.5-2.5 MCG/ACT AERS Inhale 2 puffs into the lungs daily. 4 g 6 02/03/2023   warfarin (COUMADIN) 5 MG tablet Take 7.5 mg on Monday and Wed and 5 mg all other days (Patient taking differently: Take 5 mg by mouth See admin instructions. Take 5mg  (one tablet) once daily on Tuesdays, Thursdays, Saturdays, and Sundays. To be taken in addition to warfarin 7.5mg  once daily on Mondays, Wednesdays, and Fridays.) 90 tablet 3 02/02/2023 at  8:00 PM   Accu-Chek Softclix Lancets lancets Use as instructed (Patient taking differently: 1 each by Other route See admin instructions. Use as instructed) 100 each 12    Blood Glucose Monitoring Suppl (ACCU-CHEK GUIDE) w/Device KIT Check glucose every morning (Patient taking differently: 1 each by Other route See admin instructions. Check glucose every morning) 1 kit 0    dapagliflozin propanediol (FARXIGA) 5 MG TABS tablet Take 5 mg by mouth daily.      glucose blood (ACCU-CHEK GUIDE) test strip Use as instructed (Patient taking differently: 1 each by Other  route as needed for other (Glucose chesk). Use as instructed) 100 each 12    vortioxetine HBr (TRINTELLIX) 5 MG TABS tablet Take 1 tablet (5 mg total) by mouth at bedtime. (Patient not taking: Reported on 02/03/2023) 30 tablet 2 Not Taking    Assessment: 62 yo F presents with wide-complex tachycardia now in NSR s/p DCCV x1 shock. Patient has a history of rheumatic heart disease and is s/p  mechanical MVR and mechanical AVR in 2018 and Afib anticoagulated with warfarin with goal INR of 2.5-3.5 PTA. Patient is being admitted for VT work-up per cardiology recs and pharmacy has been consulted to bridge heparin while off of warfarin.   She is s/p EP study 12/17 and heparin was restarted and warfarin was restarted 12/18 -heparin level= 0.42 on 1500 units/hr -INR 2.0; hg 9.4 with slow trend down -she in noted on amiodarone which will increase warfarin sensitivity -plans noted for PPM today  PTA warfarin dose: 5mg /d except take 7.5mg  on MWF. Last clinic visit 12/10 w/ INR 3.2     Goal of Therapy:  Heparin level 0.3-0.7 units/ml INR 2.5-3.5 Monitor platelets by anticoagulation protocol: Yes   Plan:  -Continue heparin 1500 units/hr  -Daily heparin level and CBC -Warfarin 7.5mg  po today -Daily INR  Harland German, PharmD Clinical Pharmacist **Pharmacist phone directory can now be found on [email or web address] ([email or web address]).  Listed under [facility].   Harland German, PharmD Clinical Pharmacist **Pharmacist phone directory can now be found on [email or web address] ([email or web address]).  Listed under [facility].

## 2023-02-07 NOTE — Progress Notes (Signed)
PHARMACY - ANTICOAGULATION CONSULT NOTE  Pharmacy Consult for heparin, warfarin Indication: atrial fibrillation and mechanical MVR and mechanical AVR  Allergies  Allergen Reactions   Duloxetine Hcl Other (See Comments)    Behavioral changes, sleep disturbances   Iodinated Contrast Media Other (See Comments)    Patient is unsure of reaction type   Penicillins Other (See Comments)    Immune to drug , does not work per patient     Patient Measurements: Height: 5\' 5"  (165.1 cm) Weight: 92.6 kg (204 lb 2.3 oz) IBW/kg (Calculated) : 57 Heparin Dosing Weight: 78.3 kg  Vital Signs: Temp: 98 F (36.7 C) (12/20 1600) Temp Source: Oral (12/20 1600) BP: 131/102 (12/20 1600) Pulse Rate: 95 (12/20 1600)  Labs: Recent Labs    02/04/23 2232 02/05/23 0824 02/05/23 0824 02/06/23 0234 02/07/23 0230 02/07/23 0848 02/07/23 1009  HGB  --  11.7*   < > 10.3* 9.4*  --   --   HCT  --  36.0  --  32.7* 29.4*  --   --   PLT  --  254  --  229 213  --   --   LABPROT  --   --   --  21.3* 22.6*  --   --   INR  --   --   --  1.8* 2.0*  --   --   HEPARINUNFRC  --  0.50  --  0.54 0.42  --   --   CREATININE 1.66* 1.30*  --   --   --  1.25*  --   TROPONINIHS  --   --   --   --   --  5 5   < > = values in this interval not displayed.    Estimated Creatinine Clearance: 53.1 mL/min (A) (by C-G formula based on SCr of 1.25 mg/dL (H)).    Assessment: 62 yo F presents with wide-complex tachycardia now in NSR s/p DCCV x1 shock. Patient has a history of rheumatic heart disease and is s/p mechanical MVR and mechanical AVR in 2018 and Afib anticoagulated with warfarin with goal INR of 2.5-3.5 PTA. Patient is being admitted for VT work-up per cardiology recs and pharmacy has been consulted to bridge heparin while off of warfarin.   She is s/p EP study 12/17 and heparin was restarted and warfarin was restarted 12/18 -heparin level= 0.42 on 1500 units/hr -INR 2.0; hg 9.4 with slow trend down -she in noted  on amiodarone which will increase warfarin sensitivity  PTA warfarin dose: 5mg /d except take 7.5mg  on MWF. Last clinic visit 12/10 w/ INR 3.2    **Pacemaker implant has been cancelled for today - trying to reschedule for Monday** Per EP PA, try to keep INR ~2 and restart heparin. Heparin was held since 0800 12/20 in anticipation of surgery today.    Goal of Therapy:  Heparin level 0.3-0.7 units/ml INR 2.5-3.5 Monitor platelets by anticoagulation protocol: Yes   Plan:  -Restart heparin 1500 units/hr  -Will f/u daily heparin level and CBC -Give warfarin 2.5mg  today -Daily INR  Christoper Fabian, PharmD, BCPS Please see [email or web address] for complete clinical pharmacist phone list 02/07/2023 4:43 PM

## 2023-02-08 DIAGNOSIS — I472 Ventricular tachycardia, unspecified: Secondary | ICD-10-CM | POA: Diagnosis not present

## 2023-02-08 LAB — GLUCOSE, CAPILLARY
Glucose-Capillary: 119 mg/dL — ABNORMAL HIGH (ref 70–99)
Glucose-Capillary: 250 mg/dL — ABNORMAL HIGH (ref 70–99)
Glucose-Capillary: 285 mg/dL — ABNORMAL HIGH (ref 70–99)
Glucose-Capillary: 102 mg/dL — ABNORMAL HIGH (ref 70–99)
Glucose-Capillary: 127 mg/dL — ABNORMAL HIGH (ref 70–99)

## 2023-02-08 LAB — BASIC METABOLIC PANEL
Anion gap: 8 (ref 5–15)
BUN: 22 mg/dL (ref 8–23)
CO2: 21 mmol/L — ABNORMAL LOW (ref 22–32)
Calcium: 8.7 mg/dL — ABNORMAL LOW (ref 8.9–10.3)
Chloride: 109 mmol/L (ref 98–111)
Creatinine, Ser: 1.12 mg/dL — ABNORMAL HIGH (ref 0.44–1.00)
GFR, Estimated: 56 mL/min — ABNORMAL LOW (ref >60)
Glucose, Bld: 132 mg/dL — ABNORMAL HIGH (ref 70–99)
Potassium: 3.9 mmol/L (ref 3.5–5.1)
Sodium: 138 mmol/L (ref 135–145)

## 2023-02-08 LAB — CBC
HCT: 34.6 % — ABNORMAL LOW (ref 36.0–46.0)
Hemoglobin: 11.1 g/dL — ABNORMAL LOW (ref 12.0–15.0)
MCH: 29.4 pg (ref 26.0–34.0)
MCHC: 32.1 g/dL (ref 30.0–36.0)
MCV: 91.8 fL (ref 80.0–100.0)
Platelets: 243 10*3/uL (ref 150–400)
RBC: 3.77 MIL/uL — ABNORMAL LOW (ref 3.87–5.11)
RDW: 15.7 % — ABNORMAL HIGH (ref 11.5–15.5)
WBC: 8.9 10*3/uL (ref 4.0–10.5)
nRBC: 0 % (ref 0.0–0.2)

## 2023-02-08 LAB — PROTIME-INR
INR: 2 — ABNORMAL HIGH (ref 0.8–1.2)
Prothrombin Time: 22.8 s — ABNORMAL HIGH (ref 11.4–15.2)

## 2023-02-08 LAB — HEPARIN LEVEL (UNFRACTIONATED)
Heparin Unfractionated: 0.32 [IU]/mL (ref 0.30–0.70)
Heparin Unfractionated: 0.41 [IU]/mL (ref 0.30–0.70)

## 2023-02-08 LAB — MAGNESIUM: Magnesium: 2.4 mg/dL (ref 1.7–2.4)

## 2023-02-08 MED ORDER — AMIODARONE HCL IN DEXTROSE 360-4.14 MG/200ML-% IV SOLN
30.0000 mg/h | INTRAVENOUS | Status: DC
  Administered 2023-02-08 – 2023-02-10 (×5): 30 mg/h via INTRAVENOUS
  Filled 2023-02-08 (×5): qty 200

## 2023-02-08 MED ORDER — AMIODARONE HCL IN DEXTROSE 360-4.14 MG/200ML-% IV SOLN: 60.0000 mg/h | INTRAVENOUS | Status: DC

## 2023-02-08 MED ORDER — WARFARIN SODIUM 2.5 MG PO TABS
2.5000 mg | ORAL_TABLET | Freq: Once | ORAL | Status: AC
  Administered 2023-02-08: 2.5 mg via ORAL
  Filled 2023-02-08: qty 1

## 2023-02-08 NOTE — Progress Notes (Signed)
PHARMACY - ANTICOAGULATION CONSULT NOTE  Pharmacy Consult for heparin, warfarin Indication: atrial fibrillation and mechanical MVR and mechanical AVR  Allergies  Allergen Reactions   Duloxetine Hcl Other (See Comments)    Behavioral changes, sleep disturbances   Iodinated Contrast Media Other (See Comments)    Patient is unsure of reaction type   Penicillins Other (See Comments)    Immune to drug , does not work per patient     Patient Measurements: Height: 5\' 5"  (165.1 cm) Weight: 92.6 kg (204 lb 2.3 oz) IBW/kg (Calculated) : 57 Heparin Dosing Weight: 78.3 kg  Vital Signs: Temp: 98.1 F (36.7 C) (12/21 1143) Temp Source: Oral (12/21 1143) BP: 90/50 (12/21 1100) Pulse Rate: 86 (12/21 1100)  Labs: Recent Labs    02/06/23 0234 02/07/23 0230 02/07/23 0848 02/07/23 1009 02/08/23 0252  HGB 10.3* 9.4*  --   --  11.1*  HCT 32.7* 29.4*  --   --  34.6*  PLT 229 213  --   --  243  LABPROT 21.3* 22.6*  --   --  22.8*  INR 1.8* 2.0*  --   --  2.0*  HEPARINUNFRC 0.54 0.42  --   --  0.32  CREATININE  --   --  1.25*  --  1.12*  TROPONINIHS  --   --  5 5  --     Estimated Creatinine Clearance: 59.3 mL/min (A) (by C-G formula based on SCr of 1.12 mg/dL (H)).   Medical History: Past Medical History:  Diagnosis Date   Allergy ?   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   Past Month   aspirin EC 81 MG tablet Take 81 mg by mouth daily.    02/03/2023   clonazePAM (KLONOPIN) 0.5 MG tablet Take 1.5 tablets (0.75 mg total) by mouth at bedtime. 45 tablet 0 02/02/2023   cyclobenzaprine (FLEXERIL) 10 MG tablet Take 1 tablet (10 mg total) by mouth at bedtime as needed for muscle spasms. 30 tablet 3 Taking As Needed   diclofenac Sodium (VOLTAREN ARTHRITIS PAIN) 1 % GEL Apply 4 g topically 4 (four) times daily. (Patient taking differently: Apply 4 g topically 4 (four) times daily as needed (pain).) 150 g 5 Taking Differently   diltiazem (CARDIZEM) 30 MG tablet Take 1 tablet every 4 hours AS NEEDED for AFIB heart rate >100 as long as top BP >100. 30 tablet 2 02/03/2023   furosemide (LASIX) 20 MG tablet Take 1 tablet (20 mg total) by mouth 2 (two) times daily. 60 tablet 3 02/03/2023   gabapentin (NEURONTIN) 100 MG capsule Take 1 capsule (100 mg total) by mouth at  bedtime. 30 capsule 5 02/02/2023   HYDROcodone-acetaminophen (NORCO) 7.5-325 MG tablet Take 1 tablet by mouth every 12 (twelve) hours as needed for moderate pain (pain score 4-6). 60 tablet 0 02/02/2023   isosorbide mononitrate (IMDUR) 60 MG 24 hr tablet Take 1 tablet (60 mg total) by mouth daily. 90 tablet 3 02/03/2023   losartan (COZAAR) 50 MG tablet HOLD while on higher dose of metoprolol 12/11   Taking   metFORMIN (GLUCOPHAGE) 500 MG tablet Take 1 tablet (500 mg total) by mouth 2 (two) times daily with a meal. 180 tablet 3 02/03/2023   metoprolol succinate (TOPROL XL) 50 MG 24 hr tablet Take 1 tablet (50 mg total) by mouth 2 (two) times daily. 60 tablet 3 02/03/2023   montelukast (SINGULAIR) 10 MG tablet Take 1 tablet (10 mg total) by mouth daily. 30 tablet 11 02/02/2023   nitroGLYCERIN (NITROSTAT) 0.4 MG SL tablet DISSOLVE 1  TABLET UNDER THE TONGUE EVERY 5 MINUTES FOR 3 DOSES AS NEEDED FOR CHEST PAIN (Patient taking differently: Place 0.4 mg under the tongue every 5 (five) minutes as needed for chest pain.) 25 tablet 3 Taking Differently   ondansetron (ZOFRAN) 8 MG tablet Take 1 tablet (8 mg total) by mouth every 8 (eight) hours as needed for nausea or vomiting. 20 tablet 0 Taking As Needed   potassium chloride SA (KLOR-CON M) 20 MEQ tablet Take 1 tablet (20 mEq total) by mouth 2 (two) times daily. (Patient taking differently: Take 40 mEq by mouth daily.) 180 tablet 3 02/03/2023   rosuvastatin (CRESTOR) 40 MG tablet Take 1 tablet (40 mg total) by mouth daily. 90 tablet 1 02/02/2023   spironolactone (ALDACTONE) 50 MG tablet Take 1 tablet (50 mg total) by mouth daily. 90 tablet 3 02/03/2023   Tiotropium Bromide-Olodaterol (STIOLTO RESPIMAT) 2.5-2.5 MCG/ACT AERS Inhale 2 puffs into the lungs daily. 4 g 6 02/03/2023   warfarin (COUMADIN) 5 MG tablet Take 7.5 mg on Monday and Wed and 5 mg all other days (Patient taking differently: Take 5 mg by mouth See admin instructions. Take 5mg  (one tablet) once daily on Tuesdays, Thursdays, Saturdays, and Sundays. To be taken in addition to warfarin 7.5mg  once daily on Mondays, Wednesdays, and Fridays.) 90 tablet 3 02/02/2023 at  8:00 PM   Accu-Chek Softclix Lancets lancets Use as instructed (Patient taking differently: 1 each by Other route See admin instructions. Use as instructed) 100 each 12    Blood Glucose Monitoring Suppl (ACCU-CHEK GUIDE) w/Device KIT Check glucose every morning (Patient taking differently: 1 each by Other route See admin instructions. Check glucose every morning) 1 kit 0    dapagliflozin propanediol (FARXIGA) 5 MG TABS tablet Take 5 mg by mouth daily.      glucose blood (ACCU-CHEK GUIDE) test strip Use as instructed (Patient taking differently: 1 each by Other route as needed for other (Glucose chesk). Use as instructed) 100 each 12    vortioxetine HBr (TRINTELLIX)  5 MG TABS tablet Take 1 tablet (5 mg total) by mouth at bedtime. (Patient not taking: Reported on 02/03/2023) 30 tablet 2 Not Taking    Assessment: 62 yo F presents with wide-complex tachycardia now in NSR s/p DCCV x1 shock. Patient has a history of rheumatic heart disease and is s/p mechanical MVR and mechanical AVR in 2018 and Afib anticoagulated with warfarin with goal INR of 2.5-3.5 PTA. Patient is being admitted for VT work-up per cardiology recs and pharmacy has been consulted to bridge heparin while off of warfarin.  PTA warfarin dose: 5mg /d except take 7.5mg  on MWF. Last clinic visit 12/10 w/ INR 3.2    She is s/p EP study 12/17 and heparin was restarted and warfarin was restarted 12/18. Patient was initially scheduled for PM implant on 12/20 and this was delayed and now likely to take place next week.  Heparin level is on the lower end of therapeutic range at 0.32 on UFH IV 1500 units/hour. INR is subtherapetuic at 2.0 on warfarin dosed daily: 12/18 7.5 mg, 12/19 7.5 mg, 12/20 2.5 mg. No signs of bleeding. Hgb up from 9.4 to 11.1. Plt stable.  Goal of Therapy:  Heparin level 0.3-0.7 units/ml INR pre-procedure goal: 1.8-2.2 Monitor platelets by anticoagulation protocol: Yes   Plan:  -Give warfarin 2.5 mg x1 -Increase UFH IV to 1550 units/hr  -Check 6-hour heparin level -Daily INR, heparin level, and CBC  Wilmer Floor, PharmD PGY2 Cardiology Pharmacy Resident

## 2023-02-08 NOTE — Progress Notes (Signed)
  Patient Name: Kimberly Hobbs Date of Encounter: 02/08/2023  Primary Cardiologist: Thomasene Ripple, DO Electrophysiologist: Regan Lemming, MD  Interval Summary   Short runs of nonsustained VT overnight, all less than 8 beats.  Patient minimally symptomatic.  Vital Signs    Vitals:   02/08/23 0500 02/08/23 0600 02/08/23 0745 02/08/23 0800  BP: 113/71 (!) 87/45  108/71  Pulse: 79 76  84  Resp: (!) 22 (!) 23  (!) 25  Temp:      TempSrc:      SpO2: 96% 93% 99% 97%  Weight:      Height:        Intake/Output Summary (Last 24 hours) at 02/08/2023 0817 Last data filed at 02/08/2023 0800 Gross per 24 hour  Intake 1680.07 ml  Output 1475 ml  Net 205.07 ml   Filed Weights   02/03/23 1530 02/04/23 0600 02/06/23 0600  Weight: 94.6 kg 93.2 kg 92.6 kg    Physical Exam    GEN: No acute distress.   Neck: No JVD Cardiac: Irregular, no murmurs, rubs, or gallops.  Respiratory: decreased BS bases bilaterally. GI: Soft, nontender, non-distended  MS: No edema; No deformity. Neuro:  Nonfocal  Skin: warm and dry Psych: Normal affect    Telemetry    Atrial fibrillation, nonsustained VT-personally reviewed  Hospital Course    Kimberly Hobbs is a 62 y.o. female with PMH  AF, CAD s/p CABG rheumatic heart disease s/p mechanical MVR / AVR, CKD, DM, HLD admitted for WCT.  S/p DCCV in ER. Troponin, BNP negative. LHC deferred. Recurrent VT on 12/17 s/p DCCV x2. For EPS 12/17 but unable to induce VT. cMRI with LVEF 40%, RV insertion site LGE (non-specific).   Assessment & Plan    1.  Ventricular tachycardia: Has continued episodes of VT.  Fortunately none that were prolonged over the last 24 hours.  Continue amiodarone.  Kimberly Hobbs reduce dose to 30 mg/h.  If she remains stable through the day, may transfer to the floor this afternoon.  2 persistent atrial fibrillation/flutter: Post ablation x 2.  Continued episodes.  Plan for cardioversion after full amiodarone load.  3.  Coronary  artery disease: No current chest pain.  4.  Valvular heart disease: Post mechanical MVR/AVR.  Stable on most recent echo.  Continue warfarin.  5. Diabetes: Continue sliding scale insulin  For questions or updates, please contact [facility] Please consult [email or web address] for contact info under Cardiology/STEMI.   Jaxtyn Linville M. Mckenzie Bove MD 02/08/2023 8:17 AM

## 2023-02-08 NOTE — Progress Notes (Signed)
PHARMACY - ANTICOAGULATION CONSULT NOTE  Pharmacy Consult for heparin, warfarin Indication: atrial fibrillation and mechanical MVR and mechanical AVR  Allergies  Allergen Reactions   Duloxetine Hcl Other (See Comments)    Behavioral changes, sleep disturbances   Iodinated Contrast Media Other (See Comments)    Patient is unsure of reaction type   Penicillins Other (See Comments)    Immune to drug , does not work per patient     Patient Measurements: Height: 5\' 5"  (165.1 cm) Weight: 92.6 kg (204 lb 2.3 oz) IBW/kg (Calculated) : 57 Heparin Dosing Weight: 78.3 kg  Vital Signs: Temp: 98.1 F (36.7 C) (12/21 1143) Temp Source: Oral (12/21 1143) BP: 99/65 (12/21 1402) Pulse Rate: 85 (12/21 1402)  Labs: Recent Labs    02/06/23 0234 02/07/23 0230 02/07/23 0848 02/07/23 1009 02/08/23 0252 02/08/23 1324  HGB 10.3* 9.4*  --   --  11.1*  --   HCT 32.7* 29.4*  --   --  34.6*  --   PLT 229 213  --   --  243  --   LABPROT 21.3* 22.6*  --   --  22.8*  --   INR 1.8* 2.0*  --   --  2.0*  --   HEPARINUNFRC 0.54 0.42  --   --  0.32 0.41  CREATININE  --   --  1.25*  --  1.12*  --   TROPONINIHS  --   --  5 5  --   --     Estimated Creatinine Clearance: 59.3 mL/min (A) (by C-G formula based on SCr of 1.12 mg/dL (H)).   Medical History: Past Medical History:  Diagnosis Date   Allergy ?   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   Past Month   aspirin EC 81 MG tablet Take 81 mg by mouth daily.    02/03/2023   clonazePAM (KLONOPIN) 0.5 MG tablet Take 1.5 tablets (0.75 mg total) by mouth at bedtime. 45 tablet 0 02/02/2023   cyclobenzaprine (FLEXERIL) 10 MG tablet Take 1 tablet (10 mg total) by mouth at bedtime as needed for muscle spasms. 30 tablet 3 Taking As Needed   diclofenac Sodium (VOLTAREN ARTHRITIS PAIN) 1 % GEL Apply 4 g topically 4 (four) times daily. (Patient taking differently: Apply 4 g topically 4 (four) times daily as needed (pain).) 150 g 5 Taking Differently   diltiazem (CARDIZEM) 30 MG tablet Take 1 tablet every 4 hours AS NEEDED for AFIB heart rate >100 as long as top BP >100. 30 tablet 2 02/03/2023   furosemide (LASIX) 20 MG tablet Take 1 tablet (20 mg total) by mouth  2 (two) times daily. 60 tablet 3 02/03/2023   gabapentin (NEURONTIN) 100 MG capsule Take 1 capsule (100 mg total) by mouth at bedtime. 30 capsule 5 02/02/2023   HYDROcodone-acetaminophen (NORCO) 7.5-325 MG tablet Take 1 tablet by mouth every 12 (twelve) hours as needed for moderate pain (pain score 4-6). 60 tablet 0 02/02/2023   isosorbide mononitrate (IMDUR) 60 MG 24 hr tablet Take 1 tablet (60 mg total) by mouth daily. 90 tablet 3 02/03/2023   losartan (COZAAR) 50 MG tablet HOLD while on higher dose of metoprolol 12/11   Taking   metFORMIN (GLUCOPHAGE) 500 MG tablet Take 1 tablet (500 mg total) by mouth 2 (two) times daily with a meal. 180 tablet 3 02/03/2023   metoprolol succinate (TOPROL XL) 50 MG 24 hr tablet Take 1 tablet (50 mg total) by mouth 2 (two) times daily. 60 tablet 3 02/03/2023   montelukast (SINGULAIR) 10 MG tablet Take 1 tablet (10 mg total) by mouth daily. 30 tablet 11  02/02/2023   nitroGLYCERIN (NITROSTAT) 0.4 MG SL tablet DISSOLVE 1 TABLET UNDER THE TONGUE EVERY 5 MINUTES FOR 3 DOSES AS NEEDED FOR CHEST PAIN (Patient taking differently: Place 0.4 mg under the tongue every 5 (five) minutes as needed for chest pain.) 25 tablet 3 Taking Differently   ondansetron (ZOFRAN) 8 MG tablet Take 1 tablet (8 mg total) by mouth every 8 (eight) hours as needed for nausea or vomiting. 20 tablet 0 Taking As Needed   potassium chloride SA (KLOR-CON M) 20 MEQ tablet Take 1 tablet (20 mEq total) by mouth 2 (two) times daily. (Patient taking differently: Take 40 mEq by mouth daily.) 180 tablet 3 02/03/2023   rosuvastatin (CRESTOR) 40 MG tablet Take 1 tablet (40 mg total) by mouth daily. 90 tablet 1 02/02/2023   spironolactone (ALDACTONE) 50 MG tablet Take 1 tablet (50 mg total) by mouth daily. 90 tablet 3 02/03/2023   Tiotropium Bromide-Olodaterol (STIOLTO RESPIMAT) 2.5-2.5 MCG/ACT AERS Inhale 2 puffs into the lungs daily. 4 g 6 02/03/2023   warfarin (COUMADIN) 5 MG tablet Take 7.5 mg on Monday and Wed and 5 mg all other days (Patient taking differently: Take 5 mg by mouth See admin instructions. Take 5mg  (one tablet) once daily on Tuesdays, Thursdays, Saturdays, and Sundays. To be taken in addition to warfarin 7.5mg  once daily on Mondays, Wednesdays, and Fridays.) 90 tablet 3 02/02/2023 at  8:00 PM   Accu-Chek Softclix Lancets lancets Use as instructed (Patient taking differently: 1 each by Other route See admin instructions. Use as instructed) 100 each 12    Blood Glucose Monitoring Suppl (ACCU-CHEK GUIDE) w/Device KIT Check glucose every morning (Patient taking differently: 1 each by Other route See admin instructions. Check glucose every morning) 1 kit 0    dapagliflozin propanediol (FARXIGA) 5 MG TABS tablet Take 5 mg by mouth daily.      glucose blood (ACCU-CHEK GUIDE) test strip Use as instructed (Patient taking differently: 1 each by Other route as needed for other (Glucose  chesk). Use as instructed) 100 each 12    vortioxetine HBr (TRINTELLIX) 5 MG TABS tablet Take 1 tablet (5 mg total) by mouth at bedtime. (Patient not taking: Reported on 02/03/2023) 30 tablet 2 Not Taking    Assessment: 62 yo F presents with wide-complex tachycardia now in NSR s/p DCCV x1 shock. Patient has a history of rheumatic heart disease and is s/p mechanical MVR and mechanical AVR in 2018 and Afib anticoagulated with warfarin with goal INR of 2.5-3.5 PTA.  Patient is being admitted for VT work-up per cardiology recs and pharmacy has been consulted to bridge heparin while off of warfarin.   PTA warfarin dose: 5mg /d except take 7.5mg  on MWF. Last clinic visit 12/10 w/ INR 3.2    She is s/p EP study 12/17 and heparin was restarted and warfarin was restarted 12/18. Patient was initially scheduled for PM implant on 12/20 and this was delayed and now likely to take place next week.  Heparin level is on the lower end of therapeutic range at 0.32 on UFH IV 1500 units/hour. INR is subtherapetuic at 2.0 on warfarin dosed daily: 12/18 7.5 mg, 12/19 7.5 mg, 12/20 2.5 mg. No signs of bleeding. Hgb up from 9.4 to 11.1. Plt stable.  12/21 PM update: Heparin level 0.41 therapeutic after slight rate increase to 1550 units/hr.  No issues noted.  Goal of Therapy:  Heparin level 0.3-0.7 units/ml INR pre-procedure goal: 1.8-2.2 Monitor platelets by anticoagulation protocol: Yes   Plan:  -Warfarin 2.5 mg x1 ordered -Continue UFH IV at 1550 units/hr  -Daily INR, heparin level, and CBC  Trixie Rude, PharmD Clinical Pharmacist 02/08/2023  2:14 PM

## 2023-02-08 NOTE — Progress Notes (Signed)
   02/08/23 1920  BiPAP/CPAP/SIPAP  Reason BIPAP/CPAP not in use Non-compliant    Pt. States she cannot tolerate the cpap at this time.

## 2023-02-08 NOTE — Plan of Care (Signed)
  Problem: Education: Goal: Knowledge of General Education information will improve Description: Including pain rating scale, medication(s)/side effects and non-pharmacologic comfort measures Outcome: Progressing   Problem: Health Behavior/Discharge Planning: Goal: Ability to manage health-related needs will improve Outcome: Progressing   Problem: Clinical Measurements: Goal: Ability to maintain clinical measurements within normal limits will improve Outcome: Progressing Goal: Will remain free from infection Outcome: Progressing Goal: Diagnostic test results will improve Outcome: Progressing Goal: Respiratory complications will improve Outcome: Progressing Goal: Cardiovascular complication will be avoided Outcome: Progressing   Problem: Activity: Goal: Risk for activity intolerance will decrease Outcome: Progressing   Problem: Nutrition: Goal: Adequate nutrition will be maintained Outcome: Progressing   Problem: Coping: Goal: Level of anxiety will decrease Outcome: Progressing   Problem: Elimination: Goal: Will not experience complications related to bowel motility Outcome: Progressing Goal: Will not experience complications related to urinary retention Outcome: Progressing   Problem: Pain Management: Goal: General experience of comfort will improve Outcome: Progressing   Problem: Safety: Goal: Ability to remain free from injury will improve Outcome: Progressing   Problem: Skin Integrity: Goal: Risk for impaired skin integrity will decrease Outcome: Progressing   Problem: Education: Goal: Ability to describe self-care measures that may prevent or decrease complications (Diabetes Survival Skills Education) will improve Outcome: Progressing Goal: Individualized Educational Video(s) Outcome: Progressing   Problem: Coping: Goal: Ability to adjust to condition or change in health will improve Outcome: Progressing   Problem: Fluid Volume: Goal: Ability to  maintain a balanced intake and output will improve Outcome: Progressing   Problem: Health Behavior/Discharge Planning: Goal: Ability to identify and utilize available resources and services will improve Outcome: Progressing Goal: Ability to manage health-related needs will improve Outcome: Progressing   Problem: Metabolic: Goal: Ability to maintain appropriate glucose levels will improve Outcome: Progressing   Problem: Nutritional: Goal: Maintenance of adequate nutrition will improve Outcome: Progressing Goal: Progress toward achieving an optimal weight will improve Outcome: Progressing   Problem: Skin Integrity: Goal: Risk for impaired skin integrity will decrease Outcome: Progressing   Problem: Tissue Perfusion: Goal: Adequacy of tissue perfusion will improve Outcome: Progressing   Problem: Education: Goal: Understanding of disease, treatment, and recovery process will improve Outcome: Progressing   Problem: Activity: Goal: Ability to return to baseline activity level will improve Outcome: Progressing   Problem: Cardiac: Goal: Ability to maintain adequate cardiovascular perfusion will improve Outcome: Progressing Goal: Vascular access site(s) Level 0-1 will be maintained Outcome: Progressing   Problem: Health Behavior/ Discharge Planning: Goal: Ability to safely manage health related needs after discharge Outcome: Progressing   Problem: Education: Goal: Knowledge of cardiac device and self-care will improve Outcome: Progressing Goal: Ability to safely manage health related needs after discharge will improve Outcome: Progressing Goal: Individualized Educational Video(s) Outcome: Progressing   Problem: Cardiac: Goal: Ability to achieve and maintain adequate cardiopulmonary perfusion will improve Outcome: Progressing

## 2023-02-09 DIAGNOSIS — I472 Ventricular tachycardia, unspecified: Secondary | ICD-10-CM | POA: Diagnosis not present

## 2023-02-09 LAB — CBC
HCT: 30.9 % — ABNORMAL LOW (ref 36.0–46.0)
Hemoglobin: 9.8 g/dL — ABNORMAL LOW (ref 12.0–15.0)
MCH: 29.3 pg (ref 26.0–34.0)
MCHC: 31.7 g/dL (ref 30.0–36.0)
MCV: 92.5 fL (ref 80.0–100.0)
Platelets: 230 10*3/uL (ref 150–400)
RBC: 3.34 MIL/uL — ABNORMAL LOW (ref 3.87–5.11)
RDW: 15.7 % — ABNORMAL HIGH (ref 11.5–15.5)
WBC: 8.8 10*3/uL (ref 4.0–10.5)
nRBC: 0.2 % (ref 0.0–0.2)

## 2023-02-09 LAB — GLUCOSE, CAPILLARY
Glucose-Capillary: 118 mg/dL — ABNORMAL HIGH (ref 70–99)
Glucose-Capillary: 106 mg/dL — ABNORMAL HIGH (ref 70–99)
Glucose-Capillary: 112 mg/dL — ABNORMAL HIGH (ref 70–99)
Glucose-Capillary: 119 mg/dL — ABNORMAL HIGH (ref 70–99)

## 2023-02-09 LAB — PROTIME-INR
INR: 1.9 — ABNORMAL HIGH (ref 0.8–1.2)
Prothrombin Time: 21.8 s — ABNORMAL HIGH (ref 11.4–15.2)

## 2023-02-09 LAB — BASIC METABOLIC PANEL
Anion gap: 9 (ref 5–15)
BUN: 24 mg/dL — ABNORMAL HIGH (ref 8–23)
CO2: 20 mmol/L — ABNORMAL LOW (ref 22–32)
Calcium: 8.6 mg/dL — ABNORMAL LOW (ref 8.9–10.3)
Chloride: 108 mmol/L (ref 98–111)
Creatinine, Ser: 1.33 mg/dL — ABNORMAL HIGH (ref 0.44–1.00)
GFR, Estimated: 46 mL/min — ABNORMAL LOW (ref >60)
Glucose, Bld: 112 mg/dL — ABNORMAL HIGH (ref 70–99)
Potassium: 4 mmol/L (ref 3.5–5.1)
Sodium: 137 mmol/L (ref 135–145)

## 2023-02-09 LAB — MAGNESIUM: Magnesium: 2.2 mg/dL (ref 1.7–2.4)

## 2023-02-09 LAB — HEPARIN LEVEL (UNFRACTIONATED): Heparin Unfractionated: 0.47 [IU]/mL (ref 0.30–0.70)

## 2023-02-09 MED ORDER — WARFARIN SODIUM 2.5 MG PO TABS
2.5000 mg | ORAL_TABLET | Freq: Once | ORAL | Status: AC
  Administered 2023-02-09: 2.5 mg via ORAL
  Filled 2023-02-09: qty 1

## 2023-02-09 MED ORDER — FUROSEMIDE 20 MG PO TABS
20.0000 mg | ORAL_TABLET | Freq: Every day | ORAL | Status: DC
  Administered 2023-02-09 – 2023-02-11 (×3): 20 mg via ORAL
  Filled 2023-02-09 (×3): qty 1

## 2023-02-09 NOTE — Progress Notes (Signed)
  Patient Name: Kimberly Hobbs Date of Encounter: 02/09/2023  Primary Cardiologist: Thomasene Ripple, DO Electrophysiologist: Regan Lemming, MD  Interval Summary   Continues to have short runs of nonsustained VT.  Did have atrial fibrillation as well.  No acute complaints.  Vital Signs    Vitals:   02/09/23 0735 02/09/23 0758 02/09/23 0800 02/09/23 0903  BP: (!) 96/45 (!) 112/54  (!) 112/54  Pulse: 70   70  Resp: 18     Temp: 98.8 F (37.1 C)     TempSrc: Oral     SpO2: 95%  96%   Weight:      Height:        Intake/Output Summary (Last 24 hours) at 02/09/2023 0926 Last data filed at 02/09/2023 0750 Gross per 24 hour  Intake 872.52 ml  Output --  Net 872.52 ml   Filed Weights   02/04/23 0600 02/06/23 0600 02/08/23 2147  Weight: 93.2 kg 92.6 kg 95.7 kg    Physical Exam    GEN: No acute distress.   Neck: No JVD Cardiac: RRR, no murmurs, rubs, or gallops.  Respiratory: decreased BS bases bilaterally. GI: Soft, nontender, non-distended  MS: No edema; No deformity. Neuro:  Nonfocal  Skin: warm and dry Psych: Normal affect    Telemetry    Sinus rhythm with episodes of nonsustained VT and atrial fibrillation-personally reviewed  Hospital Course    Kimberly Hobbs is a 62 y.o. female with PMH  AF, CAD s/p CABG rheumatic heart disease s/p mechanical MVR / AVR, CKD, DM, HLD admitted for WCT.  S/p DCCV in ER. Troponin, BNP negative. LHC deferred. Recurrent VT on 12/17 s/p DCCV x2. For EPS 12/17 but unable to induce VT. cMRI with LVEF 40%, RV insertion site LGE (non-specific).   Assessment & Plan    1.  Ventricular tachycardia: Has continued to have episodes of ventricular tachycardia.  Currently on 30 mg/h of amiodarone.  Kimberly Hobbs continue amiodarone load.  Not a candidate for ablation due to mechanical mitral and aortic valves.  2.  Persistent atrial fibrillation: Post ablation x 2.  Continue amiodarone load  3.  Coronary artery disease: No current chest  pain  4.  Valvular heart disease: Post mechanical AVR/MVR.  Continue anticoagulation  5.  Diabetes: Sliding scale insulin   For questions or updates, please contact [facility] Please consult [email or web address] for contact info under Cardiology/STEMI.   Kimberly Hobbs M. Kimberly Leer MD 02/09/2023 9:26 AM

## 2023-02-09 NOTE — Plan of Care (Signed)
  Problem: Education: Goal: Knowledge of General Education information will improve Description: Including pain rating scale, medication(s)/side effects and non-pharmacologic comfort measures Outcome: Progressing   Problem: Health Behavior/Discharge Planning: Goal: Ability to manage health-related needs will improve Outcome: Progressing   Problem: Clinical Measurements: Goal: Ability to maintain clinical measurements within normal limits will improve Outcome: Progressing Goal: Will remain free from infection Outcome: Progressing Goal: Diagnostic test results will improve Outcome: Progressing Goal: Respiratory complications will improve Outcome: Progressing Goal: Cardiovascular complication will be avoided Outcome: Progressing   Problem: Activity: Goal: Risk for activity intolerance will decrease Outcome: Progressing   Problem: Nutrition: Goal: Adequate nutrition will be maintained Outcome: Progressing   Problem: Coping: Goal: Level of anxiety will decrease Outcome: Progressing   Problem: Elimination: Goal: Will not experience complications related to bowel motility Outcome: Progressing Goal: Will not experience complications related to urinary retention Outcome: Progressing   Problem: Pain Management: Goal: General experience of comfort will improve Outcome: Progressing   Problem: Safety: Goal: Ability to remain free from injury will improve Outcome: Progressing   Problem: Skin Integrity: Goal: Risk for impaired skin integrity will decrease Outcome: Progressing   Problem: Education: Goal: Ability to describe self-care measures that may prevent or decrease complications (Diabetes Survival Skills Education) will improve Outcome: Progressing Goal: Individualized Educational Video(s) Outcome: Progressing   Problem: Coping: Goal: Ability to adjust to condition or change in health will improve Outcome: Progressing   Problem: Fluid Volume: Goal: Ability to  maintain a balanced intake and output will improve Outcome: Progressing   Problem: Health Behavior/Discharge Planning: Goal: Ability to identify and utilize available resources and services will improve Outcome: Progressing Goal: Ability to manage health-related needs will improve Outcome: Progressing   Problem: Metabolic: Goal: Ability to maintain appropriate glucose levels will improve Outcome: Progressing   Problem: Nutritional: Goal: Maintenance of adequate nutrition will improve Outcome: Progressing Goal: Progress toward achieving an optimal weight will improve Outcome: Progressing   Problem: Skin Integrity: Goal: Risk for impaired skin integrity will decrease Outcome: Progressing   Problem: Tissue Perfusion: Goal: Adequacy of tissue perfusion will improve Outcome: Progressing   Problem: Education: Goal: Understanding of disease, treatment, and recovery process will improve Outcome: Progressing   Problem: Activity: Goal: Ability to return to baseline activity level will improve Outcome: Progressing   Problem: Cardiac: Goal: Ability to maintain adequate cardiovascular perfusion will improve Outcome: Progressing Goal: Vascular access site(s) Level 0-1 will be maintained Outcome: Progressing   Problem: Health Behavior/ Discharge Planning: Goal: Ability to safely manage health related needs after discharge Outcome: Progressing   Problem: Education: Goal: Knowledge of cardiac device and self-care will improve Outcome: Progressing Goal: Ability to safely manage health related needs after discharge will improve Outcome: Progressing Goal: Individualized Educational Video(s) Outcome: Progressing   Problem: Cardiac: Goal: Ability to achieve and maintain adequate cardiopulmonary perfusion will improve Outcome: Progressing

## 2023-02-09 NOTE — Progress Notes (Signed)
PHARMACY - ANTICOAGULATION CONSULT NOTE  Pharmacy Consult for heparin, warfarin Indication: atrial fibrillation and mechanical MVR and mechanical AVR  Allergies  Allergen Reactions   Duloxetine Hcl Other (See Comments)    Behavioral changes, sleep disturbances   Iodinated Contrast Media Other (See Comments)    Patient is unsure of reaction type   Penicillins Other (See Comments)    Immune to drug , does not work per patient     Patient Measurements: Height: 5\' 5"  (165.1 cm) Weight: 95.7 kg (210 lb 14.4 oz) IBW/kg (Calculated) : 57 Heparin Dosing Weight: 78.3 kg  Vital Signs: Temp: 98.6 F (37 C) (12/22 0420) Temp Source: Oral (12/22 0420) BP: 101/54 (12/22 0420) Pulse Rate: 50 (12/22 0420)  Labs: Recent Labs    02/07/23 0230 02/07/23 0848 02/07/23 1009 02/08/23 0252 02/08/23 1324 02/09/23 0423  HGB 9.4*  --   --  11.1*  --  9.8*  HCT 29.4*  --   --  34.6*  --  30.9*  PLT 213  --   --  243  --  230  LABPROT 22.6*  --   --  22.8*  --  21.8*  INR 2.0*  --   --  2.0*  --  1.9*  HEPARINUNFRC 0.42  --   --  0.32 0.41 0.47  CREATININE  --  1.25*  --  1.12*  --  1.33*  TROPONINIHS  --  5 5  --   --   --     Estimated Creatinine Clearance: 50.8 mL/min (A) (by C-G formula based on SCr of 1.33 mg/dL (H)).   Medical History: Past Medical History:  Diagnosis Date   Allergy ?   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   Past Month   aspirin EC 81 MG tablet Take 81 mg by mouth daily.    02/03/2023   clonazePAM (KLONOPIN) 0.5 MG tablet Take 1.5 tablets (0.75 mg total) by mouth at bedtime. 45 tablet 0 02/02/2023   cyclobenzaprine (FLEXERIL) 10 MG tablet Take 1 tablet (10 mg total) by mouth at bedtime as needed for muscle spasms. 30 tablet 3 Taking As Needed   diclofenac Sodium (VOLTAREN ARTHRITIS PAIN) 1 % GEL Apply 4 g topically 4 (four) times daily. (Patient taking differently: Apply 4 g topically 4 (four) times daily as needed (pain).) 150 g 5 Taking Differently   diltiazem (CARDIZEM) 30 MG tablet Take 1 tablet every 4 hours AS NEEDED for AFIB heart rate >100 as long as top BP >100. 30 tablet 2 02/03/2023   furosemide (LASIX) 20 MG tablet Take 1 tablet (20 mg total) by mouth 2 (two)  times daily. 60 tablet 3 02/03/2023   gabapentin (NEURONTIN) 100 MG capsule Take 1 capsule (100 mg total) by mouth at bedtime. 30 capsule 5 02/02/2023   HYDROcodone-acetaminophen (NORCO) 7.5-325 MG tablet Take 1 tablet by mouth every 12 (twelve) hours as needed for moderate pain (pain score 4-6). 60 tablet 0 02/02/2023   isosorbide mononitrate (IMDUR) 60 MG 24 hr tablet Take 1 tablet (60 mg total) by mouth daily. 90 tablet 3 02/03/2023   losartan (COZAAR) 50 MG tablet HOLD while on higher dose of metoprolol 12/11   Taking   metFORMIN (GLUCOPHAGE) 500 MG tablet Take 1 tablet (500 mg total) by mouth 2 (two) times daily with a meal. 180 tablet 3 02/03/2023   metoprolol succinate (TOPROL XL) 50 MG 24 hr tablet Take 1 tablet (50 mg total) by mouth 2 (two) times daily. 60 tablet 3 02/03/2023   montelukast (SINGULAIR) 10 MG tablet Take 1 tablet (10 mg total) by mouth daily. 30 tablet 11  02/02/2023   nitroGLYCERIN (NITROSTAT) 0.4 MG SL tablet DISSOLVE 1 TABLET UNDER THE TONGUE EVERY 5 MINUTES FOR 3 DOSES AS NEEDED FOR CHEST PAIN (Patient taking differently: Place 0.4 mg under the tongue every 5 (five) minutes as needed for chest pain.) 25 tablet 3 Taking Differently   ondansetron (ZOFRAN) 8 MG tablet Take 1 tablet (8 mg total) by mouth every 8 (eight) hours as needed for nausea or vomiting. 20 tablet 0 Taking As Needed   potassium chloride SA (KLOR-CON M) 20 MEQ tablet Take 1 tablet (20 mEq total) by mouth 2 (two) times daily. (Patient taking differently: Take 40 mEq by mouth daily.) 180 tablet 3 02/03/2023   rosuvastatin (CRESTOR) 40 MG tablet Take 1 tablet (40 mg total) by mouth daily. 90 tablet 1 02/02/2023   spironolactone (ALDACTONE) 50 MG tablet Take 1 tablet (50 mg total) by mouth daily. 90 tablet 3 02/03/2023   Tiotropium Bromide-Olodaterol (STIOLTO RESPIMAT) 2.5-2.5 MCG/ACT AERS Inhale 2 puffs into the lungs daily. 4 g 6 02/03/2023   warfarin (COUMADIN) 5 MG tablet Take 7.5 mg on Monday and Wed and 5 mg all other days (Patient taking differently: Take 5 mg by mouth See admin instructions. Take 5mg  (one tablet) once daily on Tuesdays, Thursdays, Saturdays, and Sundays. To be taken in addition to warfarin 7.5mg  once daily on Mondays, Wednesdays, and Fridays.) 90 tablet 3 02/02/2023 at  8:00 PM   Accu-Chek Softclix Lancets lancets Use as instructed (Patient taking differently: 1 each by Other route See admin instructions. Use as instructed) 100 each 12    Blood Glucose Monitoring Suppl (ACCU-CHEK GUIDE) w/Device KIT Check glucose every morning (Patient taking differently: 1 each by Other route See admin instructions. Check glucose every morning) 1 kit 0    dapagliflozin propanediol (FARXIGA) 5 MG TABS tablet Take 5 mg by mouth daily.      glucose blood (ACCU-CHEK GUIDE) test strip Use as instructed (Patient taking differently: 1 each by Other route as needed for other (Glucose  chesk). Use as instructed) 100 each 12    vortioxetine HBr (TRINTELLIX) 5 MG TABS tablet Take 1 tablet (5 mg total) by mouth at bedtime. (Patient not taking: Reported on 02/03/2023) 30 tablet 2 Not Taking    Assessment: 62 yo F presents with wide-complex tachycardia now in NSR s/p DCCV x1 shock. Patient has a history of rheumatic heart disease and is s/p mechanical MVR and mechanical AVR in 2018 and Afib anticoagulated with warfarin with goal INR of 2.5-3.5 PTA. Patient is  being admitted for VT work-up per cardiology recs and pharmacy has been consulted to bridge heparin while off of warfarin.   PTA warfarin dose: 5mg /d except take 7.5mg  on MWF. Last clinic visit 12/10 w/ INR 3.2    She is s/p EP study 12/17 and heparin was restarted and warfarin was restarted 12/18. Patient was initially scheduled for PM implant on 12/20 and this was delayed and now likely to take place next week.  Heparin level therapeutic at 0.47 on 1550 units/hr. INR is within pre-procedure goal at 1.9. Hgb 9.8, plt wnl. No signs of bleeding noted  Goal of Therapy:  Heparin level 0.3-0.7 units/ml INR pre-procedure goal: 1.8-2.2 Monitor platelets by anticoagulation protocol: Yes   Plan:  -Give warfarin 2.5 mg x 1 -Continue UFH IV at 1550 units/hr  -Daily INR, heparin level, and CBC -Monitor for s/sx of bleeding   Thank you for involving pharmacy in the patient's care.   Theotis Burrow, PharmD PGY1 Acute Care Pharmacy Resident  02/09/2023 6:37 AM

## 2023-02-09 NOTE — Plan of Care (Signed)
  Problem: Activity: Goal: Risk for activity intolerance will decrease Outcome: Progressing   

## 2023-02-10 ENCOUNTER — Other Ambulatory Visit: Payer: Self-pay

## 2023-02-10 ENCOUNTER — Encounter (HOSPITAL_COMMUNITY)
Admission: EM | Disposition: A | Discharge status: Home / Self Care | Payer: Self-pay | Source: Home / Self Care | Attending: Cardiology

## 2023-02-10 DIAGNOSIS — I472 Ventricular tachycardia, unspecified: Secondary | ICD-10-CM | POA: Diagnosis not present

## 2023-02-10 HISTORY — PX: ICD IMPLANT: EP1208

## 2023-02-10 LAB — GLUCOSE, CAPILLARY
Glucose-Capillary: 106 mg/dL — ABNORMAL HIGH (ref 70–99)
Glucose-Capillary: 96 mg/dL (ref 70–99)
Glucose-Capillary: 104 mg/dL — ABNORMAL HIGH (ref 70–99)

## 2023-02-10 LAB — CBC
HCT: 30.3 % — ABNORMAL LOW (ref 36.0–46.0)
Hemoglobin: 9.5 g/dL — ABNORMAL LOW (ref 12.0–15.0)
MCH: 29.3 pg (ref 26.0–34.0)
MCHC: 31.4 g/dL (ref 30.0–36.0)
MCV: 93.5 fL (ref 80.0–100.0)
Platelets: 205 10*3/uL (ref 150–400)
RBC: 3.24 MIL/uL — ABNORMAL LOW (ref 3.87–5.11)
RDW: 15.8 % — ABNORMAL HIGH (ref 11.5–15.5)
WBC: 6.5 10*3/uL (ref 4.0–10.5)
nRBC: 0 % (ref 0.0–0.2)

## 2023-02-10 LAB — BASIC METABOLIC PANEL
Anion gap: 9 (ref 5–15)
BUN: 22 mg/dL (ref 8–23)
CO2: 20 mmol/L — ABNORMAL LOW (ref 22–32)
Calcium: 8.9 mg/dL (ref 8.9–10.3)
Chloride: 111 mmol/L (ref 98–111)
Creatinine, Ser: 1.38 mg/dL — ABNORMAL HIGH (ref 0.44–1.00)
GFR, Estimated: 44 mL/min — ABNORMAL LOW (ref >60)
Glucose, Bld: 117 mg/dL — ABNORMAL HIGH (ref 70–99)
Potassium: 4 mmol/L (ref 3.5–5.1)
Sodium: 140 mmol/L (ref 135–145)

## 2023-02-10 LAB — SURGICAL PCR SCREEN
MRSA, PCR: NEGATIVE
Staphylococcus aureus: NEGATIVE

## 2023-02-10 LAB — HEPARIN LEVEL (UNFRACTIONATED): Heparin Unfractionated: 0.43 [IU]/mL (ref 0.30–0.70)

## 2023-02-10 LAB — MAGNESIUM: Magnesium: 2.2 mg/dL (ref 1.7–2.4)

## 2023-02-10 LAB — PROTIME-INR
INR: 1.7 — ABNORMAL HIGH (ref 0.8–1.2)
Prothrombin Time: 19.9 s — ABNORMAL HIGH (ref 11.4–15.2)

## 2023-02-10 SURGERY — ICD IMPLANT

## 2023-02-10 MED ORDER — VANCOMYCIN HCL IN DEXTROSE 1-5 GM/200ML-% IV SOLN
1000.0000 mg | Freq: Two times a day (BID) | INTRAVENOUS | Status: AC
  Administered 2023-02-11: 1000 mg via INTRAVENOUS
  Filled 2023-02-10: qty 200

## 2023-02-10 MED ORDER — CHLORHEXIDINE GLUCONATE 4 % EX SOLN
60.0000 mL | Freq: Once | CUTANEOUS | Status: AC
  Administered 2023-02-10: 4 via TOPICAL

## 2023-02-10 MED ORDER — MIDAZOLAM HCL 2 MG/2ML IJ SOLN
INTRAMUSCULAR | Status: AC
Start: 2023-02-10 — End: ?
  Filled 2023-02-10: qty 2

## 2023-02-10 MED ORDER — VANCOMYCIN HCL IN DEXTROSE 1-5 GM/200ML-% IV SOLN
INTRAVENOUS | Status: AC
  Administered 2023-02-10: 1 mg
  Filled 2023-02-10: qty 200

## 2023-02-10 MED ORDER — FENTANYL CITRATE (PF) 100 MCG/2ML IJ SOLN
INTRAMUSCULAR | Status: DC | PRN
  Administered 2023-02-10 (×3): 25 ug via INTRAVENOUS

## 2023-02-10 MED ORDER — LIDOCAINE HCL (PF) 1 % IJ SOLN
INTRAMUSCULAR | Status: AC
  Filled 2023-02-10: qty 60

## 2023-02-10 MED ORDER — SODIUM CHLORIDE 0.9 % IV SOLN
80.0000 mg | INTRAVENOUS | Status: AC
  Administered 2023-02-10: 80 mg
  Filled 2023-02-10: qty 2

## 2023-02-10 MED ORDER — CEFAZOLIN SODIUM-DEXTROSE 2-4 GM/100ML-% IV SOLN
2.0000 g | INTRAVENOUS | Status: DC
  Filled 2023-02-10: qty 100

## 2023-02-10 MED ORDER — MIDAZOLAM HCL 5 MG/5ML IJ SOLN
INTRAMUSCULAR | Status: DC | PRN
  Administered 2023-02-10 (×3): 1 mg via INTRAVENOUS

## 2023-02-10 MED ORDER — WARFARIN SODIUM 5 MG PO TABS: 10.0000 mg | ORAL_TABLET | Freq: Once | ORAL | Status: DC

## 2023-02-10 MED ORDER — METHYLPREDNISOLONE SODIUM SUCC 125 MG IJ SOLR
125.0000 mg | Freq: Once | INTRAMUSCULAR | Status: DC
Start: 2023-02-10 — End: 2023-02-10

## 2023-02-10 MED ORDER — FENTANYL CITRATE (PF) 100 MCG/2ML IJ SOLN
INTRAMUSCULAR | Status: AC
  Filled 2023-02-10: qty 2

## 2023-02-10 MED ORDER — LIDOCAINE HCL (PF) 1 % IJ SOLN
INTRAMUSCULAR | Status: DC | PRN
  Administered 2023-02-10: 60 mL

## 2023-02-10 MED ORDER — WARFARIN SODIUM 5 MG PO TABS
10.0000 mg | ORAL_TABLET | Freq: Once | ORAL | Status: AC
  Administered 2023-02-10: 10 mg via ORAL
  Filled 2023-02-10: qty 2

## 2023-02-10 MED ORDER — SODIUM CHLORIDE 0.9 % IV SOLN: INTRAVENOUS | Status: DC

## 2023-02-10 MED ORDER — AMIODARONE HCL 200 MG PO TABS
400.0000 mg | ORAL_TABLET | Freq: Two times a day (BID) | ORAL | Status: DC
  Administered 2023-02-10 – 2023-02-11 (×2): 400 mg via ORAL
  Filled 2023-02-10 (×2): qty 2

## 2023-02-10 MED ORDER — HEPARIN (PORCINE) IN NACL 1000-0.9 UT/500ML-% IV SOLN
INTRAVENOUS | Status: DC | PRN
  Administered 2023-02-10: 500 mL

## 2023-02-10 MED ORDER — SODIUM CHLORIDE 0.9 % IV SOLN
INTRAVENOUS | Status: AC
  Filled 2023-02-10: qty 2

## 2023-02-10 MED ORDER — DIPHENHYDRAMINE HCL 50 MG/ML IJ SOLN: 25.0000 mg | Freq: Once | INTRAMUSCULAR | Status: DC

## 2023-02-10 MED ORDER — VANCOMYCIN HCL IN DEXTROSE 1-5 GM/200ML-% IV SOLN: 1000.0000 mg | Freq: Once | INTRAVENOUS | Status: DC

## 2023-02-10 SURGICAL SUPPLY — 9 items
CABLE SURGICAL S-101-97-12 (CABLE) ×1 IMPLANT
ICD COBALT XT DR DDPA2D4 (ICD Generator) IMPLANT
LEAD CAPSURE NOVUS 5076-52CM (Lead) IMPLANT
LEAD SPRINT QUAT SEC 6935M-62 (Lead) IMPLANT
PAD DEFIB RADIO PHYSIO CONN (PAD) ×1 IMPLANT
SHEATH 7FR PRELUDE SNAP 13 (SHEATH) IMPLANT
SHEATH 9FR PRELUDE SNAP 13 (SHEATH) IMPLANT
SHEATH PROBE COVER 6X72 (BAG) IMPLANT
TRAY PACEMAKER INSERTION (PACKS) ×1 IMPLANT

## 2023-02-10 NOTE — Plan of Care (Signed)
  Problem: Education: Goal: Knowledge of General Education information will improve Description: Including pain rating scale, medication(s)/side effects and non-pharmacologic comfort measures Outcome: Progressing   Problem: Health Behavior/Discharge Planning: Goal: Ability to manage health-related needs will improve Outcome: Progressing   Problem: Clinical Measurements: Goal: Ability to maintain clinical measurements within normal limits will improve Outcome: Progressing Goal: Will remain free from infection Outcome: Progressing Goal: Diagnostic test results will improve Outcome: Progressing Goal: Respiratory complications will improve Outcome: Progressing Goal: Cardiovascular complication will be avoided Outcome: Progressing   Problem: Activity: Goal: Risk for activity intolerance will decrease Outcome: Progressing   Problem: Nutrition: Goal: Adequate nutrition will be maintained Outcome: Progressing   Problem: Coping: Goal: Level of anxiety will decrease Outcome: Progressing   Problem: Elimination: Goal: Will not experience complications related to bowel motility Outcome: Progressing Goal: Will not experience complications related to urinary retention Outcome: Progressing   Problem: Pain Management: Goal: General experience of comfort will improve Outcome: Progressing   Problem: Safety: Goal: Ability to remain free from injury will improve Outcome: Progressing   Problem: Skin Integrity: Goal: Risk for impaired skin integrity will decrease Outcome: Progressing   Problem: Education: Goal: Ability to describe self-care measures that may prevent or decrease complications (Diabetes Survival Skills Education) will improve Outcome: Progressing Goal: Individualized Educational Video(s) Outcome: Progressing   Problem: Coping: Goal: Ability to adjust to condition or change in health will improve Outcome: Progressing   Problem: Fluid Volume: Goal: Ability to  maintain a balanced intake and output will improve Outcome: Progressing   Problem: Health Behavior/Discharge Planning: Goal: Ability to identify and utilize available resources and services will improve Outcome: Progressing Goal: Ability to manage health-related needs will improve Outcome: Progressing   Problem: Metabolic: Goal: Ability to maintain appropriate glucose levels will improve Outcome: Progressing   Problem: Nutritional: Goal: Maintenance of adequate nutrition will improve Outcome: Progressing Goal: Progress toward achieving an optimal weight will improve Outcome: Progressing   Problem: Skin Integrity: Goal: Risk for impaired skin integrity will decrease Outcome: Progressing   Problem: Tissue Perfusion: Goal: Adequacy of tissue perfusion will improve Outcome: Progressing   Problem: Education: Goal: Understanding of disease, treatment, and recovery process will improve Outcome: Progressing   Problem: Activity: Goal: Ability to return to baseline activity level will improve Outcome: Progressing   Problem: Cardiac: Goal: Ability to maintain adequate cardiovascular perfusion will improve Outcome: Progressing Goal: Vascular access site(s) Level 0-1 will be maintained Outcome: Progressing   Problem: Health Behavior/ Discharge Planning: Goal: Ability to safely manage health related needs after discharge Outcome: Progressing   Problem: Education: Goal: Knowledge of cardiac device and self-care will improve Outcome: Progressing Goal: Ability to safely manage health related needs after discharge will improve Outcome: Progressing Goal: Individualized Educational Video(s) Outcome: Progressing   Problem: Cardiac: Goal: Ability to achieve and maintain adequate cardiopulmonary perfusion will improve Outcome: Progressing

## 2023-02-10 NOTE — Discharge Instructions (Signed)
After Your ICD (Implantable Cardiac Defibrillator)   ACTIVITY Do not lift your arm above shoulder height for 1 week after your procedure. After 7 days, you may progress as below.  You should remove your sling 24 hours after your procedure, unless otherwise instructed by your provider.     Monday February 17, 2023  Tuesday February 18, 2023 Wednesday February 19, 2023 Thursday February 20, 2023   Do not lift, push, pull, or carry anything over 10 pounds with the affected arm until 6 weeks (Monday March 24, 2023 ) after your procedure.   You may drive AFTER your wound check, unless you have been told otherwise by your provider.   Ask your healthcare provider when you can go back to work   INCISION/Dressing Continue your coumadin as directed.   If large square, outer bandage is left in place, this can be removed after 24 hours from your procedure. Do not remove steri-strips or glue as below.   Monitor your defibrillator site for redness, swelling, and drainage. Call the device clinic at [phone] if you experience these symptoms or fever/chills.  If your incision is sealed with Steri-strips or staples, you may shower 7 days after your procedure or when told by your provider. Do not remove the steri-strips or let the shower hit directly on your site. You may wash around your site with soap and water.    If you were discharged in a sling, please do not wear this during the day more than 48 hours after your surgery unless otherwise instructed. This may increase the risk of stiffness and soreness in your shoulder.   Avoid lotions, ointments, or perfumes over your incision until it is well-healed.  You may use a hot tub or a pool AFTER your wound check appointment if the incision is completely closed.  Your ICD is designed to protect you from life threatening heart rhythms. Because of this, you may receive a shock.   1 shock with no symptoms:  Call the office during business hours. 1  shock with symptoms (chest pain, chest pressure, dizziness, lightheadedness, shortness of breath, overall feeling unwell):  Call 911. If you experience 2 or more shocks in 24 hours:  Call 911. If you receive a shock, you should not drive for 6 months per the [place] DMV IF you receive appropriate therapy from your ICD.   ICD Alerts:  Some alerts are vibratory and others beep. These are NOT emergencies. Please call our office to let us know. If this occurs at night or on weekends, it can wait until the next business day. Send a remote transmission.  If your device is capable of reading fluid status (for heart failure), you will be offered monthly monitoring to review this with you.   DEVICE MANAGEMENT Remote monitoring is used to monitor your ICD from home. This monitoring is scheduled every 91 days by our office. It allows [place] to keep an eye on the functioning of your device to ensure it is working properly. You will routinely see your Electrophysiologist annually (more often if necessary).   You should receive your ID card for your new device in 4-8 weeks. Keep this card with you at all times once received. Consider wearing a medical alert bracelet or necklace.  Your ICD  may be MRI compatible. This will be discussed at your next office visit/wound check.  You should avoid contact with strong electric or magnetic fields.   Do not use amateur (ham) radio equipment or electric (arc)  welding torches. MP3 player headphones with magnets should not be used. Some devices are safe to use if held at least 12 inches (30 cm) from your defibrillator. These include power tools, lawn mowers, and speakers. If you are unsure if something is safe to use, ask your health care provider.  When using your cell phone, hold it to the ear that is on the opposite side from the defibrillator. Do not leave your cell phone in a pocket over the defibrillator.  You may safely use electric blankets, heating pads, computers, and  microwave ovens.  Call the office right away if: You have chest pain. You feel more than one shock. You feel more short of breath than you have felt before. You feel more light-headed than you have felt before. Your incision starts to open up.  This information is not intended to replace advice given to you by your health care provider. Make sure you discuss any questions you have with your health care provider.

## 2023-02-10 NOTE — Plan of Care (Signed)
  Problem: Education: Goal: Knowledge of General Education information will improve Description: Including pain rating scale, medication(s)/side effects and non-pharmacologic comfort measures Outcome: Progressing   Problem: Health Behavior/Discharge Planning: Goal: Ability to manage health-related needs will improve Outcome: Progressing   Problem: Clinical Measurements: Goal: Ability to maintain clinical measurements within normal limits will improve Outcome: Progressing Goal: Cardiovascular complication will be avoided Outcome: Progressing   Problem: Activity: Goal: Risk for activity intolerance will decrease Outcome: Progressing   Problem: Coping: Goal: Level of anxiety will decrease Outcome: Progressing   Problem: Pain Management: Goal: General experience of comfort will improve Outcome: Progressing   Problem: Nutrition: Goal: Adequate nutrition will be maintained Outcome: Completed/Met   Problem: Elimination: Goal: Will not experience complications related to urinary retention Outcome: Completed/Met

## 2023-02-10 NOTE — Progress Notes (Addendum)
Patient Name: Kimberly Hobbs Date of Encounter: 02/10/2023  Primary Cardiologist: Kimberly Ripple, DO Electrophysiologist: Kimberly Lemming, MD  Interval Summary   Feeling ok this AM, was in Afib all night  Denies chest pain, chest pressure. NPO for ICD later today   Vital Signs    Vitals:   02/09/23 2155 02/10/23 0005 02/10/23 0412 02/10/23 0756  BP: (!) 112/58 109/72 (!) 102/59 (!) 90/58  Pulse:  (!) 58 86 (!) 50  Resp:  20 18 18   Temp:  97.9 F (36.6 C) 98.5 F (36.9 C) 99.1 F (37.3 C)  TempSrc:  Oral Oral Axillary  SpO2:  95% 97% 98%  Weight:      Height:        Intake/Output Summary (Last 24 hours) at 02/10/2023 0839 Last data filed at 02/10/2023 0412 Gross per 24 hour  Intake 780.29 ml  Output 2350 ml  Net -1569.71 ml   Filed Weights   02/04/23 0600 02/06/23 0600 02/08/23 2147  Weight: 93.2 kg 92.6 kg 95.7 kg    Physical Exam    GEN: No acute distress, A&O x 3, appropriate affect  Neck: No JVD Cardiac: RRR, no murmurs, rubs, or gallops.  Respiratory: decreased BS bases bilaterally. GI: Soft, nontender, non-distended  MS: No edema; No deformity.   Telemetry    SR with rare NSVT Tx to AFib ~2200 and has remained in AFib (personally reviewed)   Hospital Course    Kimberly Hobbs is a 62 y.o. female with PMH  AF, CAD s/p CABG rheumatic heart disease s/p mechanical MVR / AVR, CKD, DM, HLD admitted for [facility].  S/p DCCV in ER. Troponin, BNP negative. LHC deferred. Recurrent VT on 12/17 s/p DCCV x2. S/p EPS 12/17 but unable to induce VT. cMRI with LVEF 40%, RV insertion site LGE (non-specific).   Assessment & Plan    1.  Ventricular tachycardia:  VT improved, continue 30 mg/h of amiodarone. Not a candidate for ablation due to mechanical mitral and aortic valves. NPO for ICD implant Discussed risks/benefits of procedure with patient. She verbalized understanding and wished to proceed. Hep gtt off, do not resume post-procedure  2.  Persistent  atrial fibrillation:  Post ablation x 2.   Continue amiodarone load  3.  Coronary artery disease:  No current chest pain  4.  Valvular heart disease:  Post mechanical AVR/MVR.   Continue warfarin Appreciate pharm's assistance with coumadin dosing  5.  Diabetes:  Sliding scale insulin    For questions or updates, please contact [facility] Please consult [email or web address] for contact info under Cardiology/STEMI.   Kimberly Don, NP 02/10/2023 8:39 AM   I have seen and examined this patient with Kimberly Hobbs.  Agree with above, note added to reflect my findings.  Short episodes of nonsustained VT and atrial fibrillation.  Otherwise no acute complaints.  GEN: Well nourished, well developed, in no acute distress  HEENT: normal  Neck: no JVD, carotid bruits, or masses Cardiac: RRR; no murmurs, rubs, or gallops,no edema  Respiratory:  clear to auscultation bilaterally, normal work of breathing GI: soft, nontender, nondistended, + BS MS: no deformity or atrophy  Skin: warm and dry Neuro:  Strength and sensation are intact Psych: euthymic mood, full affect   Ventricular tachycardia: Continue amiodarone at the current dose.  Kimberly Hobbs likely switch to p.o. amiodarone after the current IV bag has finished.  Kimberly Hobbs plan for ICD implant today.  Risk and benefits have been discussed.  Risk include bleeding, tamponade, infection,  pneumothorax, lead dislodgment, MI, worsening renal failure, CVA, death.  She understands these risks and is agreed to the procedure. Persistent atrial fibrillation: Post ablation x 2.  Continue amiodarone. Coronary artery disease: No current chest pain Valvular heart disease: Post MVR/AVR.  Continue warfarin. Diabetes: Continue sliding scale insulin Acute heart failure: EF reduced currently with NSVT and AF. No obvious volume overload. Continue current management  Kimberly Hobbs M. Kimberly Stampley MD 02/10/2023 10:10 AM

## 2023-02-10 NOTE — Progress Notes (Addendum)
PHARMACY - ANTICOAGULATION CONSULT NOTE  Pharmacy Consult for warfarin Indication: atrial fibrillation and mechanical MVR and mechanical AVR  Allergies  Allergen Reactions   Duloxetine Hcl Other (See Comments)    Behavioral changes, sleep disturbances   Iodinated Contrast Media Other (See Comments)    Patient is unsure of reaction type   Penicillins Other (See Comments)    Immune to drug , does not work per patient     Patient Measurements: Height: 5\' 5"  (165.1 cm) Weight: 95.7 kg (210 lb 14.4 oz) IBW/kg (Calculated) : 57 Heparin Dosing Weight: 78.3 kg  Vital Signs: Temp: 98.5 F (36.9 C) (12/23 0412) Temp Source: Oral (12/23 0412) BP: 102/59 (12/23 0412) Pulse Rate: 86 (12/23 0412)  Labs: Recent Labs    02/07/23 0848 02/07/23 1009 02/08/23 0252 02/08/23 0252 02/08/23 1324 02/09/23 0423 02/10/23 0337  HGB  --   --  11.1*   < >  --  9.8* 9.5*  HCT  --   --  34.6*  --   --  30.9* 30.3*  PLT  --   --  243  --   --  230 205  LABPROT  --   --  22.8*  --   --  21.8* 19.9*  INR  --   --  2.0*  --   --  1.9* 1.7*  HEPARINUNFRC  --   --  0.32   < > 0.41 0.47 0.43  CREATININE 1.25*  --  1.12*  --   --  1.33*  --   TROPONINIHS 5 5  --   --   --   --   --    < > = values in this interval not displayed.    Estimated Creatinine Clearance: 50.8 mL/min (A) (by C-G formula based on SCr of 1.33 mg/dL (H)).   Medical History: Past Medical History:  Diagnosis Date   Allergy ?   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   Past Month   aspirin EC 81 MG tablet Take 81 mg by mouth daily.    02/03/2023   clonazePAM (KLONOPIN) 0.5 MG tablet Take 1.5 tablets (0.75 mg total) by mouth at bedtime. 45 tablet 0 02/02/2023   cyclobenzaprine (FLEXERIL) 10 MG tablet Take 1 tablet (10 mg total) by mouth at bedtime as needed for muscle spasms. 30 tablet 3 Taking As Needed   diclofenac Sodium (VOLTAREN ARTHRITIS PAIN) 1 % GEL Apply 4 g topically 4 (four) times daily. (Patient taking differently: Apply 4 g topically 4 (four) times daily as needed (pain).) 150 g 5 Taking Differently   diltiazem (CARDIZEM) 30 MG tablet Take 1  tablet every 4 hours AS NEEDED for AFIB heart rate >100 as long as top BP >100. 30 tablet 2 02/03/2023   furosemide (LASIX) 20 MG tablet Take 1 tablet (20 mg total) by mouth 2 (two) times daily. 60 tablet 3 02/03/2023   gabapentin (NEURONTIN) 100 MG capsule Take 1 capsule (100 mg total) by mouth at bedtime. 30 capsule 5 02/02/2023   HYDROcodone-acetaminophen (NORCO) 7.5-325 MG tablet Take 1 tablet by mouth every 12 (twelve) hours as needed for moderate pain (pain score 4-6). 60 tablet 0 02/02/2023   isosorbide mononitrate (IMDUR) 60 MG 24 hr tablet Take 1 tablet (60 mg total) by mouth daily. 90 tablet 3 02/03/2023   losartan (COZAAR) 50 MG tablet HOLD while on higher dose of metoprolol 12/11   Taking   metFORMIN (GLUCOPHAGE) 500 MG tablet Take 1 tablet (500 mg total) by mouth 2 (two) times daily with a meal. 180 tablet 3 02/03/2023   metoprolol succinate (TOPROL XL) 50 MG 24 hr tablet Take 1 tablet (50 mg total) by mouth 2 (two) times daily. 60 tablet 3 02/03/2023   montelukast  (SINGULAIR) 10 MG tablet Take 1 tablet (10 mg total) by mouth daily. 30 tablet 11 02/02/2023   nitroGLYCERIN (NITROSTAT) 0.4 MG SL tablet DISSOLVE 1 TABLET UNDER THE TONGUE EVERY 5 MINUTES FOR 3 DOSES AS NEEDED FOR CHEST PAIN (Patient taking differently: Place 0.4 mg under the tongue every 5 (five) minutes as needed for chest pain.) 25 tablet 3 Taking Differently   ondansetron (ZOFRAN) 8 MG tablet Take 1 tablet (8 mg total) by mouth every 8 (eight) hours as needed for nausea or vomiting. 20 tablet 0 Taking As Needed   potassium chloride SA (KLOR-CON M) 20 MEQ tablet Take 1 tablet (20 mEq total) by mouth 2 (two) times daily. (Patient taking differently: Take 40 mEq by mouth daily.) 180 tablet 3 02/03/2023   rosuvastatin (CRESTOR) 40 MG tablet Take 1 tablet (40 mg total) by mouth daily. 90 tablet 1 02/02/2023   spironolactone (ALDACTONE) 50 MG tablet Take 1 tablet (50 mg total) by mouth daily. 90 tablet 3 02/03/2023   Tiotropium Bromide-Olodaterol (STIOLTO RESPIMAT) 2.5-2.5 MCG/ACT AERS Inhale 2 puffs into the lungs daily. 4 g 6 02/03/2023   warfarin (COUMADIN) 5 MG tablet Take 7.5 mg on Monday and Wed and 5 mg all other days (Patient taking differently: Take 5 mg by mouth See admin instructions. Take 5mg  (one tablet) once daily on Tuesdays, Thursdays, Saturdays, and Sundays. To be taken in addition to warfarin 7.5mg  once daily on Mondays, Wednesdays, and Fridays.) 90 tablet 3 02/02/2023 at  8:00 PM   Accu-Chek Softclix Lancets lancets Use as instructed (Patient taking differently: 1 each by Other route See admin instructions. Use as instructed) 100 each 12    Blood Glucose Monitoring Suppl (ACCU-CHEK GUIDE) w/Device KIT Check glucose every morning (Patient taking differently: 1 each by Other route See admin instructions. Check glucose every morning) 1 kit 0    dapagliflozin propanediol (FARXIGA) 5 MG TABS tablet Take 5 mg by mouth daily.      glucose blood (ACCU-CHEK GUIDE) test strip Use as instructed  (Patient taking differently: 1 each by Other route as needed for other (Glucose chesk). Use as instructed) 100 each 12    vortioxetine HBr (TRINTELLIX) 5 MG TABS tablet Take 1 tablet (5 mg total) by mouth at bedtime. (Patient not taking: Reported on 02/03/2023) 30 tablet 2 Not Taking    Assessment: 62 yo F presents with wide-complex tachycardia  now in NSR s/p DCCV x1 shock. Patient has a history of rheumatic heart disease and is s/p mechanical MVR and mechanical AVR in 2018 and Afib anticoagulated with warfarin with goal INR of 2.5-3.5 PTA. Patient is being admitted for VT work-up per cardiology recs and pharmacy has been consulted to bridge heparin while off of warfarin. PTA warfarin dose: 5mg /d except take 7.5mg  on MWF. Last clinic visit 12/10 w/ INR 3.2    Pt to undergo ICD implant today, heparin stopped this am, no plans to resume postop per EP given risk of pocket hematoma. INR is subtherapeutic at 1.7 after receiving 2.5mg  over the weekend (planned pre-procedure goal of 1.8-2.2). Will give boosted dose tonight to get INR therapeutic since no plans for heparin postop - usual dose would be 7.5mg  so will give 10mg  dose.   Goal of Therapy:  INR pre-procedure goal: 1.8-2.2 Monitor platelets by anticoagulation protocol: Yes   Plan:  -Heparin stopped as above -Warfarin 10mg  PO x1 tonight -Daily INR   Fredonia Highland, PharmD, BCPS, [facility] Clinical Pharmacist [phone] Please check AMION for all [facility] Pharmacy numbers 02/10/2023

## 2023-02-11 ENCOUNTER — Inpatient Hospital Stay (HOSPITAL_COMMUNITY): Payer: 59

## 2023-02-11 ENCOUNTER — Other Ambulatory Visit (HOSPITAL_COMMUNITY): Payer: Self-pay

## 2023-02-11 ENCOUNTER — Telehealth: Payer: Self-pay

## 2023-02-11 ENCOUNTER — Encounter (HOSPITAL_COMMUNITY): Payer: Self-pay | Admitting: Cardiology

## 2023-02-11 DIAGNOSIS — I472 Ventricular tachycardia, unspecified: Secondary | ICD-10-CM | POA: Diagnosis not present

## 2023-02-11 LAB — CBC
HCT: 32.8 % — ABNORMAL LOW (ref 36.0–46.0)
Hemoglobin: 10.6 g/dL — ABNORMAL LOW (ref 12.0–15.0)
MCH: 30.3 pg (ref 26.0–34.0)
MCHC: 32.3 g/dL (ref 30.0–36.0)
MCV: 93.7 fL (ref 80.0–100.0)
Platelets: 228 10*3/uL (ref 150–400)
RBC: 3.5 MIL/uL — ABNORMAL LOW (ref 3.87–5.11)
RDW: 15.9 % — ABNORMAL HIGH (ref 11.5–15.5)
WBC: 6.9 10*3/uL (ref 4.0–10.5)
nRBC: 0 % (ref 0.0–0.2)

## 2023-02-11 LAB — BASIC METABOLIC PANEL
Anion gap: 10 (ref 5–15)
BUN: 24 mg/dL — ABNORMAL HIGH (ref 8–23)
CO2: 21 mmol/L — ABNORMAL LOW (ref 22–32)
Calcium: 9.3 mg/dL (ref 8.9–10.3)
Chloride: 107 mmol/L (ref 98–111)
Creatinine, Ser: 1.39 mg/dL — ABNORMAL HIGH (ref 0.44–1.00)
GFR, Estimated: 43 mL/min — ABNORMAL LOW (ref >60)
Glucose, Bld: 120 mg/dL — ABNORMAL HIGH (ref 70–99)
Potassium: 3.7 mmol/L (ref 3.5–5.1)
Sodium: 138 mmol/L (ref 135–145)

## 2023-02-11 LAB — PROTIME-INR
INR: 1.4 — ABNORMAL HIGH (ref 0.8–1.2)
Prothrombin Time: 17.4 s — ABNORMAL HIGH (ref 11.4–15.2)

## 2023-02-11 LAB — MAGNESIUM: Magnesium: 2.4 mg/dL (ref 1.7–2.4)

## 2023-02-11 LAB — GLUCOSE, CAPILLARY: Glucose-Capillary: 190 mg/dL — ABNORMAL HIGH (ref 70–99)

## 2023-02-11 MED ORDER — METOPROLOL SUCCINATE ER 100 MG PO TB24
100.0000 mg | ORAL_TABLET | Freq: Two times a day (BID) | ORAL | 6 refills | Status: DC
  Filled 2023-02-11 – 2023-03-08 (×2): qty 60, 30d supply, fill #0
  Filled 2023-04-04: qty 60, 30d supply, fill #1

## 2023-02-11 MED ORDER — ENOXAPARIN SODIUM 100 MG/ML IJ SOSY
100.0000 mg | PREFILLED_SYRINGE | Freq: Two times a day (BID) | INTRAMUSCULAR | 0 refills | Status: DC
  Filled 2023-02-11: qty 14, 7d supply, fill #0

## 2023-02-11 MED ORDER — MAGNESIUM OXIDE -MG SUPPLEMENT 400 (240 MG) MG PO TABS
400.0000 mg | ORAL_TABLET | Freq: Every day | ORAL | 6 refills | Status: AC
  Filled 2023-02-11 – 2023-03-08 (×2): qty 30, 30d supply, fill #0
  Filled 2023-04-04: qty 30, 30d supply, fill #1

## 2023-02-11 MED ORDER — WARFARIN SODIUM 5 MG PO TABS
10.0000 mg | ORAL_TABLET | Freq: Once | ORAL | Status: AC
Start: 2023-02-11 — End: 2023-02-11
  Administered 2023-02-11: 10 mg via ORAL
  Filled 2023-02-11: qty 2

## 2023-02-11 MED ORDER — ACETAMINOPHEN 325 MG PO TABS: 650.0000 mg | ORAL_TABLET | ORAL | Status: DC | PRN

## 2023-02-11 MED ORDER — ENOXAPARIN SODIUM 100 MG/ML IJ SOSY
100.0000 mg | PREFILLED_SYRINGE | Freq: Two times a day (BID) | INTRAMUSCULAR | Status: DC
  Administered 2023-02-11: 100 mg via SUBCUTANEOUS
  Filled 2023-02-11: qty 1

## 2023-02-11 MED ORDER — WARFARIN SODIUM 5 MG PO TABS: ORAL_TABLET | ORAL | Status: DC

## 2023-02-11 MED ORDER — FUROSEMIDE 20 MG PO TABS
20.0000 mg | ORAL_TABLET | Freq: Every day | ORAL | 6 refills | Status: DC
  Filled 2023-02-11: qty 30, 30d supply, fill #0

## 2023-02-11 MED ORDER — SACUBITRIL-VALSARTAN 24-26 MG PO TABS
1.0000 | ORAL_TABLET | Freq: Two times a day (BID) | ORAL | 3 refills | Status: DC
  Filled 2023-02-11 – 2023-03-08 (×2): qty 60, 30d supply, fill #0
  Filled 2023-04-04: qty 60, 30d supply, fill #1

## 2023-02-11 MED ORDER — AMIODARONE HCL 200 MG PO TABS
ORAL_TABLET | ORAL | 0 refills | Status: DC
  Filled 2023-02-11: qty 50, 35d supply, fill #0

## 2023-02-11 MED FILL — Midazolam HCl Inj 2 MG/2ML (Base Equivalent): INTRAMUSCULAR | Qty: 3 | Status: AC

## 2023-02-11 NOTE — Care Management Important Message (Signed)
Important Message  Patient Details  Name: Kimberly Hobbs MRN: [id number] Date of Birth: 25-Mar-1960   Important Message Given:  Yes - Medicare IM     Renie Ora 02/11/2023, 12:45 PM

## 2023-02-11 NOTE — Telephone Encounter (Signed)
Follow-up after same day discharge: Implant date: 02/10/2023 MD: Loman Brooklyn, MD Device: icd mdt  Location: l chest   Wound check visit: yes 90 day MD follow-up: yes  Remote Transmission received:not yet takes 24 hours will check back.  Dressing/sling removed: yes  Confirm OAC restart on: yes  Please continue to monitor your cardiac device site for redness, swelling, and drainage. Call the device clinic at [phone] if you experience these symptoms, fever/chills, or have questions about your device.   Remote monitoring is used to monitor your cardiac device from home. This monitoring is scheduled every 91 days by our office. It allows [place] to keep an eye on the functioning of your device to ensure it is working properly.

## 2023-02-11 NOTE — TOC Transition Note (Signed)
Transition of Care [facility]) - Discharge Note   Patient Details  Name: Kimberly Hobbs MRN: [id number] Date of Birth: February 27, 1960  Transition of Care [facility]) CM/SW Contact:  Gala Lewandowsky, RN Phone Number: 02/11/2023, 9:52 AM   Clinical Narrative: Case Manager received notification that the patient will discharge home today. Patient reports that she has no transportation home. Cab voucher approved by Musician and the Staff RN to call [employer] once ready. NO further home needs identified.   Final next level of care: Home/Self Care Barriers to Discharge: No Barriers Identified   Patient Goals and CMS Choice Patient states their goals for this hospitalization and ongoing recovery are:: wants to recover  Discharge Plan and Services Additional resources added to the After Visit Summary for     Discharge Planning Services: CM Consult              DME Agency: NA  Social Drivers of Health (SDOH) Interventions SDOH Screenings   Food Insecurity: No Food Insecurity (02/03/2023)  Housing: Low Risk  (02/03/2023)  Transportation Needs: No Transportation Needs (02/03/2023)  Utilities: Not At Risk (02/03/2023)  Alcohol Screen: Low Risk  (10/10/2021)  Depression (PHQ2-9): Low Risk  (01/07/2023)  Recent Concern: Depression (PHQ2-9) - Medium Risk (12/23/2022)  Financial Resource Strain: Low Risk  (10/14/2022)  Physical Activity: Inactive (10/14/2022)  Social Connections: Moderately Isolated (10/14/2022)  Stress: Stress Concern Present (10/14/2022)  Tobacco Use: Medium Risk (02/03/2023)  Health Literacy: Adequate Health Literacy (10/14/2022)   Readmission Risk Interventions     No data to display

## 2023-02-11 NOTE — Progress Notes (Addendum)
PHARMACY - ANTICOAGULATION CONSULT NOTE  Pharmacy Consult for warfarin Indication: atrial fibrillation and mechanical MVR and mechanical AVR  Allergies  Allergen Reactions   Duloxetine Hcl Other (See Comments)    Behavioral changes, sleep disturbances   Iodinated Contrast Media Other (See Comments)    Patient is unsure of reaction type   Penicillins Other (See Comments)    Immune to drug , does not work per patient     Patient Measurements: Height: 5\' 5"  (165.1 cm) Weight: 95.7 kg (210 lb 14.4 oz) IBW/kg (Calculated) : 57 Heparin Dosing Weight: 78.3 kg  Vital Signs: Temp: 98 F (36.7 C) (12/24 0445) Temp Source: Oral (12/24 0445) BP: 111/64 (12/24 0445) Pulse Rate: 90 (12/23 2019)  Labs: Recent Labs    02/08/23 1324 02/09/23 0423 02/09/23 0423 02/10/23 0337 02/10/23 0732 02/11/23 0347  HGB  --  9.8*   < > 9.5*  --  10.6*  HCT  --  30.9*  --  30.3*  --  32.8*  PLT  --  230  --  205  --  228  LABPROT  --  21.8*  --  19.9*  --  17.4*  INR  --  1.9*  --  1.7*  --  1.4*  HEPARINUNFRC 0.41 0.47  --  0.43  --   --   CREATININE  --  1.33*  --   --  1.38* 1.39*   < > = values in this interval not displayed.    Estimated Creatinine Clearance: 48.6 mL/min (A) (by C-G formula based on SCr of 1.39 mg/dL (H)).   Medical History: Past Medical History:  Diagnosis Date   Allergy ?   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   Past Month   aspirin EC 81 MG tablet Take 81 mg by mouth daily.    02/03/2023   clonazePAM (KLONOPIN) 0.5 MG tablet Take 1.5 tablets (0.75 mg total) by mouth at bedtime. 45 tablet 0 02/02/2023   cyclobenzaprine (FLEXERIL) 10 MG tablet Take 1 tablet (10 mg total) by mouth at bedtime as needed for muscle spasms. 30 tablet 3 Taking As Needed   diclofenac Sodium (VOLTAREN ARTHRITIS PAIN) 1 % GEL Apply 4 g topically 4 (four) times daily. (Patient taking differently: Apply 4 g topically 4 (four) times daily as needed (pain).) 150 g 5 Taking Differently   diltiazem (CARDIZEM) 30 MG tablet Take 1 tablet every 4 hours AS NEEDED for AFIB heart rate >100 as long as top BP >100. 30 tablet 2 02/03/2023   furosemide (LASIX) 20 MG tablet Take 1 tablet (20 mg total) by mouth 2 (two) times daily. 60  tablet 3 02/03/2023   gabapentin (NEURONTIN) 100 MG capsule Take 1 capsule (100 mg total) by mouth at bedtime. 30 capsule 5 02/02/2023   HYDROcodone-acetaminophen (NORCO) 7.5-325 MG tablet Take 1 tablet by mouth every 12 (twelve) hours as needed for moderate pain (pain score 4-6). 60 tablet 0 02/02/2023   isosorbide mononitrate (IMDUR) 60 MG 24 hr tablet Take 1 tablet (60 mg total) by mouth daily. 90 tablet 3 02/03/2023   losartan (COZAAR) 50 MG tablet HOLD while on higher dose of metoprolol 12/11   Taking   metFORMIN (GLUCOPHAGE) 500 MG tablet Take 1 tablet (500 mg total) by mouth 2 (two) times daily with a meal. 180 tablet 3 02/03/2023   metoprolol succinate (TOPROL XL) 50 MG 24 hr tablet Take 1 tablet (50 mg total) by mouth 2 (two) times daily. 60 tablet 3 02/03/2023   montelukast (SINGULAIR) 10 MG tablet Take 1 tablet (10 mg total) by mouth daily. 30 tablet 11  02/02/2023   nitroGLYCERIN (NITROSTAT) 0.4 MG SL tablet DISSOLVE 1 TABLET UNDER THE TONGUE EVERY 5 MINUTES FOR 3 DOSES AS NEEDED FOR CHEST PAIN (Patient taking differently: Place 0.4 mg under the tongue every 5 (five) minutes as needed for chest pain.) 25 tablet 3 Taking Differently   ondansetron (ZOFRAN) 8 MG tablet Take 1 tablet (8 mg total) by mouth every 8 (eight) hours as needed for nausea or vomiting. 20 tablet 0 Taking As Needed   potassium chloride SA (KLOR-CON M) 20 MEQ tablet Take 1 tablet (20 mEq total) by mouth 2 (two) times daily. (Patient taking differently: Take 40 mEq by mouth daily.) 180 tablet 3 02/03/2023   rosuvastatin (CRESTOR) 40 MG tablet Take 1 tablet (40 mg total) by mouth daily. 90 tablet 1 02/02/2023   spironolactone (ALDACTONE) 50 MG tablet Take 1 tablet (50 mg total) by mouth daily. 90 tablet 3 02/03/2023   Tiotropium Bromide-Olodaterol (STIOLTO RESPIMAT) 2.5-2.5 MCG/ACT AERS Inhale 2 puffs into the lungs daily. 4 g 6 02/03/2023   warfarin (COUMADIN) 5 MG tablet Take 7.5 mg on Monday and Wed and 5 mg all other days (Patient taking differently: Take 5 mg by mouth See admin instructions. Take 5mg  (one tablet) once daily on Tuesdays, Thursdays, Saturdays, and Sundays. To be taken in addition to warfarin 7.5mg  once daily on Mondays, Wednesdays, and Fridays.) 90 tablet 3 02/02/2023 at  8:00 PM   Accu-Chek Softclix Lancets lancets Use as instructed (Patient taking differently: 1 each by Other route See admin instructions. Use as instructed) 100 each 12    Blood Glucose Monitoring Suppl (ACCU-CHEK GUIDE) w/Device KIT Check glucose every morning (Patient taking differently: 1 each by Other route See admin instructions. Check glucose every morning) 1 kit 0    dapagliflozin propanediol (FARXIGA) 5 MG TABS tablet Take 5 mg by mouth daily.      glucose blood (ACCU-CHEK GUIDE) test strip Use as instructed (Patient taking differently: 1 each by Other route as needed for other (Glucose  chesk). Use as instructed) 100 each 12    vortioxetine HBr (TRINTELLIX) 5 MG TABS tablet Take 1 tablet (5 mg total) by mouth at bedtime. (Patient not taking: Reported on 02/03/2023) 30 tablet 2 Not Taking    Assessment: 62 yo F presents with wide-complex tachycardia now in NSR s/p DCCV x1 shock. Patient has a history of rheumatic heart disease and is s/p mechanical MVR and mechanical AVR in 2018 and Afib anticoagulated with warfarin with goal INR of 2.5-3.5 PTA. Patient is being admitted for  VT work-up per cardiology recs and pharmacy has been consulted to bridge heparin while off of warfarin. PTA warfarin dose: 5mg /d except take 7.5mg  on MWF. Last clinic visit 12/10 w/ INR 3.2    Pt s/p ICD implant 12/23, heparin stopped preop with no plans to resume per EP given risk of pocket hematoma. INR today remains subtherapeutic at 1.4 despite boosted dose yesterday evening. Will boost again tonight.   Goal of Therapy:  INR pre-procedure goal: 1.8-2.2 Monitor platelets by anticoagulation protocol: Yes   Plan:  -Warfarin 10mg  PO x1 tonight -Daily INR  ADDENDUM will bridge with enoxaparin syringes per EP. Will use 1mg /kg BID dosing (100mg ).   Fredonia Highland, PharmD, BCPS, [facility] Clinical Pharmacist [phone] Please check AMION for all [facility] Pharmacy numbers 02/11/2023

## 2023-02-11 NOTE — Progress Notes (Signed)
Discharge instructions given. Patient verbalized understanding and all questions were answered.  ?

## 2023-02-11 NOTE — Discharge Summary (Addendum)
ELECTROPHYSIOLOGY PROCEDURE DISCHARGE SUMMARY    Patient ID: Kimberly Hobbs,  MRN: [id number], DOB/AGE: [birth year] 62 y.o.  Admit date: 02/03/2023 Discharge date: 02/11/2023  Primary Care Physician: [name], PA  Primary Cardiologist: Thomasene Ripple, DO  Electrophysiologist: Dr. Elberta Fortis    Primary Diagnosis:  Ventricular tachycardia  Secondary Diagnosis: Persistent atrial fibrillation s/p ablation 08/2022 CAD S/p mechanical AVR and MVR DM2  Allergies  Allergen Reactions   Duloxetine Hcl Other (See Comments)    Behavioral changes, sleep disturbances   Iodinated Contrast Media Other (See Comments)    Patient is unsure of reaction type   Penicillins Other (See Comments)    Immune to drug , does not work per patient      Procedures This Admission:  Electrophysiology study 02/04/2023 with WCT upon presentation which was revealed to be AF that converted to NSR. Otherwise negative EP study. cMRI 02/05/23 Normal RV size with moderate systolic dysfunction (EF 39%). There is regional RV dyskinesis near apex. Given regional RV dyskinesis and RVEF <40%, would meet 1 major imaging criteria for ARVC (though no RV dilatation) Normal LV size with mild systolic dysfunction (EF 40%) RV insertion site LGE, which is a nonspecific scar finding often seen in setting of elevated pulmonary pressures   Dilated main pulmonary artery measuring 33mm 3.  Implantation of a Medtronic dual chamber ICD on 02/10/2023 by Dr. Elberta Fortis.  The patient received a Medtronic Cobalt XT DR [id number] with Medtronic CapSureFix [id number] [id number] right atrial lead and Medtronic Sprint Quattro Secure [id number] right ventricular lead. There were no post procedure complications 4.  CXR on 02/11/23 demonstrated no pneumothorax status post device implantation.     Brief HPI: Kimberly Hobbs is a 62 y.o. female was admitted 12/16 for [facility] requiring defib x 2. Taken for EPS which failed to induce VT.     and  consulted by electrophysiology  for consideration of ICD implantation.  Past medical history includes above.  The patient has persistent LV dysfunction despite guideline directed therapy.  Risks, benefits, and alternatives to ICD implantation were reviewed with the patient who wished to proceed.   Hospital Course:  The patient was admitted for [facility] requiring DCCV x 2. No underlying flutter waves were seen when adenosine was used. Felt to be VT vs AFL. EPS 12/17 failed to induce VT but showed AF that converted to NSR. She went on to have further WCT/VT as amiodarone was loaded.   cMRI with EF 40% and RV insertion site LGE; though echo was read as 55-60%.   Not felt to be VT ablation candidate with multiple mechanical valves. In setting of recurrent VT pt was optimized on IV amidoarone, and underwent implantation of a Medtronic dual chamber ICD with details as outlined above. They were monitored on telemetry overnight which demonstrated NSR .  Left chest was without hematoma or ecchymosis.  The device was interrogated and found to be functioning normally.  CXR was obtained and demonstrated no pneumothorax status post device implantation..  Wound care, arm mobility, and restrictions were reviewed with the patient.  The patient was examined and considered stable for discharge to home. See plans for Lovenox and close INR follow up as below with INR 1.4 on day of discahrge.   The patient's discharge medications include an ACE-I/ARB/ARNI Sherryll Burger) and beta blocker (Beta Blocker).   Anticoagulation resumption This patient Onesty Clair require Lovenox and coumadin given mechanical valves. Understands there is an increased risk of bleeding.  She Adam Demary stop  Lovenox shots once INR is > 2.0 by her home device, which she Tishanna Dunford check daily until that time.    Once INR is at her goal, she Malakhai Beitler contact coumadin clinic for further instructions of getting back on her chronic regimen.   Reviewed [place] guidelines of no driving x 6  months after appropriately treated VT.   Physical Exam: Vitals:   02/10/23 1853 02/10/23 2019 02/11/23 0445 02/11/23 0725  BP:  104/68 111/64 125/60  Pulse: 80 90  (!) 49  Resp: 15 16 18 16   Temp: 98 F (36.7 C) 98.2 F (36.8 C) 98 F (36.7 C) 98.9 F (37.2 C)  TempSrc: Oral Oral Oral Oral  SpO2: 95% 96%  93%  Weight:      Height:        GEN- NAD. A&O x 3.  HEENT: Normocephalic, atraumatic Lungs- CTAB, normal effort.  Heart- RRR. No M/G/R.  GI- Soft, NT, ND.  Extremities- No clubbing, cyanosis, or edema Skin- Warm and dry, no rash or lesion. ICD site stable.   Discharge Medications:  Allergies as of 02/11/2023       Reactions   Duloxetine Hcl Other (See Comments)   Behavioral changes, sleep disturbances   Iodinated Contrast Media Other (See Comments)   Patient is unsure of reaction type   Penicillins Other (See Comments)   Immune to drug , does not work per patient        Medication List     STOP taking these medications    diltiazem 30 MG tablet Commonly known as: Cardizem   Farxiga 5 MG Tabs tablet Generic drug: dapagliflozin propanediol   losartan 50 MG tablet Commonly known as: COZAAR   Magnesium Oxide 400 MG Caps Replaced by: magnesium oxide 400 (240 Mg) MG tablet   potassium chloride SA 20 MEQ tablet Commonly known as: KLOR-CON M   spironolactone 50 MG tablet Commonly known as: ALDACTONE   vortioxetine HBr 5 MG Tabs tablet Commonly known as: TRINTELLIX       TAKE these medications    Accu-Chek Guide test strip Generic drug: glucose blood Use as instructed   Accu-Chek Guide w/Device Kit Check glucose every morning   Accu-Chek Softclix Lancets lancets Use as instructed   acetaminophen 325 MG tablet Commonly known as: TYLENOL Take 2 tablets (650 mg total) by mouth every 4 (four) hours as needed for headache or mild pain (pain score 1-3).   albuterol 108 (90 Base) MCG/ACT inhaler Commonly known as: VENTOLIN HFA Inhale 1-2 puffs  into the lungs every 6 (six) hours as needed for wheezing or shortness of breath.   amiodarone 200 MG tablet Commonly known as: PACERONE Take 2 tablets (400 mg total) by mouth 2 (two) times daily for 5 days, THEN 1 tablet (200 mg total) daily. Start taking on: February 11, 2023   aspirin EC 81 MG tablet Take 81 mg by mouth daily.   clonazePAM 0.5 MG tablet Commonly known as: KLONOPIN Take 1.5 tablets (0.75 mg total) by mouth at bedtime.   cyclobenzaprine 10 MG tablet Commonly known as: FLEXERIL Take 1 tablet (10 mg total) by mouth at bedtime as needed for muscle spasms.   diclofenac Sodium 1 % Gel Commonly known as: Voltaren Arthritis Pain Apply 4 g topically 4 (four) times daily. What changed:  when to take this reasons to take this   enoxaparin 100 MG/ML injection Commonly known as: LOVENOX Inject 1 mL (100 mg total) into the skin every 12 (twelve) hours for 7  days.   furosemide 20 MG tablet Commonly known as: LASIX Take 1 tablet (20 mg total) by mouth daily. Start taking on: February 12, 2023 What changed: when to take this   gabapentin 100 MG capsule Commonly known as: NEURONTIN Take 1 capsule (100 mg total) by mouth at bedtime.   HYDROcodone-acetaminophen 7.5-325 MG tablet Commonly known as: NORCO Take 1 tablet by mouth every 12 (twelve) hours as needed for moderate pain (pain score 4-6).   isosorbide mononitrate 60 MG 24 hr tablet Commonly known as: IMDUR Take 1 tablet (60 mg total) by mouth daily.   magnesium oxide 400 (240 Mg) MG tablet Commonly known as: MAG-OX Take 1 tablet (400 mg total) by mouth daily. Start taking on: February 12, 2023 Replaces: Magnesium Oxide 400 MG Caps   metFORMIN 500 MG tablet Commonly known as: GLUCOPHAGE Take 1 tablet (500 mg total) by mouth 2 (two) times daily with a meal.   metoprolol succinate 100 MG 24 hr tablet Commonly known as: TOPROL-XL Take 1 tablet (100 mg total) by mouth 2 (two) times daily. Take with or  immediately following a meal. What changed:  medication strength how much to take additional instructions   montelukast 10 MG tablet Commonly known as: SINGULAIR Take 1 tablet (10 mg total) by mouth daily.   nitroGLYCERIN 0.4 MG SL tablet Commonly known as: NITROSTAT DISSOLVE 1 TABLET UNDER THE TONGUE EVERY 5 MINUTES FOR 3 DOSES AS NEEDED FOR CHEST PAIN What changed: See the new instructions.   ondansetron 8 MG tablet Commonly known as: ZOFRAN Take 1 tablet (8 mg total) by mouth every 8 (eight) hours as needed for nausea or vomiting.   rosuvastatin 40 MG tablet Commonly known as: CRESTOR Take 1 tablet (40 mg total) by mouth daily.   sacubitril-valsartan 24-26 MG Commonly known as: ENTRESTO Take 1 tablet by mouth 2 (two) times daily.   Stiolto Respimat 2.5-2.5 MCG/ACT Aers Generic drug: Tiotropium Bromide-Olodaterol Inhale 2 puffs into the lungs daily.   warfarin 5 MG tablet Commonly known as: COUMADIN Take as directed. If you are unsure how to take this medication, talk to your nurse or doctor. Original instructions: As directed after hospital. Take 5mg  (one tablet) once daily on Tuesdays, Thursdays, Saturdays, and Sundays. To be taken in addition to warfarin 7.5mg  once daily on Mondays, Wednesdays, and Fridays. What changed: additional instructions        Disposition: Home with usual follow up as in AVS  Duration of Discharge Encounter: Greater than 30 minutes including physician time.  Dustin Flock, PA-C  02/11/2023 9:39 AM    I have seen and examined this patient with Otilio Saber.  Agree with above, note added to reflect my findings.  Patient admitted to the hospital with wide-complex tachycardia and atrial fibrillation.  She was found to be in ventricular tachycardia.  She has been loaded on amiodarone.  Ventricular tachycardia burden significantly reduced.  She is now post Medtronic ICD.  Device functioning appropriately.  Esta Carmon have her  follow-up in clinic.  Discussed with her [place] driving restrictions of 6 months.  GEN: Well nourished, well developed, in no acute distress  HEENT: normal  Neck: no JVD, carotid bruits, or masses Cardiac: Irregular; no murmurs, rubs, or gallops,no edema  Respiratory:  clear to auscultation bilaterally, normal work of breathing GI: soft, nontender, nondistended, + BS MS: no deformity or atrophy  Skin: warm and dry, device site well healed Neuro:  Strength and sensation are intact Psych: euthymic mood,  full affect     Kyara Boxer M. Parris Cudworth MD 02/11/2023 10:57 AM

## 2023-02-13 NOTE — Telephone Encounter (Signed)
Transmission received 02/12/2023.

## 2023-02-14 ENCOUNTER — Telehealth: Payer: Self-pay

## 2023-02-14 NOTE — Transitions of Care (Post Inpatient/ED Visit) (Signed)
02/14/2023  Name: Kimberly Hobbs MRN: [id number] DOB: 06-02-1960  Today's TOC FU Call Status: Today's TOC FU Call Status:: Successful TOC FU Call Completed TOC FU Call Complete Date: 02/14/23 Patient's Name and Date of Birth confirmed.  Transition Care Management Follow-up Telephone Call Date of Discharge: 02/11/23 Discharge Facility: [facility]) Type of Discharge: Inpatient Admission Primary Inpatient Discharge Diagnosis:: Ventricular Tachycardia How have you been since you were released from the hospital?: Better Any questions or concerns?: No  Items Reviewed: Did you receive and understand the discharge instructions provided?: Yes Medications obtained,verified, and reconciled?: Yes (Medications Reviewed) Any new allergies since your discharge?: No Dietary orders reviewed?: No Do you have support at home?: Yes People in Home: significant other Name of Support/Comfort Primary Source: Kimberly Maduro  Medications Reviewed Today: Medications Reviewed Today     Reviewed by Jodelle Gross, RN (Case Manager) on 02/14/23 at [id number]  Med List Status: <None>   Medication Order Taking? Sig Documenting Provider Last Dose Status Informant  Accu-Chek Softclix Lancets lancets [id number] Yes Use as instructed  Patient taking differently: 1 each by Other route See admin instructions. Use as instructed   Nche, [name], NP Taking Active Self, Pharmacy Records  acetaminophen (TYLENOL) 325 MG tablet [id number] Yes Take 2 tablets (650 mg total) by mouth every 4 (four) hours as needed for headache or mild pain (pain score 1-3). [name], PA-C Taking Active   albuterol (VENTOLIN HFA) 108 (90 Base) MCG/ACT inhaler [id number] Yes Inhale 1-2 puffs into the lungs every 6 (six) hours as needed for wheezing or shortness of breath. [provider] Taking Active Self, Pharmacy Records  amiodarone (PACERONE) 200 MG tablet [id number] Yes Take 2 tablets (400 mg total) by mouth 2 (two)  times daily for 5 days, THEN 1 tablet (200 mg total) daily. [name], PA-C Taking Active   aspirin EC 81 MG tablet [id number] Yes Take 81 mg by mouth daily.  [provider] Taking Active Self, Pharmacy Records  Blood Glucose Monitoring Suppl (ACCU-CHEK GUIDE) w/Device KIT [id number] Yes Check glucose every morning  Patient taking differently: 1 each by Other route See admin instructions. Check glucose every morning   Nche, [name], NP Taking Active Self, Pharmacy Records  clonazePAM (KLONOPIN) 0.5 MG tablet [id number] Yes Take 1.5 tablets (0.75 mg total) by mouth at bedtime. [name], PA Taking Active Self, Pharmacy Records  cyclobenzaprine (FLEXERIL) 10 MG tablet [id number] No Take 1 tablet (10 mg total) by mouth at bedtime as needed for muscle spasms.  Patient not taking: Reported on 02/14/2023   [name], MD Not Taking Active Self, Pharmacy Records           Med Note Rayburn Felt Feb 03, 2023  7:59 PM) Active order, no recent need/use  diclofenac Sodium (VOLTAREN ARTHRITIS PAIN) 1 % GEL [id number] No Apply 4 g topically 4 (four) times daily.  Patient not taking: Reported on 02/14/2023   [name], MD Not Taking Active Self, Pharmacy Records           Med Note Rayburn Felt Feb 03, 2023  7:59 PM) Active order, no recent need/use   enoxaparin (LOVENOX) 100 MG/ML injection [id number] No Inject 1 mL (100 mg total) into the skin every 12 (twelve) hours for 7 days.  Patient not taking: Reported on 02/14/2023   [name], PA-C Not Taking Active            Med Note (  Jodelle Gross   Fri Feb 14, 2023  9:38 AM) Patient had to use one time and then her INR was over 2 and she transitioned to warfarin  furosemide (LASIX) 20 MG tablet [id number] Yes Take 1 tablet (20 mg total) by mouth daily. [name], PA-C Taking Active   gabapentin (NEURONTIN) 100 MG capsule [id number] Yes Take 1 capsule (100 mg total)  by mouth at bedtime. [name], PA Taking Active Self, Pharmacy Records  glucose blood (ACCU-CHEK GUIDE) test strip [id number] Yes Use as instructed  Patient taking differently: 1 each by Other route as needed for other (Glucose chesk). Use as instructed   Nche, [name], NP Taking Active Self, Pharmacy Records  HYDROcodone-acetaminophen [facility]) 7.5-325 MG tablet [id number] Yes Take 1 tablet by mouth every 12 (twelve) hours as needed for moderate pain (pain score 4-6). [name], MD Taking Active Self, Pharmacy Records           Med Note Neil Crouch, SEBASTIAN   Mon Feb 03, 2023  8:00 PM)    isosorbide mononitrate (IMDUR) 60 MG 24 hr tablet [id number] Yes Take 1 tablet (60 mg total) by mouth daily. [name], [name], MD Taking Active Self, Pharmacy Records  magnesium oxide (MAG-OX) 400 (240 Mg) MG tablet [id number] Yes Take 1 tablet (400 mg total) by mouth daily. [name], PA-C Taking Active   metFORMIN (GLUCOPHAGE) 500 MG tablet [id number] Yes Take 1 tablet (500 mg total) by mouth 2 (two) times daily with a meal. [name], PA Taking Active Self, Pharmacy Records  metoprolol succinate (TOPROL-XL) 100 MG 24 hr tablet [id number] Yes Take 1 tablet (100 mg total) by mouth 2 (two) times daily. Take with or immediately following a meal. [name], PA-C Taking Active   montelukast (SINGULAIR) 10 MG tablet [id number] Yes Take 1 tablet (10 mg total) by mouth daily. Charlott Holler, MD Taking Active Self, Pharmacy Records  nitroGLYCERIN (NITROSTAT) 0.4 MG SL tablet [id number] No DISSOLVE 1 TABLET UNDER THE TONGUE EVERY 5 MINUTES FOR 3 DOSES AS NEEDED FOR CHEST PAIN  Patient not taking: Reported on 02/14/2023   Elder Negus, MD Not Taking Active Self, Pharmacy Records           Med Note Consepcion Hearing   Mon Feb 03, 2023  8:04 PM) Active order, no recent use  ondansetron (ZOFRAN) 8 MG tablet [id number] No Take 1 tablet (8 mg total) by mouth every 8  (eight) hours as needed for nausea or vomiting.  Patient not taking: Reported on 02/14/2023   [name], PA Not Taking Active Self, Pharmacy Records           Med Note Rayburn Felt Feb 03, 2023  8:04 PM) Active order, no recent need/use  rosuvastatin (CRESTOR) 40 MG tablet [id number] Yes Take 1 tablet (40 mg total) by mouth daily. Elder Negus, MD Taking Active Self, Pharmacy Records  sacubitril-valsartan [facility]) 24-26 [place] [id number] Yes Take 1 tablet by mouth 2 (two) times daily. [name], PA-C Taking Active   Tiotropium Bromide-Olodaterol (STIOLTO RESPIMAT) 2.5-2.5 MCG/ACT AERS [id number] Yes Inhale 2 puffs into the lungs daily. Charlott Holler, MD Taking Active Self, Pharmacy Records  warfarin (COUMADIN) 5 MG tablet [id number] Yes As directed after hospital. Take 5mg  (one tablet) once daily on Tuesdays, Thursdays, Saturdays, and Sundays. To be taken in addition to warfarin 7.5mg  once daily on Mondays, Wednesdays, and Fridays. [name], PA-C Taking Active  Home Care and Equipment/Supplies: Were Home Health Services Ordered?: No Any new equipment or medical supplies ordered?: No  Functional Questionnaire: Do you need assistance with bathing/showering or dressing?: No Do you need assistance with meal preparation?: No Do you need assistance with eating?: No Do you have difficulty maintaining continence: No Do you need assistance with getting out of bed/getting out of a chair/moving?: No Do you have difficulty managing or taking your medications?: No  Follow up appointments reviewed: PCP Follow-up appointment confirmed?: NA Specialist Hospital Follow-up appointment confirmed?: Yes Date of Specialist follow-up appointment?: 02/26/23 Follow-Up Specialty Provider:: Francis Dowse (cardiology) Do you need transportation to your follow-up appointment?: No Do you understand care options if your condition(s) worsen?: Yes-patient  verbalized understanding  SDOH Interventions Today    Flowsheet Row Most Recent Value  SDOH Interventions   Food Insecurity Interventions Intervention Not Indicated  Housing Interventions Intervention Not Indicated  Transportation Interventions Intervention Not Indicated  Utilities Interventions Intervention Not Indicated      Jodelle Gross RN, BSN, CCM RN Care Manager  Transitions of Care  [facility]  [phone]

## 2023-02-18 ENCOUNTER — Encounter: Payer: Self-pay | Admitting: Family Medicine

## 2023-02-18 DIAGNOSIS — F411 Generalized anxiety disorder: Secondary | ICD-10-CM

## 2023-02-18 DIAGNOSIS — F5104 Psychophysiologic insomnia: Secondary | ICD-10-CM

## 2023-02-18 NOTE — Addendum Note (Signed)
Addended by: Saralyn Pilar on: 02/18/2023 04:26 PM   Modules accepted: Orders

## 2023-02-20 ENCOUNTER — Encounter: Payer: 59 | Admitting: Physical Medicine & Rehabilitation

## 2023-02-23 NOTE — Progress Notes (Addendum)
 Cardiology Office Note:  .   Date:  02/23/2023  ID:  Kimberly Hobbs, DOB May 07, 1960, MRN [id number] PCP: Wallace Joesph LABOR, PA  [facility] Providers Cardiologist:  Dub Huntsman, DO Electrophysiologist:  Will Gladis Norton, MD  Sleep Medicine:  [name], MD {  History of Present Illness: .   Kassidy Dockendorf is a 63 y.o. female w/PMHx of CAD/VHD (Rheumatic) (s/p CABG mechanical AVR/MVR 2018), CKD (III), HTN, HLD, DM, and AFib  Nov 2024, she was taken off amiodarone  in anticipation of starting tikosyn. Sought attention in the ER 01/24/23, palpitations, SOB, lightheaded, found in a WCT failed to break with adenosine  and no flutter wves noted> sedated and DCCV to SR Suspected to be VT EPS was NEGATIVE for inducible VT Had both rapid AFib and VT during her stay given amiodarone  IV cMRI noted  LVEF 40%, RV insertion site LGE (non-specific scar finding in setting of elevated pulmonary pressures), dilated main pulmonary artery  Continued to have recurrent VT amiodarone  gtt continued ICD implant delayed until rhythm settled ICD implanted 02/10/23 Discharged 02/11/23 INR was 1.4 > lovenox /warfarin for home LHC was discussed, not pursued, Trops were negative and discussion likely scar mediated  Today's visit is scheduled as her wound check visit  ROS:   She is hoping to be able to shower! She feels poorly in AFib, tired, weak, thinks she had a day a few days ago that she had a couple hours or so of normal rhythm No CP No near syncope or syncope No bleeding Uses an extra pill of lasix  about 3x a month or so, not regularly She can tell when she is getting volume on > took an extra 20mg  of lasix  last night > brisk increase in urine with it   Device information MDT dual chamber ICD implanted 02/10/23  Arrhythmia/AAD hx PVI ablation 09/05/22 VT hospitalization Dec 2024, amiodarone  resumed NOT a VT ablation candidate gven mechanical AVR/(and MVR)  Studies Reviewed: SABRA     EKG not done today  DEVICE interrogation done today and reviewed by myself Battery and lead measurements are good AF burden  No VT EGMs reviewed As recent at yesterday note her AFib is paroxysmal. Clear evidence of SR at the termination of her AF events yesterday Acute uutputs remain  02/04/23: EPS CONCLUSIONS:  1.  Wide-complex tachycardia upon presentation.  2.  Atrial fibrillation cardioverted to sinus rhythm 3.  Negative EP study   02/03/23: TTE 1. Left ventricular ejection fraction, by estimation, is 55 to 60%. The  left ventricle has normal function. The left ventricle has no regional  wall motion abnormalities. Left ventricular diastolic parameters are  indeterminate.   2. Right ventricular systolic function is mildly reduced. The right  ventricular size is normal.   3. Left atrial size was mildly dilated.   4. The mitral valve has been repaired/replaced. No evidence of mitral  valve regurgitation. No evidence of mitral stenosis. The mean mitral valve  gradient is 3.0 mmHg. Echo findings are consistent with normal structure  and function of the mitral valve  prosthesis.   5. The aortic valve has been repaired/replaced. There is moderate  calcification of the aortic valve. Aortic valve regurgitation is not  visualized. Mild aortic valve stenosis. Aortic valve area, by VTI measures  1.34 cm. Aortic valve mean gradient  measures 19.5 mmHg. Aortic valve Vmax measures 3.00 m/s.   6. The inferior vena cava is normal in size with greater than 50%  respiratory variability, suggesting right  atrial pressure of 3 mmHg.   Conclusion(s)/Recommendation(s): Gradient through mechanical aortic valve  mildly elevated but no change since echo 2/24.   09/05/22: EPS/ablation CONCLUSIONS: 1. Atrial fibrillation upon presentation.   2. Successful electrical isolation and anatomical encircling of all four pulmonary veins with radiofrequency current.  A WACA approach was used 3.  Additional left atrial ablation was performed with a standard box lesion created along the posterior wall of the left atrium 4. Atrial fibrillation successfully cardioverted to sinus rhythm. 5. No early apparent complications.   Risk Assessment/Calculations:    Physical Exam:   VS:  LMP 06/20/2015    Wt Readings from Last 3 Encounters:  02/08/23 210 lb 14.4 oz (95.7 kg)  01/24/23 208 lb 9.6 oz (94.6 kg)  01/23/23 212 lb (96.2 kg)    GEN: Well nourished, well developed in no acute distress NECK: No JVD; No carotid bruits CARDIAC: irreg-irreg, no murmurs, rubs, gallops RESPIRATORY:  CTA b/l without rales, wheezing or rhonchi  ABDOMEN: Soft, non-tender, non-distended EXTREMITIES: No edema; No deformity   ICD site: steri strips removed without difficulty Wound edges are well approximated No erythema, no edema, tenderness No drainage, bleeding or hematoma  ASSESSMENT AND PLAN: .    ICD Well healed No signs of infection Remaining LUE restrictions reviewed Acute implant outputs remain  VT Back on amiodarone  None noted since implant  Persistent AFib CHA21DS2Vasc is 5, on warfarin She inquires about a repeat ablation  INR yesterday 3.6 She was in/out of AF yesterday Increase amiodarone  200mg  BID and have her back in a couple weeks I have asked that she have weekly INRs for now, incase we need to plan DCCV  Labs today  CAD H/o CABG Rheumatic VHD H/o mechanical AVR/MVR Stable function by last echo Warfarin managed with her our coumadin  clinic in [place]  6.  Mild CM by her MRI 40%, by echo 55-60% not volume OL by exam, OptiVol is pending, took a PRN lasix  last night f/u with Dr. Sheena  7.  Secondary hypercoagulable state     Dispo: 2 weeks with EP APP to manage amiodarone /revisit AFib burden, management plan   Signed, Charlies Macario Arthur, PA-C

## 2023-02-24 MED ORDER — BUSPIRONE HCL 10 MG PO TABS: 10.0000 mg | ORAL_TABLET | Freq: Two times a day (BID) | ORAL | 2 refills | Status: DC

## 2023-02-24 NOTE — Addendum Note (Signed)
 Addended by: Saralyn Pilar on: 02/24/2023 01:08 PM   Modules accepted: Orders

## 2023-02-24 NOTE — Addendum Note (Signed)
 Addended by: Saralyn Pilar on: 02/24/2023 01:37 PM   Modules accepted: Orders

## 2023-02-25 ENCOUNTER — Ambulatory Visit: Payer: 59 | Attending: Cardiology

## 2023-02-25 DIAGNOSIS — I480 Paroxysmal atrial fibrillation: Secondary | ICD-10-CM

## 2023-02-25 DIAGNOSIS — Z5181 Encounter for therapeutic drug level monitoring: Secondary | ICD-10-CM | POA: Diagnosis not present

## 2023-02-25 LAB — POCT INR: INR: 3.6 — AB (ref 2.0–3.0)

## 2023-02-25 NOTE — Patient Instructions (Addendum)
 Description   Only take 1/2 tablet today (2.5mg ) today and then continue taking warfarin 5mg  daily except 7.5mg  on Monday, Wednesday, and Friday.  Recheck in 1 week in [place].  Anticoagulation Clinic [phone]  Amio 200mg  daily

## 2023-02-26 ENCOUNTER — Encounter: Payer: Self-pay | Admitting: Physician Assistant

## 2023-02-26 ENCOUNTER — Ambulatory Visit (INDEPENDENT_AMBULATORY_CARE_PROVIDER_SITE_OTHER): Payer: 59 | Admitting: Physician Assistant

## 2023-02-26 VITALS — BP 114/72 | HR 65 | Ht 65.0 in | Wt 206.0 lb

## 2023-02-26 DIAGNOSIS — Z79899 Other long term (current) drug therapy: Secondary | ICD-10-CM | POA: Diagnosis not present

## 2023-02-26 DIAGNOSIS — Z952 Presence of prosthetic heart valve: Secondary | ICD-10-CM | POA: Diagnosis not present

## 2023-02-26 DIAGNOSIS — I255 Ischemic cardiomyopathy: Secondary | ICD-10-CM | POA: Diagnosis not present

## 2023-02-26 DIAGNOSIS — I4819 Other persistent atrial fibrillation: Secondary | ICD-10-CM | POA: Diagnosis not present

## 2023-02-26 DIAGNOSIS — I472 Ventricular tachycardia, unspecified: Secondary | ICD-10-CM | POA: Diagnosis not present

## 2023-02-26 DIAGNOSIS — I2510 Atherosclerotic heart disease of native coronary artery without angina pectoris: Secondary | ICD-10-CM

## 2023-02-26 DIAGNOSIS — D6869 Other thrombophilia: Secondary | ICD-10-CM

## 2023-02-26 DIAGNOSIS — Z5189 Encounter for other specified aftercare: Secondary | ICD-10-CM

## 2023-02-26 DIAGNOSIS — Z95810 Presence of automatic (implantable) cardiac defibrillator: Secondary | ICD-10-CM | POA: Diagnosis not present

## 2023-02-26 DIAGNOSIS — I5022 Chronic systolic (congestive) heart failure: Secondary | ICD-10-CM

## 2023-02-26 LAB — CUP PACEART INCLINIC DEVICE CHECK
Date Time Interrogation Session: 20250108095033
Implantable Lead Connection Status: 753985
Implantable Lead Connection Status: 753985
Implantable Lead Implant Date: 20241223
Implantable Lead Implant Date: 20241223
Implantable Lead Location: 753859
Implantable Lead Location: 753860
Implantable Lead Model: 5076
Implantable Lead Model: 5076
Implantable Pulse Generator Implant Date: 20241223
Lead Channel Pacing Threshold Amplitude: 0.5 V
Lead Channel Pacing Threshold Pulse Width: 0.4 ms
Lead Channel Sensing Intrinsic Amplitude: 2.3 mV
Lead Channel Sensing Intrinsic Amplitude: 20 mV

## 2023-02-26 LAB — COMPREHENSIVE METABOLIC PANEL
ALT: 32 [IU]/L (ref 0–32)
AST: 29 [IU]/L (ref 0–40)
Albumin: 4.7 g/dL (ref 3.9–4.9)
Alkaline Phosphatase: 115 [IU]/L (ref 44–121)
BUN/Creatinine Ratio: 17 (ref 12–28)
BUN: 24 mg/dL (ref 8–27)
Bilirubin Total: 0.5 mg/dL (ref 0.0–1.2)
CO2: 23 mmol/L (ref 20–29)
Calcium: 9.4 mg/dL (ref 8.7–10.3)
Chloride: 102 mmol/L (ref 96–106)
Creatinine, Ser: 1.4 mg/dL — ABNORMAL HIGH (ref 0.57–1.00)
Globulin, Total: 2.4 g/dL (ref 1.5–4.5)
Glucose: 96 mg/dL (ref 70–99)
Potassium: 3.9 mmol/L (ref 3.5–5.2)
Sodium: 141 mmol/L (ref 134–144)
Total Protein: 7.1 g/dL (ref 6.0–8.5)
eGFR: 43 mL/min/{1.73_m2} — ABNORMAL LOW (ref >59)

## 2023-02-26 LAB — TSH: TSH: 4 u[IU]/mL (ref 0.450–4.500)

## 2023-02-26 NOTE — Patient Instructions (Addendum)
 Medication Instructions:    START TAKING :  AMIODARONE  200 MG  TWICE A DAY   *If you need a refill on your cardiac medications before your next appointment, please call your pharmacy*   Lab Work:   PLEASE GO DOWN STAIRS FIRST FLOOR  [address] :  CMET AND TSH TODAY    If you have labs (blood work) drawn today and your tests are completely normal, you will receive your results only by: MyChart Message (if you have MyChart) OR A paper copy in the mail If you have any lab test that is abnormal or we need to change your treatment, we will call you to review the results.   Testing/Procedures: NONE ORDERED  TODAY      Follow-Up: At [facility], you and your health needs are our priority.  As part of our continuing mission to provide you with exceptional heart care, we have created designated Provider Care Teams.  These Care Teams include your primary Cardiologist (physician) and Advanced Practice Providers (APPs -  Physician Assistants and Nurse Practitioners) who all work together to provide you with the care you need, when you need it.  We recommend signing up for the patient portal called MyChart.  Sign up information is provided on this After Visit Summary.  MyChart is used to connect with patients for Virtual Visits (Telemedicine).  Patients are able to view lab/test results, encounter notes, upcoming appointments, etc.  Non-urgent messages can be sent to your provider as well.   To learn more about what you can do with MyChart, go to [email or web address].    Your next appointment:   2-3  week(s)  ( CONTACT  CASSIE HALL/ ANGELINE HAMMER FOR EP SCHEDULING ISSUES )   Provider:   You may see the following Advanced Practice Providers on your designated Care Team:   Charlies Arthur, PA-C Michael Andy Tillery, PA-C Daphne Barrack, NP  Other Instructions

## 2023-03-02 DIAGNOSIS — G4733 Obstructive sleep apnea (adult) (pediatric): Secondary | ICD-10-CM | POA: Diagnosis not present

## 2023-03-04 ENCOUNTER — Telehealth: Payer: Self-pay | Admitting: Pharmacist

## 2023-03-04 ENCOUNTER — Ambulatory Visit: Payer: 59 | Admitting: Cardiology

## 2023-03-04 ENCOUNTER — Other Ambulatory Visit: Payer: Self-pay | Admitting: Cardiology

## 2023-03-04 ENCOUNTER — Other Ambulatory Visit: Payer: Self-pay

## 2023-03-04 ENCOUNTER — Ambulatory Visit: Payer: 59

## 2023-03-04 DIAGNOSIS — Z7901 Long term (current) use of anticoagulants: Secondary | ICD-10-CM | POA: Diagnosis not present

## 2023-03-04 DIAGNOSIS — I480 Paroxysmal atrial fibrillation: Secondary | ICD-10-CM | POA: Diagnosis not present

## 2023-03-04 DIAGNOSIS — I4819 Other persistent atrial fibrillation: Secondary | ICD-10-CM

## 2023-03-04 DIAGNOSIS — Z952 Presence of prosthetic heart valve: Secondary | ICD-10-CM | POA: Diagnosis not present

## 2023-03-04 DIAGNOSIS — D6869 Other thrombophilia: Secondary | ICD-10-CM

## 2023-03-04 NOTE — Telephone Encounter (Signed)
 Patient requests lab INR draw

## 2023-03-05 ENCOUNTER — Ambulatory Visit (INDEPENDENT_AMBULATORY_CARE_PROVIDER_SITE_OTHER): Payer: 59

## 2023-03-05 DIAGNOSIS — Z5181 Encounter for therapeutic drug level monitoring: Secondary | ICD-10-CM | POA: Diagnosis not present

## 2023-03-05 LAB — PROTIME-INR
INR: 3.4 — ABNORMAL HIGH (ref 0.9–1.2)
Prothrombin Time: 34.6 s — ABNORMAL HIGH (ref 9.1–12.0)

## 2023-03-05 NOTE — Patient Instructions (Signed)
 Description   Called and spoke with pt. Instructed to take 11.25mg  today and then continue taking warfarin 5mg  daily except 7.5mg  on Monday, Wednesday, and Friday.  Recheck in 1 week in [place].  Anticoagulation Clinic [phone]  Amio 200mg  daily

## 2023-03-07 ENCOUNTER — Encounter: Payer: Self-pay | Admitting: Physical Medicine & Rehabilitation

## 2023-03-07 MED ORDER — HYDROCODONE-ACETAMINOPHEN 7.5-325 MG PO TABS: 1.0000 | ORAL_TABLET | Freq: Two times a day (BID) | ORAL | 0 refills | Status: DC | PRN

## 2023-03-10 ENCOUNTER — Other Ambulatory Visit (HOSPITAL_COMMUNITY): Payer: Self-pay

## 2023-03-10 ENCOUNTER — Encounter: Payer: 59 | Attending: Physical Medicine & Rehabilitation | Admitting: Physical Medicine & Rehabilitation

## 2023-03-10 DIAGNOSIS — G894 Chronic pain syndrome: Secondary | ICD-10-CM | POA: Insufficient documentation

## 2023-03-10 DIAGNOSIS — G2581 Restless legs syndrome: Secondary | ICD-10-CM | POA: Insufficient documentation

## 2023-03-10 DIAGNOSIS — G8929 Other chronic pain: Secondary | ICD-10-CM | POA: Insufficient documentation

## 2023-03-10 DIAGNOSIS — Z5181 Encounter for therapeutic drug level monitoring: Secondary | ICD-10-CM | POA: Insufficient documentation

## 2023-03-10 DIAGNOSIS — Z79891 Long term (current) use of opiate analgesic: Secondary | ICD-10-CM | POA: Insufficient documentation

## 2023-03-10 DIAGNOSIS — M545 Low back pain, unspecified: Secondary | ICD-10-CM | POA: Insufficient documentation

## 2023-03-11 ENCOUNTER — Ambulatory Visit: Payer: 59

## 2023-03-11 ENCOUNTER — Encounter: Payer: Self-pay | Admitting: Cardiology

## 2023-03-11 ENCOUNTER — Other Ambulatory Visit: Payer: Self-pay

## 2023-03-11 MED ORDER — AMIODARONE HCL 200 MG PO TABS: 200.0000 mg | ORAL_TABLET | Freq: Two times a day (BID) | ORAL | 3 refills | Status: DC

## 2023-03-11 NOTE — Telephone Encounter (Signed)
Amiodrone sent to requested Pharmacy. All others are picked up already

## 2023-03-13 DIAGNOSIS — G4733 Obstructive sleep apnea (adult) (pediatric): Secondary | ICD-10-CM | POA: Diagnosis not present

## 2023-03-14 ENCOUNTER — Ambulatory Visit (HOSPITAL_COMMUNITY): Payer: 59 | Admitting: Physician Assistant

## 2023-03-15 DIAGNOSIS — G4733 Obstructive sleep apnea (adult) (pediatric): Secondary | ICD-10-CM | POA: Diagnosis not present

## 2023-03-17 ENCOUNTER — Encounter: Payer: Self-pay | Admitting: Family Medicine

## 2023-03-17 ENCOUNTER — Encounter: Payer: Self-pay | Admitting: Cardiology

## 2023-03-17 DIAGNOSIS — R051 Acute cough: Secondary | ICD-10-CM

## 2023-03-17 MED ORDER — GUAIFENESIN 200 MG PO TABS: 200.0000 mg | ORAL_TABLET | ORAL | 0 refills | Status: DC | PRN

## 2023-03-17 MED ORDER — BENZONATATE 200 MG PO CAPS: 200.0000 mg | ORAL_CAPSULE | Freq: Every evening | ORAL | 0 refills | Status: DC

## 2023-03-17 NOTE — Addendum Note (Signed)
Addended by: Saralyn Pilar on: 03/17/2023 03:41 PM   Modules accepted: Orders

## 2023-03-18 ENCOUNTER — Ambulatory Visit (HOSPITAL_BASED_OUTPATIENT_CLINIC_OR_DEPARTMENT_OTHER)
Admission: EM | Admit: 2023-03-18 | Discharge: 2023-03-18 | Disposition: A | Discharge status: Home / Self Care | Payer: 59 | Attending: Physician Assistant | Admitting: Physician Assistant

## 2023-03-18 ENCOUNTER — Encounter (HOSPITAL_BASED_OUTPATIENT_CLINIC_OR_DEPARTMENT_OTHER): Payer: Self-pay

## 2023-03-18 ENCOUNTER — Ambulatory Visit: Payer: 59

## 2023-03-18 ENCOUNTER — Ambulatory Visit (HOSPITAL_BASED_OUTPATIENT_CLINIC_OR_DEPARTMENT_OTHER)
Admit: 2023-03-18 | Discharge: 2023-03-18 | Disposition: A | Discharge status: Home / Self Care | Payer: 59 | Attending: Physician Assistant | Admitting: Physician Assistant

## 2023-03-18 ENCOUNTER — Ambulatory Visit: Payer: 59 | Attending: Cardiology

## 2023-03-18 DIAGNOSIS — J441 Chronic obstructive pulmonary disease with (acute) exacerbation: Secondary | ICD-10-CM

## 2023-03-18 DIAGNOSIS — R0602 Shortness of breath: Secondary | ICD-10-CM | POA: Diagnosis not present

## 2023-03-18 DIAGNOSIS — R059 Cough, unspecified: Secondary | ICD-10-CM | POA: Diagnosis not present

## 2023-03-18 DIAGNOSIS — I480 Paroxysmal atrial fibrillation: Secondary | ICD-10-CM | POA: Diagnosis not present

## 2023-03-18 DIAGNOSIS — Z5181 Encounter for therapeutic drug level monitoring: Secondary | ICD-10-CM

## 2023-03-18 DIAGNOSIS — R918 Other nonspecific abnormal finding of lung field: Secondary | ICD-10-CM | POA: Diagnosis not present

## 2023-03-18 LAB — POCT INR: INR: 4.5 — AB (ref 2.0–3.0)

## 2023-03-18 LAB — POC COVID19/FLU A&B COMBO
Covid Antigen, POC: NEGATIVE
Influenza A Antigen, POC: NEGATIVE
Influenza B Antigen, POC: NEGATIVE

## 2023-03-18 MED ORDER — DOXYCYCLINE HYCLATE 100 MG PO CAPS: 100.0000 mg | ORAL_CAPSULE | Freq: Two times a day (BID) | ORAL | 0 refills | Status: DC

## 2023-03-18 MED ORDER — PREDNISONE 20 MG PO TABS: 40.0000 mg | ORAL_TABLET | Freq: Every day | ORAL | 0 refills | Status: DC

## 2023-03-18 MED ORDER — IPRATROPIUM-ALBUTEROL 0.5-2.5 (3) MG/3ML IN SOLN
3.0000 mL | Freq: Once | RESPIRATORY_TRACT | Status: AC
  Administered 2023-03-18: 3 mL via RESPIRATORY_TRACT

## 2023-03-18 NOTE — Patient Instructions (Addendum)
Description   Eat greens and HOLD today's dose and take your normal 7.5mg  tomorrow and then take 5mg  daily until coumadin clinic appt.  Normal dose: warfarin 5mg  daily except 7.5mg  on Monday, Wednesday, and Friday.  Recheck in 1 week  Anticoagulation Clinic [phone]  Amio 200mg  daily

## 2023-03-18 NOTE — ED Provider Notes (Addendum)
[facility]    CSN: [id number] Arrival date & time: 03/18/23  0813      History   Chief Complaint Chief Complaint  Patient presents with   Cough   Nasal Congestion   Fever    HPI Kimberly Hobbs is a 63 y.o. female.   Patient presents today with a 2 to 3-day history of URI symptoms.  She reports cough, wheezing, chest tightness, headache, fever.  Denies any chest pain, nausea, vomiting, diarrhea.  She has tried Tylenol and over-the-counter cough medication without improvement of symptoms.  She reports that her significant other has had a cough for several weeks but denies additional sick contacts.  She has had COVID several years ago but has not had COVID-19 vaccinations.  She is otherwise up-to-date on age-appropriate immunizations including influenza and pneumonia.  She denies any recent antibiotics or steroids.  She does have a history of COPD and has been taking albuterol regularly with last dose yesterday afternoon.  This provided improvement of symptoms temporarily but she continues to have wheezing and shortness of breath.  She does take Stiolto Respimat for maintenance and has been compliant with this medication.  Denies recent hospitalization for COPD.  She is concerned because she also has a history of cardiovascular disease and wants to ensure that she does not have a viral illness such as influenza or COVID.    Past Medical History:  Diagnosis Date   Allergy ?   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 01/01/2017    Patient Active Problem List   Diagnosis Date Noted   Ventricular tachycardia (HCC) 02/03/2023   VT (ventricular tachycardia) (HCC) 02/03/2023   Psychophysiological insomnia 01/23/2023   Hypercoagulable state due to persistent atrial fibrillation (HCC) 10/03/2022   Restless leg 10/01/2022   OSA (obstructive sleep apnea) 09/27/2022   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/2022   Iron deficiency anemia 07/31/2022   DDD (degenerative disc disease), lumbosacral 03/26/2022   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   Chronic right-sided low back pain without sciatica 02/26/2022   Cervical strain 12/20/2021   Sensorineural hearing loss (SNHL) of both ears 10/24/2021   Hemorrhoids 05/18/2021   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   Paroxysmal atrial fibrillation (HCC) 03/28/2021   GAD (generalized anxiety disorder) 02/01/2021   DM (diabetes mellitus) (HCC) 02/01/2021   COPD mixed type (HCC) 07/07/2019   Pulmonary hypertension, unspecified (HCC) 07/21/2018   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/05/2018   Long term (current) use of anticoagulants 01/08/2017   H/O mitral valve replacement with mechanical #25 mechanical SJM 01/01/2017) 01/01/2017   PVCs (premature ventricular contractions) 11/17/2015   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    Essential hypertension 06/29/2015    Past Surgical History:  Procedure Laterality Date   ATRIAL FIBRILLATION ABLATION N/A 09/05/2022  Procedure: ATRIAL FIBRILLATION ABLATION;  Surgeon: Regan Lemming, MD;  Location: [facility];  Service: Cardiovascular;  Laterality: N/A;   CARDIAC CATHETERIZATION     CARDIAC VALVE REPLACEMENT     CARDIOVERSION N/A 04/02/2022   Procedure: CARDIOVERSION;  Surgeon: Elder Negus, MD;  Location: [facility];  Service:  Cardiovascular;  Laterality: N/A;   CARDIOVERSION N/A 04/27/2022   Procedure: CARDIOVERSION;  Surgeon: Elder Negus, MD;  Location: [facility];  Service: Cardiovascular;  Laterality: N/A;   CHOLECYSTECTOMY     CORONARY ARTERY BYPASS GRAFT     CORONARY/GRAFT ANGIOGRAPHY N/A 08/18/2018   Procedure: CORONARY/GRAFT ANGIOGRAPHY;  Surgeon: Elder Negus, MD;  Location: [facility];  Service: Cardiovascular;  Laterality: N/A;   ELECTROPHYSIOLOGY STUDY N/A 02/04/2023   Procedure: ELECTROPHYSIOLOGY STUDY;  Surgeon: Regan Lemming, MD;  Location: [facility];  Service: Cardiovascular;  Laterality: N/A;   ICD IMPLANT N/A 02/10/2023   Procedure: ICD IMPLANT;  Surgeon: Regan Lemming, MD;  Location[facility] INVASIVE CV LAB;  Service: Cardiovascular;  Laterality: N/A;   LEG SURGERY     "metal plate in leg"   RIGHT HEART CATH AND CORONARY/GRAFT ANGIOGRAPHY N/A 07/21/2018   Procedure: RIGHT HEART CATH AND CORONARY/GRAFT ANGIOGRAPHY;  Surgeon: Elder Negus, MD;  Location: [facility];  Service: Cardiovascular;  Laterality: N/A;   TEE WITHOUT CARDIOVERSION N/A 07/27/2015   Procedure: TRANSESOPHAGEAL ECHOCARDIOGRAM (TEE);  Surgeon: Thurmon Fair, MD;  Location[facility] ENDOSCOPY;  Service: Cardiovascular;  Laterality: N/A;   TEE WITHOUT CARDIOVERSION N/A 07/21/2018   Procedure: TRANSESOPHAGEAL ECHOCARDIOGRAM (TEE);  Surgeon: Elder Negus, MD;  Location: [facility] ENDOSCOPY;  Service: Cardiovascular;  Laterality: N/A;   TONSILLECTOMY AND ADENOIDECTOMY      OB History   No obstetric history on file.      Home Medications    Prior to Admission medications   Medication Sig Start Date End Date Taking? Authorizing Provider  doxycycline (VIBRAMYCIN) 100 MG capsule Take 1 capsule (100 mg total) by mouth 2 (two) times daily. 03/18/23  Yes Tristin Vandeusen K, PA-C  predniSONE (DELTASONE) 20 MG tablet Take 2 tablets (40 mg total) by mouth daily for 5 days. 03/18/23  03/23/23 Yes Aariyana Manz K, PA-C  Accu-Chek Softclix Lancets lancets Use as instructed Patient taking differently: 1 each by Other route See admin instructions. Use as instructed 05/23/22   Nche, [name], NP  acetaminophen (TYLENOL) 325 MG tablet Take 2 tablets (650 mg total) by mouth every 4 (four) hours as needed for headache or mild pain (pain score 1-3). 02/11/23   [name], PA-C  albuterol (VENTOLIN HFA) 108 (90 Base) MCG/ACT inhaler Inhale 1-2 puffs into the lungs every 6 (six) hours as needed for wheezing or shortness of breath.    [provider]  amiodarone (PACERONE) 200 MG tablet Take 1 tablet (200 mg total) by mouth 2 (two) times daily. 03/11/23   Tobb, Kardie, DO  aspirin EC 81 MG tablet Take 81 mg by mouth daily.     [provider]  benzonatate (TESSALON) 200 MG capsule Take 1 capsule (200 mg total) by mouth at bedtime. 03/17/23   Saralyn Pilar A, PA  Blood Glucose Monitoring Suppl (ACCU-CHEK GUIDE) w/Device KIT Check glucose every morning Patient taking differently: 1 each by Other route See admin instructions. Check glucose every morning 05/21/22   Nche, [name], NP  busPIRone (BUSPAR) 10 MG tablet Take 1 tablet (10 mg total) by mouth 2 (  two) times daily. 02/24/23   [name], PA  clonazePAM (KLONOPIN) 0.5 MG tablet Take 1.5 tablets (0.75 mg total) by mouth at bedtime. 01/23/23   [name], PA  furosemide (LASIX) 20 MG tablet Take 20 mg by mouth 2 (two) times daily.    [provider]  gabapentin (NEURONTIN) 100 MG capsule Take 1 capsule (100 mg total) by mouth at bedtime. 12/23/22   Saralyn Pilar A, PA  glucose blood (ACCU-CHEK GUIDE) test strip Use as instructed Patient taking differently: 1 each by Other route as needed for other (Glucose chesk). Use as instructed 05/21/22   Nche, [name], NP  guaiFENesin 200 MG tablet Take 1 tablet (200 mg total) by mouth every 4 (four) hours as needed. 03/17/23   [name], PA  HYDROcodone-acetaminophen (NORCO) 7.5-325 MG tablet Take 1 tablet by mouth every 12 (twelve) hours as needed for moderate pain (pain score 4-6). 03/07/23   [name], MD  isosorbide mononitrate (IMDUR) 60 MG 24 hr tablet Take 1 tablet (60 mg total) by mouth daily. 09/13/22   [name], [name], MD  magnesium oxide (MAG-OX) 400 (240 Mg) MG tablet Take 1 tablet (400 mg total) by mouth daily. 02/12/23   [name], PA-C  metFORMIN (GLUCOPHAGE) 500 MG tablet Take 1 tablet (500 mg total) by mouth 2 (two) times daily with a meal. 12/23/22   Saralyn Pilar A, PA  metoprolol succinate (TOPROL-XL) 100 MG 24 hr tablet Take 1 tablet (100 mg total) by mouth 2 (two) times daily. Take with or immediately following a meal. 02/11/23   Tillery, Mariam Dollar, PA-C  montelukast (SINGULAIR) 10 MG tablet Take 1 tablet (10 mg total) by mouth daily. 05/27/22 05/27/23  Charlott Holler, MD  rosuvastatin (CRESTOR) 40 MG tablet TAKE 1 TABLET(40 MG) BY MOUTH DAILY 03/04/23   [name], Manish J, MD  sacubitril-valsartan (ENTRESTO) 24-26 MG Take 1 tablet by mouth 2 (two) times daily. 02/11/23   [name], PA-C  Tiotropium Bromide-Olodaterol (STIOLTO RESPIMAT) 2.5-2.5 MCG/ACT AERS Inhale 2 puffs into the lungs daily. 09/03/22   Charlott Holler, MD  warfarin (COUMADIN) 5 MG tablet As directed after hospital. Take 5mg  (one tablet) once daily on Tuesdays, Thursdays, Saturdays, and Sundays. To be taken in addition to warfarin 7.5mg  once daily on Mondays, Wednesdays, and Fridays. 02/11/23   [name], PA-C    Family History Family History  Problem Relation Age of Onset   Cancer Mother    Hypertension Mother    Diabetes Mother    Hyperlipidemia Mother    Heart disease Mother    Cancer Father    Hypertension Father    Diabetes Father    Heart disease Father    Hypertension Sister    Schizophrenia Maternal Grandmother    Hypertension Brother    Hypertension Brother     Cancer Daughter    COPD Daughter    Breast cancer Neg Hx     Social History Social History   Tobacco Use   Smoking status: Former    Current packs/day: 0.00    Average packs/day: 1.6 packs/day for 50.0 years (80.0 ttl pk-yrs)    Types: Cigarettes    Start date: 12/23/1976    Quit date: 12/23/2016    Years since quitting: 6.2    Passive exposure: Past   Smokeless tobacco: Never   Tobacco comments:    Former smoker 01/24/23  Vaping Use   Vaping status: Never Used  Substance Use  Topics   Alcohol use: Never   Drug use: Yes    Types: Hydrocodone     Allergies   Duloxetine hcl, Iodinated contrast media, and Penicillins   Review of Systems Review of Systems  Constitutional:  Positive for activity change and fever. Negative for appetite change and fatigue.  HENT:  Positive for congestion. Negative for sinus pressure, sneezing and sore throat.   Respiratory:  Positive for cough and chest tightness. Negative for shortness of breath and wheezing.   Cardiovascular:  Negative for chest pain.  Gastrointestinal:  Negative for abdominal pain, diarrhea, nausea and vomiting.  Neurological:  Positive for headaches. Negative for dizziness and light-headedness.     Physical Exam Triage Vital Signs ED Triage Vitals  Encounter Vitals Group     BP 03/18/23 0823 134/76     Systolic BP Percentile --      Diastolic BP Percentile --      Pulse Rate 03/18/23 0823 60     Resp 03/18/23 0823 20     Temp 03/18/23 0823 97.9 F (36.6 C)     Temp Source 03/18/23 0823 Temporal     SpO2 03/18/23 0823 97 %     Weight 03/18/23 0824 205 lb 9.6 oz (93.3 kg)     Height --      Head Circumference --      Peak Flow --      Pain Score 03/18/23 0824 0     Pain Loc --      Pain Education --      Exclude from Growth Chart --    No data found.  Updated Vital Signs BP 134/76 (BP Location: Right Arm)   Pulse 60   Temp 97.9 F (36.6 C) (Temporal)   Resp 20   Wt 205 lb 9.6 oz (93.3 kg)   LMP  06/20/2015   SpO2 96%   BMI 34.21 kg/m   Visual Acuity Right Eye Distance:   Left Eye Distance:   Bilateral Distance:    Right Eye Near:   Left Eye Near:    Bilateral Near:     Physical Exam Vitals reviewed.  Constitutional:      General: She is awake. She is not in acute distress.    Appearance: Normal appearance. She is well-developed. She is not ill-appearing.     Comments: Very pleasant female appears stated age in no acute distress sitting comfortably in exam room  HENT:     Head: Normocephalic and atraumatic.     Right Ear: Tympanic membrane, ear canal and external ear normal. Tympanic membrane is not erythematous or bulging.     Left Ear: Tympanic membrane, ear canal and external ear normal. Tympanic membrane is not erythematous or bulging.     Nose:     Right Sinus: No maxillary sinus tenderness or frontal sinus tenderness.     Left Sinus: No maxillary sinus tenderness or frontal sinus tenderness.     Mouth/Throat:     Pharynx: Uvula midline. No oropharyngeal exudate or posterior oropharyngeal erythema.  Cardiovascular:     Rate and Rhythm: Normal rate and regular rhythm.     Heart sounds: Normal heart sounds, S1 normal and S2 normal. No murmur heard. Pulmonary:     Effort: Pulmonary effort is normal.     Breath sounds: Wheezing present. No rhonchi or rales.     Comments: Widespread wheezing throughout lung fields Psychiatric:        Behavior: Behavior is cooperative.  UC Treatments / Results  Labs (all labs ordered are listed, but only abnormal results are displayed) Labs Reviewed  POC COVID19/FLU A&B COMBO - Normal    EKG   Radiology DG Chest 2 View Result Date: 03/18/2023 CLINICAL DATA:  Cough and shortness of breath for 2 days. EXAM: CHEST - 2 VIEW COMPARISON:  February 11, 2023. FINDINGS: The heart size and mediastinal contours are within normal limits. Left-sided defibrillator is unchanged. Status post coronary bypass graft and cardiac valve  repair. Mild bilateral perihilar interstitial densities are noted concerning for possible pulmonary edema or atypical inflammation. The visualized skeletal structures are unremarkable. IMPRESSION: Mild bilateral perihilar interstitial densities are noted concerning for possible pulmonary edema or atypical inflammation. Electronically Signed   By: Lupita Raider M.D.   On: 03/18/2023 09:42    Procedures Procedures (including critical care time)  Medications Ordered in UC Medications  ipratropium-albuterol (DUONEB) 0.5-2.5 (3) MG/3ML nebulizer solution 3 mL (3 mLs Nebulization Given 03/18/23 0911)    Initial Impression / Assessment and Plan / UC Course  I have reviewed the triage vital signs and the nursing notes.  Pertinent labs & imaging results that were available during my care of the patient were reviewed by me and considered in my medical decision making (see chart for details).     Patient is well-appearing, afebrile, nontoxic, nontachycardic.  She was negative for flu and COVID in clinic.  Concern for COPD exacerbation based on increased cough and sputum production.  She was given a DuoNeb with temporary improvement of symptoms in clinic.  She has a nebulizer at home and was encouraged to use this medication every 4-6 hours on a scheduled basis for the next several days then decrease use to as needed thereafter.  Will start prednisone 40 mg for 5 days.  Will also start doxycycline in the hopes that this would be less likely impact her INR.  We discussed that she should avoid prolonged sun exposure while on doxycycline and avoid NSAIDs with prednisone.  She does have an at home INR monitor and was encouraged to monitor this closely because these medications can impact the INR.  If she has any signs of bleeding she is to go to the emergency room but if her INR is abnormal with no signs of bleeding she is to contact her prescribing provider to determine if dose adjustment is required.  Chest  x-ray was obtained that showed bronchial thickening without focal consolidation based on my primary read.  We were waiting for radiologist overread at the time of discharge and we will contact her if this differs and changes our treatment plan.  Recommend close follow-up with her primary care.  We discussed that if anything worsens or changes she needs to go to the emergency room including oxygen saturation below 90%, worsening cough, shortness of breath, chest pain, weakness, nausea/vomiting.  Return precautions given to which she expressed understanding.  Addendum: Chest x-ray read shows mild bibasilar perihilar interstitial densities concerning for pulm edema versus atypical infection.  Given clinical presentation I am more concerned for atypical infection.  She has already been started on doxycycline which is an appropriate medication.  No change to treatment plan.  Final Clinical Impressions(s) / UC Diagnoses   Final diagnoses:  COPD exacerbation [facility])     Discharge Instructions      We are treating you for a COPD exacerbation.  Use your breathing treatment every 4-6 hours as needed for shortness of breath and coughing fits.  I would use it on a regular basis for the next several days and then decrease to as needed thereafter.  Start doxycycline 100 mg twice daily for 10 days.  Start prednisone 40 mg for 5 days.  Do not take NSAIDs with prednisone including aspirin, ibuprofen/Advil, naproxen/Aleve.  Both of these medications can impact her INR so please monitor your INR at home and if this is becoming above the therapeutic range please contact your provider to adjust your dose.  Any signs of bleeding including blood in your stool, blood in your urine, significant bruising please go to the emergency room.  Monitor your oxygen saturation at home and if this is dropping below 90% you need to go to the ER.  If you are not feeling better in a few days or if anything worsens and you have worsening  cough, high fever, chest pain, shortness of breath you need to go to the ER.     ED Prescriptions     Medication Sig Dispense Auth. Provider   predniSONE (DELTASONE) 20 MG tablet Take 2 tablets (40 mg total) by mouth daily for 5 days. 10 tablet Zalen Sequeira K, PA-C   doxycycline (VIBRAMYCIN) 100 MG capsule Take 1 capsule (100 mg total) by mouth 2 (two) times daily. 20 capsule Emberley Kral, Noberto Retort, PA-C      PDMP not reviewed this encounter.   Jeani Hawking, PA-C 03/18/23 1610    Jeani Hawking, PA-C 03/18/23 2059

## 2023-03-18 NOTE — Discharge Instructions (Signed)
We are treating you for a COPD exacerbation.  Use your breathing treatment every 4-6 hours as needed for shortness of breath and coughing fits.  I would use it on a regular basis for the next several days and then decrease to as needed thereafter.  Start doxycycline 100 mg twice daily for 10 days.  Start prednisone 40 mg for 5 days.  Do not take NSAIDs with prednisone including aspirin, ibuprofen/Advil, naproxen/Aleve.  Both of these medications can impact her INR so please monitor your INR at home and if this is becoming above the therapeutic range please contact your provider to adjust your dose.  Any signs of bleeding including blood in your stool, blood in your urine, significant bruising please go to the emergency room.  Monitor your oxygen saturation at home and if this is dropping below 90% you need to go to the ER.  If you are not feeling better in a few days or if anything worsens and you have worsening cough, high fever, chest pain, shortness of breath you need to go to the ER.

## 2023-03-18 NOTE — ED Triage Notes (Signed)
Onset x 2 days. Cough, chest congestion, low grade fever last night.  Patient contacted her primary care physician and prescribed tesselon pearles with no relief. Patient has hx of CHF and concerned her symptoms are fluid related.

## 2023-03-19 ENCOUNTER — Telehealth: Payer: Self-pay | Admitting: Cardiology

## 2023-03-19 NOTE — Telephone Encounter (Signed)
Patient checked INR today and result is 5.3. Please advise.

## 2023-03-19 NOTE — Telephone Encounter (Signed)
Called and spoke with pt. Instructed for pt to HOLD today's dose and then resume dosing instructions that were provided to her yesterday at the coumadin clinic in [place].

## 2023-03-20 ENCOUNTER — Encounter (HOSPITAL_BASED_OUTPATIENT_CLINIC_OR_DEPARTMENT_OTHER): Payer: 59 | Admitting: Physical Medicine & Rehabilitation

## 2023-03-20 ENCOUNTER — Encounter: Payer: Self-pay | Admitting: Physical Medicine & Rehabilitation

## 2023-03-20 VITALS — BP 115/70 | HR 89 | Ht 65.0 in | Wt 203.2 lb

## 2023-03-20 DIAGNOSIS — Z5181 Encounter for therapeutic drug level monitoring: Secondary | ICD-10-CM | POA: Diagnosis not present

## 2023-03-20 DIAGNOSIS — G894 Chronic pain syndrome: Secondary | ICD-10-CM

## 2023-03-20 DIAGNOSIS — M545 Low back pain, unspecified: Secondary | ICD-10-CM | POA: Diagnosis not present

## 2023-03-20 DIAGNOSIS — Z79891 Long term (current) use of opiate analgesic: Secondary | ICD-10-CM | POA: Diagnosis not present

## 2023-03-20 DIAGNOSIS — G8929 Other chronic pain: Secondary | ICD-10-CM | POA: Diagnosis not present

## 2023-03-20 DIAGNOSIS — G2581 Restless legs syndrome: Secondary | ICD-10-CM

## 2023-03-20 MED ORDER — OXYCODONE-ACETAMINOPHEN 5-325 MG PO TABS: 1.0000 | ORAL_TABLET | Freq: Two times a day (BID) | ORAL | 0 refills | Status: DC | PRN

## 2023-03-20 MED ORDER — GABAPENTIN 100 MG PO CAPS: 200.0000 mg | ORAL_CAPSULE | Freq: Every day | ORAL | 5 refills | Status: DC

## 2023-03-20 NOTE — Progress Notes (Signed)
Subjective:    Patient ID: Kimberly Hobbs, female    DOB: 1960-08-12, 63 y.o.   MRN: [id number]  HPI   Kimberly Hobbs is a 63 y.o. year old female  who  has a past medical history of CKD stage 3 due to type 2 diabetes mellitus (HCC) (05/15/2021), COVID-19, Hyperlipidemia, Hypertension, Pain in both lower extremities (07/23/2019), and Rheumatic heart disease.   They are presenting to PM&R clinic as a new patient for pain management evaluation. They were referred by [name] for treatment of low back pain.  She cant stand for long. Has to use a walker.  Pain is severe if she walks without this. Pain started about 2 years ago but is worsening. 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  Pain is mostly on the right side of her lower back. Sometimes both sides. It doesn't shoot down her legs.  Lying down helps the pain.  She reports her activities are very limited by her pain.  She has been taking hydrocodone 5/325 1 pain is very severe. History of prosthetic valves, schedule for ablation for A-fib       Red flag symptoms: No red flags for back pain endorsed in Hx or ROS   Medications tried: Topical medications - doesn't recall name, didn't help Nsaids- cannot take for cardiac reasons Tylenol- doesn't help Opiates  Hydrocodone -she uses sparingly when pain is very severe, this does help control her pain Gabapentin / Lyrica  Has not tried  Tramadol-does not remember TCAs  - Denies  She is currently on Celexa for her anxiety, this medication visit has been working very well for her Robaxin helps pain   Other treatments: PT/OT  has not tried, in afib waiting on heart ablation  TENs unit - has not tried  Injections-denies Surgery - denies      Interal History 08/23/22   Kimberly Hobbs is here for follow-up regarding her chronic pain.  She reports she continues to have right-sided back pain.  She also has some aching pain in her  legs bilaterally.  She reports she has her cardiac ablation procedures 09/05/2022 so after this time she would be interested in getting facet joint injections.  Pain is worsened with standing and she continues to use her walker for ambulation.  Pain continues to be improved with lying down.  Vicodin 7.5 is helping her control this pain.  She tried to use this sparingly only when pain is very severe and 30 tablets has lasted her about a month and a half.  She is not having any side effects with this medication.   Interal History 10/29/2022 Kimberly Hobbs is here for follow-up regarding her right-sided back pain with aching pain in her legs.  She continues to use occasional Vicodin 7.5 which helps keep her pain controlled when it is very severe.  She tries to use this sparingly.  She has been started on gabapentin 100 mg at bedtime which has greatly reduced her bilateral leg pain.  She reports her Celexa was discontinued because it was no longer helping.  She says she is currently not on any antidepressant medicines for anxiety.  She has had a few sessions of physical therapy but has not had significant benefits at this time. Patient has been using occasional Flexeril 10 mg with benefit, she does not use this when she takes for Norco.     Interal History 01/07/23 Kimberly Hobbs  is here for follow-up regarding her right-sided back pain and leg pain.  She continues to use Vicodin 7.5 mg sparingly when the pain is severe.  She still has most of her pills from her last refill.  Leg pain continues to be improved since starting gabapentin.  She stopped using Cymbalta, says she did not tolerate this and made her feel bad.  She reports pain is not significantly improved after physical therapy.   Interal History 03/19/22 Kimberly Hobbs reports that she has been having worse pain in her lower back more on the right side.  When she does activity she will have pain all the way across her back.  Hydrocodone not helping pain is much as  it previously did.  Patient is currently following with cardiology working on treatment of her arrhythmia.  She is now restarted on amiodarone.  She had a dual-chamber ICD implanted 02/10/2023.  Pain Inventory Average Pain 5 Pain Right Now 2 My pain is intermittent, sharp, and burning  In the last 24 hours, has pain interfered with the following? General activity 0 Relation with others 0 Enjoyment of life 0 What TIME of day is your pain at its worst? evening and night Sleep (in general) Poor  Pain is worse with: walking, standing, and some activites Pain improves with: rest and medication Relief from Meds: 4  Family History  Problem Relation Age of Onset   Cancer Mother    Hypertension Mother    Diabetes Mother    Hyperlipidemia Mother    Heart disease Mother    Cancer Father    Hypertension Father    Diabetes Father    Heart disease Father    Hypertension Sister    Schizophrenia Maternal Grandmother    Hypertension Brother    Hypertension Brother    Cancer Daughter    COPD Daughter    Breast cancer Neg Hx    Social History   Socioeconomic History   Marital status: Divorced    Spouse name: Not on file   Number of children: 4   Years of education: Not on file   Highest education level: GED or equivalent  Occupational History   Not on file  Tobacco Use   Smoking status: Former    Current packs/day: 0.00    Average packs/day: 1.6 packs/day for 50.0 years (80.0 ttl pk-yrs)    Types: Cigarettes    Start date: 12/23/1976    Quit date: 12/23/2016    Years since quitting: 6.2    Passive exposure: Past   Smokeless tobacco: Never   Tobacco comments:    Former smoker 01/24/23  Vaping Use   Vaping status: Never Used  Substance and Sexual Activity   Alcohol use: Never   Drug use: Yes    Types: Hydrocodone   Sexual activity: Not Currently  Other Topics Concern   Not on file  Social History Narrative   ** Merged History Encounter **       Epworth Sleepiness Scale  = 4 (as of 06/28/2015)   Social Drivers of Health   Financial Resource Strain: Low Risk  (10/14/2022)   Overall Financial Resource Strain (CARDIA)    Difficulty of Paying Living Expenses: Not hard at all  Food Insecurity: No Food Insecurity (02/14/2023)   Hunger Vital Sign    Worried About Running Out of Food in the Last Year: Never true    Ran Out of Food in the Last Year: Never true  Transportation Needs: No Transportation Needs (02/14/2023)  PRAPARE - Administrator, Civil Service (Medical): No    Lack of Transportation (Non-Medical): No  Physical Activity: Inactive (10/14/2022)   Exercise Vital Sign    Days of Exercise per Week: 0 days    Minutes of Exercise per Session: 0 min  Stress: Stress Concern Present (10/14/2022)   [employer] of Occupational Health - Occupational Stress Questionnaire    Feeling of Stress : To some extent  Social Connections: Moderately Isolated (10/14/2022)   Social Connection and Isolation Panel [NHANES]    Frequency of Communication with Friends and Family: More than three times a week    Frequency of Social Gatherings with Friends and Family: Never    Attends Religious Services: Never    Database administrator or Organizations: No    Attends Banker Meetings: Never    Marital Status: Living with partner   Past Surgical History:  Procedure Laterality Date   ATRIAL FIBRILLATION ABLATION N/A 09/05/2022   Procedure: ATRIAL FIBRILLATION ABLATION;  Surgeon: Regan Lemming, MD;  Location: [facility];  Service: Cardiovascular;  Laterality: N/A;   CARDIAC CATHETERIZATION     CARDIAC VALVE REPLACEMENT     CARDIOVERSION N/A 04/02/2022   Procedure: CARDIOVERSION;  Surgeon: Elder Negus, MD;  Location: [facility];  Service: Cardiovascular;  Laterality: N/A;   CARDIOVERSION N/A 04/27/2022   Procedure: CARDIOVERSION;  Surgeon: Elder Negus, MD;  Location: [facility];  Service: Cardiovascular;   Laterality: N/A;   CHOLECYSTECTOMY     CORONARY ARTERY BYPASS GRAFT     CORONARY/GRAFT ANGIOGRAPHY N/A 08/18/2018   Procedure: CORONARY/GRAFT ANGIOGRAPHY;  Surgeon: Elder Negus, MD;  Location: [facility];  Service: Cardiovascular;  Laterality: N/A;   ELECTROPHYSIOLOGY STUDY N/A 02/04/2023   Procedure: ELECTROPHYSIOLOGY STUDY;  Surgeon: Regan Lemming, MD;  Location: [facility];  Service: Cardiovascular;  Laterality: N/A;   ICD IMPLANT N/A 02/10/2023   Procedure: ICD IMPLANT;  Surgeon: Regan Lemming, MD;  Location: [facility] INVASIVE CV LAB;  Service: Cardiovascular;  Laterality: N/A;   LEG SURGERY     "metal plate in leg"   RIGHT HEART CATH AND CORONARY/GRAFT ANGIOGRAPHY N/A 07/21/2018   Procedure: RIGHT HEART CATH AND CORONARY/GRAFT ANGIOGRAPHY;  Surgeon: Elder Negus, MD;  [facility]: [facility];  Service: Cardiovascular;  Laterality: N/A;   TEE WITHOUT CARDIOVERSION N/A 07/27/2015   Procedure: TRANSESOPHAGEAL ECHOCARDIOGRAM (TEE);  Surgeon: Thurmon Fair, MD;  Location: [facility] ENDOSCOPY;  Service: Cardiovascular;  Laterality: N/A;   TEE WITHOUT CARDIOVERSION N/A 07/21/2018   Procedure: TRANSESOPHAGEAL ECHOCARDIOGRAM (TEE);  Surgeon: Elder Negus, MD;  Location: [facility] ENDOSCOPY;  Service: Cardiovascular;  Laterality: N/A;   TONSILLECTOMY AND ADENOIDECTOMY     Past Surgical History:  Procedure Laterality Date   ATRIAL FIBRILLATION ABLATION N/A 09/05/2022   Procedure: ATRIAL FIBRILLATION ABLATION;  Surgeon: Regan Lemming, MD;  [facility]: [facility];  Service: Cardiovascular;  Laterality: N/A;   CARDIAC CATHETERIZATION     CARDIAC VALVE REPLACEMENT     CARDIOVERSION N/A 04/02/2022   Procedure: CARDIOVERSION;  Surgeon: Elder Negus, MD;  Location: [facility];  Service: Cardiovascular;  Laterality: N/A;   CARDIOVERSION N/A 04/27/2022   Procedure: CARDIOVERSION;  Surgeon: Elder Negus, MD;  Location: [facility];   Service: Cardiovascular;  Laterality: N/A;   CHOLECYSTECTOMY     CORONARY ARTERY BYPASS GRAFT     CORONARY/GRAFT ANGIOGRAPHY N/A 08/18/2018   Procedure: CORONARY/GRAFT ANGIOGRAPHY;  Surgeon: Rosemary Holms,  [name], MD;  Location: [facility];  Service: Cardiovascular;  Laterality: N/A;   ELECTROPHYSIOLOGY STUDY N/A 02/04/2023   Procedure: ELECTROPHYSIOLOGY STUDY;  Surgeon: Regan Lemming, MD;  [facility]: [facility];  Service: Cardiovascular;  Laterality: N/A;   ICD IMPLANT N/A 02/10/2023   Procedure: ICD IMPLANT;  Surgeon: Regan Lemming, MD;  Location: [facility] INVASIVE CV LAB;  Service: Cardiovascular;  Laterality: N/A;   LEG SURGERY     "metal plate in leg"   RIGHT HEART CATH AND CORONARY/GRAFT ANGIOGRAPHY N/A 07/21/2018   Procedure: RIGHT HEART CATH AND CORONARY/GRAFT ANGIOGRAPHY;  Surgeon: Elder Negus, MD;  [facility]: [facility];  Service: Cardiovascular;  Laterality: N/A;   TEE WITHOUT CARDIOVERSION N/A 07/27/2015   Procedure: TRANSESOPHAGEAL ECHOCARDIOGRAM (TEE);  Surgeon: Thurmon Fair, MD;  Location: [facility] ENDOSCOPY;  Service: Cardiovascular;  Laterality: N/A;   TEE WITHOUT CARDIOVERSION N/A 07/21/2018   Procedure: TRANSESOPHAGEAL ECHOCARDIOGRAM (TEE);  Surgeon: Elder Negus, MD;  Location: [facility] ENDOSCOPY;  Service: Cardiovascular;  Laterality: N/A;   TONSILLECTOMY AND ADENOIDECTOMY     Past Medical History:  Diagnosis Date   Allergy ?   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   COPD (chronic obstructive pulmonary disease) (HCC)    COVID-19    GERD (gastroesophageal reflux disease)    H/O mitral valve replacement with mechanical #25 mechanical SJM 01/01/2017) 01/01/2017   Hyperlipidemia    Hypertension    Pain in both lower extremities 07/23/2019   Paroxysmal atrial fibrillation (HCC) 06/29/2015   Rheumatic heart disease    MS/ AS   Sleep apnea    Status  post mechanical 19 mm mechanical regent AV replacement 01/01/2017 01/01/2017   BP 115/70   Pulse 89   Ht 5\' 5"  (1.651 m)   Wt 203 lb 3.2 oz (92.2 kg)   LMP 06/20/2015   SpO2 97%   BMI 33.81 kg/m   Opioid Risk Score:   Fall Risk Score:  `1  Depression screen [facility] 2/9     01/07/2023    9:27 AM 12/23/2022   10:25 AM 10/14/2022   10:49 AM 08/23/2022   10:32 AM 07/08/2022    9:52 AM 06/10/2022    9:24 AM 03/26/2022   11:02 AM  Depression screen PHQ 2/9  Decreased Interest 1 0 0 1 0 1 3  Down, Depressed, Hopeless 1 0 1 1 0 0 3  PHQ - 2 Score 2 0 1 2 0 1 6  Altered sleeping  3 0   3   Tired, decreased energy  1 1   2    Change in appetite  3 0   3   Feeling bad or failure about yourself   0 0   0   Trouble concentrating  0 0   0   Moving slowly or fidgety/restless  0 0   0   Suicidal thoughts  0 0   0   PHQ-9 Score  7 2   9    Difficult doing work/chores  Not difficult at all Somewhat difficult   Very difficult       Review of Systems  Musculoskeletal:  Positive for back pain.  All other systems reviewed and are negative.      Objective:   Physical Exam   Gen: no distress, normal appearing HEENT: oral mucosa pink and moist, NCAT Cardio: Reg rate  Chest: normal effort, normal rate of breathing Abd: soft, non-distended Ext: no edema Psych: pleasant, normal affect Skin: intact Neuro: Alert and awake, follows commands, cranial nerves II through XII intact, intact insight and judgment, speech and language intact Strength 5 out of 5 in all 4 extremities Sensation intact light touch in all 4 extremities Musculoskeletal:  Tenderness over paraspinal muscles lower lumbar spine (R>L)-uchanged TTP paraspinal muscles mostly around L4 level on the left Slump test negative bilaterally She has pain with spinal extension  Prior exam:  Mild tenderness noted over the right SI joint   MRI L-spine 1/17/2024IMPRESSION: 1. L4-L5 moderate facet arthropathy, which can be a cause of  back pain. Narrowing of the lateral recesses at this level could affect the descending L5 nerve roots. 2. No spinal canal stenosis or neural foraminal narrowing.        Assessment & Plan:    Chronic lower back pain bilateral but worse on the right without radiculopathy -No improvement with Tylenol, unable to take NSAIDs due to cardiac history -She has some tenderness over her right SI joint however do not think this is the main source of her pain at this time -Pain is primarily axial in her lower back -List of foods for pain provided at prior visit -ORT low -TENS unit, nexwave device ordered prior visit -Discontinue Norco 7.5 twice daily as needed, #60 , -Will change to Percocet 5 twice daily as needed, advised to call when she needs a refill -Continue current dose of gabapentin -Cymbalta 20 mg daily- She did not tolerate -She reports Physical Therapy provided minimal improvement -Pain agreement completed prior visit -Continue Flexeril 10 mg up to 3 times daily as needed, however advised to continue using sparingly -Discussed facet joint injections B/L L3-4, L4-5, R L5-S1-I think this would be a good option to try however it does not sound like would likely be able to come off her anticoagulation at this time -Continue UDS, Pill Counts- consistent, PDMP monitoring  -She is on gabapentin for restless leg.  She reports tightness in her legs at night that the gabapentin reduced.  Will increase dose gabapentin to 200 mg at bedtime     Chronic anxiety, denies SI or HI -No longer on Celexa, Cymbalta- she says "it made her feel bad" -Patient has follow-up with psychiatry later this month

## 2023-03-20 NOTE — H&P (View-Only) (Signed)
Cardiology Office Note:  .   Date:  03/20/2023  ID:  Kimberly Hobbs, DOB [birth year], MRN [id number] PCP: [name], PA  [facility] Providers Cardiologist:  Thomasene Ripple, DO Electrophysiologist:  [name], MD  Sleep Medicine:  Armanda Magic, MD {  History of Present Illness: .   Kimberly Hobbs is a 63 y.o. female w/PMHx of CAD/VHD (Rheumatic) (s/p CABG mechanical AVR/MVR 2018), CKD (III), HTN, HLD, DM, and AFib, COPD  Nov 2024, she was taken off amiodarone in anticipation of starting tikosyn. Sought attention in the ER 01/24/23, palpitations, SOB, lightheaded, found in a WCT failed to break with adenosine and no flutter wves noted> sedated and DCCV to SR Suspected to be VT EPS was NEGATIVE for inducible VT Had both rapid AFib and VT during her stay given amiodarone IV cMRI noted  LVEF 40%, RV insertion site LGE (non-specific scar finding in setting of elevated pulmonary pressures), dilated main pulmonary artery  Continued to have recurrent VT amiodarone gtt continued ICD implant delayed until rhythm settled ICD implanted 02/10/23 Discharged 02/11/23 INR was 1.4 > lovenox/warfarin for home LHC was discussed, not pursued, Trops were negative and discussion likely scar mediated  I saw her 02/26/23 She is hoping to be able to shower! She feels poorly in AFib, tired, weak, thinks she had a day a few days ago that she had a couple hours or so of normal rhythm No CP No near syncope or syncope No bleeding Uses an extra pill of lasix about 3x a month or so, not regularly She can tell when she is getting volume on > took an extra 20mg  of lasix last night > brisk increase in urine with it Implant site was stable, well healed PAFib 73.1% Amiodarone increased 200mg  BID Planned for weekly INRs if DCCV was needed Planned for early follow up  ER visit 03/18/23: cough, congestion, fever, concerns of viral illness Treated with DuoNeb COVID/flu neg, Rx prednisone  and doxy Felt to be COPD exacerbation Advised close follow of INRs IMPRESSION: Mild bilateral perihilar interstitial densities are noted concerning for possible pulmonary edema or atypical inflammation.  Today's visit is scheduled as a f/u on med changes  ROS:   She c/w very persistent/intermittently productive cough She self stopped the prednisone and doxy with terrible stomach upset/discomfort (reports they have had the same side effect in the past) She does have her inhalers/nebulizer at home She very much thinks this is a product of her allergies, perhaps a bronchitis  No CP She is aware of her rhythm, makes it had for her to sleep, hear beat is all over the place No near syncope or syncope Her INR check last night was 3.4 and she took her warfarin   Device information MDT dual chamber ICD implanted 02/10/23  Arrhythmia/AAD hx PVI ablation 09/05/22 VT hospitalization Dec 2024, amiodarone resumed NOT a VT ablation candidate gven mechanical AVR/(and MVR)  Studies Reviewed: Marland Kitchen    EKG done today and reviewed by myself AFlutter, some V paced beats, 95bpm, as well as PVCs  DEVICE interrogation done today and reviewed by myself Battery and lead measurements are good No VT AFib burden since 02/26/23 is 19.2 Current AFlutter episode in progress since 03/18/23   02/04/23: EPS CONCLUSIONS:  1.  Wide-complex tachycardia upon presentation.  2.  Atrial fibrillation cardioverted to sinus rhythm 3.  Negative EP study   02/03/23: TTE 1. Left ventricular ejection fraction, by estimation, is 55 to 60%. The  left  ventricle has normal function. The left ventricle has no regional  wall motion abnormalities. Left ventricular diastolic parameters are  indeterminate.   2. Right ventricular systolic function is mildly reduced. The right  ventricular size is normal.   3. Left atrial size was mildly dilated.   4. The mitral valve has been repaired/replaced. No evidence of mitral  valve  regurgitation. No evidence of mitral stenosis. The mean mitral valve  gradient is 3.0 mmHg. Echo findings are consistent with normal structure  and function of the mitral valve  prosthesis.   5. The aortic valve has been repaired/replaced. There is moderate  calcification of the aortic valve. Aortic valve regurgitation is not  visualized. Mild aortic valve stenosis. Aortic valve area, by VTI measures  1.34 cm. Aortic valve mean gradient  measures 19.5 mmHg. Aortic valve Vmax measures 3.00 m/s.   6. The inferior vena cava is normal in size with greater than 50%  respiratory variability, suggesting right atrial pressure of 3 mmHg.   Conclusion(s)/Recommendation(s): Gradient through mechanical aortic valve  mildly elevated but no change since echo 2/24.    09/05/22: EPS/ablation CONCLUSIONS: 1. Atrial fibrillation upon presentation.   2. Successful electrical isolation and anatomical encircling of all four pulmonary veins with radiofrequency current.  A WACA approach was used 3. Additional left atrial ablation was performed with a standard box lesion created along the posterior wall of the left atrium 4. Atrial fibrillation successfully cardioverted to sinus rhythm. 5. No early apparent complications.   Risk Assessment/Calculations:    Physical Exam:   VS:  LMP 06/20/2015    Wt Readings from Last 3 Encounters:  03/20/23 203 lb 3.2 oz (92.2 kg)  03/18/23 205 lb 9.6 oz (93.3 kg)  02/26/23 206 lb (93.4 kg)    GEN: Well nourished, well developed in no acute distress NECK: No JVD; No carotid bruits CARDIAC:  no murmurs, rubs, gallops RESPIRATORY:  she is moving air adequately, though has +exp, slight insp wheezing b/l and  rhonchi L>R ABDOMEN: Soft, non-tender, non-distended EXTREMITIES: No edema; No deformity   ICD site: well healed, no signs of infection   ASSESSMENT AND PLAN: .    ICD Well healed No signs of infection Slight keloid  VT Back on amiodarone None noted  since implant  Persistent AFib CHA21DS2Vasc is 5, on warfarin INRs have been theraoeutic weekly, and of late supra therapeutic  AFlutter today, fair rate control Dr. Lalla Brothers attempted to pace terminate the AFlutter (CL was 261) though unsuccessful down to burst pacing Advised CXR >> DCCV mid week next wek to allow improvement in respiratory status Will increase amiodarone to 400mg  BID See her next week post DCCV  CAD H/o CABG Rheumatic VHD H/o mechanical AVR/MVR Stable function by last echo Warfarin managed with her our coumadin clinic in [place]  6.  Mild CM by her MRI 40%, by echo 55-60% not volume OL by exam,  Still establishing her optivol f/u with Dr. Servando Salina  7.  Secondary hypercoagulable state  8. COPD exacerbation She both in/exp wheezes b/l rhonchi these are both worse on the L then the right Cxr from the 28th as noted above She denies feeling SOB, but the coughing persists She is not edematous Denies fever, symptoms of illness Will get CXR today I have reached out to her primary provider (via secure chat) to instill her assistance in managing this, unfortunately no available visits there, they will try to get her in with another provider in the system.  TIME of today's visit in review of her chart, EKG, device check, attempts with Dr. Lalla Brothers to pace terminate her AFlutter, and communication with her attending provider 55 minutes  Dispo: back in 2 weeks, sooner if needed   Signed, Sheilah Pigeon, PA-C

## 2023-03-20 NOTE — Progress Notes (Signed)
Cardiology Office Note:  .   Date:  03/20/2023  ID:  Kimberly Hobbs, DOB [birth year], MRN [id number] PCP: [name], PA  [facility] Providers Cardiologist:  Thomasene Ripple, DO Electrophysiologist:  [name], MD  Sleep Medicine:  Armanda Magic, MD {  History of Present Illness: .   Kimberly Hobbs is a 63 y.o. female w/PMHx of CAD/VHD (Rheumatic) (s/p CABG mechanical AVR/MVR 2018), CKD (III), HTN, HLD, DM, and AFib, COPD  Nov 2024, she was taken off amiodarone in anticipation of starting tikosyn. Sought attention in the ER 01/24/23, palpitations, SOB, lightheaded, found in a WCT failed to break with adenosine and no flutter wves noted> sedated and DCCV to SR Suspected to be VT EPS was NEGATIVE for inducible VT Had both rapid AFib and VT during her stay given amiodarone IV cMRI noted  LVEF 40%, RV insertion site LGE (non-specific scar finding in setting of elevated pulmonary pressures), dilated main pulmonary artery  Continued to have recurrent VT amiodarone gtt continued ICD implant delayed until rhythm settled ICD implanted 02/10/23 Discharged 02/11/23 INR was 1.4 > lovenox/warfarin for home LHC was discussed, not pursued, Trops were negative and discussion likely scar mediated  I saw her 02/26/23 She is hoping to be able to shower! She feels poorly in AFib, tired, weak, thinks she had a day a few days ago that she had a couple hours or so of normal rhythm No CP No near syncope or syncope No bleeding Uses an extra pill of lasix about 3x a month or so, not regularly She can tell when she is getting volume on > took an extra 20mg  of lasix last night > brisk increase in urine with it Implant site was stable, well healed PAFib 73.1% Amiodarone increased 200mg  BID Planned for weekly INRs if DCCV was needed Planned for early follow up  ER visit 03/18/23: cough, congestion, fever, concerns of viral illness Treated with DuoNeb COVID/flu neg, Rx prednisone  and doxy Felt to be COPD exacerbation Advised close follow of INRs IMPRESSION: Mild bilateral perihilar interstitial densities are noted concerning for possible pulmonary edema or atypical inflammation.  Today's visit is scheduled as a f/u on med changes  ROS:   She c/w very persistent/intermittently productive cough She self stopped the prednisone and doxy with terrible stomach upset/discomfort (reports they have had the same side effect in the past) She does have her inhalers/nebulizer at home She very much thinks this is a product of her allergies, perhaps a bronchitis  No CP She is aware of her rhythm, makes it had for her to sleep, hear beat is all over the place No near syncope or syncope Her INR check last night was 3.4 and she took her warfarin   Device information MDT dual chamber ICD implanted 02/10/23  Arrhythmia/AAD hx PVI ablation 09/05/22 VT hospitalization Dec 2024, amiodarone resumed NOT a VT ablation candidate gven mechanical AVR/(and MVR)  Studies Reviewed: Marland Kitchen    EKG done today and reviewed by myself AFlutter, some V paced beats, 95bpm, as well as PVCs  DEVICE interrogation done today and reviewed by myself Battery and lead measurements are good No VT AFib burden since 02/26/23 is 19.2 Current AFlutter episode in progress since 03/18/23   02/04/23: EPS CONCLUSIONS:  1.  Wide-complex tachycardia upon presentation.  2.  Atrial fibrillation cardioverted to sinus rhythm 3.  Negative EP study   02/03/23: TTE 1. Left ventricular ejection fraction, by estimation, is 55 to 60%. The  left  ventricle has normal function. The left ventricle has no regional  wall motion abnormalities. Left ventricular diastolic parameters are  indeterminate.   2. Right ventricular systolic function is mildly reduced. The right  ventricular size is normal.   3. Left atrial size was mildly dilated.   4. The mitral valve has been repaired/replaced. No evidence of mitral  valve  regurgitation. No evidence of mitral stenosis. The mean mitral valve  gradient is 3.0 mmHg. Echo findings are consistent with normal structure  and function of the mitral valve  prosthesis.   5. The aortic valve has been repaired/replaced. There is moderate  calcification of the aortic valve. Aortic valve regurgitation is not  visualized. Mild aortic valve stenosis. Aortic valve area, by VTI measures  1.34 cm. Aortic valve mean gradient  measures 19.5 mmHg. Aortic valve Vmax measures 3.00 m/s.   6. The inferior vena cava is normal in size with greater than 50%  respiratory variability, suggesting right atrial pressure of 3 mmHg.   Conclusion(s)/Recommendation(s): Gradient through mechanical aortic valve  mildly elevated but no change since echo 2/24.    09/05/22: EPS/ablation CONCLUSIONS: 1. Atrial fibrillation upon presentation.   2. Successful electrical isolation and anatomical encircling of all four pulmonary veins with radiofrequency current.  A WACA approach was used 3. Additional left atrial ablation was performed with a standard box lesion created along the posterior wall of the left atrium 4. Atrial fibrillation successfully cardioverted to sinus rhythm. 5. No early apparent complications.   Risk Assessment/Calculations:    Physical Exam:   VS:  LMP 06/20/2015    Wt Readings from Last 3 Encounters:  03/20/23 203 lb 3.2 oz (92.2 kg)  03/18/23 205 lb 9.6 oz (93.3 kg)  02/26/23 206 lb (93.4 kg)    GEN: Well nourished, well developed in no acute distress NECK: No JVD; No carotid bruits CARDIAC:  no murmurs, rubs, gallops RESPIRATORY:  she is moving air adequately, though has +exp, slight insp wheezing b/l and  rhonchi L>R ABDOMEN: Soft, non-tender, non-distended EXTREMITIES: No edema; No deformity   ICD site: well healed, no signs of infection   ASSESSMENT AND PLAN: .    ICD Well healed No signs of infection Slight keloid  VT Back on amiodarone None noted  since implant  Persistent AFib CHA21DS2Vasc is 5, on warfarin INRs have been theraoeutic weekly, and of late supra therapeutic  AFlutter today, fair rate control Dr. Lalla Brothers attempted to pace terminate the AFlutter (CL was 261) though unsuccessful down to burst pacing Advised CXR >> DCCV mid week next wek to allow improvement in respiratory status Will increase amiodarone to 400mg  BID See her next week post DCCV  CAD H/o CABG Rheumatic VHD H/o mechanical AVR/MVR Stable function by last echo Warfarin managed with her our coumadin clinic in [place]  6.  Mild CM by her MRI 40%, by echo 55-60% not volume OL by exam,  Still establishing her optivol f/u with Dr. Servando Salina  7.  Secondary hypercoagulable state  8. COPD exacerbation She both in/exp wheezes b/l rhonchi these are both worse on the L then the right Cxr from the 28th as noted above She denies feeling SOB, but the coughing persists She is not edematous Denies fever, symptoms of illness Will get CXR today I have reached out to her primary provider (via secure chat) to instill her assistance in managing this, unfortunately no available visits there, they will try to get her in with another provider in the system.  TIME of today's visit in review of her chart, EKG, device check, attempts with Dr. Lalla Brothers to pace terminate her AFlutter, and communication with her attending provider 55 minutes  Dispo: back in 2 weeks, sooner if needed   Signed, Sheilah Pigeon, PA-C

## 2023-03-21 ENCOUNTER — Encounter: Payer: Self-pay | Admitting: *Deleted

## 2023-03-21 ENCOUNTER — Ambulatory Visit (HOSPITAL_BASED_OUTPATIENT_CLINIC_OR_DEPARTMENT_OTHER)
Admission: RE | Admit: 2023-03-21 | Discharge: 2023-03-21 | Disposition: A | Discharge status: Home / Self Care | Payer: 59 | Source: Ambulatory Visit | Attending: Family Medicine | Admitting: Family Medicine

## 2023-03-21 ENCOUNTER — Encounter: Payer: Self-pay | Admitting: Physician Assistant

## 2023-03-21 ENCOUNTER — Ambulatory Visit: Payer: 59 | Attending: Physician Assistant | Admitting: Physician Assistant

## 2023-03-21 ENCOUNTER — Ambulatory Visit
Admission: RE | Admit: 2023-03-21 | Discharge: 2023-03-21 | Disposition: A | Discharge status: Home / Self Care | Payer: 59 | Source: Ambulatory Visit | Attending: Physician Assistant

## 2023-03-21 ENCOUNTER — Encounter (HOSPITAL_BASED_OUTPATIENT_CLINIC_OR_DEPARTMENT_OTHER): Payer: Self-pay

## 2023-03-21 VITALS — BP 119/80 | HR 84 | Temp 97.4°F | Resp 20

## 2023-03-21 VITALS — BP 128/72 | HR 95 | Ht 65.0 in | Wt 201.6 lb

## 2023-03-21 DIAGNOSIS — I4892 Unspecified atrial flutter: Secondary | ICD-10-CM | POA: Diagnosis not present

## 2023-03-21 DIAGNOSIS — R0602 Shortness of breath: Secondary | ICD-10-CM | POA: Diagnosis not present

## 2023-03-21 DIAGNOSIS — D6869 Other thrombophilia: Secondary | ICD-10-CM

## 2023-03-21 DIAGNOSIS — I2510 Atherosclerotic heart disease of native coronary artery without angina pectoris: Secondary | ICD-10-CM | POA: Diagnosis not present

## 2023-03-21 DIAGNOSIS — I480 Paroxysmal atrial fibrillation: Secondary | ICD-10-CM

## 2023-03-21 DIAGNOSIS — J189 Pneumonia, unspecified organism: Secondary | ICD-10-CM

## 2023-03-21 DIAGNOSIS — J441 Chronic obstructive pulmonary disease with (acute) exacerbation: Secondary | ICD-10-CM | POA: Diagnosis not present

## 2023-03-21 DIAGNOSIS — R059 Cough, unspecified: Secondary | ICD-10-CM | POA: Diagnosis not present

## 2023-03-21 DIAGNOSIS — R051 Acute cough: Secondary | ICD-10-CM

## 2023-03-21 LAB — CUP PACEART INCLINIC DEVICE CHECK
Date Time Interrogation Session: 20250131130004
Implantable Lead Connection Status: 753985
Implantable Lead Connection Status: 753985
Implantable Lead Implant Date: 20241223
Implantable Lead Implant Date: 20241223
Implantable Lead Location: 753859
Implantable Lead Location: 753860
Implantable Lead Model: 5076
Implantable Lead Model: 5076
Implantable Pulse Generator Implant Date: 20241223

## 2023-03-21 MED ORDER — AMIODARONE HCL 400 MG PO TABS: 400.0000 mg | ORAL_TABLET | Freq: Two times a day (BID) | ORAL | 1 refills | Status: DC

## 2023-03-21 MED ORDER — DEXAMETHASONE SODIUM PHOSPHATE 10 MG/ML IJ SOLN
10.0000 mg | Freq: Once | INTRAMUSCULAR | Status: AC
  Administered 2023-03-21: 10 mg via INTRAMUSCULAR

## 2023-03-21 MED ORDER — CEFDINIR 300 MG PO CAPS: 600.0000 mg | ORAL_CAPSULE | Freq: Every day | ORAL | 0 refills | Status: AC

## 2023-03-21 MED ORDER — GUAIFENESIN-CODEINE 100-10 MG/5ML PO SOLN: 5.0000 mL | Freq: Four times a day (QID) | ORAL | 0 refills | Status: DC | PRN

## 2023-03-21 NOTE — Patient Instructions (Addendum)
Medication Instructions:    START TAKING :  AMIODARONE 400 MG TWICE A  DAY   *If you need a refill on your cardiac medications before your next appointment, please call your pharmacy*   Lab Work:    PLEASE GO DOWN STAIRS FIRST FLOOR   [facility] [address] :  CBC TODAY     If you have labs (blood work) drawn today and your tests are completely normal, you will receive your results only by: MyChart Message (if you have MyChart) OR A paper copy in the mail If you have any lab test that is abnormal or we need to change your treatment, we will call you to review the results.   Testing/Procedures: A chest x-ray takes a picture of the organs and structures inside the chest, including the heart, lungs, and blood vessels. This test can show several things, including, whether the heart is enlarges; whether fluid is building up in the lungs; and whether pacemaker / defibrillator leads are still in place.  Address: [address] [place], [place], [place] [address] Phone: [phone] Hours:  Monday 6:30?AM-7?PM Tuesday 6:30?AM-7?PM Wednesday 6:30?AM-7?PM Thursday 6:30?AM-7?PM Friday 6:30?AM-7?PM Saturday 6:30?AM-7?PM Sunday 6:30?AM-7?PM     Electrical Cardioversion uses a jolt of electricity to your heart either through paddles or wired patches attached to your chest. This is a controlled, usually prescheduled, procedure. Defibrillation is done under light anesthesia in the hospital, and you usually go home the day of the procedure. This is done to get your heart back into a normal rhythm. You are not awake for the procedure. Please see the instruction sheet given to you today.    Follow-Up: At [facility], you and your health needs are our priority.  As part of our continuing mission to provide you with exceptional heart care, we have created designated Provider Care Teams.  These Care Teams include your primary Cardiologist (physician) and Advanced Practice Providers (APPs -  Physician  Assistants and Nurse Practitioners) who all work together to provide you with the care you need, when you need it.  We recommend signing up for the patient portal called "MyChart".  Sign up information is provided on this After Visit Summary.  MyChart is used to connect with patients for Virtual Visits (Telemedicine).  Patients are able to view lab/test results, encounter notes, upcoming appointments, etc.  Non-urgent messages can be sent to your provider as well.   To learn more about what you can do with MyChart, go to [email or web address].    Your next appointment:  AFTER CARDIOVERSION  1-2  week(s) ( CONTACT  CASSIE HALL/ ANGELINE HAMMER FOR EP SCHEDULING ISSUES )   Provider:    You may see [name], MD-+ or one of the following Advanced Practice Providers on your designated Care Team:   Francis Dowse, [place] [address]" [place], [place] Canary Brim, NP  Other Instructions

## 2023-03-21 NOTE — Discharge Instructions (Addendum)
You have been given a shot of dexamethasone 10 mg, steroid.  This medication can make your sugars go higher.  Take cefdinir 300 mg--2 capsules together daily for 7 days  Robitussin with codeine cough syrup--take 5 mL or 1 teaspoon every 6 hours as needed for cough.

## 2023-03-21 NOTE — ED Provider Notes (Signed)
[facility]    CSN: [id number] Arrival date & time: 03/21/23  1310      History   Chief Complaint Chief Complaint  Patient presents with   Follow-up    cough, wheezing, chest tightness(Afib) - Entered by patient    HPI Kimberly Hobbs is a 63 y.o. female.   HPI Here for continued cough and wheezing and chest tightness. She was seen here on January 28.  She was treated for a COPD exacerbation with prednisone, and doxycycline was sent in as she had an abnormal chest x-ray with bilateral infiltrate.  She was seen by her cardiology this morning, and she revealed to them that she had had to stop her prednisone and doxycycline due to some stomach cramps.  Those are improved now that she is not taking the Doxy or prednisone.  She does have medication for the nebulizer at home.  The [employer] are not helping for cough either.    Past Medical History:  Diagnosis Date   Allergy ?   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 01/01/2017 01/01/2017    Patient Active Problem List   Diagnosis Date Noted   Ventricular tachycardia (HCC) 02/03/2023   VT (ventricular tachycardia) (HCC) 02/03/2023   Psychophysiological insomnia 01/23/2023   Hypercoagulable state due to persistent atrial fibrillation (HCC) 10/03/2022   Restless leg 10/01/2022   OSA (obstructive sleep apnea) 09/27/2022   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/2022   Iron deficiency anemia  07/31/2022   DDD (degenerative disc disease), lumbosacral 03/26/2022   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   Chronic right-sided low back pain without sciatica 02/26/2022   Cervical strain 12/20/2021   Sensorineural hearing loss (SNHL) of both ears 10/24/2021   Hemorrhoids 05/18/2021   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   Paroxysmal atrial fibrillation (HCC) 03/28/2021   GAD (generalized anxiety disorder) 02/01/2021   DM (diabetes mellitus) (HCC) 02/01/2021   COPD mixed type (HCC) 07/07/2019   Pulmonary hypertension, unspecified (HCC) 07/21/2018   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/05/2018   Long term (current) use of anticoagulants 01/08/2017   H/O mitral valve replacement with mechanical #25 mechanical SJM 01/01/2017) 01/01/2017   PVCs (premature ventricular contractions) 11/17/2015   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    Essential hypertension 06/29/2015    Past Surgical History:  Procedure Laterality Date   ATRIAL FIBRILLATION ABLATION N/A 09/05/2022   Procedure: ATRIAL FIBRILLATION ABLATION;  Surgeon: Regan Lemming, MD;  Location: [facility];  Service: Cardiovascular;  Laterality: N/A;   CARDIAC CATHETERIZATION     CARDIAC VALVE REPLACEMENT     CARDIOVERSION N/A 04/02/2022   Procedure: CARDIOVERSION;  Surgeon: Elder Negus, MD;  Location: [facility]OS[facility];  Service: Cardiovascular;  Laterality: N/A;   CARDIOVERSION N/A 04/27/2022   Procedure: CARDIOVERSION;  Surgeon:  [name], [name], MD;  Location: [facility]OS[facility];  Service: Cardiovascular;  Laterality: N/A;   CHOLECYSTECTOMY     CORONARY ARTERY BYPASS GRAFT     CORONARY/GRAFT ANGIOGRAPHY N/A 08/18/2018   Procedure: CORONARY/GRAFT ANGIOGRAPHY;  Surgeon: Elder Negus, MD;  Location: [facility];  Service: Cardiovascular;  Laterality: N/A;   ELECTROPHYSIOLOGY STUDY N/A 02/04/2023    Procedure: ELECTROPHYSIOLOGY STUDY;  Surgeon: Regan Lemming, MD;  Location: [facility];  Service: Cardiovascular;  Laterality: N/A;   ICD IMPLANT N/A 02/10/2023   Procedure: ICD IMPLANT;  Surgeon: Regan Lemming, MD;  Location: [facility] INVASIVE CV LAB;  Service: Cardiovascular;  Laterality: N/A;   LEG SURGERY     "metal plate in leg"   RIGHT HEART CATH AND CORONARY/GRAFT ANGIOGRAPHY N/A 07/21/2018   Procedure: RIGHT HEART CATH AND CORONARY/GRAFT ANGIOGRAPHY;  Surgeon: Elder Negus, MD;  Location: [facility];  Service: Cardiovascular;  Laterality: N/A;   TEE WITHOUT CARDIOVERSION N/A 07/27/2015   Procedure: TRANSESOPHAGEAL ECHOCARDIOGRAM (TEE);  Surgeon: Thurmon Fair, MD;  Location: [facility] ENDOSCOPY;  Service: Cardiovascular;  Laterality: N/A;   TEE WITHOUT CARDIOVERSION N/A 07/21/2018   Procedure: TRANSESOPHAGEAL ECHOCARDIOGRAM (TEE);  Surgeon: Elder Negus, MD;  Location: [facility] ENDOSCOPY;  Service: Cardiovascular;  Laterality: N/A;   TONSILLECTOMY AND ADENOIDECTOMY      OB History   No obstetric history on file.      Home Medications    Prior to Admission medications   Medication Sig Start Date End Date Taking? Authorizing Provider  cefdinir (OMNICEF) 300 MG capsule Take 2 capsules (600 mg total) by mouth daily for 7 days. 03/21/23 03/28/23 Yes Kaydince Towles, Janace Aris, MD  guaiFENesin-codeine 100-10 MG/5ML syrup Take 5 mLs by mouth every 6 (six) hours as needed for cough. 03/21/23  Yes [name], MD  Accu-Chek Softclix Lancets lancets Use as instructed Patient taking differently: 1 each by Other route See admin instructions. Use as instructed 05/23/22   Nche, [name], NP  acetaminophen (TYLENOL) 325 MG tablet Take 2 tablets (650 mg total) by mouth every 4 (four) hours as needed for headache or mild pain (pain score 1-3). 02/11/23   [name], PA-C  albuterol (VENTOLIN HFA) 108 (90 Base) MCG/ACT inhaler Inhale 1-2 puffs into the lungs  every 6 (six) hours as needed for wheezing or shortness of breath.    [provider]  amiodarone (PACERONE) 400 MG tablet Take 1 tablet (400 mg total) by mouth 2 (two) times daily. 03/21/23   Sheilah Pigeon, PA-C  aspirin EC 81 MG tablet Take 81 mg by mouth daily.     [provider]  benzonatate (TESSALON) 200 MG capsule Take 1 capsule (200 mg total) by mouth at bedtime. 03/17/23   Saralyn Pilar A, PA  Blood Glucose Monitoring Suppl (ACCU-CHEK GUIDE) w/Device KIT Check glucose every morning Patient taking differently: 1 each by Other route See admin instructions. Check glucose every morning 05/21/22   Nche, [name], NP  busPIRone (BUSPAR) 10 MG tablet Take 1 tablet (10 mg total) by mouth 2 (two) times daily. 02/24/23   [name], PA  clonazePAM (KLONOPIN) 0.5 MG tablet Take 1.5 tablets (0.75 mg total) by mouth at bedtime. 01/23/23   [name], PA  furosemide (LASIX) 20 MG tablet Take 20 mg by mouth 2 (two) times daily.    [provider]  gabapentin (NEURONTIN) 100 MG capsule Take 2 capsules (200 mg total) by mouth at bedtime.  03/20/23   [name], MD  glucose blood (ACCU-CHEK GUIDE) test strip Use as instructed Patient taking differently: 1 each by Other route as needed for other (Glucose chesk). Use as instructed 05/21/22   Nche, [name], NP  guaiFENesin 200 MG tablet Take 1 tablet (200 mg total) by mouth every 4 (four) hours as needed. 03/17/23   [name], PA  isosorbide mononitrate (IMDUR) 60 MG 24 hr tablet Take 1 tablet (60 mg total) by mouth daily. 09/13/22   [name], [name], MD  magnesium oxide (MAG-OX) 400 (240 Mg) MG tablet Take 1 tablet (400 mg total) by mouth daily. 02/12/23   [name], PA-C  metFORMIN (GLUCOPHAGE) 500 MG tablet Take 1 tablet (500 mg total) by mouth 2 (two) times daily with a meal. 12/23/22   Saralyn Pilar A, PA  metoprolol succinate (TOPROL-XL) 100 MG 24 hr tablet Take 1 tablet (100  mg total) by mouth 2 (two) times daily. Take with or immediately following a meal. 02/11/23   Tillery, Mariam Dollar, PA-C  montelukast (SINGULAIR) 10 MG tablet Take 1 tablet (10 mg total) by mouth daily. 05/27/22 05/27/23  Charlott Holler, MD  oxyCODONE-acetaminophen (PERCOCET) 5-325 MG tablet Take 1 tablet by mouth every 12 (twelve) hours as needed for severe pain (pain score 7-10). 03/20/23   [name], MD  rosuvastatin (CRESTOR) 40 MG tablet TAKE 1 TABLET(40 MG) BY MOUTH DAILY 03/04/23   [name], Manish J, MD  sacubitril-valsartan (ENTRESTO) 24-26 MG Take 1 tablet by mouth 2 (two) times daily. 02/11/23   [name], PA-C  Tiotropium Bromide-Olodaterol (STIOLTO RESPIMAT) 2.5-2.5 MCG/ACT AERS Inhale 2 puffs into the lungs daily. 09/03/22   Charlott Holler, MD  warfarin (COUMADIN) 5 MG tablet As directed after hospital. Take 5mg  (one tablet) once daily on Tuesdays, Thursdays, Saturdays, and Sundays. To be taken in addition to warfarin 7.5mg  once daily on Mondays, Wednesdays, and Fridays. 02/11/23   [name], PA-C    Family History Family History  Problem Relation Age of Onset   Cancer Mother    Hypertension Mother    Diabetes Mother    Hyperlipidemia Mother    Heart disease Mother    Cancer Father    Hypertension Father    Diabetes Father    Heart disease Father    Hypertension Sister    Schizophrenia Maternal Grandmother    Hypertension Brother    Hypertension Brother    Cancer Daughter    COPD Daughter    Breast cancer Neg Hx     Social History Social History   Tobacco Use   Smoking status: Former    Current packs/day: 0.00    Average packs/day: 1.6 packs/day for 50.0 years (80.0 ttl pk-yrs)    Types: Cigarettes    Start date: 12/23/1976    Quit date: 12/23/2016    Years since quitting: 6.2    Passive exposure: Past   Smokeless tobacco: Never   Tobacco comments:    Former smoker 01/24/23  Vaping Use   Vaping status: Never Used   Substance Use Topics   Alcohol use: Never   Drug use: Yes    Types: Hydrocodone     Allergies   Duloxetine hcl, Iodinated contrast media, Penicillins, and Doxycycline   Review of Systems Review of Systems   Physical Exam Triage Vital Signs ED Triage Vitals  Encounter Vitals Group     BP 03/21/23 1407 119/80     Systolic BP Percentile --  Diastolic BP Percentile --      Pulse Rate 03/21/23 1407 84     Resp 03/21/23 1407 20     Temp 03/21/23 1407 (!) 97.4 F (36.3 C)     Temp Source 03/21/23 1407 Oral     SpO2 03/21/23 1407 96 %     Weight --      Height --      Head Circumference --      Peak Flow --      Pain Score 03/21/23 1409 0     Pain Loc --      Pain Education --      Exclude from Growth Chart --    No data found.  Updated Vital Signs BP 119/80 (BP Location: Right Arm)   Pulse 84   Temp (!) 97.4 F (36.3 C) (Oral)   Resp 20   LMP 06/20/2015   SpO2 96%   Visual Acuity Right Eye Distance:   Left Eye Distance:   Bilateral Distance:    Right Eye Near:   Left Eye Near:    Bilateral Near:     Physical Exam Vitals reviewed.  Constitutional:      General: She is not in acute distress.    Appearance: She is not ill-appearing, toxic-appearing or diaphoretic.  HENT:     Mouth/Throat:     Mouth: Mucous membranes are moist.     Pharynx: No oropharyngeal exudate or posterior oropharyngeal erythema.  Eyes:     Extraocular Movements: Extraocular movements intact.     Conjunctiva/sclera: Conjunctivae normal.     Pupils: Pupils are equal, round, and reactive to light.  Cardiovascular:     Rate and Rhythm: Rhythm irregular.  Pulmonary:     Effort: No respiratory distress.     Breath sounds: No stridor. No rhonchi or rales.     Comments: She has in the expiratory wheezes throughout her lung fields. Chest:     Chest wall: No tenderness.  Musculoskeletal:     Cervical back: Neck supple.  Lymphadenopathy:     Cervical: No cervical adenopathy.   Skin:    Capillary Refill: Capillary refill takes less than 2 seconds.     Coloration: Skin is not jaundiced or pale.  Neurological:     General: No focal deficit present.     Mental Status: She is alert and oriented to person, place, and time.  Psychiatric:        Behavior: Behavior normal.      UC Treatments / Results  Labs (all labs ordered are listed, but only abnormal results are displayed) Labs Reviewed - No data to display  EKG   Radiology    Procedures Procedures (including critical care time)  Medications Ordered in UC Medications  dexamethasone (DECADRON) injection 10 mg (has no administration in time range)    Initial Impression / Assessment and Plan / UC Course  I have reviewed the triage vital signs and the nursing notes.  Pertinent labs & imaging results that were available during my care of the patient were reviewed by me and considered in my medical decision making (see chart for details).   I discussed with her that there are no antibiotics other than doxycycline they do not interact in a severe way with her warfarin or amiodarone, that would be used to treat atypical infection (the chest x-ray 2 days ago was read as potentially an atypical infection).  Truman Hayward is sent in to treat the pneumonia, and Robitussin with codeine is  sent in to treat the cough since the [facility] are not helping.  Injection of Decadron is given here for the COPD exacerbation Final Clinical Impressions(s) / UC Diagnoses   Final diagnoses:  COPD exacerbation (HCC)  Community acquired pneumonia, unspecified laterality     Discharge Instructions      You have been given a shot of dexamethasone 10 mg, steroid.  This medication can make your sugars go higher.  Take cefdinir 300 mg--2 capsules together daily for 7 days  Robitussin with codeine cough syrup--take 5 mL or 1 teaspoon every 6 hours as needed for cough.        ED Prescriptions     Medication Sig  Dispense Auth. Provider   guaiFENesin-codeine 100-10 MG/5ML syrup Take 5 mLs by mouth every 6 (six) hours as needed for cough. 120 mL [name], MD   cefdinir (OMNICEF) 300 MG capsule Take 2 capsules (600 mg total) by mouth daily for 7 days. 14 capsule Esequiel Kleinfelter, Janace Aris, MD      I have reviewed the PDMP during this encounter.   [name], MD 03/21/23 [phone]

## 2023-03-21 NOTE — ED Triage Notes (Signed)
Saw cardiologist today. Patient is in Afib. Cardiologist believes prednisone contributed to this. Patient discontinued prednisone and doxycycline. Needing alternative antibiotic.

## 2023-03-22 LAB — CBC
Hematocrit: 39 % (ref 34.0–46.6)
Hemoglobin: 11.8 g/dL (ref 11.1–15.9)
MCH: 26.5 pg — ABNORMAL LOW (ref 26.6–33.0)
MCHC: 30.3 g/dL — ABNORMAL LOW (ref 31.5–35.7)
MCV: 87 fL (ref 79–97)
Platelets: 282 10*3/uL (ref 150–450)
RBC: 4.46 x10E6/uL (ref 3.77–5.28)
RDW: 15.1 % (ref 11.7–15.4)
WBC: 8.8 10*3/uL (ref 3.4–10.8)

## 2023-03-25 ENCOUNTER — Telehealth: Payer: Self-pay

## 2023-03-25 ENCOUNTER — Ambulatory Visit: Payer: 59 | Admitting: Nurse Practitioner

## 2023-03-25 ENCOUNTER — Ambulatory Visit: Payer: 59 | Admitting: Cardiology

## 2023-03-25 ENCOUNTER — Ambulatory Visit: Payer: 59 | Attending: Cardiology

## 2023-03-25 ENCOUNTER — Ambulatory Visit: Payer: 59

## 2023-03-25 DIAGNOSIS — Z5181 Encounter for therapeutic drug level monitoring: Secondary | ICD-10-CM

## 2023-03-25 DIAGNOSIS — I480 Paroxysmal atrial fibrillation: Secondary | ICD-10-CM

## 2023-03-25 LAB — POCT INR: INR: 5.1 — AB (ref 2.0–3.0)

## 2023-03-25 LAB — TOXASSURE SELECT,+ANTIDEPR,UR

## 2023-03-25 NOTE — Patient Instructions (Signed)
Description   Eat a serving of greens and HOLD today's dose and then START taking 5mg  daily except 7.5mg  on Wednesday, and Friday.  Recheck in 1 week  Anticoagulation Clinic [phone]  1/31/ Amio increased 400mg  daily

## 2023-03-25 NOTE — Telephone Encounter (Signed)
Called and spoke with pt. Scheduled coumadin clinic appt for Thursday, 03/27/23 at [facility] office. Pt verbalized understanding.

## 2023-03-25 NOTE — Telephone Encounter (Signed)
-----   Message from [name] sent at 03/25/2023  9:36 AM EST ----- Regarding: RE: DCCV tomorrow, 03/26/23 Yes she is fine to get her cardioversion tomorrow - can you repeat her INR on Thursday to make sure it is ok ----- Message ----- From: Joesph Lavanda NOVAK, RN Sent: 03/25/2023   9:21 AM EST To: Wilbert JONELLE Bihari, MD; Geni LITTIE Sar, RN Subject: DCCV tomorrow, 03/26/23                          Good Morning Dr Bihari,   I just saw this pt in the [facility] and her INR is 5.1. I see where she is scheduled for DCCV tomorrow with you.   She was on Prednisone  last week, currently on antibiotics, and her Amiodarone  was increased to 400mg  when she saw Renee on 1/31  - multiple reasons why it may be supra therapeutic again today. I did decrease her Warfarin dose last week; however she did not take dose as instructed.  Pt is aware she should hold's today warfarin dose. I wanted to make you aware and determine if pt can still have procedure tomorrow.   Thank you,  Lavanda, RN

## 2023-03-25 NOTE — OR Nursing (Signed)
 Called patient with pre-procedure instructions for tomorrow.   Patient informed of:   Time to arrive for procedure. 1130 Remain NPO past midnight.  Must have a ride home and a responsible adult to remain with them for 24 hours post procedure.  Confirmed blood thinner. Confirmed no breaks in taking blood thinner for 3+ weeks prior to procedure. Confirmed patient stopped all GLP-1s and GLP-2s for at least one week before procedure.

## 2023-03-26 ENCOUNTER — Other Ambulatory Visit: Payer: Self-pay

## 2023-03-26 ENCOUNTER — Ambulatory Visit (INDEPENDENT_AMBULATORY_CARE_PROVIDER_SITE_OTHER): Payer: 59

## 2023-03-26 ENCOUNTER — Ambulatory Visit (HOSPITAL_COMMUNITY)
Admission: RE | Admit: 2023-03-26 | Discharge: 2023-03-26 | Disposition: A | Discharge status: Home / Self Care | Payer: 59 | Source: Ambulatory Visit | Attending: Cardiology | Admitting: Cardiology

## 2023-03-26 ENCOUNTER — Encounter (HOSPITAL_COMMUNITY)
Admission: RE | Disposition: A | Discharge status: Home / Self Care | Payer: Self-pay | Source: Ambulatory Visit | Attending: Cardiology

## 2023-03-26 ENCOUNTER — Encounter (HOSPITAL_COMMUNITY): Payer: Self-pay

## 2023-03-26 ENCOUNTER — Encounter: Payer: Self-pay | Admitting: Family Medicine

## 2023-03-26 DIAGNOSIS — Z538 Procedure and treatment not carried out for other reasons: Secondary | ICD-10-CM | POA: Diagnosis not present

## 2023-03-26 DIAGNOSIS — D6869 Other thrombophilia: Secondary | ICD-10-CM | POA: Diagnosis not present

## 2023-03-26 DIAGNOSIS — I480 Paroxysmal atrial fibrillation: Secondary | ICD-10-CM | POA: Diagnosis not present

## 2023-03-26 DIAGNOSIS — G2581 Restless legs syndrome: Secondary | ICD-10-CM

## 2023-03-26 DIAGNOSIS — E1169 Type 2 diabetes mellitus with other specified complication: Secondary | ICD-10-CM

## 2023-03-26 DIAGNOSIS — Z5181 Encounter for therapeutic drug level monitoring: Secondary | ICD-10-CM

## 2023-03-26 LAB — POCT I-STAT, CHEM 8
BUN: 26 mg/dL — ABNORMAL HIGH (ref 8–23)
Calcium, Ion: 1.2 mmol/L (ref 1.15–1.40)
Chloride: 103 mmol/L (ref 98–111)
Creatinine, Ser: 1.5 mg/dL — ABNORMAL HIGH (ref 0.44–1.00)
Glucose, Bld: 107 mg/dL — ABNORMAL HIGH (ref 70–99)
HCT: 34 % — ABNORMAL LOW (ref 36.0–46.0)
Hemoglobin: 11.6 g/dL — ABNORMAL LOW (ref 12.0–15.0)
Potassium: 3.1 mmol/L — ABNORMAL LOW (ref 3.5–5.1)
Sodium: 141 mmol/L (ref 135–145)
TCO2: 24 mmol/L (ref 22–32)

## 2023-03-26 LAB — PROTIME-INR
INR: 3.8 — ABNORMAL HIGH (ref 0.8–1.2)
Prothrombin Time: 37.8 s — ABNORMAL HIGH (ref 11.4–15.2)

## 2023-03-26 SURGERY — CARDIOVERSION (CATH LAB)
Anesthesia: General

## 2023-03-26 MED ORDER — SODIUM CHLORIDE 0.9% FLUSH: 3.0000 mL | INTRAVENOUS | Status: DC | PRN

## 2023-03-26 MED ORDER — GABAPENTIN 100 MG PO CAPS: 100.0000 mg | ORAL_CAPSULE | Freq: Every day | ORAL | Status: DC

## 2023-03-26 MED ORDER — BLOOD GLUCOSE TEST VI STRP: 1.0000 | ORAL_STRIP | Freq: Three times a day (TID) | 11 refills | Status: AC

## 2023-03-26 MED ORDER — POTASSIUM CHLORIDE CRYS ER 20 MEQ PO TBCR
EXTENDED_RELEASE_TABLET | ORAL | Status: AC
  Filled 2023-03-26: qty 2

## 2023-03-26 MED ORDER — BLOOD GLUCOSE MONITORING SUPPL DEVI: 1.0000 | Freq: Three times a day (TID) | 0 refills | Status: DC

## 2023-03-26 MED ORDER — POTASSIUM CHLORIDE CRYS ER 20 MEQ PO TBCR
40.0000 meq | EXTENDED_RELEASE_TABLET | Freq: Once | ORAL | Status: AC
  Administered 2023-03-26: 40 meq via ORAL

## 2023-03-26 MED ORDER — POTASSIUM CHLORIDE CRYS ER 20 MEQ PO TBCR: 20.0000 meq | EXTENDED_RELEASE_TABLET | Freq: Every day | ORAL | 0 refills | Status: AC

## 2023-03-26 MED ORDER — LANCET DEVICE MISC: 1.0000 | Freq: Three times a day (TID) | 0 refills | Status: AC

## 2023-03-26 MED ORDER — WARFARIN SODIUM 5 MG PO TABS: 5.0000 mg | ORAL_TABLET | ORAL | Status: DC

## 2023-03-26 MED ORDER — SODIUM CHLORIDE 0.9% FLUSH: 3.0000 mL | Freq: Two times a day (BID) | INTRAVENOUS | Status: DC

## 2023-03-26 MED ORDER — LANCETS MISC. MISC: 1.0000 | Freq: Three times a day (TID) | 0 refills | Status: AC

## 2023-03-26 NOTE — Interval H&P Note (Signed)
 History and Physical Interval Note:  03/26/2023 11:16 AM  Kimberly Hobbs  has presented today for surgery, with the diagnosis of afib.  The various methods of treatment have been discussed with the patient and family. After consideration of risks, benefits and other options for treatment, the patient has consented to  Procedure(s): CARDIOVERSION (N/A) as a surgical intervention.  The patient's history has been reviewed, patient examined, no change in status, stable for surgery.  I have reviewed the patient's chart and labs.  Questions were answered to the patient's satisfaction.     [name]

## 2023-03-26 NOTE — Anesthesia Preprocedure Evaluation (Addendum)
 Anesthesia Evaluation  Patient identified by MRN, date of birth, ID band Patient awake    Reviewed: Allergy  & Precautions, H&P , NPO status , Patient's Chart, lab work & pertinent test results  Airway Mallampati: II  TM Distance: >3 FB Neck ROM: Full    Dental no notable dental hx. (+) Upper Dentures   Pulmonary sleep apnea , COPD, former smoker   Pulmonary exam normal breath sounds clear to auscultation       Cardiovascular hypertension, + CAD, + CABG and +CHF  Normal cardiovascular exam+ dysrhythmias Atrial Fibrillation  Rhythm:Regular Rate:Normal  ICD   Neuro/Psych  PSYCHIATRIC DISORDERS Anxiety     negative neurological ROS     GI/Hepatic Neg liver ROS,GERD  ,,  Endo/Other  diabetes    Renal/GU CRFRenal disease  negative genitourinary   Musculoskeletal  (+) Arthritis ,    Abdominal   Peds negative pediatric ROS (+)  Hematology  (+) Blood dyscrasia, anemia   Anesthesia Other Findings   Reproductive/Obstetrics negative OB ROS                              Anesthesia Physical Anesthesia Plan  ASA: 4  Anesthesia Plan: General   Post-op Pain Management:    Induction: Intravenous  PONV Risk Score and Plan: 1 and Treatment may vary due to age or medical condition  Airway Management Planned: Natural Airway  Additional Equipment: None  Intra-op Plan:   Post-operative Plan:   Informed Consent: I have reviewed the patients History and Physical, chart, labs and discussed the procedure including the risks, benefits and alternatives for the proposed anesthesia with the patient or authorized representative who has indicated his/her understanding and acceptance.     Dental advisory given  Plan Discussed with: CRNA  Anesthesia Plan Comments:          Anesthesia Quick Evaluation

## 2023-03-26 NOTE — Progress Notes (Signed)
 Spoke to pt and instructed them to come at 1200 and to be NPO after 0000.  Confirmed no missed doses of AC and instructed to take in AM with a small sip of water.  Confirmed that pt will have a ride home and someone to stay with them for 24 hours after the procedure. Instructed patient to not wear any jewelry or lotion.

## 2023-03-26 NOTE — Patient Instructions (Signed)
 Description   Called and spoke with pt. Instructed pt to START taking 5mg  daily except 7.5mg  on Wednesday, and Friday.  Recheck INR on Tuesday in [facility]  Anticoagulation Clinic [phone]  1/31/ Amio increased 400mg  daily

## 2023-03-26 NOTE — CV Procedure (Signed)
 Patient presented for cardioversion today due to atrial fibrillation.  Unfortunately potassium 3.1.  She is not on supplemental potassium at home despite being on a diuretic.  Will cancel procedure to today and give her K-Dur 40 mEq now and repeat again tonight.  Starting tomorrow we will start K-Dur 20 mEq daily and repeat bmet in 1 week  She has been rescheduled for tomorrow and will need a stat NR and i-STAT prior to her procedure

## 2023-03-27 ENCOUNTER — Encounter (HOSPITAL_COMMUNITY)
Admission: RE | Disposition: A | Discharge status: Home / Self Care | Payer: Self-pay | Source: Ambulatory Visit | Attending: Internal Medicine

## 2023-03-27 ENCOUNTER — Ambulatory Visit: Payer: 59

## 2023-03-27 ENCOUNTER — Ambulatory Visit: Payer: 59 | Admitting: Nurse Practitioner

## 2023-03-27 ENCOUNTER — Ambulatory Visit (HOSPITAL_COMMUNITY): Payer: 59 | Admitting: Anesthesiology

## 2023-03-27 ENCOUNTER — Encounter: Payer: Self-pay | Admitting: Family Medicine

## 2023-03-27 ENCOUNTER — Ambulatory Visit (HOSPITAL_COMMUNITY)
Admission: RE | Admit: 2023-03-27 | Discharge: 2023-03-27 | Disposition: A | Discharge status: Home / Self Care | Payer: 59 | Source: Ambulatory Visit | Attending: Internal Medicine | Admitting: Internal Medicine

## 2023-03-27 DIAGNOSIS — I129 Hypertensive chronic kidney disease with stage 1 through stage 4 chronic kidney disease, or unspecified chronic kidney disease: Secondary | ICD-10-CM | POA: Diagnosis not present

## 2023-03-27 DIAGNOSIS — I4891 Unspecified atrial fibrillation: Secondary | ICD-10-CM

## 2023-03-27 DIAGNOSIS — I509 Heart failure, unspecified: Secondary | ICD-10-CM

## 2023-03-27 DIAGNOSIS — Z952 Presence of prosthetic heart valve: Secondary | ICD-10-CM | POA: Diagnosis not present

## 2023-03-27 DIAGNOSIS — J441 Chronic obstructive pulmonary disease with (acute) exacerbation: Secondary | ICD-10-CM | POA: Insufficient documentation

## 2023-03-27 DIAGNOSIS — Z95810 Presence of automatic (implantable) cardiac defibrillator: Secondary | ICD-10-CM | POA: Diagnosis not present

## 2023-03-27 DIAGNOSIS — N183 Chronic kidney disease, stage 3 unspecified: Secondary | ICD-10-CM | POA: Insufficient documentation

## 2023-03-27 DIAGNOSIS — E785 Hyperlipidemia, unspecified: Secondary | ICD-10-CM | POA: Insufficient documentation

## 2023-03-27 DIAGNOSIS — Z951 Presence of aortocoronary bypass graft: Secondary | ICD-10-CM | POA: Insufficient documentation

## 2023-03-27 DIAGNOSIS — Z79899 Other long term (current) drug therapy: Secondary | ICD-10-CM | POA: Insufficient documentation

## 2023-03-27 DIAGNOSIS — I472 Ventricular tachycardia, unspecified: Secondary | ICD-10-CM | POA: Diagnosis not present

## 2023-03-27 DIAGNOSIS — E1122 Type 2 diabetes mellitus with diabetic chronic kidney disease: Secondary | ICD-10-CM | POA: Insufficient documentation

## 2023-03-27 DIAGNOSIS — I4892 Unspecified atrial flutter: Secondary | ICD-10-CM | POA: Diagnosis not present

## 2023-03-27 DIAGNOSIS — D6869 Other thrombophilia: Secondary | ICD-10-CM | POA: Insufficient documentation

## 2023-03-27 DIAGNOSIS — Z87891 Personal history of nicotine dependence: Secondary | ICD-10-CM | POA: Diagnosis not present

## 2023-03-27 DIAGNOSIS — I4819 Other persistent atrial fibrillation: Secondary | ICD-10-CM | POA: Diagnosis not present

## 2023-03-27 DIAGNOSIS — K219 Gastro-esophageal reflux disease without esophagitis: Secondary | ICD-10-CM | POA: Insufficient documentation

## 2023-03-27 DIAGNOSIS — I25110 Atherosclerotic heart disease of native coronary artery with unstable angina pectoris: Secondary | ICD-10-CM | POA: Diagnosis not present

## 2023-03-27 DIAGNOSIS — I130 Hypertensive heart and chronic kidney disease with heart failure and stage 1 through stage 4 chronic kidney disease, or unspecified chronic kidney disease: Secondary | ICD-10-CM | POA: Diagnosis not present

## 2023-03-27 DIAGNOSIS — Z7901 Long term (current) use of anticoagulants: Secondary | ICD-10-CM | POA: Insufficient documentation

## 2023-03-27 DIAGNOSIS — I480 Paroxysmal atrial fibrillation: Secondary | ICD-10-CM

## 2023-03-27 DIAGNOSIS — I2510 Atherosclerotic heart disease of native coronary artery without angina pectoris: Secondary | ICD-10-CM | POA: Diagnosis not present

## 2023-03-27 DIAGNOSIS — Z01818 Encounter for other preprocedural examination: Secondary | ICD-10-CM

## 2023-03-27 HISTORY — PX: CARDIOVERSION: EP1203

## 2023-03-27 LAB — BASIC METABOLIC PANEL
Anion gap: 14 (ref 5–15)
BUN: 25 mg/dL — ABNORMAL HIGH (ref 8–23)
CO2: 23 mmol/L (ref 22–32)
Calcium: 9.7 mg/dL (ref 8.9–10.3)
Chloride: 104 mmol/L (ref 98–111)
Creatinine, Ser: 1.48 mg/dL — ABNORMAL HIGH (ref 0.44–1.00)
GFR, Estimated: 40 mL/min — ABNORMAL LOW (ref >60)
Glucose, Bld: 114 mg/dL — ABNORMAL HIGH (ref 70–99)
Potassium: 4 mmol/L (ref 3.5–5.1)
Sodium: 141 mmol/L (ref 135–145)

## 2023-03-27 LAB — GLUCOSE, CAPILLARY: Glucose-Capillary: 74 mg/dL (ref 70–99)

## 2023-03-27 LAB — PROTIME-INR
INR: 3.1 — ABNORMAL HIGH (ref 0.8–1.2)
Prothrombin Time: 32.5 s — ABNORMAL HIGH (ref 11.4–15.2)

## 2023-03-27 SURGERY — CARDIOVERSION (CATH LAB)
Anesthesia: General

## 2023-03-27 MED ORDER — FENTANYL CITRATE (PF) 100 MCG/2ML IJ SOLN
25.0000 ug | INTRAMUSCULAR | Status: DC | PRN
Start: 2023-03-27 — End: 2023-03-27

## 2023-03-27 MED ORDER — SODIUM CHLORIDE 0.9% FLUSH: 3.0000 mL | Freq: Two times a day (BID) | INTRAVENOUS | Status: DC

## 2023-03-27 MED ORDER — DROPERIDOL 2.5 MG/ML IJ SOLN: 0.6250 mg | Freq: Once | INTRAMUSCULAR | Status: DC | PRN

## 2023-03-27 MED ORDER — ACETAMINOPHEN 10 MG/ML IV SOLN: 1000.0000 mg | Freq: Once | INTRAVENOUS | Status: DC | PRN

## 2023-03-27 MED ORDER — SODIUM CHLORIDE 0.9% FLUSH: 3.0000 mL | INTRAVENOUS | Status: DC | PRN

## 2023-03-27 MED ORDER — LIDOCAINE 2% (20 MG/ML) 5 ML SYRINGE
INTRAMUSCULAR | Status: DC | PRN
  Administered 2023-03-27: 50 mg via INTRAVENOUS

## 2023-03-27 MED ORDER — OXYCODONE HCL 5 MG/5ML PO SOLN: 5.0000 mg | Freq: Once | ORAL | Status: DC | PRN

## 2023-03-27 MED ORDER — OXYCODONE HCL 5 MG PO TABS: 5.0000 mg | ORAL_TABLET | Freq: Once | ORAL | Status: DC | PRN

## 2023-03-27 MED ORDER — PROPOFOL 10 MG/ML IV BOLUS
INTRAVENOUS | Status: DC | PRN
  Administered 2023-03-27: 60 mg via INTRAVENOUS

## 2023-03-27 SURGICAL SUPPLY — 1 items: PAD DEFIB RADIO PHYSIO CONN (PAD) ×1 IMPLANT

## 2023-03-27 NOTE — Anesthesia Preprocedure Evaluation (Addendum)
 Anesthesia Evaluation  Patient identified by MRN, date of birth, ID band Patient awake    Reviewed: Allergy  & Precautions, NPO status , Patient's Chart, lab work & pertinent test results  Airway Mallampati: II   Neck ROM: full    Dental   Pulmonary sleep apnea , COPD, former smoker   breath sounds clear to auscultation       Cardiovascular hypertension, + CAD and +CHF  + dysrhythmias Atrial Fibrillation + Cardiac Defibrillator + Valvular Problems/Murmurs  Rhythm:irregular Rate:Normal  Mechanical AVR, mechanical MVR both with normal function. EF 55-60%.   Neuro/Psych  PSYCHIATRIC DISORDERS Anxiety      Neuromuscular disease    GI/Hepatic ,GERD  ,,  Endo/Other  diabetes, Type 2    Renal/GU      Musculoskeletal  (+) Arthritis ,    Abdominal   Peds  Hematology   Anesthesia Other Findings   Reproductive/Obstetrics                             Anesthesia Physical Anesthesia Plan  ASA: 4  Anesthesia Plan: General   Post-op Pain Management:    Induction: Intravenous  PONV Risk Score and Plan: 3 and Propofol  infusion and Treatment may vary due to age or medical condition  Airway Management Planned: Mask  Additional Equipment:   Intra-op Plan:   Post-operative Plan:   Informed Consent: I have reviewed the patients History and Physical, chart, labs and discussed the procedure including the risks, benefits and alternatives for the proposed anesthesia with the patient or authorized representative who has indicated his/her understanding and acceptance.     Dental advisory given  Plan Discussed with: CRNA, Anesthesiologist and Surgeon  Anesthesia Plan Comments:        Anesthesia Quick Evaluation

## 2023-03-27 NOTE — Transfer of Care (Signed)
 Immediate Anesthesia Transfer of Care Note  Patient: Kimberly Hobbs  Procedure(s) Performed: CARDIOVERSION  Patient Location: PACU and Cath Lab  Anesthesia Type:General  Level of Consciousness: awake, alert , oriented, and patient cooperative  Airway & Oxygen Therapy: Patient Spontanous Breathing and Patient connected to nasal cannula oxygen  Post-op Assessment: Report given to RN and Post -op Vital signs reviewed and stable  Post vital signs: Reviewed and stable  Last Vitals:  Vitals Value Taken Time  BP    Temp    Pulse    Resp    SpO2      Last Pain:  Vitals:   03/27/23 1200  TempSrc: Temporal  PainSc: 0-No pain         Complications: No notable events documented.

## 2023-03-27 NOTE — CV Procedure (Signed)
 Procedure: Electrical Cardioversion Indications:  Atrial Fibrillation  Procedure Details:  Consent: Risks of procedure as well as the alternatives and risks of each were explained to the (patient/caregiver).  Consent for procedure obtained.  Time Out: Verified patient identification, verified procedure, site/side was marked, verified correct patient position, special equipment/implants available, medications/allergies/relevent history reviewed, required imaging and test results available. PERFORMED.  Patient placed on cardiac monitor, pulse oximetry, supplemental oxygen as necessary.  Sedation given:  See anesthesia note Pacer pads placed anterior and posterior chest.  Cardioverted 1 time(s).  Cardioversion with synchronized biphasic 360J shock.  Evaluation: Findings: Post procedure EKG shows: A paced V paced.  Sinus rhythm Complications: None Patient did tolerate procedure well.  Time Spent Directly with the Patient:  15 minutes   Kimberly Hobbs 03/27/2023, 2:31 PM

## 2023-03-27 NOTE — Interval H&P Note (Signed)
 History and Physical Interval Note:  03/27/2023 12:11 PM  Kimberly Hobbs  has presented today for surgery, with the diagnosis of afib.  The various methods of treatment have been discussed with the patient and family. After consideration of risks, benefits and other options for treatment, the patient has consented to  Procedure(s): CARDIOVERSION (N/A) as a surgical intervention.  The patient's history has been reviewed, patient examined, no change in status, stable for surgery.  I have reviewed the patient's chart and labs.  Questions were answered to the patient's satisfaction.     Alvan Ronal BRAVO

## 2023-03-28 ENCOUNTER — Encounter (HOSPITAL_COMMUNITY): Payer: Self-pay | Admitting: Internal Medicine

## 2023-03-28 NOTE — Anesthesia Postprocedure Evaluation (Signed)
 Anesthesia Post Note  Patient: Kimberly Hobbs  Procedure(s) Performed: CARDIOVERSION     Patient location during evaluation: Cath Lab Anesthesia Type: General Level of consciousness: awake and alert Pain management: pain level controlled Vital Signs Assessment: post-procedure vital signs reviewed and stable Respiratory status: spontaneous breathing, nonlabored ventilation, respiratory function stable and patient connected to nasal cannula oxygen Cardiovascular status: blood pressure returned to baseline and stable Postop Assessment: no apparent nausea or vomiting Anesthetic complications: no   No notable events documented.  Last Vitals:  Vitals:   03/27/23 1434 03/27/23 1435  BP: (!) 92/51 (!) 86/42  Pulse: 60 60  Resp: 17 17  Temp:    SpO2: 95% 95%    Last Pain:  Vitals:   03/27/23 1416  TempSrc: Temporal  PainSc: 0-No pain                 Brigg [place] S

## 2023-03-31 ENCOUNTER — Telehealth: Payer: Self-pay | Admitting: Cardiology

## 2023-03-31 DIAGNOSIS — Z952 Presence of prosthetic heart valve: Secondary | ICD-10-CM | POA: Diagnosis not present

## 2023-03-31 DIAGNOSIS — I480 Paroxysmal atrial fibrillation: Secondary | ICD-10-CM | POA: Diagnosis not present

## 2023-03-31 DIAGNOSIS — Z7901 Long term (current) use of anticoagulants: Secondary | ICD-10-CM | POA: Diagnosis not present

## 2023-03-31 NOTE — Telephone Encounter (Signed)
 Patient c/o Palpitations:  STAT if patient reporting lightheadedness, shortness of breath, or chest pain  How long have you had palpitations/irregular HR/ Afib? Are you having the symptoms now? Patient says she is in Afib at this time  Are you currently experiencing lightheadedness, SOB or CP? no  Do you have a history of afib (atrial fibrillation) or irregular heart rhythm?   Have you checked your BP or HR? (document readings if available):   Are you experiencing any other symptoms? No-patient wanted to be seen- I made her an appointment with Ottie Blonder for Thursday(04-03-23)

## 2023-03-31 NOTE — Telephone Encounter (Signed)
 Spoke with patient states she had cardioversion last week and now she is back in AFIB. Denies chest pain, states she has SOB at times with exertion. HR currently 110.   She has appointment scheduled with EP APP.   Advised patient she can take her dose of metoprolol  a little early to help bring her HR down. She is taking medications as prescribed. Informed her to continue taking medications as prescribed. Monitor for now and stay hydrated.  Please see previous encounter.

## 2023-04-01 ENCOUNTER — Ambulatory Visit: Payer: 59 | Attending: Cardiology

## 2023-04-01 DIAGNOSIS — Z5181 Encounter for therapeutic drug level monitoring: Secondary | ICD-10-CM | POA: Diagnosis not present

## 2023-04-01 DIAGNOSIS — I480 Paroxysmal atrial fibrillation: Secondary | ICD-10-CM

## 2023-04-01 LAB — POCT INR: INR: 4.9 — AB (ref 2.0–3.0)

## 2023-04-01 NOTE — Patient Instructions (Addendum)
Description   HOLD today's dose and then START  taking 5mg  daily.  Stay consistent with greens each week (1 per week)  Recheck INR in 1 week.  Anticoagulation Clinic [phone]  03/21/23 Amio increased 400mg  daily

## 2023-04-02 DIAGNOSIS — G4733 Obstructive sleep apnea (adult) (pediatric): Secondary | ICD-10-CM | POA: Diagnosis not present

## 2023-04-02 NOTE — Progress Notes (Unsigned)
Electrophysiology Clinic Note    Date:  04/03/2023  Patient ID:  Kimberly, Hobbs 11/11/1960, MRN [id number] PCP:  [name], PA  Cardiologist:  Thomasene Ripple, DO Electrophysiologist: Regan Lemming, MD   Discussed the use of AI scribe software for clinical note transcription with the patient, who gave verbal consent to proceed.   Patient Profile    Chief Complaint: afib follow-up  History of Present Illness: Kimberly Hobbs is a 63 y.o. female with PMH notable for CAD, valvular heart disease (rheumatic) s/p CABG, mechanical AVR, MVR, persis AFib, flutter, COPD, OSA on CPAP, HTN, T2DM, CKD; seen today for Kimberly Jorja Loa, MD for acute visit due to afib. She is s/p redo AF ablation with PVI, posterior wall on 08/2022 by Dr. Elberta Fortis. She was started on amiodarone after ablation for recurrence of Afib. Amio was stopped 12/2022 to allow initiation of tikosyn.  She presented to [facility] ER 01/2023 in wide-complex tachycardia that did not break with adenosine > sedated and DCCV with return to NSR. She has recurrence of VT 12/17 requiring bedside DCCV > AFib.  She had EPS 12/17 without inducible VT, not VT ablation candidate d/t mechanical valves. Amiodarone was restarted. Cardiac MRI with LVEF 40%, dilated main pulm arter. She had ICD implant 12/23.   She saw PA Keitha Butte 1/31 with persistent Aflutter episode, and appeared to be having COPD exacerbation. Tried to pace-terminate, unsuccessful. Amio was increased to 400mg  BID and scheduled for DCCV. She is s/p successful DCCV 2/6 with return to SR. She messaged clinic 2/10 that she had converted back to AFib.   On follow-up today, she has continued to have paroxysmal AFib episodes that are very bothersome to quality of life. Longest episode has been about 4 hours. They are especially bothersome to her at night when she can feel her heart beating very fast and is having difficulty sleeping.  She is having ongoing SOB, was winded  walking from parking spot to the building, but not SOB walking from waiting room to exam room. She has continued to take amiodarone 400mg  BID since seeing PA Ursuy.  She has home INR monitor, states her INR is now 3.5 by home monitor. She is having ongoing constipation, having scant bleeding with BM.   She denies increased lower extremity edema, abd bloating or increase fluid.  She denies chest pain, chest pressure. No dizziness, syncope, or presyncope.    Device Information: MDT dual chamber ICD, imp 01/2023; dx VT  AAD History: Amidoarone     ROS:  Please see the history of present illness. All other systems are reviewed and otherwise negative.    Physical Exam    VS:  BP 136/76 (BP Location: Left Arm, Patient Position: Sitting, Cuff Size: Large)   Pulse 60   Ht 5\' 5"  (1.651 m)   Wt 204 lb (92.5 kg)   LMP 06/20/2015   SpO2 96%   BMI 33.95 kg/m  BMI: Body mass index is 33.95 kg/m.  Wt Readings from Last 3 Encounters:  04/03/23 204 lb (92.5 kg)  03/27/23 200 lb (90.7 kg)  03/26/23 200 lb (90.7 kg)     GEN- The patient is well appearing, alert and oriented x 3 today.   Lungs- Clear to ausculation bilaterally, normal work of breathing.  Heart- Regular rate and rhythm, valvular click appreciated, no rubs or gallops Extremities- No peripheral edema, warm, dry Skin-  device pocket well-healed, no tethering. Keloid noted on incision   Device  interrogation done today and reviewed by myself:  Battery 11+ years Lead thresholds, impedence, sensing stable  Freq AFib episode, overall burden about 9.5% since 2/6 Blunted histograms Turned on RR    Studies Reviewed   Previous EP, cardiology notes.    EKG is ordered. Personal review of EKG from today shows:    EKG Interpretation Date/Time:  Thursday April 03 2023 14:10:31 EST Ventricular Rate:  60 PR Interval:  280 QRS Duration:  122 QT Interval:  476 QTC Calculation: 476 R Axis:   -45  Text  Interpretation: Atrial-paced rhythm with prolonged AV conduction Left axis deviation Non-specific intra-ventricular conduction delay Minimal voltage criteria for LVH, may be normal variant ( Cornell product ) Confirmed by [name] [phone]) on 04/03/2023 3:46:32 PM    Cardiac MRI, 02/05/2023 1. Normal RV size with moderate systolic dysfunction (EF 39%). There is regional RV dyskinesis near apex. Given regional RV dyskinesis and RVEF <40%, would meet 1 major imaging criteria for ARVC (though no RV dilatation)  2.  Normal LV size with mild systolic dysfunction (EF 40%)  3. RV insertion site LGE, which is a nonspecific scar finding often seen in setting of elevated pulmonary pressures  4.  Dilated main pulmonary artery measuring 33mm  EPS, 02/04/2023 1.  Wide-complex tachycardia upon presentation.  2.  Atrial fibrillation cardioverted to sinus rhythm 3.  Negative EP study  TTE, 02/03/2023  1. Left ventricular ejection fraction, by estimation, is 55 to 60%. The left ventricle has normal function. The left ventricle has no regional wall motion abnormalities. Left ventricular diastolic parameters are indeterminate.   2. Right ventricular systolic function is mildly reduced. The right ventricular size is normal.   3. Left atrial size was mildly dilated.   4. The mitral valve has been repaired/replaced. No evidence of mitral valve regurgitation. No evidence of mitral stenosis. The mean mitral valve gradient is 3.0 mmHg. Echo findings are consistent with normal structure and function of the mitral valve prosthesis.   5. The aortic valve has been repaired/replaced. There is moderate calcification of the aortic valve. Aortic valve regurgitation is not visualized. Mild aortic valve stenosis. Aortic valve area, by VTI measures 1.34 cm. Aortic valve mean gradient measures 19.5 mmHg. Aortic valve Vmax measures 3.00 m/s.   6. The inferior vena cava is normal in size with greater than 50% respiratory  variability, suggesting right atrial pressure of 3 mmHg.   Conclusion(s)/Recommendation(s): Gradient through mechanical aortic valve mildly elevated but no change since echo 2/24.   Cardiac CT, 08/29/2022 1. There is normal pulmonary vein drainage into the left atrium with ostial measurements above.  2. There is no thrombus in the left atrial appendage.  3. The esophagus runs in the left atrial midline and is not in proximity to any of the pulmonary vein ostia.  4. No PFO/ASD.  5. Normal coronary origin. Right dominance.  6. CAC score of 922 which is 99 percentile for age-, race-, and sex-matched controls.    Assessment and Plan     #) persis AFib #) Aflutter #) high-risk medication use S/p ablation 08/2022  S/p DCCV 2/6 with ERAF  Continues to have paroxysmal episodes, though overall burden appears lower than previous (9.5% compared to 43%) Decrease amiodarone to 200mg  BID x 14 days, then reduce to 200mg  daily Most recent LFTs and thyroid labs stable Discussed with Dr. Elberta Fortis, who recommended repeat ablation Patient is agreeable to this Kimberly tentatively schedule end of March or later (  #) Hypercoag d/t  persis afib CHA2DS2-VASc Score = at least 5 [CHF History: 1, HTN History: 1, Diabetes History: 1, Stroke History: 0, Vascular Disease History: 1, Age Score: 0, Gender Score: 1].  Therefore, the patient's annual risk of stroke is 7.2 %.    Stroke ppx - warfarin, managed at [facility] office.  Has been therapeutic or supra-therapeutic as of late Slight bleeding with BM  #) VT #) ICD in situ No VT episodes Device functioning well, see paceart for details Turned on RR   #) health-related anxiety Reassurance provided about symptomatic AFib episodes much lower burden, and no VT episodes Refer to Cardiology psych, Dr. Bosie Clos  #) constipation #) hemorrhoidal bleeding Patient has been taking miralax every few days. Recommended she increase it to daily for the time being        Current medicines are reviewed at length with the patient today.   The patient has concerns regarding her medicines.  The following changes were made today:   REDUCE amiodarone to 200mg  BID x 14 days, then 200mg  daily  Labs/ tests ordered today include:  Orders Placed This Encounter  Procedures   EKG 12-Lead     Disposition: Follow up with Dr. Elberta Fortis or EP APP  6 weeks  for 91d post-ICD implant appt, and pre-op for AF ablation   Signed, [name], NP  04/03/23  3:46 PM  Electrophysiology [facility]

## 2023-04-03 ENCOUNTER — Ambulatory Visit: Payer: 59 | Attending: Cardiology | Admitting: Cardiology

## 2023-04-03 ENCOUNTER — Encounter: Payer: Self-pay | Admitting: Cardiology

## 2023-04-03 VITALS — BP 136/76 | HR 60 | Ht 65.0 in | Wt 204.0 lb

## 2023-04-03 DIAGNOSIS — I4892 Unspecified atrial flutter: Secondary | ICD-10-CM | POA: Diagnosis not present

## 2023-04-03 DIAGNOSIS — Z79899 Other long term (current) drug therapy: Secondary | ICD-10-CM

## 2023-04-03 DIAGNOSIS — Z95810 Presence of automatic (implantable) cardiac defibrillator: Secondary | ICD-10-CM | POA: Diagnosis not present

## 2023-04-03 DIAGNOSIS — I4819 Other persistent atrial fibrillation: Secondary | ICD-10-CM

## 2023-04-03 DIAGNOSIS — R4589 Other symptoms and signs involving emotional state: Secondary | ICD-10-CM

## 2023-04-03 DIAGNOSIS — R3915 Urgency of urination: Secondary | ICD-10-CM | POA: Diagnosis not present

## 2023-04-03 DIAGNOSIS — N390 Urinary tract infection, site not specified: Secondary | ICD-10-CM | POA: Diagnosis not present

## 2023-04-03 DIAGNOSIS — I472 Ventricular tachycardia, unspecified: Secondary | ICD-10-CM

## 2023-04-03 DIAGNOSIS — D6869 Other thrombophilia: Secondary | ICD-10-CM

## 2023-04-03 LAB — CUP PACEART INCLINIC DEVICE CHECK
Date Time Interrogation Session: 20250213161339
Implantable Lead Connection Status: 753985
Implantable Lead Connection Status: 753985
Implantable Lead Implant Date: 20241223
Implantable Lead Implant Date: 20241223
Implantable Lead Location: 753859
Implantable Lead Location: 753860
Implantable Lead Model: 5076
Implantable Lead Model: 5076
Implantable Pulse Generator Implant Date: 20241223

## 2023-04-03 MED ORDER — AMIODARONE HCL 200 MG PO TABS: ORAL_TABLET | ORAL | 1 refills | Status: DC

## 2023-04-03 NOTE — Patient Instructions (Addendum)
Medication Instructions:  Decrease Amiodarone to 200 mg twice daily for 2 weeks, then decrease to 200 mg daily.   *If you need a refill on your cardiac medications before your next appointment, please call your pharmacy*   Testing/Procedures:  Follow instruction sheet via mychart    Follow-Up: At [facility], you and your health needs are our priority.  As part of our continuing mission to provide you with exceptional heart care, we have created designated Provider Care Teams.  These Care Teams include your primary Cardiologist (physician) and Advanced Practice Providers (APPs -  Physician Assistants and Nurse Practitioners) who all work together to provide you with the care you need, when you need it.  We recommend signing up for the patient portal called "MyChart".  Sign up information is provided on this After Visit Summary.  MyChart is used to connect with patients for Virtual Visits (Telemedicine).  Patients are able to view lab/test results, encounter notes, upcoming appointments, etc.  Non-urgent messages can be sent to your provider as well.   To learn more about what you can do with MyChart, go to [email or web address].    Your next appointment:   Keep follow ups scheduled with [facility]

## 2023-04-04 ENCOUNTER — Other Ambulatory Visit (HOSPITAL_COMMUNITY): Payer: Self-pay

## 2023-04-04 ENCOUNTER — Telehealth: Payer: Self-pay | Admitting: Cardiology

## 2023-04-04 NOTE — Telephone Encounter (Signed)
  1. Has your device fired? No   2. Is you device beeping? No   3. Are you experiencing draining or swelling at device site? No   4. Are you calling to see if we received your device transmission? No   5. Have you passed out? No  2/13 Pt came in and there were adjustments made to her Pacemaker and pt has been in afib ever since she left   Please route to Device Clinic Pool

## 2023-04-04 NOTE — Telephone Encounter (Signed)
Called and reassured pt that its not what was programmed.
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
## 2023-04-08 ENCOUNTER — Ambulatory Visit (HOSPITAL_COMMUNITY): Payer: 59 | Admitting: Physician Assistant

## 2023-04-08 ENCOUNTER — Ambulatory Visit: Payer: 59 | Attending: Cardiology

## 2023-04-08 DIAGNOSIS — Z5181 Encounter for therapeutic drug level monitoring: Secondary | ICD-10-CM | POA: Diagnosis not present

## 2023-04-08 DIAGNOSIS — I480 Paroxysmal atrial fibrillation: Secondary | ICD-10-CM | POA: Diagnosis not present

## 2023-04-08 LAB — POCT INR: INR: 2.5 (ref 2.0–3.0)

## 2023-04-08 NOTE — Patient Instructions (Addendum)
Description   Continue taking 5mg  daily.  Stay consistent with greens each week (1 per week)  Recheck INR in 1 week.  Anticoagulation Clinic [phone]  *Amiodarone to 200mg  BID x 14 days, then 200mg  daily - will decreased to 200mg  daily on 04/07/23*

## 2023-04-09 ENCOUNTER — Ambulatory Visit: Payer: 59 | Admitting: Student

## 2023-04-11 ENCOUNTER — Ambulatory Visit: Payer: 59 | Attending: Nurse Practitioner | Admitting: Nurse Practitioner

## 2023-04-11 ENCOUNTER — Other Ambulatory Visit: Payer: Self-pay

## 2023-04-11 ENCOUNTER — Encounter: Payer: Self-pay | Admitting: Nurse Practitioner

## 2023-04-11 VITALS — BP 102/58 | HR 84 | Ht 65.0 in | Wt 201.0 lb

## 2023-04-11 DIAGNOSIS — I10 Essential (primary) hypertension: Secondary | ICD-10-CM | POA: Diagnosis not present

## 2023-04-11 DIAGNOSIS — G4733 Obstructive sleep apnea (adult) (pediatric): Secondary | ICD-10-CM

## 2023-04-11 DIAGNOSIS — I472 Ventricular tachycardia, unspecified: Secondary | ICD-10-CM | POA: Diagnosis not present

## 2023-04-11 DIAGNOSIS — I2510 Atherosclerotic heart disease of native coronary artery without angina pectoris: Secondary | ICD-10-CM

## 2023-04-11 DIAGNOSIS — I255 Ischemic cardiomyopathy: Secondary | ICD-10-CM

## 2023-04-11 DIAGNOSIS — N183 Chronic kidney disease, stage 3 unspecified: Secondary | ICD-10-CM

## 2023-04-11 DIAGNOSIS — E1122 Type 2 diabetes mellitus with diabetic chronic kidney disease: Secondary | ICD-10-CM | POA: Diagnosis not present

## 2023-04-11 DIAGNOSIS — E785 Hyperlipidemia, unspecified: Secondary | ICD-10-CM | POA: Diagnosis not present

## 2023-04-11 DIAGNOSIS — I4819 Other persistent atrial fibrillation: Secondary | ICD-10-CM

## 2023-04-11 DIAGNOSIS — Z952 Presence of prosthetic heart valve: Secondary | ICD-10-CM

## 2023-04-11 NOTE — Patient Instructions (Signed)
Medication Instructions:  Your physician recommends that you continue on your current medications as directed. Please refer to the Current Medication list given to you today.  *If you need a refill on your cardiac medications before your next appointment, please call your pharmacy*   Lab Work: NONE ordered at this time of appointment    Testing/Procedures: NONE ordered at this time of appointment     Follow-Up: At [facility], you and your health needs are our priority.  As part of our continuing mission to provide you with exceptional heart care, we have created designated Provider Care Teams.  These Care Teams include your primary Cardiologist (physician) and Advanced Practice Providers (APPs -  Physician Assistants and Nurse Practitioners) who all work together to provide you with the care you need, when you need it.  We recommend signing up for the patient portal called "MyChart".  Sign up information is provided on this After Visit Summary.  MyChart is used to connect with patients for Virtual Visits (Telemedicine).  Patients are able to view lab/test results, encounter notes, upcoming appointments, etc.  Non-urgent messages can be sent to your provider as well.   To learn more about what you can do with MyChart, go to [email or web address].    Your next appointment:   6 month(s)  Provider:   Thomasene Ripple, DO

## 2023-04-11 NOTE — Progress Notes (Signed)
 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  Past Medical History    Past Medical History:  Diagnosis Date   Allergy ?   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 01/01/2017 01/01/2017   Past Surgical History:  Procedure Laterality Date   ATRIAL FIBRILLATION ABLATION N/A 09/05/2022   Procedure: ATRIAL FIBRILLATION ABLATION;  Surgeon: Regan Lemming, MD;  Location: [facility];  Service: Cardiovascular;  Laterality: N/A;   CARDIAC CATHETERIZATION     CARDIAC VALVE REPLACEMENT     CARDIOVERSION N/A 04/02/2022   Procedure: CARDIOVERSION;  Surgeon: Elder Negus, MD;  Location: [facility]OS[facility];  Service: Cardiovascular;  Laterality: N/A;   CARDIOVERSION N/A 04/27/2022   Procedure: CARDIOVERSION;  Surgeon: Elder Negus, MD;  Location: [facility];  Service: Cardiovascular;   Laterality: N/A;   CARDIOVERSION N/A 03/27/2023   Procedure: CARDIOVERSION;  Surgeon: Maisie Fus, MD;  Location: [facility];  Service: Cardiovascular;  Laterality: N/A;   CHOLECYSTECTOMY     CORONARY ARTERY BYPASS GRAFT     CORONARY/GRAFT ANGIOGRAPHY N/A 08/18/2018   Procedure: CORONARY/GRAFT ANGIOGRAPHY;  Surgeon: Elder Negus, MD;  Location: [facility];  Service: Cardiovascular;  Laterality: N/A;   ELECTROPHYSIOLOGY STUDY N/A 02/04/2023   Procedure: ELECTROPHYSIOLOGY STUDY;  Surgeon: Regan Lemming, MD;  Location: [facility];  Service: Cardiovascular;  Laterality: N/A;   ICD IMPLANT N/A 02/10/2023   Procedure: ICD IMPLANT;  Surgeon: Regan Lemming, MD;  Location: [facility] INVASIVE CV LAB;  Service: Cardiovascular;  Laterality: N/A;   LEG SURGERY     "metal plate in leg"   RIGHT HEART CATH AND CORONARY/GRAFT ANGIOGRAPHY N/A 07/21/2018   Procedure: RIGHT HEART CATH AND CORONARY/GRAFT ANGIOGRAPHY;  Surgeon: Elder Negus, MD;  Location: [facility];  Service: Cardiovascular;  Laterality: N/A;   TEE WITHOUT CARDIOVERSION N/A 07/27/2015   Procedure: TRANSESOPHAGEAL ECHOCARDIOGRAM (TEE);  Surgeon: Thurmon Fair, MD;  Location: [facility] ENDOSCOPY;  Service: Cardiovascular;  Laterality: N/A;   TEE WITHOUT CARDIOVERSION N/A 07/21/2018   Procedure: TRANSESOPHAGEAL ECHOCARDIOGRAM (TEE);  Surgeon: Elder Negus, MD;  Location: [facility] ENDOSCOPY;  Service: Cardiovascular;  Laterality: N/A;   TONSILLECTOMY AND ADENOIDECTOMY      Allergies  Allergies  Allergen Reactions   Duloxetine Hcl Other (See Comments)    Behavioral changes, sleep disturbances   Iodinated  Contrast Media Other (See Comments)    Patient is unsure of reaction type   Penicillins Other (See Comments)    Immune to drug , does not work per patient    Doxycycline Nausea And Vomiting     Labs/Other Studies Reviewed    The following studies were reviewed today:  Cardiac  Studies & Procedures   ______________________________________________________________________________________________ CARDIAC CATHETERIZATION  CARDIAC CATHETERIZATION 08/18/2018  Narrative RCA: Prox 60% stenosis, mid 30-40% stenoses. TIMI III flow in prox-mid RCA. 100% distal occlusion SVG-RCA: Patent. Ostial 40%. Distal RCA moderate diffuse disease.  Guide catheter angiography provided superior images. There is TIMI III flow in SVG which fills RCA all the way to the ostium. Even is she were to have FFR positive lesion in the graft, I do not think the benefits of a stent would outweigh the risk of losing the graft and the entire RCA territory circulation in a borderline lesion. Also, the prox RCA lesion is well bypassed by the SVG graft. Thus, I decided not to perform FFR/PCI to either of these lesions. Continue medical management.  Elder Negus, MD [facility] Pager: [phone] Office: [phone] If no answer Cell [phone]  Findings Coronary Findings Diagnostic  Dominance: Right  Right Coronary Artery Prox RCA lesion is 60% stenosed. Prowater was only used for guid catheter engagement. No intervention was performed. Mid RCA lesion is 40% stenosed. Dist RCA lesion is 40% stenosed.  Right Posterior Atrioventricular Artery RPAV lesion is 50% stenosed. RPAV lesion is 40% stenosed.  Saphenous Graft To Dist RCA SVG and is normal in caliber. The graft exhibits no disease. Moderate diffuse disease in small native RCA distal to the graft Origin to Prox Graft lesion is 40% stenosed.  Intervention  No interventions have been documented.   CARDIAC CATHETERIZATION  CARDIAC CATHETERIZATION 07/21/2018  Narrative LM: Normal LAD: 100% mid occlusion. Mild distal disease LIMA-LAD: Patent LCx: Normal RCA: Prox 70%-80% stenosis, mid 30-40% stenoses. TIMI III flow in prox-mid RCA. 100% distal occlusion SVG-RCA: Patent. Ostial 75% stenosis. Distal RCA moderate  diffuse disease.  Patent grafts. Moderate to severe native RCA disease.  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  Lesions in SVG ostium and prox RCA were better appreciated on further review after the case. I discussed the findings with the patient. Given her elevated blood pressures and filling pressures, recommend aggressive medical management at this time. Added spironolactone. Will bring her back for elective PCI to prox RCA +/- SVG-RCA ostium through right radial access on June 9. Will check BMP, COVID test on 06/05  Resume warfarin today and hold June 7 and June 8.  Elder Negus, MD [facility] Pager: [phone] Office: [phone] If no answer Cell [phone]  Findings Coronary Findings Diagnostic  Dominance: Right  Left Main Vessel is normal in caliber. Vessel is angiographically normal.  Left Anterior Descending Mid LAD lesion is 100% stenosed. Mid LAD to Dist LAD lesion is 20% stenosed.  Left Circumflex Vessel is normal in caliber. Vessel is angiographically normal.  Right Coronary Artery Prox RCA lesion is 70% stenosed. Mid RCA lesion is 40% stenosed. Dist RCA lesion is 40% stenosed.  Right Posterior Atrioventricular Artery Post Atrio lesion is 40% stenosed. Post Atrio lesion is 40% stenosed.  LIMA Graft To Mid LAD graft was visualized by angiography and is normal in caliber. The graft exhibits no disease. Mild diffuse disease in LAD distal to the graft.  Saphenous Graft To  Dist RCA SVG and is normal in caliber. The graft exhibits no disease. Moderate diffuse disease in small native RCA distal to the graft Origin to Prox Graft lesion is 80% stenosed.  Intervention  No interventions have been documented.     ECHOCARDIOGRAM  ECHOCARDIOGRAM COMPLETE 02/03/2023  Narrative ECHOCARDIOGRAM REPORT    Patient Name:   KIMEKA BADOUR Date of Exam: 02/03/2023 Medical Rec #:  [id number]          Height:       65.0 in Accession #:    [id number]         Weight:       208.6 lb Date of Birth:  Aug 21, 1960           BSA:          2.014 m Patient Age:    62 years           BP:           115/68 mmHg Patient Gender: F                  HR:           59 bpm. Exam Location:  Inpatient  Procedure: 2D Echo, Cardiac Doppler and Color Doppler  Indications:    Ventricaular Tachycardia I47.2  History:        Patient has prior history of Echocardiogram examinations, most recent 04/08/2022. CAD, Pulmonary HTN, COPD and CKS, stage 3, Arrythmias:Atrial Fibrillation, PVC and Tachycardia; Risk Factors:Hypertension, Sleep Apnea and Diabetes. Mechanical trileaflet Aortic valve and Mechanical Mitral valve 01/01/2017.  Sonographer:    Lucendia Herrlich RCS Referring Phys: [id number] CALLIE E GOODRICH  IMPRESSIONS   1. Left ventricular ejection fraction, by estimation, is 55 to 60%. The left ventricle has normal function. The left ventricle has no regional wall motion abnormalities. Left ventricular diastolic parameters are indeterminate. 2. Right ventricular systolic function is mildly reduced. The right ventricular size is normal. 3. Left atrial size was mildly dilated. 4. The mitral valve has been repaired/replaced. No evidence of mitral valve regurgitation. No evidence of mitral stenosis. The mean mitral valve gradient is 3.0 mmHg. Echo findings are consistent with normal structure and function of the mitral valve prosthesis. 5. The aortic valve has been repaired/replaced. There is moderate calcification of the aortic valve. Aortic valve regurgitation is not visualized. Mild aortic valve stenosis. Aortic valve area, by VTI measures 1.34 cm. Aortic valve mean gradient measures 19.5 mmHg. Aortic valve Vmax measures 3.00 m/s. 6. The inferior vena cava is normal in size with greater than 50% respiratory variability, suggesting right atrial  pressure of 3 mmHg.  Conclusion(s)/Recommendation(s): Gradient through mechanical aortic valve mildly elevated but no change since echo 2/24.  FINDINGS Left Ventricle: Left ventricular ejection fraction, by estimation, is 55 to 60%. The left ventricle has normal function. The left ventricle has no regional wall motion abnormalities. The left ventricular internal cavity size was normal in size. There is no left ventricular hypertrophy. Left ventricular diastolic parameters are indeterminate.  Right Ventricle: The right ventricular size is normal. No increase in right ventricular wall thickness. Right ventricular systolic function is mildly reduced.  Left Atrium: Left atrial size was mildly dilated.  Right Atrium: Right atrial size was normal in size.  Pericardium: There is no evidence of pericardial effusion.  Mitral Valve: The mitral valve has been repaired/replaced. No evidence of mitral valve regurgitation. There is a mechanical valve present in the mitral position. Echo findings are consistent with normal  structure and function of the mitral valve prosthesis. No evidence of mitral valve stenosis. MV peak gradient, 11.1 mmHg. The mean mitral valve gradient is 3.0 mmHg.  Tricuspid Valve: The tricuspid valve is normal in structure. Tricuspid valve regurgitation is trivial. No evidence of tricuspid stenosis.  Aortic Valve: The aortic valve has been repaired/replaced. There is moderate calcification of the aortic valve. Aortic valve regurgitation is not visualized. Mild aortic stenosis is present. Aortic valve mean gradient measures 19.5 mmHg. Aortic valve peak gradient measures 36.1 mmHg. Aortic valve area, by VTI measures 1.34 cm. There is a mechanical valve present in the aortic position.  Pulmonic Valve: The pulmonic valve was grossly normal. Pulmonic valve regurgitation is not visualized. No evidence of pulmonic stenosis.  Aorta: The aortic root is normal in size and  structure.  Venous: The inferior vena cava is normal in size with greater than 50% respiratory variability, suggesting right atrial pressure of 3 mmHg.  IAS/Shunts: No atrial level shunt detected by color flow Doppler.   LEFT VENTRICLE PLAX 2D LVIDd:         4.80 cm LVIDs:         3.50 cm LV PW:         1.30 cm LV IVS:        0.90 cm LVOT diam:     2.10 cm LV SV:         88 LV SV Index:   44 LVOT Area:     3.46 cm   LEFT ATRIUM             Index        RIGHT ATRIUM           Index LA diam:        3.50 cm 1.74 cm/m   RA Area:     11.70 cm LA Vol (A2C):   71.3 ml 35.40 ml/m  RA Volume:   27.00 ml  13.41 ml/m LA Vol (A4C):   69.8 ml 34.66 ml/m LA Biplane Vol: 71.2 ml 35.36 ml/m AORTIC VALVE AV Area (Vmax):    1.26 cm AV Area (Vmean):   1.25 cm AV Area (VTI):     1.34 cm AV Vmax:           300.50 cm/s AV Vmean:          202.000 cm/s AV VTI:            0.660 m AV Peak Grad:      36.1 mmHg AV Mean Grad:      19.5 mmHg LVOT Vmax:         109.00 cm/s LVOT Vmean:        73.033 cm/s LVOT VTI:          0.255 m LVOT/AV VTI ratio: 0.39  AORTA Ao Root diam: 3.30 cm Ao Asc diam:  3.60 cm  MITRAL VALVE MV Area (PHT): 2.40 cm     SHUNTS MV Area VTI:   1.96 cm     Systemic VTI:  0.25 m MV Peak grad:  11.1 mmHg    Systemic Diam: 2.10 cm MV Mean grad:  3.0 mmHg MV Vmax:       1.67 m/s MV Vmean:      76.4 cm/s MV Decel Time: 316 msec MV E velocity: 122.50 cm/s MV A velocity: 1.22 cm/s MV E/A ratio:  100.41  Arvilla Meres MD Electronically signed by Arvilla Meres MD Signature Date/Time: 02/05/2023/11:53:45 AM    Final  TEE  ECHO TEE 07/21/2018  Narrative TRANSESOPHOGEAL ECHO REPORT    Patient Name:   ASMARA BACKS Date of Exam: 07/21/2018 Medical Rec #:  [id number]     Height:       64.0 in Accession #:    [id number]    Weight:       230.0 lb Date of Birth:  January 31, 1961      BSA:          2.08 m Patient Age:    57 years      BP:           120/45  mmHg Patient Gender: F             HR:           66 bpm. Exam Location:  Outpatient   Procedure: Transesophageal Echo, Cardiac Doppler and Color Doppler  Indications:     mitral valve disorder  History:         Patient has prior history of Echocardiogram examinations, most recent 07/27/2015. Aortic Valve: A 19mm St. Jude mechanical aortic valve prosthesis valve is present in the aortic position. Procedure Date: 2018 Mitral Valve: A 25 mm Regent mechanical valve is present in the mitral position. Procedure Date: 2018.  Sonographer:     Delcie Roch RDCS Referring Phys:  [id number] [name] Diagnosing Phys: Truett Mainland MD    PROCEDURE: After discussion of the risks and benefits of a TEE, an informed consent was obtained from the patient. Patients was monitored while under deep sedation. The transesophogeal probe was passed through the esophogus of the patient. Image quality was good. The patient developed no complications during the procedure.  IMPRESSIONS   1. The left ventricle has normal systolic function, with an ejection fraction of 55-60%. There is abnormal septal motion consistent with post-operative status. 2. The right ventricle has normal systolc function. 3. A 25 mm Regent mechanical valve is present in the mitral position. Procedure Date: 2018 Echo findings are consistent with is functioning normally the mitral prosthesis. Normal mitral valve prosthesis. Mean PG 6 mmHg, MVA 1.7 cm2. No thrombus seen. (Patient known to have subtherapeutic INR). 4. A 19mm St. Jude mechanical prosthesis valve is present in the aortic position. Procedure Date: 2018 Normal aortic valve prosthesis. Echo findings shows no evidence of Accelration time of 88 msec suggests normal functioning valve. Mean PG 25 mmHg likely due to small annular diameter. No prosthetic valve stenosis noted. of the aortic prosthesis. No thrombus seen. (Patient known to have subtherapeutic  INR).  FINDINGS Left Ventricle: The left ventricle has normal systolic function, with an ejection fraction of 55-60%. There is abnormal (paradoxical) septal motion consistent with post-operative status.  Right Ventricle: The right ventricle has normal systolic function.  Left Atrium: Left atrial size was normal in size.   Right Atrium: Right atrial size was normal in size. Right atrial pressure is estimated at 0 mmHg.  Pericardium: There is no evidence of pericardial effusion.  Mitral Valve: A 25 mm Regent mechanical valve is present in the mitral position. Procedure Date: 2018 Echo findings are consistent with normal function of the mitral prosthesis. Normal mitral valve prosthesis. Mean PG 6 mmHg, MVA 1.7 cm2. No thrombus seen. (Patient known to have subtherapeutic INR).  Tricuspid Valve: Tricuspid valve regurgitation is mild by color flow Doppler.  Aortic Valve: A 19mm St. Jude mechanical aortic valve prosthesis valve is present in the aortic position. Procedure Date: 2018 Normal aortic valve prosthesis. Echo findings shows  no evidence of Accelration time of 88 msec suggests normal functioning valve. Mean PG 25 mmHg likely due to small annular diameter. No prosthetic valve stenosis noted. of the aortic prosthesis. No thrombus seen. (Patient known to have subtherapeutic INR).  Pulmonic Valve: The pulmonic valve was grossly normal. Pulmonic valve regurgitation is not visualized by color flow Doppler.   +-------------+------------++ AORTIC VALVE              +-------------+------------++ AV Vmax:     332.00 cm/s  +-------------+------------++ AV Vmean:    232.000 cm/s +-------------+------------++ AV VTI:      0.790 m      +-------------+------------++ AV Peak Grad:44.1 mmHg    +-------------+------------++ AV Mean Grad:25.0 mmHg    +-------------+------------++  +--------------+-----------++ MITRAL VALVE               +--------------+-----------++ MV Area (PHT):1.77 cm    +--------------+-----------++ MV Peak grad: 17.1 mmHg   +--------------+-----------++ MV Mean grad: 5.5 mmHg    +--------------+-----------++ MV Vmax:      2.07 m/s    +--------------+-----------++ MV Vmean:     100.9 cm/s  +--------------+-----------++ MV VTI:       0.60 m      +--------------+-----------++ MV PHT:       124.00 msec +--------------+-----------++   Truett Mainland MD Electronically signed by Truett Mainland MD Signature Date/Time: 07/21/2018/3:09:51 PM    Final  MONITORS  LONG TERM MONITOR (3-14 DAYS) 01/10/2023  Narrative Patch Wear Time:  3 days and 2 hours (2024-11-15T12:42:14-0500 to 2024-11-18T15:17:38-0500)  Patient had a min HR of 48 bpm, max HR of 130 bpm, and avg HR of 62 bpm. Predominant underlying rhythm was Sinus Rhythm. Isolated SVEs were rare (<1.0%), SVE Couplets were rare (<1.0%), and no SVE Triplets were present. Isolated VEs were rare (<1.0%), and no VE Couplets or VE Triplets were present.  No symptoms reported  Conclusion: Normal/unremarkable study.     CARDIAC MRI  MR CARDIAC MORPHOLOGY W WO CONTRAST 02/05/2023  Narrative CLINICAL DATA:  30F with AF, CAD s/p CABG, rheumatic heart disease s/p mechanical MVR/AVR, CKD p/w WCT. Echo with EF 55-60%, mild RV dysfunction. Unable to induce VT on EPS.  EXAM: CARDIAC MRI  TECHNIQUE: The patient was scanned on a 1.5 Tesla Siemens magnet. A dedicated cardiac coil was used. Functional imaging was done using Fiesta sequences. 2,3, and 4 chamber views were done to assess for RWMA's. Modified Simpson's rule using a short axis stack was used to calculate an ejection fraction on a dedicated work [employer]. The patient received 10 cc of Gadavist. After 10 minutes inversion recovery sequences were used to assess for infiltration and scar tissue. Phase contrast velocity mapping  was performed above the aortic and pulmonic valves  CONTRAST:  10 cc  of Gadavist  FINDINGS: Left ventricle:  -Mild hypertrophy  -Normal size  -Mild systolic dysfunction  -Normal ECV (23%)  -Normal T2 values  -RV insertion site LGE  LV EF:  40% (Normal 52-79%)  Absolute volumes:  LV EDV: 98mL (Normal 78-167 mL)  LV ESV: 58mL (Normal 21-64 mL)  LV SV: 39mL (Normal 52-114 mL)  CO: 4.1L/min (Normal 2.7-6.3 L/min)  Indexed volumes:  LV EDV: 15mL/sq-m (Normal 50-96 mL/sq-m)  LV ESV: 9mL/sq-m (Normal 10-40 mL/sq-m)  LV SV: 82mL/sq-m (Normal 33-64 mL/sq-m)  CI: 2.0L/min/sq-m (Normal 1.9-3.9 L/min/sq-m)  Right ventricle:  -Normal size  -Moderate systolic dysfunction.  Regional RV dyskinesis near apex  RV EF: 39% (Normal 52-80%)  Absolute volumes:  RV EDV: (Normal 79-175 mL)  RV ESV: 75mL (Normal 13-75 mL)  RV SV: 48mL (Normal 56-110 mL)  CO: 5.0L/min (Normal 2.7-6 L/min)  Indexed volumes:  RV EDV: 51mL/sq-m (Normal 51-97 mL/sq-m)  RV ESV: 39mL/sq-m (Normal 9-42 mL/sq-m)  RV SV: 66mL/sq-m (Normal 35-61 mL/sq-m)  CI: 2.4 L/min/sq-m (Normal 1.8-3.8 L/min/sq-m)  Left atrium: Mild enlargement  Right atrium: Normal size  Mitral valve: S/p mechanical MVR.  Mild regurgitation  Aortic valve: S/p mechanical AVR. Trivial regurgitation  Tricuspid valve: Mild regurgitation  Pulmonic valve: Trivial regurgitation  Aorta: Normal proximal ascending aorta  Pulmonary artery: Dilated main pulmonary artery measuring 33mm  Pericardium: Normal  IMPRESSION: 1. Normal RV size with moderate systolic dysfunction (EF 39%). There is regional RV dyskinesis near apex. Given regional RV dyskinesis and RVEF <40%, would meet 1 major imaging criteria for ARVC (though no RV dilatation)  2.  Normal LV size with mild systolic dysfunction (EF 40%)  3. RV insertion site LGE, which is a nonspecific scar finding often seen in setting of elevated pulmonary  pressures  4.  Dilated main pulmonary artery measuring 33mm   Electronically Signed By: Epifanio Lesches M.D. On: 02/05/2023 16:29   ______________________________________________________________________________________________     Recent Labs: 02/03/2023: B Natriuretic Peptide 68.1 02/11/2023: Magnesium 2.4 02/26/2023: ALT 32; TSH 4.000 03/21/2023: Platelets 282 03/26/2023: Hemoglobin 11.6 03/27/2023: BUN 25; Creatinine, Ser 1.48; Potassium 4.0; Sodium 141  Recent Lipid Panel    Component Value Date/Time   CHOL 144 10/01/2022 1151   TRIG 170.0 (H) 10/01/2022 1151   HDL 50.60 10/01/2022 1151   CHOLHDL 3 10/01/2022 1151   VLDL 34.0 10/01/2022 1151   LDLCALC 59 10/01/2022 1151    History of Present Illness    63 year old female with the above past medical history including CAD s/p CABG x 2 (LIMA-LAD, SVG-PDA) in 2018, persistent atrial fibrillation, VT s/p ICD, 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  Cardiac catheterization in 2020 showed patent grafts.  Most recent echocardiogram in 01/2023 showed EF 55 to 60%, normal LV function, no RWMA, indeterminate diastolic parameters, mildly reduced RV systolic function, mildly dilated left atrium, normally functioning mitral valve prosthesis, normally functioning aortic valve replacement with mild aortic valve stenosis, mean gradient 19.5 mmHg.  Additionally, she has a history of persistent atrial fibrillation s/p A-fib ablation with PVI in 08/2022 by Dr. Elberta Fortis.  She had recurrence of atrial fibrillation post ablation and was restarted on amiodarone.  She was seen at [facility], ED in 01/2023 in the setting of wide-complex tachycardia, requiring DCCV.  She had recurrence of VT. EP study in December 2024 was without inducible VT, she was not  considered a VT ablation candidate due to mechanical valves.  Amiodarone was restarted.  Cardiac MRI in 01/2023 showed EF 40%, dilated main pulmonary artery.  She underwent ICD implant in 01/2023.  She underwent repeat DCCV for recurrent atrial fibrillation in 03/2023 with restoration of sinus rhythm.  Unfortunately, she had early recurrence of atrial fibrillation.  She was last seen in the office by EP on 04/03/2023 and continued to note episodes of atrial fibrillation with associated shortness of breath, fatigue.  Repeat ablation was recommended.  She presents today for follow-up.  Since her last visit she has been stable from a cardiac standpoint.  She denies symptoms concerning for angina.  She denies any  palpitations, dizziness, dyspnea, edema, PND, orthopnea, weight gain.  BP is stable. Overall, she reports doing well.  Home Medications    Current Outpatient Medications  Medication Sig Dispense Refill   Accu-Chek Softclix Lancets lancets Use as instructed (Patient taking differently: 1 each by Other route See admin instructions. Use as instructed) 100 each 12   acetaminophen (TYLENOL) 325 MG tablet Take 2 tablets (650 mg total) by mouth every 4 (four) hours as needed for headache or mild pain (pain score 1-3).     albuterol (VENTOLIN HFA) 108 (90 Base) MCG/ACT inhaler Inhale 1-2 puffs into the lungs every 6 (six) hours as needed for wheezing or shortness of breath.     amiodarone (PACERONE) 200 MG tablet Take 1 tablet (200 mg) twice daily for 2 weeks, then decrease to 200 mg daily 90 tablet 1   aspirin EC 81 MG tablet Take 81 mg by mouth daily.      Blood Glucose Monitoring Suppl DEVI 1 each by Does not apply route in the morning, at noon, and at bedtime. May substitute to any manufacturer covered by patient's insurance. 1 each 0   clonazePAM (KLONOPIN) 0.5 MG tablet Take 1.5 tablets (0.75 mg total) by mouth at bedtime. 45 tablet 0   furosemide (LASIX) 20 MG tablet Take 20 mg by mouth 2 (two)  times daily.     gabapentin (NEURONTIN) 100 MG capsule Take 1 capsule (100 mg total) by mouth at bedtime.     Glucose Blood (BLOOD GLUCOSE TEST STRIPS) STRP 1 each by In Vitro route in the morning, at noon, and at bedtime. May substitute to any manufacturer covered by patient's insurance. 100 strip 11   guaiFENesin-codeine 100-10 MG/5ML syrup Take 5 mLs by mouth every 6 (six) hours as needed for cough. 120 mL 0   isosorbide mononitrate (IMDUR) 60 MG 24 hr tablet Take 1 tablet (60 mg total) by mouth daily. 90 tablet 3   Lancet Device MISC 1 each by Does not apply route in the morning, at noon, and at bedtime. May substitute to any manufacturer covered by patient's insurance. 1 each 0   Lancets Misc. MISC 1 each by Does not apply route in the morning, at noon, and at bedtime. May substitute to any manufacturer covered by patient's insurance. 100 each 0   levalbuterol (XOPENEX) 0.63 MG/3ML nebulizer solution Take 0.63 mg by nebulization every 4 (four) hours as needed for wheezing or shortness of breath.     magnesium oxide (MAG-OX) 400 (240 Mg) MG tablet Take 1 tablet (400 mg total) by mouth daily. 30 tablet 6   metFORMIN (GLUCOPHAGE) 500 MG tablet Take 1 tablet (500 mg total) by mouth 2 (two) times daily with a meal. 180 tablet 3   metoprolol succinate (TOPROL-XL) 100 MG 24 hr tablet Take 1 tablet (100 mg total) by mouth 2 (two) times daily. Take with or immediately following a meal. 60 tablet 6   montelukast (SINGULAIR) 10 MG tablet Take 1 tablet (10 mg total) by mouth daily. 30 tablet 11   oxyCODONE-acetaminophen (PERCOCET) 5-325 MG tablet Take 1 tablet by mouth every 12 (twelve) hours as needed for severe pain (pain score 7-10). 60 tablet 0   pantoprazole (PROTONIX) 40 MG tablet Take 40 mg by mouth 2 (two) times daily.     potassium chloride SA (KLOR-CON M) 20 MEQ tablet Take 1 tablet (20 mEq total) by mouth daily. Take 2 tablets once at dinner 03/26/23 and then 1 tablet daily starting 03/27/23 30 tablet  0   rosuvastatin (CRESTOR) 40 MG tablet TAKE 1 TABLET(40 MG) BY MOUTH DAILY 90 tablet 1   sacubitril-valsartan (ENTRESTO) 24-26 MG Take 1 tablet by mouth 2 (two) times daily. 60 tablet 3   Tiotropium Bromide-Olodaterol (STIOLTO RESPIMAT) 2.5-2.5 MCG/ACT AERS Inhale 2 puffs into the lungs daily. 4 g 6   warfarin (COUMADIN) 5 MG tablet Take 1 tablet (5 mg total) by mouth 4 (four) times a week. Sat, Tues, Thurs, and Sun     warfarin (COUMADIN) 7.5 MG tablet Take 7.5 mg by mouth daily.     No current facility-administered medications for this visit.     Review of Systems    She denies chest pain, palpitations, dyspnea, pnd, orthopnea, n, v, dizziness, syncope, edema, weight gain, or early satiety. All other systems reviewed and are otherwise negative except as noted above.    Physical Exam    VS:  BP (!) 102/58   Pulse 84   Ht 5\' 5"  (1.651 m)   Wt 201 lb (91.2 kg)   LMP 06/20/2015   SpO2 97%   BMI 33.45 kg/m  , BMI Body mass index is 33.45 kg/m. STOP-Bang Score:  2      GEN: Well nourished, well developed, in no acute distress. HEENT: normal. Neck: Supple, no JVD, carotid bruits, or masses. Cardiac: RRR, no murmurs, rubs, or gallops. No clubbing, cyanosis, edema.  Radials/DP/PT 2+ and equal bilaterally.  Respiratory:  Respirations regular and unlabored, clear to auscultation bilaterally. GI: Soft, nontender, nondistended, BS + x 4. MS: no deformity or atrophy. Skin: warm and dry, no rash. Neuro:  Strength and sensation are intact. Psych: Normal affect.  Accessory Clinical Findings    ECG personally reviewed by me today - EKG Interpretation Date/Time:  Friday April 11 2023 09:56:03 EST Ventricular Rate:  84 PR Interval:  292 QRS Duration:  126 QT Interval:  422 QTC Calculation: 498 R Axis:   -61  Text Interpretation: Atrial-paced rhythm with prolonged AV conduction Left axis deviation Non-specific intra-ventricular conduction block Minimal voltage criteria for LVH, may  be normal variant ( Cornell product ) Cannot rule out Septal infarct , age undetermined When compared with ECG of 03-Apr-2023 14:10, No significant change was found Confirmed by Bernadene Person ([id number]) on 04/11/2023 10:05:46 AM  - no acute changes.   Lab Results  Component Value Date   WBC 8.8 03/21/2023   HGB 11.6 (L) 03/26/2023   HCT 34.0 (L) 03/26/2023   MCV 87 03/21/2023   PLT 282 03/21/2023   Lab Results  Component Value Date   CREATININE 1.48 (H) 03/27/2023   BUN 25 (H) 03/27/2023   NA 141 03/27/2023   K 4.0 03/27/2023   CL 104 03/27/2023   CO2 23 03/27/2023   Lab Results  Component Value Date   ALT 32 02/26/2023   AST 29 02/26/2023   ALKPHOS 115 02/26/2023   BILITOT 0.5 02/26/2023   Lab Results  Component Value Date   CHOL 144 10/01/2022   HDL 50.60 10/01/2022   LDLCALC 59 10/01/2022   TRIG 170.0 (H) 10/01/2022   CHOLHDL 3 10/01/2022    Lab Results  Component Value Date   HGBA1C 6.1 (H) 12/23/2022    Assessment & Plan    1. CAD: S/p CABG x 2 (LIMA-LAD, SVG-PDA) in 2018. Stable with no anginal symptoms. No indication for ischemic evaluation.  Continue aspirin, metoprolol, Entresto, Imdur, Crestor.  2. ICM: Most recent echocardiogram in 01/2023 showed EF 55 to 60%, normal LV function,  no RWMA, indeterminate diastolic parameters, mildly reduced RV systolic function, mildly dilated left atrium, normally functioning mitral valve prosthesis, normally functioning aortic valve replacement with mild aortic valve stenosis, mean gradient 19.5 mmHg.  Cardiac MRI in 01/2023 showed EF 40%, dilated main pulmonary artery. Euvolemic and well compensated on exam. Continue metoprolol, Entresto, Lasix.  3. Persistent atrial fibrillation: S/p ablation in 08/2022.  She underwent repeat DCCV for recurrent atrial fibrillation in 03/2023 with restoration of sinus rhythm.  Unfortunately, she had early recurrence of atrial fibrillation. Recenlty re-started on amiodarone. Pending possible repeat  ablation. Following with EP. Continue amiodarone, metoprolol, warfarin.   4. History of VT: S/p ICD in 2024.  Most recent in-clinic device check on 04/03/2023 showed normal device function. Following with EP.   5. 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  Most recent echo in 01/2023 stable as above.  Continue SBE prophylaxis, Lasix, warfarin.   6. Hypertension: BP well controlled. Continue current antihypertensive regimen.   7. Hyperlipidemia: LDL was 59 in 09/2022.  Continue Crestor.  8. CKD stage III: Creatinine was stable at 1.48 in 03/2023.  9. Type 2 diabetes: A1c was 6.1 in 12/2022.  Monitored and managed per PCP.  10. OSA: Reports intermittent adherence to CPAP. Encouraged adherence.   11. Disposition:  Follow-up as scheduled with Dr. Elberta Fortis in 04/2023.  Follow-up in 6 months with Dr. Servando Salina.       Joylene Grapes, NP 04/11/2023, 10:31 AM

## 2023-04-13 ENCOUNTER — Encounter: Payer: Self-pay | Admitting: Family Medicine

## 2023-04-13 ENCOUNTER — Encounter: Payer: Self-pay | Admitting: Nurse Practitioner

## 2023-04-14 ENCOUNTER — Encounter: Payer: Self-pay | Admitting: Psychiatry

## 2023-04-14 ENCOUNTER — Telehealth: Payer: Self-pay

## 2023-04-14 ENCOUNTER — Ambulatory Visit (INDEPENDENT_AMBULATORY_CARE_PROVIDER_SITE_OTHER): Payer: 59 | Admitting: Psychiatry

## 2023-04-14 VITALS — BP 134/80 | HR 97 | Temp 97.7°F | Ht 65.0 in | Wt 203.8 lb

## 2023-04-14 DIAGNOSIS — F4310 Post-traumatic stress disorder, unspecified: Secondary | ICD-10-CM | POA: Diagnosis not present

## 2023-04-14 DIAGNOSIS — G4700 Insomnia, unspecified: Secondary | ICD-10-CM | POA: Diagnosis not present

## 2023-04-14 DIAGNOSIS — F331 Major depressive disorder, recurrent, moderate: Secondary | ICD-10-CM

## 2023-04-14 DIAGNOSIS — F411 Generalized anxiety disorder: Secondary | ICD-10-CM

## 2023-04-14 MED ORDER — SERTRALINE HCL 50 MG PO TABS: ORAL_TABLET | ORAL | 1 refills | Status: DC

## 2023-04-14 MED ORDER — RAMELTEON 8 MG PO TABS
8.0000 mg | ORAL_TABLET | Freq: Every day | ORAL | 1 refills | Status: DC
Start: 2023-04-14 — End: 2023-05-09

## 2023-04-14 MED ORDER — CLONAZEPAM 0.5 MG PO TABS: 0.5000 mg | ORAL_TABLET | Freq: Every evening | ORAL | 1 refills | Status: DC | PRN

## 2023-04-14 MED ORDER — PANTOPRAZOLE SODIUM 40 MG PO TBEC
40.0000 mg | DELAYED_RELEASE_TABLET | Freq: Two times a day (BID) | ORAL | 0 refills | Status: AC
Start: 2023-04-14 — End: ?

## 2023-04-14 NOTE — Patient Instructions (Signed)
 Start sertraline 25 mg at night for one week, then 50 mg at night  Decrease clonazepam 0.5 mg at night  Start ramelteon 8 mg at night as needed for insomnia  Referral to therapy  Next appointment- 4/21 at 11 am

## 2023-04-14 NOTE — Telephone Encounter (Signed)
 Copied from CRM [id number]. Topic: Clinical - Prescription Issue >> Apr 14, 2023 12:20 PM Prudencio Pair wrote: Reason for CRM: Patient called stating that she sent a message to Dr. Jairo Ben in regards to refilling her pantoprazole 40 mg but she has not heard back from anyone. Checked chart & looks as if CMA has routed it to Dr. Jairo Ben. Can nurse give a call back to patient to advise if medication has been sent for a refill? CB #: [phone].    Called pt to informed her that the Rx has already been sent to [facility] in [place]

## 2023-04-15 ENCOUNTER — Ambulatory Visit: Payer: 59 | Attending: Cardiology

## 2023-04-15 DIAGNOSIS — I480 Paroxysmal atrial fibrillation: Secondary | ICD-10-CM | POA: Diagnosis not present

## 2023-04-15 DIAGNOSIS — Z5181 Encounter for therapeutic drug level monitoring: Secondary | ICD-10-CM

## 2023-04-15 LAB — POCT INR: INR: 2.1 (ref 2.0–3.0)

## 2023-04-15 NOTE — Patient Instructions (Signed)
 Description   Take 7.5mg  today and then START taking 5mg  daily except 7.5mg  on Sundays.  Stay consistent with greens each week (1 per week)  Recheck INR in 1 week.  [facility] [phone]  *Amiodarone to 200mg  BID x 14 days, then 200mg  daily - will decreased to 200mg  daily on 04/07/23*

## 2023-04-16 ENCOUNTER — Ambulatory Visit (HOSPITAL_BASED_OUTPATIENT_CLINIC_OR_DEPARTMENT_OTHER): Payer: 59 | Admitting: Radiology

## 2023-04-16 ENCOUNTER — Encounter (HOSPITAL_BASED_OUTPATIENT_CLINIC_OR_DEPARTMENT_OTHER): Payer: Self-pay | Admitting: Emergency Medicine

## 2023-04-16 ENCOUNTER — Ambulatory Visit (HOSPITAL_BASED_OUTPATIENT_CLINIC_OR_DEPARTMENT_OTHER)
Admission: EM | Admit: 2023-04-16 | Discharge: 2023-04-16 | Disposition: A | Discharge status: Home / Self Care | Payer: 59 | Attending: Family Medicine | Admitting: Family Medicine

## 2023-04-16 ENCOUNTER — Telehealth: Payer: Self-pay | Admitting: Cardiology

## 2023-04-16 ENCOUNTER — Ambulatory Visit: Payer: Self-pay | Admitting: Family Medicine

## 2023-04-16 DIAGNOSIS — R059 Cough, unspecified: Secondary | ICD-10-CM

## 2023-04-16 DIAGNOSIS — R051 Acute cough: Secondary | ICD-10-CM | POA: Diagnosis not present

## 2023-04-16 DIAGNOSIS — R0602 Shortness of breath: Secondary | ICD-10-CM

## 2023-04-16 DIAGNOSIS — I509 Heart failure, unspecified: Secondary | ICD-10-CM

## 2023-04-16 DIAGNOSIS — J441 Chronic obstructive pulmonary disease with (acute) exacerbation: Secondary | ICD-10-CM

## 2023-04-16 DIAGNOSIS — Z95810 Presence of automatic (implantable) cardiac defibrillator: Secondary | ICD-10-CM | POA: Diagnosis not present

## 2023-04-16 MED ORDER — METHYLPREDNISOLONE ACETATE 40 MG/ML IJ SUSP
40.0000 mg | Freq: Once | INTRAMUSCULAR | Status: AC
  Administered 2023-04-16: 40 mg via INTRAMUSCULAR

## 2023-04-16 NOTE — Progress Notes (Signed)
 Chest X- Ray IMPRESSION:   1. No active cardiopulmonary disease.    2. Mild cardiomegaly.  Report is the same as I gave the patient during her visit.  She was advised to check the portal for results and that I would only call her, if the radiologist's report differed from ine.

## 2023-04-16 NOTE — Telephone Encounter (Addendum)
 Chief Complaint: tachycardia Symptoms: increased HR Frequency: today Pertinent Negatives: Patient denies fever, CP, dizziness Disposition: [] ED /[] Urgent Care (no appt availability in office) / [] Appointment(In office/virtual)/ []  [facility]/ [] Home Care/ [] Refused Recommended Disposition /[] [facility]/ [x]  Follow-up with PCP Additional Notes: Patient c/o tachycardia. Reports that she went to UC today for COPD exacerbation and was given prednisone injection. Upon leaving UC, she noticed tachycardia. Pt has hx of VT and pacemaker and concerned of pacemaker going off. Readings during triage were between 110-145. Triager was able to deescalate caller and instructed pt to distract herself to see if HR remains > 140. Pt is currently at home sitting down with boyfriend present. Triager instructed that once calm, to check HR, and if consistently > 140, to call 911. Last HR was 110. Of note, triager also recommended pt to call cardiologist for further recs. Patient verbalized understanding and to call back/911 with worsening symptoms. Pt wants to know how long Prednisone should last in her system/how long tachycardia will persist. Triager will forward to PCP to review and advise.    Copied from CRM [id number]. Topic: Clinical - Prescription Issue >> Apr 16, 2023  5:17 PM Priscille Loveless wrote: Reason for CRM:Pt stated that she went to the dr today and they gave her an rx for predisone and while she was there they gave her a shot of it. 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 She stated that she also has a difibulator and is very scared. Reason for Disposition  [1] Caller has URGENT question AND [2] triager unable to answer question  Answer Assessment - Initial Assessment Questions 1. DESCRIPTION: "Please describe your heart rate or heartbeat that you are having" (e.g., fast/slow, regular/irregular, skipped or extra beats,  "palpitations")     Heart is racing up to 145 bpm 2. ONSET: "When did it start?" (Minutes, hours or days)      10 mins after leaving UC 3. DURATION: "How long does it last" (e.g., seconds, minutes, hours)     Constant for minutes 4. PATTERN "Does it come and go, or has it been constant since it started?"  "Does it get worse with exertion?"   "Are you feeling it now?"     constant 5. TAP: "Using your hand, can you tap out what you are feeling on a chair or table in front of you, so that I can hear?" (Note: not all patients can do this)       N/a 6. HEART RATE: "Can you tell me your heart rate?" "How many beats in 15 seconds?"  (Note: not all patients can do this)       Reports has pulse ox reading 99% 110 HR 7. RECURRENT SYMPTOM: "Have you ever had this before?" If Yes, ask: "When was the last time?" and "What happened that time?"      no 8. CAUSE: "What do you think is causing the palpitations?"     Steroid injection from UC 9. CARDIAC HISTORY: "Do you have any history of heart disease?" (e.g., heart attack, angina, bypass surgery, angioplasty, arrhythmia)      VT - has pacemaker Reports not missing any cardiac meds 10. OTHER SYMPTOMS: "Do you have any other symptoms?" (e.g., dizziness, chest pain, sweating, difficulty breathing)       Low of 62 high 165 pacemaker settings - no hx Chest pressure - MD at UC thinks r/t lungs/COPD exacerbation  Protocols  used: Heart Rate and Heartbeat Questions-A-AH, ICD and Pacemaker Symptoms and Questions-A-AH

## 2023-04-16 NOTE — ED Provider Notes (Signed)
 [facility]    CSN: [id number] Arrival date & time: 04/16/23  1457      History   Chief Complaint No chief complaint on file.   HPI Kimberly Hobbs is a 63 y.o. female.   Patient is here on 03/17/2018 from and was diagnosed with a COPD exacerbation and put on prednisone, 40 mg daily for 5 days and doxycycline, 100 mg twice daily for 10 days.  She had a chest x-ray that was showing some changes but no clear pneumonia.  She returned on 03/21/2023 and was seen again about COPD exacerbation.  Her chest x-ray was read as possible community-acquired pneumonia during the visit but the follow-up report from radiology is actually that it showed improvement from 03/18/2023 film and did not show pneumonia.  She was taken off the doxycycline and put on Cefdinir and also given a Decadron shot.  She is here today with chest tightness and worries that she might have a CHF exacerbation.     Past Medical History:  Diagnosis Date   Allergy ?   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 01/01/2017 01/01/2017    Patient Active Problem List   Diagnosis Date Noted   Ventricular tachycardia (HCC) 02/03/2023   VT (ventricular tachycardia) (HCC) 02/03/2023   Psychophysiological insomnia 01/23/2023   Hypercoagulable state due to persistent atrial fibrillation (HCC) 10/03/2022   Restless leg 10/01/2022   OSA (obstructive sleep apnea) 09/27/2022   Vasomotor rhinitis  07/31/2022   Iron deficiency anemia 07/31/2022   DDD (degenerative disc disease), lumbosacral 03/26/2022   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   Chronic right-sided low back pain without sciatica 02/26/2022   Cervical strain 12/20/2021   Sensorineural hearing loss (SNHL) of both ears 10/24/2021   Hemorrhoids 05/18/2021   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   Paroxysmal atrial fibrillation (HCC) 03/28/2021   GAD (generalized anxiety disorder) 02/01/2021   DM (diabetes mellitus) (HCC) 02/01/2021   COPD mixed type (HCC) 07/07/2019   Pulmonary hypertension, unspecified (HCC) 07/21/2018   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/05/2018   Long term (current) use of anticoagulants 01/08/2017   H/O mitral valve replacement with mechanical #25 mechanical SJM 01/01/2017) 01/01/2017   PVCs (premature ventricular contractions) 11/17/2015   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    Essential hypertension 06/29/2015    Past Surgical History:  Procedure Laterality Date   ATRIAL FIBRILLATION ABLATION N/A 09/05/2022   Procedure: ATRIAL FIBRILLATION ABLATION;  Surgeon: Regan Lemming, MD;  Location: [facility];  Service: Cardiovascular;  Laterality: N/A;   CARDIAC CATHETERIZATION     CARDIAC VALVE REPLACEMENT     CARDIOVERSION N/A 04/02/2022   Procedure: CARDIOVERSION;  Surgeon: Elder Negus, MD;  Location: [facility];  Service: Cardiovascular;  Laterality: N/A;   CARDIOVERSION N/A 04/27/2022  Procedure: CARDIOVERSION;  Surgeon: Elder Negus, MD;  Location: [facility] ENDOSCOPY;  Service: Cardiovascular;  Laterality: N/A;   CARDIOVERSION N/A 03/27/2023   Procedure: CARDIOVERSION;  Surgeon: Maisie Fus, MD;  Location: [facility];  Service: Cardiovascular;  Laterality: N/A;   CHOLECYSTECTOMY     CORONARY ARTERY BYPASS GRAFT     CORONARY/GRAFT ANGIOGRAPHY N/A  08/18/2018   Procedure: CORONARY/GRAFT ANGIOGRAPHY;  Surgeon: Elder Negus, MD;  Location: [facility];  Service: Cardiovascular;  Laterality: N/A;   ELECTROPHYSIOLOGY STUDY N/A 02/04/2023   Procedure: ELECTROPHYSIOLOGY STUDY;  Surgeon: Regan Lemming, MD;  Location: [facility];  Service: Cardiovascular;  Laterality: N/A;   ICD IMPLANT N/A 02/10/2023   Procedure: ICD IMPLANT;  Surgeon: Regan Lemming, MD;  Location: [facility] INVASIVE CV LAB;  Service: Cardiovascular;  Laterality: N/A;   LEG SURGERY     "metal plate in leg"   RIGHT HEART CATH AND CORONARY/GRAFT ANGIOGRAPHY N/A 07/21/2018   Procedure: RIGHT HEART CATH AND CORONARY/GRAFT ANGIOGRAPHY;  Surgeon: Elder Negus, MD;  Location: [facility];  Service: Cardiovascular;  Laterality: N/A;   TEE WITHOUT CARDIOVERSION N/A 07/27/2015   Procedure: TRANSESOPHAGEAL ECHOCARDIOGRAM (TEE);  Surgeon: Thurmon Fair, MD;  Location: [facility] ENDOSCOPY;  Service: Cardiovascular;  Laterality: N/A;   TEE WITHOUT CARDIOVERSION N/A 07/21/2018   Procedure: TRANSESOPHAGEAL ECHOCARDIOGRAM (TEE);  Surgeon: Elder Negus, MD;  Location: [facility] ENDOSCOPY;  Service: Cardiovascular;  Laterality: N/A;   TONSILLECTOMY AND ADENOIDECTOMY      OB History   No obstetric history on file.      Home Medications    Prior to Admission medications   Medication Sig Start Date End Date Taking? Authorizing Provider  amiodarone (PACERONE) 200 MG tablet Take 1 tablet (200 mg) twice daily for 2 weeks, then decrease to 200 mg daily 04/03/23  Yes [name], NP  gabapentin (NEURONTIN) 100 MG capsule Take 1 capsule (100 mg total) by mouth at bedtime. 03/26/23  Yes Turner, Cornelious Bryant, MD  isosorbide mononitrate (IMDUR) 60 MG 24 hr tablet Take 1 tablet (60 mg total) by mouth daily. 09/13/22  Yes [name], [name], MD  metFORMIN (GLUCOPHAGE) 500 MG tablet Take 1 tablet (500 mg total) by mouth 2 (two) times daily with a meal. 12/23/22  Yes  Saralyn Pilar A, PA  metoprolol succinate (TOPROL-XL) 100 MG 24 hr tablet Take 1 tablet (100 mg total) by mouth 2 (two) times daily. Take with or immediately following a meal. 02/11/23  Yes Tillery, Mariam Dollar, PA-C  montelukast (SINGULAIR) 10 MG tablet Take 1 tablet (10 mg total) by mouth daily. 05/27/22 05/27/23 Yes Charlott Holler, MD  pantoprazole (PROTONIX) 40 MG tablet Take 1 tablet (40 mg total) by mouth 2 (two) times daily. 04/14/23  Yes Saralyn Pilar A, PA  potassium chloride SA (KLOR-CON M) 20 MEQ tablet Take 1 tablet (20 mEq total) by mouth daily. Take 2 tablets once at dinner 03/26/23 and then 1 tablet daily starting 03/27/23 03/27/23  Yes Turner, Traci R, MD  rosuvastatin (CRESTOR) 40 MG tablet TAKE 1 TABLET(40 MG) BY MOUTH DAILY 03/04/23  Yes [name], Manish J, MD  sacubitril-valsartan (ENTRESTO) 24-26 MG Take 1 tablet by mouth 2 (two) times daily. 02/11/23  Yes [name], PA-C  Accu-Chek Softclix Lancets lancets Use as instructed Patient taking differently: 1 each by Other route See admin instructions. Use as instructed 05/23/22   Nche, [name], NP  acetaminophen (TYLENOL) 325 MG tablet Take 2  tablets (650 mg total) by mouth every 4 (four) hours as needed for headache or mild pain (pain score 1-3). 02/11/23   [name], PA-C  albuterol (VENTOLIN HFA) 108 (90 Base) MCG/ACT inhaler Inhale 1-2 puffs into the lungs every 6 (six) hours as needed for wheezing or shortness of breath.    [provider]  aspirin EC 81 MG tablet Take 81 mg by mouth daily.     [provider]  Blood Glucose Monitoring Suppl DEVI 1 each by Does not apply route in the morning, at noon, and at bedtime. May substitute to any manufacturer covered by patient's insurance. 03/26/23   [name], PA  clonazePAM (KLONOPIN) 0.5 MG tablet Take 1.5 tablets (0.75 mg total) by mouth at bedtime. 01/23/23   [name], PA  clonazePAM (KLONOPIN) 0.5 MG tablet Take 1 tablet  (0.5 mg total) by mouth at bedtime as needed for anxiety. 04/16/23 06/15/23  Neysa Hotter, MD  furosemide (LASIX) 20 MG tablet Take 20 mg by mouth 2 (two) times daily.    [provider]  Glucose Blood (BLOOD GLUCOSE TEST STRIPS) STRP 1 each by In Vitro route in the morning, at noon, and at bedtime. May substitute to any manufacturer covered by patient's insurance. 03/26/23   Saralyn Pilar A, PA  guaiFENesin-codeine 100-10 MG/5ML syrup Take 5 mLs by mouth every 6 (six) hours as needed for cough. 03/21/23   [name], MD  Lancet Device MISC 1 each by Does not apply route in the morning, at noon, and at bedtime. May substitute to any manufacturer covered by patient's insurance. 03/26/23 04/25/23  [name], PA  Lancets Misc. MISC 1 each by Does not apply route in the morning, at noon, and at bedtime. May substitute to any manufacturer covered by patient's insurance. 03/26/23 04/25/23  Saralyn Pilar A, PA  magnesium oxide (MAG-OX) 400 (240 Mg) MG tablet Take 1 tablet (400 mg total) by mouth daily. 02/12/23   [name], PA-C  oxyCODONE-acetaminophen (PERCOCET) 5-325 MG tablet Take 1 tablet by mouth every 12 (twelve) hours as needed for severe pain (pain score 7-10). 03/20/23   [name], MD  ramelteon (ROZEREM) 8 MG tablet Take 1 tablet (8 mg total) by mouth at bedtime. 04/14/23 06/13/23  Neysa Hotter, MD  sertraline (ZOLOFT) 50 MG tablet 25 mg at night for one week, then 50 mg at night 04/14/23   Hisada, Barbee Cough, MD  Tiotropium Bromide-Olodaterol (STIOLTO RESPIMAT) 2.5-2.5 MCG/ACT AERS Inhale 2 puffs into the lungs daily. 09/03/22   Charlott Holler, MD  warfarin (COUMADIN) 5 MG tablet Take 1 tablet (5 mg total) by mouth 4 (four) times a week. Sat, Tues, Thurs, and Sun 03/27/23   [name], MD  warfarin (COUMADIN) 7.5 MG tablet Take 7.5 mg by mouth daily.    [provider]    Family History Family History  Problem Relation Age of Onset   Drug abuse Mother     Alcohol abuse Mother    Anxiety disorder Mother    Cancer Mother    Hypertension Mother    Diabetes Mother    Hyperlipidemia Mother    Heart disease Mother    Drug abuse Father    Alcohol abuse Father    Anxiety disorder Father    Cancer Father    Hypertension Father    Diabetes Father    Heart disease Father    Hypertension Sister    Hypertension Brother  Hypertension Brother    Schizophrenia Maternal Grandmother    Cancer Daughter    COPD Daughter    Breast cancer Neg Hx     Social History Social History   Tobacco Use   Smoking status: Former    Current packs/day: 0.00    Average packs/day: 1.6 packs/day for 50.0 years (80.0 ttl pk-yrs)    Types: Cigarettes    Start date: 12/23/1976    Quit date: 12/23/2016    Years since quitting: 6.3    Passive exposure: Past   Smokeless tobacco: Never   Tobacco comments:    Former smoker 01/24/23  Vaping Use   Vaping status: Never Used  Substance Use Topics   Alcohol use: Never   Drug use: Yes    Types: Hydrocodone     Allergies   Duloxetine hcl, Iodinated contrast media, Penicillins, and Doxycycline   Review of Systems Review of Systems  Constitutional:  Negative for chills and fever.  HENT:  Negative for ear pain and sore throat.   Eyes:  Negative for pain and visual disturbance.  Respiratory:  Positive for chest tightness. Negative for cough and shortness of breath.   Cardiovascular:  Negative for chest pain and palpitations.  Gastrointestinal:  Negative for abdominal pain, constipation, diarrhea, nausea and vomiting.  Genitourinary:  Negative for dysuria and hematuria.  Musculoskeletal:  Negative for arthralgias and back pain.  Skin:  Negative for color change and rash.  Neurological:  Negative for seizures and syncope.  All other systems reviewed and are negative.    Physical Exam Triage Vital Signs ED Triage Vitals  Encounter Vitals Group     BP 04/16/23 1509 123/80     Systolic BP Percentile --       Diastolic BP Percentile --      Pulse Rate 04/16/23 1509 85     Resp 04/16/23 1509 18     Temp --      Temp src --      SpO2 04/16/23 1509 96 %     Weight --      Height --      Head Circumference --      Peak Flow --      Pain Score 04/16/23 1508 3     Pain Loc --      Pain Education --      Exclude from Growth Chart --    No data found.  Updated Vital Signs BP 123/80 (BP Location: Right Arm)   Pulse 85   Resp 18   LMP 06/20/2015   SpO2 96%   Visual Acuity Right Eye Distance:   Left Eye Distance:   Bilateral Distance:    Right Eye Near:   Left Eye Near:    Bilateral Near:     Physical Exam Vitals and nursing note reviewed.  Constitutional:      General: She is not in acute distress.    Appearance: She is well-developed. She is not ill-appearing or toxic-appearing.  HENT:     Head: Normocephalic and atraumatic.     Right Ear: Hearing, tympanic membrane, ear canal and external ear normal.     Left Ear: Hearing, tympanic membrane, ear canal and external ear normal.     Nose: No congestion or rhinorrhea.     Right Sinus: No maxillary sinus tenderness or frontal sinus tenderness.     Left Sinus: No maxillary sinus tenderness or frontal sinus tenderness.     Mouth/Throat:     Lips: Pink.  Mouth: Mucous membranes are moist.     Pharynx: Uvula midline. No oropharyngeal exudate or posterior oropharyngeal erythema.     Tonsils: No tonsillar exudate.  Eyes:     Conjunctiva/sclera: Conjunctivae normal.     Pupils: Pupils are equal, round, and reactive to light.  Cardiovascular:     Rate and Rhythm: Normal rate and regular rhythm.     Heart sounds: S1 normal and S2 normal. No murmur heard. Pulmonary:     Effort: Pulmonary effort is normal. No respiratory distress.     Breath sounds: Examination of the right-upper field reveals decreased breath sounds and rhonchi. Examination of the left-upper field reveals decreased breath sounds and rhonchi. Decreased breath  sounds and rhonchi present. No wheezing or rales.  Abdominal:     General: Bowel sounds are normal.     Palpations: Abdomen is soft.     Tenderness: There is no abdominal tenderness.  Musculoskeletal:        General: No swelling.     Cervical back: Neck supple.  Lymphadenopathy:     Head:     Right side of head: No submental, submandibular, tonsillar, preauricular or posterior auricular adenopathy.     Left side of head: No submental, submandibular, tonsillar, preauricular or posterior auricular adenopathy.     Cervical: No cervical adenopathy.     Right cervical: No superficial cervical adenopathy.    Left cervical: No superficial cervical adenopathy.  Skin:    General: Skin is warm and dry.     Capillary Refill: Capillary refill takes less than 2 seconds.     Findings: No rash.  Neurological:     Mental Status: She is alert and oriented to person, place, and time.  Psychiatric:        Mood and Affect: Mood normal.      UC Treatments / Results  Labs (all labs ordered are listed, but only abnormal results are displayed) Labs Reviewed - No data to display  EKG   Radiology: EXAM:  CHEST - 2 VIEW   COMPARISON:  February 11, 2023.   FINDINGS:  The heart size and mediastinal contours are within normal limits.  Left-sided defibrillator is unchanged. Status post coronary bypass graft and cardiac valve repair. Mild bilateral perihilar interstitial densities are noted concerning for possible pulmonary edema or atypical inflammation. The visualized skeletal structures are unremarkable.    IMPRESSION:   Mild bilateral perihilar interstitial densities are noted concerning for possible pulmonary edema or atypical inflammation.   Electronically Signed,   By: Lupita Raider M.D.     On: 03/18/2023 09:42    EXAM:  CHEST - 2 VIEW   COMPARISON:  03/10/2023; 02/11/2023   FINDINGS:  Unchanged enlarged cardiac silhouette and mediastinal contours post median sternotomy, CABG and valve  replacement. Stable positioning of support apparatus. Improved aeration of the lungs. No focal airspace opacities. No pleural effusion or pneumothorax no evidence of edema.   No acute osseous abnormalities. Postcholecystectomy.   IMPRESSION:  Improved aeration of the lungs without acute cardiopulmonary disease.   Electronically Signed      By: Simonne Come M.D.   On: 03/23/2023 17:05   No results found.  Procedures Procedures (including critical care time)  Medications Ordered in UC Medications  methylPREDNISolone acetate (DEPO-MEDROL) injection 40 mg (40 mg Intramuscular Given 04/16/23 1628)    Initial Impression / Assessment and Plan / UC Course  I have reviewed the triage vital signs and the nursing notes.  Pertinent labs &  imaging results that were available during my care of the patient were reviewed by me and considered in my medical decision making (see chart for details).     I reviewed her chest from today.  It shows some changes of COPD but does not seem to show will CHF, community-acquired pneumonia.  I will update the patient if the radiology review differs from my impression.  I do not believe she has pneumonia at this time.  No additional antibiotics needed.  I am concerned that if we keep giving her antibiotics she will get C. difficile diarrhea or other complications of antibiotic use.  I am concerned about the amount of steroids she has been getting but she is definitely short of breath at times.  Depo-Medrol, 40 mg, IM now for COVID ED exacerbation.  Use inhalers as previously provided by pulmonology.  I requested the patient contact her pulmonologist and try to get seen sooner and I am actually sent a note directly to Rubye Oaks, NP and ask her to help [place] facilitate a expedited appointment.  Follow-up here if needed. Final Clinical Impressions(s) / UC Diagnoses   Final diagnoses:  Shortness of breath  Acute cough  Chronic congestive heart failure, unspecified  heart failure type (HCC)  COPD exacerbation [facility])     Discharge Instructions      Chest x-ray is essentially negative for heart failure and pneumonia.  It does show some changes consistent with COPD.  Continue inhalers as previously prescribed for COPD including albuterol.  She has only picked up one pound this week.  Her ankles are thin and without any edema.  Will treat for a COPD exacerbation.  Use inhalers, as provided by Pulmonology.  Given Depomedrol 40mg  IM now.  Follow-up here, if needed but needs to see the Pulmonologist.     ED Prescriptions   None    PDMP not reviewed this encounter.   Prescilla Sours, FNP 04/16/23 [phone]

## 2023-04-16 NOTE — ED Triage Notes (Signed)
 Pt reports tightness in her chest she thinks she had fluid in her lungs and would like a chest x ray.

## 2023-04-16 NOTE — Telephone Encounter (Signed)
 Outpatient service line: Tachycardia  Patient called reporting that she was seen today at the urgent care and treated for COPD exacerbation.  Given steroids and quickly had tachycardia after the fact with complaints of palpitations.  Now that she is home in some time as past heart rates are now in the 110s and stable she feels much better.  She was just concerned and wanted to know whether or not she should increase her amiodarone.  I told her that it was reasonable to continue her amiodarone titration as prescribed and that once the steroids were off her heart rates are also likely to improve.  Also seems to be very anxious so with some reassurance likely will also improve.  Given ER precautions if heart rates remain sustained 140+ to call us back or call EMS if symptoms indicate.  She was seen EP within 1 month.

## 2023-04-16 NOTE — Discharge Instructions (Addendum)
 Chest x-ray is essentially negative for heart failure and pneumonia.  It does show some changes consistent with COPD.  Continue inhalers as previously prescribed for COPD including albuterol.  She has only picked up one pound this week.  Her ankles are thin and without any edema.  Will treat for a COPD exacerbation.  Use inhalers, as provided by Pulmonology.  Given Depomedrol 40mg  IM now.  Follow-up here, if needed but needs to see the Pulmonologist.

## 2023-04-17 ENCOUNTER — Encounter: Payer: Self-pay | Admitting: Physical Medicine & Rehabilitation

## 2023-04-17 ENCOUNTER — Telehealth: Payer: Self-pay

## 2023-04-17 ENCOUNTER — Encounter: Payer: 59 | Attending: Physical Medicine & Rehabilitation | Admitting: Physical Medicine & Rehabilitation

## 2023-04-17 VITALS — BP 113/75 | HR 75 | Ht 65.0 in | Wt 203.0 lb

## 2023-04-17 DIAGNOSIS — M545 Low back pain, unspecified: Secondary | ICD-10-CM | POA: Diagnosis not present

## 2023-04-17 DIAGNOSIS — G2581 Restless legs syndrome: Secondary | ICD-10-CM | POA: Insufficient documentation

## 2023-04-17 DIAGNOSIS — Z79891 Long term (current) use of opiate analgesic: Secondary | ICD-10-CM | POA: Diagnosis not present

## 2023-04-17 DIAGNOSIS — G8929 Other chronic pain: Secondary | ICD-10-CM | POA: Diagnosis not present

## 2023-04-17 DIAGNOSIS — G894 Chronic pain syndrome: Secondary | ICD-10-CM | POA: Insufficient documentation

## 2023-04-17 NOTE — Progress Notes (Signed)
 Subjective:    Patient ID: Kimberly Hobbs, female    DOB: January 21, 1961, 63 y.o.   MRN: [id number]  HPI  Kimberly Hobbs is a 63 y.o. year old female  who  has a past medical history of CKD stage 3 due to type 2 diabetes mellitus (HCC) (05/15/2021), COVID-19, Hyperlipidemia, Hypertension, Pain in both lower extremities (07/23/2019), and Rheumatic heart disease.   They are presenting to PM&R clinic as a new patient for pain management evaluation. They were referred by [name] for treatment of low back pain.  She cant stand for long. Has to use a walker.  Pain is severe if she walks without this. Pain started about 2 years ago but is worsening. 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  Pain is mostly on the right side of her lower back. Sometimes both sides. It doesn't shoot down her legs.  Lying down helps the pain.  She reports her activities are very limited by her pain.  She has been taking hydrocodone 5/325 1 pain is very severe. History of prosthetic valves, schedule for ablation for A-fib       Red flag symptoms: No red flags for back pain endorsed in Hx or ROS   Medications tried: Topical medications - doesn't recall name, didn't help Nsaids- cannot take for cardiac reasons Tylenol- doesn't help Opiates  Hydrocodone -she uses sparingly when pain is very severe, this does help control her pain Gabapentin / Lyrica  Has not tried  Tramadol-does not remember TCAs  - Denies  She is currently on Celexa for her anxiety, this medication visit has been working very well for her Robaxin helps pain   Other treatments: PT/OT  has not tried, in afib waiting on heart ablation  TENs unit - has not tried  Injections-denies Surgery - denies      Interal History 08/23/22   Kimberly Hobbs is here for follow-up regarding her chronic pain.  She reports she continues to have right-sided back pain.  She also has some aching pain in her  legs bilaterally.  She reports she has her cardiac ablation procedures 09/05/2022 so after this time she would be interested in getting facet joint injections.  Pain is worsened with standing and she continues to use her walker for ambulation.  Pain continues to be improved with lying down.  Vicodin 7.5 is helping her control this pain.  She tried to use this sparingly only when pain is very severe and 30 tablets has lasted her about a month and a half.  She is not having any side effects with this medication.   Interal History 10/29/2022 Kimberly Hobbs is here for follow-up regarding her right-sided back pain with aching pain in her legs.  She continues to use occasional Vicodin 7.5 which helps keep her pain controlled when it is very severe.  She tries to use this sparingly.  She has been started on gabapentin 100 mg at bedtime which has greatly reduced her bilateral leg pain.  She reports her Celexa was discontinued because it was no longer helping.  She says she is currently not on any antidepressant medicines for anxiety.  She has had a few sessions of physical therapy but has not had significant benefits at this time. Patient has been using occasional Flexeril 10 mg with benefit, she does not use this when she takes for Norco.       Interal History 01/07/23 [name] is here for follow-up regarding her right-sided back pain and leg pain.  She continues to use Vicodin 7.5 mg sparingly when the pain is severe.  She still has most of her pills from her last refill.  Leg pain continues to be improved since starting gabapentin.  She stopped using Cymbalta, says she did not tolerate this and made her feel bad.  She reports pain is not significantly improved after physical therapy.     Interal History 03/19/22 Kimberly Hobbs reports that she has been having worse pain in her lower back more on the right side.  When she does activity she will have pain all the way across her back.  Hydrocodone not helping pain is  much as it previously did.  Patient is currently following with cardiology working on treatment of her arrhythmia.  She is now restarted on amiodarone.  She had a dual-chamber ICD implanted 02/10/2023.  Interval history 04/17/2023 Kimberly Hobbs is here for follow-up regarding her back pain.  She reports her back pain is doing better, not having help her husband as much as he has been more active himself.  He does have another surgery scheduled.  She is only requiring use of oxycodone sparingly and it is helping her pain.  Pain Inventory Average Pain 9 Pain Right Now 1 My pain is sharp, burning, dull, stabbing, tingling, and aching  In the last 24 hours, has pain interfered with the following? General activity 0 Relation with others 0 Enjoyment of life 0 What TIME of day is your pain at its worst? night Sleep (in general) Fair  Pain is worse with: standing and some activites Pain improves with: rest and medication Relief from Meds: 5  Family History  Problem Relation Age of Onset   Drug abuse Mother    Alcohol abuse Mother    Anxiety disorder Mother    Cancer Mother    Hypertension Mother    Diabetes Mother    Hyperlipidemia Mother    Heart disease Mother    Drug abuse Father    Alcohol abuse Father    Anxiety disorder Father    Cancer Father    Hypertension Father    Diabetes Father    Heart disease Father    Hypertension Sister    Hypertension Brother    Hypertension Brother    Schizophrenia Maternal Grandmother    Cancer Daughter    COPD Daughter    Breast cancer Neg Hx    Social History   Socioeconomic History   Marital status: Divorced    Spouse name: Not on file   Number of children: 4   Years of education: Not on file   Highest education level: GED or equivalent  Occupational History   Not on file  Tobacco Use   Smoking status: Former    Current packs/day: 0.00    Average packs/day: 1.6 packs/day for 50.0 years (80.0 ttl pk-yrs)    Types: Cigarettes     Start date: 12/23/1976    Quit date: 12/23/2016    Years since quitting: 6.3    Passive exposure: Past   Smokeless tobacco: Never   Tobacco comments:    Former smoker 01/24/23  Vaping Use   Vaping status: Never Used  Substance and Sexual Activity   Alcohol use: Never   Drug use: Yes    Types: Hydrocodone   Sexual activity: Not Currently  Other Topics Concern   Not on file  Social History Narrative   ** Merged History  Encounter **       Epworth Sleepiness Scale = 4 (as of 06/28/2015)   Social Drivers of Health   Financial Resource Strain: Low Risk  (10/14/2022)   Overall Financial Resource Strain (CARDIA)    Difficulty of Paying Living Expenses: Not hard at all  Food Insecurity: No Food Insecurity (02/14/2023)   Hunger Vital Sign    Worried About Running Out of Food in the Last Year: Never true    Ran Out of Food in the Last Year: Never true  Transportation Needs: No Transportation Needs (02/14/2023)   PRAPARE - Administrator, Civil Service (Medical): No    Lack of Transportation (Non-Medical): No  Physical Activity: Inactive (10/14/2022)   Exercise Vital Sign    Days of Exercise per Week: 0 days    Minutes of Exercise per Session: 0 min  Stress: Stress Concern Present (10/14/2022)   [employer] of Occupational Health - Occupational Stress Questionnaire    Feeling of Stress : To some extent  Social Connections: Moderately Isolated (10/14/2022)   Social Connection and Isolation Panel [NHANES]    Frequency of Communication with Friends and Family: More than three times a week    Frequency of Social Gatherings with Friends and Family: Never    Attends Religious Services: Never    Database administrator or Organizations: No    Attends Banker Meetings: Never    Marital Status: Living with partner   Past Surgical History:  Procedure Laterality Date   ATRIAL FIBRILLATION ABLATION N/A 09/05/2022   Procedure: ATRIAL FIBRILLATION ABLATION;  Surgeon:  Regan Lemming, MD;  Location: [facility];  Service: Cardiovascular;  Laterality: N/A;   CARDIAC CATHETERIZATION     CARDIAC VALVE REPLACEMENT     CARDIOVERSION N/A 04/02/2022   Procedure: CARDIOVERSION;  Surgeon: Elder Negus, MD;  Location: [facility];  Service: Cardiovascular;  Laterality: N/A;   CARDIOVERSION N/A 04/27/2022   Procedure: CARDIOVERSION;  Surgeon: Elder Negus, MD;  Location: [facility];  Service: Cardiovascular;  Laterality: N/A;   CARDIOVERSION N/A 03/27/2023   Procedure: CARDIOVERSION;  Surgeon: Maisie Fus, MD;  Location: [facility];  Service: Cardiovascular;  Laterality: N/A;   CHOLECYSTECTOMY     CORONARY ARTERY BYPASS GRAFT     CORONARY/GRAFT ANGIOGRAPHY N/A 08/18/2018   Procedure: CORONARY/GRAFT ANGIOGRAPHY;  Surgeon: Elder Negus, MD;  Location: [facility];  Service: Cardiovascular;  Laterality: N/A;   ELECTROPHYSIOLOGY STUDY N/A 02/04/2023   Procedure: ELECTROPHYSIOLOGY STUDY;  Surgeon: Regan Lemming, MD;  Location: [facility];  Service: Cardiovascular;  Laterality: N/A;   ICD IMPLANT N/A 02/10/2023   Procedure: ICD IMPLANT;  Surgeon: Regan Lemming, MD;  Location: [facility]VASIVE CV LAB;  Service: Cardiovascular;  Laterality: N/A;   LEG SURGERY     "metal plate in leg"   RIGHT HEART CATH AND CORONARY/GRAFT ANGIOGRAPHY N/A 07/21/2018   Procedure: RIGHT HEART CATH AND CORONARY/GRAFT ANGIOGRAPHY;  Surgeon: Elder Negus, MD;  [facility]: [facility];  Service: Cardiovascular;  Laterality: N/A;   TEE WITHOUT CARDIOVERSION N/A 07/27/2015   Procedure: TRANSESOPHAGEAL ECHOCARDIOGRAM (TEE);  Surgeon: Thurmon Fair, MD;  Location: [facility] ENDOSCOPY;  Service: Cardiovascular;  Laterality: N/A;   TEE WITHOUT CARDIOVERSION N/A 07/21/2018   Procedure: TRANSESOPHAGEAL ECHOCARDIOGRAM (TEE);  Surgeon: Elder Negus, MD;  Location: [facility] ENDOSCOPY;  Service: Cardiovascular;  Laterality: N/A;    TONSILLECTOMY AND ADENOIDECTOMY     Past Surgical  History:  Procedure Laterality Date   ATRIAL FIBRILLATION ABLATION N/A 09/05/2022   Procedure: ATRIAL FIBRILLATION ABLATION;  Surgeon: Regan Lemming, MD;  [facility];  Service: Cardiovascular;  Laterality: N/A;   CARDIAC CATHETERIZATION     CARDIAC VALVE REPLACEMENT     CARDIOVERSION N/A 04/02/2022   Procedure: CARDIOVERSION;  Surgeon: Elder Negus, MD;  [facility]: [facility];  Service: Cardiovascular;  Laterality: N/A;   CARDIOVERSION N/A 04/27/2022   Procedure: CARDIOVERSION;  Surgeon: Elder Negus, MD;  Location: [facility];  Service: Cardiovascular;  Laterality: N/A;   CARDIOVERSION N/A 03/27/2023   Procedure: CARDIOVERSION;  Surgeon: Maisie Fus, MD;  [facility];  Service: Cardiovascular;  Laterality: N/A;   CHOLECYSTECTOMY     CORONARY ARTERY BYPASS GRAFT     CORONARY/GRAFT ANGIOGRAPHY N/A 08/18/2018   Procedure: CORONARY/GRAFT ANGIOGRAPHY;  Surgeon: Elder Negus, MD;  [facility];  Service: Cardiovascular;  Laterality: N/A;   ELECTROPHYSIOLOGY STUDY N/A 02/04/2023   Procedure: ELECTROPHYSIOLOGY STUDY;  Surgeon: Regan Lemming, MD;  [facility];  Service: Cardiovascular;  Laterality: N/A;   ICD IMPLANT N/A 02/10/2023   Procedure: ICD IMPLANT;  Surgeon: Regan Lemming, MD;  Location: [facility] INVASIVE CV LAB;  Service: Cardiovascular;  Laterality: N/A;   LEG SURGERY     "metal plate in leg"   RIGHT HEART CATH AND CORONARY/GRAFT ANGIOGRAPHY N/A 07/21/2018   Procedure: RIGHT HEART CATH AND CORONARY/GRAFT ANGIOGRAPHY;  Surgeon: Elder Negus, MD;  [facility];  Service: Cardiovascular;  Laterality: N/A;   TEE WITHOUT CARDIOVERSION N/A 07/27/2015   Procedure: TRANSESOPHAGEAL ECHOCARDIOGRAM (TEE);  Surgeon: Thurmon Fair, MD;  [facility] ENDOSCOPY;  Service: Cardiovascular;  Laterality: N/A;   TEE  WITHOUT CARDIOVERSION N/A 07/21/2018   Procedure: TRANSESOPHAGEAL ECHOCARDIOGRAM (TEE);  Surgeon: Elder Negus, MD;  Location: [facility] ENDOSCOPY;  Service: Cardiovascular;  Laterality: N/A;   TONSILLECTOMY AND ADENOIDECTOMY     Past Medical History:  Diagnosis Date   Allergy ?   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 01/01/2017 01/01/2017   BP 113/75   Pulse 75   Ht 5\' 5"  (1.651 m)   Wt 203 lb (92.1 kg)   LMP 06/20/2015   SpO2 96%   BMI 33.78 kg/m   Opioid Risk Score:   Fall Risk Score:  `1  Depression screen [facility] 2/9     04/14/2023    1:38 PM 01/07/2023    9:27 AM 12/23/2022   10:25 AM 10/14/2022   10:49 AM 08/23/2022   10:32 AM 07/08/2022    9:52 AM 06/10/2022    9:24 AM  Depression screen PHQ 2/9  Decreased Interest  1 0 0 1 0 1  Down, Depressed, Hopeless  1 0 1 1 0 0  PHQ - 2 Score  2 0 1 2 0 1  Altered sleeping  3 0   3  Tired, decreased energy   1 1   2   Change in appetite   3 0   3  Feeling bad or failure about yourself    0 0   0  Trouble concentrating   0 0   0  Moving slowly or fidgety/restless   0 0   0  Suicidal thoughts   0 0   0  PHQ-9 Score   7 2   9   Difficult doing work/chores   Not difficult at all Somewhat difficult   Very difficult     Information is confidential and restricted. Go to Review Flowsheets to unlock data.     Review of Systems  Musculoskeletal:  Positive for back pain.  All other systems reviewed and are  negative.      Objective:   Physical Exam  Gen: no distress, normal appearing HEENT: oral mucosa pink and moist, NCAT Cardio: Reg rate Chest: normal effort, normal rate of breathing Abd: soft, non-distended Ext: no edema Psych: pleasant, normal affect Skin: intact Neuro: Alert and awake, follows commands, cranial nerves II through XII intact, intact insight and judgment, speech and language intact Strength 5 out of 5 in all 4 extremities Sensation intact light touch in all 4 extremities No ankle clonus Musculoskeletal:  Tenderness over paraspinal muscles lower lumbar spine right greater than left Slump test negative bilaterally Pain reported with spinal extension   Prior exam:  Mild tenderness noted over the right SI joint   MRI L-spine 1/17/2024IMPRESSION: 1. L4-L5 moderate facet arthropathy, which can be a cause of back pain. Narrowing of the lateral recesses at this level could affect the descending L5 nerve roots. 2. No spinal canal stenosis or neural foraminal narrowing.       Assessment & Plan:   Chronic lower back pain bilateral but worse on the right without radiculopathy -No improvement with Tylenol, unable to take NSAIDs due to cardiac history -She has some tenderness over her right SI joint however do not think this is the main source of her pain at this time -Pain is primarily axial in her lower back -List of foods for pain provided at prior visit -ORT low -TENS unit, nexwave device ordered prior visit -Discontinued Norco 7.5 twice daily prior visit -Continue Percocet 5 twice daily as needed, advised to call when she needs a refill -Continue current dose of gabapentin -Cymbalta 20 mg daily- She did not tolerate -She reports Physical Therapy provided minimal improvement -Pain agreement completed prior visit -Continue Flexeril 10 mg up to 3 times daily as needed, however advised to continue using sparingly and she reports doing this -Discussed facet joint  injections B/L L3-4, L4-5, R L5-S1-I think this would be a good option.  She is currently on anticoagulation but may be able to come off after a cardiac ablation -Continue UDS, Pill Counts, PDMP monitoring  -She is on gabapentin for restless leg.  She reports tightness in her legs at night that the gabapentin reduced.  We had tried 200 mg gabapentin, appears she is back on the 100 mg dose? -She forgot pills, verbal warning today -After visit PDMP review indicates she get codeine cough syrup-will need to give her a warning regarding this      Chronic anxiety, denies SI or HI -No longer on Celexa, Cymbalta- she says "it made her feel bad" -Patient has follow-up with psychiatry later this month

## 2023-04-17 NOTE — Telephone Encounter (Signed)
 I called pt at home phone number and left a voicemail stating how [facility] has an opening at [facility] [place] 04/18/23 at 8:30am and 9:00am. Pt has copd exacerbation and needs to be seen sooner then she was originally scheduled for 05/22/23. If pt does not want to commute to [place] we can arrange for soonest available here. Whichever pt prefers.

## 2023-04-17 NOTE — Telephone Encounter (Signed)
 LVM for pt to call office to give her the below information from PCP.   [name], PA  You2 hours ago (10:14 AM)    Agree with recommendation to call 911 if HR remains consistently above 140. Also recommend calling cardiology as they are monitoring her pacemaker

## 2023-04-18 ENCOUNTER — Encounter: Payer: Self-pay | Admitting: Nurse Practitioner

## 2023-04-18 ENCOUNTER — Ambulatory Visit: Payer: 59 | Admitting: Nurse Practitioner

## 2023-04-18 VITALS — BP 122/68 | HR 81 | Ht 65.0 in | Wt 205.0 lb

## 2023-04-18 DIAGNOSIS — G4733 Obstructive sleep apnea (adult) (pediatric): Secondary | ICD-10-CM | POA: Diagnosis not present

## 2023-04-18 DIAGNOSIS — J441 Chronic obstructive pulmonary disease with (acute) exacerbation: Secondary | ICD-10-CM

## 2023-04-18 DIAGNOSIS — I480 Paroxysmal atrial fibrillation: Secondary | ICD-10-CM | POA: Diagnosis not present

## 2023-04-18 DIAGNOSIS — J449 Chronic obstructive pulmonary disease, unspecified: Secondary | ICD-10-CM

## 2023-04-18 MED ORDER — PREDNISONE 10 MG PO TABS: ORAL_TABLET | ORAL | 0 refills | Status: DC

## 2023-04-18 MED ORDER — BREZTRI AEROSPHERE 160-9-4.8 MCG/ACT IN AERO: 2.0000 | INHALATION_SPRAY | Freq: Two times a day (BID) | RESPIRATORY_TRACT | 11 refills | Status: AC

## 2023-04-18 NOTE — Patient Instructions (Addendum)
 Stop Stiolto. Start Breztri 2 puffs Twice daily. Brush tongue and rinse mouth afterwards Continue Albuterol inhaler 2 puffs every 6 hours as needed for shortness of breath or wheezing. Notify if symptoms persist despite rescue inhaler/neb use. Continue singulair 1 tab daily   Prednisone 10 mg daily for 5 days then 5 mg daily for 5 days. Take in AM with food  Restart CPAP every night, minimum of 4-6 hours a night.  Change equipment as directed. Wash your tubing with warm soap and water daily, hang to dry. Wash humidifier portion weekly. Use bottled, distilled water and change daily Be aware of reduced alertness and do not drive or operate heavy machinery if experiencing this or drowsiness.  Exercise encouraged, as tolerated. Healthy weight management discussed.  Avoid or decrease alcohol consumption and medications that make you more sleepy, if possible. Notify if persistent daytime sleepiness occurs even with consistent use of PAP therapy.   Orders placed for new CPAP with DreamWear nasal cradle mask  We discussed how untreated sleep apnea puts an individual at risk for cardiac arrhthymias, pulm HTN, DM, stroke and increases their risk for daytime accidents. We also briefly reviewed treatment options including weight loss, side sleeping position, oral appliance, CPAP therapy or referral to ENT for possible surgical options  Follow up with cardiology as scheduled   Follow up in 6 weeks with Dr. Celine Mans or Philis Nettle. If symptoms do not improve or worsen, please contact office for sooner follow up or seek emergency care.

## 2023-04-18 NOTE — Assessment & Plan Note (Signed)
 Moderate OSA. Difficulties with CPAP due to humidity problems. Machine is old and could be malfunctioning. Will order replacement machine and change her to a DreamWear nasal cradle mask. She's had an updated study within the last year so should not need to repeat one.  Reviewed risks of untreated sleep apnea and correlation to her current cardiac problems.  She is willing to continue on therapy.  Healthy weight loss encouraged.  Aware of proper care/use of device.  Reassess response at follow-up.  Safe driving practices reviewed.

## 2023-04-18 NOTE — Progress Notes (Signed)
 @Patient  ID: Kimberly Hobbs, female    DOB: [birth year], 63 y.o.   MRN: [id number]  Chief Complaint  Patient presents with   COPD    UC 04/16/23    Referring provider: [name], PA  HPI: 63 year old female, former smoker followed for COPD mixed type and OSA on CPAP. She is a patient of Dr. Humphrey Rolls. Past medical history significant for CAD, HTN, PAF on coumadin, rheumatic valvular disease, hx of VT s/p pacemaker/ICD, CKD, DM, hx of mitral valve replacement.   TEST/EVENTS:  07/07/2019 PFT: FVC 74, FEV1 73, ratio 79, TLC 107, DLCOcor 81. Positive BD (10%) 04/20/2022 HST: AHI 25.5/h, SpO2 low 76% 09/24/2022 CPAP titration >> auto 4-12 cmH2O 04/16/2023 CXR: No consolidation or acute process.  Mild cardiomegaly  09/27/2022: OV with Dr. Maple Hudson.  Having problems with CPAP.  Mask is feeling up with moisture and mask is popping off.  Fits poorly.  Humidifier overactive.  Feels CPAP set too high.  Advised to adjust humidity settings.  Change settings to 4-10 cmH2O.  Order new supplies.  COPD feels fairly well-controlled without recent exacerbation.  04/18/2023: Today - acute Patient presents today for acute visit.  She has been struggling with her A-fib recently.  She had a cardioversion earlier this month which was initially successful but she went back into A-fib not long afterwards.  She was seen in the ED a few times as well and diagnosed with COPD exacerbations.  Most recently 04/16/2023.  CXR was clear.  She had already been treated the end of January with prednisone and doxycycline for 10 days.  She did have a chest x-ray mid January with concern for possible pneumonia but follow-ups had shown resolution.  During this last emergency room visit, she was treated with Depo-Medrol injection and discharged home. She does feel better but she is continue to have difficulties with her breathing.  She thinks a lot of it is coming from her heart.  She does have a cough with clear phlegm.  Has some chest  tightness and occasional wheezing.  Denies any fevers, chills, hemoptysis, lower extremity swelling, weight gain, orthopnea, lightheadedness or dizziness.  She is currently on Stiolto but is not sure if it is doing much for her.  She does receive benefit from use of her albuterol rescue inhaler.  She is very sensitive to corticosteroids.  Tend to make her heart race more.  She also been having trouble with her CPAP.  Changes of her pressures did not seem to stop the water from accumulating and she also still has quite a bit of leaks.  Her current machine is quite old.  She would like to see if she get a new one that could fix this problem.  She would also like to try a different mask.  She is currently wearing a fullface and just has never gotten quite comfortable with it.  Does have daytime fatigue.  Denies any drowsy driving or sleep parasomnia/paralysis. Download not available. Last HST was less than a year ago with moderate OSA  Allergies  Allergen Reactions   Duloxetine Hcl Other (See Comments)    Behavioral changes, sleep disturbances   Iodinated Contrast Media Other (See Comments)    Patient is unsure of reaction type   Penicillins Other (See Comments)    Immune to drug , does not work per patient    Doxycycline Nausea And Vomiting    Immunization History  Administered Date(s) Administered   Fluad Quad(high Dose 65+) 02/01/2021  Fluad Trivalent(High Dose 65+) 10/28/2022   Influenza,inj,Quad PF,6+ Mos 12/13/2021   Influenza-Unspecified 01/14/2019   Zoster Recombinant(Shingrix) 05/19/2021, 09/18/2021    Past Medical History:  Diagnosis Date   Allergy ?   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   COPD (chronic obstructive pulmonary disease) (HCC)    COVID-19    GERD (gastroesophageal reflux disease)    H/O mitral valve replacement with mechanical #25 mechanical SJM 01/01/2017) 01/01/2017    Hyperlipidemia    Hypertension    Pain in both lower extremities 07/23/2019   Paroxysmal atrial fibrillation (HCC) 06/29/2015   Rheumatic heart disease    MS/ AS   Sleep apnea    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 01/01/2017 01/01/2017    Tobacco History: Social History   Tobacco Use  Smoking Status Former   Current packs/day: 0.00   Average packs/day: 1.6 packs/day for 50.0 years (80.0 ttl pk-yrs)   Types: Cigarettes   Start date: 12/23/1976   Quit date: 12/23/2016   Years since quitting: 6.3   Passive exposure: Past  Smokeless Tobacco Never  Tobacco Comments   Former smoker 01/24/23   Counseling given: Not Answered Tobacco comments: Former smoker 01/24/23   Outpatient Medications Prior to Visit  Medication Sig Dispense Refill   Accu-Chek Softclix Lancets lancets Use as instructed (Patient taking differently: 1 each by Other route See admin instructions. Use as instructed) 100 each 12   acetaminophen (TYLENOL) 325 MG tablet Take 2 tablets (650 mg total) by mouth every 4 (four) hours as needed for headache or mild pain (pain score 1-3).     albuterol (VENTOLIN HFA) 108 (90 Base) MCG/ACT inhaler Inhale 1-2 puffs into the lungs every 6 (six) hours as needed for wheezing or shortness of breath.     amiodarone (PACERONE) 200 MG tablet Take 1 tablet (200 mg) twice daily for 2 weeks, then decrease to 200 mg daily (Patient taking differently: Take 200 mg by mouth daily. Take 1 tablet (200 mg) twice daily for 2 weeks, then decrease to 200 mg daily) 90 tablet 1   aspirin EC 81 MG tablet Take 81 mg by mouth daily.      Blood Glucose Monitoring Suppl (ONE TOUCH ULTRA 2) w/Device KIT 3 (three) times daily.     clonazePAM (KLONOPIN) 0.5 MG tablet Take 1.5 tablets (0.75 mg total) by mouth at bedtime. 45 tablet 0   furosemide (LASIX) 20 MG tablet Take 20 mg by mouth 2 (two) times daily.     gabapentin (NEURONTIN) 100 MG capsule Take 1 capsule (100 mg total) by mouth  at bedtime.     Glucose Blood (BLOOD GLUCOSE TEST STRIPS) STRP 1 each by In Vitro route in the morning, at noon, and at bedtime. May substitute to any manufacturer covered by patient's insurance. 100 strip 11   guaiFENesin-codeine 100-10 MG/5ML syrup Take 5 mLs by mouth every 6 (six) hours as needed for cough. 120 mL 0   isosorbide mononitrate (IMDUR) 60 MG 24 hr tablet Take 1 tablet (60 mg total) by mouth daily. 90 tablet 3   Lancet Device MISC 1 each by Does not apply route in the morning, at noon, and at bedtime. May substitute to any manufacturer covered by patient's insurance. 1 each 0   Lancets Misc. MISC 1 each by Does not apply route in the morning, at noon, and at bedtime.  May substitute to any manufacturer covered by patient's insurance. 100 each 0   magnesium oxide (MAG-OX) 400 (240 Mg) MG tablet Take 1 tablet (400 mg total) by mouth daily. 30 tablet 6   metFORMIN (GLUCOPHAGE) 500 MG tablet Take 1 tablet (500 mg total) by mouth 2 (two) times daily with a meal. 180 tablet 3   metoprolol succinate (TOPROL-XL) 50 MG 24 hr tablet Take 50 mg by mouth 2 (two) times daily.     montelukast (SINGULAIR) 10 MG tablet Take 1 tablet (10 mg total) by mouth daily. 30 tablet 11   nitrofurantoin, macrocrystal-monohydrate, (MACROBID) 100 MG capsule Take 100 mg by mouth 2 (two) times daily.     oxyCODONE-acetaminophen (PERCOCET) 5-325 MG tablet Take 1 tablet by mouth every 12 (twelve) hours as needed for severe pain (pain score 7-10). 60 tablet 0   pantoprazole (PROTONIX) 40 MG tablet Take 1 tablet (40 mg total) by mouth 2 (two) times daily. 180 tablet 0   potassium chloride SA (KLOR-CON M) 20 MEQ tablet Take 1 tablet (20 mEq total) by mouth daily. Take 2 tablets once at dinner 03/26/23 and then 1 tablet daily starting 03/27/23 30 tablet 0   ramelteon (ROZEREM) 8 MG tablet Take 1 tablet (8 mg total) by mouth at bedtime. 30 tablet 1   rosuvastatin (CRESTOR) 40 MG tablet TAKE 1 TABLET(40 MG) BY MOUTH DAILY 90  tablet 1   sacubitril-valsartan (ENTRESTO) 24-26 MG Take 1 tablet by mouth 2 (two) times daily. 60 tablet 3   sertraline (ZOLOFT) 50 MG tablet 25 mg at night for one week, then 50 mg at night 30 tablet 1   warfarin (COUMADIN) 5 MG tablet Take 1 tablet (5 mg total) by mouth 4 (four) times a week. Sat, Tues, Thurs, and Sun     warfarin (COUMADIN) 7.5 MG tablet Take 7.5 mg by mouth daily.     Tiotropium Bromide-Olodaterol (STIOLTO RESPIMAT) 2.5-2.5 MCG/ACT AERS Inhale 2 puffs into the lungs daily. 4 g 6   Blood Glucose Monitoring Suppl DEVI 1 each by Does not apply route in the morning, at noon, and at bedtime. May substitute to any manufacturer covered by patient's insurance. 1 each 0   clonazePAM (KLONOPIN) 0.5 MG tablet Take 1 tablet (0.5 mg total) by mouth at bedtime as needed for anxiety. 30 tablet 1   metoprolol succinate (TOPROL-XL) 100 MG 24 hr tablet Take 1 tablet (100 mg total) by mouth 2 (two) times daily. Take with or immediately following a meal. 60 tablet 6   No facility-administered medications prior to visit.     Review of Systems:   Constitutional: No weight loss or gain, night sweats, fevers, chills, or lassitude. +fatigue  HEENT: No headaches, difficulty swallowing, tooth/dental problems, or sore throat. No sneezing, itching, ear ache, nasal congestion, or post nasal drip CV:  +palpitations. No chest pain, orthopnea, PND, swelling in lower extremities, anasarca, dizziness, syncope Resp:+shortness of breath with exertion; cough; wheezing. No excess mucus or change in color of mucus. No hemoptysis.No chest wall deformity GI:  No heartburn, indigestion, abdominal pain, nausea, vomiting, diarrhea, change in bowel habits, loss of appetite, bloody stools.  GU: No dysuria, change in color of urine, urgency or frequency.  No flank pain, no hematuria  Skin: No rash, lesions, ulcerations MSK:  No joint pain or swelling.  No decreased range of motion.  No back pain. Neuro: No dizziness  or lightheadedness.  Psych: No depression or anxiety. Mood stable.     Physical Exam:  BP 122/68   Pulse 81   Ht 5\' 5"  (1.651 m)   Wt 205 lb (93 kg)   LMP 06/20/2015   SpO2 98%   BMI 34.11 kg/m   GEN: Pleasant, interactive, well-appearing; obese; in no acute distress HEENT:  Normocephalic and atraumatic. PERRLA. Sclera white. Nasal turbinates pink, moist and patent bilaterally. No rhinorrhea present. Oropharynx pink and moist, without exudate or edema. No lesions, ulcerations, or postnasal drip. Mallampati III NECK:  Supple w/ fair ROM. No JVD present. Normal carotid impulses w/o bruits. Thyroid symmetrical with no goiter or nodules palpated. No lymphadenopathy.   CV: RRR, no m/r/g, no peripheral edema. Pulses intact, +2 bilaterally. No cyanosis, pallor or clubbing. PULMONARY:  Unlabored, regular breathing. Scattered wheezes bilaterally A&P. No accessory muscle use.  GI: BS present and normoactive. Soft, non-tender to palpation. No organomegaly or masses detected.  MSK: No erythema, warmth or tenderness. Cap refil <2 sec all extrem. No deformities or joint swelling noted.  Neuro: A/Ox3. No focal deficits noted.   Skin: Warm, no lesions or rashe Psych: Normal affect and behavior. Judgement and thought content appropriate.     Lab Results:  CBC    Component Value Date/Time   WBC 8.8 03/21/2023 1139   WBC 6.9 02/11/2023 0347   RBC 4.46 03/21/2023 1139   RBC 3.50 (L) 02/11/2023 0347   HGB 11.6 (L) 03/26/2023 1223   HGB 11.8 03/21/2023 1139   HCT 34.0 (L) 03/26/2023 1223   HCT 39.0 03/21/2023 1139   PLT 282 03/21/2023 1139   MCV 87 03/21/2023 1139   MCH 26.5 (L) 03/21/2023 1139   MCH 30.3 02/11/2023 0347   MCHC 30.3 (L) 03/21/2023 1139   MCHC 32.3 02/11/2023 0347   RDW 15.1 03/21/2023 1139   LYMPHSABS 2.4 02/03/2023 1545   LYMPHSABS 2.1 12/23/2022 1113   MONOABS 0.9 02/03/2023 1545   EOSABS 0.4 02/03/2023 1545   EOSABS 0.8 (H) 12/23/2022 1113   BASOSABS 0.1 02/03/2023  1545   BASOSABS 0.1 12/23/2022 1113    BMET    Component Value Date/Time   NA 141 03/27/2023 1222   NA 141 02/26/2023 0946   K 4.0 03/27/2023 1222   CL 104 03/27/2023 1222   CO2 23 03/27/2023 1222   GLUCOSE 114 (H) 03/27/2023 1222   BUN 25 (H) 03/27/2023 1222   BUN 24 02/26/2023 0946   CREATININE 1.48 (H) 03/27/2023 1222   CALCIUM 9.7 03/27/2023 1222   GFRNONAA 40 (L) 03/27/2023 1222   GFRAA >60 07/28/2018 0713    BNP    Component Value Date/Time   BNP 68.1 02/03/2023 1546     Imaging:  DG Chest 2 View Result Date: 04/16/2023 CLINICAL DATA:  Cough and shortness of breath.  Chronic CHF. EXAM: CHEST - 2 VIEW COMPARISON:  Chest radiograph dated 03/21/2023. FINDINGS: No focal consolidation, pleural effusion, or pneumothorax. Mild cardiomegaly. The pectoral AICD device. Median sternotomy wires and CABG vascular clips. Atherosclerotic calcification of the aorta. No acute osseous pathology. IMPRESSION: 1. No active cardiopulmonary disease. 2. Mild cardiomegaly. Electronically Signed   By: Elgie Collard M.D.   On: 04/16/2023 16:57   CUP PACEART INCLINIC DEVICE CHECK Result Date: 04/03/2023 Normal in-clinic ICD check. Thresholds, sensing, and impedance WNL or stable for patient over time. Freq AF episodes. Estimated longevity 11.7 years____ . Pt enrolled in remote follow-up. Percell Belt, NP  EP STUDY Result Date: 03/27/2023 See surgical note for result.  DG Chest 2 View Result Date: 03/23/2023 CLINICAL DATA:  Shortness of  breath and cough for the past year and a half. History of COPD, hypertension and diabetes. Former smoker. EXAM: CHEST - 2 VIEW COMPARISON:  03/10/2023; 02/11/2023 FINDINGS: Unchanged enlarged cardiac silhouette and mediastinal contours post median sternotomy, CABG and valve replacement. Stable positioning of support apparatus. Improved aeration of the lungs. No focal airspace opacities. No pleural effusion or pneumothorax no evidence of edema. No acute osseous  abnormalities. Postcholecystectomy. IMPRESSION: Improved aeration of the lungs without acute cardiopulmonary disease. Electronically Signed   By: Simonne Come M.D.   On: 03/23/2023 17:05   CUP PACEART INCLINIC DEVICE CHECK Result Date: 03/21/2023 in-clinic ICD check. Thresholds, sensing, and impedance WNL or stable for patient over time. No VT episodes. Estimated longevity _10.5 years___ . Pt enrolled in remote follow-up. arrives in AFlutter, office note discusses attempts today to pace terminate (by Dr. Lalla Brothers) were unsuccessful next managements strategies as per OV from today   Administration History     None          Latest Ref Rng & Units 07/07/2019    8:58 AM  PFT Results  FVC-Pre L 2.60   FVC-Predicted Pre % 74   FVC-Post L 2.83   FVC-Predicted Post % 80   Pre FEV1/FVC % % 77   Post FEV1/FCV % % 79   FEV1-Pre L 2.00   FEV1-Predicted Pre % 73   FEV1-Post L 2.22   DLCO uncorrected ml/min/mmHg 17.22   DLCO UNC% % 81   DLCO corrected ml/min/mmHg 17.22   DLCO COR %Predicted % 81   DLVA Predicted % 89   TLC L 5.58   TLC % Predicted % 107   RV % Predicted % 138     No results found for: "NITRICOXIDE"      Assessment & Plan:   COPD mixed type (HCC) AECOPD. Possible asthma overlap. She has elevated peripheral eosinophils (400) and bronchodilator response on prior PFT. She would likely benefit from addition of ICS. Will treat her with lower dosed prednisone burst, as she has difficulties with high doses, and step her up to triple therapy with Breztri 2 puffs Twice daily. Side effect profile reviewed. Teachback performed. Action plan in place. CXR recently without acute process.  Suspect a component of her DOE is related to cardiac disease. Surprisingly in NSR today.   Patient Instructions  Stop Stiolto. Start Breztri 2 puffs Twice daily. Brush tongue and rinse mouth afterwards Continue Albuterol inhaler 2 puffs every 6 hours as needed for shortness of breath or wheezing.  Notify if symptoms persist despite rescue inhaler/neb use. Continue singulair 1 tab daily   Prednisone 10 mg daily for 5 days then 5 mg daily for 5 days. Take in AM with food  Restart CPAP every night, minimum of 4-6 hours a night.  Change equipment as directed. Wash your tubing with warm soap and water daily, hang to dry. Wash humidifier portion weekly. Use bottled, distilled water and change daily Be aware of reduced alertness and do not drive or operate heavy machinery if experiencing this or drowsiness.  Exercise encouraged, as tolerated. Healthy weight management discussed.  Avoid or decrease alcohol consumption and medications that make you more sleepy, if possible. Notify if persistent daytime sleepiness occurs even with consistent use of PAP therapy.   Orders placed for new CPAP with DreamWear nasal cradle mask  We discussed how untreated sleep apnea puts an individual at risk for cardiac arrhthymias, pulm HTN, DM, stroke and increases their risk for daytime accidents. We also briefly  reviewed treatment options including weight loss, side sleeping position, oral appliance, CPAP therapy or referral to ENT for possible surgical options  Follow up with cardiology as scheduled   Follow up in 6 weeks with Dr. Celine Mans or Philis Nettle. If symptoms do not improve or worsen, please contact office for sooner follow up or seek emergency care.    Paroxysmal atrial fibrillation (HCC) See above. Compliant with AC. Follow up with cardiology as scheduled  OSA (obstructive sleep apnea) Moderate OSA. Difficulties with CPAP due to humidity problems. Machine is old and could be malfunctioning. Will order replacement machine and change her to a DreamWear nasal cradle mask. She's had an updated study within the last year so should not need to repeat one.  Reviewed risks of untreated sleep apnea and correlation to her current cardiac problems.  She is willing to continue on therapy.  Healthy weight loss  encouraged.  Aware of proper care/use of device.  Reassess response at follow-up.  Safe driving practices reviewed.   Advised if symptoms do not improve or worsen, to please contact office for sooner follow up or seek emergency care.   I spent 45 minutes of dedicated to the care of this patient on the date of this encounter to include pre-visit review of records, face-to-face time with the patient discussing conditions above, post visit ordering of testing, clinical documentation with the electronic health record, making appropriate referrals as documented, and communicating necessary findings to members of the patients care team.  [name], NP 04/18/2023  Pt aware and understands NP's role.

## 2023-04-18 NOTE — Assessment & Plan Note (Signed)
 See above. Compliant with AC. Follow up with cardiology as scheduled

## 2023-04-18 NOTE — Assessment & Plan Note (Signed)
 AECOPD. Possible asthma overlap. She has elevated peripheral eosinophils (400) and bronchodilator response on prior PFT. She would likely benefit from addition of ICS. Will treat her with lower dosed prednisone burst, as she has difficulties with high doses, and step her up to triple therapy with Breztri 2 puffs Twice daily. Side effect profile reviewed. Teachback performed. Action plan in place. CXR recently without acute process.  Suspect a component of her DOE is related to cardiac disease. Surprisingly in NSR today.   Patient Instructions  Stop Stiolto. Start Breztri 2 puffs Twice daily. Brush tongue and rinse mouth afterwards Continue Albuterol inhaler 2 puffs every 6 hours as needed for shortness of breath or wheezing. Notify if symptoms persist despite rescue inhaler/neb use. Continue singulair 1 tab daily   Prednisone 10 mg daily for 5 days then 5 mg daily for 5 days. Take in AM with food  Restart CPAP every night, minimum of 4-6 hours a night.  Change equipment as directed. Wash your tubing with warm soap and water daily, hang to dry. Wash humidifier portion weekly. Use bottled, distilled water and change daily Be aware of reduced alertness and do not drive or operate heavy machinery if experiencing this or drowsiness.  Exercise encouraged, as tolerated. Healthy weight management discussed.  Avoid or decrease alcohol consumption and medications that make you more sleepy, if possible. Notify if persistent daytime sleepiness occurs even with consistent use of PAP therapy.   Orders placed for new CPAP with DreamWear nasal cradle mask  We discussed how untreated sleep apnea puts an individual at risk for cardiac arrhthymias, pulm HTN, DM, stroke and increases their risk for daytime accidents. We also briefly reviewed treatment options including weight loss, side sleeping position, oral appliance, CPAP therapy or referral to ENT for possible surgical options  Follow up with cardiology as  scheduled   Follow up in 6 weeks with Dr. Celine Mans or Philis Nettle. If symptoms do not improve or worsen, please contact office for sooner follow up or seek emergency care.

## 2023-04-22 ENCOUNTER — Ambulatory Visit: Payer: 59 | Attending: Cardiology

## 2023-04-22 DIAGNOSIS — Z5181 Encounter for therapeutic drug level monitoring: Secondary | ICD-10-CM | POA: Diagnosis not present

## 2023-04-22 DIAGNOSIS — I480 Paroxysmal atrial fibrillation: Secondary | ICD-10-CM

## 2023-04-22 LAB — POCT INR: INR: 1.9 — AB (ref 2.0–3.0)

## 2023-04-22 NOTE — Patient Instructions (Signed)
 Description   Take 10mg  today and then START taking 5mg  daily except 7.5mg  on Sundays and Fridays.  Stay consistent with greens each week (1 per week)  Recheck INR in 1 week.  Anticoagulation Clinic [phone]  *Amiodarone 200mg  daily*

## 2023-04-24 NOTE — Telephone Encounter (Signed)
 Followed up with pt who reports she is doing no better.   Heart rates are jumping up to 90s-100 when she gets up to move.  She is "just exhausted".  Says that she saw NP in [place] last month, changes were made to her device and she has felt terrible since then.  Aware I forwarding to device clinic to address and schedule her to see device RN in [place] on Monday if settings need to be adjusted and cannot do over phone. Patient verbalized understanding and agreeable to plan.

## 2023-04-25 NOTE — Telephone Encounter (Signed)
 Pt agreeable to Monday's appt.

## 2023-04-25 NOTE — Telephone Encounter (Signed)
 Rate response turned on at last office visit.  Will offer Pt device clinic appointment on Monday 04/28/2023 in [place] at 9:30 am for further reprogramming.

## 2023-04-28 ENCOUNTER — Ambulatory Visit

## 2023-04-28 DIAGNOSIS — I495 Sick sinus syndrome: Secondary | ICD-10-CM

## 2023-04-28 DIAGNOSIS — I480 Paroxysmal atrial fibrillation: Secondary | ICD-10-CM

## 2023-04-28 DIAGNOSIS — Z7901 Long term (current) use of anticoagulants: Secondary | ICD-10-CM | POA: Diagnosis not present

## 2023-04-28 DIAGNOSIS — Z952 Presence of prosthetic heart valve: Secondary | ICD-10-CM | POA: Diagnosis not present

## 2023-04-28 NOTE — Patient Instructions (Signed)
 Please contact Device Clinic if any further adjustments needed to your device.  Device Clinic [phone]  Ancil Boozer, RN

## 2023-04-28 NOTE — Progress Notes (Signed)
 Pt seen in [place] device clinic d/t issues with rate response.  Rate response was turned on at last office visit with EP d/t flat histograms and Pt c/o shortness of breath.  Per Pt she was short of breath d/t illness and underlying COPD at her last OV.   Since then Pt c/o heart racing with minimal activity.  She has stopped being active.   With Pt input-following programming changes made: Upper Sensor Rate 130 bpm -> 100 bpm ADL Rate 95 bpm ->85 bpm She will continue to monitor and contact this nurse if further adjustments needed.

## 2023-04-29 ENCOUNTER — Ambulatory Visit: Attending: Cardiology

## 2023-04-29 DIAGNOSIS — I480 Paroxysmal atrial fibrillation: Secondary | ICD-10-CM

## 2023-04-29 DIAGNOSIS — Z5181 Encounter for therapeutic drug level monitoring: Secondary | ICD-10-CM

## 2023-04-29 LAB — POCT INR: INR: 3.2 — AB (ref 2.0–3.0)

## 2023-04-29 NOTE — Patient Instructions (Signed)
 Description   Continue taking 5mg  daily except 7.5mg  on Sundays and Fridays.  Stay consistent with greens each week (1 per week)  Recheck INR in 2 weeks.  Anticoagulation Clinic [phone]  *Amiodarone 200mg  daily*

## 2023-04-30 ENCOUNTER — Ambulatory Visit: Payer: 59 | Admitting: Family Medicine

## 2023-04-30 DIAGNOSIS — E1159 Type 2 diabetes mellitus with other circulatory complications: Secondary | ICD-10-CM | POA: Diagnosis not present

## 2023-04-30 DIAGNOSIS — I152 Hypertension secondary to endocrine disorders: Secondary | ICD-10-CM | POA: Diagnosis not present

## 2023-04-30 DIAGNOSIS — R059 Cough, unspecified: Secondary | ICD-10-CM | POA: Diagnosis not present

## 2023-04-30 DIAGNOSIS — Z789 Other specified health status: Secondary | ICD-10-CM | POA: Diagnosis not present

## 2023-04-30 DIAGNOSIS — Z20822 Contact with and (suspected) exposure to covid-19: Secondary | ICD-10-CM | POA: Diagnosis not present

## 2023-05-02 ENCOUNTER — Telehealth: Payer: Self-pay

## 2023-05-02 MED ORDER — AMIODARONE HCL 200 MG PO TABS: 200.0000 mg | ORAL_TABLET | Freq: Two times a day (BID) | ORAL | Status: DC

## 2023-05-02 NOTE — Telephone Encounter (Signed)
 Call received from Pt regarding increased heart rates.  Per review of manual transmission requested-appears Pt is in atrial flutter.  Increased ventricular response is d/t this rhythm, not d/t rate response.  Pt states she thinks she went out of rhythm yesterday.  States it woke her up.  Per Pt her only symptom is heart racing.  But this makes her feel bad and she cannot do her normal activities.  Advised Pt would discuss with Afib clinic to determine plan.    Discussed with Jorja Loa.  Will have Pt go back to amiodarone 200 mg BID and follow up with a transmission on Monday.  Pt's afib has not been persistent yet.    Pt advised.  She is in agreement with plan.  Will call back on Monday.

## 2023-05-02 NOTE — Telephone Encounter (Signed)
 The pt states she was adjusted a couple of days ago to 100 bpm. Her heart rate has been above 100 and she wants to know if that is normal?

## 2023-05-05 ENCOUNTER — Emergency Department (HOSPITAL_COMMUNITY)

## 2023-05-05 ENCOUNTER — Inpatient Hospital Stay (HOSPITAL_COMMUNITY)
Admission: EM | Admit: 2023-05-05 | Discharge: 2023-05-09 | DRG: 273 | Disposition: A | Discharge status: Home / Self Care | Attending: Internal Medicine | Admitting: Internal Medicine

## 2023-05-05 ENCOUNTER — Telehealth: Payer: Self-pay | Admitting: Student in an Organized Health Care Education/Training Program

## 2023-05-05 ENCOUNTER — Other Ambulatory Visit: Payer: Self-pay

## 2023-05-05 ENCOUNTER — Encounter (HOSPITAL_COMMUNITY): Payer: Self-pay | Admitting: Emergency Medicine

## 2023-05-05 DIAGNOSIS — Z7901 Long term (current) use of anticoagulants: Secondary | ICD-10-CM | POA: Diagnosis not present

## 2023-05-05 DIAGNOSIS — F419 Anxiety disorder, unspecified: Secondary | ICD-10-CM | POA: Diagnosis present

## 2023-05-05 DIAGNOSIS — Z79899 Other long term (current) drug therapy: Secondary | ICD-10-CM | POA: Diagnosis not present

## 2023-05-05 DIAGNOSIS — Z880 Allergy status to penicillin: Secondary | ICD-10-CM

## 2023-05-05 DIAGNOSIS — Z7952 Long term (current) use of systemic steroids: Secondary | ICD-10-CM

## 2023-05-05 DIAGNOSIS — E1122 Type 2 diabetes mellitus with diabetic chronic kidney disease: Secondary | ICD-10-CM | POA: Diagnosis not present

## 2023-05-05 DIAGNOSIS — I099 Rheumatic heart disease, unspecified: Secondary | ICD-10-CM | POA: Diagnosis not present

## 2023-05-05 DIAGNOSIS — G4733 Obstructive sleep apnea (adult) (pediatric): Secondary | ICD-10-CM | POA: Diagnosis present

## 2023-05-05 DIAGNOSIS — Z888 Allergy status to other drugs, medicaments and biological substances status: Secondary | ICD-10-CM

## 2023-05-05 DIAGNOSIS — J811 Chronic pulmonary edema: Secondary | ICD-10-CM | POA: Diagnosis not present

## 2023-05-05 DIAGNOSIS — J449 Chronic obstructive pulmonary disease, unspecified: Secondary | ICD-10-CM | POA: Diagnosis not present

## 2023-05-05 DIAGNOSIS — Z833 Family history of diabetes mellitus: Secondary | ICD-10-CM | POA: Diagnosis not present

## 2023-05-05 DIAGNOSIS — E66811 Obesity, class 1: Secondary | ICD-10-CM | POA: Diagnosis present

## 2023-05-05 DIAGNOSIS — E785 Hyperlipidemia, unspecified: Secondary | ICD-10-CM | POA: Diagnosis present

## 2023-05-05 DIAGNOSIS — I340 Nonrheumatic mitral (valve) insufficiency: Secondary | ICD-10-CM | POA: Diagnosis not present

## 2023-05-05 DIAGNOSIS — Z951 Presence of aortocoronary bypass graft: Secondary | ICD-10-CM | POA: Diagnosis not present

## 2023-05-05 DIAGNOSIS — G4730 Sleep apnea, unspecified: Secondary | ICD-10-CM | POA: Diagnosis present

## 2023-05-05 DIAGNOSIS — E119 Type 2 diabetes mellitus without complications: Secondary | ICD-10-CM

## 2023-05-05 DIAGNOSIS — I5033 Acute on chronic diastolic (congestive) heart failure: Secondary | ICD-10-CM | POA: Diagnosis present

## 2023-05-05 DIAGNOSIS — I130 Hypertensive heart and chronic kidney disease with heart failure and stage 1 through stage 4 chronic kidney disease, or unspecified chronic kidney disease: Secondary | ICD-10-CM | POA: Diagnosis not present

## 2023-05-05 DIAGNOSIS — Z7984 Long term (current) use of oral hypoglycemic drugs: Secondary | ICD-10-CM | POA: Diagnosis not present

## 2023-05-05 DIAGNOSIS — Z87891 Personal history of nicotine dependence: Secondary | ICD-10-CM

## 2023-05-05 DIAGNOSIS — Z7982 Long term (current) use of aspirin: Secondary | ICD-10-CM

## 2023-05-05 DIAGNOSIS — I10 Essential (primary) hypertension: Secondary | ICD-10-CM | POA: Diagnosis not present

## 2023-05-05 DIAGNOSIS — R079 Chest pain, unspecified: Secondary | ICD-10-CM | POA: Diagnosis not present

## 2023-05-05 DIAGNOSIS — I513 Intracardiac thrombosis, not elsewhere classified: Secondary | ICD-10-CM | POA: Diagnosis present

## 2023-05-05 DIAGNOSIS — I110 Hypertensive heart disease with heart failure: Secondary | ICD-10-CM | POA: Diagnosis not present

## 2023-05-05 DIAGNOSIS — Z952 Presence of prosthetic heart valve: Secondary | ICD-10-CM | POA: Diagnosis not present

## 2023-05-05 DIAGNOSIS — I4891 Unspecified atrial fibrillation: Secondary | ICD-10-CM | POA: Diagnosis not present

## 2023-05-05 DIAGNOSIS — I447 Left bundle-branch block, unspecified: Secondary | ICD-10-CM | POA: Diagnosis not present

## 2023-05-05 DIAGNOSIS — Z818 Family history of other mental and behavioral disorders: Secondary | ICD-10-CM

## 2023-05-05 DIAGNOSIS — R791 Abnormal coagulation profile: Secondary | ICD-10-CM | POA: Diagnosis present

## 2023-05-05 DIAGNOSIS — Z9049 Acquired absence of other specified parts of digestive tract: Secondary | ICD-10-CM

## 2023-05-05 DIAGNOSIS — I2510 Atherosclerotic heart disease of native coronary artery without angina pectoris: Secondary | ICD-10-CM | POA: Diagnosis not present

## 2023-05-05 DIAGNOSIS — I2489 Other forms of acute ischemic heart disease: Secondary | ICD-10-CM

## 2023-05-05 DIAGNOSIS — I5030 Unspecified diastolic (congestive) heart failure: Secondary | ICD-10-CM | POA: Diagnosis not present

## 2023-05-05 DIAGNOSIS — I5A Non-ischemic myocardial injury (non-traumatic): Secondary | ICD-10-CM | POA: Diagnosis not present

## 2023-05-05 DIAGNOSIS — Z91041 Radiographic dye allergy status: Secondary | ICD-10-CM

## 2023-05-05 DIAGNOSIS — Z8249 Family history of ischemic heart disease and other diseases of the circulatory system: Secondary | ICD-10-CM | POA: Diagnosis not present

## 2023-05-05 DIAGNOSIS — I25708 Atherosclerosis of coronary artery bypass graft(s), unspecified, with other forms of angina pectoris: Secondary | ICD-10-CM | POA: Diagnosis present

## 2023-05-05 DIAGNOSIS — I219 Acute myocardial infarction, unspecified: Secondary | ICD-10-CM | POA: Diagnosis not present

## 2023-05-05 DIAGNOSIS — Z95810 Presence of automatic (implantable) cardiac defibrillator: Secondary | ICD-10-CM

## 2023-05-05 DIAGNOSIS — Z811 Family history of alcohol abuse and dependence: Secondary | ICD-10-CM

## 2023-05-05 DIAGNOSIS — Z825 Family history of asthma and other chronic lower respiratory diseases: Secondary | ICD-10-CM

## 2023-05-05 DIAGNOSIS — Z6833 Body mass index (BMI) 33.0-33.9, adult: Secondary | ICD-10-CM

## 2023-05-05 DIAGNOSIS — N183 Chronic kidney disease, stage 3 unspecified: Secondary | ICD-10-CM | POA: Diagnosis present

## 2023-05-05 DIAGNOSIS — I5031 Acute diastolic (congestive) heart failure: Secondary | ICD-10-CM | POA: Diagnosis not present

## 2023-05-05 DIAGNOSIS — K219 Gastro-esophageal reflux disease without esophagitis: Secondary | ICD-10-CM | POA: Diagnosis present

## 2023-05-05 DIAGNOSIS — I517 Cardiomegaly: Secondary | ICD-10-CM | POA: Diagnosis not present

## 2023-05-05 DIAGNOSIS — I4819 Other persistent atrial fibrillation: Principal | ICD-10-CM | POA: Diagnosis present

## 2023-05-05 DIAGNOSIS — Z881 Allergy status to other antibiotic agents status: Secondary | ICD-10-CM

## 2023-05-05 LAB — TROPONIN I (HIGH SENSITIVITY): Troponin I (High Sensitivity): 12 ng/L (ref <18)

## 2023-05-05 LAB — CBC
HCT: 43.3 % (ref 36.0–46.0)
Hemoglobin: 13.6 g/dL (ref 12.0–15.0)
MCH: 25.8 pg — ABNORMAL LOW (ref 26.0–34.0)
MCHC: 31.4 g/dL (ref 30.0–36.0)
MCV: 82 fL (ref 80.0–100.0)
Platelets: 377 10*3/uL (ref 150–400)
RBC: 5.28 MIL/uL — ABNORMAL HIGH (ref 3.87–5.11)
RDW: 16.1 % — ABNORMAL HIGH (ref 11.5–15.5)
WBC: 13.5 10*3/uL — ABNORMAL HIGH (ref 4.0–10.5)
nRBC: 0 % (ref 0.0–0.2)

## 2023-05-05 LAB — MAGNESIUM: Magnesium: 2.2 mg/dL (ref 1.7–2.4)

## 2023-05-05 LAB — BASIC METABOLIC PANEL
Anion gap: 13 (ref 5–15)
BUN: 29 mg/dL — ABNORMAL HIGH (ref 8–23)
CO2: 22 mmol/L (ref 22–32)
Calcium: 9.1 mg/dL (ref 8.9–10.3)
Chloride: 96 mmol/L — ABNORMAL LOW (ref 98–111)
Creatinine, Ser: 1.51 mg/dL — ABNORMAL HIGH (ref 0.44–1.00)
GFR, Estimated: 39 mL/min — ABNORMAL LOW (ref >60)
Glucose, Bld: 194 mg/dL — ABNORMAL HIGH (ref 70–99)
Potassium: 4.2 mmol/L (ref 3.5–5.1)
Sodium: 131 mmol/L — ABNORMAL LOW (ref 135–145)

## 2023-05-05 LAB — PROTIME-INR
INR: 2.3 — ABNORMAL HIGH (ref 0.8–1.2)
Prothrombin Time: 25.9 s — ABNORMAL HIGH (ref 11.4–15.2)

## 2023-05-05 MED ORDER — NITROGLYCERIN 0.4 MG SL SUBL
0.4000 mg | SUBLINGUAL_TABLET | SUBLINGUAL | Status: DC | PRN
  Administered 2023-05-06: 0.4 mg via SUBLINGUAL

## 2023-05-05 MED ORDER — NITROGLYCERIN 0.4 MG SL SUBL
0.4000 mg | SUBLINGUAL_TABLET | Freq: Once | SUBLINGUAL | Status: DC
  Filled 2023-05-05: qty 1

## 2023-05-05 MED ORDER — WARFARIN - PHARMACIST DOSING INPATIENT: Freq: Every day | Status: DC

## 2023-05-05 MED ORDER — WARFARIN SODIUM 7.5 MG PO TABS
7.5000 mg | ORAL_TABLET | Freq: Once | ORAL | Status: AC
  Administered 2023-05-06: 7.5 mg via ORAL
  Filled 2023-05-05: qty 1

## 2023-05-05 NOTE — Telephone Encounter (Addendum)
 Marland Kitchen

## 2023-05-05 NOTE — ED Provider Notes (Signed)
 [place] [facility] AT [facility] Provider Note   CSN: [id number] Arrival date & time: 05/05/23  2205     History {Add pertinent medical, surgical, social history, OB history to HPI:1} Chief Complaint  Patient presents with  . Chest Pain  . Atrial Fibrillation    Kimberly Hobbs is a 62 y.o. female.  63 y/o female with hx of CAD, HTN, PAF on coumadin, rheumatic valvular disease, hx of VT s/p pacemaker/ICD, CKD, DM, hx of mitral valve replacement, mixed COPD, and OSA on CPAP presents to the ED for evaluation of chest pain. Patient reports onset of Afib on Thursday at 4AM. She has been experiencing a central chest pressure since that time which is worse with exertion. Called the Afib clinic on Friday and was told to take 200mg  Amiodarone BID instead of daily. Has been doing this since Friday without change. Also reports someone was supposed to call her back after contacting her [employer] company. Chest pressure worsened at 2000 tonight prompting EMS call. She reports worsening pressure and SOB with exertion. No fevers, syncope, leg swelling, worsening cough   Chest Pain Atrial Fibrillation Associated symptoms include chest pain.       Home Medications Prior to Admission medications   Medication Sig Start Date End Date Taking? Authorizing Provider  Accu-Chek Softclix Lancets lancets Use as instructed Patient taking differently: 1 each by Other route See admin instructions. Use as instructed 05/23/22   Nche, [name], NP  acetaminophen (TYLENOL) 325 MG tablet Take 2 tablets (650 mg total) by mouth every 4 (four) hours as needed for headache or mild pain (pain score 1-3). 02/11/23   [name], PA-C  albuterol (VENTOLIN HFA) 108 (90 Base) MCG/ACT inhaler Inhale 1-2 puffs into the lungs every 6 (six) hours as needed for wheezing or shortness of breath.    [provider]  amiodarone (PACERONE) 200 MG tablet Take 1 tablet (200 mg total) by  mouth 2 (two) times daily. 05/02/23   [name], [name], PA  aspirin EC 81 MG tablet Take 81 mg by mouth daily.     [provider]  Blood Glucose Monitoring Suppl (ONE TOUCH ULTRA 2) w/Device KIT 3 (three) times daily. 03/27/23   [provider]  Budeson-Glycopyrrol-Formoterol (BREZTRI AEROSPHERE) 160-9-4.8 MCG/ACT AERO Inhale 2 puffs into the lungs in the morning and at bedtime. 04/18/23   Cobb, Ruby Cola, NP  clonazePAM (KLONOPIN) 0.5 MG tablet Take 1.5 tablets (0.75 mg total) by mouth at bedtime. 01/23/23   [name], PA  furosemide (LASIX) 20 MG tablet Take 20 mg by mouth 2 (two) times daily.    [provider]  gabapentin (NEURONTIN) 100 MG capsule Take 1 capsule (100 mg total) by mouth at bedtime. 03/26/23   [name], MD  Glucose Blood (BLOOD GLUCOSE TEST STRIPS) STRP 1 each by In Vitro route in the morning, at noon, and at bedtime. May substitute to any manufacturer covered by patient's insurance. 03/26/23   Saralyn Pilar A, PA  guaiFENesin-codeine 100-10 MG/5ML syrup Take 5 mLs by mouth every 6 (six) hours as needed for cough. 03/21/23   [name], MD  isosorbide mononitrate (IMDUR) 60 MG 24 hr tablet Take 1 tablet (60 mg total) by mouth daily. 09/13/22   [name], [name], MD  magnesium oxide (MAG-OX) 400 (240 Mg) MG tablet Take 1 tablet (400 mg total) by mouth daily. 02/12/23   [name], PA-C  metFORMIN (GLUCOPHAGE) 500 MG tablet  Take 1 tablet (500 mg total) by mouth 2 (two) times daily with a meal. 12/23/22   Saralyn Pilar A, PA  metoprolol succinate (TOPROL-XL) 50 MG 24 hr tablet Take 50 mg by mouth 2 (two) times daily. 04/07/23   [provider]  montelukast (SINGULAIR) 10 MG tablet Take 1 tablet (10 mg total) by mouth daily. 05/27/22 05/27/23  Charlott Holler, MD  nitrofurantoin, macrocrystal-monohydrate, (MACROBID) 100 MG capsule Take 100 mg by mouth 2 (two) times daily. 04/03/23   [provider]   oxyCODONE-acetaminophen (PERCOCET) 5-325 MG tablet Take 1 tablet by mouth every 12 (twelve) hours as needed for severe pain (pain score 7-10). 03/20/23   [name], MD  pantoprazole (PROTONIX) 40 MG tablet Take 1 tablet (40 mg total) by mouth 2 (two) times daily. 04/14/23   Saralyn Pilar A, PA  potassium chloride SA (KLOR-CON M) 20 MEQ tablet Take 1 tablet (20 mEq total) by mouth daily. Take 2 tablets once at dinner 03/26/23 and then 1 tablet daily starting 03/27/23 03/27/23   [name], MD  predniSONE (DELTASONE) 10 MG tablet 1 tab for 5 days then 1/2 tab for 5 days then stop 04/18/23   Cobb, Ruby Cola, NP  ramelteon (ROZEREM) 8 MG tablet Take 1 tablet (8 mg total) by mouth at bedtime. 04/14/23 06/13/23  Neysa Hotter, MD  rosuvastatin (CRESTOR) 40 MG tablet TAKE 1 TABLET(40 MG) BY MOUTH DAILY 03/04/23   [name], Manish J, MD  sacubitril-valsartan (ENTRESTO) 24-26 MG Take 1 tablet by mouth 2 (two) times daily. 02/11/23   [name], PA-C  sertraline (ZOLOFT) 50 MG tablet 25 mg at night for one week, then 50 mg at night 04/14/23   Neysa Hotter, MD  warfarin (COUMADIN) 5 MG tablet Take 1 tablet (5 mg total) by mouth 4 (four) times a week. Sat, Tues, Thurs, and Sun 03/27/23   [name], MD  warfarin (COUMADIN) 7.5 MG tablet Take 7.5 mg by mouth daily.    [provider]      Allergies    Duloxetine hcl, Iodinated contrast media, Penicillins, and Doxycycline    Review of Systems   Review of Systems  Cardiovascular:  Positive for chest pain.  Ten systems reviewed and are negative for acute change, except as noted in the HPI.    Physical Exam Updated Vital Signs BP 138/88   Pulse (!) 108   Temp 97.7 F (36.5 C)   Resp 17   Wt 93 kg   LMP 06/20/2015   SpO2 95%   BMI 34.11 kg/m   Physical Exam Vitals and nursing note reviewed.  Constitutional:      General: She is not in acute distress.    Appearance: She is well-developed. She is not diaphoretic.      Comments: Nontoxic appearing  HENT:     Head: Normocephalic and atraumatic.  Eyes:     General: No scleral icterus.    Conjunctiva/sclera: Conjunctivae normal.  Cardiovascular:     Rate and Rhythm: Tachycardia present. Rhythm irregular.     Pulses: Normal pulses.     Comments: Afib on monitor. Rate 98-105bpm. Pulmonary:     Effort: Pulmonary effort is normal. No respiratory distress.     Comments: Respirations even and unlabored Musculoskeletal:        General: Normal range of motion.     Cervical back: Normal range of motion.     Comments: No BLE edema  Skin:    General: Skin is  warm and dry.     Coloration: Skin is not pale.     Findings: No erythema or rash.  Neurological:     Mental Status: She is alert and oriented to person, place, and time.     Coordination: Coordination normal.  Psychiatric:        Behavior: Behavior normal.    ED Results / Procedures / Treatments   Labs (all labs ordered are listed, but only abnormal results are displayed) Labs Reviewed  BASIC METABOLIC PANEL  MAGNESIUM  CBC  PROTIME-INR    EKG None  Radiology DG Chest Port 1 View Result Date: 05/05/2023 CLINICAL DATA:  Chest pain, AFib EXAM: PORTABLE CHEST 1 VIEW COMPARISON:  04/16/2023 FINDINGS: Stable cardiomegaly. Left chest wall ICD. AVR. Sternotomy and CABG. Aortic atherosclerotic calcification. Diffuse interstitial coarsening compatible with edema. No focal consolidation, pleural effusion, or pneumothorax. IMPRESSION: Cardiomegaly with interstitial edema. Electronically Signed   By: Minerva Fester M.D.   On: 05/05/2023 22:54    Procedures Procedures  {Document cardiac monitor, telemetry assessment procedure when appropriate:1}  Medications Ordered in ED Medications  warfarin (COUMADIN) tablet 5 mg (has no administration in time range)    ED Course/ Medical Decision Making/ A&P Clinical Course as of 05/05/23 2321  Mon May 05, 2023  2241 Echocardiogram from December 2024  notable for LVEF of 55-60%. [KH]  2307 Afib. Told  [CC]    Clinical Course User Index [CC] Glyn Ade, MD [KH] Antony Madura, PA-C   {   Click here for ABCD2, HEART and other calculatorsREFRESH Note before signing :1}                              Medical Decision Making Amount and/or Complexity of Data Reviewed Labs: ordered. Radiology: ordered.   ***  {Document critical care time when appropriate:1} {Document review of labs and clinical decision tools ie heart score, Chads2Vasc2 etc:1}  {Document your independent review of radiology images, and any outside records:1} {Document your discussion with family members, caretakers, and with consultants:1} {Document social determinants of health affecting pt's care:1} {Document your decision making why or why not admission, treatments were needed:1} Final Clinical Impression(s) / ED Diagnoses Final diagnoses:  None    Rx / DC Orders ED Discharge Orders          Ordered    Amb referral to AFIB Clinic        05/05/23 2237

## 2023-05-05 NOTE — Telephone Encounter (Signed)
 Pt called back and was advised to send a transmission and a nurse will call her back

## 2023-05-05 NOTE — H&P (Incomplete)
 Cardiology Admission History and Physical   Patient ID: Kimberly Hobbs MRN: [id number]; DOB: [birth year]   Admission date: 05/05/2023  PCP:  [name], PA   [facility] Providers Cardiologist:  Thomasene Ripple, DO  Electrophysiologist:  [name], MD  Sleep Medicine:  Armanda Magic, MD  { Click here to update MD or APP on Care Team, Refresh:1}     Chief Complaint:  ***  Patient Profile:   Kimberly Hobbs is a 63 y.o. female with *** who is being seen 05/05/2023 for the evaluation of ***.  History of Present Illness:   Kimberly Hobbs ***   Past Medical History:  Diagnosis Date   Allergy ?   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 01/01/2017 01/01/2017    Past Surgical History:  Procedure Laterality Date   ATRIAL FIBRILLATION ABLATION N/A 09/05/2022   Procedure: ATRIAL FIBRILLATION ABLATION;  Surgeon: Regan Lemming, MD;  Location: [facility] INVASIVE CV LAB;  Service: Cardiovascular;  Laterality: N/A;   CARDIAC CATHETERIZATION     CARDIAC VALVE REPLACEMENT     CARDIOVERSION N/A 04/02/2022   Procedure: CARDIOVERSION;  Surgeon: Elder Negus, MD;  Location: [facility] ENDOSCOPY;  Service: Cardiovascular;  Laterality: N/A;   CARDIOVERSION N/A 04/27/2022   Procedure: CARDIOVERSION;  Surgeon: Elder Negus, MD;  Location: [facility] ENDOSCOPY;  Service: Cardiovascular;  Laterality: N/A;   CARDIOVERSION N/A 03/27/2023   Procedure:  CARDIOVERSION;  Surgeon: Maisie Fus, MD;  Location: [facility] INVASIVE CV LAB;  Service: Cardiovascular;  Laterality: N/A;   CHOLECYSTECTOMY     CORONARY ARTERY BYPASS GRAFT     CORONARY/GRAFT ANGIOGRAPHY N/A 08/18/2018   Procedure: CORONARY/GRAFT ANGIOGRAPHY;  Surgeon: Elder Negus, MD;  Location: [facility] INVASIVE CV LAB;  Service: Cardiovascular;  Laterality: N/A;   ELECTROPHYSIOLOGY STUDY N/A 02/04/2023   Procedure: ELECTROPHYSIOLOGY STUDY;  Surgeon: Regan Lemming, MD;  Location: [facility] INVASIVE CV LAB;  Service: Cardiovascular;  Laterality: N/A;   ICD IMPLANT N/A 02/10/2023   Procedure: ICD IMPLANT;  Surgeon: Regan Lemming, MD;  Location: [facility] INVASIVE CV LAB;  Service: Cardiovascular;  Laterality: N/A;   LEG SURGERY     "metal plate in leg"   RIGHT HEART CATH AND CORONARY/GRAFT ANGIOGRAPHY N/A 07/21/2018   Procedure: RIGHT HEART CATH AND CORONARY/GRAFT ANGIOGRAPHY;  Surgeon: Elder Negus, MD;  Location: [facility] INVASIVE CV LAB;  Service: Cardiovascular;  Laterality: N/A;   TEE WITHOUT CARDIOVERSION N/A 07/27/2015   Procedure: TRANSESOPHAGEAL ECHOCARDIOGRAM (TEE);  Surgeon: Thurmon Fair, MD;  Location: [facility] ENDOSCOPY;  Service: Cardiovascular;  Laterality: N/A;   TEE WITHOUT CARDIOVERSION N/A 07/21/2018   Procedure: TRANSESOPHAGEAL ECHOCARDIOGRAM (TEE);  Surgeon: Elder Negus, MD;  Location: [facility] ENDOSCOPY;  Service: Cardiovascular;  Laterality: N/A;   TONSILLECTOMY AND ADENOIDECTOMY       Medications Prior to Admission: Prior to Admission medications   Medication Sig Start Date End Date Taking?  Authorizing Provider  Accu-Chek Softclix Lancets lancets Use as instructed Patient taking differently: 1 each by Other route See admin instructions. Use as instructed 05/23/22   Nche, [name], NP  acetaminophen (TYLENOL) 325 MG tablet Take 2 tablets (650 mg total) by mouth every 4 (four) hours as needed for headache or mild pain (pain score 1-3). 02/11/23   [name], PA-C  albuterol (VENTOLIN HFA) 108 (90 Base) MCG/ACT inhaler Inhale 1-2 puffs into the lungs every 6 (six) hours as needed for wheezing or shortness of breath.    [provider]  amiodarone (PACERONE) 200 MG tablet Take 1 tablet (200 mg total) by mouth 2 (two) times daily. 05/02/23   [name], [name], PA  aspirin EC 81 MG tablet Take 81 mg by mouth daily.     [provider]  Blood Glucose Monitoring Suppl (ONE TOUCH ULTRA 2) w/Device KIT 3 (three) times daily. 03/27/23   [provider]  Budeson-Glycopyrrol-Formoterol (BREZTRI AEROSPHERE) 160-9-4.8 MCG/ACT AERO Inhale 2 puffs into the lungs in the morning and at bedtime. 04/18/23   Cobb, Ruby Cola, NP  clonazePAM (KLONOPIN) 0.5 MG tablet Take 1.5 tablets (0.75 mg total) by mouth at bedtime. 01/23/23   [name], PA  furosemide (LASIX) 20 MG tablet Take 20 mg by mouth 2 (two) times daily.    [provider]  gabapentin (NEURONTIN) 100 MG capsule Take 1 capsule (100 mg total) by mouth at bedtime. 03/26/23   [name], MD  Glucose Blood (BLOOD GLUCOSE TEST STRIPS) STRP 1 each by In Vitro route in the morning, at noon, and at bedtime. May substitute to any manufacturer covered by patient's insurance. 03/26/23   Saralyn Pilar A, PA  guaiFENesin-codeine 100-10 MG/5ML syrup Take 5 mLs by mouth every 6 (six) hours as needed for cough. 03/21/23   [name], MD  isosorbide mononitrate (IMDUR) 60 MG 24 hr tablet Take 1 tablet (60 mg total) by mouth daily. 09/13/22   [name], [name], MD  magnesium oxide (MAG-OX) 400 (240 Mg) MG tablet Take 1 tablet (400 mg total) by mouth daily. 02/12/23   [name], PA-C  metFORMIN (GLUCOPHAGE) 500 MG tablet Take 1 tablet (500 mg total) by mouth 2 (two) times daily with a meal. 12/23/22   Saralyn Pilar A, PA  metoprolol succinate (TOPROL-XL) 50 MG 24 hr tablet Take 50 mg by mouth 2 (two) times daily. 04/07/23   [provider]  montelukast  (SINGULAIR) 10 MG tablet Take 1 tablet (10 mg total) by mouth daily. 05/27/22 05/27/23  Charlott Holler, MD  nitrofurantoin, macrocrystal-monohydrate, (MACROBID) 100 MG capsule Take 100 mg by mouth 2 (two) times daily. 04/03/23   [provider]  oxyCODONE-acetaminophen (PERCOCET) 5-325 MG tablet Take 1 tablet by mouth every 12 (twelve) hours as needed for severe pain (pain score 7-10). 03/20/23   [name], MD  pantoprazole (PROTONIX) 40 MG tablet Take 1 tablet (40 mg total) by mouth 2 (two) times daily. 04/14/23   Saralyn Pilar A, PA  potassium chloride SA (KLOR-CON M) 20 MEQ tablet Take 1 tablet (20 mEq total) by mouth daily. Take 2 tablets once at dinner 03/26/23 and then 1 tablet daily starting 03/27/23 03/27/23   [name], MD  predniSONE (DELTASONE) 10 MG tablet 1 tab for 5 days then 1/2 tab for 5 days then stop 04/18/23   Cobb, Ruby Cola, NP  ramelteon (ROZEREM) 8 MG tablet Take 1 tablet (8 mg total) by  mouth at bedtime. 04/14/23 06/13/23  Neysa Hotter, MD  rosuvastatin (CRESTOR) 40 MG tablet TAKE 1 TABLET(40 MG) BY MOUTH DAILY 03/04/23   [name], Manish J, MD  sacubitril-valsartan (ENTRESTO) 24-26 MG Take 1 tablet by mouth 2 (two) times daily. 02/11/23   [name], PA-C  sertraline (ZOLOFT) 50 MG tablet 25 mg at night for one week, then 50 mg at night 04/14/23   Neysa Hotter, MD  warfarin (COUMADIN) 5 MG tablet Take 1 tablet (5 mg total) by mouth 4 (four) times a week. Sat, Tues, Thurs, and Sun 03/27/23   [name], MD  warfarin (COUMADIN) 7.5 MG tablet Take 7.5 mg by mouth daily.    [provider]     Allergies:    Allergies  Allergen Reactions   Duloxetine Hcl Other (See Comments)    Behavioral changes, sleep disturbances   Iodinated Contrast Media Other (See Comments)    Patient is unsure of reaction type   Penicillins Other (See Comments)    Immune to drug , does not work per patient    Doxycycline Nausea And Vomiting    Social  History:   Social History   Socioeconomic History   Marital status: Divorced    Spouse name: Not on file   Number of children: 4   Years of education: Not on file   Highest education level: GED or equivalent  Occupational History   Not on file  Tobacco Use   Smoking status: Former    Current packs/day: 0.00    Average packs/day: 1.6 packs/day for 50.0 years (80.0 ttl pk-yrs)    Types: Cigarettes    Start date: 12/23/1976    Quit date: 12/23/2016    Years since quitting: 6.3    Passive exposure: Past   Smokeless tobacco: Never   Tobacco comments:    Former smoker 01/24/23  Vaping Use   Vaping status: Never Used  Substance and Sexual Activity   Alcohol use: Never   Drug use: Yes    Types: Hydrocodone   Sexual activity: Not Currently  Other Topics Concern   Not on file  Social History Narrative   ** Merged History Encounter **       Epworth Sleepiness Scale = 4 (as of 06/28/2015)   Social Drivers of Health   Financial Resource Strain: Low Risk  (10/14/2022)   Overall Financial Resource Strain (CARDIA)    Difficulty of Paying Living Expenses: Not hard at all  Food Insecurity: No Food Insecurity (02/14/2023)   Hunger Vital Sign    Worried About Running Out of Food in the Last Year: Never true    Ran Out of Food in the Last Year: Never true  Transportation Needs: No Transportation Needs (02/14/2023)   PRAPARE - Administrator, Civil Service (Medical): No    Lack of Transportation (Non-Medical): No  Physical Activity: Inactive (10/14/2022)   Exercise Vital Sign    Days of Exercise per Week: 0 days    Minutes of Exercise per Session: 0 min  Stress: Stress Concern Present (10/14/2022)   [employer] of Occupational Health - Occupational Stress Questionnaire    Feeling of Stress : To some extent  Social Connections: Moderately Isolated (10/14/2022)   Social Connection and Isolation Panel [NHANES]    Frequency of Communication with Friends and Family: More  than three times a week    Frequency of Social Gatherings with Friends and Family: Never    Attends Religious Services: Never  Active Member of Clubs or Organizations: No    Attends Banker Meetings: Never    Marital Status: Living with partner  Intimate Partner Violence: Not At Risk (02/14/2023)   Humiliation, Afraid, Rape, and Kick questionnaire    Fear of Current or Ex-Partner: No    Emotionally Abused: No    Physically Abused: No    Sexually Abused: No    Family History:  *** The patient's family history includes Alcohol abuse in her father and mother; Anxiety disorder in her father and mother; COPD in her daughter; Cancer in her daughter, father, and mother; Diabetes in her father and mother; Drug abuse in her father and mother; Heart disease in her father and mother; Hyperlipidemia in her mother; Hypertension in her brother, brother, father, mother, and sister; Schizophrenia in her maternal grandmother. There is no history of Breast cancer.    ROS:  Please see the history of present illness.  ***All other ROS reviewed and negative.     Physical Exam/Data:   Vitals:   05/05/23 2230 05/05/23 2245 05/05/23 2300 05/05/23 2315  BP: (!) 140/92 138/88 125/82 119/78  Pulse: 93 (!) 108 (!) 101 100  Resp: (!) 30 17 19  (!) 23  Temp:      SpO2: 94% 95% 97% 93%  Weight:       No intake or output data in the 24 hours ending 05/05/23 2357    05/05/2023   10:24 PM 04/18/2023    8:57 AM 04/17/2023   10:52 AM  Last 3 Weights  Weight (lbs) 205 lb 205 lb 203 lb  Weight (kg) 92.987 kg 92.987 kg 92.08 kg     Body mass index is 34.11 kg/m.  General:  Well nourished, well developed, in no acute distress*** HEENT: normal Neck: no*** JVD Vascular: No carotid bruits; Distal pulses 2+ bilaterally   Cardiac:  normal S1, S2; RRR; no murmur *** Lungs:  clear to auscultation bilaterally, no wheezing, rhonchi or rales  Abd: soft, nontender, no hepatomegaly  Ext: no***  edema Musculoskeletal:  No deformities, BUE and BLE strength normal and equal Skin: warm and dry  Neuro:  CNs 2-12 intact, no focal abnormalities noted Psych:  Normal affect    EKG:  The ECG that was done *** was personally reviewed and demonstrates ***  Relevant CV Studies: ***  Laboratory Data:  High Sensitivity Troponin:   Recent Labs  Lab 05/05/23 2250  TROPONINIHS 12      Chemistry Recent Labs  Lab 05/05/23 2250  NA 131*  K 4.2  CL 96*  CO2 22  GLUCOSE 194*  BUN 29*  CREATININE 1.51*  CALCIUM 9.1  MG 2.2  GFRNONAA 39*  ANIONGAP 13    No results for input(s): "PROT", "ALBUMIN", "AST", "ALT", "ALKPHOS", "BILITOT" in the last 168 hours. Lipids No results for input(s): "CHOL", "TRIG", "HDL", "LABVLDL", "LDLCALC", "CHOLHDL" in the last 168 hours. Hematology Recent Labs  Lab 05/05/23 2250  WBC 13.5*  RBC 5.28*  HGB 13.6  HCT 43.3  MCV 82.0  MCH 25.8*  MCHC 31.4  RDW 16.1*  PLT 377   Thyroid No results for input(s): "TSH", "FREET4" in the last 168 hours. BNPNo results for input(s): "BNP", "PROBNP" in the last 168 hours.  DDimer No results for input(s): "DDIMER" in the last 168 hours.   Radiology/Studies:  DG Chest Port 1 View Result Date: 05/05/2023 CLINICAL DATA:  Chest pain, AFib EXAM: PORTABLE CHEST 1 VIEW COMPARISON:  04/16/2023 FINDINGS: Stable cardiomegaly. Left chest  wall ICD. AVR. Sternotomy and CABG. Aortic atherosclerotic calcification. Diffuse interstitial coarsening compatible with edema. No focal consolidation, pleural effusion, or pneumothorax. IMPRESSION: Cardiomegaly with interstitial edema. Electronically Signed   By: Minerva Fester M.D.   On: 05/05/2023 22:54     Assessment and Plan:   ***   Risk Assessment/Risk Scores:  {Complete the following score calculators/questions to meet required metrics.  Press F2:1}  {Is the patient being seen for unstable angina, ACS, NSTEMI or STEMI?:[id number]} {Does this patient have CHF or CHF  symptoms?      :[id number]} {Does this patient have ATRIAL FIBRILLATION?:[id number]}  Code Status: {Please document patient code status       :29822}  Severity of Illness: {Observation/Inpatient:21159}   For questions or updates, please contact [facility] Please consult [email or web address] for contact info under     Signed, Karl Ito, MD  05/05/2023 11:57 PM

## 2023-05-05 NOTE — Progress Notes (Signed)
 PHARMACY - ANTICOAGULATION CONSULT NOTE  Pharmacy Consult for warfarin Indication: atrial fibrillation, MVR  Allergies  Allergen Reactions   Duloxetine Hcl Other (See Comments)    Behavioral changes, sleep disturbances   Iodinated Contrast Media Other (See Comments)    Patient is unsure of reaction type   Penicillins Other (See Comments)    Immune to drug , does not work per patient    Doxycycline Nausea And Vomiting    Vital Signs: Temp: 97.7 F (36.5 C) (03/17 2229) BP: 119/78 (03/17 2315) Pulse Rate: 100 (03/17 2315)  Labs: Recent Labs    05/05/23 2250  HGB 13.6  HCT 43.3  PLT 377  LABPROT 25.9*  INR 2.3*  CREATININE 1.51*    Estimated Creatinine Clearance: 44.1 mL/min (A) (by C-G formula based on SCr of 1.51 mg/dL (H)).   Medical History: Past Medical History:  Diagnosis Date   Allergy ?   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 year old female presented on 05/05/2023 with concern for chest pain. On warfarin prior to admission for afib and MVR. Prior to admission regimen, 5mg  every day except 7.5 Sunday and Fridays (total weekly dose 40 mg). Last dose prior to admission 3/16 at 2100. Pharmacy consulted for warfarin inpatient management.   Date INR Warfarin Dose  3/17 2.3, subtherapeutic 7.5mg  (planned)     Goal of Therapy:  INR goal: 2.5-3.5 Monitor platelets by  anticoagulation protocol: Yes   Plan:  Warfarin 7.5mg  x 1 tonight F/u daily INR and warfarin dosing Monitor for new drug-drug interactions   Thank you for allowing pharmacy to participate in this patient's care.  Marja Kays, PharmD Emergency Medicine Clinical Pharmacist 05/05/2023,11:52 PM

## 2023-05-05 NOTE — Telephone Encounter (Signed)
 Transmission received.  Pt continues to be out of rhythm. Will forward to Dr. Elberta Fortis and his  nurse to see if there is any other options for Pt treatment.

## 2023-05-05 NOTE — ED Triage Notes (Signed)
 Pt in from home via [facility] with cp and aifb. Pt states onset of L sided cp that began while at rest at 8pm. Hx of afib with ablation, CHF VS and trx per EMS: 160/100 98%RA CBG 192 HR 80-100 afib 324mg  ASA 4mg  Zofran 20G RAC

## 2023-05-05 NOTE — Telephone Encounter (Signed)
 Patient called to inform me that she has been in Afib since Thursday and her heart rates have increased. Reports that her heart rates "jump around" and are currently in the 130s. Further endorses intermittent chest pressure for the last 30 minutes. Also hypertensive with systolics in the 180s. Denies presyncope, syncope or SOB. I instructed the patient to present to her local emergency department for urgent evaluation. She verbalized an understanding and will present to the ED.

## 2023-05-06 ENCOUNTER — Inpatient Hospital Stay (HOSPITAL_COMMUNITY): Admitting: Certified Registered"

## 2023-05-06 ENCOUNTER — Encounter (HOSPITAL_COMMUNITY)
Admission: EM | Disposition: A | Discharge status: Home / Self Care | Payer: Self-pay | Source: Home / Self Care | Attending: Internal Medicine

## 2023-05-06 ENCOUNTER — Encounter (HOSPITAL_COMMUNITY): Payer: Self-pay | Admitting: Cardiology

## 2023-05-06 ENCOUNTER — Inpatient Hospital Stay (HOSPITAL_COMMUNITY)

## 2023-05-06 ENCOUNTER — Other Ambulatory Visit (HOSPITAL_COMMUNITY)

## 2023-05-06 DIAGNOSIS — I2489 Other forms of acute ischemic heart disease: Secondary | ICD-10-CM

## 2023-05-06 DIAGNOSIS — J449 Chronic obstructive pulmonary disease, unspecified: Secondary | ICD-10-CM | POA: Diagnosis present

## 2023-05-06 DIAGNOSIS — I5033 Acute on chronic diastolic (congestive) heart failure: Secondary | ICD-10-CM

## 2023-05-06 DIAGNOSIS — Z833 Family history of diabetes mellitus: Secondary | ICD-10-CM | POA: Diagnosis not present

## 2023-05-06 DIAGNOSIS — Z79899 Other long term (current) drug therapy: Secondary | ICD-10-CM | POA: Diagnosis not present

## 2023-05-06 DIAGNOSIS — E66811 Obesity, class 1: Secondary | ICD-10-CM | POA: Diagnosis present

## 2023-05-06 DIAGNOSIS — E1122 Type 2 diabetes mellitus with diabetic chronic kidney disease: Secondary | ICD-10-CM | POA: Diagnosis present

## 2023-05-06 DIAGNOSIS — I129 Hypertensive chronic kidney disease with stage 1 through stage 4 chronic kidney disease, or unspecified chronic kidney disease: Secondary | ICD-10-CM | POA: Diagnosis not present

## 2023-05-06 DIAGNOSIS — Z952 Presence of prosthetic heart valve: Secondary | ICD-10-CM

## 2023-05-06 DIAGNOSIS — I4819 Other persistent atrial fibrillation: Secondary | ICD-10-CM | POA: Diagnosis present

## 2023-05-06 DIAGNOSIS — I2510 Atherosclerotic heart disease of native coronary artery without angina pectoris: Secondary | ICD-10-CM

## 2023-05-06 DIAGNOSIS — I4891 Unspecified atrial fibrillation: Principal | ICD-10-CM | POA: Diagnosis present

## 2023-05-06 DIAGNOSIS — E785 Hyperlipidemia, unspecified: Secondary | ICD-10-CM | POA: Diagnosis present

## 2023-05-06 DIAGNOSIS — G4733 Obstructive sleep apnea (adult) (pediatric): Secondary | ICD-10-CM | POA: Diagnosis present

## 2023-05-06 DIAGNOSIS — Z7901 Long term (current) use of anticoagulants: Secondary | ICD-10-CM | POA: Diagnosis not present

## 2023-05-06 DIAGNOSIS — I110 Hypertensive heart disease with heart failure: Secondary | ICD-10-CM

## 2023-05-06 DIAGNOSIS — Z8249 Family history of ischemic heart disease and other diseases of the circulatory system: Secondary | ICD-10-CM | POA: Diagnosis not present

## 2023-05-06 DIAGNOSIS — Z87891 Personal history of nicotine dependence: Secondary | ICD-10-CM | POA: Diagnosis not present

## 2023-05-06 DIAGNOSIS — F419 Anxiety disorder, unspecified: Secondary | ICD-10-CM | POA: Diagnosis present

## 2023-05-06 DIAGNOSIS — I5A Non-ischemic myocardial injury (non-traumatic): Secondary | ICD-10-CM | POA: Diagnosis present

## 2023-05-06 DIAGNOSIS — Z95810 Presence of automatic (implantable) cardiac defibrillator: Secondary | ICD-10-CM | POA: Diagnosis not present

## 2023-05-06 DIAGNOSIS — I340 Nonrheumatic mitral (valve) insufficiency: Secondary | ICD-10-CM | POA: Diagnosis not present

## 2023-05-06 DIAGNOSIS — I447 Left bundle-branch block, unspecified: Secondary | ICD-10-CM | POA: Diagnosis present

## 2023-05-06 DIAGNOSIS — R791 Abnormal coagulation profile: Secondary | ICD-10-CM | POA: Diagnosis present

## 2023-05-06 DIAGNOSIS — N183 Chronic kidney disease, stage 3 unspecified: Secondary | ICD-10-CM | POA: Diagnosis present

## 2023-05-06 DIAGNOSIS — I5031 Acute diastolic (congestive) heart failure: Secondary | ICD-10-CM | POA: Diagnosis present

## 2023-05-06 DIAGNOSIS — I130 Hypertensive heart and chronic kidney disease with heart failure and stage 1 through stage 4 chronic kidney disease, or unspecified chronic kidney disease: Secondary | ICD-10-CM | POA: Diagnosis present

## 2023-05-06 DIAGNOSIS — Z951 Presence of aortocoronary bypass graft: Secondary | ICD-10-CM | POA: Diagnosis not present

## 2023-05-06 DIAGNOSIS — I513 Intracardiac thrombosis, not elsewhere classified: Secondary | ICD-10-CM | POA: Diagnosis present

## 2023-05-06 DIAGNOSIS — Z7984 Long term (current) use of oral hypoglycemic drugs: Secondary | ICD-10-CM | POA: Diagnosis not present

## 2023-05-06 HISTORY — PX: TRANSESOPHAGEAL ECHOCARDIOGRAM (CATH LAB): EP1270

## 2023-05-06 HISTORY — PX: CARDIOVERSION: EP1203

## 2023-05-06 LAB — HEPATIC FUNCTION PANEL
ALT: 56 U/L — ABNORMAL HIGH (ref 0–44)
AST: 38 U/L (ref 15–41)
Albumin: 4.3 g/dL (ref 3.5–5.0)
Alkaline Phosphatase: 89 U/L (ref 38–126)
Bilirubin, Direct: <0.1 mg/dL (ref 0.0–0.2)
Total Bilirubin: 0.7 mg/dL (ref 0.0–1.2)
Total Protein: 7.9 g/dL (ref 6.5–8.1)

## 2023-05-06 LAB — BASIC METABOLIC PANEL
Anion gap: 15 (ref 5–15)
BUN: 28 mg/dL — ABNORMAL HIGH (ref 8–23)
CO2: 21 mmol/L — ABNORMAL LOW (ref 22–32)
Calcium: 9.2 mg/dL (ref 8.9–10.3)
Chloride: 100 mmol/L (ref 98–111)
Creatinine, Ser: 1.5 mg/dL — ABNORMAL HIGH (ref 0.44–1.00)
GFR, Estimated: 39 mL/min — ABNORMAL LOW (ref >60)
Glucose, Bld: 102 mg/dL — ABNORMAL HIGH (ref 70–99)
Potassium: 3.7 mmol/L (ref 3.5–5.1)
Sodium: 136 mmol/L (ref 135–145)

## 2023-05-06 LAB — CBC WITH DIFFERENTIAL/PLATELET
Abs Immature Granulocytes: 0.06 10*3/uL (ref 0.00–0.07)
Basophils Absolute: 0.1 10*3/uL (ref 0.0–0.1)
Basophils Relative: 1 %
Eosinophils Absolute: 0.1 10*3/uL (ref 0.0–0.5)
Eosinophils Relative: 1 %
HCT: 41.4 % (ref 36.0–46.0)
Hemoglobin: 13 g/dL (ref 12.0–15.0)
Immature Granulocytes: 0 %
Lymphocytes Relative: 20 %
Lymphs Abs: 2.9 10*3/uL (ref 0.7–4.0)
MCH: 25.4 pg — ABNORMAL LOW (ref 26.0–34.0)
MCHC: 31.4 g/dL (ref 30.0–36.0)
MCV: 80.9 fL (ref 80.0–100.0)
Monocytes Absolute: 0.9 10*3/uL (ref 0.1–1.0)
Monocytes Relative: 6 %
Neutro Abs: 10.2 10*3/uL — ABNORMAL HIGH (ref 1.7–7.7)
Neutrophils Relative %: 72 %
Platelets: 360 10*3/uL (ref 150–400)
RBC: 5.12 MIL/uL — ABNORMAL HIGH (ref 3.87–5.11)
RDW: 16 % — ABNORMAL HIGH (ref 11.5–15.5)
WBC: 14.2 10*3/uL — ABNORMAL HIGH (ref 4.0–10.5)
nRBC: 0 % (ref 0.0–0.2)

## 2023-05-06 LAB — ABO/RH: ABO/RH(D): O POS

## 2023-05-06 LAB — HEMOGLOBIN A1C
Hgb A1c MFr Bld: 6 % — ABNORMAL HIGH (ref 4.8–5.6)
Mean Plasma Glucose: 125.5 mg/dL

## 2023-05-06 LAB — TSH: TSH: 9.34 u[IU]/mL — ABNORMAL HIGH (ref 0.350–4.500)

## 2023-05-06 LAB — MRSA NEXT GEN BY PCR, NASAL: MRSA by PCR Next Gen: NOT DETECTED

## 2023-05-06 LAB — CBG MONITORING, ED
Glucose-Capillary: 99 mg/dL (ref 70–99)
Glucose-Capillary: 101 mg/dL — ABNORMAL HIGH (ref 70–99)
Glucose-Capillary: 101 mg/dL — ABNORMAL HIGH (ref 70–99)

## 2023-05-06 LAB — APTT: aPTT: 43 s — ABNORMAL HIGH (ref 24–36)

## 2023-05-06 LAB — PROTIME-INR
INR: 2.4 — ABNORMAL HIGH (ref 0.8–1.2)
Prothrombin Time: 26.3 s — ABNORMAL HIGH (ref 11.4–15.2)

## 2023-05-06 LAB — TYPE AND SCREEN
ABO/RH(D): O POS
Antibody Screen: NEGATIVE

## 2023-05-06 LAB — GLUCOSE, CAPILLARY
Glucose-Capillary: 158 mg/dL — ABNORMAL HIGH (ref 70–99)
Glucose-Capillary: 104 mg/dL — ABNORMAL HIGH (ref 70–99)

## 2023-05-06 LAB — HEPARIN LEVEL (UNFRACTIONATED)
Heparin Unfractionated: 0.29 [IU]/mL — ABNORMAL LOW (ref 0.30–0.70)
Heparin Unfractionated: 0.26 [IU]/mL — ABNORMAL LOW (ref 0.30–0.70)

## 2023-05-06 LAB — TROPONIN I (HIGH SENSITIVITY)
Troponin I (High Sensitivity): 21 ng/L — ABNORMAL HIGH
Troponin I (High Sensitivity): 31 ng/L — ABNORMAL HIGH (ref <18)

## 2023-05-06 LAB — BRAIN NATRIURETIC PEPTIDE: B Natriuretic Peptide: 101.7 pg/mL — ABNORMAL HIGH (ref 0.0–100.0)

## 2023-05-06 LAB — ECHO TEE

## 2023-05-06 LAB — HIV ANTIBODY (ROUTINE TESTING W REFLEX): HIV Screen 4th Generation wRfx: NONREACTIVE

## 2023-05-06 MED ORDER — FLUTICASONE FUROATE-VILANTEROL 200-25 MCG/ACT IN AEPB
1.0000 | INHALATION_SPRAY | Freq: Every day | RESPIRATORY_TRACT | Status: DC
  Administered 2023-05-06 – 2023-05-09 (×2): 1 via RESPIRATORY_TRACT
  Filled 2023-05-06 (×2): qty 28

## 2023-05-06 MED ORDER — AMIODARONE HCL 200 MG PO TABS
200.0000 mg | ORAL_TABLET | Freq: Two times a day (BID) | ORAL | Status: DC
Start: 2023-05-06 — End: 2023-05-09
  Administered 2023-05-06 – 2023-05-09 (×7): 200 mg via ORAL
  Filled 2023-05-06 (×7): qty 1

## 2023-05-06 MED ORDER — ASPIRIN 81 MG PO TBEC: 81.0000 mg | DELAYED_RELEASE_TABLET | Freq: Every day | ORAL | Status: DC

## 2023-05-06 MED ORDER — WARFARIN SODIUM 5 MG PO TABS: 5.0000 mg | ORAL_TABLET | ORAL | Status: DC

## 2023-05-06 MED ORDER — ONDANSETRON HCL 4 MG/2ML IJ SOLN
4.0000 mg | Freq: Four times a day (QID) | INTRAMUSCULAR | Status: DC | PRN
  Administered 2023-05-07: 4 mg via INTRAVENOUS
  Filled 2023-05-06: qty 2

## 2023-05-06 MED ORDER — WARFARIN SODIUM 7.5 MG PO TABS
7.5000 mg | ORAL_TABLET | ORAL | Status: DC
Start: 2023-05-08 — End: 2023-05-06

## 2023-05-06 MED ORDER — VASOPRESSIN 20 UNIT/ML IV SOLN
INTRAVENOUS | Status: DC | PRN
  Administered 2023-05-06: .5 [IU] via INTRAVENOUS

## 2023-05-06 MED ORDER — PHENYLEPHRINE HCL (PRESSORS) 10 MG/ML IV SOLN
INTRAVENOUS | Status: DC | PRN
  Administered 2023-05-06 (×5): 160 ug via INTRAVENOUS

## 2023-05-06 MED ORDER — ISOSORBIDE MONONITRATE ER 60 MG PO TB24
60.0000 mg | ORAL_TABLET | Freq: Every day | ORAL | Status: DC
  Administered 2023-05-06 – 2023-05-09 (×4): 60 mg via ORAL
  Filled 2023-05-06 (×2): qty 1
  Filled 2023-05-06: qty 2
  Filled 2023-05-06: qty 1

## 2023-05-06 MED ORDER — GABAPENTIN 100 MG PO CAPS
100.0000 mg | ORAL_CAPSULE | Freq: Every day | ORAL | Status: DC
  Administered 2023-05-06 – 2023-05-08 (×4): 100 mg via ORAL
  Filled 2023-05-06 (×4): qty 1

## 2023-05-06 MED ORDER — SACUBITRIL-VALSARTAN 24-26 MG PO TABS
1.0000 | ORAL_TABLET | Freq: Two times a day (BID) | ORAL | Status: DC
  Administered 2023-05-06 – 2023-05-09 (×7): 1 via ORAL
  Filled 2023-05-06 (×7): qty 1

## 2023-05-06 MED ORDER — BUDESON-GLYCOPYRROL-FORMOTEROL 160-9-4.8 MCG/ACT IN AERO: 2.0000 | INHALATION_SPRAY | Freq: Two times a day (BID) | RESPIRATORY_TRACT | Status: DC

## 2023-05-06 MED ORDER — SERTRALINE HCL 25 MG PO TABS
50.0000 mg | ORAL_TABLET | Freq: Every day | ORAL | Status: DC
  Administered 2023-05-06 – 2023-05-08 (×3): 50 mg via ORAL
  Filled 2023-05-06 (×3): qty 2

## 2023-05-06 MED ORDER — HEPARIN BOLUS VIA INFUSION
4000.0000 [IU] | Freq: Once | INTRAVENOUS | Status: AC
  Administered 2023-05-06: 4000 [IU] via INTRAVENOUS
  Filled 2023-05-06: qty 4000

## 2023-05-06 MED ORDER — ASPIRIN 81 MG PO TBEC
81.0000 mg | DELAYED_RELEASE_TABLET | Freq: Every day | ORAL | Status: DC
  Administered 2023-05-07 – 2023-05-09 (×3): 81 mg via ORAL
  Filled 2023-05-06 (×3): qty 1

## 2023-05-06 MED ORDER — METOPROLOL SUCCINATE ER 25 MG PO TB24: 50.0000 mg | ORAL_TABLET | Freq: Two times a day (BID) | ORAL | Status: DC

## 2023-05-06 MED ORDER — INSULIN ASPART 100 UNIT/ML IJ SOLN
0.0000 [IU] | INTRAMUSCULAR | Status: DC
  Administered 2023-05-07 – 2023-05-08 (×2): 1 [IU] via SUBCUTANEOUS

## 2023-05-06 MED ORDER — LIDOCAINE 2% (20 MG/ML) 5 ML SYRINGE
INTRAMUSCULAR | Status: DC | PRN
Start: 2023-05-06 — End: 2023-05-06
  Administered 2023-05-06: 40 mg via INTRAVENOUS

## 2023-05-06 MED ORDER — EPHEDRINE SULFATE-NACL 50-0.9 MG/10ML-% IV SOSY
PREFILLED_SYRINGE | INTRAVENOUS | Status: DC | PRN
Start: 2023-05-06 — End: 2023-05-06
  Administered 2023-05-06 (×2): 5 mg via INTRAVENOUS

## 2023-05-06 MED ORDER — PANTOPRAZOLE SODIUM 40 MG PO TBEC
40.0000 mg | DELAYED_RELEASE_TABLET | Freq: Two times a day (BID) | ORAL | Status: DC
  Administered 2023-05-06 – 2023-05-09 (×8): 40 mg via ORAL
  Filled 2023-05-06 (×8): qty 1

## 2023-05-06 MED ORDER — RAMELTEON 8 MG PO TABS
8.0000 mg | ORAL_TABLET | Freq: Every day | ORAL | Status: DC
  Filled 2023-05-06 (×4): qty 1

## 2023-05-06 MED ORDER — CLONAZEPAM 0.5 MG PO TABS
0.5000 mg | ORAL_TABLET | Freq: Every day | ORAL | Status: DC
Start: 2023-05-06 — End: 2023-05-09
  Administered 2023-05-06 – 2023-05-08 (×4): 0.5 mg via ORAL
  Filled 2023-05-06 (×4): qty 1

## 2023-05-06 MED ORDER — WARFARIN SODIUM 7.5 MG PO TABS: 7.5000 mg | ORAL_TABLET | Freq: Once | ORAL | Status: DC

## 2023-05-06 MED ORDER — UMECLIDINIUM BROMIDE 62.5 MCG/ACT IN AEPB
1.0000 | INHALATION_SPRAY | Freq: Every day | RESPIRATORY_TRACT | Status: DC
  Administered 2023-05-06 – 2023-05-09 (×2): 1 via RESPIRATORY_TRACT
  Filled 2023-05-06 (×2): qty 7

## 2023-05-06 MED ORDER — ACETAMINOPHEN 325 MG PO TABS
650.0000 mg | ORAL_TABLET | ORAL | Status: DC | PRN
  Administered 2023-05-06: 650 mg via ORAL
  Filled 2023-05-06: qty 2

## 2023-05-06 MED ORDER — FUROSEMIDE 10 MG/ML IJ SOLN
60.0000 mg | Freq: Once | INTRAMUSCULAR | Status: AC
  Administered 2023-05-06: 60 mg via INTRAVENOUS
  Filled 2023-05-06: qty 6

## 2023-05-06 MED ORDER — ASPIRIN 325 MG PO TABS
325.0000 mg | ORAL_TABLET | Freq: Once | ORAL | Status: DC
Start: 2023-05-06 — End: 2023-05-09

## 2023-05-06 MED ORDER — SODIUM CHLORIDE 0.9 % IV SOLN: INTRAVENOUS | Status: DC

## 2023-05-06 MED ORDER — MONTELUKAST SODIUM 10 MG PO TABS
10.0000 mg | ORAL_TABLET | Freq: Every day | ORAL | Status: DC
  Administered 2023-05-06 – 2023-05-09 (×4): 10 mg via ORAL
  Filled 2023-05-06 (×4): qty 1

## 2023-05-06 MED ORDER — PROPOFOL 10 MG/ML IV BOLUS
INTRAVENOUS | Status: DC | PRN
  Administered 2023-05-06: 20 mg via INTRAVENOUS
  Administered 2023-05-06 (×2): 30 mg via INTRAVENOUS
  Administered 2023-05-06 (×2): 20 mg via INTRAVENOUS
  Administered 2023-05-06: 50 mg via INTRAVENOUS
  Administered 2023-05-06: 10 mg via INTRAVENOUS
  Administered 2023-05-06: 100 mg via INTRAVENOUS
  Administered 2023-05-06: 20 mg via INTRAVENOUS

## 2023-05-06 MED ORDER — HEPARIN (PORCINE) 25000 UT/250ML-% IV SOLN
1150.0000 [IU]/h | INTRAVENOUS | Status: DC
  Administered 2023-05-06: 1150 [IU]/h via INTRAVENOUS
  Administered 2023-05-06: 1000 [IU]/h via INTRAVENOUS
  Administered 2023-05-07: 1150 [IU]/h via INTRAVENOUS
  Filled 2023-05-06 (×3): qty 250

## 2023-05-06 MED ORDER — ALBUTEROL SULFATE (2.5 MG/3ML) 0.083% IN NEBU
2.5000 mg | INHALATION_SOLUTION | Freq: Four times a day (QID) | RESPIRATORY_TRACT | Status: DC | PRN
Start: 2023-05-06 — End: 2023-05-09

## 2023-05-06 MED ORDER — ROSUVASTATIN CALCIUM 20 MG PO TABS
40.0000 mg | ORAL_TABLET | Freq: Every day | ORAL | Status: DC
Start: 2023-05-06 — End: 2023-05-09
  Administered 2023-05-06 – 2023-05-09 (×4): 40 mg via ORAL
  Filled 2023-05-06 (×4): qty 2

## 2023-05-06 NOTE — Progress Notes (Signed)
 PHARMACY - ANTICOAGULATION CONSULT NOTE  Pharmacy Consult for warfarin and IV heparin Indication: atrial fibrillation, MVR  Allergies  Allergen Reactions   Duloxetine Hcl Other (See Comments)    Behavioral changes, sleep disturbances   Iodinated Contrast Media Other (See Comments)    Patient is unsure of reaction type   Penicillins Other (See Comments)    Immune to drug , does not work per patient    Doxycycline Nausea And Vomiting    Vital Signs: Temp: 98.2 F (36.8 C) (03/18 0602) Temp Source: Oral (03/18 0602) BP: 93/70 (03/18 0730) Pulse Rate: 101 (03/18 0730) Heparin weight: 77.8 kg  Labs: Recent Labs    05/05/23 2250 05/06/23 0045 05/06/23 0424 05/06/23 1028  HGB 13.6 13.0  --   --   HCT 43.3 41.4  --   --   PLT 377 360  --   --   APTT  --  43*  --   --   LABPROT 25.9* 26.3*  --   --   INR 2.3* 2.4*  --   --   HEPARINUNFRC  --   --   --  0.29*  CREATININE 1.51*  --  1.50*  --   TROPONINIHS 12 21* 31*  --     Estimated Creatinine Clearance: 44.4 mL/min (A) (by C-G formula based on SCr of 1.5 mg/dL (H)).   Medical History: Past Medical History:  Diagnosis Date   Allergy ?   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 year old female presented on 05/05/2023 with concern for chest pain. On warfarin prior to admission for afib and MVR. Prior to admission regimen, 5mg  every  day except 7.5 Sunday and Fridays (total weekly dose 40 mg). Last dose prior to admission 3/16 at 2100. Pharmacy consulted for warfarin inpatient management and IV heparin bridge while subtherapeutic INR.   Initial heparin level 0.29, below goal on 1000 units/hr, most recent INR 2.4. No issues with infusion or overt s/sx of bleeding. Cardiology is recommending DCCV. Patient received warfarin 7.5mg  overnight. Will give another 7.5mg  dose today given she is still below her INR goal.   Goal of Therapy:  INR goal: 2.5-3.5 Monitor platelets by anticoagulation protocol: Yes   Plan:  Increase heparin to 1150 units/hr  Warfarin 7.5mg  x 1 tonight Heparin level in 8 hours F/u daily INR, heparin level, CBC and warfarin dosing Monitor for new drug-drug interactions   Thank you for allowing pharmacy to participate in this patient's care.  Ruben Im, PharmD Clinical Pharmacist 05/06/2023 11:28 AM Please check AMION for all [facility] Pharmacy numbers

## 2023-05-06 NOTE — ED Notes (Signed)
 Pt states chest pressure currently 5/10

## 2023-05-06 NOTE — Interval H&P Note (Signed)
 History and Physical Interval Note:  05/06/2023 12:28 PM  Kimberly Hobbs  has presented today for surgery, with the diagnosis of afib.  The various methods of treatment have been discussed with the patient and family. After consideration of risks, benefits and other options for treatment, the patient has consented to  Procedure(s): TRANSESOPHAGEAL ECHOCARDIOGRAM (N/A) CARDIOVERSION (N/A) as a surgical intervention.  The patient's history has been reviewed, patient examined, no change in status, stable for surgery.  I have reviewed the patient's chart and labs.  Questions were answered to the patient's satisfaction.     Mehar Sagen

## 2023-05-06 NOTE — Transfer of Care (Signed)
 Immediate Anesthesia Transfer of Care Note  Patient: Kimberly Hobbs  Procedure(s) Performed: TRANSESOPHAGEAL ECHOCARDIOGRAM CARDIOVERSION  Patient Location: PACU and Cath Lab  Anesthesia Type:MAC  Level of Consciousness: awake, patient cooperative, and responds to stimulation  Airway & Oxygen Therapy: Patient Spontanous Breathing and Patient connected to nasal cannula oxygen  Post-op Assessment: Report given to RN and Post -op Vital signs reviewed and stable  Post vital signs: Reviewed and stable  Last Vitals:  Vitals Value Taken Time  BP    Temp    Pulse 101 05/06/23 1422  Resp 19 05/06/23 1422  SpO2 94 % 05/06/23 1422  Vitals shown include unfiled device data.  Last Pain:  Vitals:   05/06/23 1422  TempSrc:   PainSc: 0-No pain         Complications: No notable events documented.

## 2023-05-06 NOTE — Plan of Care (Signed)
  Problem: Education: Goal: Ability to describe self-care measures that may prevent or decrease complications (Diabetes Survival Skills Education) will improve Outcome: Progressing Goal: Individualized Educational Video(s) Outcome: Progressing   Problem: Coping: Goal: Ability to adjust to condition or change in health will improve Outcome: Progressing   Problem: Fluid Volume: Goal: Ability to maintain a balanced intake and output will improve Outcome: Progressing   Problem: Health Behavior/Discharge Planning: Goal: Ability to identify and utilize available resources and services will improve Outcome: Progressing Goal: Ability to manage health-related needs will improve Outcome: Progressing   Problem: Metabolic: Goal: Ability to maintain appropriate glucose levels will improve Outcome: Progressing   Problem: Nutritional: Goal: Maintenance of adequate nutrition will improve Outcome: Progressing Goal: Progress toward achieving an optimal weight will improve Outcome: Progressing   Problem: Skin Integrity: Goal: Risk for impaired skin integrity will decrease Outcome: Progressing   Problem: Tissue Perfusion: Goal: Adequacy of tissue perfusion will improve Outcome: Progressing   Problem: Education: Goal: Knowledge of disease or condition will improve Outcome: Progressing Goal: Understanding of medication regimen will improve Outcome: Progressing Goal: Individualized Educational Video(s) Outcome: Progressing   Problem: Activity: Goal: Ability to tolerate increased activity will improve Outcome: Progressing   Problem: Cardiac: Goal: Ability to achieve and maintain adequate cardiopulmonary perfusion will improve Outcome: Progressing   Problem: Health Behavior/Discharge Planning: Goal: Ability to safely manage health-related needs after discharge will improve Outcome: Progressing   Problem: Education: Goal: Knowledge of General Education information will  improve Description: Including pain rating scale, medication(s)/side effects and non-pharmacologic comfort measures Outcome: Progressing   Problem: Health Behavior/Discharge Planning: Goal: Ability to manage health-related needs will improve Outcome: Progressing   Problem: Clinical Measurements: Goal: Ability to maintain clinical measurements within normal limits will improve Outcome: Progressing Goal: Will remain free from infection Outcome: Progressing Goal: Diagnostic test results will improve Outcome: Progressing Goal: Respiratory complications will improve Outcome: Progressing Goal: Cardiovascular complication will be avoided Outcome: Progressing   Problem: Activity: Goal: Risk for activity intolerance will decrease Outcome: Progressing   Problem: Nutrition: Goal: Adequate nutrition will be maintained Outcome: Progressing   Problem: Coping: Goal: Level of anxiety will decrease Outcome: Progressing   Problem: Elimination: Goal: Will not experience complications related to bowel motility Outcome: Progressing Goal: Will not experience complications related to urinary retention Outcome: Progressing   Problem: Pain Managment: Goal: General experience of comfort will improve and/or be controlled Outcome: Progressing

## 2023-05-06 NOTE — Plan of Care (Signed)
  Problem: Skin Integrity: Goal: Risk for impaired skin integrity will decrease Outcome: Progressing   Problem: Education: Goal: Knowledge of disease or condition will improve Outcome: Progressing   Problem: Activity: Goal: Ability to tolerate increased activity will improve Outcome: Progressing

## 2023-05-06 NOTE — Progress Notes (Signed)
 Rounding Note    Patient Name: Kimberly Hobbs Date of Encounter: 05/06/2023  [facility] Cardiologist: Thomasene Ripple, DO   Subjective   Troponin 12 > 21 > 31.  Creatinine stable at 1.5.  BP 93/70.  Reports no chest pain this morning  Inpatient Medications    Scheduled Meds:  amiodarone  200 mg Oral BID   [START ON 05/07/2023] aspirin EC  81 mg Oral Daily   aspirin  325 mg Oral Once   clonazePAM  0.5 mg Oral QHS   umeclidinium bromide  1 puff Inhalation Daily   And   fluticasone furoate-vilanterol  1 puff Inhalation Daily   gabapentin  100 mg Oral QHS   insulin aspart  0-5 Units Subcutaneous Q4H   isosorbide mononitrate  60 mg Oral Daily   montelukast  10 mg Oral Daily   nitroGLYCERIN  0.4 mg Sublingual Once   pantoprazole  40 mg Oral BID   ramelteon  8 mg Oral QHS   rosuvastatin  40 mg Oral Daily   sacubitril-valsartan  1 tablet Oral BID   sertraline  50 mg Oral QHS   Warfarin - Pharmacist Dosing Inpatient   Does not apply q1600   Continuous Infusions:  heparin 1,000 Units/hr (05/06/23 0122)   PRN Meds: acetaminophen, albuterol, nitroGLYCERIN, ondansetron (ZOFRAN) IV   Vital Signs    Vitals:   05/06/23 0602 05/06/23 0700 05/06/23 0715 05/06/23 0730  BP:    93/70  Pulse:  (!) 48 97 (!) 101  Resp:  19 19 17   Temp: 98.2 F (36.8 C)     TempSrc: Oral     SpO2:  96% 94% 94%  Weight:      Height:        Intake/Output Summary (Last 24 hours) at 05/06/2023 0812 Last data filed at 05/06/2023 0219 Gross per 24 hour  Intake --  Output 500 ml  Net -500 ml      05/06/2023   12:07 AM 05/05/2023   10:24 PM 04/18/2023    8:57 AM  Last 3 Weights  Weight (lbs) 205 lb 0.4 oz 205 lb 205 lb  Weight (kg) 93 kg 92.987 kg 92.987 kg      Telemetry    Afib 80-90s - Personally Reviewed  ECG    No new ECG - Personally Reviewed  Physical Exam   GEN: No acute distress.   Neck: No JVD Cardiac: Irregular, normal rate, mechanical click Respiratory: Clear  to auscultation bilaterally. GI: Soft, nontender, non-distended  MS: No edema; No deformity. Neuro:  Nonfocal  Psych: Normal affect   Labs    High Sensitivity Troponin:   Recent Labs  Lab 05/05/23 2250 05/06/23 0045 05/06/23 0424  TROPONINIHS 12 21* 31*     Chemistry Recent Labs  Lab 05/05/23 2250 05/06/23 0424  NA 131* 136  K 4.2 3.7  CL 96* 100  CO2 22 21*  GLUCOSE 194* 102*  BUN 29* 28*  CREATININE 1.51* 1.50*  CALCIUM 9.1 9.2  MG 2.2  --   PROT  --  7.9  ALBUMIN  --  4.3  AST  --  38  ALT  --  56*  ALKPHOS  --  89  BILITOT  --  0.7  GFRNONAA 39* 39*  ANIONGAP 13 15    Lipids No results for input(s): "CHOL", "TRIG", "HDL", "LABVLDL", "LDLCALC", "CHOLHDL" in the last 168 hours.  Hematology Recent Labs  Lab 05/05/23 2250 05/06/23 0045  WBC 13.5* 14.2*  RBC  5.28* 5.12*  HGB 13.6 13.0  HCT 43.3 41.4  MCV 82.0 80.9  MCH 25.8* 25.4*  MCHC 31.4 31.4  RDW 16.1* 16.0*  PLT 377 360   Thyroid  Recent Labs  Lab 05/05/23 2250  TSH 9.340*    BNP Recent Labs  Lab 05/06/23 0045  BNP 101.7*    DDimer No results for input(s): "DDIMER" in the last 168 hours.   Radiology    DG Chest Port 1 View Result Date: 05/05/2023 CLINICAL DATA:  Chest pain, AFib EXAM: PORTABLE CHEST 1 VIEW COMPARISON:  04/16/2023 FINDINGS: Stable cardiomegaly. Left chest wall ICD. AVR. Sternotomy and CABG. Aortic atherosclerotic calcification. Diffuse interstitial coarsening compatible with edema. No focal consolidation, pleural effusion, or pneumothorax. IMPRESSION: Cardiomegaly with interstitial edema. Electronically Signed   By: Minerva Fester M.D.   On: 05/05/2023 22:54    Cardiac Studies     Patient Profile     63 y.o. 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  Assessment & Plan   #Symptomatic Afib with RVR #Myocardial Injury :: Patient presents clinic symptomatic atrial fibrillation with RVR.  Her heart rates are not terribly fast but she can tell she is in atrial fibrillation.  It is unclear what precipitated this particular exacerbation; however, she does appear volume overloaded which is certainly a contributing factor.  Mildly elevated troponins are likely demand in the setting of RVR.  Ultimately needs restoration of sinus rhythm with DCCV.  Unfortunately she had 1 subtherapeutic INR 2 weeks ago, so would favor TEE prior to DCCV.  Will also bridge her anticoagulation with heparin and will consult pharmacy for warfarin dosing. -Tentatively plan TEE plus DCCV today -Start heparin gtt -Warfarin per pharmacy -Continue amiodarone 200 mg twice daily -Continue metoprolol succinate 100 mg twice daily -TTE -Given Lasix 60 x1   #Rheumatic Heart Disease s/p mMVR and mAVR # Subtherapeutic INR :: INR goal of 2.5-3.5 and currently on warfarin.  Reports great adherence to her medication, but she has had some labile INRs recently.  Her INR on admission was 2.3 which is not quite at goal.  Given that her INR is subtherapeutic and she has mechanical valves, it is prudent that she remain bridged with heparin until she becomes therapeutic. 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  Will benefit from diuresis.  Warm well-perfused so little concern for low output state. -Continue metoprolol as above -Continue Entresto -IV diuresis as needed -Strict I's and O's -Daily weights   #CAD s/p CABG #HLD -Loaded x1 with ASA -Continue ASA 81 mg daily -Continue rosuvastatin 40 mg daily   #DMII -Hold home metformin -SSI plus POC glucose checks   #Anxiety -Continue home sertraline and  clonazepam  For questions or updates, please contact  [facility] Please consult [email or web address] for contact info under        Signed, Little Ishikawa, MD  05/06/2023, 8:12 AM

## 2023-05-06 NOTE — Progress Notes (Signed)
 PHARMACY - PHYSICIAN COMMUNICATION CRITICAL VALUE ALERT - BLOOD CULTURE IDENTIFICATION (BCID)  Kimberly Hobbs is an 63 y.o. female who presented to [facility] on 05/05/2023 with a chief complaint of symptomatic Afib  Assessment:  1/4 GPR  Name of physician (or Provider) Contacted: Dr. Eden Emms  Current antibiotics: none  Changes to prescribed antibiotics recommended:  No changes made - Recommendations accepted by provider  No results found for this or any previous visit.  [name] 05/06/2023  6:45 PM

## 2023-05-06 NOTE — Anesthesia Preprocedure Evaluation (Signed)
 Anesthesia Evaluation  Patient identified by MRN, date of birth, ID band Patient awake    Reviewed: Allergy & Precautions, NPO status , Patient's Chart, lab work & pertinent test results  History of Anesthesia Complications Negative for: history of anesthetic complications  Airway Mallampati: II  TM Distance: >3 FB Neck ROM: full    Dental  (+) Dental Advisory Given   Pulmonary sleep apnea , COPD, former smoker    + decreased breath sounds      Cardiovascular hypertension, Pt. on medications (-) angina + CAD and +CHF  + dysrhythmias Atrial Fibrillation + Cardiac Defibrillator + Valvular Problems/Murmurs  Rhythm:irregular Rate:Tachycardia  Mechanical AVR, mechanical MVR both with normal function. EF 55-60%.   Neuro/Psych  PSYCHIATRIC DISORDERS Anxiety      Neuromuscular disease    GI/Hepatic ,GERD  ,,  Endo/Other  diabetes, Type 2    Renal/GU CRFRenal diseaseLab Results      Component                Value               Date                      NA                       136                 05/06/2023                K                        3.7                 05/06/2023                CO2                      21 (L)              05/06/2023                GLUCOSE                  102 (H)             05/06/2023                BUN                      28 (H)              05/06/2023                CREATININE               1.50 (H)            05/06/2023                CALCIUM                  9.2                 05/06/2023                GFR                      38.09 (L)  10/01/2022                EGFR                     43 (L)              02/26/2023                GFRNONAA                 39 (L)              05/06/2023                Musculoskeletal  (+) Arthritis ,    Abdominal   Peds  Hematology Lab Results      Component                Value               Date                      WBC                       14.2 (H)            05/06/2023                HGB                      13.0                05/06/2023                HCT                      41.4                05/06/2023                MCV                      80.9                05/06/2023                PLT                      360                 05/06/2023            Lab Results      Component                Value               Date                      INR                      2.4 (H)             05/06/2023                INR                      2.3 (H)  05/05/2023                INR                      3.2 (A)             04/29/2023              Anesthesia Other Findings   Reproductive/Obstetrics                              Anesthesia Physical Anesthesia Plan  ASA: 3  Anesthesia Plan: General   Post-op Pain Management:    Induction: Intravenous  PONV Risk Score and Plan: 3 and Treatment may vary due to age or medical condition and Propofol infusion  Airway Management Planned: Mask  Additional Equipment: None  Intra-op Plan:   Post-operative Plan:   Informed Consent: I have reviewed the patients History and Physical, chart, labs and discussed the procedure including the risks, benefits and alternatives for the proposed anesthesia with the patient or authorized representative who has indicated his/her understanding and acceptance.     Dental advisory given  Plan Discussed with: CRNA  Anesthesia Plan Comments:         Anesthesia Quick Evaluation

## 2023-05-06 NOTE — CV Procedure (Signed)
    TRANSESOPHAGEAL ECHOCARDIOGRAM   NAME:  Kimberly Hobbs    MRN: [id number] DOB:  Dec 11, 1960    ADMIT DATE: 05/05/2023  INDICATIONS: Atrial fibrillation  PROCEDURE:   Informed consent was obtained prior to the procedure. The risks, benefits and alternatives for the procedure were discussed and the patient comprehended these risks.  Risks include, but are not limited to, cough, sore throat, vomiting, nausea, somnolence, esophageal and stomach trauma or perforation, bleeding, low blood pressure, aspiration, pneumonia, infection, trauma to the teeth and death.    Procedural time out performed. The oropharynx was anesthetized with viscous lidocaine.  Anesthesia was administered by the anaesthesilogy teamto achieve and maintain moderate to deep conscious sedation.  The patient's heart rate, blood pressure, and oxygen saturation were monitored continuously during the procedure.  The transesophageal probe was inserted in the esophagus and stomach without difficulty and multiple views were obtained.   The patient tolerated the procedure well.  COMPLICATIONS:    There were no immediate complications.  KEY FINDINGS:  Low normal left ventricular systolic function.  Small left atrial appendage clot was seen.  Mechanical mitral valve in place with mild mitral agitation, aortic valve has been replaced well-seated.  Full report to follow. Further management per primary team.  No cardioversion performed  Thomasene Ripple, DO [facility] [place]  [facility]  2:23 PM

## 2023-05-06 NOTE — Progress Notes (Signed)
 Vasopressin by CRNA

## 2023-05-07 DIAGNOSIS — I4891 Unspecified atrial fibrillation: Secondary | ICD-10-CM | POA: Diagnosis not present

## 2023-05-07 LAB — GLUCOSE, CAPILLARY
Glucose-Capillary: 100 mg/dL — ABNORMAL HIGH (ref 70–99)
Glucose-Capillary: 112 mg/dL — ABNORMAL HIGH (ref 70–99)
Glucose-Capillary: 118 mg/dL — ABNORMAL HIGH (ref 70–99)
Glucose-Capillary: 123 mg/dL — ABNORMAL HIGH (ref 70–99)
Glucose-Capillary: 133 mg/dL — ABNORMAL HIGH (ref 70–99)
Glucose-Capillary: 142 mg/dL — ABNORMAL HIGH (ref 70–99)
Glucose-Capillary: 156 mg/dL — ABNORMAL HIGH (ref 70–99)

## 2023-05-07 LAB — CULTURE, BLOOD (ROUTINE X 2): Special Requests: ADEQUATE

## 2023-05-07 LAB — BASIC METABOLIC PANEL
Anion gap: 9 (ref 5–15)
BUN: 29 mg/dL — ABNORMAL HIGH (ref 8–23)
CO2: 27 mmol/L (ref 22–32)
Calcium: 9.1 mg/dL (ref 8.9–10.3)
Chloride: 102 mmol/L (ref 98–111)
Creatinine, Ser: 1.55 mg/dL — ABNORMAL HIGH (ref 0.44–1.00)
GFR, Estimated: 38 mL/min — ABNORMAL LOW (ref >60)
Glucose, Bld: 119 mg/dL — ABNORMAL HIGH (ref 70–99)
Potassium: 4 mmol/L (ref 3.5–5.1)
Sodium: 138 mmol/L (ref 135–145)

## 2023-05-07 LAB — HEPARIN LEVEL (UNFRACTIONATED)
Heparin Unfractionated: 0.36 [IU]/mL (ref 0.30–0.70)
Heparin Unfractionated: 0.39 [IU]/mL (ref 0.30–0.70)
Heparin Unfractionated: 0.41 [IU]/mL (ref 0.30–0.70)

## 2023-05-07 LAB — PROTIME-INR
INR: 2.8 — ABNORMAL HIGH (ref 0.8–1.2)
Prothrombin Time: 29.9 s — ABNORMAL HIGH (ref 11.4–15.2)

## 2023-05-07 LAB — MAGNESIUM: Magnesium: 2.3 mg/dL (ref 1.7–2.4)

## 2023-05-07 MED ORDER — WARFARIN SODIUM 5 MG PO TABS
5.0000 mg | ORAL_TABLET | Freq: Once | ORAL | Status: AC
  Administered 2023-05-07: 5 mg via ORAL
  Filled 2023-05-07: qty 1

## 2023-05-07 MED ORDER — DIGOXIN 0.25 MG/ML IJ SOLN
0.1250 mg | Freq: Once | INTRAMUSCULAR | Status: AC
  Administered 2023-05-07: 0.125 mg via INTRAVENOUS
  Filled 2023-05-07: qty 0.5

## 2023-05-07 MED ORDER — WARFARIN SODIUM 7.5 MG PO TABS
7.5000 mg | ORAL_TABLET | ORAL | Status: AC
  Administered 2023-05-07: 7.5 mg via ORAL
  Filled 2023-05-07: qty 1

## 2023-05-07 NOTE — Progress Notes (Signed)
 Progress Note  Patient Name: Kimberly Hobbs Date of Encounter: 05/07/2023  Primary Cardiologist: Thomasene Ripple, DO   Subjective   Patient seen and examined at her bedside. Status post TTE yesterday with noted small left atrial appendage clot  Inpatient Medications    Scheduled Meds:  amiodarone  200 mg Oral BID   aspirin EC  81 mg Oral Daily   aspirin  325 mg Oral Once   clonazePAM  0.5 mg Oral QHS   umeclidinium bromide  1 puff Inhalation Daily   And   fluticasone furoate-vilanterol  1 puff Inhalation Daily   gabapentin  100 mg Oral QHS   insulin aspart  0-5 Units Subcutaneous Q4H   isosorbide mononitrate  60 mg Oral Daily   montelukast  10 mg Oral Daily   nitroGLYCERIN  0.4 mg Sublingual Once   pantoprazole  40 mg Oral BID   ramelteon  8 mg Oral QHS   rosuvastatin  40 mg Oral Daily   sacubitril-valsartan  1 tablet Oral BID   sertraline  50 mg Oral QHS   warfarin  7.5 mg Oral ONCE-1600   Warfarin - Pharmacist Dosing Inpatient   Does not apply q1600   Continuous Infusions:  heparin 1,150 Units/hr (05/07/23 0554)   PRN Meds: acetaminophen, albuterol, nitroGLYCERIN, ondansetron (ZOFRAN) IV   Vital Signs    Vitals:   05/06/23 2000 05/06/23 2315 05/07/23 0428 05/07/23 0730  BP: 103/62 91/70 92/63  106/78  Pulse: 98 92 (!) 105 92  Resp: 20 17 17 18   Temp:  98.4 F (36.9 C) 98.2 F (36.8 C) 97.9 F (36.6 C)  TempSrc: Oral Oral Oral Oral  SpO2: 95% 90% 94% 94%  Weight:   90.5 kg   Height:        Intake/Output Summary (Last 24 hours) at 05/07/2023 0810 Last data filed at 05/07/2023 0554 Gross per 24 hour  Intake 834.54 ml  Output 550 ml  Net 284.54 ml   Filed Weights   05/06/23 1143 05/06/23 1749 05/07/23 0428  Weight: 91.2 kg 91.4 kg 90.5 kg    Telemetry    Afib with rvr - Personally Reviewed  ECG     - Personally Reviewed  Physical Exam   General: Comfortable Head: Atraumatic, normal size  Eyes: PEERLA, EOMI  Neck: Supple, normal  JVD Cardiac: Normal S1, S2; RRR; +  murmurs with mechanical click, rubs, or gallops Lungs: Clear to auscultation bilaterally Abd: Soft, nontender, no hepatomegaly  Ext: warm, no edema Musculoskeletal: No deformities, BUE and BLE strength normal and equal Skin: Warm and dry, no rashes   Neuro: Alert and oriented to person, place, time, and situation, CNII-XII grossly intact, no focal deficits  Psych: Normal mood and affect   Labs    Chemistry Recent Labs  Lab 05/05/23 2250 05/06/23 0424 05/07/23 0243  NA 131* 136 138  K 4.2 3.7 4.0  CL 96* 100 102  CO2 22 21* 27  GLUCOSE 194* 102* 119*  BUN 29* 28* 29*  CREATININE 1.51* 1.50* 1.55*  CALCIUM 9.1 9.2 9.1  PROT  --  7.9  --   ALBUMIN  --  4.3  --   AST  --  38  --   ALT  --  56*  --   ALKPHOS  --  89  --   BILITOT  --  0.7  --   GFRNONAA 39* 39* 38*  ANIONGAP 13 15 9      Hematology Recent Labs  Lab 05/05/23 2250  05/06/23 0045  WBC 13.5* 14.2*  RBC 5.28* 5.12*  HGB 13.6 13.0  HCT 43.3 41.4  MCV 82.0 80.9  MCH 25.8* 25.4*  MCHC 31.4 31.4  RDW 16.1* 16.0*  PLT 377 360    Cardiac EnzymesNo results for input(s): "TROPONINI" in the last 168 hours. No results for input(s): "TROPIPOC" in the last 168 hours.   BNP Recent Labs  Lab 05/06/23 0045  BNP 101.7*     DDimer No results for input(s): "DDIMER" in the last 168 hours.   Radiology    ECHO TEE Result Date: 05/06/2023    TRANSESOPHOGEAL ECHO REPORT   Patient Name:   Kimberly Hobbs Date of Exam: 05/06/2023 Medical Rec #:  [id number]          Height:       65.0 in Accession #:    [id number]         Weight:       205.0 lb Date of Birth:  09/15/1960           BSA:          1.999 m Patient Age:    63 years           BP:           178/141 mmHg Patient Gender: F                  HR:           96 bpm. Exam Location:  Inpatient Procedure: Cardiac Doppler, Color Doppler and Transesophageal Echo (Both            Spectral and Color Flow Doppler were utilized during  procedure). Indications:    Cardioversion  History:        Patient has prior history of Echocardiogram examinations, most                 recent 02/03/2023.  Sonographer:    Harriette Bouillon RDCS Referring Phys: [id number] Karl Ito PROCEDURE: After discussion of the risks and benefits of a TEE, an informed consent was obtained. The transesophogeal probe was passed without difficulty through the esophogus of the patient. The patient was monitored while under deep sedation. Anesthestetic sedation was provided intravenously by Anesthesiology: 300mg  of Propofol, 40mg  of Lidocaine.  IMPRESSIONS  1. Left ventricular ejection fraction, by estimation, is 55 to 60%. The left ventricle has normal function. Left ventricular diastolic function could not be evaluated.  2. Right ventricular systolic function is low normal. The right ventricular size is normal.  3. Very small mobile thrombus in the presence of the left atrial appendage with smoke. Left atrial size was moderately dilated. A left atrial/left atrial appendage thrombus was detected. The LAA emptying velocity was 22 cm/s.  4. Right atrial size was mildly dilated.  5. Mild mitral valve regurgitation. FINDINGS  Left Ventricle: Left ventricular ejection fraction, by estimation, is 55 to 60%. The left ventricle has normal function. The left ventricular internal cavity size was normal in size. Left ventricular diastolic function could not be evaluated due to mitral valve replacement. Left ventricular diastolic function could not be evaluated. Right Ventricle: The right ventricular size is normal. No increase in right ventricular wall thickness. Right ventricular systolic function is low normal. Left Atrium: Very small mobile thrombus in the presence of the left atrial appendage with smoke. Left atrial size was moderately dilated. A left atrial/left atrial appendage thrombus was detected. The LAA emptying velocity was 22 cm/s. Right Atrium: Right atrial  size was mildly  dilated. Mitral Valve: Mild mitral valve regurgitation, with two seperate jets. Aorta: There is minimal (Grade I) layered plaque involving the aortic root. IAS/Shunts: No atrial level shunt detected by color flow Doppler. Thomasene Ripple DO Electronically signed by Thomasene Ripple DO Signature Date/Time: 05/06/2023/6:14:19 PM    Final    EP STUDY Result Date: 05/06/2023 See surgical note for result.  DG Chest Port 1 View Result Date: 05/05/2023 CLINICAL DATA:  Chest pain, AFib EXAM: PORTABLE CHEST 1 VIEW COMPARISON:  04/16/2023 FINDINGS: Stable cardiomegaly. Left chest wall ICD. AVR. Sternotomy and CABG. Aortic atherosclerotic calcification. Diffuse interstitial coarsening compatible with edema. No focal consolidation, pleural effusion, or pneumothorax. IMPRESSION: Cardiomegaly with interstitial edema. Electronically Signed   By: Minerva Fester M.D.   On: 05/05/2023 22:54    Cardiac Studies   Echo/TEE   Patient Profile     63 y.o. 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   Assessment & Plan   Symptomatic Atrial fibrillation with rvr Myocardial Injury  Rheumatic heart diease s/p mMVR and mAVR  Subtherapeutic INR  Heart Failure with improved EF  CAD s/p CABG  DMII  HLD   TEE showed very small LAA clot with smoke, I was not able to proceed with cardioversion yesterday. We will continue to rate control for now. She is on Amiodarone 200 mg BID . I would prefer not to increase this for her now. Heart rate still not well controlled - will give one time dose of digoxin - 125 mcg IV ( also will low blood pressure). Ideally she will benefit from rhythm control strategy. She is symptomatic.   Mild troponin elevation no need for ischemic eval at this  time  INR today 2.9 - Target INR 2.5 - 3.5 , hopefully could be discharged tomorrow if INR remains at target  Clinically appears to be euvolemic  DM - continue insulin   For questions or updates, please contact [facility] Please consult [email or web address] for contact info under Cardiology/STEMI.      Signed, Derrick Tiegs, DO  05/07/2023, 8:10 AM

## 2023-05-07 NOTE — Progress Notes (Signed)
 Patient c/o left chest pressure, some nausea, denies pain.  Patients states she feels the pressure when her Afib HR goes up above 115.  EKG completed.  Critical results show QTC 570.  Dr. Cherly Beach paged.  Waiting return call.  Patients reports that symptoms are gone at current time and in NAD.

## 2023-05-07 NOTE — Plan of Care (Signed)
  Problem: Education: Goal: Ability to describe self-care measures that may prevent or decrease complications (Diabetes Survival Skills Education) will improve Outcome: Progressing Goal: Individualized Educational Video(s) Outcome: Progressing   Problem: Coping: Goal: Ability to adjust to condition or change in health will improve Outcome: Progressing   Problem: Fluid Volume: Goal: Ability to maintain a balanced intake and output will improve Outcome: Progressing   Problem: Health Behavior/Discharge Planning: Goal: Ability to identify and utilize available resources and services will improve Outcome: Progressing Goal: Ability to manage health-related needs will improve Outcome: Progressing   Problem: Metabolic: Goal: Ability to maintain appropriate glucose levels will improve Outcome: Progressing   Problem: Nutritional: Goal: Maintenance of adequate nutrition will improve Outcome: Progressing Goal: Progress toward achieving an optimal weight will improve Outcome: Progressing   Problem: Skin Integrity: Goal: Risk for impaired skin integrity will decrease Outcome: Progressing   Problem: Tissue Perfusion: Goal: Adequacy of tissue perfusion will improve Outcome: Progressing   Problem: Education: Goal: Knowledge of disease or condition will improve Outcome: Progressing Goal: Understanding of medication regimen will improve Outcome: Progressing Goal: Individualized Educational Video(s) Outcome: Progressing   Problem: Activity: Goal: Ability to tolerate increased activity will improve Outcome: Progressing   Problem: Cardiac: Goal: Ability to achieve and maintain adequate cardiopulmonary perfusion will improve Outcome: Progressing   Problem: Health Behavior/Discharge Planning: Goal: Ability to safely manage health-related needs after discharge will improve Outcome: Progressing   Problem: Education: Goal: Knowledge of General Education information will  improve Description: Including pain rating scale, medication(s)/side effects and non-pharmacologic comfort measures Outcome: Progressing   Problem: Health Behavior/Discharge Planning: Goal: Ability to manage health-related needs will improve Outcome: Progressing   Problem: Clinical Measurements: Goal: Ability to maintain clinical measurements within normal limits will improve Outcome: Progressing Goal: Will remain free from infection Outcome: Progressing Goal: Diagnostic test results will improve Outcome: Progressing Goal: Respiratory complications will improve Outcome: Progressing Goal: Cardiovascular complication will be avoided Outcome: Progressing   Problem: Activity: Goal: Risk for activity intolerance will decrease Outcome: Progressing   Problem: Nutrition: Goal: Adequate nutrition will be maintained Outcome: Progressing   Problem: Coping: Goal: Level of anxiety will decrease Outcome: Progressing   Problem: Elimination: Goal: Will not experience complications related to bowel motility Outcome: Progressing Goal: Will not experience complications related to urinary retention Outcome: Progressing   Problem: Pain Managment: Goal: General experience of comfort will improve and/or be controlled Outcome: Progressing   Problem: Safety: Goal: Ability to remain free from injury will improve Outcome: Progressing   Problem: Skin Integrity: Goal: Risk for impaired skin integrity will decrease Outcome: Progressing

## 2023-05-07 NOTE — Progress Notes (Signed)
 PHARMACY - ANTICOAGULATION CONSULT NOTE  Pharmacy Consult for warfarin and IV heparin Indication: atrial fibrillation, MVR  Allergies  Allergen Reactions   Duloxetine Hcl Other (See Comments)    Behavioral changes, sleep disturbances   Iodinated Contrast Media Other (See Comments)    Patient is unsure of reaction type   Penicillins Other (See Comments)    Immune to drug , does not work per patient    Doxycycline Nausea And Vomiting    Vital Signs: Temp: 97.9 F (36.6 C) (03/19 0730) Temp Source: Oral (03/19 0730) BP: 106/78 (03/19 0730) Pulse Rate: 92 (03/19 0730) Heparin weight: 77.8 kg  Labs: Recent Labs    05/05/23 2250 05/06/23 0045 05/06/23 0424 05/06/23 1028 05/06/23 1927 05/07/23 0003 05/07/23 0243  HGB 13.6 13.0  --   --   --   --   --   HCT 43.3 41.4  --   --   --   --   --   PLT 377 360  --   --   --   --   --   APTT  --  43*  --   --   --   --   --   LABPROT 25.9* 26.3*  --   --   --   --  29.9*  INR 2.3* 2.4*  --   --   --   --  2.8*  HEPARINUNFRC  --   --   --    < > 0.26* 0.41 0.36  CREATININE 1.51*  --  1.50*  --   --   --  1.55*  TROPONINIHS 12 21* 31*  --   --   --   --    < > = values in this interval not displayed.    Estimated Creatinine Clearance: 42.4 mL/min (A) (by C-G formula based on SCr of 1.55 mg/dL (H)).   Medical History: Past Medical History:  Diagnosis Date   Allergy ?   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   COPD (chronic obstructive pulmonary disease) (HCC)    COVID-19    GERD (gastroesophageal reflux disease)    H/O mitral valve replacement with mechanical #25 mechanical SJM 01/01/2017) 01/01/2017   Hyperlipidemia    Hypertension    Pain in both lower extremities 07/23/2019   Paroxysmal atrial fibrillation (HCC) 06/29/2015   Rheumatic heart disease    MS/ AS   Sleep apnea    Status post mechanical 19 mm mechanical regent AV  replacement 01/01/2017 01/01/2017    Assessment: Kimberly Hobbs is a 63 y.o. year old female presented on 05/05/2023 with concern for chest pain. On warfarin prior to admission for afib and MVR.  Rheumatic heart disease s/p mMVR/mAVR (01/01/17)  Last warfarin dose PTA 3/16 at 2100.  PTA dose: warfarin 5mg  daily, except 7.5 Sunday and Friday (total weekly dose 40 mg).  Pharmacy consulted for warfarin inpatient management and IV heparin bridge while subtherapeutic INR.   Heparin level is therapeutic this morning at 0.36 INR therapeutic at 2.8 this morning. No issues with infusion or bleeding noted.  Patient received warfarin 7.5mg  on 3/18 @ 0015. (This was dose for 3/17). No dose given later for 3/18.  Goal of Therapy:  INR goal: 2.5-3.5 Monitor platelets by anticoagulation protocol: Yes   Plan:  Continue heparin 1150 units/hr until INR therapeutic x 2 (hopefully 3/20) Warfarin 7.5  mg x 1 now, then 5 mg this afternoon F/u daily INR, heparin level, CBC and warfarin dosing Monitor for new drug-drug interactions    Thank you for allowing pharmacy to be a part of this patient's care.   Signe Colt, PharmD 05/07/2023 9:39 AM  **Pharmacist phone directory can be found on [email or web address] listed under [facility]**

## 2023-05-07 NOTE — Progress Notes (Signed)
 PHARMACY - ANTICOAGULATION CONSULT NOTE  Pharmacy Consult for heparin Indication:  Afib/MVR  Labs: Recent Labs    05/05/23 2250 05/06/23 0045 05/06/23 0424 05/06/23 1028 05/06/23 1927 05/07/23 0003  HGB 13.6 13.0  --   --   --   --   HCT 43.3 41.4  --   --   --   --   PLT 377 360  --   --   --   --   APTT  --  43*  --   --   --   --   LABPROT 25.9* 26.3*  --   --   --   --   INR 2.3* 2.4*  --   --   --   --   HEPARINUNFRC  --   --   --  0.29* 0.26* 0.41  CREATININE 1.51*  --  1.50*  --   --   --   TROPONINIHS 12 21* 31*  --   --   --    Assessment/Plan:  63yo female therapeutic on heparin. Will continue infusion at current rate of 1150 units/hr and confirm stable with am labs.  Vernard Gambles, PharmD, BCPS 05/07/2023 12:39 AM

## 2023-05-07 NOTE — Progress Notes (Signed)
 Mobility Specialist Progress Note;   05/07/23 1520  Mobility  Activity Ambulated with assistance in hallway  Level of Assistance Standby assist, set-up cues, supervision of patient - no hands on  Assistive Device None  Distance Ambulated (ft) 250 ft  Activity Response Tolerated well  Mobility Referral Yes  Mobility visit 1 Mobility  Mobility Specialist Start Time (ACUTE ONLY) 1520  Mobility Specialist Stop Time (ACUTE ONLY) 1540  Mobility Specialist Time Calculation (min) (ACUTE ONLY) 20 min   Pt agreeable to mobility. Required no physical assistance during ambulation, SV. HR up to 125bpm w/ activity, recovered to 104 at rest. Requested assistance to BR at EOS, void successful. Pt c/o chest pressure, however recovered once back lying in bed. Pt left in bed with all needs met.  Caesar Bookman Mobility Specialist Please contact via SecureChat or [employer] [phone]

## 2023-05-08 DIAGNOSIS — I4891 Unspecified atrial fibrillation: Secondary | ICD-10-CM | POA: Diagnosis not present

## 2023-05-08 DIAGNOSIS — Z952 Presence of prosthetic heart valve: Secondary | ICD-10-CM | POA: Diagnosis not present

## 2023-05-08 DIAGNOSIS — I2510 Atherosclerotic heart disease of native coronary artery without angina pectoris: Secondary | ICD-10-CM

## 2023-05-08 LAB — BASIC METABOLIC PANEL
Anion gap: 12 (ref 5–15)
BUN: 25 mg/dL — ABNORMAL HIGH (ref 8–23)
CO2: 22 mmol/L (ref 22–32)
Calcium: 9 mg/dL (ref 8.9–10.3)
Chloride: 104 mmol/L (ref 98–111)
Creatinine, Ser: 1.23 mg/dL — ABNORMAL HIGH (ref 0.44–1.00)
GFR, Estimated: 50 mL/min — ABNORMAL LOW (ref >60)
Glucose, Bld: 141 mg/dL — ABNORMAL HIGH (ref 70–99)
Potassium: 3.6 mmol/L (ref 3.5–5.1)
Sodium: 138 mmol/L (ref 135–145)

## 2023-05-08 LAB — GLUCOSE, CAPILLARY
Glucose-Capillary: 141 mg/dL — ABNORMAL HIGH (ref 70–99)
Glucose-Capillary: 159 mg/dL — ABNORMAL HIGH (ref 70–99)
Glucose-Capillary: 104 mg/dL — ABNORMAL HIGH (ref 70–99)
Glucose-Capillary: 104 mg/dL — ABNORMAL HIGH (ref 70–99)
Glucose-Capillary: 102 mg/dL — ABNORMAL HIGH (ref 70–99)

## 2023-05-08 LAB — PROTIME-INR
INR: 3.3 — ABNORMAL HIGH (ref 0.8–1.2)
Prothrombin Time: 33.9 s — ABNORMAL HIGH (ref 11.4–15.2)

## 2023-05-08 LAB — HEPARIN LEVEL (UNFRACTIONATED): Heparin Unfractionated: 0.35 [IU]/mL (ref 0.30–0.70)

## 2023-05-08 MED ORDER — INSULIN ASPART 100 UNIT/ML IJ SOLN: 0.0000 [IU] | Freq: Three times a day (TID) | INTRAMUSCULAR | Status: DC

## 2023-05-08 MED ORDER — LORAZEPAM 2 MG/ML IJ SOLN
0.5000 mg | Freq: Once | INTRAMUSCULAR | Status: AC
  Administered 2023-05-08: 0.5 mg via INTRAVENOUS
  Filled 2023-05-08: qty 1

## 2023-05-08 MED ORDER — INSULIN ASPART 100 UNIT/ML IJ SOLN: 0.0000 [IU] | Freq: Every day | INTRAMUSCULAR | Status: DC

## 2023-05-08 MED ORDER — METOPROLOL TARTRATE 25 MG PO TABS
25.0000 mg | ORAL_TABLET | Freq: Four times a day (QID) | ORAL | Status: DC
  Administered 2023-05-08 – 2023-05-09 (×6): 25 mg via ORAL
  Filled 2023-05-08 (×6): qty 1

## 2023-05-08 MED ORDER — WARFARIN SODIUM 5 MG PO TABS
5.0000 mg | ORAL_TABLET | Freq: Once | ORAL | Status: AC
  Administered 2023-05-08: 5 mg via ORAL
  Filled 2023-05-08: qty 1

## 2023-05-08 NOTE — Progress Notes (Signed)
 Patient tearful, anxious.  Requesting ativan for her anxiety and chest pressure.  States that it worked last pm.  Pt states that she has chest pain/pressure, when CP explored with patient she denies that it is pain and refused ntg sl, states its pressure and is very anxious at this time.  Spoke with Cardiology doctor, ativan ordered and given.

## 2023-05-08 NOTE — Progress Notes (Signed)
 Patient very anxious and having chest pressure whenever her AF rates are >110. Consistently AF/RVR 110-120s. Will start metop tartrate 25 mg PO q6h in addition to current regimen of amio/dig to better rate control. HTN currently so have room to uptitrate as needed.

## 2023-05-08 NOTE — Progress Notes (Signed)
 Progress Note  Patient Name: Kimberly Hobbs Date of Encounter: 05/08/2023  Primary Cardiologist: Thomasene Ripple, DO   Subjective   Pt comfortable laying in bed   Inpatient Medications    Scheduled Meds:  amiodarone  200 mg Oral BID   aspirin EC  81 mg Oral Daily   aspirin  325 mg Oral Once   clonazePAM  0.5 mg Oral QHS   umeclidinium bromide  1 puff Inhalation Daily   And   fluticasone furoate-vilanterol  1 puff Inhalation Daily   gabapentin  100 mg Oral QHS   insulin aspart  0-5 Units Subcutaneous Q4H   isosorbide mononitrate  60 mg Oral Daily   metoprolol tartrate  25 mg Oral Q6H   montelukast  10 mg Oral Daily   nitroGLYCERIN  0.4 mg Sublingual Once   pantoprazole  40 mg Oral BID   ramelteon  8 mg Oral QHS   rosuvastatin  40 mg Oral Daily   sacubitril-valsartan  1 tablet Oral BID   sertraline  50 mg Oral QHS   warfarin  5 mg Oral ONCE-1600   Warfarin - Pharmacist Dosing Inpatient   Does not apply q1600   Continuous Infusions:  heparin 1,150 Units/hr (05/08/23 0447)   PRN Meds: acetaminophen, albuterol, nitroGLYCERIN, ondansetron (ZOFRAN) IV   Vital Signs    Vitals:   05/08/23 0318 05/08/23 0348 05/08/23 0713 05/08/23 1137  BP: 124/69 114/66 126/74 122/71  Pulse: 88 82 94 98  Resp: 18 17 19 16   Temp: 97.9 F (36.6 C)  98.8 F (37.1 C) 98.6 F (37 C)  TempSrc: Oral  Oral Oral  SpO2: 93% 95% 95% 99%  Weight:  91.4 kg    Height:        Intake/Output Summary (Last 24 hours) at 05/08/2023 1432 Last data filed at 05/08/2023 1022 Gross per 24 hour  Intake 431.95 ml  Output 1600 ml  Net -1168.05 ml   Filed Weights   05/06/23 1749 05/07/23 0428 05/08/23 0348  Weight: 91.4 kg 90.5 kg 91.4 kg    Telemetry    Afib rates 100 - Personally Reviewed  ECG     - Personally Reviewed  Physical Exam   General: Comfortable Neck  No JVD   Cardiac: Normal S1, S2;   irreg irreg   Crisp valve sounds  Soft systolic murmur  Lungs: Clear to auscultation Abd:  Soft, nontender, no hepatomegaly  Ext: warm, no edema  Labs    Chemistry Recent Labs  Lab 05/06/23 0424 05/07/23 0243 05/08/23 0237  NA 136 138 138  K 3.7 4.0 3.6  CL 100 102 104  CO2 21* 27 22  GLUCOSE 102* 119* 141*  BUN 28* 29* 25*  CREATININE 1.50* 1.55* 1.23*  CALCIUM 9.2 9.1 9.0  PROT 7.9  --   --   ALBUMIN 4.3  --   --   AST 38  --   --   ALT 56*  --   --   ALKPHOS 89  --   --   BILITOT 0.7  --   --   GFRNONAA 39* 38* 50*  ANIONGAP 15 9 12      Hematology Recent Labs  Lab 05/05/23 2250 05/06/23 0045  WBC 13.5* 14.2*  RBC 5.28* 5.12*  HGB 13.6 13.0  HCT 43.3 41.4  MCV 82.0 80.9  MCH 25.8* 25.4*  MCHC 31.4 31.4  RDW 16.1* 16.0*  PLT 377 360    Cardiac EnzymesNo results for input(s): "TROPONINI" in the  last 168 hours. No results for input(s): "TROPIPOC" in the last 168 hours.   BNP Recent Labs  Lab 05/06/23 0045  BNP 101.7*     DDimer No results for input(s): "DDIMER" in the last 168 hours.     Cardiac Studies   TEE   3.19.25  No report  Cardiac MRI  Dec 18/ 2024     -Mild hypertrophy  -Normal size -Mild systolic dysfunction  Normal ECV (23%)  Normal T2 values  -RV insertion site LGE  LV EF:  40% (Normal 52-79%)   Absolute volumes:  LV EDV: 98mL (Normal 78-167 mL)  LV ESV: 58mL (Normal 21-64 mL)  LV SV: 39mL (Normal 52-114 mL)  CO: 4.1L/min (Normal 2.7-6.3 L/min)   Indexed volumes:   LV EDV: 60mL/sq-m (Normal 50-96 mL/sq-m)  LV ESV: 28mL/sq-m (Normal 10-40 mL/sq-m)  LV SV: 79mL/sq-m (Normal 33-64 mL/sq-m)  CI: 2.0L/min/sq-m (Normal 1.9-3.9 L/min/sq-m)  Right ventricle:  -Normal size  -Moderate systolic dysfunction.  Regional RV dyskinesis near apex  RV EF: 39% (Normal 52-80%)   Absolute volumes:  RV EDV: (Normal 79-175 mL)  RV ESV: 75mL (Normal 13-75 mL)  RV SV: 48mL (Normal 56-110 mL)  CO: 5.0L/min (Normal 2.7-6 L/min)   Indexed volumes:   RV EDV: 82mL/sq-m (Normal 51-97 mL/sq-m) RV ESV: 61mL/sq-m (Normal 9-42  mL/sq-m)  RV SV: 9mL/sq-m (Normal 35-61 mL/sq-m)  CI: 2.4 L/min/sq-m (Normal 1.8-3.8 L/min/sq-m)   Left atrium: Mild enlargement  Right atrium: Normal size  Mitral valve: S/p mechanical MVR.  Mild regurgitation  Aortic valve: S/p mechanical AVR. Trivial regurgitation  Tricuspid valve: Mild regurgitation  Pulmonic valve: Trivial regurgitation  Aorta: Normal proximal ascending aorta  Pulmonary artery: Dilated main pulmonary artery measuring 33mm   Pericardium: Normal   IMPRESSION: 1. Normal RV size with moderate systolic dysfunction (EF 39%). There is regional RV dyskinesis near apex. Given regional RV dyskinesis and RVEF <40%, would meet 1 major imaging criteria for ARVC (though no RV dilatation)   2.  Normal LV size with mild systolic dysfunction (EF 40%)   3. RV insertion site LGE, which is a nonspecific scar finding often seen in setting of elevated pulmonary pressures   4.  Dilated main pulmonary artery measuring 33mm   Echo 02/03/23  1. Left ventricular ejection fraction, by estimation, is 55 to 60%. The  left ventricle has normal function. The left ventricle has no regional  wall motion abnormalities. Left ventricular diastolic parameters are  indeterminate.   2. Right ventricular systolic function is mildly reduced. The right  ventricular size is normal.   3. Left atrial size was mildly dilated.   4. The mitral valve has been repaired/replaced. No evidence of mitral  valve regurgitation. No evidence of mitral stenosis. The mean mitral valve  gradient is 3.0 mmHg. Echo findings are consistent with normal structure  and function of the mitral valve  prosthesis.   5. The aortic valve has been repaired/replaced. There is moderate  calcification of the aortic valve. Aortic valve regurgitation is not  visualized. Mild aortic valve stenosis. Aortic valve area, by VTI measures  1.34 cm. Aortic valve mean gradient  measures 19.5 mmHg. Aortic valve Vmax measures 3.00 m/s.    6. The inferior vena cava is normal in size with greater than 50%  respiratory variability, suggesting right atrial pressure of 3 mmHg.   Conclusion(s)/Recommendation(s): Gradient through mechanical aortic valve  mildly elevated but no change since echo 2/24.   Patient Profile     63 y.o.  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   Assessment & Plan     Atrial fibrillation  Pt presented in afib with RVR     TEE yesterday showed very small thrombus in LA  Did not have cardioversion    Plan for Rate control  and coumadin  Close follow up of INR as outpt and cardioversion in 3 wks  Keep on amiodarone 200 bid    Follow HR   I do not think with this dose she would cardiovert to SR No Dig  Ambulate    Follow HR    CAD    s/p CABG   Trivial elevation of trop  Most likely demand in setting of afib with RVR   S/P MVR/AVR   INR today 3.3 today   Keep on coumadin     HFmrEF    Echo in Dec reported normal   MRI reported LVEF down   Will access TEE images and review  Clinically appears to be euvolemic  HL   Continue Crestor  DM - continue insulin   For questions or updates, please contact [facility] Please consult [email or web address] for contact info under Cardiology/STEMI.      Signed, Dietrich Pates, MD  05/08/2023, 2:32 PM

## 2023-05-08 NOTE — Progress Notes (Signed)
 PHARMACY - ANTICOAGULATION CONSULT NOTE  Pharmacy Consult for warfarin and IV heparin Indication: atrial fibrillation, MVR  Allergies  Allergen Reactions   Duloxetine Hcl Other (See Comments)    Behavioral changes, sleep disturbances   Iodinated Contrast Media Other (See Comments)    Patient is unsure of reaction type   Penicillins Other (See Comments)    Immune to drug , does not work per patient    Doxycycline Nausea And Vomiting    Vital Signs: Temp: 98.8 F (37.1 C) (03/20 0713) Temp Source: Oral (03/20 0713) BP: 126/74 (03/20 0713) Pulse Rate: 94 (03/20 0713) Heparin weight: 77.8 kg  Labs: Recent Labs    05/05/23 2250 05/06/23 0045 05/06/23 0424 05/06/23 1028 05/07/23 0243 05/07/23 1026 05/08/23 0237  HGB 13.6 13.0  --   --   --   --   --   HCT 43.3 41.4  --   --   --   --   --   PLT 377 360  --   --   --   --   --   APTT  --  43*  --   --   --   --   --   LABPROT 25.9* 26.3*  --   --  29.9*  --  33.9*  INR 2.3* 2.4*  --   --  2.8*  --  3.3*  HEPARINUNFRC  --   --   --    < > 0.36 0.39 0.35  CREATININE 1.51*  --  1.50*  --  1.55*  --  1.23*  TROPONINIHS 12 21* 31*  --   --   --   --    < > = values in this interval not displayed.    Estimated Creatinine Clearance: 53.7 mL/min (A) (by C-G formula based on SCr of 1.23 mg/dL (H)).   Medical History: Past Medical History:  Diagnosis Date   Allergy ?   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   COPD (chronic obstructive pulmonary disease) (HCC)    COVID-19    GERD (gastroesophageal reflux disease)    H/O mitral valve replacement with mechanical #25 mechanical SJM 01/01/2017) 01/01/2017   Hyperlipidemia    Hypertension    Pain in both lower extremities 07/23/2019   Paroxysmal atrial fibrillation (HCC) 06/29/2015   Rheumatic heart disease    MS/ AS   Sleep apnea    Status post mechanical 19 mm mechanical regent AV  replacement 01/01/2017 01/01/2017    Assessment: Kimberly Hobbs is a 63 y.o. year old female presented on 05/05/2023 with concern for chest pain. On warfarin prior to admission for afib and MVR.  Rheumatic heart disease s/p mMVR/mAVR (01/01/17)  Last warfarin dose PTA 3/16 at 2100.  PTA dose: warfarin 5mg  daily, except 7.5 Sunday and Friday (total weekly dose 40 mg).  Pharmacy consulted for warfarin inpatient management and IV heparin bridge while subtherapeutic INR.   Heparin level remains therapeutic on 1150 units/hr, INR now therapeutic x 2d on warfarin  Goal of Therapy:  INR goal: 2.5-3.5 Monitor platelets by anticoagulation protocol: Yes   Plan:  Warfarin 5mg  PO x 1 today Daily INR, s/s bleeding Continue heparin gtt at 1150 units/hr Daily heparin level, CBC  Daylene Posey, PharmD, [facility] Clinical Pharmacist ED Pharmacist Phone # [phone] 05/08/2023 7:45 AM

## 2023-05-08 NOTE — Anesthesia Postprocedure Evaluation (Signed)
 Anesthesia Post Note  Patient: Kimberly Hobbs  Procedure(s) Performed: TRANSESOPHAGEAL ECHOCARDIOGRAM CARDIOVERSION     Patient location during evaluation: Cath Lab Anesthesia Type: General Level of consciousness: awake and alert Pain management: pain level controlled Vital Signs Assessment: post-procedure vital signs reviewed and stable Respiratory status: spontaneous breathing, nonlabored ventilation and respiratory function stable Cardiovascular status: stable and blood pressure returned to baseline Postop Assessment: no apparent nausea or vomiting Anesthetic complications: no   No notable events documented.                Nary Sneed

## 2023-05-08 NOTE — Plan of Care (Signed)
  Problem: Education: Goal: Ability to describe self-care measures that may prevent or decrease complications (Diabetes Survival Skills Education) will improve Outcome: Progressing Goal: Individualized Educational Video(s) Outcome: Progressing   Problem: Coping: Goal: Ability to adjust to condition or change in health will improve Outcome: Progressing   Problem: Fluid Volume: Goal: Ability to maintain a balanced intake and output will improve Outcome: Progressing   Problem: Health Behavior/Discharge Planning: Goal: Ability to identify and utilize available resources and services will improve Outcome: Progressing Goal: Ability to manage health-related needs will improve Outcome: Progressing   Problem: Metabolic: Goal: Ability to maintain appropriate glucose levels will improve Outcome: Progressing   Problem: Nutritional: Goal: Maintenance of adequate nutrition will improve Outcome: Progressing Goal: Progress toward achieving an optimal weight will improve Outcome: Progressing   Problem: Skin Integrity: Goal: Risk for impaired skin integrity will decrease Outcome: Progressing   Problem: Tissue Perfusion: Goal: Adequacy of tissue perfusion will improve Outcome: Progressing   Problem: Education: Goal: Knowledge of disease or condition will improve Outcome: Progressing Goal: Understanding of medication regimen will improve Outcome: Progressing Goal: Individualized Educational Video(s) Outcome: Progressing   Problem: Activity: Goal: Ability to tolerate increased activity will improve Outcome: Progressing   Problem: Cardiac: Goal: Ability to achieve and maintain adequate cardiopulmonary perfusion will improve Outcome: Progressing   Problem: Health Behavior/Discharge Planning: Goal: Ability to safely manage health-related needs after discharge will improve Outcome: Progressing   Problem: Education: Goal: Knowledge of General Education information will  improve Description: Including pain rating scale, medication(s)/side effects and non-pharmacologic comfort measures Outcome: Progressing   Problem: Health Behavior/Discharge Planning: Goal: Ability to manage health-related needs will improve Outcome: Progressing   Problem: Clinical Measurements: Goal: Ability to maintain clinical measurements within normal limits will improve Outcome: Progressing Goal: Will remain free from infection Outcome: Progressing Goal: Diagnostic test results will improve Outcome: Progressing Goal: Respiratory complications will improve Outcome: Progressing Goal: Cardiovascular complication will be avoided Outcome: Progressing   Problem: Activity: Goal: Risk for activity intolerance will decrease Outcome: Progressing   Problem: Nutrition: Goal: Adequate nutrition will be maintained Outcome: Progressing   Problem: Coping: Goal: Level of anxiety will decrease Outcome: Progressing   Problem: Elimination: Goal: Will not experience complications related to bowel motility Outcome: Progressing Goal: Will not experience complications related to urinary retention Outcome: Progressing   Problem: Pain Managment: Goal: General experience of comfort will improve and/or be controlled Outcome: Progressing   Problem: Safety: Goal: Ability to remain free from injury will improve Outcome: Progressing

## 2023-05-08 NOTE — Plan of Care (Signed)

## 2023-05-08 NOTE — Progress Notes (Signed)
 Patient states that she was awaken from sleep with a lot of chest pressure.  States she can feel her heart fluttering.  VSS.  HR 110-125.  Dr. Cherly Beach paged.  Patient states she has some nausea at this time as well.  Will hold zofran until after consulted with Dr. Cherly Beach due to prolonged QTC.

## 2023-05-09 ENCOUNTER — Other Ambulatory Visit (HOSPITAL_COMMUNITY): Payer: Self-pay

## 2023-05-09 DIAGNOSIS — Z952 Presence of prosthetic heart valve: Secondary | ICD-10-CM | POA: Diagnosis not present

## 2023-05-09 DIAGNOSIS — I4891 Unspecified atrial fibrillation: Secondary | ICD-10-CM | POA: Diagnosis not present

## 2023-05-09 DIAGNOSIS — I2510 Atherosclerotic heart disease of native coronary artery without angina pectoris: Secondary | ICD-10-CM | POA: Diagnosis not present

## 2023-05-09 LAB — BASIC METABOLIC PANEL
Anion gap: 8 (ref 5–15)
Anion gap: 10 (ref 5–15)
BUN: 22 mg/dL (ref 8–23)
BUN: 25 mg/dL — ABNORMAL HIGH (ref 8–23)
CO2: 24 mmol/L (ref 22–32)
CO2: 22 mmol/L (ref 22–32)
Calcium: 8.9 mg/dL (ref 8.9–10.3)
Calcium: 9 mg/dL (ref 8.9–10.3)
Chloride: 106 mmol/L (ref 98–111)
Chloride: 106 mmol/L (ref 98–111)
Creatinine, Ser: 1.37 mg/dL — ABNORMAL HIGH (ref 0.44–1.00)
Creatinine, Ser: 1.32 mg/dL — ABNORMAL HIGH (ref 0.44–1.00)
GFR, Estimated: 44 mL/min — ABNORMAL LOW (ref >60)
GFR, Estimated: 46 mL/min — ABNORMAL LOW (ref >60)
Glucose, Bld: 109 mg/dL — ABNORMAL HIGH (ref 70–99)
Glucose, Bld: 138 mg/dL — ABNORMAL HIGH (ref 70–99)
Potassium: 3.9 mmol/L (ref 3.5–5.1)
Potassium: 4 mmol/L (ref 3.5–5.1)
Sodium: 138 mmol/L (ref 135–145)
Sodium: 138 mmol/L (ref 135–145)

## 2023-05-09 LAB — HEPARIN LEVEL (UNFRACTIONATED): Heparin Unfractionated: <0.1 [IU]/mL — ABNORMAL LOW (ref 0.30–0.70)

## 2023-05-09 LAB — GLUCOSE, CAPILLARY
Glucose-Capillary: 95 mg/dL (ref 70–99)
Glucose-Capillary: 100 mg/dL — ABNORMAL HIGH (ref 70–99)
Glucose-Capillary: 85 mg/dL (ref 70–99)

## 2023-05-09 LAB — PROTIME-INR
INR: 3.6 — ABNORMAL HIGH (ref 0.8–1.2)
Prothrombin Time: 36 s — ABNORMAL HIGH (ref 11.4–15.2)

## 2023-05-09 LAB — BRAIN NATRIURETIC PEPTIDE: B Natriuretic Peptide: 114.1 pg/mL — ABNORMAL HIGH (ref 0.0–100.0)

## 2023-05-09 MED ORDER — ALPRAZOLAM 0.25 MG PO TABS
0.2500 mg | ORAL_TABLET | Freq: Every day | ORAL | 0 refills | Status: DC | PRN
  Filled 2023-05-09: qty 5, 5d supply, fill #0

## 2023-05-09 MED ORDER — WARFARIN SODIUM 2.5 MG PO TABS
2.5000 mg | ORAL_TABLET | Freq: Once | ORAL | Status: DC
  Filled 2023-05-09: qty 1

## 2023-05-09 MED ORDER — FUROSEMIDE 40 MG PO TABS
40.0000 mg | ORAL_TABLET | Freq: Every day | ORAL | 11 refills | Status: DC
  Filled 2023-05-09: qty 30, 30d supply, fill #0

## 2023-05-09 MED ORDER — SERTRALINE HCL 50 MG PO TABS
50.0000 mg | ORAL_TABLET | Freq: Every day | ORAL | 3 refills | Status: DC
  Filled 2023-05-09: qty 90, 90d supply, fill #0

## 2023-05-09 NOTE — Progress Notes (Signed)
 PHARMACY - ANTICOAGULATION CONSULT NOTE  Pharmacy Consult for warfarin  Indication: atrial fibrillation, MVR  Allergies  Allergen Reactions   Duloxetine Hcl Other (See Comments)    Behavioral changes, sleep disturbances   Iodinated Contrast Media Other (See Comments)    Patient is unsure of reaction type   Penicillins Other (See Comments)    Immune to drug , does not work per patient    Doxycycline Nausea And Vomiting    Vital Signs: Temp: 97.8 F (36.6 C) (03/21 0259) Temp Source: Oral (03/21 0259) BP: 112/77 (03/21 0259) Pulse Rate: 99 (03/21 0259) Heparin weight: 77.8 kg  Labs: Recent Labs    05/07/23 0243 05/07/23 1026 05/08/23 0237 05/09/23 0225  LABPROT 29.9*  --  33.9* 36.0*  INR 2.8*  --  3.3* 3.6*  HEPARINUNFRC 0.36 0.39 0.35 <0.10*  CREATININE 1.55*  --  1.23* 1.37*    Estimated Creatinine Clearance: 48.3 mL/min (A) (by C-G formula based on SCr of 1.37 mg/dL (H)).   Medical History: Past Medical History:  Diagnosis Date   Allergy ?   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 year old female presented on 05/05/2023 with concern for chest pain. On warfarin prior to admission for afib and MVR.  Rheumatic heart disease s/p mMVR/mAVR (01/01/17)  Last warfarin dose PTA 3/16 at 2100.  PTA dose: warfarin 5mg  daily, except 7.5 Sunday and Friday (total weekly dose 40 mg).   Pharmacy consulted for warfarin inpatient management and IV heparin bridge while subtherapeutic INR.   Now off heparin, INR 3.6 upper end therapeutic, did receive 12.5 mg 3/19  Goal of Therapy:  INR goal: 2.5-3.5 Monitor platelets by anticoagulation protocol: Yes   Plan:  Warfarin 2.5 mg PO x 1 today Daily INR, s/s bleeding  Daylene Posey, PharmD, [facility] Clinical Pharmacist ED Pharmacist Phone # [phone] 05/09/2023 7:20 AM

## 2023-05-09 NOTE — Progress Notes (Signed)
 Mobility Specialist Progress Note;   05/09/23 1040  Mobility  Activity Ambulated with assistance in hallway  Level of Assistance Standby assist, set-up cues, supervision of patient - no hands on  Assistive Device None  Distance Ambulated (ft) 300 ft  Activity Response Tolerated well  Mobility Referral Yes  Mobility visit 1 Mobility  Mobility Specialist Start Time (ACUTE ONLY) 1040  Mobility Specialist Stop Time (ACUTE ONLY) 1050  Mobility Specialist Time Calculation (min) (ACUTE ONLY) 10 min    During-mobility: HR 95-115 bpm Post-mobility: HR 97 bpm  Pt eager for mobility. Required no physical assistance during ambulation, SV. HR fluctuated throughout ambulation, 95-115 bpm. Still c/o chest tightness. Pt returned back to bed with all needs met. Asked to ambulate again this afternoon.   Kimberly Hobbs Mobility Specialist Please contact via SecureChat or [employer] [phone]

## 2023-05-09 NOTE — Consult Note (Signed)
 Value-Based Care [facility] Liaison Consult Note   05/09/2023  Kimberly Hobbs [birth year] [id number]  Insurance:  [employer]  Primary Care Provider: Pcp, No, patient states she is seeing, Abner Greenspan, at [facility] this provider is listed for the transition of care follow up appointments.   [facility] Liaison met patient at bedside at [facility].  Patient ready to transition home and going to DC lounge. Explained reason for visit.  Patient states she needs a provider for prescribing her Klonopin.   The patient was screened for  day readmission hospitalization with noted high risk score for unplanned readmission risk 2 hospital admissions in 6 months.  The patient was assessed for potential [facility] Coordination service needs for post hospital transition for care coordination. Review of patient's electronic medical record reveals patient is admitted with Atrial Fib with RVR.   Plan: [facility] Liaison will continue to follow progress and disposition to assess for post hospital community care coordination/management needs.  Referral request for community care coordination: Will refer for post hospital follow up for care coordination for readmission prevention needs.   [facility] Community Care, Population Health does not replace or interfere with any arrangements made by the Inpatient Transition of Care team.   For questions contact:   Charlesetta Shanks, RN, BSN, CCM [employer]  [facility], [facility] Liaison Direct Dial: [phone] or secure chat Email: [email or web address]

## 2023-05-09 NOTE — TOC Transition Note (Signed)
 Transition of Care [facility]) - Discharge Note   Patient Details  Name: Kimberly Hobbs MRN: [id number] Date of Birth: Aug 01, 1960  Transition of [facility]) CM/SW Contact:  Gordy Clement, RN Phone Number: 05/09/2023, 12:05 PM   Clinical Narrative:     Patient will DC to home today. No TOC needs have been identified. Patient will follow up as directed on AVS.  Family will transport        Barriers to Discharge: Continued Medical Work up   Patient Goals and CMS Choice            Discharge Placement                       Discharge Plan and Services Additional resources added to the After Visit Summary for     Discharge Planning Services: CM Consult                                 Social Drivers of Health (SDOH) Interventions SDOH Screenings   Food Insecurity: No Food Insecurity (05/06/2023)  Housing: Low Risk  (05/06/2023)  Transportation Needs: No Transportation Needs (05/06/2023)  Utilities: Not At Risk (05/06/2023)  Alcohol Screen: Low Risk  (10/10/2021)  Depression (PHQ2-9): High Risk (04/14/2023)  Financial Resource Strain: Low Risk  (10/14/2022)  Physical Activity: Inactive (10/14/2022)  Social Connections: Moderately Isolated (10/14/2022)  Stress: Stress Concern Present (10/14/2022)  Tobacco Use: Medium Risk (05/05/2023)  Health Literacy: Adequate Health Literacy (10/14/2022)     Readmission Risk Interventions     No data to display

## 2023-05-09 NOTE — Progress Notes (Signed)
 Progress Note  Patient Name: Kimberly Hobbs Date of Encounter: 05/09/2023  Primary Cardiologist: Thomasene Ripple, DO   Subjective   Pt comfortable laying in bed  WHen sleeping sats go to 88%    Says she got anxious last night   Pressure chest to back  Xanax helpd    Inpatient Medications    Scheduled Meds:  amiodarone  200 mg Oral BID   aspirin EC  81 mg Oral Daily   aspirin  325 mg Oral Once   clonazePAM  0.5 mg Oral QHS   umeclidinium bromide  1 puff Inhalation Daily   And   fluticasone furoate-vilanterol  1 puff Inhalation Daily   gabapentin  100 mg Oral QHS   insulin aspart  0-5 Units Subcutaneous QHS   insulin aspart  0-9 Units Subcutaneous TID WC   isosorbide mononitrate  60 mg Oral Daily   metoprolol tartrate  25 mg Oral Q6H   montelukast  10 mg Oral Daily   nitroGLYCERIN  0.4 mg Sublingual Once   pantoprazole  40 mg Oral BID   ramelteon  8 mg Oral QHS   rosuvastatin  40 mg Oral Daily   sacubitril-valsartan  1 tablet Oral BID   sertraline  50 mg Oral QHS   warfarin  2.5 mg Oral ONCE-1600   Warfarin - Pharmacist Dosing Inpatient   Does not apply q1600   Continuous Infusions:   PRN Meds: acetaminophen, albuterol, nitroGLYCERIN, ondansetron (ZOFRAN) IV   Vital Signs    Vitals:   05/08/23 2346 05/09/23 0259 05/09/23 0500 05/09/23 0718  BP: 128/81 112/77  122/70  Pulse: 96 99  93  Resp: 15 (!) 23  18  Temp:  97.8 F (36.6 C)  97.7 F (36.5 C)  TempSrc: Oral Oral  Axillary  SpO2: 98% 93%  93%  Weight:   92.1 kg   Height:        Intake/Output Summary (Last 24 hours) at 05/09/2023 0722 Last data filed at 05/09/2023 0300 Gross per 24 hour  Intake 1098.87 ml  Output 1650 ml  Net -551.13 ml   Filed Weights   05/07/23 0428 05/08/23 0348 05/09/23 0500  Weight: 90.5 kg 91.4 kg 92.1 kg    Telemetry    Afib rates 70s to 100s - Personally Reviewed  ECG     - Personally Reviewed  Physical Exam   General: Comfortable Neck  No JVD   Cardiac:  Normal S1, S2;   irreg irreg   Crisp valve sounds  Soft systolic murmur  Chest   TEnder to palpation   Lungs: Clear to auscultation Abd: Soft, nontender, no hepatomegaly  Ext: warm, no edema  Labs    Chemistry Recent Labs  Lab 05/06/23 0424 05/07/23 0243 05/08/23 0237 05/09/23 0225  NA 136 138 138 138  K 3.7 4.0 3.6 3.9  CL 100 102 104 106  CO2 21* 27 22 24   GLUCOSE 102* 119* 141* 109*  BUN 28* 29* 25* 22  CREATININE 1.50* 1.55* 1.23* 1.37*  CALCIUM 9.2 9.1 9.0 8.9  PROT 7.9  --   --   --   ALBUMIN 4.3  --   --   --   AST 38  --   --   --   ALT 56*  --   --   --   ALKPHOS 89  --   --   --   BILITOT 0.7  --   --   --   GFRNONAA 39* 38*  50* 44*  ANIONGAP 15 9 12 8      Hematology Recent Labs  Lab 05/05/23 2250 05/06/23 0045  WBC 13.5* 14.2*  RBC 5.28* 5.12*  HGB 13.6 13.0  HCT 43.3 41.4  MCV 82.0 80.9  MCH 25.8* 25.4*  MCHC 31.4 31.4  RDW 16.1* 16.0*  PLT 377 360    Cardiac EnzymesNo results for input(s): "TROPONINI" in the last 168 hours. No results for input(s): "TROPIPOC" in the last 168 hours.   BNP Recent Labs  Lab 05/06/23 0045  BNP 101.7*     DDimer No results for input(s): "DDIMER" in the last 168 hours.     Cardiac Studies   TEE   3.19.25  No report  Cardiac MRI  Dec 18/ 2024     -Mild hypertrophy  -Normal size -Mild systolic dysfunction  Normal ECV (23%)  Normal T2 values  -RV insertion site LGE  LV EF:  40% (Normal 52-79%)   Absolute volumes:  LV EDV: 98mL (Normal 78-167 mL)  LV ESV: 58mL (Normal 21-64 mL)  LV SV: 39mL (Normal 52-114 mL)  CO: 4.1L/min (Normal 2.7-6.3 L/min)   Indexed volumes:   LV EDV: 26mL/sq-m (Normal 50-96 mL/sq-m)  LV ESV: 31mL/sq-m (Normal 10-40 mL/sq-m)  LV SV: 41mL/sq-m (Normal 33-64 mL/sq-m)  CI: 2.0L/min/sq-m (Normal 1.9-3.9 L/min/sq-m)  Right ventricle:  -Normal size  -Moderate systolic dysfunction.  Regional RV dyskinesis near apex  RV EF: 39% (Normal 52-80%)   Absolute volumes:  RV EDV:  (Normal 79-175 mL)  RV ESV: 75mL (Normal 13-75 mL)  RV SV: 48mL (Normal 56-110 mL)  CO: 5.0L/min (Normal 2.7-6 L/min)   Indexed volumes:   RV EDV: 6mL/sq-m (Normal 51-97 mL/sq-m) RV ESV: 21mL/sq-m (Normal 9-42 mL/sq-m)  RV SV: 66mL/sq-m (Normal 35-61 mL/sq-m)  CI: 2.4 L/min/sq-m (Normal 1.8-3.8 L/min/sq-m)   Left atrium: Mild enlargement  Right atrium: Normal size  Mitral valve: S/p mechanical MVR.  Mild regurgitation  Aortic valve: S/p mechanical AVR. Trivial regurgitation  Tricuspid valve: Mild regurgitation  Pulmonic valve: Trivial regurgitation  Aorta: Normal proximal ascending aorta  Pulmonary artery: Dilated main pulmonary artery measuring 33mm   Pericardium: Normal   IMPRESSION: 1. Normal RV size with moderate systolic dysfunction (EF 39%). There is regional RV dyskinesis near apex. Given regional RV dyskinesis and RVEF <40%, would meet 1 major imaging criteria for ARVC (though no RV dilatation)   2.  Normal LV size with mild systolic dysfunction (EF 40%)   3. RV insertion site LGE, which is a nonspecific scar finding often seen in setting of elevated pulmonary pressures   4.  Dilated main pulmonary artery measuring 33mm   Echo 02/03/23  1. Left ventricular ejection fraction, by estimation, is 55 to 60%. The  left ventricle has normal function. The left ventricle has no regional  wall motion abnormalities. Left ventricular diastolic parameters are  indeterminate.   2. Right ventricular systolic function is mildly reduced. The right  ventricular size is normal.   3. Left atrial size was mildly dilated.   4. The mitral valve has been repaired/replaced. No evidence of mitral  valve regurgitation. No evidence of mitral stenosis. The mean mitral valve  gradient is 3.0 mmHg. Echo findings are consistent with normal structure  and function of the mitral valve  prosthesis.   5. The aortic valve has been repaired/replaced. There is moderate  calcification of  the aortic valve. Aortic valve regurgitation is not  visualized. Mild aortic valve stenosis. Aortic valve area, by VTI measures  1.34 cm. Aortic valve mean gradient  measures 19.5 mmHg. Aortic valve Vmax measures 3.00 m/s.   6. The inferior vena cava is normal in size with greater than 50%  respiratory variability, suggesting right atrial pressure of 3 mmHg.   Conclusion(s)/Recommendation(s): Gradient through mechanical aortic valve  mildly elevated but no change since echo 2/24.   Patient Profile     63 y.o. 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   Assessment & Plan     Atrial fibrillation  Pt presented in afib with RVR     TEE  showed very small thrombus in LA  Did not have cardioversion    Plan for Rate control  and coumadin  Close follow up of INR as outpt and cardioversion in 3 wks  Keep on amiodarone 200 bid    Follow HR  Again,  I do not think with this dose she would cardiovert to SR  Heart rates controlled at rest    Need to ambulate to make sure no severe elevation  Expect some       Anticipte that she can go home today      CAD    s/p CABG   Trivial elevation of trop  Most likely demand in setting of afib with RVR   Chest pain  She is having some chest pains but I think this is more musculoskeletal, not cardiac  SHe is also quite anxious at ttimes  Poss short course xanax  S/P MVR/AVR   INR today 3.3 today   Keep on coumadin     HFmrEF    Echo in Dec reported normal   MRI reported LVEF down   Will access TEE images and review  Clinically appears to be euvolemic  Labs today BNP 114      HL   Continue Crestor  DM - continue insulin    For questions or updates, please contact [facility] Please consult  [email or web address] for contact info under Cardiology/STEMI.      Signed, Dietrich Pates, MD  05/09/2023, 7:22 AM

## 2023-05-09 NOTE — Discharge Summary (Signed)
 Discharge Summary    Patient ID: Kimberly Hobbs MRN: [id number]; DOB: 03-Apr-1960  Admit date: 05/05/2023 Discharge date: 05/09/2023  PCP:  Oneita Hurt, No   [facility] Providers Cardiologist:  Thomasene Ripple, DO  Electrophysiologist:  [name], MD  Sleep Medicine:  Armanda Magic, MD  {  Discharge Diagnoses    Principal Problem:   Atrial fibrillation with RVR [facility]) Active Problems:   Essential hypertension   H/O heart valve replacement with mechanical valve   Coronary artery disease of bypass graft of native heart with stable angina pectoris (HCC)   DM (diabetes mellitus) (HCC)    Diagnostic Studies/Procedures    TEE 05/06/2023 . Left ventricular ejection fraction, by estimation, is 55 to 60%. The  left ventricle has normal function. Left ventricular diastolic function  could not be evaluated.   2. Right ventricular systolic function is low normal. The right  ventricular size is normal.   3. Very small mobile thrombus in the presence of the left atrial  appendage with smoke. Left atrial size was moderately dilated. A left  atrial/left atrial appendage thrombus was detected. The LAA emptying  velocity was 22 cm/s.   4. Right atrial size was mildly dilated.   5. Mild mitral valve regurgitation.   _____________   History of Present Illness     Kimberly Hobbs is a 63 y.o. 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  Patient has cardiac catheterization in 2020 showing patent grafts.  She has history of persistent atrial fibrillation status post ablation with PVI in July 2024 unfortunately has had  recurrence of her atrial fibrillation status post ablation now on amiodarone.  Also with recurrence of VT in December 2024, EP study during that time showing noninducible VT not considered for ablation given mechanical valve.  Surface echocardiogram on 12/16 demonstrated preserved EF but then had cardiac MRI 2 days later that showed EF 39%, she had ICD placed during that admission.  Underwent DCCV again in February 2025 with return to NSR.  Since then she has had had recurrence early recurrence of atrial fibrillation.  She presented to the emergency room due to complaints of palpitations that began 3/14.  Reportedly primary cardiologist instructed her to increase her amiodarone from 200 mg daily to 200 mg twice daily.  She had ongoing symptoms with the addition of chest pressure and accompanied nausea.  She called outpatient service line and fellow had recommended emergency evaluation.  She reported good adherence to her medication although has had subtherapeutic INRs intermittently.   Hospital Course     Consultants:    Persistent atrial fibrillation -Status post ablation 08/2022 -DCCV 03/2023 Unfortunately has had early recurrence.  Plan was initially for TEE/DCCV however TEE showing small LAA thrombus.  Case aborted. She has had intermittent subtherapeutic INRs although reports compliancy with her warfarin. Continue amiodarone 200 mg twice daily, can resume her home Toprol XL 100mg  BID.  With underlying pulmonary disease may consider switching to bisoprolol. Discussed with Dr. Tenny Craw continue okay to continue warfarin with INR goal 2.5-3.5  CAD status post CABG x2 2007 Stable disease, see below about chest pain. Continue to assess outpatient Continue with  aspirin, Imdur 60 mg, Toprol-XL as above, rosuvastatin 40 mg  Heart failure with recovered EF 12/16 had preserved biventricular function.  Had cardiac MRI 12/18 EF 39%.  TEE showing EF 55 to 60%, not sure what to make of of this discrepancy.   Consider repeating echocardiogram outpatient if indicated. Continue Entresto 24-26 mg twice daily, Toprol-XL 100 mg twice daily, increasing Lasix to 40 mg as needed as she reports the 20 mg does not help.  Did not need lasix here. Can take her supplementation potassium when she takes this.  History of VT status post ICD No recurrences on most recent device check.  Amio and beta-blocker as above.  Status post AVR/MVR 2018 Stable on echocardiogram in December 2024.  Continue with warfarin, follows with coumadin clinic.  OSA Reports intermittent adherence with CPAP, encourage use as this is a significant risk factor for her atrial fibrillation.  Severe anxiety Here she was getting Ativan and reports this is the only thing that has helped her chest pain.  Supporting the case that her chest pain likely is anxiety induced.  Per psychiatrist just increased her sertraline to 50 mg daily although she was not taking this.  Reports Klonopin does not work for her any longer.  Reports that she struggling to find other people to fill her prescriptions.  Very reluctantly I will prescribe her Xanax 0.25 mg, only 5 tablets until she can follow-up with psychiatry/PCP.  I discussed with her extensively that I will not be the prescriber of this and will not be offering her any other refills.  She needs to follow-up with psychiatrist promptly.  Patient seen and examined by myself and Dr. Tenny Craw and deemed stable for discharge.  Medications will be sent to our [facility] pharmacy.  She has very close follow-up with her primary cardiologist as well as EP on 3/26.   She also has very close follow-up with Coumadin clinic.    Did the patient have an acute coronary syndrome (MI, NSTEMI, STEMI, etc) this admission?:  No                               Did the patient have a percutaneous coronary intervention (stent / angioplasty)?:  No.          _____________  Discharge Vitals Blood pressure 122/70, pulse 93, temperature 97.7  F (36.5 C), temperature source Axillary, resp. rate 18, height 5\' 5"  (1.651 m), weight 92.1 kg, last menstrual period 06/20/2015, SpO2 93%.  Filed Weights   05/07/23 0428 05/08/23 0348 05/09/23 0500  Weight: 90.5 kg 91.4 kg 92.1 kg    Labs & Radiologic Studies    CBC No results for input(s): "WBC", "NEUTROABS", "HGB", "HCT", "MCV", "PLT" in the last 72 hours. Basic Metabolic Panel Recent Labs    82/95/62 0243 05/08/23 0237 05/09/23 0225 05/09/23 0810  NA 138   < > 138 138  K 4.0   < > 3.9 4.0  CL 102   < > 106 106  CO2 27   < > 24 22  GLUCOSE 119*   < > 109* 138*  BUN 29*   < > 22 25*  CREATININE 1.55*   < > 1.37* 1.32*  CALCIUM 9.1   < > 8.9 9.0  MG 2.3  --   --   --    < > = values in this interval not displayed.   Liver Function Tests No results for input(s): "AST", "  ALT", "ALKPHOS", "BILITOT", "PROT", "ALBUMIN" in the last 72 hours. No results for input(s): "LIPASE", "AMYLASE" in the last 72 hours. High Sensitivity Troponin:   Recent Labs  Lab 05/05/23 2250 05/06/23 0045 05/06/23 0424  TROPONINIHS 12 21* 31*    BNP Invalid input(s): "POCBNP" D-Dimer No results for input(s): "DDIMER" in the last 72 hours. Hemoglobin A1C No results for input(s): "HGBA1C" in the last 72 hours. Fasting Lipid Panel No results for input(s): "CHOL", "HDL", "LDLCALC", "TRIG", "CHOLHDL", "LDLDIRECT" in the last 72 hours. Thyroid Function Tests No results for input(s): "TSH", "T4TOTAL", "T3FREE", "THYROIDAB" in the last 72 hours.  Invalid input(s): "FREET3" _____________  ECHO TEE Result Date: 05/06/2023    TRANSESOPHOGEAL ECHO REPORT   Patient Name:   ROZETTA STUMPP Date of Exam: 05/06/2023 Medical Rec #:  [id number]          Height:       65.0 in Accession #:    [id number]         Weight:       205.0 lb Date of Birth:  1960/05/03           BSA:          1.999 m Patient Age:    62 years           BP:           178/141 mmHg Patient Gender: F                  HR:           96 bpm.  Exam Location:  Inpatient Procedure: Cardiac Doppler, Color Doppler and Transesophageal Echo (Both            Spectral and Color Flow Doppler were utilized during procedure). Indications:    Cardioversion  History:        Patient has prior history of Echocardiogram examinations, most                 recent 02/03/2023.  Sonographer:    Harriette Bouillon RDCS Referring Phys: [id number] Karl Ito PROCEDURE: After discussion of the risks and benefits of a TEE, an informed consent was obtained. The transesophogeal probe was passed without difficulty through the esophogus of the patient. The patient was monitored while under deep sedation. Anesthestetic sedation was provided intravenously by Anesthesiology: 300mg  of Propofol, 40mg  of Lidocaine.  IMPRESSIONS  1. Left ventricular ejection fraction, by estimation, is 55 to 60%. The left ventricle has normal function. Left ventricular diastolic function could not be evaluated.  2. Right ventricular systolic function is low normal. The right ventricular size is normal.  3. Very small mobile thrombus in the presence of the left atrial appendage with smoke. Left atrial size was moderately dilated. A left atrial/left atrial appendage thrombus was detected. The LAA emptying velocity was 22 cm/s.  4. Right atrial size was mildly dilated.  5. Mild mitral valve regurgitation. FINDINGS  Left Ventricle: Left ventricular ejection fraction, by estimation, is 55 to 60%. The left ventricle has normal function. The left ventricular internal cavity size was normal in size. Left ventricular diastolic function could not be evaluated due to mitral valve replacement. Left ventricular diastolic function could not be evaluated. Right Ventricle: The right ventricular size is normal. No increase in right ventricular wall thickness. Right ventricular systolic function is low normal. Left Atrium: Very small mobile thrombus in the presence of the left atrial appendage with smoke. Left atrial size was  moderately dilated. A left  atrial/left atrial appendage thrombus was detected. The LAA emptying velocity was 22 cm/s. Right Atrium: Right atrial size was mildly dilated. Mitral Valve: Mild mitral valve regurgitation, with two seperate jets. Aorta: There is minimal (Grade I) layered plaque involving the aortic root. IAS/Shunts: No atrial level shunt detected by color flow Doppler. Thomasene Ripple DO Electronically signed by Thomasene Ripple DO Signature Date/Time: 05/06/2023/6:14:19 PM    Final    EP STUDY Result Date: 05/06/2023 See surgical note for result.  DG Chest Port 1 View Result Date: 05/05/2023 CLINICAL DATA:  Chest pain, AFib EXAM: PORTABLE CHEST 1 VIEW COMPARISON:  04/16/2023 FINDINGS: Stable cardiomegaly. Left chest wall ICD. AVR. Sternotomy and CABG. Aortic atherosclerotic calcification. Diffuse interstitial coarsening compatible with edema. No focal consolidation, pleural effusion, or pneumothorax. IMPRESSION: Cardiomegaly with interstitial edema. Electronically Signed   By: Minerva Fester M.D.   On: 05/05/2023 22:54   DG Chest 2 View Result Date: 04/16/2023 CLINICAL DATA:  Cough and shortness of breath.  Chronic CHF. EXAM: CHEST - 2 VIEW COMPARISON:  Chest radiograph dated 03/21/2023. FINDINGS: No focal consolidation, pleural effusion, or pneumothorax. Mild cardiomegaly. The pectoral AICD device. Median sternotomy wires and CABG vascular clips. Atherosclerotic calcification of the aorta. No acute osseous pathology. IMPRESSION: 1. No active cardiopulmonary disease. 2. Mild cardiomegaly. Electronically Signed   By: Elgie Collard M.D.   On: 04/16/2023 16:57   Disposition   Pt is being discharged home today in good condition.  Follow-up Plans & Appointments     Follow-up Information     Tobb, Kardie, DO Follow up.   Specialty: Cardiology Why: 05/13/2023 at 7:10 AM. Contact information: [address] [place] [address] [phone]         Regan Lemming, MD  Follow up.   Specialty: Cardiology Why: 05/14/2023 at 2:00 PM Contact information: [address] [place] [address] [phone]                Discharge Instructions     Amb referral to AFIB Clinic   Complete by: As directed         Discharge Medications   Allergies as of 05/09/2023       Reactions   Duloxetine Hcl Other (See Comments)   Behavioral changes, sleep disturbances   Iodinated Contrast Media Other (See Comments)   Patient is unsure of reaction type   Penicillins Other (See Comments)   Immune to drug , does not work per patient   Doxycycline Nausea And Vomiting        Medication List     STOP taking these medications    ramelteon 8 MG tablet Commonly known as: ROZEREM       TAKE these medications    Accu-Chek Softclix Lancets lancets Use as instructed   acetaminophen 325 MG tablet Commonly known as: TYLENOL Take 2 tablets (650 mg total) by mouth every 4 (four) hours as needed for headache or mild pain (pain score 1-3).   albuterol 108 (90 Base) MCG/ACT inhaler Commonly known as: VENTOLIN HFA Inhale 1-2 puffs into the lungs every 6 (six) hours as needed for wheezing or shortness of breath.   ALPRAZolam 0.25 MG tablet Commonly known as: XANAX Take 1 tablet (0.25 mg total) by mouth daily as needed for anxiety   amiodarone 200 MG tablet Commonly known as: PACERONE Take 1 tablet (200 mg total) by mouth 2 (two) times daily.   aspirin EC 81 MG tablet Take 81 mg by mouth  daily.   BLOOD GLUCOSE TEST STRIPS Strp 1 each by In Vitro route in the morning, at noon, and at bedtime. May substitute to any manufacturer covered by patient's insurance.   Breztri Aerosphere 160-9-4.8 MCG/ACT Aero Generic drug: budeson-glycopyrrolate-formoterol Inhale 2 puffs into the lungs in the morning and at bedtime.   clonazePAM 0.5 MG tablet Commonly known as: KLONOPIN Take 1.5 tablets (0.75 mg total) by mouth at bedtime.   Entresto 24-26  MG Generic drug: sacubitril-valsartan Take 1 tablet by mouth 2 (two) times daily.   furosemide 40 MG tablet Commonly known as: Lasix Take 1 tablet (40 mg total) by mouth daily. What changed:  medication strength how much to take when to take this   gabapentin 100 MG capsule Commonly known as: NEURONTIN Take 1 capsule (100 mg total) by mouth at bedtime.   guaiFENesin-codeine 100-10 MG/5ML syrup Take 5 mLs by mouth every 6 (six) hours as needed for cough.   isosorbide mononitrate 60 MG 24 hr tablet Commonly known as: IMDUR Take 1 tablet (60 mg total) by mouth daily.   magnesium oxide 400 (240 Mg) MG tablet Commonly known as: MAG-OX Take 1 tablet (400 mg total) by mouth daily.   metFORMIN 500 MG tablet Commonly known as: GLUCOPHAGE Take 1 tablet (500 mg total) by mouth 2 (two) times daily with a meal.   metoprolol succinate 100 MG 24 hr tablet Commonly known as: TOPROL-XL Take 100 mg by mouth 2 (two) times daily.   montelukast 10 MG tablet Commonly known as: SINGULAIR Take 1 tablet (10 mg total) by mouth daily.   ONE TOUCH ULTRA 2 w/Device Kit 3 (three) times daily.   oxyCODONE-acetaminophen 5-325 MG tablet Commonly known as: Percocet Take 1 tablet by mouth every 12 (twelve) hours as needed for severe pain (pain score 7-10).   pantoprazole 40 MG tablet Commonly known as: PROTONIX Take 1 tablet (40 mg total) by mouth 2 (two) times daily.   potassium chloride SA 20 MEQ tablet Commonly known as: KLOR-CON M Take 1 tablet (20 mEq total) by mouth daily. Take 2 tablets once at dinner 03/26/23 and then 1 tablet daily starting 03/27/23 What changed: additional instructions   rosuvastatin 40 MG tablet Commonly known as: CRESTOR TAKE 1 TABLET(40 MG) BY MOUTH DAILY   sertraline 50 MG tablet Commonly known as: ZOLOFT Take 1 tablet (50 mg total) by mouth at bedtime. What changed:  how much to take how to take this when to take this additional instructions   warfarin 7.5  MG tablet Commonly known as: COUMADIN Take as directed. If you are unsure how to take this medication, talk to your nurse or doctor. Original instructions: Take 7.5 mg by mouth See admin instructions. Take 1 tablet by mouth every Fri and Sun   warfarin 5 MG tablet Commonly known as: COUMADIN Take as directed. If you are unsure how to take this medication, talk to your nurse or doctor. Original instructions: Take 1 tablet (5 mg total) by mouth 4 (four) times a week. Sat, Tues, Thurs, and Sun           Outstanding Labs/Studies    Duration of Discharge Encounter: APP Time: 60 minutes   Signed, Abagail Kitchens, PA-C 05/09/2023, 11:59 AM

## 2023-05-10 ENCOUNTER — Other Ambulatory Visit (HOSPITAL_COMMUNITY): Payer: Self-pay

## 2023-05-11 LAB — CULTURE, BLOOD (ROUTINE X 2)
Culture: NO GROWTH
Special Requests: ADEQUATE

## 2023-05-12 ENCOUNTER — Telehealth: Payer: Self-pay | Admitting: *Deleted

## 2023-05-12 NOTE — Transitions of Care (Post Inpatient/ED Visit) (Signed)
 05/12/2023  Name: Kimberly Hobbs MRN: [id number] DOB: [birth year]  Today's TOC FU Call Status: Today's TOC FU Call Status:: Successful TOC FU Call Completed TOC FU Call Complete Date: 05/12/23 Patient's Name and Date of Birth confirmed.  Transition Care Management Follow-up Telephone Call Date of Discharge: 05/09/23 Discharge Facility: [facility]) Type of Discharge: Inpatient Admission Primary Inpatient Discharge Diagnosis:: Atrial fibrillation with RVR How have you been since you were released from the hospital?: Same Any questions or concerns?: Yes Patient Questions/Concerns:: Patient stated the pharmacy had said her metoprolol had been c/cd and she wanted to know why. Patient Questions/Concerns Addressed: Other: (Per D/cd summary it had not been d/cd and the Dr notes. Patient has some at home and will continue to take them and get a new order from the cardiologist on 03/26)  Items Reviewed: Did you receive and understand the discharge instructions provided?: Yes Medications obtained,verified, and reconciled?: Yes (Medications Reviewed) Any new allergies since your discharge?: No Dietary orders reviewed?: Yes Type of Diet Ordered:: low sodium heart healthy Do you have support at home?: Yes People in Home: significant other Name of Support/Comfort Primary Source: [name]  Medications Reviewed Today: Medications Reviewed Today     Reviewed by [name], RN (Case Manager) on 05/12/23 at [phone]  Med List Status: <None>   Medication Order Taking? Sig Documenting Provider Last Dose Status Informant  Accu-Chek Softclix Lancets lancets [id number] Yes Use as instructed  Patient taking differently: 1 each by Other route See admin instructions. Use as instructed   Nche, [name], NP Taking Active Self  acetaminophen (TYLENOL) 325 MG tablet [id number]  Take 2 tablets (650 mg total) by mouth every 4 (four) hours as needed for headache or mild pain (pain score 1-3).  [name], PA-C  Active Self  albuterol (VENTOLIN HFA) 108 (90 Base) MCG/ACT inhaler [id number] No Inhale 1-2 puffs into the lungs every 6 (six) hours as needed for wheezing or shortness of breath.  Patient not taking: Reported on 05/06/2023   [provider] Not Taking Active Self  ALPRAZolam Prudy Feeler) 0.25 MG tablet [id number] Yes Take 1 tablet (0.25 mg total) by mouth daily as needed for anxiety Abagail Kitchens, PA-C Taking Active   amiodarone (PACERONE) 200 MG tablet [id number] Yes Take 1 tablet (200 mg total) by mouth 2 (two) times daily. [name], [name], PA Taking Active Self  aspirin EC 81 MG tablet [id number] Yes Take 81 mg by mouth daily.  [provider] Taking Active Self  Blood Glucose Monitoring Suppl (ONE TOUCH ULTRA 2) w/Device KIT [id number] Yes 3 (three) times daily. [provider] Taking Active Self  Budeson-Glycopyrrol-Formoterol (BREZTRI AEROSPHERE) 160-9-4.8 MCG/ACT AERO [id number] Yes Inhale 2 puffs into the lungs in the morning and at bedtime. [name], NP Taking Active Self  clonazePAM (KLONOPIN) 0.5 MG tablet [id number] Yes Take 1.5 tablets (0.75 mg total) by mouth at bedtime. [name], PA Taking Active Self  furosemide (LASIX) 40 MG tablet [id number] Yes Take 1 tablet (40 mg total) by mouth daily. Abagail Kitchens, PA-C Taking Active   gabapentin (NEURONTIN) 100 MG capsule [id number] Yes Take 1 capsule (100 mg total) by mouth at bedtime. [name], MD Taking Active Self  Glucose Blood (BLOOD GLUCOSE TEST STRIPS) STRP [id number] Yes 1 each by In Vitro route in the morning, at noon, and at bedtime. May substitute to any manufacturer covered by patient's insurance. [name], PA Taking Active Self  guaiFENesin-codeine 100-10  MG/5ML syrup [id number] Yes Take 5 mLs by mouth every 6 (six) hours as needed for cough. [name], MD Taking Active Self  isosorbide mononitrate (IMDUR) 60 MG 24 hr tablet [id number]  Take 1  tablet (60 mg total) by mouth daily. [name], [name], MD  Active Self  magnesium oxide (MAG-OX) 400 (240 Mg) MG tablet [id number] Yes Take 1 tablet (400 mg total) by mouth daily. [name], PA-C Taking Active Self  metFORMIN (GLUCOPHAGE) 500 MG tablet [id number] Yes Take 1 tablet (500 mg total) by mouth 2 (two) times daily with a meal. [name], PA Taking Active Self  metoprolol succinate (TOPROL-XL) 100 MG 24 hr tablet [id number] Yes Take 100 mg by mouth 2 (two) times daily. [provider] Taking Active Self  montelukast (SINGULAIR) 10 MG tablet [id number] Yes Take 1 tablet (10 mg total) by mouth daily. Charlott Holler, MD Taking Active Self  oxyCODONE-acetaminophen (PERCOCET) 5-325 MG tablet [id number] Yes Take 1 tablet by mouth every 12 (twelve) hours as needed for severe pain (pain score 7-10). [name], MD Taking Active Self           Med Note ([name], [name]   Tue May 06, 2023  1:30 AM)    pantoprazole (PROTONIX) 40 MG tablet [id number] Yes Take 1 tablet (40 mg total) by mouth 2 (two) times daily. [name], PA Taking Active Self  potassium chloride SA (KLOR-CON M) 20 MEQ tablet [id number] Yes Take 1 tablet (20 mEq total) by mouth daily. Take 2 tablets once at dinner 03/26/23 and then 1 tablet daily starting 03/27/23  Patient taking differently: Take 20 mEq by mouth daily.   [name], MD Taking Active Self  rosuvastatin (CRESTOR) 40 MG tablet [id number] Yes TAKE 1 TABLET(40 MG) BY MOUTH DAILY [name], [name], MD Taking Active Self  sacubitril-valsartan (ENTRESTO) 24-26 MG [id number] Yes Take 1 tablet by mouth 2 (two) times daily. [name], PA-C Taking Active Self  sertraline (ZOLOFT) 50 MG tablet [id number] Yes Take 1 tablet (50 mg total) by mouth at bedtime. Abagail Kitchens, PA-C Taking Active   warfarin (COUMADIN) 5 MG tablet [id number] Yes Take 1 tablet (5 mg total) by mouth 4 (four) times a week. Sat, Tues, Thurs, and Sun  Turner, Traci R, MD Taking Active Self  warfarin (COUMADIN) 7.5 MG tablet [id number] Yes Take 7.5 mg by mouth See admin instructions. Take 1 tablet by mouth every Fri and Sun [provider] Taking Active Self            Home Care and Equipment/Supplies: Were Home Health Services Ordered?: NA Any new equipment or medical supplies ordered?: NA  Functional Questionnaire: Do you need assistance with bathing/showering or dressing?: No Do you need assistance with meal preparation?: No Do you need assistance with eating?: No Do you have difficulty maintaining continence: No Do you need assistance with getting out of bed/getting out of a chair/moving?: No Do you have difficulty managing or taking your medications?: No  Follow up appointments reviewed: PCP Follow-up appointment confirmed?: NA (patient is looking for new PCP) Specialist Hospital Follow-up appointment confirmed?: Yes Date of Specialist follow-up appointment?: 05/14/23 Follow-Up Specialty Provider:: Dr Octavia Bruckner Do you need transportation to your follow-up appointment?: No Do you understand care options if your condition(s) worsen?: Yes-patient verbalized understanding  SDOH Interventions Today    Flowsheet Row Most Recent Value  SDOH Interventions   Food Insecurity Interventions Intervention Not Indicated  Housing Interventions Intervention  Not Indicated  Transportation Interventions Intervention Not Indicated, Patient Resources (Friends/Family)  Utilities Interventions Intervention Not Indicated      Interventions Today    Flowsheet Row Most Recent Value  Chronic Disease   Chronic disease during today's visit Atrial Fibrillation (AFib)  [Atrial fibrillation with RVR]  General Interventions   General Interventions Discussed/Reviewed General Interventions Discussed, General Interventions Reviewed, Doctor Visits  Doctor Visits Discussed/Reviewed Doctor Visits Discussed, Doctor Visits Reviewed,  Specialist  PCP/Specialist Visits Compliance with follow-up visit  Nutrition Interventions   Nutrition Discussed/Reviewed Nutrition Discussed, Nutrition Reviewed  Pharmacy Interventions   Pharmacy Dicussed/Reviewed Pharmacy Topics Discussed, Pharmacy Topics Reviewed     Patient declined RN follow up  Gean Maidens BSN RN [facility] Care Management Coordinator [email or web address] Direct Dial: [phone]  Fax: [phone] Website: [email or web address]

## 2023-05-13 ENCOUNTER — Ambulatory Visit: Attending: Cardiology

## 2023-05-13 ENCOUNTER — Ambulatory Visit (INDEPENDENT_AMBULATORY_CARE_PROVIDER_SITE_OTHER): Payer: 59

## 2023-05-13 DIAGNOSIS — I480 Paroxysmal atrial fibrillation: Secondary | ICD-10-CM | POA: Diagnosis not present

## 2023-05-13 DIAGNOSIS — Z5181 Encounter for therapeutic drug level monitoring: Secondary | ICD-10-CM | POA: Diagnosis not present

## 2023-05-13 DIAGNOSIS — I495 Sick sinus syndrome: Secondary | ICD-10-CM

## 2023-05-13 DIAGNOSIS — E1169 Type 2 diabetes mellitus with other specified complication: Secondary | ICD-10-CM | POA: Diagnosis not present

## 2023-05-13 LAB — POCT INR: INR: 2.6 (ref 2.0–3.0)

## 2023-05-13 NOTE — Telephone Encounter (Signed)
 Please reach out to schedule a sooner 30-minute visit; a virtual appointment is fine if she is able to do video.

## 2023-05-13 NOTE — Patient Instructions (Signed)
 Description   START taking 5mg  daily except 7.5mg  on Sundays, Tuesdays and Fridays.  Stay consistent with greens each week (1 per week)  Recheck INR in 1 week.  Anticoagulation Clinic [phone]  *Amiodarone 200mg  daily*

## 2023-05-14 ENCOUNTER — Ambulatory Visit: Admitting: Cardiology

## 2023-05-14 ENCOUNTER — Encounter: Payer: Self-pay | Admitting: Cardiology

## 2023-05-14 ENCOUNTER — Ambulatory Visit (INDEPENDENT_AMBULATORY_CARE_PROVIDER_SITE_OTHER): Payer: 59 | Admitting: Cardiology

## 2023-05-14 ENCOUNTER — Other Ambulatory Visit: Payer: Self-pay | Admitting: Psychiatry

## 2023-05-14 VITALS — BP 124/72 | HR 100 | Ht 65.0 in | Wt 201.8 lb

## 2023-05-14 VITALS — BP 110/64 | HR 98 | Ht 65.0 in | Wt 198.0 lb

## 2023-05-14 DIAGNOSIS — D6869 Other thrombophilia: Secondary | ICD-10-CM | POA: Diagnosis not present

## 2023-05-14 DIAGNOSIS — I480 Paroxysmal atrial fibrillation: Secondary | ICD-10-CM | POA: Diagnosis not present

## 2023-05-14 DIAGNOSIS — Z01812 Encounter for preprocedural laboratory examination: Secondary | ICD-10-CM

## 2023-05-14 DIAGNOSIS — Z79899 Other long term (current) drug therapy: Secondary | ICD-10-CM

## 2023-05-14 DIAGNOSIS — I5022 Chronic systolic (congestive) heart failure: Secondary | ICD-10-CM

## 2023-05-14 DIAGNOSIS — I4892 Unspecified atrial flutter: Secondary | ICD-10-CM

## 2023-05-14 DIAGNOSIS — I472 Ventricular tachycardia, unspecified: Secondary | ICD-10-CM

## 2023-05-14 DIAGNOSIS — I2510 Atherosclerotic heart disease of native coronary artery without angina pectoris: Secondary | ICD-10-CM

## 2023-05-14 DIAGNOSIS — I4819 Other persistent atrial fibrillation: Secondary | ICD-10-CM

## 2023-05-14 DIAGNOSIS — R4589 Other symptoms and signs involving emotional state: Secondary | ICD-10-CM

## 2023-05-14 DIAGNOSIS — I10 Essential (primary) hypertension: Secondary | ICD-10-CM

## 2023-05-14 LAB — CUP PACEART REMOTE DEVICE CHECK
Battery Remaining Longevity: 140 mo
Battery Voltage: 3.1 V
Brady Statistic RV Percent Paced: 6.93 %
Date Time Interrogation Session: 20250324220227
HighPow Impedance: 71 Ohm
Implantable Lead Connection Status: 753985
Implantable Lead Connection Status: 753985
Implantable Lead Implant Date: 20241223
Implantable Lead Implant Date: 20241223
Implantable Lead Location: 753859
Implantable Lead Location: 753860
Implantable Lead Model: 5076
Implantable Lead Model: 5076
Implantable Pulse Generator Implant Date: 20241223
Lead Channel Impedance Value: 323 Ohm
Lead Channel Impedance Value: 342 Ohm
Lead Channel Impedance Value: 399 Ohm
Lead Channel Pacing Threshold Amplitude: 0.75 V
Lead Channel Pacing Threshold Amplitude: 0.75 V
Lead Channel Pacing Threshold Pulse Width: 0.4 ms
Lead Channel Pacing Threshold Pulse Width: 0.4 ms
Lead Channel Sensing Intrinsic Amplitude: 2.3 mV
Lead Channel Sensing Intrinsic Amplitude: 25.9 mV
Lead Channel Setting Pacing Amplitude: 1.5 V
Lead Channel Setting Pacing Amplitude: 2 V
Lead Channel Setting Pacing Pulse Width: 0.4 ms
Lead Channel Setting Sensing Sensitivity: 0.3 mV
Zone Setting Status: 755011
Zone Setting Status: 755011

## 2023-05-14 LAB — CBC

## 2023-05-14 MED ORDER — METOPROLOL SUCCINATE ER 100 MG PO TB24: 100.0000 mg | ORAL_TABLET | Freq: Two times a day (BID) | ORAL | 3 refills | Status: AC

## 2023-05-14 MED ORDER — HYDROXYZINE HCL 25 MG PO TABS: 25.0000 mg | ORAL_TABLET | Freq: Every day | ORAL | 0 refills | Status: AC | PRN

## 2023-05-14 NOTE — Progress Notes (Signed)
 Electrophysiology Office Note:   Date:  05/14/2023  ID:  Kimberly Hobbs, DOB 1960-07-10, MRN [id number]  Primary Cardiologist: Thomasene Ripple, DO Primary Heart Failure: None Electrophysiologist: Abrahan Fulmore Jorja Loa, MD      History of Present Illness:   Kimberly Hobbs is a 63 y.o. female with h/o persistent atrial fibrillation, coronary artery disease, valvular heart disease post AVR/MVR, diabetes, ventricular tachycardia seen today for routine electrophysiology followup.   Since last being seen in our clinic the patient reports continued fatigue and shortness of breath.  She is unfortunately in atrial fibrillation today.  She has been in atrial fibrillation for a few weeks.  She had an attempt at cardioversion with TEE, but unfortunately she had a very small left atrial appendage thrombus.  She has plans for TEE and cardioversion again in 3 weeks.  She has significant anxiety which she feels is causing her chest discomfort as well as shortness of breath from her atrial fibrillation.  she denies palpitations, PND, orthopnea, nausea, vomiting, dizziness, syncope, edema, weight gain, or early satiety.   Review of systems complete and found to be negative unless listed in HPI.      EP Information / Studies Reviewed:    EKG is not ordered today. EKG from 05/14/2023 reviewed which showed atrial fibrillation      ICD Interrogation-  reviewed in detail today,  See PACEART report.  Device History: Medtronic Dual Chamber ICD implanted 02/10/2023 for ventricular tachycardia History of appropriate therapy: No History of AAD therapy: Yes; currently on amiodarone    Risk Assessment/Calculations:    CHA2DS2-VASc Score = 5   This indicates a 7.2% annual risk of stroke. The patient's score is based upon: CHF History: 1 HTN History: 1 Diabetes History: 1 Stroke History: 0 Vascular Disease History: 1 Age Score: 0 Gender Score: 1        STOP-Bang Score:  2       Physical Exam:   VS:   LMP 06/20/2015    Wt Readings from Last 3 Encounters:  05/14/23 201 lb 12.8 oz (91.5 kg)  05/09/23 203 lb 0.7 oz (92.1 kg)  04/18/23 205 lb (93 kg)     GEN: Well nourished, well developed in no acute distress NECK: No JVD; No carotid bruits CARDIAC: Irregularly irregular rate and rhythm, no murmurs, rubs, gallops RESPIRATORY:  Clear to auscultation without rales, wheezing or rhonchi  ABDOMEN: Soft, non-tender, non-distended EXTREMITIES:  No edema; No deformity   ASSESSMENT AND PLAN:    Ventricular tachycardia s/p Medtronic dual chamber ICD  euvolemic today Stable on an appropriate medical regimen Normal ICD function Sensing, threshold, impedance within normal limits Programming reviewed and stable for patient See Arita Miss Art report No changes today  2.  Persistent atrial fibrillation/flutter: Post ablation July 2024.  She is continued to have atrial arrhythmias.  She would benefit from rhythm control.  She has a planned TEE and cardioversion.  If she does not have a thrombus, we Roneisha Stern plan for ablation post cardioversion.  Risk and benefits have been discussed.  She understands the risks and is agreed to the procedure.  Risk, benefits, and alternatives to EP study and radiofrequency/pulse field ablation for afib were also discussed in detail today. These risks include but are not limited to stroke, bleeding, vascular damage, tamponade, perforation, damage to the esophagus, lungs, and other structures, pulmonary vein stenosis, worsening renal function, and death. The patient understands these risk and wishes to proceed.  We Christepher Melchior therefore proceed with catheter ablation  at the next available time.  Carto, ICE, anesthesia are requested for the procedure.  Tailor Westfall also obtain CT PV protocol prior to the procedure to exclude LAA thrombus and further evaluate atrial anatomy.  3.  Coronary artery disease: No current chest pain  4.  Valvular heart disease: Post AVR/MVR.  Stable on most recent  echo  5.  Second hypercoagulable state: Currently on warfarin  Disposition:   Follow up with Afib Clinic as usual post procedure   Signed, Lochlin Eppinger Jorja Loa, MD

## 2023-05-14 NOTE — H&P (View-Only) (Signed)
 Electrophysiology Office Note:   Date:  05/14/2023  ID:  Kimberly Hobbs, DOB 1960-07-10, MRN [id number]  Primary Cardiologist: Thomasene Ripple, DO Primary Heart Failure: None Electrophysiologist: Abrahan Fulmore Jorja Loa, MD      History of Present Illness:   Kimberly Hobbs is a 63 y.o. female with h/o persistent atrial fibrillation, coronary artery disease, valvular heart disease post AVR/MVR, diabetes, ventricular tachycardia seen today for routine electrophysiology followup.   Since last being seen in our clinic the patient reports continued fatigue and shortness of breath.  She is unfortunately in atrial fibrillation today.  She has been in atrial fibrillation for a few weeks.  She had an attempt at cardioversion with TEE, but unfortunately she had a very small left atrial appendage thrombus.  She has plans for TEE and cardioversion again in 3 weeks.  She has significant anxiety which she feels is causing her chest discomfort as well as shortness of breath from her atrial fibrillation.  she denies palpitations, PND, orthopnea, nausea, vomiting, dizziness, syncope, edema, weight gain, or early satiety.   Review of systems complete and found to be negative unless listed in HPI.      EP Information / Studies Reviewed:    EKG is not ordered today. EKG from 05/14/2023 reviewed which showed atrial fibrillation      ICD Interrogation-  reviewed in detail today,  See PACEART report.  Device History: Medtronic Dual Chamber ICD implanted 02/10/2023 for ventricular tachycardia History of appropriate therapy: No History of AAD therapy: Yes; currently on amiodarone    Risk Assessment/Calculations:    CHA2DS2-VASc Score = 5   This indicates a 7.2% annual risk of stroke. The patient's score is based upon: CHF History: 1 HTN History: 1 Diabetes History: 1 Stroke History: 0 Vascular Disease History: 1 Age Score: 0 Gender Score: 1        STOP-Bang Score:  2       Physical Exam:   VS:   LMP 06/20/2015    Wt Readings from Last 3 Encounters:  05/14/23 201 lb 12.8 oz (91.5 kg)  05/09/23 203 lb 0.7 oz (92.1 kg)  04/18/23 205 lb (93 kg)     GEN: Well nourished, well developed in no acute distress NECK: No JVD; No carotid bruits CARDIAC: Irregularly irregular rate and rhythm, no murmurs, rubs, gallops RESPIRATORY:  Clear to auscultation without rales, wheezing or rhonchi  ABDOMEN: Soft, non-tender, non-distended EXTREMITIES:  No edema; No deformity   ASSESSMENT AND PLAN:    Ventricular tachycardia s/p Medtronic dual chamber ICD  euvolemic today Stable on an appropriate medical regimen Normal ICD function Sensing, threshold, impedance within normal limits Programming reviewed and stable for patient See Arita Miss Art report No changes today  2.  Persistent atrial fibrillation/flutter: Post ablation July 2024.  She is continued to have atrial arrhythmias.  She would benefit from rhythm control.  She has a planned TEE and cardioversion.  If she does not have a thrombus, we Roneisha Stern plan for ablation post cardioversion.  Risk and benefits have been discussed.  She understands the risks and is agreed to the procedure.  Risk, benefits, and alternatives to EP study and radiofrequency/pulse field ablation for afib were also discussed in detail today. These risks include but are not limited to stroke, bleeding, vascular damage, tamponade, perforation, damage to the esophagus, lungs, and other structures, pulmonary vein stenosis, worsening renal function, and death. The patient understands these risk and wishes to proceed.  We Christepher Melchior therefore proceed with catheter ablation  at the next available time.  Carto, ICE, anesthesia are requested for the procedure.  Tailor Westfall also obtain CT PV protocol prior to the procedure to exclude LAA thrombus and further evaluate atrial anatomy.  3.  Coronary artery disease: No current chest pain  4.  Valvular heart disease: Post AVR/MVR.  Stable on most recent  echo  5.  Second hypercoagulable state: Currently on warfarin  Disposition:   Follow up with Afib Clinic as usual post procedure   Signed, Lochlin Eppinger Jorja Loa, MD

## 2023-05-14 NOTE — Patient Instructions (Addendum)
 Medication Instructions:  Your physician recommends that you continue on your current medications as directed. Please refer to the Current Medication list given to you today.  *If you need a refill on your cardiac medications before your next appointment, please call your pharmacy*   Lab Work: Pre procedure labs -- we will call you to schedule:  BMP & CBC  If you have a lab test that is abnormal and we need to change your treatment, we will call you to review the results -- otherwise no news is good news.    Testing/Procedures: Your physician has requested that you have cardiac CT 1 month PRIOR to your ablation. Cardiac computed tomography (CT) is a painless test that uses an x-ray machine to take clear, detailed pictures of your heart. We will contact you if the result is abnormal. We will call you to schedule.  If we have not called you a week before this test -- call the office for pre medication CT needs (Prednisone & Benadryl)  Your physician has recommended that you have a repeat ablation. Catheter ablation is a medical procedure used to treat some cardiac arrhythmias (irregular heartbeats). During catheter ablation, a long, thin, flexible tube is put into a blood vessel in your groin (upper thigh), or neck. This tube is called an ablation catheter. It is then guided to your heart through the blood vessel. Radio frequency waves destroy small areas of heart tissue where abnormal heartbeats may cause an arrhythmia to start.   Your ablation is scheduled for 07/24/2023. Please arrive at [facility] at 8:00 am.  We will call you for further instructions.   Follow-Up: At [facility], you and your health needs are our priority.  As part of our continuing mission to provide you with exceptional heart care, we have created designated Provider Care Teams.  These Care Teams include your primary Cardiologist (physician) and Advanced Practice Providers (APPs -  Physician Assistants and Nurse  Practitioners) who all work together to provide you with the care you need, when you need it.  Your next appointment:   1 month(s) after your ablation  The format for your next appointment:   In Person  Provider:   AFib clinic   Thank you for choosing [facility]!!   Dory Horn, RN [phone]    Other Instructions   Cardiac Ablation Cardiac ablation is a procedure to destroy (ablate) some heart tissue that is sending bad signals. These bad signals cause problems in heart rhythm. The heart has many areas that make these signals. If there are problems in these areas, they can make the heart beat in a way that is not normal. Destroying some tissues can help make the heart rhythm normal. Tell your doctor about: Any allergies you have. All medicines you are taking. These include vitamins, herbs, eye drops, creams, and over-the-counter medicines. Any problems you or family members have had with medicines that make you fall asleep (anesthetics). Any blood disorders you have. Any surgeries you have had. Any medical conditions you have, such as kidney failure. Whether you are pregnant or may be pregnant. What are the risks? This is a safe procedure. But problems may occur, including: Infection. Bruising and bleeding. Bleeding into the chest. Stroke or blood clots. Damage to nearby areas of your body. Allergies to medicines or dyes. The need for a pacemaker if the normal system is damaged. Failure of the procedure to treat the problem. What happens before the procedure? Medicines Ask your doctor  about: Changing or stopping your normal medicines. This is important. Taking aspirin and ibuprofen. Do not take these medicines unless your doctor tells you to take them. Taking other medicines, vitamins, herbs, and supplements. General instructions Follow instructions from your doctor about what you cannot eat or drink. Plan to have someone take you home from the hospital  or clinic. If you will be going home right after the procedure, plan to have someone with you for 24 hours. Ask your doctor what steps will be taken to prevent infection. What happens during the procedure?  An IV tube will be put into one of your veins. You will be given a medicine to help you relax. The skin on your neck or groin will be numbed. A cut (incision) will be made in your neck or groin. A needle will be put through your cut and into a large vein. A tube (catheter) will be put into the needle. The tube will be moved to your heart. Dye may be put through the tube. This helps your doctor see your heart. Small devices (electrodes) on the tube will send out signals. A type of energy will be used to destroy some heart tissue. The tube will be taken out. Pressure will be held on your cut. This helps stop bleeding. A bandage will be put over your cut. The exact procedure may vary among doctors and hospitals. What happens after the procedure? You will be watched until you leave the hospital or clinic. This includes checking your heart rate, breathing rate, oxygen, and blood pressure. Your cut will be watched for bleeding. You will need to lie still for a few hours. Do not drive for 24 hours or as long as your doctor tells you. Summary Cardiac ablation is a procedure to destroy some heart tissue. This is done to treat heart rhythm problems. Tell your doctor about any medical conditions you may have. Tell him or her about all medicines you are taking to treat them. This is a safe procedure. But problems may occur. These include infection, bruising, bleeding, and damage to nearby areas of your body. Follow what your doctor tells you about food and drink. You may also be told to change or stop some of your medicines. After the procedure, do not drive for 24 hours or as long as your doctor tells you. This information is not intended to replace advice given to you by your health care  provider. Make sure you discuss any questions you have with your health care provider. Document Revised: 04/27/2021 Document Reviewed: 01/07/2019 Elsevier Patient Education  2023 [employer]   Cardiac Ablation, Care After  This sheet gives you information about how to care for yourself after your procedure. Your health care provider may also give you more specific instructions. If you have problems or questions, contact your health care provider. What can I expect after the procedure? After the procedure, it is common to have: Bruising around your puncture site. Tenderness around your puncture site. Skipped heartbeats. If you had an atrial fibrillation ablation, you may have atrial fibrillation during the first several months after your procedure.  Tiredness (fatigue).  Follow these instructions at home: Puncture site care  Follow instructions from your health care provider about how to take care of your puncture site. Make sure you: If present, leave stitches (sutures), skin glue, or adhesive strips in place. These skin closures may need to stay in place for up to 2 weeks. If adhesive strip edges start to loosen  and curl up, you may trim the loose edges. Do not remove adhesive strips completely unless your health care provider tells you to do that. If a large square bandage is present, this may be removed 24 hours after surgery.  Check your puncture site every day for signs of infection. Check for: Redness, swelling, or pain. Fluid or blood. If your puncture site starts to bleed, lie down on your back, apply firm pressure to the area, and contact your health care provider. Warmth. Pus or a bad smell. A pea or small marble sized lump at the site is normal and can take up to three months to resolve.  Driving Do not drive for at least 4 days after your procedure or however long your health care provider recommends. (Do not resume driving if you have previously been instructed not to drive  for other health reasons.) Do not drive or use heavy machinery while taking prescription pain medicine. Activity Avoid activities that take a lot of effort for at least 7 days after your procedure. Do not lift anything that is heavier than 5 lb (4.5 kg) for one week.  No sexual activity for 1 week.  Return to your normal activities as told by your health care provider. Ask your health care provider what activities are safe for you. General instructions Take over-the-counter and prescription medicines only as told by your health care provider. Do not use any products that contain nicotine or tobacco, such as cigarettes and e-cigarettes. If you need help quitting, ask your health care provider. You may shower after 24 hours, but Do not take baths, swim, or use a hot tub for 1 week.  Do not drink alcohol for 24 hours after your procedure. Keep all follow-up visits as told by your health care provider. This is important. Contact a health care provider if: You have redness, mild swelling, or pain around your puncture site. You have fluid or blood coming from your puncture site that stops after applying firm pressure to the area. Your puncture site feels warm to the touch. You have pus or a bad smell coming from your puncture site. You have a fever. You have chest pain or discomfort that spreads to your neck, jaw, or arm. You have chest pain that is worse with lying on your back or taking a deep breath. You are sweating a lot. You feel nauseous. You have a fast or irregular heartbeat. You have shortness of breath. You are dizzy or light-headed and feel the need to lie down. You have pain or numbness in the arm or leg closest to your puncture site. Get help right away if: Your puncture site suddenly swells. Your puncture site is bleeding and the bleeding does not stop after applying firm pressure to the area. These symptoms may represent a serious problem that is an emergency. Do not wait to  see if the symptoms will go away. Get medical help right away. Call your local emergency services (911 in the [place] Do not drive yourself to the hospital. Summary After the procedure, it is normal to have bruising and tenderness at the puncture site in your groin, neck, or forearm. Check your puncture site every day for signs of infection. Get help right away if your puncture site is bleeding and the bleeding does not stop after applying firm pressure to the area. This is a medical emergency. This information is not intended to replace advice given to you by your health care provider. Make sure you discuss  any questions you have with your health care provider.

## 2023-05-14 NOTE — Patient Instructions (Addendum)
 Medication Instructions:  Your physician recommends that you continue on your current medications as directed. Please refer to the Current Medication list given to you today.  *If you need a refill on your cardiac medications before your next appointment, please call your pharmacy*   Lab Work: CMET, Mag, CBC If you have labs (blood work) drawn today and your tests are completely normal, you will receive your results only by: MyChart Message (if you have MyChart) OR A paper copy in the mail If you have any lab test that is abnormal or we need to change your treatment, we will call you to review the results.   Testing/Procedures:     Dear Kimberly Hobbs  You are scheduled for a  TEE/ Cardioversion  on Monday, April 14 with Dr. Anne Fu.  Please arrive at the [facility] Entr[facility] at [facility]: [address] [place], [place] [address] at 6:30 am  (This time is 1.5 hour(s) before your procedure to ensure your preparation).   Free valet parking service is available. You will check in at ADMITTING.   *Please Note: You will receive a call the day before your procedure to confirm the appointment time. That time may have changed from the original time based on the schedule for that day.*    DIET:  Nothing to eat or drink after midnight except a sip of water with medications (see medication instructions below)  MEDICATION INSTRUCTIONS:        :1}Continue taking your anticoagulant (blood thinner): Warfarin (Coumadin).  You will need to continue this after your procedure until you are told by your provider that it is safe to stop.    LABS: will be done today, cmet, mg, cbc   FYI:  For your safety, and to allow [place] to monitor your vital signs accurately during the surgery/procedure we request: If you have artificial nails, gel coating, SNS etc, please have those removed prior to your surgery/procedure. Not having the nail coverings /polish removed may result in cancellation  or delay of your surgery/procedure.  Your support person will be asked to wait in the waiting room during your procedure.  It is OK to have someone drop you off and come back when you are ready to be discharged.  You cannot drive after the procedure and will need someone to drive you home.  Bring your insurance cards.  *Special Note: Every effort is made to have your procedure done on time. Occasionally there are emergencies that occur at the hospital that may cause delays. Please be patient if a delay does occur.       Follow-Up: At [facility], you and your health needs are our priority.  As part of our continuing mission to provide you with exceptional heart care, we have created designated Provider Care Teams.  These Care Teams include your primary Cardiologist (physician) and Advanced Practice Providers (APPs -  Physician Assistants and Nurse Practitioners) who all work together to provide you with the care you need, when you need it.   Your next appointment:   6-8 week(s)  Provider:   Thomasene Ripple, DO     Other Instructions   1st Floor: - Lobby - Registration  - Pharmacy  - Lab - Cafe  2nd Floor: - PV Lab - Diagnostic Testing (echo, CT, nuclear med)  3rd Floor: - Vacant  4th Floor: - TCTS (cardiothoracic surgery) - AFib Clinic - Structural Heart Clinic - Vascular Surgery  - Vascular Ultrasound  5th Floor: - HeartCare Cardiology (general and EP) - Clinical Pharmacy for coumadin, hypertension, lipid, weight-loss medications, and med management appointments    Valet parking services will be available as well.

## 2023-05-14 NOTE — Progress Notes (Signed)
 Cardiology Office Note:    Date:  05/18/2023   ID:  Kimberly Hobbs, DOB May 08, 1960, MRN [id number]  PCP:  Buckner Malta, PA  Cardiologist:  Thomasene Ripple, DO  Electrophysiologist:  Regan Lemming, MD   Referring MD: Elder Negus, MD   " I am nervous"   History of Present Illness:    Kimberly Hobbs is a 63 y.o. 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  During her recent hospitatlization unable to cardiovert the patient due noting a very small LAA clot in the presence of intermittent suboptimal INR  She presents  with ongoing anxiety and chest pressure since her recent hospital discharge. She reports that the chest pressure worsens her anxiety, leading to a vicious cycle. The patient has been taking Ativan and Klonopin for anxiety, but reports that the Klonopin is no longer effective. She has also been prescribed Zoloft, which she takes at night. The patient is currently on warfarin and metoprolol for her heart condition. She expresses frustration and fatigue with her current health situation and is eager for relief.  Past Medical History:  Diagnosis Date   Allergy ?   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   COPD (chronic obstructive pulmonary disease) (HCC)    COVID-19    GERD (gastroesophageal reflux disease)    H/O mitral valve replacement with mechanical #25 mechanical SJM 01/01/2017) 01/01/2017   Hyperlipidemia    Hypertension    Pain in both lower extremities 07/23/2019   Paroxysmal atrial fibrillation (HCC)  06/29/2015   Rheumatic heart disease    MS/ AS   Sleep apnea    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 01/01/2017 01/01/2017    Past Surgical History:  Procedure Laterality Date   ATRIAL FIBRILLATION ABLATION N/A 09/05/2022   Procedure: ATRIAL FIBRILLATION ABLATION;  Surgeon: Regan Lemming, MD;  Location: [facility];  Service: Cardiovascular;  Laterality: N/A;   CARDIAC CATHETERIZATION     CARDIAC VALVE REPLACEMENT     CARDIOVERSION N/A 04/02/2022   Procedure: CARDIOVERSION;  Surgeon: Elder Negus, MD;  Location: [facility];  Service: Cardiovascular;  Laterality: N/A;   CARDIOVERSION N/A 04/27/2022   Procedure: CARDIOVERSION;  Surgeon: Elder Negus, MD;  Location: [facility];  Service: Cardiovascular;  Laterality: N/A;   CARDIOVERSION N/A 03/27/2023   Procedure: CARDIOVERSION;  Surgeon: Maisie Fus, MD;  Location: [facility];  Service: Cardiovascular;  Laterality: N/A;   CARDIOVERSION N/A 05/06/2023   Procedure: CARDIOVERSION;  Surgeon: Thomasene Ripple, DO;  Location: [facility];  Service: Cardiovascular;  Laterality: N/A;   CHOLECYSTECTOMY     CORONARY ARTERY BYPASS GRAFT     CORONARY/GRAFT ANGIOGRAPHY N/A 08/18/2018   Procedure: CORONARY/GRAFT ANGIOGRAPHY;  Surgeon: Elder Negus, MD;  Location: [facility];  Service: Cardiovascular;  Laterality: N/A;   ELECTROPHYSIOLOGY STUDY N/A 02/04/2023   Procedure: ELECTROPHYSIOLOGY STUDY;  Surgeon: Regan Lemming, MD;  Location: [facility];  Service: Cardiovascular;  Laterality: N/A;   ICD IMPLANT N/A 02/10/2023  Procedure: ICD IMPLANT;  Surgeon: Regan Lemming, MD;  Location: [facility] INVASIVE CV LAB;  Service: Cardiovascular;  Laterality: N/A;   LEG SURGERY     "metal plate in leg"   RIGHT HEART CATH AND CORONARY/GRAFT ANGIOGRAPHY N/A 07/21/2018   Procedure: RIGHT HEART CATH AND CORONARY/GRAFT ANGIOGRAPHY;  Surgeon: Elder Negus, MD;   [facility]: [facility];  Service: Cardiovascular;  Laterality: N/A;   TEE WITHOUT CARDIOVERSION N/A 07/27/2015   Procedure: TRANSESOPHAGEAL ECHOCARDIOGRAM (TEE);  Surgeon: Thurmon Fair, MD;  Location: [facility] ENDOSCOPY;  Service: Cardiovascular;  Laterality: N/A;   TEE WITHOUT CARDIOVERSION N/A 07/21/2018   Procedure: TRANSESOPHAGEAL ECHOCARDIOGRAM (TEE);  Surgeon: Elder Negus, MD;  Location: [facility] ENDOSCOPY;  Service: Cardiovascular;  Laterality: N/A;   TONSILLECTOMY AND ADENOIDECTOMY     TRANSESOPHAGEAL ECHOCARDIOGRAM (CATH LAB) N/A 05/06/2023   Procedure: TRANSESOPHAGEAL ECHOCARDIOGRAM;  Surgeon: Thomasene Ripple, DO;  Location: [facility];  Service: Cardiovascular;  Laterality: N/A;    Current Medications: Current Meds  Medication Sig   Accu-Chek Softclix Lancets lancets Use as instructed (Patient taking differently: 1 each by Other route See admin instructions. Use as instructed)   acetaminophen (TYLENOL) 325 MG tablet Take 2 tablets (650 mg total) by mouth every 4 (four) hours as needed for headache or mild pain (pain score 1-3).   albuterol (VENTOLIN HFA) 108 (90 Base) MCG/ACT inhaler Inhale 1-2 puffs into the lungs every 6 (six) hours as needed for wheezing or shortness of breath.   ALPRAZolam (XANAX) 0.25 MG tablet Take 1 tablet (0.25 mg total) by mouth daily as needed for anxiety (Patient not taking: Reported on 05/14/2023)   amiodarone (PACERONE) 200 MG tablet Take 1 tablet (200 mg total) by mouth 2 (two) times daily.   aspirin EC 81 MG tablet Take 81 mg by mouth daily.    Blood Glucose Monitoring Suppl (ONE TOUCH ULTRA 2) w/Device KIT 3 (three) times daily.   Budeson-Glycopyrrol-Formoterol (BREZTRI AEROSPHERE) 160-9-4.8 MCG/ACT AERO Inhale 2 puffs into the lungs in the morning and at bedtime.   clonazePAM (KLONOPIN) 0.5 MG tablet Take 1.5 tablets (0.75 mg total) by mouth at bedtime.   furosemide (LASIX) 40 MG tablet Take 1 tablet (40 mg total) by mouth daily.    gabapentin (NEURONTIN) 100 MG capsule Take 1 capsule (100 mg total) by mouth at bedtime.   Glucose Blood (BLOOD GLUCOSE TEST STRIPS) STRP 1 each by In Vitro route in the morning, at noon, and at bedtime. May substitute to any manufacturer covered by patient's insurance.   guaiFENesin-codeine 100-10 MG/5ML syrup Take 5 mLs by mouth every 6 (six) hours as needed for cough.   isosorbide mononitrate (IMDUR) 60 MG 24 hr tablet Take 1 tablet (60 mg total) by mouth daily.   magnesium oxide (MAG-OX) 400 (240 Mg) MG tablet Take 1 tablet (400 mg total) by mouth daily.   metFORMIN (GLUCOPHAGE) 500 MG tablet Take 1 tablet (500 mg total) by mouth 2 (two) times daily with a meal.   montelukast (SINGULAIR) 10 MG tablet Take 1 tablet (10 mg total) by mouth daily.   oxyCODONE-acetaminophen (PERCOCET) 5-325 MG tablet Take 1 tablet by mouth every 12 (twelve) hours as needed for severe pain (pain score 7-10).   pantoprazole (PROTONIX) 40 MG tablet Take 1 tablet (40 mg total) by mouth 2 (two) times daily.   potassium chloride SA (KLOR-CON M) 20 MEQ tablet Take 1 tablet (20 mEq total) by mouth daily. Take 2 tablets once at dinner 03/26/23 and then 1  tablet daily starting 03/27/23 (Patient taking differently: Take 20 mEq by mouth daily.)   sacubitril-valsartan (ENTRESTO) 24-26 MG Take 1 tablet by mouth 2 (two) times daily.   sertraline (ZOLOFT) 50 MG tablet Take 1 tablet (50 mg total) by mouth at bedtime.   warfarin (COUMADIN) 5 MG tablet Take 1 tablet (5 mg total) by mouth 4 (four) times a week. Sat, Tues, Thurs, and Sun   warfarin (COUMADIN) 7.5 MG tablet Take 7.5 mg by mouth See admin instructions. Take 1 tablet by mouth every Fri and Sun   [DISCONTINUED] metoprolol succinate (TOPROL-XL) 100 MG 24 hr tablet Take 100 mg by mouth 2 (two) times daily.     Allergies:   Duloxetine hcl, Iodinated contrast media, Penicillins, and Doxycycline   Social History   Socioeconomic History   Marital status: Divorced    Spouse name:  Not on file   Number of children: 4   Years of education: Not on file   Highest education level: GED or equivalent  Occupational History   Not on file  Tobacco Use   Smoking status: Former    Current packs/day: 0.00    Average packs/day: 1.6 packs/day for 50.0 years (80.0 ttl pk-yrs)    Types: Cigarettes    Start date: 12/23/1976    Quit date: 12/23/2016    Years since quitting: 6.4    Passive exposure: Past   Smokeless tobacco: Never   Tobacco comments:    Former smoker 01/24/23  Vaping Use   Vaping status: Never Used  Substance and Sexual Activity   Alcohol use: Never   Drug use: Yes    Types: Hydrocodone   Sexual activity: Not Currently  Other Topics Concern   Not on file  Social History Narrative   ** Merged History Encounter **       Epworth Sleepiness Scale = 4 (as of 06/28/2015)   Social Drivers of Health   Financial Resource Strain: Low Risk  (10/14/2022)   Overall Financial Resource Strain (CARDIA)    Difficulty of Paying Living Expenses: Not hard at all  Food Insecurity: No Food Insecurity (05/12/2023)   Hunger Vital Sign    Worried About Running Out of Food in the Last Year: Never true    Ran Out of Food in the Last Year: Never true  Transportation Needs: No Transportation Needs (05/12/2023)   PRAPARE - Administrator, Civil Service (Medical): No    Lack of Transportation (Non-Medical): No  Physical Activity: Inactive (10/14/2022)   Exercise Vital Sign    Days of Exercise per Week: 0 days    Minutes of Exercise per Session: 0 min  Stress: Stress Concern Present (10/14/2022)   [employer] of Occupational Health - Occupational Stress Questionnaire    Feeling of Stress : To some extent  Social Connections: Moderately Isolated (10/14/2022)   Social Connection and Isolation Panel [NHANES]    Frequency of Communication with Friends and Family: More than three times a week    Frequency of Social Gatherings with Friends and Family: Never    Attends  Religious Services: Never    Database administrator or Organizations: No    Attends Engineer, structural: Never    Marital Status: Living with partner     Family History: The patient's family history includes Alcohol abuse in her father and mother; Anxiety disorder in her father and mother; COPD in her daughter; Cancer in her daughter, father, and mother; Diabetes in her father and mother;  Drug abuse in her father and mother; Heart disease in her father and mother; Hyperlipidemia in her mother; Hypertension in her brother, brother, father, mother, and sister; Schizophrenia in her maternal grandmother. There is no history of Breast cancer.  ROS:   Review of Systems  Constitution: Negative for decreased appetite, fever and weight gain.  HENT: Negative for congestion, ear discharge, hoarse voice and sore throat.   Eyes: Negative for discharge, redness, vision loss in right eye and visual halos.  Cardiovascular: Negative for chest pain, dyspnea on exertion, leg swelling, orthopnea and palpitations.  Respiratory: Negative for cough, hemoptysis, shortness of breath and snoring.   Endocrine: Negative for heat intolerance and polyphagia.  Hematologic/Lymphatic: Negative for bleeding problem. Does not bruise/bleed easily.  Skin: Negative for flushing, nail changes, rash and suspicious lesions.  Musculoskeletal: Negative for arthritis, joint pain, muscle cramps, myalgias, neck pain and stiffness.  Gastrointestinal: Negative for abdominal pain, bowel incontinence, diarrhea and excessive appetite.  Genitourinary: Negative for decreased libido, genital sores and incomplete emptying.  Neurological: Negative for brief paralysis, focal weakness, headaches and loss of balance.  Psychiatric/Behavioral: Negative for altered mental status, depression and suicidal ideas.  Allergic/Immunologic: Negative for HIV exposure and persistent infections.    EKGs/Labs/Other Studies Reviewed:    The following  studies were reviewed today:   EKG:  The ekg ordered today demonstrates   Recent Labs: 05/05/2023: TSH 9.340 05/09/2023: B Natriuretic Peptide 114.1 05/14/2023: ALT 48; BUN 27; Creatinine, Ser 1.42; Hemoglobin 12.2; Magnesium 2.4; Platelets 286; Potassium 5.3; Sodium 139  Recent Lipid Panel    Component Value Date/Time   CHOL 144 10/01/2022 1151   TRIG 170.0 (H) 10/01/2022 1151   HDL 50.60 10/01/2022 1151   CHOLHDL 3 10/01/2022 1151   VLDL 34.0 10/01/2022 1151   LDLCALC 59 10/01/2022 1151    Physical Exam:    VS:  BP 124/72 (BP Location: Right Arm, Patient Position: Sitting, Cuff Size: Normal)   Pulse 100   Ht 5\' 5"  (1.651 m)   Wt 201 lb 12.8 oz (91.5 kg)   LMP 06/20/2015   SpO2 93%   BMI 33.58 kg/m     Wt Readings from Last 3 Encounters:  05/14/23 198 lb (89.8 kg)  05/14/23 201 lb 12.8 oz (91.5 kg)  05/09/23 203 lb 0.7 oz (92.1 kg)     GEN: Well nourished, well developed in no acute distress HEENT: Normal NECK: No JVD; No carotid bruits LYMPHATICS: No lymphadenopathy CARDIAC: S1S2 noted,RRR, no murmurs, rubs, gallops RESPIRATORY:  Clear to auscultation without rales, wheezing or rhonchi  ABDOMEN: Soft, non-tender, non-distended, +bowel sounds, no guarding. EXTREMITIES: No edema, No cyanosis, no clubbing MUSCULOSKELETAL:  No deformity  SKIN: Warm and dry NEUROLOGIC:  Alert and oriented x 3, non-focal PSYCHIATRIC:  Normal affect, good insight  ASSESSMENT:    1. Paroxysmal atrial fibrillation (HCC)   2. Essential hypertension   3. Pre-procedure lab exam   4. Ventricular tachycardia (HCC)   5. Anxiety about treatment    PLAN:     Atrial Fibrillation (AFib) Persistent AFib with chest pressure. Scheduled TEE to assess thrombus resolution. Cardioversion planned post-TEE if appropriate. - Schedule TEE in three weeks. - Continue warfarin therapy. - Schedule cardioversion post-TEE if appropriate.  Hypertension Chest pressure possibly related to elevated blood  pressure. Ensured continuous metoprolol supply. - Refill metoprolol prescription.  Anxiety Anxiety exacerbated by AFib. Discussed risks of benzodiazepines and need for long-term management. Suggested hydroxyzine as a safer alternative. - Prescribe 5-10 Ativan tablets for acute  anxiety management. - Contact primary care provider for long-term anxiety management. - Advise against using Xanax due to potential interactions. - Recommend hydroxyzine as an alternative for anxiety management.  Medication Management Complex regimen with potential interactions. Advised coordination between cardiology, primary care, and psychiatry. Recommended sertraline in the morning and clonazepam at night. - Coordinate with primary care and psychiatry for medication management. - Advise consistent use of sertraline in the morning and clonazepam at night.  The patient is in agreement with the above plan. The patient left the office in stable condition.  The patient will follow up in   Medication Adjustments/Labs and Tests Ordered: Current medicines are reviewed at length with the patient today.  Concerns regarding medicines are outlined above.  Orders Placed This Encounter  Procedures   Comprehensive Metabolic Panel (CMET)   Magnesium   CBC   EKG 12-Lead   Meds ordered this encounter  Medications   metoprolol succinate (TOPROL-XL) 100 MG 24 hr tablet    Sig: Take 1 tablet (100 mg total) by mouth 2 (two) times daily.    Dispense:  180 tablet    Refill:  3    Patient Instructions  Medication Instructions:  Your physician recommends that you continue on your current medications as directed. Please refer to the Current Medication list given to you today.  *If you need a refill on your cardiac medications before your next appointment, please call your pharmacy*   Lab Work: CMET, Mag, CBC If you have labs (blood work) drawn today and your tests are completely normal, you will receive your results only  by: MyChart Message (if you have MyChart) OR A paper copy in the mail If you have any lab test that is abnormal or we need to change your treatment, we will call you to review the results.   Testing/Procedures:     Dear Kimberly Hobbs  You are scheduled for a  TEE/ Cardioversion  on Monday, April 14 with Dr. Anne Fu.  Please arrive at the [facility] (Main Entrance A) at [facility]: [address] [place], [place] [address] at 6:30 am  (This time is 1.5 hour(s) before your procedure to ensure your preparation).   Free valet parking service is available. You will check in at ADMITTING.   *Please Note: You will receive a call the day before your procedure to confirm the appointment time. That time may have changed from the original time based on the schedule for that day.*    DIET:  Nothing to eat or drink after midnight except a sip of water with medications (see medication instructions below)  MEDICATION INSTRUCTIONS:        :1}Continue taking your anticoagulant (blood thinner): Warfarin (Coumadin).  You will need to continue this after your procedure until you are told by your provider that it is safe to stop.    LABS: will be done today, cmet, mg, cbc   FYI:  For your safety, and to allow [place] to monitor your vital signs accurately during the surgery/procedure we request: If you have artificial nails, gel coating, SNS etc, please have those removed prior to your surgery/procedure. Not having the nail coverings /polish removed may result in cancellation or delay of your surgery/procedure.  Your support person will be asked to wait in the waiting room during your procedure.  It is OK to have someone drop you off and come back when you are ready to be discharged.  You cannot drive after the procedure and  will need someone to drive you home.  Bring your insurance cards.  *Special Note: Every effort is made to have your procedure done on time. Occasionally there are  emergencies that occur at the hospital that may cause delays. Please be patient if a delay does occur.       Follow-Up: At [facility], you and your health needs are our priority.  As part of our continuing mission to provide you with exceptional heart care, we have created designated Provider Care Teams.  These Care Teams include your primary Cardiologist (physician) and Advanced Practice Providers (APPs -  Physician Assistants and Nurse Practitioners) who all work together to provide you with the care you need, when you need it.   Your next appointment:   6-8 week(s)  Provider:   Thomasene Ripple, DO     Other Instructions   1st Floor: - Lobby - Registration  - Pharmacy  - Lab - Cafe  2nd Floor: - PV Lab - Diagnostic Testing (echo, CT, nuclear med)  3rd Floor: - Vacant  4th Floor: - TCTS (cardiothoracic surgery) - AFib Clinic - Structural Heart Clinic - Vascular Surgery  - Vascular Ultrasound  5th Floor: - HeartCare Cardiology (general and EP) - Clinical Pharmacy for coumadin, hypertension, lipid, weight-loss medications, and med management appointments    Valet parking services will be available as well.         Adopting a Healthy Lifestyle.  Know what a healthy weight is for you (roughly BMI <25) and aim to maintain this   Aim for 7+ servings of fruits and vegetables daily   65-80+ fluid ounces of water or unsweet tea for healthy kidneys   Limit to max 1 drink of alcohol per day; avoid smoking/tobacco   Limit animal fats in diet for cholesterol and heart health - choose grass fed whenever available   Avoid highly processed foods, and foods high in saturated/trans fats   Aim for low stress - take time to unwind and care for your mental health   Aim for 150 min of moderate intensity exercise weekly for heart health, and weights twice weekly for bone health   Aim for 7-9 hours of sleep daily   When it comes to diets, agreement about the  perfect plan isnt easy to find, even among the experts. Experts at the [facility] [employer] developed an idea known as the Healthy Eating Plate. Just imagine a plate divided into logical, healthy portions.   The emphasis is on diet quality:   Load up on vegetables and fruits - one-half of your plate: Aim for color and variety, and remember that potatoes dont count.   Go for whole grains - one-quarter of your plate: Whole wheat, barley, wheat berries, quinoa, oats, brown rice, and foods made with them. If you want pasta, go with whole wheat pasta.   Protein power - one-quarter of your plate: Fish, chicken, beans, and nuts are all healthy, versatile protein sources. Limit red meat.   The diet, however, does go beyond the plate, offering a few other suggestions.   Use healthy plant oils, such as olive, canola, soy, corn, sunflower and peanut. Check the labels, and avoid partially hydrogenated oil, which have unhealthy trans fats.   If youre thirsty, drink water. Coffee and tea are good in moderation, but skip sugary drinks and limit milk and dairy products to one or two daily servings.   The type of carbohydrate in the diet is more important than  the amount. Some sources of carbohydrates, such as vegetables, fruits, whole grains, and beans-are healthier than others.   Finally, stay active  Signed, Thomasene Ripple, DO  05/18/2023 1:21 PM    [facility]

## 2023-05-15 LAB — COMPREHENSIVE METABOLIC PANEL WITH GFR
ALT: 48 IU/L — ABNORMAL HIGH (ref 0–32)
AST: 25 IU/L (ref 0–40)
Albumin: 4.7 g/dL (ref 3.9–4.9)
Alkaline Phosphatase: 119 IU/L (ref 44–121)
BUN/Creatinine Ratio: 19 (ref 12–28)
BUN: 27 mg/dL (ref 8–27)
Bilirubin Total: 0.4 mg/dL (ref 0.0–1.2)
CO2: 21 mmol/L (ref 20–29)
Calcium: 9.5 mg/dL (ref 8.7–10.3)
Chloride: 102 mmol/L (ref 96–106)
Creatinine, Ser: 1.42 mg/dL — ABNORMAL HIGH (ref 0.57–1.00)
Globulin, Total: 2.4 g/dL (ref 1.5–4.5)
Glucose: 282 mg/dL — ABNORMAL HIGH (ref 70–99)
Potassium: 5.3 mmol/L — ABNORMAL HIGH (ref 3.5–5.2)
Sodium: 139 mmol/L (ref 134–144)
Total Protein: 7.1 g/dL (ref 6.0–8.5)
eGFR: 42 mL/min/{1.73_m2} — ABNORMAL LOW (ref >59)

## 2023-05-15 LAB — CBC
Hematocrit: 38.9 % (ref 34.0–46.6)
Hemoglobin: 12.2 g/dL (ref 11.1–15.9)
MCH: 25.7 pg — ABNORMAL LOW (ref 26.6–33.0)
MCHC: 31.4 g/dL — ABNORMAL LOW (ref 31.5–35.7)
MCV: 82 fL (ref 79–97)
Platelets: 286 10*3/uL (ref 150–450)
RBC: 4.74 x10E6/uL (ref 3.77–5.28)
RDW: 16.3 % — ABNORMAL HIGH (ref 11.7–15.4)
WBC: 7.8 10*3/uL (ref 3.4–10.8)

## 2023-05-15 LAB — MAGNESIUM: Magnesium: 2.4 mg/dL — ABNORMAL HIGH (ref 1.6–2.3)

## 2023-05-16 ENCOUNTER — Encounter: Payer: Self-pay | Admitting: Cardiology

## 2023-05-19 DIAGNOSIS — E785 Hyperlipidemia, unspecified: Secondary | ICD-10-CM | POA: Diagnosis not present

## 2023-05-19 DIAGNOSIS — E1169 Type 2 diabetes mellitus with other specified complication: Secondary | ICD-10-CM | POA: Diagnosis not present

## 2023-05-19 DIAGNOSIS — I152 Hypertension secondary to endocrine disorders: Secondary | ICD-10-CM | POA: Diagnosis not present

## 2023-05-19 DIAGNOSIS — E876 Hypokalemia: Secondary | ICD-10-CM | POA: Diagnosis not present

## 2023-05-19 DIAGNOSIS — E1159 Type 2 diabetes mellitus with other circulatory complications: Secondary | ICD-10-CM | POA: Diagnosis not present

## 2023-05-20 ENCOUNTER — Encounter: Payer: Self-pay | Admitting: Cardiology

## 2023-05-20 ENCOUNTER — Telehealth: Payer: Self-pay

## 2023-05-20 ENCOUNTER — Ambulatory Visit: Attending: Cardiology

## 2023-05-20 DIAGNOSIS — Z5181 Encounter for therapeutic drug level monitoring: Secondary | ICD-10-CM

## 2023-05-20 DIAGNOSIS — I480 Paroxysmal atrial fibrillation: Secondary | ICD-10-CM

## 2023-05-20 LAB — POCT INR: INR: 4 — AB (ref 2.0–3.0)

## 2023-05-20 NOTE — Telephone Encounter (Signed)
 Pt scheduled for DCCV on 06/02/23 and Afib Ablation on 07/24/23. Pt will continue weekly INR checks for upcoming DCCV and then will schedule weekly checks for in May to prepare for Afib ablation.

## 2023-05-20 NOTE — Patient Instructions (Signed)
 Description   Today only take 3.75mg  and then resume taking 5mg  daily except 7.5mg  on Sundays, Tuesdays and Fridays.  Stay consistent with greens each week (1 per week)  Recheck INR in 1 week.  DCCV on 4/14 and Afib Ablation on 6/5  Anticoagulation Clinic [phone]  *Amiodarone 200mg  daily*

## 2023-05-20 NOTE — Telephone Encounter (Signed)
-----   Message from Nurse Casimiro Needle D sent at 05/20/2023  9:33 AM EDT ----- Regarding: FW: AFib ablation 6/5 Fyi..  Thank you ----- Message ----- From: Baird Lyons, RN Sent: 05/20/2023   9:09 AM EDT To: [facility] Subject: AFib ablation 6/5                              Pt scheduled for afib ablation 6/5 w/ Camnitz  Will need weekly INR checks prior to ablation  Thanks Sherri RN

## 2023-05-22 ENCOUNTER — Ambulatory Visit: Payer: 59 | Admitting: Adult Health

## 2023-05-22 ENCOUNTER — Encounter: Payer: Self-pay | Admitting: Adult Health

## 2023-05-23 ENCOUNTER — Ambulatory Visit (HOSPITAL_COMMUNITY): Admitting: Physician Assistant
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
## 2023-05-26 ENCOUNTER — Encounter: Payer: Self-pay | Admitting: Psychiatry

## 2023-05-26 ENCOUNTER — Ambulatory Visit (INDEPENDENT_AMBULATORY_CARE_PROVIDER_SITE_OTHER): Admitting: Psychiatry

## 2023-05-26 ENCOUNTER — Other Ambulatory Visit: Payer: Self-pay

## 2023-05-26 ENCOUNTER — Ambulatory Visit (HOSPITAL_COMMUNITY)

## 2023-05-26 VITALS — BP 112/73 | HR 97 | Temp 97.6°F | Ht 65.0 in | Wt 203.2 lb

## 2023-05-26 DIAGNOSIS — F411 Generalized anxiety disorder: Secondary | ICD-10-CM | POA: Diagnosis not present

## 2023-05-26 DIAGNOSIS — G4700 Insomnia, unspecified: Secondary | ICD-10-CM

## 2023-05-26 DIAGNOSIS — F331 Major depressive disorder, recurrent, moderate: Secondary | ICD-10-CM | POA: Diagnosis not present

## 2023-05-26 DIAGNOSIS — F4310 Post-traumatic stress disorder, unspecified: Secondary | ICD-10-CM

## 2023-05-26 MED ORDER — CLONAZEPAM 0.5 MG PO TABS: 0.5000 mg | ORAL_TABLET | Freq: Every day | ORAL | 0 refills | Status: DC | PRN

## 2023-05-26 MED ORDER — HYDROXYZINE HCL 50 MG PO TABS: 50.0000 mg | ORAL_TABLET | Freq: Every day | ORAL | 1 refills | Status: DC | PRN

## 2023-05-26 NOTE — Patient Instructions (Signed)
 Increase sertraline 100 mg night Continue hydroxyzine 50 mg daily as needed for anxiety  Continue clonazepam 0.5 mg at night as needed for insomnia, anxiety  Hold ramelteon Referral to therapy  Next appointment- 5/13 at 11:30

## 2023-05-27 ENCOUNTER — Ambulatory Visit: Attending: Cardiology

## 2023-05-27 ENCOUNTER — Telehealth: Payer: Self-pay

## 2023-05-27 DIAGNOSIS — Z5181 Encounter for therapeutic drug level monitoring: Secondary | ICD-10-CM | POA: Diagnosis not present

## 2023-05-27 DIAGNOSIS — I480 Paroxysmal atrial fibrillation: Secondary | ICD-10-CM | POA: Diagnosis not present

## 2023-05-27 LAB — POCT INR: INR: 5.9 — AB (ref 2.0–3.0)

## 2023-05-27 NOTE — Telephone Encounter (Signed)
 INR was supratherapeutic, 5.9 at today's coumadin clinic visit and pt worried Dr Judd Gaudier may postpone DCCV - per Dr Anne Fu, pt will be able to proceed with procedure on Monday, 06/02/23. Called and made pt aware, she was very appreciative and relieved with the update.

## 2023-05-27 NOTE — Telephone Encounter (Signed)
-----   Message from Donato Schultz sent at 05/27/2023 10:32 AM EDT ----- Regarding: RE: INR - Upcoming DCCV on 06/02/23 OK to proceed with DCCV if mildly supratherapeutic.  No need to cancel.  Donato Schultz, MD ----- Message ----- From: Beverely Low, RN Sent: 05/27/2023  10:22 AM EDT To: Jake Bathe, MD; Sharin Grave, RN Subject: INR - Upcoming DCCV on 06/02/23                 Good Morning Dr Anne Fu,  I see [name] in the [facility] and she has been coming for weekly INR checks for upcoming DCCV on Monday, 4/14. She has been hesitant to eat her usual greens with Vit K because she doesn't want her INR to become subtherapeutic. Unfortunately, her INR was 5.9 today. I did instruct her to hold today's dose - she is very concerned that her procedure will now be canceled. I wanted to make you aware and confirm you okay with going forward with procedure.   Thanks,  Cammy Copa, Charity fundraiser

## 2023-05-27 NOTE — Patient Instructions (Signed)
 Description   HOLD today's dose and only take 1/2 tablet (3.75mg ) tomorrow and then resume taking 5mg  daily except 7.5mg  on Sundays, Tuesdays and Fridays.  Stay consistent with greens each week (1 per week)  Recheck INR in 1 week post DCCV DCCV on 4/14 and Afib Ablation on 6/5  Anticoagulation Clinic [phone]  *Amiodarone 200mg  daily*

## 2023-05-30 DIAGNOSIS — Z7901 Long term (current) use of anticoagulants: Secondary | ICD-10-CM | POA: Diagnosis not present

## 2023-05-30 DIAGNOSIS — Z952 Presence of prosthetic heart valve: Secondary | ICD-10-CM | POA: Diagnosis not present

## 2023-05-30 DIAGNOSIS — I480 Paroxysmal atrial fibrillation: Secondary | ICD-10-CM | POA: Diagnosis not present

## 2023-05-30 NOTE — Progress Notes (Addendum)
 Called patient with pre-procedure instructions for Monday June 02, 2023   Patient informed of:   Time to arrive for procedure. 0630 Remain NPO past midnight.  Must have a ride home and a responsible adult to remain with them for 24 hours post procedure.  Confirmed blood thinner. Coumadin Confirmed no breaks in taking blood thinner for 3+ weeks prior to procedure. Confirmed patient stopped all GLP-1s and GLP-2s for at least one week before procedure.

## 2023-06-01 ENCOUNTER — Encounter (HOSPITAL_COMMUNITY): Payer: Self-pay | Admitting: Cardiology

## 2023-06-01 NOTE — Anesthesia Preprocedure Evaluation (Addendum)
 Anesthesia Evaluation  Patient identified by MRN, date of birth, ID band Patient awake    Reviewed: Allergy & Precautions, NPO status , Patient's Chart, lab work & pertinent test results, reviewed documented beta blocker date and time   History of Anesthesia Complications Negative for: history of anesthetic complications  Airway Mallampati: II  TM Distance: >3 FB     Dental  (+) Upper Dentures   Pulmonary sleep apnea , COPD,  COPD inhaler, former smoker   breath sounds clear to auscultation + decreased breath sounds      Cardiovascular hypertension, Pt. on medications and Pt. on home beta blockers + angina with exertion + CAD, + Cardiac Stents, + CABG and +CHF  + dysrhythmias Atrial Fibrillation and Ventricular Tachycardia + Cardiac Defibrillator + Valvular Problems/Murmurs  Rhythm:Irregular Rate:Tachycardia  Mechanical AVR, mechanical MVR both with normal function. EF 55-60%.   Neuro/Psych  PSYCHIATRIC DISORDERS Anxiety      Neuromuscular disease    GI/Hepatic Neg liver ROS,GERD  Medicated,,  Endo/Other  diabetes, Well Controlled, Type 2, Oral Hypoglycemic Agents  Obesity HLD  Renal/GU Renal InsufficiencyRenal disease        negative genitourinary   Musculoskeletal  (+) Arthritis ,    Abdominal   Peds  Hematology  (+) Blood dyscrasia, anemia            Anesthesia Other Findings   Reproductive/Obstetrics                              Anesthesia Physical Anesthesia Plan  ASA: 3  Anesthesia Plan: General   Post-op Pain Management: Minimal or no pain anticipated   Induction: Intravenous  PONV Risk Score and Plan: 3 and Treatment may vary due to age or medical condition and Propofol infusion  Airway Management Planned: Mask  Additional Equipment: None  Intra-op Plan:   Post-operative Plan:   Informed Consent: I have reviewed the patients History and Physical, chart, labs and  discussed the procedure including the risks, benefits and alternatives for the proposed anesthesia with the patient or authorized representative who has indicated his/her understanding and acceptance.     Dental advisory given  Plan Discussed with: CRNA and Anesthesiologist  Anesthesia Plan Comments:         Anesthesia Quick Evaluation

## 2023-06-02 ENCOUNTER — Encounter (HOSPITAL_COMMUNITY)
Admission: RE | Disposition: A | Discharge status: Home / Self Care | Payer: Self-pay | Source: Ambulatory Visit | Attending: Cardiology

## 2023-06-02 ENCOUNTER — Ambulatory Visit (HOSPITAL_COMMUNITY): Admitting: Anesthesiology

## 2023-06-02 ENCOUNTER — Encounter (HOSPITAL_COMMUNITY): Payer: Self-pay | Admitting: Cardiology

## 2023-06-02 ENCOUNTER — Other Ambulatory Visit: Payer: Self-pay

## 2023-06-02 ENCOUNTER — Ambulatory Visit (HOSPITAL_COMMUNITY)

## 2023-06-02 ENCOUNTER — Ambulatory Visit (HOSPITAL_COMMUNITY)
Admission: RE | Admit: 2023-06-02 | Discharge: 2023-06-02 | Disposition: A | Discharge status: Home / Self Care | Source: Ambulatory Visit | Attending: Cardiology | Admitting: Cardiology

## 2023-06-02 DIAGNOSIS — D649 Anemia, unspecified: Secondary | ICD-10-CM | POA: Insufficient documentation

## 2023-06-02 DIAGNOSIS — Z95810 Presence of automatic (implantable) cardiac defibrillator: Secondary | ICD-10-CM | POA: Insufficient documentation

## 2023-06-02 DIAGNOSIS — I2510 Atherosclerotic heart disease of native coronary artery without angina pectoris: Secondary | ICD-10-CM | POA: Diagnosis not present

## 2023-06-02 DIAGNOSIS — D6859 Other primary thrombophilia: Secondary | ICD-10-CM | POA: Insufficient documentation

## 2023-06-02 DIAGNOSIS — K219 Gastro-esophageal reflux disease without esophagitis: Secondary | ICD-10-CM | POA: Diagnosis not present

## 2023-06-02 DIAGNOSIS — I472 Ventricular tachycardia, unspecified: Secondary | ICD-10-CM | POA: Insufficient documentation

## 2023-06-02 DIAGNOSIS — E119 Type 2 diabetes mellitus without complications: Secondary | ICD-10-CM | POA: Diagnosis not present

## 2023-06-02 DIAGNOSIS — Z7984 Long term (current) use of oral hypoglycemic drugs: Secondary | ICD-10-CM | POA: Insufficient documentation

## 2023-06-02 DIAGNOSIS — I4892 Unspecified atrial flutter: Secondary | ICD-10-CM | POA: Insufficient documentation

## 2023-06-02 DIAGNOSIS — I509 Heart failure, unspecified: Secondary | ICD-10-CM | POA: Diagnosis not present

## 2023-06-02 DIAGNOSIS — Z7901 Long term (current) use of anticoagulants: Secondary | ICD-10-CM | POA: Insufficient documentation

## 2023-06-02 DIAGNOSIS — Z955 Presence of coronary angioplasty implant and graft: Secondary | ICD-10-CM | POA: Insufficient documentation

## 2023-06-02 DIAGNOSIS — I361 Nonrheumatic tricuspid (valve) insufficiency: Secondary | ICD-10-CM | POA: Diagnosis not present

## 2023-06-02 DIAGNOSIS — I4819 Other persistent atrial fibrillation: Secondary | ICD-10-CM | POA: Insufficient documentation

## 2023-06-02 DIAGNOSIS — E1122 Type 2 diabetes mellitus with diabetic chronic kidney disease: Secondary | ICD-10-CM | POA: Diagnosis not present

## 2023-06-02 DIAGNOSIS — I4891 Unspecified atrial fibrillation: Secondary | ICD-10-CM

## 2023-06-02 DIAGNOSIS — I110 Hypertensive heart disease with heart failure: Secondary | ICD-10-CM | POA: Insufficient documentation

## 2023-06-02 DIAGNOSIS — Z79899 Other long term (current) drug therapy: Secondary | ICD-10-CM | POA: Insufficient documentation

## 2023-06-02 DIAGNOSIS — Z87891 Personal history of nicotine dependence: Secondary | ICD-10-CM | POA: Diagnosis not present

## 2023-06-02 DIAGNOSIS — Z951 Presence of aortocoronary bypass graft: Secondary | ICD-10-CM | POA: Insufficient documentation

## 2023-06-02 DIAGNOSIS — J449 Chronic obstructive pulmonary disease, unspecified: Secondary | ICD-10-CM | POA: Insufficient documentation

## 2023-06-02 DIAGNOSIS — I071 Rheumatic tricuspid insufficiency: Secondary | ICD-10-CM | POA: Insufficient documentation

## 2023-06-02 DIAGNOSIS — I130 Hypertensive heart and chronic kidney disease with heart failure and stage 1 through stage 4 chronic kidney disease, or unspecified chronic kidney disease: Secondary | ICD-10-CM | POA: Diagnosis not present

## 2023-06-02 DIAGNOSIS — E785 Hyperlipidemia, unspecified: Secondary | ICD-10-CM | POA: Insufficient documentation

## 2023-06-02 DIAGNOSIS — N183 Chronic kidney disease, stage 3 unspecified: Secondary | ICD-10-CM | POA: Diagnosis not present

## 2023-06-02 HISTORY — PX: CARDIOVERSION: EP1203

## 2023-06-02 HISTORY — PX: TRANSESOPHAGEAL ECHOCARDIOGRAM (CATH LAB): EP1270

## 2023-06-02 LAB — POCT I-STAT, CHEM 8
BUN: 24 mg/dL — ABNORMAL HIGH (ref 8–23)
Calcium, Ion: 1.19 mmol/L (ref 1.15–1.40)
Chloride: 103 mmol/L (ref 98–111)
Creatinine, Ser: 1.4 mg/dL — ABNORMAL HIGH (ref 0.44–1.00)
Glucose, Bld: 111 mg/dL — ABNORMAL HIGH (ref 70–99)
HCT: 37 % (ref 36.0–46.0)
Hemoglobin: 12.6 g/dL (ref 12.0–15.0)
Potassium: 4.2 mmol/L (ref 3.5–5.1)
Sodium: 139 mmol/L (ref 135–145)
TCO2: 25 mmol/L (ref 22–32)

## 2023-06-02 LAB — ECHO TEE

## 2023-06-02 LAB — PROTIME-INR
INR: 3.2 — ABNORMAL HIGH (ref 0.8–1.2)
Prothrombin Time: 32.6 s — ABNORMAL HIGH (ref 11.4–15.2)

## 2023-06-02 SURGERY — CARDIOVERSION (CATH LAB)
Anesthesia: General

## 2023-06-02 MED ORDER — PHENYLEPHRINE 80 MCG/ML (10ML) SYRINGE FOR IV PUSH (FOR BLOOD PRESSURE SUPPORT)
PREFILLED_SYRINGE | INTRAVENOUS | Status: DC | PRN
  Administered 2023-06-02: 160 ug via INTRAVENOUS

## 2023-06-02 MED ORDER — LIDOCAINE 2% (20 MG/ML) 5 ML SYRINGE
INTRAMUSCULAR | Status: DC | PRN
  Administered 2023-06-02: 100 mg via INTRAVENOUS

## 2023-06-02 MED ORDER — SODIUM CHLORIDE 0.9 % IV SOLN: INTRAVENOUS | Status: DC

## 2023-06-02 MED ORDER — PROPOFOL 10 MG/ML IV BOLUS
INTRAVENOUS | Status: DC | PRN
  Administered 2023-06-02: 100 mg via INTRAVENOUS
  Administered 2023-06-02: 100 ug/kg/min via INTRAVENOUS

## 2023-06-02 MED ORDER — GLYCOPYRROLATE 0.2 MG/ML IJ SOLN
INTRAMUSCULAR | Status: DC | PRN
  Administered 2023-06-02: .2 mg via INTRAVENOUS

## 2023-06-02 SURGICAL SUPPLY — 1 items: PAD DEFIB RADIO PHYSIO CONN (PAD) ×1 IMPLANT

## 2023-06-02 NOTE — Interval H&P Note (Signed)
 History and Physical Interval Note:  06/02/2023 7:44 AM  Kimberly Hobbs  has presented today for surgery, with the diagnosis of AFIB.  The various methods of treatment have been discussed with the patient and family. After consideration of risks, benefits and other options for treatment, the patient has consented to  Procedure(s): CARDIOVERSION (N/A) TRANSESOPHAGEAL ECHOCARDIOGRAM (N/A) as a surgical intervention.  The patient's history has been reviewed, patient examined, no change in status, stable for surgery.  I have reviewed the patient's chart and labs.  Questions were answered to the patient's satisfaction.     [employer]

## 2023-06-02 NOTE — CV Procedure (Signed)
   Transesophageal Echocardiogram  Indications: Atrial flutter  Time out performed  During this procedure the patient was administered propofol under anesthesiology supervision to achieve and maintain moderate sedation.  The patient's heart rate, blood pressure, and oxygen saturation are monitored continuously during the procedure.   Findings:  Left Ventricle: EF 55 to 60% normal  Mitral Valve: Mechanical mitral valve, normal functioning  Aortic Valve: Mechanical aortic valve normal functioning  Tricuspid Valve: Moderate tricuspid regurgitation  Left Atrium: Normal, no left atrial appendage thrombus, minimal smoke seen throughout left atrium  Right Atrium: Normal  Intraatrial septum: Normal, color-flow Doppler shows no shunt   Dorothye Gathers, MD     Electrical Cardioversion Procedure Note Kimberly Hobbs [id number] Jul 12, 1960  Procedure: Electrical Cardioversion Indications:  Atrial Flutter  Time Out: Verified patient identification, verified procedure,medications/allergies/relevent history reviewed, required imaging and test results available.  Performed, INR therapeutic  Procedure Details  The patient was NPO after midnight. Anesthesia was administered at the beside  by Dr.Schreffler with propofol.  Cardioversion was performed with synchronized biphasic defibrillation via AP pads with 100 joules.  1 attempt(s) were performed.  The patient converted to normal sinus rhythm. The patient tolerated the procedure well   IMPRESSION:  Successful cardioversion of atrial flutter. [employer] pacer confirmed atrial paced-ventricular paced post procedure.    Dorothye Gathers 06/02/2023, 8:59 AM

## 2023-06-02 NOTE — Transfer of Care (Signed)
 Immediate Anesthesia Transfer of Care Note  Patient: Kimberly Hobbs  Procedure(s) Performed: CARDIOVERSION TRANSESOPHAGEAL ECHOCARDIOGRAM  Patient Location: Cath Lab  Anesthesia Type:MAC  Level of Consciousness: awake, alert , oriented, and patient cooperative  Airway & Oxygen Therapy: Patient Spontanous Breathing and Patient connected to face mask oxygen  Post-op Assessment: Report given to RN and Post -op Vital signs reviewed and stable  Post vital signs: Reviewed and stable  Last Vitals:  Vitals Value Taken Time  BP 91/44 06/02/23 0902  Temp    Pulse 62 06/02/23 0903  Resp 23 06/02/23 0903  SpO2 96 % 06/02/23 0903  Vitals shown include unfiled device data.  Last Pain:  Vitals:   06/02/23 0727  TempSrc: Temporal  PainSc: 0-No pain         Complications: No notable events documented.

## 2023-06-02 NOTE — Progress Notes (Signed)
 Dr Renna Cary aware of BP, ok to dc home, encourage PO fluids, Pt EOB, no c/o dizziness or pain, safety maintained

## 2023-06-02 NOTE — Anesthesia Postprocedure Evaluation (Signed)
 Anesthesia Post Note  Patient: Kimberly Hobbs  Procedure(s) Performed: CARDIOVERSION TRANSESOPHAGEAL ECHOCARDIOGRAM     Patient location during evaluation: PACU Anesthesia Type: General Level of consciousness: awake and alert and oriented Pain management: pain level controlled Vital Signs Assessment: post-procedure vital signs reviewed and stable Respiratory status: spontaneous breathing, nonlabored ventilation and respiratory function stable Cardiovascular status: blood pressure returned to baseline and stable Postop Assessment: no apparent nausea or vomiting Anesthetic complications: no   No notable events documented.  Last Vitals:  Vitals:   06/02/23 0800 06/02/23 0902  BP: 119/79 (!) 91/44  Pulse: (!) 109 78  Resp: 18 19  Temp:  36.9 C  SpO2: 94% 100%    Last Pain:  Vitals:   06/02/23 0902  TempSrc: Temporal  PainSc: 0-No pain                 [name] A.

## 2023-06-09 ENCOUNTER — Ambulatory Visit: Payer: 59 | Admitting: Psychiatry

## 2023-06-10 ENCOUNTER — Telehealth: Payer: Self-pay | Admitting: Cardiology

## 2023-06-10 ENCOUNTER — Other Ambulatory Visit: Payer: Self-pay | Admitting: Cardiology

## 2023-06-10 ENCOUNTER — Ambulatory Visit: Attending: Cardiology

## 2023-06-10 DIAGNOSIS — Z5181 Encounter for therapeutic drug level monitoring: Secondary | ICD-10-CM | POA: Diagnosis not present

## 2023-06-10 DIAGNOSIS — I480 Paroxysmal atrial fibrillation: Secondary | ICD-10-CM

## 2023-06-10 DIAGNOSIS — I25118 Atherosclerotic heart disease of native coronary artery with other forms of angina pectoris: Secondary | ICD-10-CM

## 2023-06-10 LAB — PROTIME-INR
INR: 8.2 (ref 0.9–1.2)
Prothrombin Time: 77.7 s — ABNORMAL HIGH (ref 9.1–12.0)

## 2023-06-10 NOTE — Telephone Encounter (Signed)
 Mariah Shines with [facility] is calling to report critical labs for an order placed by Dr. Krasowski.   While trying to reach a nurse, [facility] disconnected call. Please advise.

## 2023-06-10 NOTE — Patient Instructions (Signed)
 Description   SEEK IMMEDIATE MEDICAL ATTENTION IF YOU HAVE SIGNS OR SYMPTOMS OF BLEEDING.  HOLD Warfarin today, tomorrow, Thursday, and Friday. Then, START taking  5mg  daily except 7.5mg  on Sundays.  Stay consistent with greens each week (1 per week)  Recheck INR in 1 week.  Afib Ablation on 6/5  Anticoagulation Clinic [phone]  *Amiodarone  200mg  daily*

## 2023-06-10 NOTE — Telephone Encounter (Signed)
 INR 8.2 reported. Natividad Balding, RN with coag clinic aware. Abigail read back and verified result.

## 2023-06-13 ENCOUNTER — Telehealth: Payer: Self-pay | Admitting: Cardiology

## 2023-06-13 NOTE — Telephone Encounter (Signed)
 Spoke with pt regarding Coumadin  per Chris Pavero. He recommended that she resume her every other day 7.5mg  alt with 5mg  and follow up with coumadin  clinic on Tuesday. Pt verbalized understanding and had no questions.

## 2023-06-13 NOTE — Telephone Encounter (Signed)
 Patient should restart previous schedule of warfarin 7.5mg  today, 5mg  on Saturday, 7.5mg  on Sunday, 5mg  on Monday and follow up in Coumadin  clinic on Tuesday

## 2023-06-13 NOTE — Telephone Encounter (Signed)
 Patient called to report that her INR today is 2.2 (taken 10 minutes ago).  Patient wants a call back regarding next steps.

## 2023-06-17 ENCOUNTER — Encounter: Payer: Self-pay | Admitting: Cardiology

## 2023-06-17 ENCOUNTER — Other Ambulatory Visit: Payer: Self-pay | Admitting: Student

## 2023-06-17 ENCOUNTER — Ambulatory Visit: Attending: Cardiology

## 2023-06-17 ENCOUNTER — Ambulatory Visit: Payer: 59 | Admitting: Licensed Clinical Social Worker

## 2023-06-17 DIAGNOSIS — Z5181 Encounter for therapeutic drug level monitoring: Secondary | ICD-10-CM

## 2023-06-17 DIAGNOSIS — I480 Paroxysmal atrial fibrillation: Secondary | ICD-10-CM

## 2023-06-17 LAB — POCT INR: INR: 2.9 (ref 2.0–3.0)

## 2023-06-17 NOTE — Patient Instructions (Signed)
 Description   Continue taking  5mg  daily except 7.5mg  on Sundays.  Stay consistent with greens each week (1 per week)  Recheck INR in 2 weeks.  Afib Ablation on 6/5  Anticoagulation Clinic [phone]  *Amiodarone  200mg  daily*

## 2023-06-18 ENCOUNTER — Telehealth: Payer: Self-pay | Admitting: Cardiology

## 2023-06-18 DIAGNOSIS — R079 Chest pain, unspecified: Secondary | ICD-10-CM

## 2023-06-18 MED ORDER — SACUBITRIL-VALSARTAN 24-26 MG PO TABS: 1.0000 | ORAL_TABLET | Freq: Two times a day (BID) | ORAL | 3 refills | Status: AC

## 2023-06-18 MED ORDER — ISOSORBIDE MONONITRATE ER 60 MG PO TB24: 60.0000 mg | ORAL_TABLET | Freq: Every day | ORAL | 3 refills | Status: AC

## 2023-06-18 NOTE — Telephone Encounter (Signed)
*  STAT* If patient is at the pharmacy, call can be transferred to refill team.   1. Which medications need to be refilled? (please list name of each medication and dose if known)   sacubitril -valsartan  (ENTRESTO ) 24-26 MG    isosorbide  mononitrate (IMDUR ) 60 MG 24 hr tablet   2. Which pharmacy/location (including street and city if local pharmacy) is medication to be sent to? [facility] [address], [place] - [address] Phone: [phone]  Fax: [phone]     3. Do they need a 30 day or 90 day supply? 90 Pt is out of one of the medications

## 2023-06-18 NOTE — Telephone Encounter (Signed)
 Pt's medications were sent to pt's pharmacy as requested. Confirmation received.

## 2023-06-19 NOTE — Telephone Encounter (Signed)
 Tobb, Kardie, DO      06/18/23  9:54 PM Ok to send her refill    Noted  Refill sent yesterday

## 2023-06-24 ENCOUNTER — Encounter: Payer: Self-pay | Admitting: Cardiology

## 2023-06-25 ENCOUNTER — Other Ambulatory Visit: Payer: Self-pay

## 2023-06-25 DIAGNOSIS — G4733 Obstructive sleep apnea (adult) (pediatric): Secondary | ICD-10-CM | POA: Diagnosis not present

## 2023-06-25 MED ORDER — NITROGLYCERIN 0.4 MG SL SUBL: 0.4000 mg | SUBLINGUAL_TABLET | SUBLINGUAL | 5 refills | Status: AC | PRN

## 2023-06-25 NOTE — Progress Notes (Signed)
 NTG prescription sent to pt's pharmacy.

## 2023-06-26 DIAGNOSIS — Z7901 Long term (current) use of anticoagulants: Secondary | ICD-10-CM | POA: Diagnosis not present

## 2023-06-26 DIAGNOSIS — I480 Paroxysmal atrial fibrillation: Secondary | ICD-10-CM | POA: Diagnosis not present

## 2023-06-26 DIAGNOSIS — Z952 Presence of prosthetic heart valve: Secondary | ICD-10-CM | POA: Diagnosis not present

## 2023-06-27 NOTE — Addendum Note (Signed)
 Addended by: Lott Rouleau A on: 06/27/2023 09:45 AM   Modules accepted: Orders
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
## 2023-06-27 NOTE — Progress Notes (Signed)
 Remote ICD transmission.

## 2023-06-30 ENCOUNTER — Telehealth: Payer: Self-pay

## 2023-06-30 ENCOUNTER — Other Ambulatory Visit: Payer: Self-pay

## 2023-06-30 DIAGNOSIS — I4819 Other persistent atrial fibrillation: Secondary | ICD-10-CM

## 2023-06-30 DIAGNOSIS — Z01812 Encounter for preprocedural laboratory examination: Secondary | ICD-10-CM

## 2023-06-30 MED ORDER — PREDNISONE 50 MG PO TABS: ORAL_TABLET | ORAL | 0 refills | Status: DC

## 2023-06-30 NOTE — Telephone Encounter (Signed)
 Called pt and went over CT/Ablation instructions. She will get labs done at [facility] office on 5/13. They will print our instruction letters for pt and give to her.   Prednisone  Rx will be sent to [facility] per pt's request to take prior to CT for Contrast allergy .

## 2023-07-01 ENCOUNTER — Ambulatory Visit: Admitting: Psychiatry

## 2023-07-01 ENCOUNTER — Ambulatory Visit: Attending: Cardiology

## 2023-07-01 ENCOUNTER — Telehealth (HOSPITAL_COMMUNITY): Payer: Self-pay

## 2023-07-01 DIAGNOSIS — I480 Paroxysmal atrial fibrillation: Secondary | ICD-10-CM

## 2023-07-01 DIAGNOSIS — Z5181 Encounter for therapeutic drug level monitoring: Secondary | ICD-10-CM

## 2023-07-01 DIAGNOSIS — Z01812 Encounter for preprocedural laboratory examination: Secondary | ICD-10-CM | POA: Diagnosis not present

## 2023-07-01 DIAGNOSIS — I4819 Other persistent atrial fibrillation: Secondary | ICD-10-CM | POA: Diagnosis not present

## 2023-07-01 LAB — POCT INR: INR: 2.4 (ref 2.0–3.0)

## 2023-07-01 NOTE — Patient Instructions (Signed)
 Description   Take 7.5mg  today and then continue taking  5mg  daily except 7.5mg  on Mondays.   Stay consistent with greens each week (1 per week)  Recheck INR in 1 week - NEEDS WEEKLY CHECKS FOR AFIB ABLATION   Afib Ablation on 6/5  Anticoagulation Clinic [phone]  *Amiodarone  200mg  daily*

## 2023-07-01 NOTE — Telephone Encounter (Addendum)
 Spoke with patient to complete pre-procedure call.     New medical conditions? No  Recent hospitalizations or surgeries? No Started any new medications? No Patient made aware to contact office to inform of any new medications started. Any changes in activities of daily living? No  Pre-procedure testing scheduled: Lab work completed today, 5/13 and CT scan 5/15. Pt has a documented allergic reaction to contrast media. She is hesitant to take Prednisone  because it makes her INR extremely out of range and does not want this to interfere with her procedure. Reports she has only taken Benadryl  in the past prior to imaging with contrast and tolerated.    Confirmed patient is taking Warfarin daily as directed and will continue taking medication before procedure or it may need to be rescheduled.  Confirmed patient is scheduled for Atrial Fibrillation Ablation on Thursday, June 5 with Dr. Agatha Horsfall. Instructed patient to arrive at the Main Entrance A at [facility]: [address] [place], [place] [address] and check in at Admitting at 8:00 AM.  Advised of plan to go home the same day and will only stay overnight if medically necessary. You MUST have a responsible adult to drive you home and MUST be with you the first 24 hours after you arrive home or your procedure could be cancelled.  Patient verbalized understanding to information provided and is agreeable to proceed with procedure.

## 2023-07-02 ENCOUNTER — Ambulatory Visit: Payer: Self-pay | Admitting: *Deleted

## 2023-07-02 LAB — BASIC METABOLIC PANEL WITH GFR
BUN/Creatinine Ratio: 17 (ref 12–28)
BUN: 25 mg/dL (ref 8–27)
CO2: 24 mmol/L (ref 20–29)
Calcium: 10 mg/dL (ref 8.7–10.3)
Chloride: 98 mmol/L (ref 96–106)
Creatinine, Ser: 1.44 mg/dL — ABNORMAL HIGH (ref 0.57–1.00)
Glucose: 114 mg/dL — ABNORMAL HIGH (ref 70–99)
Potassium: 4.9 mmol/L (ref 3.5–5.2)
Sodium: 138 mmol/L (ref 134–144)
eGFR: 41 mL/min/{1.73_m2} — ABNORMAL LOW (ref >59)

## 2023-07-02 LAB — CBC
Hematocrit: 38.4 % (ref 34.0–46.6)
Hemoglobin: 11.6 g/dL (ref 11.1–15.9)
MCH: 24.8 pg — ABNORMAL LOW (ref 26.6–33.0)
MCHC: 30.2 g/dL — ABNORMAL LOW (ref 31.5–35.7)
MCV: 82 fL (ref 79–97)
Platelets: 219 10*3/uL (ref 150–450)
RBC: 4.68 x10E6/uL (ref 3.77–5.28)
RDW: 16.9 % — ABNORMAL HIGH (ref 11.7–15.4)
WBC: 6.5 10*3/uL (ref 3.4–10.8)

## 2023-07-02 NOTE — Telephone Encounter (Signed)
 Per Laurell Pond., RN, since she has a documented hives allergy  to IV contrast, we cannot do a CT study involving contrast without prednisone . Dr. [name] advised, ok would hold off on CT and if she is in sinus or hasn't missed a dose of OAC in 4 weeks can avoid TEE.  Informed patient of the above information and CT for 5/15 will be cancelled. Patient verbalized understanding.

## 2023-07-03 ENCOUNTER — Ambulatory Visit (HOSPITAL_COMMUNITY): Admission: RE | Admit: 2023-07-03 | Source: Ambulatory Visit

## 2023-07-03 ENCOUNTER — Other Ambulatory Visit (HOSPITAL_COMMUNITY)

## 2023-07-07 DIAGNOSIS — G4733 Obstructive sleep apnea (adult) (pediatric): Secondary | ICD-10-CM | POA: Diagnosis not present

## 2023-07-07 DIAGNOSIS — G253 Myoclonus: Secondary | ICD-10-CM | POA: Diagnosis not present

## 2023-07-08 ENCOUNTER — Telehealth: Payer: Self-pay | Admitting: *Deleted

## 2023-07-08 DIAGNOSIS — I480 Paroxysmal atrial fibrillation: Secondary | ICD-10-CM

## 2023-07-08 DIAGNOSIS — G253 Myoclonus: Secondary | ICD-10-CM | POA: Diagnosis not present

## 2023-07-08 MED ORDER — CLONAZEPAM 0.5 MG PO TABS
0.5000 mg | ORAL_TABLET | Freq: Every day | ORAL | 0 refills | Status: AC | PRN
Start: 2023-07-10 — End: 2023-08-09

## 2023-07-08 NOTE — Telephone Encounter (Signed)
 Called pt since INR is due today and have been awaiting her labs. She stated she called the main number today and was told she could just go to the Anticoagulation Clinic in [place] on Friday when she comes without an appointment. Advised pt that the Anticoag Clinic is by appt only and we do not have any open slots. She states she will go to [facility] lab tomorrow morning. Advised will order labs since not done. She was thankful for the above information and confirmed she will go to the lab in the morning.

## 2023-07-09 ENCOUNTER — Telehealth: Payer: Self-pay | Admitting: *Deleted

## 2023-07-09 DIAGNOSIS — I480 Paroxysmal atrial fibrillation: Secondary | ICD-10-CM | POA: Diagnosis not present

## 2023-07-09 NOTE — Telephone Encounter (Signed)
 Called pt to confirm that she went to have INR done and she states she did. Will await result and follow up.

## 2023-07-10 ENCOUNTER — Ambulatory Visit (INDEPENDENT_AMBULATORY_CARE_PROVIDER_SITE_OTHER): Admitting: Cardiology

## 2023-07-10 DIAGNOSIS — I4891 Unspecified atrial fibrillation: Secondary | ICD-10-CM

## 2023-07-10 DIAGNOSIS — Z5181 Encounter for therapeutic drug level monitoring: Secondary | ICD-10-CM | POA: Diagnosis not present

## 2023-07-10 LAB — PROTIME-INR
INR: 3.6 — ABNORMAL HIGH (ref 0.9–1.2)
Prothrombin Time: 36.4 s — ABNORMAL HIGH (ref 9.1–12.0)

## 2023-07-10 MED ORDER — WARFARIN SODIUM 5 MG PO TABS: ORAL_TABLET | ORAL | 1 refills | Status: DC

## 2023-07-10 NOTE — Patient Instructions (Signed)
 Description   Called and spoke to pt and instructed her to continue taking warfarin  5mg  daily except 7.5mg  on Mondays.  Eat an extra serving of greens today.  Recheck INR in 1 week -  Afib Ablation on 6/5  Anticoagulation Clinic [phone]  *Amiodarone  200mg  daily*

## 2023-07-11 ENCOUNTER — Encounter: Payer: Self-pay | Admitting: Physical Medicine & Rehabilitation

## 2023-07-11 ENCOUNTER — Other Ambulatory Visit (HOSPITAL_COMMUNITY): Payer: Self-pay

## 2023-07-11 ENCOUNTER — Telehealth: Payer: Self-pay

## 2023-07-11 ENCOUNTER — Ambulatory Visit: Attending: Cardiology | Admitting: Cardiology

## 2023-07-11 ENCOUNTER — Encounter: Payer: Self-pay | Admitting: Cardiology

## 2023-07-11 VITALS — BP 110/82 | HR 77 | Ht 65.0 in | Wt 207.4 lb

## 2023-07-11 DIAGNOSIS — Z952 Presence of prosthetic heart valve: Secondary | ICD-10-CM | POA: Diagnosis not present

## 2023-07-11 DIAGNOSIS — I472 Ventricular tachycardia, unspecified: Secondary | ICD-10-CM

## 2023-07-11 DIAGNOSIS — I25118 Atherosclerotic heart disease of native coronary artery with other forms of angina pectoris: Secondary | ICD-10-CM | POA: Diagnosis not present

## 2023-07-11 DIAGNOSIS — E785 Hyperlipidemia, unspecified: Secondary | ICD-10-CM | POA: Diagnosis not present

## 2023-07-11 DIAGNOSIS — N183 Chronic kidney disease, stage 3 unspecified: Secondary | ICD-10-CM | POA: Diagnosis not present

## 2023-07-11 DIAGNOSIS — I480 Paroxysmal atrial fibrillation: Secondary | ICD-10-CM

## 2023-07-11 MED ORDER — AMIODARONE HCL 400 MG PO TABS: 400.0000 mg | ORAL_TABLET | Freq: Two times a day (BID) | ORAL | 3 refills | Status: DC

## 2023-07-11 MED ORDER — METOLAZONE 2.5 MG PO TABS
2.5000 mg | ORAL_TABLET | Freq: Once | ORAL | 0 refills | Status: DC
  Filled 2023-07-11: qty 1, 1d supply, fill #0

## 2023-07-11 MED ORDER — OXYCODONE-ACETAMINOPHEN 5-325 MG PO TABS: 1.0000 | ORAL_TABLET | Freq: Two times a day (BID) | ORAL | 0 refills | Status: DC | PRN

## 2023-07-11 MED ORDER — FUROSEMIDE 40 MG PO TABS: 40.0000 mg | ORAL_TABLET | Freq: Two times a day (BID) | ORAL | 3 refills | Status: AC

## 2023-07-11 NOTE — Telephone Encounter (Signed)
 She should have these refills available. Please contact the pharmacy and provide an update. Thanks.

## 2023-07-11 NOTE — Telephone Encounter (Signed)
  I am out of my Oxyodone  pain medication. Send to [facility] in [place], [place] .  She also stated she has only one table on hand.

## 2023-07-11 NOTE — Progress Notes (Signed)
 Cardiology Office Note:    Date:  07/12/2023   ID:  Kimberly Hobbs, DOB [birth year], MRN [id number]  PCP:  Riley Cheadle, PA  Cardiologist:  Jerryl Morin, DO  Electrophysiologist:  Lei Pump, MD   Referring MD: Riley Cheadle, PA   " I am doing a lot better"   History of Present Illness:    Kimberly Hobbs is a 63 y.o. 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  Kimberly Hobbs experiences weight gain, attributed to water retention, with an increase from 204 to 207 pounds. She is on furosemide  40 mg twice daily and potassium supplements.   She has atrial fibrillation and takes amiodarone  400 mg twice daily. She perceives changes in her heart rhythm without chest pain and is awaiting an ablation procedure.  Since her last visit she is status post TEE/cardioversion. She reports that she had a few short intermittent episodes of atrial fibrillation. She is looking forward to her   Past Medical History:  Diagnosis Date   Allergy  ?   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   COPD (chronic obstructive pulmonary disease) (HCC)    COVID-19    GERD (gastroesophageal reflux disease)    H/O mitral valve replacement with mechanical #25 mechanical SJM 01/01/2017) 01/01/2017   Hyperlipidemia    Hypertension    Pain in both lower extremities 07/23/2019   Paroxysmal atrial fibrillation (HCC) 06/29/2015   Rheumatic heart disease    MS/ AS   Sleep apnea    Status post mechanical 19 mm mechanical regent  AV replacement 01/01/2017 01/01/2017    Past Surgical History:  Procedure Laterality Date   ATRIAL FIBRILLATION ABLATION N/A 09/05/2022   Procedure: ATRIAL FIBRILLATION ABLATION;  Surgeon: Lei Pump, MD;  Location: [facility];  Service: Cardiovascular;  Laterality: N/A;   CARDIAC CATHETERIZATION     CARDIAC VALVE REPLACEMENT     CARDIOVERSION N/A 04/02/2022   Procedure: CARDIOVERSION;  Surgeon: Cody Das, MD;  Location: [facility];  Service: Cardiovascular;  Laterality: N/A;   CARDIOVERSION N/A 04/27/2022   Procedure: CARDIOVERSION;  Surgeon: Cody Das, MD;  Location: [facility];  Service: Cardiovascular;  Laterality: N/A;   CARDIOVERSION N/A 03/27/2023   Procedure: CARDIOVERSION;  Surgeon: Bridgette Campus, MD;  Location: [facility];  Service: Cardiovascular;  Laterality: N/A;   CARDIOVERSION N/A 05/06/2023   Procedure: CARDIOVERSION;  Surgeon: Jerryl Morin, DO;  Location: [facility];  Service: Cardiovascular;  Laterality: N/A;   CARDIOVERSION N/A 06/02/2023   Procedure: CARDIOVERSION;  Surgeon: Hugh Madura, MD;  Location: [facility];  Service: Cardiovascular;  Laterality: N/A;   CHOLECYSTECTOMY     CORONARY ARTERY BYPASS GRAFT     CORONARY/GRAFT ANGIOGRAPHY N/A 08/18/2018   Procedure: CORONARY/GRAFT ANGIOGRAPHY;  Surgeon: Cody Das, MD;  Location: [facility];  Service: Cardiovascular;  Laterality: N/A;   ELECTROPHYSIOLOGY STUDY N/A 02/04/2023   Procedure: ELECTROPHYSIOLOGY STUDY;  Surgeon: Lei Pump, MD;  Location: [facility] INVASIVE  CV LAB;  Service: Cardiovascular;  Laterality: N/A;   ICD IMPLANT N/A 02/10/2023   Procedure: ICD IMPLANT;  Surgeon: Lei Pump, MD;  Location: [facility] INVASIVE CV LAB;  Service: Cardiovascular;  Laterality: N/A;   LEG SURGERY     "metal plate in leg"   RIGHT HEART CATH AND CORONARY/GRAFT ANGIOGRAPHY N/A 07/21/2018   Procedure: RIGHT HEART CATH AND CORONARY/GRAFT  ANGIOGRAPHY;  Surgeon: Cody Das, MD;  [facility]: [facility];  Service: Cardiovascular;  Laterality: N/A;   TEE WITHOUT CARDIOVERSION N/A 07/27/2015   Procedure: TRANSESOPHAGEAL ECHOCARDIOGRAM (TEE);  Surgeon: Luana Rumple, MD;  Location: [facility] ENDOSCOPY;  Service: Cardiovascular;  Laterality: N/A;   TEE WITHOUT CARDIOVERSION N/A 07/21/2018   Procedure: TRANSESOPHAGEAL ECHOCARDIOGRAM (TEE);  Surgeon: Cody Das, MD;  Location: [facility] ENDOSCOPY;  Service: Cardiovascular;  Laterality: N/A;   TONSILLECTOMY AND ADENOIDECTOMY     TRANSESOPHAGEAL ECHOCARDIOGRAM (CATH LAB) N/A 05/06/2023   Procedure: TRANSESOPHAGEAL ECHOCARDIOGRAM;  Surgeon: Jerryl Morin, DO;  [facility]: [facility];  Service: Cardiovascular;  Laterality: N/A;   TRANSESOPHAGEAL ECHOCARDIOGRAM (CATH LAB) N/A 06/02/2023   Procedure: TRANSESOPHAGEAL ECHOCARDIOGRAM;  Surgeon: Hugh Madura, MD;  [facility]: [facility];  Service: Cardiovascular;  Laterality: N/A;    Current Medications: Current Meds  Medication Sig   Accu-Chek Softclix Lancets lancets Use as instructed (Patient taking differently: 1 each by Other route See admin instructions. Use as instructed)   acetaminophen  (TYLENOL ) 500 MG tablet Take 1,000 mg by mouth every 6 (six) hours as needed for moderate pain (pain score 4-6).   albuterol  (VENTOLIN  HFA) 108 (90 Base) MCG/ACT inhaler Inhale 1-2 puffs into the lungs every 6 (six) hours as needed for wheezing or shortness of breath.   aspirin  EC 81 MG tablet Take 81 mg by mouth daily.    Blood Glucose Monitoring Suppl (ONE TOUCH ULTRA 2) w/Device KIT 3 (three) times daily.   Budeson-Glycopyrrol-Formoterol  (BREZTRI  AEROSPHERE) 160-9-4.8 MCG/ACT AERO Inhale 2 puffs into the lungs in the morning and at bedtime.   clonazePAM  (KLONOPIN ) 0.5 MG tablet Take 1 tablet (0.5 mg total) by mouth daily as needed for anxiety.   gabapentin  (NEURONTIN ) 100 MG capsule Take 1 capsule (100 mg total) by mouth at  bedtime. (Patient taking differently: Take 200 mg by mouth at bedtime.)   Glucose Blood (BLOOD GLUCOSE TEST STRIPS) STRP 1 each by In Vitro route in the morning, at noon, and at bedtime. May substitute to any manufacturer covered by patient's insurance.   hydrOXYzine  (ATARAX ) 50 MG tablet Take 1 tablet (50 mg total) by mouth daily as needed for anxiety. (Patient taking differently: Take 50 mg by mouth at bedtime.)   isosorbide  mononitrate (IMDUR ) 60 MG 24 hr tablet Take 1 tablet (60 mg total) by mouth daily.   Magnesium  250 MG TABS Take 250 mg by mouth daily.   magnesium  oxide (MAG-OX) 400 (240 Mg) MG tablet Take 1 tablet (400 mg total) by mouth daily.   metFORMIN  (GLUCOPHAGE ) 500 MG tablet Take 1 tablet (500 mg total) by mouth 2 (two) times daily with a meal.   metolazone (ZAROXOLYN) 2.5 MG tablet Take 1 tablet (2.5 mg total) by mouth once for 1 dose. Take on Monday (5/26) 30 minutes prior to lasix  dose.   metoprolol  succinate (TOPROL -XL) 100 MG 24 hr tablet Take 1 tablet (100 mg total) by mouth 2 (two) times daily.   nitroGLYCERIN  (NITROSTAT ) 0.4 MG SL tablet Place 1 tablet (0.4 mg total) under the tongue every 5 (five)  minutes as needed for chest pain. Up to 3 times.   pantoprazole  (PROTONIX ) 40 MG tablet Take 1 tablet (40 mg total) by mouth 2 (two) times daily.   potassium chloride  SA (KLOR-CON  M) 20 MEQ tablet Take 1 tablet (20 mEq total) by mouth daily. Take 2 tablets once at dinner 03/26/23 and then 1 tablet daily starting 03/27/23 (Patient taking differently: Take 20 mEq by mouth daily.)   rosuvastatin  (CRESTOR ) 40 MG tablet TAKE 1 TABLET(40 MG) BY MOUTH DAILY   sacubitril -valsartan  (ENTRESTO ) 24-26 MG Take 1 tablet by mouth 2 (two) times daily.   sertraline  (ZOLOFT ) 50 MG tablet Take 1 tablet (50 mg total) by mouth at bedtime. (Patient taking differently: Take 100 mg by mouth at bedtime.)   warfarin (COUMADIN ) 5 MG tablet Take Warfarin 5 mg by mouth daily except for  7.5 mg on Mondays or as  directed by the coumadin  clinic.   warfarin (COUMADIN ) 7.5 MG tablet Take 7.5 mg by mouth See admin instructions. Tues, Fri, and Sun   [DISCONTINUED] amiodarone  (PACERONE ) 200 MG tablet Take 1 tablet (200 mg total) by mouth 2 (two) times daily.   [DISCONTINUED] furosemide  (LASIX ) 40 MG tablet Take 1 tablet (40 mg total) by mouth daily.   [DISCONTINUED] furosemide  (LASIX ) 40 MG tablet Take 1 tablet (40 mg total) by mouth daily.   [DISCONTINUED] oxyCODONE -acetaminophen  (PERCOCET) 5-325 MG tablet Take 1 tablet by mouth every 12 (twelve) hours as needed for severe pain (pain score 7-10).     Allergies:   Duloxetine  hcl, Iodinated contrast media, Penicillins, and Doxycycline    Social History   Socioeconomic History   Marital status: Divorced    Spouse name: Not on file   Number of children: 4   Years of education: Not on file   Highest education level: GED or equivalent  Occupational History   Not on file  Tobacco Use   Smoking status: Former    Current packs/day: 0.00    Average packs/day: 1.6 packs/day for 50.0 years (80.0 ttl pk-yrs)    Types: Cigarettes    Start date: 12/23/1976    Quit date: 12/23/2016    Years since quitting: 6.5    Passive exposure: Past   Smokeless tobacco: Never   Tobacco comments:    Former smoker 01/24/23  Vaping Use   Vaping status: Never Used  Substance and Sexual Activity   Alcohol use: Never   Drug use: Yes    Types: Hydrocodone    Sexual activity: Not Currently  Other Topics Concern   Not on file  Social History Narrative   ** Merged History Encounter **       Epworth Sleepiness Scale = 4 (as of 06/28/2015)   Social Drivers of Health   Financial Resource Strain: Low Risk  (10/14/2022)   Overall Financial Resource Strain (CARDIA)    Difficulty of Paying Living Expenses: Not hard at all  Food Insecurity: No Food Insecurity (05/12/2023)   Hunger Vital Sign    Worried About Running Out of Food in the Last Year: Never true    Ran Out of Food in  the Last Year: Never true  Transportation Needs: No Transportation Needs (05/12/2023)   PRAPARE - Administrator, Civil Service (Medical): No    Lack of Transportation (Non-Medical): No  Physical Activity: Inactive (10/14/2022)   Exercise Vital Sign    Days of Exercise per Week: 0 days    Minutes of Exercise per Session: 0 min  Stress: Stress Concern Present (10/14/2022)  [employer] of Occupational Health - Occupational Stress Questionnaire    Feeling of Stress : To some extent  Social Connections: Moderately Isolated (10/14/2022)   Social Connection and Isolation Panel [NHANES]    Frequency of Communication with Friends and Family: More than three times a week    Frequency of Social Gatherings with Friends and Family: Never    Attends Religious Services: Never    Database administrator or Organizations: No    Attends Engineer, structural: Never    Marital Status: Living with partner     Family History: The patient's family history includes Alcohol abuse in her father and mother; Anxiety disorder in her father and mother; COPD in her daughter; Cancer in her daughter, father, and mother; Diabetes in her father and mother; Drug abuse in her father and mother; Heart disease in her father and mother; Hyperlipidemia in her mother; Hypertension in her brother, brother, father, mother, and sister; Schizophrenia in her maternal grandmother. There is no history of Breast cancer.  ROS:   Review of Systems  Constitution: Negative for decreased appetite, fever and weight gain.  HENT: Negative for congestion, ear discharge, hoarse voice and sore throat.   Eyes: Negative for discharge, redness, vision loss in right eye and visual halos.  Cardiovascular: Negative for chest pain, dyspnea on exertion, leg swelling, orthopnea and palpitations.  Respiratory: Negative for cough, hemoptysis, shortness of breath and snoring.   Endocrine: Negative for heat intolerance and polyphagia.   Hematologic/Lymphatic: Negative for bleeding problem. Does not bruise/bleed easily.  Skin: Negative for flushing, nail changes, rash and suspicious lesions.  Musculoskeletal: Negative for arthritis, joint pain, muscle cramps, myalgias, neck pain and stiffness.  Gastrointestinal: Negative for abdominal pain, bowel incontinence, diarrhea and excessive appetite.  Genitourinary: Negative for decreased libido, genital sores and incomplete emptying.  Neurological: Negative for brief paralysis, focal weakness, headaches and loss of balance.  Psychiatric/Behavioral: Negative for altered mental status, depression and suicidal ideas.  Allergic/Immunologic: Negative for HIV exposure and persistent infections.    EKGs/Labs/Other Studies Reviewed:    The following studies were reviewed today:   EKG:  The ekg ordered today demonstrates   Recent Labs: 05/05/2023: TSH 9.340 05/09/2023: B Natriuretic Peptide 114.1 05/14/2023: ALT 48; Magnesium  2.4 07/01/2023: BUN 25; Creatinine, Ser 1.44; Hemoglobin 11.6; Platelets 219; Potassium 4.9; Sodium 138  Recent Lipid Panel    Component Value Date/Time   CHOL 144 10/01/2022 1151   TRIG 170.0 (H) 10/01/2022 1151   HDL 50.60 10/01/2022 1151   CHOLHDL 3 10/01/2022 1151   VLDL 34.0 10/01/2022 1151   LDLCALC 59 10/01/2022 1151    Physical Exam:    VS:  BP 110/82 (BP Location: Left Arm, Patient Position: Sitting)   Pulse 77   Ht 5\' 5"  (1.651 m)   Wt 207 lb 6.4 oz (94.1 kg)   LMP 06/20/2015   SpO2 95%   BMI 34.51 kg/m     Wt Readings from Last 3 Encounters:  07/11/23 207 lb 6.4 oz (94.1 kg)  05/14/23 198 lb (89.8 kg)  05/14/23 201 lb 12.8 oz (91.5 kg)     GEN: Well nourished, well developed in no acute distress HEENT: Normal NECK: No JVD; No carotid bruits LYMPHATICS: No lymphadenopathy CARDIAC: S1S2 noted,RRR, no murmurs, rubs, gallops RESPIRATORY:  Clear to auscultation without rales, wheezing or rhonchi  ABDOMEN: Soft, non-tender,  non-distended, +bowel sounds, no guarding. EXTREMITIES: No edema, No cyanosis, no clubbing MUSCULOSKELETAL:  No deformity  SKIN: Warm and dry NEUROLOGIC:  Alert and oriented x 3, non-focal PSYCHIATRIC:  Normal affect, good insight  ASSESSMENT:    1. PAF (paroxysmal atrial fibrillation) (HCC)   2. Atherosclerosis of native coronary artery of native heart with other form of angina pectoris (HCC)   3. Hyperlipidemia LDL goal <70   4. Ventricular tachycardia (HCC)   5. Stage 3 chronic kidney disease, unspecified whether stage 3a or 3b CKD (HCC)   6. H/O mitral valve replacement with mechanical valve    PLAN:    Atrial Fibrillation Intermittent atrial fibrillation without chest pain. Sensitive to heart rhythm changes. Scheduled for ablation on June 5th. Current medications include amiodarone , metoprolol , isosorbide , and Coumadin . Second ablation expected to be more effective. - Continue amiodarone  400 mg twice daily. - Refill amiodarone  prescription. - Proceed with scheduled ablation on June 5th.  Diuretic Therapy Management On Lasix  with potassium supplementation. Metolazone prescribed to enhance diuretic effect before ablation. - Continue Lasix  twice daily. - Administer metolazone 2.5 mg on a day when not leaving the house, 30 minutes before first Lasix  dose.  CAD no angina   Mechanical mitral valve - continue current coumadin  dose per the coumadin  clinic. She follows with the [place] office for this.   HLN - cont current statin dose   VT - s/p ICD contin ue to monitor   CKD - continue to monitor cr and avoid nephro toxins  Weight Gain Recent 3-pound weight gain likely due to fluid retention. Current weight 207 pounds. - Administer metolazone 2.5 mg on a day when not leaving the house, 30 minutes before first Lasix  dose.  Depression On Zoloft  for depression management. - Continue current Zoloft  regimen  The patient is in agreement with the above plan. The patient left the  office in stable condition.  The patient will follow up in   Medication Adjustments/Labs and Tests Ordered: Current medicines are reviewed at length with the patient today.  Concerns regarding medicines are outlined above.  No orders of the defined types were placed in this encounter.  Meds ordered this encounter  Medications   amiodarone  (PACERONE ) 400 MG tablet    Sig: Take 1 tablet (400 mg total) by mouth 2 (two) times daily.    Dispense:  180 tablet    Refill:  3   furosemide  (LASIX ) 40 MG tablet    Sig: Take 1 tablet (40 mg total) by mouth 2 (two) times daily.    Dispense:  180 tablet    Refill:  3   metolazone (ZAROXOLYN) 2.5 MG tablet    Sig: Take 1 tablet (2.5 mg total) by mouth once for 1 dose. Take on Monday (5/26) 30 minutes prior to lasix  dose.    Dispense:  1 tablet    Refill:  0    Patient Instructions  Medication Instructions:  Your physician has recommended you make the following change in your medication:   -Take metolazone (ZAROXOLYN) 2.5mg  once on Monday, 30 minutes prior to your furosemide  (lasix ) dose.  *If you need a refill on your cardiac medications before your next appointment, please call your pharmacy*   Follow-Up: At [facility], you and your health needs are our priority.  As part of our continuing mission to provide you with exceptional heart care, our providers are all part of one team.  This team includes your primary Cardiologist (physician) and Advanced Practice Providers or APPs (Physician Assistants and Nurse Practitioners) who all work together to provide you with the care you need, when you need it.  Your next appointment:   6 month(s)  Provider:   Jenesys Casseus, DO    We recommend signing up for the patient portal called "MyChart".  Sign up information is provided on this After Visit Summary.  MyChart is used to connect with patients for Virtual Visits (Telemedicine).  Patients are able to view lab/test results, encounter notes,  upcoming appointments, etc.  Non-urgent messages can be sent to your provider as well.   To learn more about what you can do with MyChart, go to [email or web address].    Adopting a Healthy Lifestyle.  Know what a healthy weight is for you (roughly BMI <25) and aim to maintain this   Aim for 7+ servings of fruits and vegetables daily   65-80+ fluid ounces of water or unsweet tea for healthy kidneys   Limit to max 1 drink of alcohol per day; avoid smoking/tobacco   Limit animal fats in diet for cholesterol and heart health - choose grass fed whenever available   Avoid highly processed foods, and foods high in saturated/trans fats   Aim for low stress - take time to unwind and care for your mental health   Aim for 150 min of moderate intensity exercise weekly for heart health, and weights twice weekly for bone health   Aim for 7-9 hours of sleep daily   When it comes to diets, agreement about the perfect plan isnt easy to find, even among the experts. Experts at the [facility] of [employer] developed an idea known as the Healthy Eating Plate. Just imagine a plate divided into logical, healthy portions.   The emphasis is on diet quality:   Load up on vegetables and fruits - one-half of your plate: Aim for color and variety, and remember that potatoes dont count.   Go for whole grains - one-quarter of your plate: Whole wheat, barley, wheat berries, quinoa, oats, brown rice, and foods made with them. If you want pasta, go with whole wheat pasta.   Protein power - one-quarter of your plate: Fish, chicken, beans, and nuts are all healthy, versatile protein sources. Limit red meat.   The diet, however, does go beyond the plate, offering a few other suggestions.   Use healthy plant oils, such as olive, canola, soy, corn, sunflower and peanut. Check the labels, and avoid partially hydrogenated oil, which have unhealthy trans fats.   If youre thirsty, drink water. Coffee and  tea are good in moderation, but skip sugary drinks and limit milk and dairy products to one or two daily servings.   The type of carbohydrate in the diet is more important than the amount. Some sources of carbohydrates, such as vegetables, fruits, whole grains, and beans-are healthier than others.   Finally, stay active  Signed, Oma Marzan, DO  07/12/2023 1:51 PM    [facility]

## 2023-07-11 NOTE — Patient Instructions (Addendum)
 Medication Instructions:  Your physician has recommended you make the following change in your medication:   -Take metolazone (ZAROXOLYN) 2.5mg  once on Monday, 30 minutes prior to your furosemide  (lasix ) dose.  *If you need a refill on your cardiac medications before your next appointment, please call your pharmacy*   Follow-Up: At [facility], you and your health needs are our priority.  As part of our continuing mission to provide you with exceptional heart care, our providers are all part of one team.  This team includes your primary Cardiologist (physician) and Advanced Practice Providers or APPs (Physician Assistants and Nurse Practitioners) who all work together to provide you with the care you need, when you need it.  Your next appointment:   6 month(s)  Provider:   Kardie Tobb, DO    We recommend signing up for the patient portal called "MyChart".  Sign up information is provided on this After Visit Summary.  MyChart is used to connect with patients for Virtual Visits (Telemedicine).  Patients are able to view lab/test results, encounter notes, upcoming appointments, etc.  Non-urgent messages can be sent to your provider as well.   To learn more about what you can do with MyChart, go to [email or web address].

## 2023-07-13 ENCOUNTER — Other Ambulatory Visit: Payer: Self-pay | Admitting: Family Medicine

## 2023-07-15 ENCOUNTER — Encounter: Payer: 59 | Attending: Physical Medicine & Rehabilitation | Admitting: Physical Medicine & Rehabilitation

## 2023-07-15 ENCOUNTER — Telehealth: Payer: Self-pay

## 2023-07-15 ENCOUNTER — Other Ambulatory Visit: Payer: Self-pay | Admitting: Physical Medicine & Rehabilitation

## 2023-07-15 ENCOUNTER — Encounter: Payer: Self-pay | Admitting: Physical Medicine & Rehabilitation

## 2023-07-15 ENCOUNTER — Ambulatory Visit: Attending: Cardiology

## 2023-07-15 VITALS — BP 97/67 | HR 79 | Ht 65.0 in | Wt 205.0 lb

## 2023-07-15 DIAGNOSIS — M545 Low back pain, unspecified: Secondary | ICD-10-CM

## 2023-07-15 DIAGNOSIS — Z5181 Encounter for therapeutic drug level monitoring: Secondary | ICD-10-CM

## 2023-07-15 DIAGNOSIS — F411 Generalized anxiety disorder: Secondary | ICD-10-CM

## 2023-07-15 DIAGNOSIS — G8929 Other chronic pain: Secondary | ICD-10-CM | POA: Diagnosis not present

## 2023-07-15 DIAGNOSIS — G894 Chronic pain syndrome: Secondary | ICD-10-CM

## 2023-07-15 LAB — POCT INR: INR: 5.1 — AB (ref 2.0–3.0)

## 2023-07-15 MED ORDER — OXYCODONE-ACETAMINOPHEN 5-325 MG PO TABS: 1.0000 | ORAL_TABLET | Freq: Two times a day (BID) | ORAL | 0 refills | Status: DC | PRN

## 2023-07-15 MED ORDER — BUPRENORPHINE 5 MCG/HR TD PTWK: 1.0000 | MEDICATED_PATCH | TRANSDERMAL | 0 refills | Status: DC

## 2023-07-15 NOTE — Progress Notes (Addendum)
 Subjective:    Patient ID: Kimberly Hobbs, female    DOB: May 16, 1960, 63 y.o.   MRN: [id number]  HPI  Kimberly Hobbs is a 63 y.o. year old female  who  has a past medical history of CKD stage 3 due to type 2 diabetes mellitus (HCC) (05/15/2021), COVID-19, Hyperlipidemia, Hypertension, Pain in both lower extremities (07/23/2019), and Rheumatic heart disease.   They are presenting to PM&R clinic as a new patient for pain management evaluation. They were referred by [name] for treatment of low back pain.  She cant stand for long. Has to use a walker.  Pain is severe if she walks without this. Pain started about 2 years ago but is worsening. 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  Pain is mostly on the right side of her lower back. Sometimes both sides. It doesn't shoot down her legs.  Lying down helps the pain.  She reports her activities are very limited by her pain.  She has been taking hydrocodone  5/325 1 pain is very severe. History of prosthetic valves, schedule for ablation for A-fib       Red flag symptoms: No red flags for back pain endorsed in Hx or ROS   Medications tried: Topical medications - doesn't recall name, didn't help Nsaids- cannot take for cardiac reasons Tylenol - doesn't help Opiates  Hydrocodone  -she uses sparingly when pain is very severe, this does help control her pain Gabapentin  / Lyrica  Has not tried  Tramadol -does not remember TCAs  - Denies  She is currently on Celexa  for her anxiety, this medication visit has been working very well for her Robaxin  helps pain   Other treatments: PT/OT  has not tried, in afib waiting on heart ablation  TENs unit - has not tried  Injections-denies Surgery - denies      Interal History 08/23/22   Kimberly Hobbs is here for follow-up regarding her chronic pain.  She reports she continues to have right-sided back pain.  She also has some aching pain in her  legs bilaterally.  She reports she has her cardiac ablation procedures 09/05/2022 so after this time she would be interested in getting facet joint injections.  Pain is worsened with standing and she continues to use her walker for ambulation.  Pain continues to be improved with lying down.  Vicodin 7.5 is helping her control this pain.  She tried to use this sparingly only when pain is very severe and 30 tablets has lasted her about a month and a half.  She is not having any side effects with this medication.   Interal History 10/29/2022 Kimberly Hobbs is here for follow-up regarding her right-sided back pain with aching pain in her legs.  She continues to use occasional Vicodin 7.5 which helps keep her pain controlled when it is very severe.  She tries to use this sparingly.  She has been started on gabapentin  100 mg at bedtime which has greatly reduced her bilateral leg pain.  She reports her Celexa  was discontinued because it was no longer helping.  She says she is currently not on any antidepressant medicines for anxiety.  She has had a few sessions of physical therapy but has not had significant benefits at this time. Patient has been using occasional Flexeril  10 mg with benefit, she does not use this when she takes for Norco.       Interal History 01/07/23 [name] is here for follow-up regarding her right-sided back pain and leg pain.  She continues to use Vicodin 7.5 mg sparingly when the pain is severe.  She still has most of her pills from her last refill.  Leg pain continues to be improved since starting gabapentin .  She stopped using Cymbalta , says she did not tolerate this and made her feel bad.  She reports pain is not significantly improved after physical therapy.     Interal History 03/19/22 Kimberly Hobbs reports that she has been having worse pain in her lower back more on the right side.  When she does activity she will have pain all the way across her back.  Hydrocodone  not helping pain is  much as it previously did.  Patient is currently following with cardiology working on treatment of her arrhythmia.  She is now restarted on amiodarone .  She had a dual-chamber ICD implanted 02/10/2023.  Interval history 04/17/2023 Kimberly Hobbs is here for follow-up regarding her back pain.  She reports her back pain is doing better, not having help her husband as much as he has been more active himself.  He does have another surgery scheduled.  She is only requiring use of oxycodone  sparingly and it is helping her pain.   Interval history 07/15/2023 Patient reports her last use of oxycodone  was yesterday.  She has been using oxycodone  sparingly, reports it decreases her pain but is not strong enough to fully control her back pain.  She is limited in household activities due to her back pain.  She is not able to do a lot of cooking or cleaning and has to rest a lot because of her pain.  Pain is present all the time but is worsened with activity.  Pain Inventory Average Pain 8 Pain Right Now 1 My pain is sharp and stabbing  In the last 24 hours, has pain interfered with the following? General activity 10 Relation with others 0 Enjoyment of life 10 What TIME of day is your pain at its worst? daytime, evening, and night Sleep (in general) Good  Pain is worse with: walking, bending, standing, and some activites Pain improves with: rest and medication Relief from Meds: 2  Family History  Problem Relation Age of Onset   Drug abuse Mother    Alcohol abuse Mother    Anxiety disorder Mother    Cancer Mother    Hypertension Mother    Diabetes Mother    Hyperlipidemia Mother    Heart disease Mother    Drug abuse Father    Alcohol abuse Father    Anxiety disorder Father    Cancer Father    Hypertension Father    Diabetes Father    Heart disease Father    Hypertension Sister    Hypertension Brother    Hypertension Brother    Schizophrenia Maternal Grandmother    Cancer Daughter    COPD  Daughter    Breast cancer Neg Hx    Social History   Socioeconomic History   Marital status: Divorced    Spouse name: Not on file   Number of children: 4   Years of education: Not on file   Highest education level: GED or equivalent  Occupational History   Not on file  Tobacco Use   Smoking status: Former    Current packs/day: 0.00    Average packs/day: 1.6 packs/day for 50.0 years (80.0 ttl pk-yrs)    Types: Cigarettes    Start date: 12/23/1976  Quit date: 12/23/2016    Years since quitting: 6.5    Passive exposure: Past   Smokeless tobacco: Never   Tobacco comments:    Former smoker 01/24/23  Vaping Use   Vaping status: Never Used  Substance and Sexual Activity   Alcohol use: Never   Drug use: Yes    Types: Hydrocodone    Sexual activity: Not Currently  Other Topics Concern   Not on file  Social History Narrative   ** Merged History Encounter **       Epworth Sleepiness Scale = 4 (as of 06/28/2015)   Social Drivers of Health   Financial Resource Strain: Low Risk  (10/14/2022)   Overall Financial Resource Strain (CARDIA)    Difficulty of Paying Living Expenses: Not hard at all  Food Insecurity: No Food Insecurity (05/12/2023)   Hunger Vital Sign    Worried About Running Out of Food in the Last Year: Never true    Ran Out of Food in the Last Year: Never true  Transportation Needs: No Transportation Needs (05/12/2023)   PRAPARE - Administrator, Civil Service (Medical): No    Lack of Transportation (Non-Medical): No  Physical Activity: Inactive (10/14/2022)   Exercise Vital Sign    Days of Exercise per Week: 0 days    Minutes of Exercise per Session: 0 min  Stress: Stress Concern Present (10/14/2022)   [employer] of Occupational Health - Occupational Stress Questionnaire    Feeling of Stress : To some extent  Social Connections: Moderately Isolated (10/14/2022)   Social Connection and Isolation Panel [NHANES]    Frequency of Communication with  Friends and Family: More than three times a week    Frequency of Social Gatherings with Friends and Family: Never    Attends Religious Services: Never    Database administrator or Organizations: No    Attends Banker Meetings: Never    Marital Status: Living with partner   Past Surgical History:  Procedure Laterality Date   ATRIAL FIBRILLATION ABLATION N/A 09/05/2022   Procedure: ATRIAL FIBRILLATION ABLATION;  Surgeon: Inocencio Soyla Lunger, MD;  [facility]: [facility];  Service: Cardiovascular;  Laterality: N/A;   CARDIAC CATHETERIZATION     CARDIAC VALVE REPLACEMENT     CARDIOVERSION N/A 04/02/2022   Procedure: CARDIOVERSION;  Surgeon: Elmira Newman PARAS, MD;  Location: [facility];  Service: Cardiovascular;  Laterality: N/A;   CARDIOVERSION N/A 04/27/2022   Procedure: CARDIOVERSION;  Surgeon: Elmira Newman PARAS, MD;  Location: [facility];  Service: Cardiovascular;  Laterality: N/A;   CARDIOVERSION N/A 03/27/2023   Procedure: CARDIOVERSION;  Surgeon: Alvan Ronal BRAVO, MD;  Location: [facility];  Service: Cardiovascular;  Laterality: N/A;   CARDIOVERSION N/A 05/06/2023   Procedure: CARDIOVERSION;  Surgeon: Sheena Pugh, DO;  [facility]: [facility];  Service: Cardiovascular;  Laterality: N/A;   CARDIOVERSION N/A 06/02/2023   Procedure: CARDIOVERSION;  Surgeon: Jeffrie Oneil BROCKS, MD;  [facility]: [facility];  Service: Cardiovascular;  Laterality: N/A;   CHOLECYSTECTOMY     CORONARY ARTERY BYPASS GRAFT     CORONARY/GRAFT ANGIOGRAPHY N/A 08/18/2018   Procedure: CORONARY/GRAFT ANGIOGRAPHY;  Surgeon: Elmira Newman PARAS, MD;  [facility];  Service: Cardiovascular;  Laterality: N/A;   ELECTROPHYSIOLOGY STUDY N/A 02/04/2023   Procedure: ELECTROPHYSIOLOGY STUDY;  Surgeon: Inocencio Soyla Lunger, MD;  [facility];  Service: Cardiovascular;  Laterality: N/A;   ICD IMPLANT N/A 02/10/2023   Procedure: ICD  IMPLANT;  Surgeon:  Inocencio Soyla Lunger, MD;  Location: [facility] INVASIVE CV LAB;  Service: Cardiovascular;  Laterality: N/A;   LEG SURGERY     metal plate in leg   RIGHT HEART CATH AND CORONARY/GRAFT ANGIOGRAPHY N/A 07/21/2018   Procedure: RIGHT HEART CATH AND CORONARY/GRAFT ANGIOGRAPHY;  Surgeon: Elmira Newman PARAS, MD;  [facility];  Service: Cardiovascular;  Laterality: N/A;   TEE WITHOUT CARDIOVERSION N/A 07/27/2015   Procedure: TRANSESOPHAGEAL ECHOCARDIOGRAM (TEE);  Surgeon: Jerel Balding, MD;  Location: [facility] ENDOSCOPY;  Service: Cardiovascular;  Laterality: N/A;   TEE WITHOUT CARDIOVERSION N/A 07/21/2018   Procedure: TRANSESOPHAGEAL ECHOCARDIOGRAM (TEE);  Surgeon: Elmira Newman PARAS, MD;  Location: [facility] ENDOSCOPY;  Service: Cardiovascular;  Laterality: N/A;   TONSILLECTOMY AND ADENOIDECTOMY     TRANSESOPHAGEAL ECHOCARDIOGRAM (CATH LAB) N/A 05/06/2023   Procedure: TRANSESOPHAGEAL ECHOCARDIOGRAM;  Surgeon: Sheena Pugh, DO;  [facility];  Service: Cardiovascular;  Laterality: N/A;   TRANSESOPHAGEAL ECHOCARDIOGRAM (CATH LAB) N/A 06/02/2023   Procedure: TRANSESOPHAGEAL ECHOCARDIOGRAM;  Surgeon: Jeffrie Oneil BROCKS, MD;  [facility];  Service: Cardiovascular;  Laterality: N/A;   Past Surgical History:  Procedure Laterality Date   ATRIAL FIBRILLATION ABLATION N/A 09/05/2022   Procedure: ATRIAL FIBRILLATION ABLATION;  Surgeon: Inocencio Soyla Lunger, MD;  [facility];  Service: Cardiovascular;  Laterality: N/A;   CARDIAC CATHETERIZATION     CARDIAC VALVE REPLACEMENT     CARDIOVERSION N/A 04/02/2022   Procedure: CARDIOVERSION;  Surgeon: Elmira Newman PARAS, MD;  [facility];  Service: Cardiovascular;  Laterality: N/A;   CARDIOVERSION N/A 04/27/2022   Procedure: CARDIOVERSION;  Surgeon: Elmira Newman PARAS, MD;  [facility];  Service: Cardiovascular;  Laterality: N/A;   CARDIOVERSION N/A 03/27/2023   Procedure: CARDIOVERSION;   Surgeon: Alvan Ronal BRAVO, MD;  [facility];  Service: Cardiovascular;  Laterality: N/A;   CARDIOVERSION N/A 05/06/2023   Procedure: CARDIOVERSION;  Surgeon: Sheena Pugh, DO;  [facility];  Service: Cardiovascular;  Laterality: N/A;   CARDIOVERSION N/A 06/02/2023   Procedure: CARDIOVERSION;  Surgeon: Jeffrie Oneil BROCKS, MD;  [facility];  Service: Cardiovascular;  Laterality: N/A;   CHOLECYSTECTOMY     CORONARY ARTERY BYPASS GRAFT     CORONARY/GRAFT ANGIOGRAPHY N/A 08/18/2018   Procedure: CORONARY/GRAFT ANGIOGRAPHY;  Surgeon: Elmira Newman PARAS, MD;  [facility];  Service: Cardiovascular;  Laterality: N/A;   ELECTROPHYSIOLOGY STUDY N/A 02/04/2023   Procedure: ELECTROPHYSIOLOGY STUDY;  Surgeon: Inocencio Soyla Lunger, MD;  [facility];  Service: Cardiovascular;  Laterality: N/A;   ICD IMPLANT N/A 02/10/2023   Procedure: ICD IMPLANT;  Surgeon: Inocencio Soyla Lunger, MD;  Location: [facility] INVASIVE CV LAB;  Service: Cardiovascular;  Laterality: N/A;   LEG SURGERY     metal plate in leg   RIGHT HEART CATH AND CORONARY/GRAFT ANGIOGRAPHY N/A 07/21/2018   Procedure: RIGHT HEART CATH AND CORONARY/GRAFT ANGIOGRAPHY;  Surgeon: Elmira Newman PARAS, MD;  [facility];  Service: Cardiovascular;  Laterality: N/A;   TEE WITHOUT CARDIOVERSION N/A 07/27/2015   Procedure: TRANSESOPHAGEAL ECHOCARDIOGRAM (TEE);  Surgeon: Jerel Balding, MD;  Location: [facility] ENDOSCOPY;  Service: Cardiovascular;  Laterality: N/A;   TEE WITHOUT CARDIOVERSION N/A 07/21/2018   Procedure: TRANSESOPHAGEAL ECHOCARDIOGRAM (TEE);  Surgeon: Elmira Newman PARAS, MD;  Location: [facility] ENDOSCOPY;  Service: Cardiovascular;  Laterality: N/A;   TONSILLECTOMY AND ADENOIDECTOMY     TRANSESOPHAGEAL ECHOCARDIOGRAM (CATH LAB) N/A 05/06/2023  Procedure: TRANSESOPHAGEAL ECHOCARDIOGRAM;  Surgeon: Sheena Pugh, DO;  [facility];  Service: Cardiovascular;   Laterality: N/A;   TRANSESOPHAGEAL ECHOCARDIOGRAM (CATH LAB) N/A 06/02/2023   Procedure: TRANSESOPHAGEAL ECHOCARDIOGRAM;  Surgeon: Jeffrie Oneil BROCKS, MD;  [facility];  Service: Cardiovascular;  Laterality: N/A;   Past Medical History:  Diagnosis Date   Allergy  ?   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 01/01/2017 01/01/2017   BP 97/67   Pulse 79   Ht 5' 5 (1.651 m)   Wt 205 lb (93 kg)   LMP 06/20/2015   SpO2 95%   BMI 34.11 kg/m   Opioid Risk Score:   Fall Risk Score:  `1  Depression screen [facility] 2/9     04/14/2023    1:38 PM 01/07/2023    9:27 AM 12/23/2022   10:25 AM 10/14/2022   10:49 AM 08/23/2022   10:32 AM 07/08/2022    9:52 AM 06/10/2022    9:24 AM  Depression screen PHQ 2/9  Decreased Interest  1 0 0 1 0 1  Down, Depressed, Hopeless  1 0 1 1 0 0  PHQ - 2 Score  2 0 1 2 0 1  Altered sleeping   3 0   3  Tired, decreased energy   1 1   2   Change in appetite   3 0   3  Feeling bad or failure about yourself    0 0   0  Trouble concentrating   0 0   0  Moving slowly or fidgety/restless   0 0   0  Suicidal thoughts   0 0   0  PHQ-9 Score   7 2   9   Difficult doing work/chores   Not difficult at all Somewhat difficult   Very difficult     Information is confidential and restricted. Go to Review Flowsheets to unlock data.     Review of Systems  Musculoskeletal:  Positive for back pain.  All other systems reviewed and are  negative.      Objective:   Physical Exam  Gen: no distress, normal appearing HEENT: oral mucosa pink and moist, NCAT Cardio: Reg rate Chest: normal effort, normal rate of breathing Abd: soft, non-distended Ext: no edema Psych: pleasant, normal affect Skin: intact Neuro: Alert and awake, follows commands, cranial nerves II through XII intact, intact insight and judgment, speech and language intact Strength 5 out of 5 in all 4 extremities Sensation intact light touch in all 4 extremities No ankle clonus Musculoskeletal:  Tenderness over paraspinal muscles mid to lower lumbar spine right greater than left Slump test negative bilaterally Pain reported increased pain with with spinal extension Minimal right SI joint tenderness today   Prior exam:  Mild tenderness noted over the right SI joint   MRI L-spine 1/17/2024IMPRESSION: 1. L4-L5 moderate facet arthropathy, which can be a cause of  back pain. Narrowing of the lateral recesses at this level could affect the descending L5 nerve roots. 2. No spinal canal stenosis or neural foraminal narrowing.       Assessment & Plan:   Chronic lower back pain bilateral but worse on the right without radiculopathy -No improvement with Tylenol , unable to take NSAIDs due to cardiac history - Intermittent right SI joint tenderness has been noted, continue to monitor -Pain is primarily axial in her lower back, suspect this may be facet joint mediated -List of foods for pain provided at prior visit -ORT low -TENS unit, nexwave device ordered prior visit -Discontinued Norco 7.5 twice daily prior visit -Cymbalta  20 mg daily- She did not tolerate -She reports Physical Therapy provided minimal improvement -Pain agreement completed prior visit - Continue to mitten use of Flexeril  10 mg up to 3 times daily as needed -Discussed facet joint injections B/L L3-4, L4-5, R L5-S1-I think this would be a good option.  She is currently on anticoagulation  but may be able to come off after a cardiac ablation- she says she has will have this procedure soon and will ask about holding blood thinners after this -Continue UDS, Pill Counts, PDMP monitoring  -She is on gabapentin  for restless leg.  She reports tightness in her legs at night that the gabapentin  reduced.  We had tried 200 mg gabapentin , appears she is back on the 100 mg dose? -She forgot pills prior visit- and had used codeine  cough syrup warning was given at the time -Pill counts consistent today -Continue Percocet 5 twice daily as needed for breakthrough pain -Start burtrans 5mcg/hr path, discussed risks and benefits of the medication   Chronic anxiety, denies SI or HI -No longer on Celexa , Cymbalta - she says it made her feel bad - Continue follow-up with mental health as directed   Butrans  discontinued due to hives/suspected allergy 

## 2023-07-15 NOTE — Telephone Encounter (Signed)
 Outcome Approved today by [facility] Medicare 2017 NCPDP Request Reference Number: [id number]. OXYCOD/APAP TAB 5-325MG  is approved through 02/18/2024. Your patient may now fill this prescription and it will be covered. Effective Date: 07/15/2023 Authorization Expiration Date: 02/18/2024

## 2023-07-15 NOTE — Telephone Encounter (Signed)
 Prior auth initiated with  [employer] through cover my meds. Kimberly Hobbs (Key: [id number]) PA Case ID #: [id number]

## 2023-07-15 NOTE — Patient Instructions (Addendum)
 Description   HOLD today's dose and START taking warfarin 5mg  daily.   Recheck INR in 1 week  Afib Ablation on 6/5  Anticoagulation Clinic [phone]  *Amiodarone  400mg  BID*

## 2023-07-16 DIAGNOSIS — G253 Myoclonus: Secondary | ICD-10-CM | POA: Diagnosis not present

## 2023-07-16 DIAGNOSIS — J441 Chronic obstructive pulmonary disease with (acute) exacerbation: Secondary | ICD-10-CM | POA: Diagnosis not present

## 2023-07-16 DIAGNOSIS — R059 Cough, unspecified: Secondary | ICD-10-CM | POA: Diagnosis not present

## 2023-07-16 DIAGNOSIS — Z20822 Contact with and (suspected) exposure to covid-19: Secondary | ICD-10-CM | POA: Diagnosis not present

## 2023-07-16 DIAGNOSIS — R7989 Other specified abnormal findings of blood chemistry: Secondary | ICD-10-CM | POA: Diagnosis not present

## 2023-07-17 ENCOUNTER — Telehealth (HOSPITAL_COMMUNITY): Payer: Self-pay

## 2023-07-17 NOTE — Telephone Encounter (Signed)
 Spoke with patient to discuss upcoming procedure.   TEE completed.  Labs: completed. Last INR 5.1. Made Dr. [name] aware and would like level between 2-3 for ablation. Recommend patient to have follow up in coumadin  clinic before her ablation to adjust the dose. Patient agrees to contact coumadin  clinic.   Any recent signs of acute illness or been started on antibiotics? PCP started her on a Z-pack yesterday as a precautionary for a cough that started yesterday. Tested negative for Covid, Strep and Flu. Patient feels cough is related to sinus drainage.Denies any fever, chest congestion or any other symptoms. Dr. [name] made aware. Any missed doses of blood thinner? No Advised patient to continue taking ANTICOAGULANT: Coumadin  (Warfarin) daily as directed without missing any doses.  Medication instructions:  On the morning of your procedure DO NOT take any medication., including Warfarin or the procedure may be rescheduled.  Nothing to eat or drink after midnight prior to your procedure.  Confirmed patient is scheduled for Atrial Fibrillation Ablation on Thursday, June 5 with Dr. Agatha Horsfall. Instructed patient to arrive at the Main Entrance A at [facility]: [address] [place], [place] [address] and check in at Admitting at 8:00 AM.  Advised of plan to go home the same day and will only stay overnight if medically necessary. You MUST have a responsible adult to drive you home and MUST be with you the first 24 hours after you arrive home or your procedure could be cancelled.  Patient verbalized understanding to all instructions provided and agreed to proceed with procedure. She also wanted to make Dr. [name] aware that she has back problems and will need something for discomfort to keep her from moving around during/post procedure, like she has received previously.

## 2023-07-18 ENCOUNTER — Encounter: Payer: Self-pay | Admitting: Cardiology

## 2023-07-18 ENCOUNTER — Telehealth: Payer: Self-pay

## 2023-07-18 LAB — TOXASSURE SELECT,+ANTIDEPR,UR

## 2023-07-18 NOTE — Telephone Encounter (Signed)
 Received call from pt stating she checked her INR this morning with her home testing machine and her INR was 3.2. I advised pt to continue taking Warfarin 5mg  daily and will recheck INR at [facility] on Tuesday, 07/22/23. Pt verbalized understanding.   Message sent to Dr [name] and Reece Cane, RN to make them aware of INR results.

## 2023-07-21 ENCOUNTER — Other Ambulatory Visit: Payer: Self-pay | Admitting: Psychiatry

## 2023-07-21 MED ORDER — HYDROXYZINE HCL 50 MG PO TABS: 50.0000 mg | ORAL_TABLET | Freq: Every day | ORAL | 0 refills | Status: DC | PRN

## 2023-07-22 ENCOUNTER — Ambulatory Visit: Attending: Cardiology

## 2023-07-22 ENCOUNTER — Telehealth: Payer: Self-pay | Admitting: Cardiology

## 2023-07-22 DIAGNOSIS — I480 Paroxysmal atrial fibrillation: Secondary | ICD-10-CM | POA: Diagnosis not present

## 2023-07-22 DIAGNOSIS — Z5181 Encounter for therapeutic drug level monitoring: Secondary | ICD-10-CM | POA: Diagnosis not present

## 2023-07-22 LAB — POCT INR: INR: 6.6 — AB (ref 2.0–3.0)

## 2023-07-22 NOTE — Telephone Encounter (Signed)
 Kimberly Hobbs from [place] is calling to report a critical INR on this pt from today.  Per Kimberly Hobbs with [facility], pts INR from today was 6.6   Will forward critical INR value to our Coumadin  Clinic for further management, being they follow in their clinic.

## 2023-07-22 NOTE — Telephone Encounter (Signed)
 Pt is holding warfarin per Anticoagulation Encounter Visit today. [facility] reported STAT INR 5.6 today and states will fax up as well. EP RN, Sherri aware of STAT VP lab results. Pt is aware to continue holding warfarin.

## 2023-07-22 NOTE — Telephone Encounter (Signed)
 Patient's INR today- 6.6. Dr. [name] reviewed the note from Coumadin  clinic, which looks like they are holding her doses. Advised to check INR when she gets to the hospital.

## 2023-07-22 NOTE — Patient Instructions (Signed)
 Description   POC INR 6.6 - STAT PT/INR ordered. Dr [name] and Reece Cane, RN aware of results. Instructed pt to HOLD Warfarin until INR is checked pre-ablation on 07/24/23.  Normal dose: warfarin 5mg  daily.   SEEK IMMEDIATE MEDICAL ATTENTION IF SIGNS OR SYMPTOMS OF BLEEDING OCCUR.  Recheck INR in 1 week post procedure.   Afib Ablation on 6/5  Anticoagulation Clinic [phone]  *Amiodarone  400mg  BID*

## 2023-07-22 NOTE — Telephone Encounter (Signed)
Critical lab report

## 2023-07-23 LAB — PROTIME-INR
INR: 5.6 (ref 0.9–1.2)
Prothrombin Time: 54.7 s — ABNORMAL HIGH (ref 9.1–12.0)

## 2023-07-23 NOTE — Pre-Procedure Instructions (Addendum)
 Instructed patient on the following items: Arrival time 0800 Nothing to eat or drink after midnight No meds AM of procedure Responsible person to drive you home and stay with you for 24 hrs  Have you missed any doses of anti-coagulant Coumadin - didn't take dose last night due to INR high.  She has a machine at home, she checked it, INR today is 4.8.  Will notify Dr [name].  Dr [name] states ok to eat small salad tonight to help bring down a little more.

## 2023-07-24 ENCOUNTER — Encounter (HOSPITAL_COMMUNITY): Payer: Self-pay | Admitting: Cardiology

## 2023-07-24 ENCOUNTER — Ambulatory Visit (HOSPITAL_COMMUNITY)
Admission: RE | Admit: 2023-07-24 | Discharge: 2023-07-24 | Disposition: A | Discharge status: Home / Self Care | Source: Ambulatory Visit | Attending: Cardiology | Admitting: Cardiology

## 2023-07-24 ENCOUNTER — Ambulatory Visit (HOSPITAL_COMMUNITY): Admitting: Anesthesiology

## 2023-07-24 ENCOUNTER — Encounter (HOSPITAL_COMMUNITY)
Admission: RE | Disposition: A | Discharge status: Home / Self Care | Payer: Self-pay | Source: Ambulatory Visit | Attending: Cardiology

## 2023-07-24 ENCOUNTER — Other Ambulatory Visit: Payer: Self-pay

## 2023-07-24 DIAGNOSIS — I4819 Other persistent atrial fibrillation: Secondary | ICD-10-CM | POA: Insufficient documentation

## 2023-07-24 DIAGNOSIS — I129 Hypertensive chronic kidney disease with stage 1 through stage 4 chronic kidney disease, or unspecified chronic kidney disease: Secondary | ICD-10-CM

## 2023-07-24 DIAGNOSIS — I480 Paroxysmal atrial fibrillation: Secondary | ICD-10-CM

## 2023-07-24 DIAGNOSIS — I509 Heart failure, unspecified: Secondary | ICD-10-CM | POA: Diagnosis not present

## 2023-07-24 DIAGNOSIS — G4730 Sleep apnea, unspecified: Secondary | ICD-10-CM | POA: Diagnosis not present

## 2023-07-24 DIAGNOSIS — K219 Gastro-esophageal reflux disease without esophagitis: Secondary | ICD-10-CM | POA: Diagnosis not present

## 2023-07-24 DIAGNOSIS — E119 Type 2 diabetes mellitus without complications: Secondary | ICD-10-CM | POA: Diagnosis not present

## 2023-07-24 DIAGNOSIS — Z951 Presence of aortocoronary bypass graft: Secondary | ICD-10-CM | POA: Diagnosis not present

## 2023-07-24 DIAGNOSIS — I2510 Atherosclerotic heart disease of native coronary artery without angina pectoris: Secondary | ICD-10-CM | POA: Diagnosis not present

## 2023-07-24 DIAGNOSIS — I25118 Atherosclerotic heart disease of native coronary artery with other forms of angina pectoris: Secondary | ICD-10-CM

## 2023-07-24 DIAGNOSIS — Z955 Presence of coronary angioplasty implant and graft: Secondary | ICD-10-CM | POA: Insufficient documentation

## 2023-07-24 DIAGNOSIS — I110 Hypertensive heart disease with heart failure: Secondary | ICD-10-CM | POA: Diagnosis not present

## 2023-07-24 DIAGNOSIS — N183 Chronic kidney disease, stage 3 unspecified: Secondary | ICD-10-CM | POA: Diagnosis not present

## 2023-07-24 DIAGNOSIS — I483 Typical atrial flutter: Secondary | ICD-10-CM

## 2023-07-24 DIAGNOSIS — Z87891 Personal history of nicotine dependence: Secondary | ICD-10-CM | POA: Insufficient documentation

## 2023-07-24 DIAGNOSIS — J449 Chronic obstructive pulmonary disease, unspecified: Secondary | ICD-10-CM | POA: Diagnosis not present

## 2023-07-24 DIAGNOSIS — I4891 Unspecified atrial fibrillation: Secondary | ICD-10-CM

## 2023-07-24 DIAGNOSIS — I472 Ventricular tachycardia, unspecified: Secondary | ICD-10-CM | POA: Diagnosis not present

## 2023-07-24 DIAGNOSIS — E1122 Type 2 diabetes mellitus with diabetic chronic kidney disease: Secondary | ICD-10-CM | POA: Diagnosis not present

## 2023-07-24 DIAGNOSIS — I130 Hypertensive heart and chronic kidney disease with heart failure and stage 1 through stage 4 chronic kidney disease, or unspecified chronic kidney disease: Secondary | ICD-10-CM | POA: Diagnosis not present

## 2023-07-24 DIAGNOSIS — Z7984 Long term (current) use of oral hypoglycemic drugs: Secondary | ICD-10-CM | POA: Diagnosis not present

## 2023-07-24 DIAGNOSIS — Z7901 Long term (current) use of anticoagulants: Secondary | ICD-10-CM

## 2023-07-24 HISTORY — PX: ATRIAL FIBRILLATION ABLATION: EP1191

## 2023-07-24 LAB — GLUCOSE, CAPILLARY
Glucose-Capillary: 124 mg/dL — ABNORMAL HIGH (ref 70–99)
Glucose-Capillary: 136 mg/dL — ABNORMAL HIGH (ref 70–99)
Glucose-Capillary: 137 mg/dL — ABNORMAL HIGH (ref 70–99)

## 2023-07-24 LAB — PROTIME-INR
INR: 3.1 — ABNORMAL HIGH (ref 0.8–1.2)
Prothrombin Time: 32.4 s — ABNORMAL HIGH (ref 11.4–15.2)

## 2023-07-24 LAB — POCT ACTIVATED CLOTTING TIME: Activated Clotting Time: 400 s

## 2023-07-24 SURGERY — ATRIAL FIBRILLATION ABLATION
Anesthesia: General

## 2023-07-24 MED ORDER — PHENYLEPHRINE 80 MCG/ML (10ML) SYRINGE FOR IV PUSH (FOR BLOOD PRESSURE SUPPORT)
PREFILLED_SYRINGE | INTRAVENOUS | Status: DC | PRN
  Administered 2023-07-24: 80 ug via INTRAVENOUS

## 2023-07-24 MED ORDER — SODIUM CHLORIDE 0.9% FLUSH: 3.0000 mL | Freq: Two times a day (BID) | INTRAVENOUS | Status: DC

## 2023-07-24 MED ORDER — ATROPINE SULFATE 1 MG/10ML IJ SOSY
PREFILLED_SYRINGE | INTRAMUSCULAR | Status: AC
Start: 2023-07-24 — End: ?
  Filled 2023-07-24: qty 10

## 2023-07-24 MED ORDER — ONDANSETRON HCL 4 MG/2ML IJ SOLN: 4.0000 mg | Freq: Four times a day (QID) | INTRAMUSCULAR | Status: DC | PRN

## 2023-07-24 MED ORDER — ALBUTEROL SULFATE HFA 108 (90 BASE) MCG/ACT IN AERS
INHALATION_SPRAY | RESPIRATORY_TRACT | Status: DC | PRN
Start: 2023-07-24 — End: 2023-07-24
  Administered 2023-07-24 (×2): 8 via RESPIRATORY_TRACT

## 2023-07-24 MED ORDER — PHENYLEPHRINE HCL-NACL 20-0.9 MG/250ML-% IV SOLN
INTRAVENOUS | Status: DC | PRN
  Administered 2023-07-24: 40 ug/min via INTRAVENOUS

## 2023-07-24 MED ORDER — HEPARIN (PORCINE) IN NACL 1000-0.9 UT/500ML-% IV SOLN
INTRAVENOUS | Status: DC | PRN
Start: 2023-07-24 — End: 2023-07-24
  Administered 2023-07-24 (×3): 500 mL

## 2023-07-24 MED ORDER — ATROPINE SULFATE 1 MG/ML IV SOLN
INTRAVENOUS | Status: DC | PRN
  Administered 2023-07-24: 1 mg via INTRAVENOUS

## 2023-07-24 MED ORDER — HEPARIN SODIUM (PORCINE) 1000 UNIT/ML IJ SOLN
INTRAMUSCULAR | Status: AC
Start: 2023-07-24 — End: ?
  Filled 2023-07-24: qty 10

## 2023-07-24 MED ORDER — FENTANYL CITRATE (PF) 250 MCG/5ML IJ SOLN
INTRAMUSCULAR | Status: DC | PRN
  Administered 2023-07-24 (×2): 50 ug via INTRAVENOUS

## 2023-07-24 MED ORDER — SUGAMMADEX SODIUM 200 MG/2ML IV SOLN
INTRAVENOUS | Status: DC | PRN
  Administered 2023-07-24: 200 mg via INTRAVENOUS

## 2023-07-24 MED ORDER — DEXAMETHASONE SODIUM PHOSPHATE 10 MG/ML IJ SOLN
INTRAMUSCULAR | Status: DC | PRN
  Administered 2023-07-24: 10 mg via INTRAVENOUS

## 2023-07-24 MED ORDER — PROTAMINE SULFATE 10 MG/ML IV SOLN
INTRAVENOUS | Status: DC | PRN
  Administered 2023-07-24: 40 mg via INTRAVENOUS

## 2023-07-24 MED ORDER — HEPARIN SODIUM (PORCINE) 1000 UNIT/ML IJ SOLN
INTRAMUSCULAR | Status: DC | PRN
  Administered 2023-07-24: 15000 [IU] via INTRAVENOUS

## 2023-07-24 MED ORDER — ROCURONIUM BROMIDE 10 MG/ML (PF) SYRINGE
PREFILLED_SYRINGE | INTRAVENOUS | Status: DC | PRN
  Administered 2023-07-24: 60 mg via INTRAVENOUS

## 2023-07-24 MED ORDER — CEFAZOLIN SODIUM-DEXTROSE 2-3 GM-%(50ML) IV SOLR
INTRAVENOUS | Status: DC | PRN
  Administered 2023-07-24: 2 g via INTRAVENOUS

## 2023-07-24 MED ORDER — EPHEDRINE SULFATE-NACL 50-0.9 MG/10ML-% IV SOSY
PREFILLED_SYRINGE | INTRAVENOUS | Status: DC | PRN
  Administered 2023-07-24: 5 mg via INTRAVENOUS
  Administered 2023-07-24: 10 mg via INTRAVENOUS

## 2023-07-24 MED ORDER — SODIUM CHLORIDE 0.9% FLUSH: 3.0000 mL | INTRAVENOUS | Status: DC | PRN

## 2023-07-24 MED ORDER — SODIUM CHLORIDE 0.9 % IV SOLN: 250.0000 mL | INTRAVENOUS | Status: DC | PRN

## 2023-07-24 MED ORDER — PROPOFOL 10 MG/ML IV BOLUS
INTRAVENOUS | Status: DC | PRN
  Administered 2023-07-24: 150 mg via INTRAVENOUS

## 2023-07-24 MED ORDER — ONDANSETRON HCL 4 MG/2ML IJ SOLN
INTRAMUSCULAR | Status: DC | PRN
Start: 2023-07-24 — End: 2023-07-24
  Administered 2023-07-24: 4 mg via INTRAVENOUS

## 2023-07-24 MED ORDER — CEFAZOLIN SODIUM-DEXTROSE 2-4 GM/100ML-% IV SOLN
INTRAVENOUS | Status: DC
Start: 2023-07-24 — End: 2023-07-24
  Filled 2023-07-24: qty 100

## 2023-07-24 MED ORDER — FENTANYL CITRATE (PF) 100 MCG/2ML IJ SOLN
INTRAMUSCULAR | Status: AC
  Filled 2023-07-24: qty 2

## 2023-07-24 MED ORDER — SODIUM CHLORIDE 0.9 % IV SOLN: INTRAVENOUS | Status: DC

## 2023-07-24 MED ORDER — LIDOCAINE 2% (20 MG/ML) 5 ML SYRINGE
INTRAMUSCULAR | Status: DC | PRN
  Administered 2023-07-24: 100 mg via INTRAVENOUS

## 2023-07-24 MED ORDER — ACETAMINOPHEN 325 MG PO TABS: 650.0000 mg | ORAL_TABLET | ORAL | Status: DC | PRN

## 2023-07-24 SURGICAL SUPPLY — 21 items
BAG SNAP BAND KOVER 36X36 (MISCELLANEOUS) IMPLANT
CABLE PFA RX CATH CONN (CABLE) IMPLANT
CATH EZ STEER NAV 8MM D-F CUR (ABLATOR) IMPLANT
CATH FARAWAVE ABLATION 31 (CATHETERS) IMPLANT
CATH GE 8FR SOUNDSTAR (CATHETERS) IMPLANT
CATH OCTARAY 2.0 F 3-3-3-3-3 (CATHETERS) IMPLANT
CATH WEBSTER BI DIR CS D-F CRV (CATHETERS) IMPLANT
CLOSURE MYNX CONTROL 6F/7F (Vascular Products) IMPLANT
CLOSURE PERCLOSE PROSTYLE (VASCULAR PRODUCTS) IMPLANT
COVER SWIFTLINK CONNECTOR (BAG) ×1 IMPLANT
DILATOR VESSEL 38 20CM 16FR (INTRODUCER) IMPLANT
GUIDEWIRE INQWIRE 1.5J.035X260 (WIRE) IMPLANT
KIT VERSACROSS CNCT FARADRIVE (KITS) IMPLANT
MAT PREVALON FULL STRYKER (MISCELLANEOUS) IMPLANT
PACK EP LF (CUSTOM PROCEDURE TRAY) ×1 IMPLANT
PAD DEFIB RADIO PHYSIO CONN (PAD) ×1 IMPLANT
PATCH CARTO3 (PAD) IMPLANT
SHEATH FARADRIVE STEERABLE (SHEATH) IMPLANT
SHEATH PINNACLE 8F 10CM (SHEATH) IMPLANT
SHEATH PINNACLE 9F 10CM (SHEATH) IMPLANT
SHEATH PROBE COVER 6X72 (BAG) IMPLANT

## 2023-07-24 NOTE — Anesthesia Postprocedure Evaluation (Signed)
 Anesthesia Post Note  Patient: Kimberly Hobbs  Procedure(s) Performed: ATRIAL FIBRILLATION ABLATION     Patient location during evaluation: PACU Anesthesia Type: General Level of consciousness: awake and alert Pain management: pain level controlled Vital Signs Assessment: post-procedure vital signs reviewed and stable Respiratory status: spontaneous breathing, nonlabored ventilation, respiratory function stable and patient connected to nasal cannula oxygen Cardiovascular status: blood pressure returned to baseline and stable Postop Assessment: no apparent nausea or vomiting Anesthetic complications: no   There were no known notable events for this encounter.  Last Vitals:  Vitals:   07/24/23 1330 07/24/23 1333  BP: (!) 96/49 (!) 104/57  Pulse: 60 60  Resp: 13 20  Temp:    SpO2: 94% 95%    Last Pain:  Vitals:   07/24/23 1215  TempSrc:   PainSc: 0-No pain                 Lethaniel Rave

## 2023-07-24 NOTE — H&P (Signed)
  Electrophysiology Office Note:   Date:  07/24/2023  ID:  Kimberly Hobbs, DOB 1960/12/23, MRN [id number]  Primary Cardiologist: Kimberly Morin, DO Primary Heart Failure: None Electrophysiologist: Kimberly Hobbs Kimberly Ding, MD      History of Present Illness:   Kimberly Hobbs is a 63 y.o. female with h/o persistent atrial fibrillation, coronary artery disease, valvular heart disease post AVR/MVR, diabetes, ventricular tachycardia seen today for routine electrophysiology followup.   Today, denies symptoms of palpitations, chest pain, dyspnea, orthopnea, PND, lower extremity edema, claudication, dizziness, presyncope, syncope, bleeding, or neurologic sequela. The patient is tolerating medications without difficulties. Plan ablation today.    EP Information / Studies Reviewed:    EKG is not ordered today. EKG from 05/14/2023 reviewed which showed atrial fibrillation      ICD Interrogation-  reviewed in detail today,  See PACEART report.  Device History: Medtronic Dual Chamber ICD implanted 02/10/2023 for ventricular tachycardia History of appropriate therapy: No History of AAD therapy: Yes; currently on amiodarone    Risk Assessment/Calculations:    CHA2DS2-VASc Score = 5   This indicates a 7.2% annual risk of stroke. The patient's score is based upon: CHF History: 1 HTN History: 1 Diabetes History: 1 Stroke History: 0 Vascular Disease History: 1 Age Score: 0 Gender Score: 1           Physical Exam:   VS:  BP (!) 163/77   Pulse 67   Temp 99.1 F (37.3 C)   Resp 20   Ht 5\' 5"  (1.651 m)   Wt 93.9 kg   LMP 06/20/2015   SpO2 97%   BMI 34.45 kg/m    Wt Readings from Last 3 Encounters:  07/24/23 93.9 kg  07/15/23 93 kg  07/11/23 94.1 kg    GEN: No acute distress.   Neck: No JVD Cardiac: RRR, no murmurs, rubs, or gallops.  Respiratory: normal BS bilaterally. GI: Soft, nontender, non-distended  MS: No edema; No deformity. Neuro:  Nonfocal  Skin: warm and dry Psych:  Normal affect    ASSESSMENT AND PLAN:    Ventricular tachycardia s/p Medtronic dual chamber ICD  euvolemic today Stable on an appropriate medical regimen Normal ICD function Sensing, threshold, impedance within normal limits Programming reviewed and stable for patient See Kimberly Hobbs Art report No changes today  2.  Persistent atrial fibrillation/flutter: Kimberly Hobbs has presented today for surgery, with the diagnosis of AF.  The various methods of treatment have been discussed with the patient and family. After consideration of risks, benefits and other options for treatment, the patient has consented to  Procedure(s): Catheter ablation as a surgical intervention .  Risks include but not limited to complete heart block, stroke, esophageal damage, nerve damage, bleeding, vascular damage, tamponade, perforation, MI, and death. The patient's history has been reviewed, patient examined, no change in status, stable for surgery.  I have reviewed the patient's chart and labs.  Questions were answered to the patient's satisfaction.    Kimberly Heyboer [name], MD 07/24/2023 9:20 AM

## 2023-07-24 NOTE — Anesthesia Preprocedure Evaluation (Signed)
 Anesthesia Evaluation  Patient identified by MRN, date of birth, ID band Patient awake    Reviewed: Allergy  & Precautions, NPO status , Patient's Chart, lab work & pertinent test results, reviewed documented beta blocker date and time   History of Anesthesia Complications Negative for: history of anesthetic complications  Airway Mallampati: II  TM Distance: >3 FB     Dental  (+) Upper Dentures   Pulmonary sleep apnea , COPD,  COPD inhaler, former smoker   breath sounds clear to auscultation       Cardiovascular hypertension, Pt. on medications and Pt. on home beta blockers + angina with exertion + CAD, + Cardiac Stents, + CABG and +CHF  + dysrhythmias Atrial Fibrillation and Ventricular Tachycardia + Cardiac Defibrillator + Valvular Problems/Murmurs  Rhythm:Irregular Rate:Tachycardia  Mechanical AVR, mechanical MVR both with normal function. EF 55-60%.  IMPRESSIONS     1. Left ventricular ejection fraction, by estimation, is 50 to 55%. The  left ventricle has low normal function. The left ventricle has no regional  wall motion abnormalities.   2. Right ventricular systolic function is normal. The right ventricular  size is normal. There is normal pulmonary artery systolic pressure. The  estimated right ventricular systolic pressure is 29.8 mmHg.   3. Left atrial size was mildly dilated. No left atrial/left atrial  appendage thrombus was detected.   4. The mitral valve has been repaired/replaced. No evidence of mitral  valve regurgitation. No evidence of mitral stenosis. There is a 25 mm  Regent mechanical present in the mitral position. Procedure Date: 2018.  Echo findings are consistent with normal  structure and function of the mitral valve prosthesis.   5. Tricuspid valve regurgitation is moderate to severe.   6. The aortic valve has been repaired/replaced. Aortic valve  regurgitation is not visualized. No aortic stenosis  is present. There is a  19 mm St. Jude bileaflet valve present in the aortic position. Procedure  Date: 2018. Echo findings are consistent  with normal structure and function of the aortic valve prosthesis.   7. The inferior vena cava is normal in size with greater than 50%  respiratory variability, suggesting right atrial pressure of 3 mmHg.     Neuro/Psych neg Seizures PSYCHIATRIC DISORDERS Anxiety      Neuromuscular disease    GI/Hepatic Neg liver ROS,GERD  Medicated,,  Endo/Other  diabetes, Well Controlled, Type 2, Oral Hypoglycemic Agents  Obesity HLD  Renal/GU Renal InsufficiencyRenal disease        negative genitourinary   Musculoskeletal  (+) Arthritis ,    Abdominal   Peds  Hematology  (+) Blood dyscrasia, anemia            Anesthesia Other Findings   Reproductive/Obstetrics                              Anesthesia Physical Anesthesia Plan  ASA: 3  Anesthesia Plan: General   Post-op Pain Management: Minimal or no pain anticipated   Induction: Intravenous  PONV Risk Score and Plan: 3 and Ondansetron , Dexamethasone  and Treatment may vary due to age or medical condition  Airway Management Planned: Oral ETT  Additional Equipment: None  Intra-op Plan:   Post-operative Plan: Extubation in OR  Informed Consent: I have reviewed the patients History and Physical, chart, labs and discussed the procedure including the risks, benefits and alternatives for the proposed anesthesia with the patient or authorized representative who has indicated his/her understanding  and acceptance.     Dental advisory given  Plan Discussed with: CRNA and Anesthesiologist  Anesthesia Plan Comments:         Anesthesia Quick Evaluation

## 2023-07-24 NOTE — Transfer of Care (Signed)
 Immediate Anesthesia Transfer of Care Note  Patient: Kimberly Hobbs  Procedure(s) Performed: ATRIAL FIBRILLATION ABLATION  Patient Location: PACU  Anesthesia Type:General  Level of Consciousness: awake and alert   Airway & Oxygen Therapy: Patient Spontanous Breathing and Patient connected to nasal cannula oxygen  Post-op Assessment: Report given to RN and Post -op Vital signs reviewed and stable  Post vital signs: Reviewed and stable  Last Vitals:  Vitals Value Taken Time  BP 116/47   Temp 98   Pulse 60 07/24/23 1145  Resp 15 07/24/23 1145  SpO2 93 % 07/24/23 1145  Vitals shown include unfiled device data.  Last Pain:  Vitals:   07/24/23 1145  TempSrc: (P) Oral  PainSc:       Patients Stated Pain Goal: 3 (07/24/23 0820)  Complications: There were no known notable events for this encounter.

## 2023-07-24 NOTE — Anesthesia Procedure Notes (Signed)
 Procedure Name: Intubation Date/Time: 07/24/2023 10:08 AM  Performed by: Gabe Jock, CRNAPre-anesthesia Checklist: Patient identified, Emergency Drugs available, Suction available and Patient being monitored Patient Re-evaluated:Patient Re-evaluated prior to induction Oxygen Delivery Method: Circle System Utilized Preoxygenation: Pre-oxygenation with 100% oxygen Induction Type: IV induction Ventilation: Mask ventilation without difficulty Laryngoscope Size: Mac and 4 Grade View: Grade I Tube type: Oral Tube size: 7.0 mm Number of attempts: 1 Airway Equipment and Method: Stylet Placement Confirmation: ETT inserted through vocal cords under direct vision, positive ETCO2 and breath sounds checked- equal and bilateral Secured at: 22 cm Tube secured with: Tape Dental Injury: Teeth and Oropharynx as per pre-operative assessment

## 2023-07-24 NOTE — Discharge Instructions (Signed)

## 2023-07-25 ENCOUNTER — Ambulatory Visit: Payer: Self-pay | Admitting: Cardiology

## 2023-07-25 ENCOUNTER — Telehealth (HOSPITAL_COMMUNITY): Payer: Self-pay

## 2023-07-25 MED FILL — Atropine Sulfate Soln Prefill Syr 1 MG/10ML (0.1 MG/ML): INTRAMUSCULAR | Qty: 10 | Status: AC

## 2023-07-25 MED FILL — Fentanyl Citrate Preservative Free (PF) Inj 100 MCG/2ML: INTRAMUSCULAR | Qty: 2 | Status: AC

## 2023-07-25 NOTE — Telephone Encounter (Signed)
 Spoke with patient to complete post procedure follow up call.  Patient reports no complications with groin sites.   Instructions reviewed with patient:  Remove large bandage at puncture site after 24 hours. It is normal to have bruising, tenderness, mild swelling, and a pea or marble sized lump/knot at the groin site which can take up to three months to resolve.  Get help right away if you notice sudden swelling at the puncture site.  Check your puncture site every day for signs of infection: fever, redness, swelling, pus drainage, warmth, foul odor or excessive pain. If this occurs, please call the office at [phone], to speak with the nurse. Get help right away if your puncture site is bleeding and the bleeding does not stop after applying firm pressure to the area.  You may continue to have skipped beats/ atrial fibrillation during the first several months after your procedure.  It is very important not to miss any doses of your blood thinner Warfarin.    You will follow up with the Afib clinic on 08/21/23 and follow up with the Afib clinic on 10/24/23.   Patient verbalized understanding to all instructions provided.

## 2023-07-28 ENCOUNTER — Encounter: Payer: Self-pay | Admitting: Physical Medicine & Rehabilitation

## 2023-07-29 ENCOUNTER — Ambulatory Visit: Attending: Cardiology

## 2023-07-29 DIAGNOSIS — Z5181 Encounter for therapeutic drug level monitoring: Secondary | ICD-10-CM

## 2023-07-29 DIAGNOSIS — I480 Paroxysmal atrial fibrillation: Secondary | ICD-10-CM

## 2023-07-29 LAB — POCT INR: INR: 2 (ref 2.0–3.0)

## 2023-07-29 NOTE — Patient Instructions (Signed)
 Description   Take 7.5mg  today and 7.5mg  tomorrow and then resume taking warfarin 5mg  daily.   Recheck INR in 1 week.  Anticoagulation Clinic [phone]  *Amiodarone  400mg  BID*

## 2023-07-31 ENCOUNTER — Ambulatory Visit: Admitting: Psychiatry

## 2023-07-31 ENCOUNTER — Ambulatory Visit (INDEPENDENT_AMBULATORY_CARE_PROVIDER_SITE_OTHER): Admitting: Licensed Clinical Social Worker

## 2023-07-31 DIAGNOSIS — F331 Major depressive disorder, recurrent, moderate: Secondary | ICD-10-CM

## 2023-07-31 DIAGNOSIS — F411 Generalized anxiety disorder: Secondary | ICD-10-CM

## 2023-07-31 DIAGNOSIS — F4310 Post-traumatic stress disorder, unspecified: Secondary | ICD-10-CM
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
## 2023-08-01 DIAGNOSIS — Z7901 Long term (current) use of anticoagulants: Secondary | ICD-10-CM | POA: Diagnosis not present

## 2023-08-01 DIAGNOSIS — I480 Paroxysmal atrial fibrillation: Secondary | ICD-10-CM | POA: Diagnosis not present

## 2023-08-01 DIAGNOSIS — Z952 Presence of prosthetic heart valve: Secondary | ICD-10-CM | POA: Diagnosis not present

## 2023-08-01 MED ORDER — AMIODARONE HCL 200 MG PO TABS
400.0000 mg | ORAL_TABLET | Freq: Two times a day (BID) | ORAL | 3 refills | Status: DC
Start: 2023-08-01 — End: 2023-08-13

## 2023-08-05 ENCOUNTER — Other Ambulatory Visit: Payer: Self-pay | Admitting: Physical Medicine & Rehabilitation

## 2023-08-05 ENCOUNTER — Ambulatory Visit: Attending: Cardiology

## 2023-08-05 DIAGNOSIS — Z5181 Encounter for therapeutic drug level monitoring: Secondary | ICD-10-CM | POA: Diagnosis not present

## 2023-08-05 DIAGNOSIS — I480 Paroxysmal atrial fibrillation: Secondary | ICD-10-CM

## 2023-08-05 LAB — POCT INR: INR: 4.2 — AB (ref 2.0–3.0)

## 2023-08-05 MED ORDER — BUPRENORPHINE 7.5 MCG/HR TD PTWK: 1.0000 | MEDICATED_PATCH | TRANSDERMAL | 0 refills | Status: DC

## 2023-08-05 NOTE — Patient Instructions (Signed)
 Description   HOLD today's dose and then resume taking warfarin 5mg  daily.   Recheck INR in 1 week.  Anticoagulation Clinic [phone]  *Amiodarone  400mg  BID*

## 2023-08-06 ENCOUNTER — Telehealth: Payer: Self-pay | Admitting: Cardiology

## 2023-08-06 ENCOUNTER — Telehealth: Payer: Self-pay

## 2023-08-06 ENCOUNTER — Other Ambulatory Visit: Payer: Self-pay | Admitting: Psychiatry

## 2023-08-06 DIAGNOSIS — G253 Myoclonus: Secondary | ICD-10-CM | POA: Diagnosis not present

## 2023-08-06 DIAGNOSIS — G4733 Obstructive sleep apnea (adult) (pediatric): Secondary | ICD-10-CM | POA: Diagnosis not present

## 2023-08-06 DIAGNOSIS — R7989 Other specified abnormal findings of blood chemistry: Secondary | ICD-10-CM | POA: Diagnosis not present

## 2023-08-06 DIAGNOSIS — R233 Spontaneous ecchymoses: Secondary | ICD-10-CM | POA: Diagnosis not present

## 2023-08-06 MED ORDER — CLONAZEPAM 0.5 MG PO TABS: 0.5000 mg | ORAL_TABLET | Freq: Every day | ORAL | 0 refills | Status: DC | PRN

## 2023-08-06 NOTE — Telephone Encounter (Signed)
Medication is ordered

## 2023-08-06 NOTE — Telephone Encounter (Signed)
 Spoke with Jullie Oiler, [place] from [facility] and she had wanted to discuss with Dr. Emmette Harms about possibly decreasing Amiodarone  dose d/t pt experiencing myoclonus and the symptoms getting worse. Will forward to Dr. Emmette Harms and her nurse. PA provided her personal number for our office to contact.  [place], [place] ~ [phone]

## 2023-08-06 NOTE — Telephone Encounter (Signed)
 pt sent a mychart message that she needed a refill on the clonazepam . next appt 6-30 last appt 4-7

## 2023-08-06 NOTE — Telephone Encounter (Signed)
 New Message:      Jullie Oiler, [place] wants to give an update on patient.

## 2023-08-12 ENCOUNTER — Ambulatory Visit (INDEPENDENT_AMBULATORY_CARE_PROVIDER_SITE_OTHER): Payer: 59

## 2023-08-12 ENCOUNTER — Ambulatory Visit: Attending: Cardiology

## 2023-08-12 DIAGNOSIS — I480 Paroxysmal atrial fibrillation: Secondary | ICD-10-CM | POA: Diagnosis not present

## 2023-08-12 DIAGNOSIS — Z5181 Encounter for therapeutic drug level monitoring: Secondary | ICD-10-CM

## 2023-08-12 LAB — CUP PACEART REMOTE DEVICE CHECK
Battery Remaining Longevity: 135 mo
Battery Voltage: 3.06 V
Brady Statistic AP VP Percent: 0.05 %
Brady Statistic AP VS Percent: 99.59 %
Brady Statistic AS VP Percent: 0.01 %
Brady Statistic AS VS Percent: 0.36 %
Brady Statistic RA Percent Paced: 99.62 %
Brady Statistic RV Percent Paced: 0.06 %
Date Time Interrogation Session: 20250623192718
HighPow Impedance: 65 Ohm
Implantable Lead Connection Status: 753985
Implantable Lead Connection Status: 753985
Implantable Lead Implant Date: 20241223
Implantable Lead Implant Date: 20241223
Implantable Lead Location: 753859
Implantable Lead Location: 753860
Implantable Lead Model: 5076
Implantable Lead Model: 5076
Implantable Pulse Generator Implant Date: 20241223
Lead Channel Impedance Value: 304 Ohm
Lead Channel Impedance Value: 342 Ohm
Lead Channel Impedance Value: 380 Ohm
Lead Channel Pacing Threshold Amplitude: 0.75 V
Lead Channel Pacing Threshold Amplitude: 0.875 V
Lead Channel Pacing Threshold Pulse Width: 0.4 ms
Lead Channel Pacing Threshold Pulse Width: 0.4 ms
Lead Channel Sensing Intrinsic Amplitude: 0.8 mV
Lead Channel Sensing Intrinsic Amplitude: 22.1 mV
Lead Channel Setting Pacing Amplitude: 1.5 V
Lead Channel Setting Pacing Amplitude: 2 V
Lead Channel Setting Pacing Pulse Width: 0.4 ms
Lead Channel Setting Sensing Sensitivity: 0.3 mV
Zone Setting Status: 755011
Zone Setting Status: 755011

## 2023-08-12 LAB — POCT INR: INR: 2.5 (ref 2.0–3.0)

## 2023-08-12 NOTE — Progress Notes (Unsigned)
 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  - Gabapentin  is a common cause of this, but she is on an extremely low dose and I am not sure we can blame it on on the gabapentin .  She does report it is not really helping and may be going off of it.  - Butrans  patch is brand-new for her and she just started on May 27.  She reports that symptoms have only been going on about a month.  I really have not had myoclonus much with the opioids, but cannot deny the timing, and she should discuss this with her pain management physician.  - We discussed that renal insufficiency is certainly a common cause of myoclonus.  She has had this for a while.  She asked me about amiodarone  being the cause.  Amiodarone  can certainly contribute to renal insufficiency, but it alone does not usually cause myoclonus (commonly causes tremor).  Amiodarone  certainly could be worsening the renal insufficiency, however, and making myoclonus therefore worse.  She does tell me that she just decreased the amiodarone  from quite a high dose, 800 mg, to 200 mg last night.  She knows that it has an extremely long half-life and the medication side effects can last a long time.  - At this point, I would like to take a wait-and-see approach and she completely agrees.  I did not see anything focal or lateralizing on her examination today.  I will see her back on an as-needed basis. Subjective:   Kimberly Hobbs was seen today in neurologic consultation at the request of Ziefel, Kimberly A, PA-C.  The consultation is for the evaluation of muscle jerking/myoclonus.  The symptoms started about 1 month ago.  It initially started at night, but is now present at the daytime.   The only med change was an increase in hydroxyzine  and butrans  patch was started 5-6 weeks ago.   She is on gabapentin , but very low-dose, 200 mg at bedtime.  She has been on that about 6 months.  She is also on  amiodarone .  I did notice that her primary care had reached out to cardiology to see if the dosage of that medication could be adjusted.  She was on 800 mg daily and last night they reduced it to 200 mg daily.  She just had a cardiac ablation and she feels much better. Patient had is also on Singulair , albuterol .  She almost never uses the albuterol .  She reports her kidney function isn't too great.  She states that jerking can happen either arm or either leg but its independent of one another.  It is not a regular tremor.  It doesn't happen when standing so no falling.     ALLERGIES:   Allergies  Allergen Reactions   Duloxetine  Hcl Other (See Comments)    Behavioral changes, sleep disturbances   Iodinated Contrast Media Hives   Penicillins Other (See Comments)    Immune to drug , does not work per patient    Doxycycline  Nausea And Vomiting    CURRENT MEDICATIONS:  Outpatient Encounter Medications as of 08/14/2023  Medication Sig   Accu-Chek Softclix Lancets lancets Use as instructed (Patient taking differently: 1 each by Other route See admin instructions. Use as instructed)   acetaminophen  (TYLENOL ) 500 MG tablet Take 1,000 mg by mouth every 6 (six) hours as needed for moderate pain (pain score 4-6).   albuterol  (ACCUNEB ) 0.63 MG/3ML nebulizer solution Take  1 ampule by nebulization every 4 (four) hours as needed for wheezing or shortness of breath.   amiodarone  (PACERONE ) 200 MG tablet Take 1 tablet (200 mg total) by mouth daily.   aspirin  EC 81 MG tablet Take 81 mg by mouth daily.    azithromycin (ZITHROMAX) 500 MG tablet Take 500 mg by mouth daily.   Blood Glucose Monitoring Suppl (ONE TOUCH ULTRA 2) w/Device KIT 3 (three) times daily.   Budeson-Glycopyrrol-Formoterol  (BREZTRI  AEROSPHERE) 160-9-4.8 MCG/ACT AERO Inhale 2 puffs into the lungs in the morning and at bedtime.   buprenorphine  (BUTRANS ) 7.5 MCG/HR Place 1 patch onto the skin once a week.   clonazePAM  (KLONOPIN ) 0.5 MG  tablet Take 1 tablet (0.5 mg total) by mouth daily as needed for anxiety.   furosemide  (LASIX ) 40 MG tablet Take 1 tablet (40 mg total) by mouth 2 (two) times daily.   gabapentin  (NEURONTIN ) 100 MG capsule Take 1 capsule (100 mg total) by mouth at bedtime.   Glucose Blood (BLOOD GLUCOSE TEST STRIPS) STRP 1 each by In Vitro route in the morning, at noon, and at bedtime. May substitute to any manufacturer covered by patient's insurance.   hydrOXYzine  (ATARAX ) 50 MG tablet Take 1 tablet (50 mg total) by mouth daily as needed for anxiety.   isosorbide  mononitrate (IMDUR ) 60 MG 24 hr tablet Take 1 tablet (60 mg total) by mouth daily.   magnesium  oxide (MAG-OX) 400 (240 Mg) MG tablet Take 1 tablet (400 mg total) by mouth daily.   metFORMIN  (GLUCOPHAGE ) 500 MG tablet Take 1 tablet (500 mg total) by mouth 2 (two) times daily with a meal.   metoprolol  succinate (TOPROL -XL) 100 MG 24 hr tablet Take 1 tablet (100 mg total) by mouth 2 (two) times daily.   montelukast  (SINGULAIR ) 10 MG tablet Take 10 mg by mouth daily.   nitroGLYCERIN  (NITROSTAT ) 0.4 MG SL tablet Place 1 tablet (0.4 mg total) under the tongue every 5 (five) minutes as needed for chest pain. Up to 3 times.   oxyCODONE -acetaminophen  (PERCOCET) 5-325 MG tablet Take 1 tablet by mouth every 12 (twelve) hours as needed for severe pain (pain score 7-10).   pantoprazole  (PROTONIX ) 40 MG tablet Take 1 tablet (40 mg total) by mouth 2 (two) times daily.   potassium chloride  SA (KLOR-CON  M) 20 MEQ tablet Take 1 tablet (20 mEq total) by mouth daily. Take 2 tablets once at dinner 03/26/23 and then 1 tablet daily starting 03/27/23 (Patient taking differently: Take 20 mEq by mouth daily.)   rosuvastatin  (CRESTOR ) 40 MG tablet TAKE 1 TABLET(40 MG) BY MOUTH DAILY   sacubitril -valsartan  (ENTRESTO ) 24-26 MG Take 1 tablet by mouth 2 (two) times daily.   warfarin (COUMADIN ) 5 MG tablet Take Warfarin 5 mg by mouth daily except for  7.5 mg on Mondays or as directed by the  coumadin  clinic. (Patient taking differently: Take 5 mg by mouth daily.)   warfarin (COUMADIN ) 7.5 MG tablet Take 7.5 mg by mouth See admin instructions. Tues, Fri, and Sun   [DISCONTINUED] amiodarone  (PACERONE ) 200 MG tablet Take 2 tablets (400 mg total) by mouth 2 (two) times daily.   [DISCONTINUED] sertraline  (ZOLOFT ) 50 MG tablet Take 1 tablet (50 mg total) by mouth at bedtime. (Patient taking differently: Take 100 mg by mouth at bedtime.)   No facility-administered encounter medications on file as of 08/14/2023.    Objective:   PHYSICAL EXAMINATION:    VITALS:   Vitals:   08/14/23 1355  BP: 122/88  Pulse: 68  SpO2: 97%  Weight: 211 lb 6.4 oz (95.9 kg)  Height: 5' 5 (1.651 m)    GEN:  Normal appears female in no acute distress.  Appears stated age. HEENT:  Normocephalic, atraumatic. The mucous membranes are moist. The superficial temporal arteries are without ropiness or tenderness. Cardiovascular: Regular rate and rhythm. Lungs: Clear to auscultation bilaterally. Neck/Heme: There are no carotid bruits noted bilaterally.  NEUROLOGICAL: Orientation:  The patient is alert and oriented x 3.   Cranial nerves: There is good facial symmetry.  Extraocular muscles are intact and visual fields are full to confrontational testing. Speech is fluent and clear. Soft palate rises symmetrically and there is no tongue deviation. Hearing is intact to conversational tone. Tone: Tone is good throughout. Sensation: Sensation is intact to light touch and pinprick throughout (facial, trunk, extremities). Vibration is intact at the bilateral big toe. There is no extinction with double simultaneous stimulation. There is no sensory dermatomal level identified. Coordination:  The patient has no difficulty with RAM's or FNF bilaterally. Motor: Strength is 5/5 in the bilateral upper and lower extremities.  Shoulder shrug is equal and symmetric. There is no pronator drift.  There are no fasciculations  noted. DTR's: Deep tendon reflexes are 3-/4 at the bilateral biceps, triceps, brachioradialis, patella and 1/4 at the bilateral achilles.  Plantar responses are downgoing bilaterally. Gait and Station: The patient pushes off to arise.  She is wide-based.  She is careful and tenuous, but fairly stable. Abnormal movements: There is no rest or postural tremor.  There is no asterixis.  She has very rare myoclonus noted in the right upper extremity only.    Chemistry      Component Value Date/Time   NA 138 07/01/2023 0956   K 4.9 07/01/2023 0956   CL 98 07/01/2023 0956   CO2 24 07/01/2023 0956   BUN 25 07/01/2023 0956   CREATININE 1.44 (H) 07/01/2023 0956      Component Value Date/Time   CALCIUM  10.0 07/01/2023 0956   ALKPHOS 119 05/14/2023 1027   AST 25 05/14/2023 1027   ALT 48 (H) 05/14/2023 1027   BILITOT 0.4 05/14/2023 1027        Total time spent on today's visit was 45 minutes, including both face-to-face time and nonface-to-face time.  Time included that spent on review of records (prior notes available to me/labs/imaging if pertinent), discussing treatment and goals, answering patient's questions and coordinating care.   Cc:  Revankar, Jennifer SAUNDERS, MD

## 2023-08-12 NOTE — Patient Instructions (Signed)
 Description   Continue taking warfarin 5mg  daily.   Recheck INR in 3 weeks  Anticoagulation Clinic [phone]  *Amiodarone  400mg  BID*

## 2023-08-13 MED ORDER — AMIODARONE HCL 200 MG PO TABS: 200.0000 mg | ORAL_TABLET | Freq: Every day | ORAL | Status: DC

## 2023-08-13 NOTE — Progress Notes (Signed)
Please see anticoagulation encounter.

## 2023-08-13 NOTE — Telephone Encounter (Signed)
 Pt advised to decrease Amiodarone  to 200 mg once daily to see if improvement in symptoms.  Advised pt to contact us  end of next week or the following and let us  know how she is doing on the reduced dose...SABRASABRA symptoms improve?? Pt agreeable to plan and glad I called.  Notified Suzen, [place] (PCP office).  She appreciates the call/update.

## 2023-08-13 NOTE — Addendum Note (Signed)
 Addended by: GRETEL MAEOLA CROME on: 08/13/2023 06:03 PM   Modules accepted: Orders

## 2023-08-14 ENCOUNTER — Encounter: Payer: Self-pay | Admitting: Neurology

## 2023-08-14 ENCOUNTER — Ambulatory Visit (INDEPENDENT_AMBULATORY_CARE_PROVIDER_SITE_OTHER): Admitting: Neurology

## 2023-08-14 VITALS — BP 122/88 | HR 68 | Ht 65.0 in | Wt 211.4 lb

## 2023-08-14 DIAGNOSIS — G253 Myoclonus: Secondary | ICD-10-CM

## 2023-08-14 DIAGNOSIS — T887XXA Unspecified adverse effect of drug or medicament, initial encounter: Secondary | ICD-10-CM

## 2023-08-14 NOTE — Patient Instructions (Signed)
 We discussed that your medications could be contributing.  Gabapentin  is a common cause of myclonus in the setting of renal insufficiency.  However, you are on a really low dose of gabapentin .  It could be the amiodarone  that added to some renal insufficiency as well, and your amiodarone  dose was just decreased.  Lets see what happens with that change and talk to your pain management doc about the gabapentin  since its not helping and its so low dose.    The physicians and staff at [facility] are committed to providing excellent care. You may receive a survey requesting feedback about your experience at our office. We strive to receive very good responses to the survey questions. If you feel that your experience would prevent you from giving the office a very good  response, please contact our office to try to remedy the situation. We may be reached at [phone]. Thank you for taking the time out of your busy day to complete the survey.
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
## 2023-08-17 ENCOUNTER — Ambulatory Visit: Payer: Self-pay | Admitting: Cardiology

## 2023-08-18 ENCOUNTER — Encounter: Payer: Self-pay | Admitting: Psychiatry

## 2023-08-18 ENCOUNTER — Telehealth (INDEPENDENT_AMBULATORY_CARE_PROVIDER_SITE_OTHER): Admitting: Psychiatry

## 2023-08-18 DIAGNOSIS — F4310 Post-traumatic stress disorder, unspecified: Secondary | ICD-10-CM | POA: Diagnosis not present

## 2023-08-18 DIAGNOSIS — F411 Generalized anxiety disorder: Secondary | ICD-10-CM

## 2023-08-18 DIAGNOSIS — F331 Major depressive disorder, recurrent, moderate: Secondary | ICD-10-CM

## 2023-08-18 MED ORDER — PRAZOSIN HCL 1 MG PO CAPS: 1.0000 mg | ORAL_CAPSULE | Freq: Every day | ORAL | 1 refills | Status: DC

## 2023-08-18 MED ORDER — CLONAZEPAM 0.5 MG PO TABS: 0.5000 mg | ORAL_TABLET | Freq: Every day | ORAL | 0 refills | Status: DC | PRN

## 2023-08-21 ENCOUNTER — Ambulatory Visit (HOSPITAL_COMMUNITY): Admitting: Physician Assistant

## 2023-08-21 ENCOUNTER — Encounter: Payer: Self-pay | Admitting: Physical Medicine & Rehabilitation

## 2023-08-21 ENCOUNTER — Encounter (HOSPITAL_COMMUNITY): Payer: Self-pay

## 2023-08-25 ENCOUNTER — Encounter (HOSPITAL_COMMUNITY): Payer: Self-pay | Admitting: Physician Assistant

## 2023-08-25 ENCOUNTER — Ambulatory Visit (HOSPITAL_COMMUNITY)
Admission: RE | Admit: 2023-08-25 | Discharge: 2023-08-25 | Disposition: A | Discharge status: Home / Self Care | Source: Ambulatory Visit | Attending: Physician Assistant | Admitting: Physician Assistant

## 2023-08-25 VITALS — BP 118/70 | HR 75 | Ht 65.0 in | Wt 209.8 lb

## 2023-08-25 DIAGNOSIS — I4819 Other persistent atrial fibrillation: Secondary | ICD-10-CM | POA: Diagnosis not present

## 2023-08-25 DIAGNOSIS — I480 Paroxysmal atrial fibrillation: Secondary | ICD-10-CM | POA: Diagnosis not present

## 2023-08-25 DIAGNOSIS — Z5181 Encounter for therapeutic drug level monitoring: Secondary | ICD-10-CM | POA: Diagnosis not present

## 2023-08-25 DIAGNOSIS — I483 Typical atrial flutter: Secondary | ICD-10-CM

## 2023-08-25 DIAGNOSIS — Z79899 Other long term (current) drug therapy: Secondary | ICD-10-CM

## 2023-08-25 DIAGNOSIS — D6869 Other thrombophilia: Secondary | ICD-10-CM

## 2023-08-25 NOTE — Progress Notes (Signed)
 Primary Care Physician: Edwyna Jennifer SAUNDERS, MD Primary Cardiologist: Kardie Tobb, DO Electrophysiologist: Soyla Gladis Norton, MD  Referring Physician: Dr Norton Cones [name] is a 63 y.o. 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 She was admitted to the hospital March 2024 with acute on chronic diastolic heart failure and rapid atrial fibrillation. She had previously failed amiodarone . She was seen by Dr Norton and underwent afib ablation on 09/05/22. Patient is on warfarin for stroke prevention.   She was hospitalized 01/2023 with VT and is s/p ICD implant. Amiodarone  was resumed. She continued to have issues with recurrent afib despite amiodarone  and is s/p repeat afib and flutter ablation 07/24/23.   Patient returns for follow up for atrial fibrillation and amiodarone  monitoring. She is in SR today and feeling better. She states that she has more energy. Her device shows 0% afib burden. She denies chest pain or groin issues.   Today, she  denies symptoms of palpitations, chest pain, shortness of breath, orthopnea, PND, lower extremity edema, dizziness, presyncope, syncope, bleeding, or neurologic sequela. The patient is tolerating medications without difficulties and is otherwise without complaint today.    Atrial Fibrillation Risk Factors:  she does have symptoms or diagnosis of sleep apnea. she does have a history of rheumatic fever.   Atrial Fibrillation Management history:  Previous antiarrhythmic drugs: amiodarone  Previous cardioversions: 04/02/22, 04/27/22, 03/27/23, 05/06/23, 06/02/23 Previous ablations: 09/05/22, 07/24/23 Anticoagulation history: warfarin   ROS- All systems are reviewed and negative except as per the HPI above.  Past Medical  History:  Diagnosis Date   Allergy  ?   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 01/01/2017 01/01/2017    Current Outpatient Medications  Medication Sig Dispense Refill   Accu-Chek Softclix Lancets lancets Use as instructed 100 each 12   acetaminophen  (TYLENOL ) 500 MG tablet Take 1,000 mg by mouth every 6 (six) hours as needed for moderate pain (pain score 4-6).     albuterol  (ACCUNEB ) 0.63 MG/3ML nebulizer solution Take 1 ampule by nebulization every 4 (four) hours as needed for wheezing or shortness of breath.     amiodarone  (PACERONE ) 200 MG tablet Take 1 tablet (200 mg total) by mouth daily.     aspirin  EC 81 MG tablet Take 81 mg by mouth daily.      azithromycin (ZITHROMAX) 500 MG tablet Take 500 mg by mouth daily.     Blood Glucose Monitoring Suppl (ONE TOUCH ULTRA 2) w/Device KIT 3 (three) times daily.     Budeson-Glycopyrrol-Formoterol  (BREZTRI  AEROSPHERE) 160-9-4.8 MCG/ACT AERO Inhale 2 puffs into the lungs in the morning and at bedtime. 10.7 g 11   [START ON 09/08/2023] clonazePAM  (KLONOPIN ) 0.5 MG  tablet Take 1 tablet (0.5 mg total) by mouth daily as needed for anxiety. 30 tablet 0   furosemide  (LASIX ) 40 MG tablet Take 1 tablet (40 mg total) by mouth 2 (two) times daily. 180 tablet 3   gabapentin  (NEURONTIN ) 100 MG capsule Take 1 capsule (100 mg total) by mouth at bedtime.     Glucose Blood (BLOOD GLUCOSE TEST STRIPS) STRP 1 each by In Vitro route in the morning,  at noon, and at bedtime. May substitute to any manufacturer covered by patient's insurance. 100 strip 11   isosorbide  mononitrate (IMDUR ) 60 MG 24 hr tablet Take 1 tablet (60 mg total) by mouth daily. 90 tablet 3   magnesium  oxide (MAG-OX) 400 (240 Mg) MG tablet Take 1 tablet (400 mg total) by mouth daily. 30 tablet 6   metFORMIN  (GLUCOPHAGE ) 500 MG tablet Take 1 tablet (500 mg total) by mouth 2 (two) times daily with a meal. 180 tablet 3   metoprolol  succinate (TOPROL -XL) 100 MG 24 hr tablet Take 1 tablet (100 mg total) by mouth 2 (two) times daily. 180 tablet 3   montelukast  (SINGULAIR ) 10 MG tablet Take 10 mg by mouth daily.     nitroGLYCERIN  (NITROSTAT ) 0.4 MG SL tablet Place 1 tablet (0.4 mg total) under the tongue every 5 (five) minutes as needed for chest pain. Up to 3 times. 25 tablet 5   oxyCODONE -acetaminophen  (PERCOCET) 5-325 MG tablet Take 1 tablet by mouth every 12 (twelve) hours as needed for severe pain (pain score 7-10). 60 tablet 0   pantoprazole  (PROTONIX ) 40 MG tablet Take 1 tablet (40 mg total) by mouth 2 (two) times daily. 180 tablet 0   potassium chloride  SA (KLOR-CON  M) 20 MEQ tablet Take 1 tablet (20 mEq total) by mouth daily. Take 2 tablets once at dinner 03/26/23 and then 1 tablet daily starting 03/27/23 30 tablet 0   prazosin  (MINIPRESS ) 1 MG capsule Take 1 capsule (1 mg total) by mouth at bedtime. 30 capsule 1   rosuvastatin  (CRESTOR ) 40 MG tablet TAKE 1 TABLET(40 MG) BY MOUTH DAILY 90 tablet 1   sacubitril -valsartan  (ENTRESTO ) 24-26 MG Take 1 tablet by mouth 2 (two) times daily. 180 tablet 3   warfarin (COUMADIN ) 5 MG tablet Take Warfarin 5 mg by mouth daily except for  7.5 mg on Mondays or as directed by the coumadin  clinic. 40 tablet 1   warfarin (COUMADIN ) 7.5 MG tablet Take 7.5 mg by mouth See admin instructions. Tues, Fri, and Sun     No current facility-administered medications for this encounter.    Physical Exam: BP 118/70   Pulse 75   Ht 5' 5 (1.651 m)   Wt  95.2 kg   LMP 06/20/2015   BMI 34.91 kg/m   GEN: Well nourished, well developed in no acute distress NECK: No JVD; No carotid bruits CARDIAC: Regular rate and rhythm, no rubs, gallops, mech click RESPIRATORY:  Clear to auscultation without rales, wheezing or rhonchi  ABDOMEN: Soft, non-tender, non-distended EXTREMITIES:  No edema; No deformity    Wt Readings from Last 3 Encounters:  08/25/23 95.2 kg  08/14/23 95.9 kg  07/24/23 93.9 kg     EKG today demonstrates  A paced V sensed rhythm, IVCD Vent. rate 75 BPM PR interval * ms QRS duration 128 ms QT/QTcB 456/509 ms   Echo 02/03/23 demonstrated   1. Left ventricular ejection fraction, by estimation, is 55 to 60%. The  left ventricle has normal function. The left ventricle has no regional  wall motion abnormalities. Left ventricular diastolic parameters are  indeterminate.   2. Right ventricular systolic function is mildly reduced. The right  ventricular size is normal.   3. Left atrial size was mildly dilated.   4. The mitral valve has been repaired/replaced. No evidence of mitral  valve regurgitation. No evidence of mitral stenosis. The mean mitral valve  gradient is 3.0 mmHg. Echo findings are consistent with normal structure  and function of the mitral valve  prosthesis.   5. The aortic valve has been repaired/replaced. There is moderate  calcification of the aortic valve. Aortic valve regurgitation is not  visualized. Mild aortic valve stenosis. Aortic valve area, by VTI measures  1.34 cm. Aortic valve mean gradient  measures 19.5 mmHg. Aortic valve Vmax measures 3.00 m/s.   6. The inferior vena cava is normal in size with greater than 50%  respiratory variability, suggesting right atrial pressure of 3 mmHg.   Conclusion(s)/Recommendation(s): Gradient through mechanical aortic valve  mildly elevated but no change since echo 2/24.    CHA2DS2-VASc Score = 5  The patient's score is based upon: CHF History: 1 HTN  History: 1 Diabetes History: 1 Stroke History: 0 Vascular Disease History: 1 Age Score: 0 Gender Score: 1       ASSESSMENT AND PLAN: Persistent Atrial Fibrillation/atrial flutter The patient's CHA2DS2-VASc score is 5, indicating a 7.2% annual risk of stroke.   S/p afib ablation 09/05/22 and afib/flutter ablation 07/24/23 Patient maintaining SR, 0% burden on device. Continue amiodarone  200 mg daily Continue Toprol  100 mg BID Continue warfarin  Secondary Hypercoagulable State (ICD10:  D68.69) The patient is at significant risk for stroke/thromboembolism based upon her CHA2DS2-VASc Score of 5.  Continue Warfarin (Coumadin ). No bleeding issues.   High Risk Medication Monitoring (ICD 10: Z79.899) Intervals on ECG acceptable for amiodarone  monitoring.   CAD S/p CABG 2020 No anginal symptoms Followed by Dr Sheena  HTN Stable on current regimen  VHD Rheumatic S/p mitral and aortic valve replacements 2018 Continue warfarin  Obesity Body mass index is 34.91 kg/m.  Encouraged lifestyle modification  Chronic HFpEF GDMT per primary cardiology team Fluid status appears stable  OSA  Encouraged nightly CPAP  VT  S/p ICD, followed by Dr Inocencio     Follow up in the AF clinic as scheduled.     Daril Kicks PA-C [facility] [address] [place], [place] [address] [phone]

## 2023-08-27 ENCOUNTER — Encounter: Payer: Self-pay | Admitting: Cardiology

## 2023-08-30 DIAGNOSIS — I480 Paroxysmal atrial fibrillation: Secondary | ICD-10-CM | POA: Diagnosis not present

## 2023-08-30 DIAGNOSIS — Z7901 Long term (current) use of anticoagulants: Secondary | ICD-10-CM | POA: Diagnosis not present

## 2023-08-30 DIAGNOSIS — Z952 Presence of prosthetic heart valve: Secondary | ICD-10-CM | POA: Diagnosis not present

## 2023-09-01 DIAGNOSIS — G253 Myoclonus: Secondary | ICD-10-CM | POA: Diagnosis not present

## 2023-09-01 DIAGNOSIS — D6869 Other thrombophilia: Secondary | ICD-10-CM | POA: Diagnosis not present

## 2023-09-01 DIAGNOSIS — I4891 Unspecified atrial fibrillation: Secondary | ICD-10-CM | POA: Diagnosis not present

## 2023-09-01 DIAGNOSIS — K625 Hemorrhage of anus and rectum: Secondary | ICD-10-CM | POA: Diagnosis not present

## 2023-09-02 ENCOUNTER — Ambulatory Visit

## 2023-09-08 ENCOUNTER — Other Ambulatory Visit: Payer: Self-pay | Admitting: Psychiatry

## 2023-09-08 NOTE — Telephone Encounter (Signed)
 Please contact the pharmacy to verify the status of the prescription and update the patient accordingly. She should have an order ready to be filled today.

## 2023-09-09 ENCOUNTER — Encounter: Payer: Self-pay | Admitting: Physical Medicine & Rehabilitation

## 2023-09-09 ENCOUNTER — Ambulatory Visit: Attending: Cardiology

## 2023-09-09 ENCOUNTER — Encounter: Attending: Physical Medicine & Rehabilitation | Admitting: Physical Medicine & Rehabilitation

## 2023-09-09 VITALS — BP 108/71 | HR 76 | Ht 65.0 in | Wt 207.8 lb

## 2023-09-09 DIAGNOSIS — G2581 Restless legs syndrome: Secondary | ICD-10-CM | POA: Insufficient documentation

## 2023-09-09 DIAGNOSIS — I480 Paroxysmal atrial fibrillation: Secondary | ICD-10-CM

## 2023-09-09 DIAGNOSIS — Z79891 Long term (current) use of opiate analgesic: Secondary | ICD-10-CM | POA: Insufficient documentation

## 2023-09-09 DIAGNOSIS — G8929 Other chronic pain: Secondary | ICD-10-CM | POA: Insufficient documentation

## 2023-09-09 DIAGNOSIS — M545 Low back pain, unspecified: Secondary | ICD-10-CM | POA: Insufficient documentation

## 2023-09-09 DIAGNOSIS — G894 Chronic pain syndrome: Secondary | ICD-10-CM | POA: Diagnosis not present

## 2023-09-09 DIAGNOSIS — Z5181 Encounter for therapeutic drug level monitoring: Secondary | ICD-10-CM | POA: Diagnosis not present

## 2023-09-09 LAB — POCT INR: INR: 3.6 — AB (ref 2.0–3.0)

## 2023-09-09 MED ORDER — OXYCODONE-ACETAMINOPHEN 7.5-325 MG PO TABS: 1.0000 | ORAL_TABLET | Freq: Two times a day (BID) | ORAL | 0 refills | Status: DC | PRN

## 2023-09-09 NOTE — Progress Notes (Signed)
 INR 3.6  Please see anticoagulation encounter

## 2023-09-09 NOTE — Patient Instructions (Addendum)
 Description   Eat greens tonight and continue taking warfarin 5mg  daily.   Recheck INR in 4 weeks  Anticoagulation Clinic [phone]  [facility] Clearance request Fax #[phone] *Amiodarone  400mg  BID*

## 2023-09-09 NOTE — Progress Notes (Signed)
 Subjective:    Patient ID: Kimberly Hobbs, female    DOB: [birth year], 63 y.o.   MRN: [id number]  HPI  Kimberly Hobbs is a 63 y.o. year old female  who  has a past medical history of CKD stage 3 due to type 2 diabetes mellitus (HCC) (05/15/2021), COVID-19, Hyperlipidemia, Hypertension, Pain in both lower extremities (07/23/2019), and Rheumatic heart disease.   They are presenting to PM&R clinic as a new patient for pain management evaluation. They were referred by Kimberly Hobbs for treatment of low back pain.  She cant stand for long. Has to use a walker.  Pain is severe if she walks without this. Pain started about 2 years ago but is worsening. 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  Pain is mostly on the right side of her lower back. Sometimes both sides. It doesn't shoot down her legs.  Lying down helps the pain.  She reports her activities are very limited by her pain.  She has been taking hydrocodone  5/325 1 pain is very severe. History of prosthetic valves, schedule for ablation for A-fib       Red flag symptoms: No red flags for back pain endorsed in Hx or ROS   Medications tried: Topical medications - doesn't recall name, didn't help Nsaids- cannot take for cardiac reasons Tylenol - doesn't help Opiates  Hydrocodone  -she uses sparingly when pain is very severe, this does help control her pain Gabapentin  / Lyrica  Has not tried  Tramadol -does not remember TCAs  - Denies  She is currently on Celexa  for her anxiety, this medication visit has been working very well for her Robaxin  helps pain   Other treatments: PT/OT  has not tried, in afib waiting on heart ablation  TENs unit - has not tried  Injections-denies Surgery - denies      Interal History 08/23/22   Kimberly Hobbs is here for follow-up regarding her chronic pain.  She reports she continues to have right-sided back pain.  She also has some aching pain in her  legs bilaterally.  She reports she has her cardiac ablation procedures 09/05/2022 so after this time she would be interested in getting facet joint injections.  Pain is worsened with standing and she continues to use her walker for ambulation.  Pain continues to be improved with lying down.  Vicodin 7.5 is helping her control this pain.  She tried to use this sparingly only when pain is very severe and 30 tablets has lasted her about a month and a half.  She is not having any side effects with this medication.   Interal History 10/29/2022 Kimberly Hobbs is here for follow-up regarding her right-sided back pain with aching pain in her legs.  She continues to use occasional Vicodin 7.5 which helps keep her pain controlled when it is very severe.  She tries to use this sparingly.  She has been started on gabapentin  100 mg at bedtime which has greatly reduced her bilateral leg pain.  She reports her Celexa  was discontinued because it was no longer helping.  She says she is currently not on any antidepressant medicines for anxiety.  She has had a few sessions of physical therapy but has not had significant benefits at this time. Patient has been using occasional Flexeril  10 mg with benefit, she does not use this when she takes for Norco.       Interal History 01/07/23 [name] is here for follow-up regarding her right-sided back pain and leg pain.  She continues to use Vicodin 7.5 mg sparingly when the pain is severe.  She still has most of her pills from her last refill.  Leg pain continues to be improved since starting gabapentin .  She stopped using Cymbalta , says she did not tolerate this and made her feel bad.  She reports pain is not significantly improved after physical therapy.     Interal History 03/19/22 Kimberly Hobbs reports that she has been having worse pain in her lower back more on the right side.  When she does activity she will have pain all the way across her back.  Hydrocodone  not helping pain is  much as it previously did.  Patient is currently following with cardiology working on treatment of her arrhythmia.  She is now restarted on amiodarone .  She had a dual-chamber ICD implanted 02/10/2023.  Interval history 04/17/2023 Kimberly Hobbs is here for follow-up regarding her back pain.  She reports her back pain is doing better, not having help her husband as much as he has been more active himself.  He does have another surgery scheduled.  She is only requiring use of oxycodone  sparingly and it is helping her pain.   Interval history 07/15/2023 Patient reports her last use of oxycodone  was yesterday.  She has been using oxycodone  sparingly, reports it decreases her pain but is not strong enough to fully control her back pain.  She is limited in household activities due to her back pain.  She is not able to do a lot of cooking or cleaning and has to rest a lot because of her pain.  Pain is present all the time but is worsened with activity.  Interval history 09/09/23 Patient is here for follow-up regarding her chronic pain.  She was not able to tolerate buprenorphine  due to body wide rash.  Oxycodone  is not helping as much as it was previously.  She typically takes this at night but sometimes takes it twice a day.  No side effects with the medication.  Her pain makes it difficult to do chores and other activities at home.  Pain Inventory Average Pain 8 Pain Right Now 6 My pain is sharp and stabbing, aching  In the last 24 hours, has pain interfered with the following? General activity 9 Relation with others 0 Enjoyment of life 9 What TIME of day is your pain at its worst? daytime, evening, and night Sleep (in general) Fair  Pain is worse with: walking, bending, standing, and some activites Pain improves with: Medication Relief from Meds: 2  Family History  Problem Relation Age of Onset   Drug abuse Mother    Alcohol abuse Mother    Anxiety disorder Mother    Cancer Mother     Hypertension Mother    Diabetes Mother    Hyperlipidemia Mother    Heart disease Mother    Drug abuse Father    Alcohol abuse Father    Anxiety disorder Father    Cancer Father    Hypertension Father    Diabetes Father    Heart disease Father    Hypertension Sister    Hypertension Brother    Hypertension Brother    Schizophrenia Maternal Grandmother    Cancer Daughter    COPD Daughter    Healthy Child    Healthy Child    Healthy Child    Healthy Child    Breast cancer Neg Hx  Social History   Socioeconomic History   Marital status: Divorced    Spouse name: Not on file   Number of children: 4   Years of education: Not on file   Highest education level: GED or equivalent  Occupational History   Occupation: unemployed    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  Tobacco Use   Smoking status: Former    Current packs/day: 0.00    Average packs/day: 1.6 packs/day for 50.0 years (80.0 ttl pk-yrs)    Types: Cigarettes    Start date: 12/23/1976    Quit date: 12/23/2016    Years since quitting: 6.7    Passive exposure: Past   Smokeless tobacco: Never   Tobacco comments:    Former smoker 01/24/23  Vaping Use   Vaping status: Never Used  Substance and Sexual Activity   Alcohol use: Never   Drug use: Never   Sexual activity: Not Currently  Other Topics Concern   Not on file  Social History Narrative   ** Merged History Encounter ** Epworth Sleepiness Scale = 4 (as of 06/28/2015)   Right handed    Social Drivers of Health   Financial Resource Strain: Low Risk  (10/14/2022)   Overall Financial Resource Strain (CARDIA)    Difficulty of Paying Living Expenses: Not hard at all  Food Insecurity: No Food Insecurity (05/12/2023)   Hunger Vital Sign    Worried About Running Out of Food in the Last Year: Never true    Ran Out of Food in the Last Year: Never true  Transportation Needs: No Transportation Needs (05/12/2023)   PRAPARE - Scientist, research (physical sciences) (Medical): No    Lack of Transportation (Non-Medical): No  Physical Activity: Inactive (10/14/2022)   Exercise Vital Sign    Days of Exercise per Week: 0 days    Minutes of Exercise per Session: 0 min  Stress: Stress Concern Present (10/14/2022)   [employer] of Occupational Health - Occupational Stress Questionnaire    Feeling of Stress : To some extent  Social Connections: Moderately Isolated (10/14/2022)   Social Connection and Isolation Panel    Frequency of Communication with Friends and Family: More than three times a week    Frequency of Social Gatherings with Friends and Family: Never    Attends Religious Services: Never    Database administrator or Organizations: No    Attends Banker Meetings: Never    Marital Status: Living with partner   Past Surgical History:  Procedure Laterality Date   ATRIAL FIBRILLATION ABLATION N/A 09/05/2022   Procedure: ATRIAL FIBRILLATION ABLATION;  Surgeon: Inocencio Soyla Lunger, MD;  Location: [facility];  Service: Cardiovascular;  Laterality: N/A;   ATRIAL FIBRILLATION ABLATION N/A 07/24/2023   Procedure: ATRIAL FIBRILLATION ABLATION;  Surgeon: Inocencio Soyla Lunger, MD;  Location: [facility];  Service: Cardiovascular;  Laterality: N/A;   CARDIAC CATHETERIZATION     CARDIAC VALVE REPLACEMENT     CARDIOVERSION N/A 04/02/2022   Procedure: CARDIOVERSION;  Surgeon: Elmira Newman PARAS, MD;  Location: [facility];  Service: Cardiovascular;  Laterality: N/A;   CARDIOVERSION N/A 04/27/2022   Procedure: CARDIOVERSION;  Surgeon: Elmira Newman PARAS, MD;  Location: [facility];  Service: Cardiovascular;  Laterality: N/A;   CARDIOVERSION N/A 03/27/2023   Procedure: CARDIOVERSION;  Surgeon: Alvan Ronal BRAVO, MD;  Location: [facility];  Service: Cardiovascular;  Laterality: N/A;   CARDIOVERSION N/A 05/06/2023   Procedure: CARDIOVERSION;  Surgeon: Sheena,  Kardie, DO;  Location: [facility];  Service:  Cardiovascular;  Laterality: N/A;   CARDIOVERSION N/A 06/02/2023   Procedure: CARDIOVERSION;  Surgeon: Jeffrie Oneil BROCKS, MD;  Location: [facility];  Service: Cardiovascular;  Laterality: N/A;   CHOLECYSTECTOMY     CORONARY ARTERY BYPASS GRAFT     CORONARY/GRAFT ANGIOGRAPHY N/A 08/18/2018   Procedure: CORONARY/GRAFT ANGIOGRAPHY;  Surgeon: Elmira Newman PARAS, MD;  Location: [facility];  Service: Cardiovascular;  Laterality: N/A;   ELECTROPHYSIOLOGY STUDY N/A 02/04/2023   Procedure: ELECTROPHYSIOLOGY STUDY;  Surgeon: Inocencio Soyla Lunger, MD;  [facility]: [facility];  Service: Cardiovascular;  Laterality: N/A;   ICD IMPLANT N/A 02/10/2023   Procedure: ICD IMPLANT;  Surgeon: Inocencio Soyla Lunger, MD;  Location: [facility];  Service: Cardiovascular;  Laterality: N/A;   LEG SURGERY     metal plate in leg   RIGHT HEART CATH AND CORONARY/GRAFT ANGIOGRAPHY N/A 07/21/2018   Procedure: RIGHT HEART CATH AND CORONARY/GRAFT ANGIOGRAPHY;  Surgeon: Elmira Newman PARAS, MD;  [facility];  Service: Cardiovascular;  Laterality: N/A;   TEE WITHOUT CARDIOVERSION N/A 07/27/2015   Procedure: TRANSESOPHAGEAL ECHOCARDIOGRAM (TEE);  Surgeon: Jerel Balding, MD;  Location: [facility];  Service: Cardiovascular;  Laterality: N/A;   TEE WITHOUT CARDIOVERSION N/A 07/21/2018   Procedure: TRANSESOPHAGEAL ECHOCARDIOGRAM (TEE);  Surgeon: Elmira Newman PARAS, MD;  Location: [facility] ENDOSCOPY;  Service: Cardiovascular;  Laterality: N/A;   TONSILLECTOMY AND ADENOIDECTOMY     TRANSESOPHAGEAL ECHOCARDIOGRAM (CATH LAB) N/A 05/06/2023   Procedure: TRANSESOPHAGEAL ECHOCARDIOGRAM;  Surgeon: Sheena Pugh, DO;  [facility]: [facility];  Service: Cardiovascular;  Laterality: N/A;   TRANSESOPHAGEAL ECHOCARDIOGRAM (CATH LAB) N/A 06/02/2023   Procedure: TRANSESOPHAGEAL ECHOCARDIOGRAM;  Surgeon: Jeffrie Oneil BROCKS, MD;  [facility]: [facility];  Service: Cardiovascular;  Laterality: N/A;    Past Surgical History:  Procedure Laterality Date   ATRIAL FIBRILLATION ABLATION N/A 09/05/2022   Procedure: ATRIAL FIBRILLATION ABLATION;  Surgeon: Inocencio Soyla Lunger, MD;  [facility];  Service: Cardiovascular;  Laterality: N/A;   ATRIAL FIBRILLATION ABLATION N/A 07/24/2023   Procedure: ATRIAL FIBRILLATION ABLATION;  Surgeon: Inocencio Soyla Lunger, MD;  [facility];  Service: Cardiovascular;  Laterality: N/A;   CARDIAC CATHETERIZATION     CARDIAC VALVE REPLACEMENT     CARDIOVERSION N/A 04/02/2022   Procedure: CARDIOVERSION;  Surgeon: Elmira Newman PARAS, MD;  [facility];  Service: Cardiovascular;  Laterality: N/A;   CARDIOVERSION N/A 04/27/2022   Procedure: CARDIOVERSION;  Surgeon: Elmira Newman PARAS, MD;  Location: [facility];  Service: Cardiovascular;  Laterality: N/A;   CARDIOVERSION N/A 03/27/2023   Procedure: CARDIOVERSION;  Surgeon: Alvan Ronal BRAVO, MD;  [facility]: [facility];  Service: Cardiovascular;  Laterality: N/A;   CARDIOVERSION N/A 05/06/2023   Procedure: CARDIOVERSION;  Surgeon: Sheena Pugh, DO;  [facility]: [facility];  Service: Cardiovascular;  Laterality: N/A;   CARDIOVERSION N/A 06/02/2023   Procedure: CARDIOVERSION;  Surgeon: Jeffrie Oneil BROCKS, MD;  [facility];  Service: Cardiovascular;  Laterality: N/A;   CHOLECYSTECTOMY     CORONARY ARTERY BYPASS GRAFT     CORONARY/GRAFT ANGIOGRAPHY N/A 08/18/2018   Procedure: CORONARY/GRAFT ANGIOGRAPHY;  Surgeon: Elmira Newman PARAS, MD;  [facility]: [facility];  Service: Cardiovascular;  Laterality: N/A;   ELECTROPHYSIOLOGY STUDY N/A 02/04/2023   Procedure: ELECTROPHYSIOLOGY STUDY;  Surgeon: Inocencio Soyla Lunger, MD;  [facility]: [facility];  Service: Cardiovascular;  Laterality: N/A;   ICD IMPLANT N/A 02/10/2023   Procedure: ICD IMPLANT;  Surgeon: Inocencio Soyla Lunger, MD;  Location: [facility] INVASIVE CV LAB;  Service: Cardiovascular;  Laterality:  N/A;   LEG SURGERY     metal plate in leg   RIGHT HEART CATH AND CORONARY/GRAFT ANGIOGRAPHY N/A 07/21/2018   Procedure: RIGHT HEART CATH AND CORONARY/GRAFT ANGIOGRAPHY;  Surgeon: Elmira Newman PARAS, MD;  [facility]: [facility];  Service: Cardiovascular;  Laterality: N/A;   TEE WITHOUT CARDIOVERSION N/A 07/27/2015   Procedure: TRANSESOPHAGEAL ECHOCARDIOGRAM (TEE);  Surgeon: Jerel Balding, MD;  Location: [facility] ENDOSCOPY;  Service: Cardiovascular;  Laterality: N/A;   TEE WITHOUT CARDIOVERSION N/A 07/21/2018   Procedure: TRANSESOPHAGEAL ECHOCARDIOGRAM (TEE);  Surgeon: Elmira Newman PARAS, MD;  Location: [facility] ENDOSCOPY;  Service: Cardiovascular;  Laterality: N/A;   TONSILLECTOMY AND ADENOIDECTOMY     TRANSESOPHAGEAL ECHOCARDIOGRAM (CATH LAB) N/A 05/06/2023   Procedure: TRANSESOPHAGEAL ECHOCARDIOGRAM;  Surgeon: Sheena Pugh, DO;  [facility]: [facility];  Service: Cardiovascular;  Laterality: N/A;   TRANSESOPHAGEAL ECHOCARDIOGRAM (CATH LAB) N/A 06/02/2023   Procedure: TRANSESOPHAGEAL ECHOCARDIOGRAM;  Surgeon: Jeffrie Oneil BROCKS, MD;  Location: [facility];  Service: Cardiovascular;  Laterality: N/A;   Past Medical History:  Diagnosis Date   Allergy  ?   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 01/01/2017 01/01/2017   BP 108/71 (BP Location: Left Arm, Patient Position: Sitting, Cuff Size: Large)   Pulse 76   Ht 5' 5 (1.651 m)   Wt 207 lb 12.8 oz (94.3 kg)   LMP  06/20/2015   SpO2 91%   BMI 34.58 kg/m   Opioid Risk Score:   Fall Risk Score:  `1  Depression screen [facility] 2/9     07/31/2023    9:03 AM 04/14/2023    1:38 PM 01/07/2023    9:27 AM 12/23/2022   10:25 AM 10/14/2022   10:49 AM 08/23/2022   10:32 AM 07/08/2022    9:52 AM  Depression screen PHQ 2/9  Decreased Interest   1 0 0 1 0  Down, Depressed, Hopeless   1 0 1 1 0  PHQ - 2 Score   2 0 1 2 0  Altered sleeping    3 0    Tired, decreased energy    1 1    Change in appetite    3 0    Feeling bad or failure about yourself     0 0    Trouble concentrating    0 0    Moving slowly or fidgety/restless    0 0    Suicidal thoughts    0 0    PHQ-9 Score    7 2    Difficult doing work/chores    Not difficult at all Somewhat difficult       Information is confidential and restricted. Go to Review Flowsheets to unlock data.  Review of Systems  Musculoskeletal:  Positive for back pain.  All other systems reviewed and are negative.      Objective:   Physical Exam  Gen: no distress, normal appearing HEENT: oral mucosa pink and moist, NCAT Cardio: Reg rate Chest: normal effort, normal rate of breathing Abd: soft, non-distended Ext: no edema Psych: pleasant, normal affect Skin: intact Neuro: Alert and awake, follows commands, cranial nerves II through XII intact, intact insight and judgment, speech and language intact Strength 5 out of 5 in all 4 extremities Sensation intact light touch in all 4 extremities No ankle clonus Musculoskeletal:  Tenderness to palpation in bilateral paraspinal muscles although greater on the right side Slump test negative bilaterally Back pain is worsened with spinal extension SI joint tenderness not checked today   Prior exam:  Mild tenderness noted over the right SI joint   MRI L-spine 1/17/2024IMPRESSION: 1. L4-L5 moderate facet arthropathy, which can be a cause of back pain. Narrowing of the lateral recesses at this level could affect the  descending L5 nerve roots. 2. No spinal canal stenosis or neural foraminal narrowing.       Assessment & Plan:   Chronic lower back pain bilateral but worse on the right without radiculopathy -No improvement with Tylenol , unable to take NSAIDs due to cardiac history - Intermittent right SI joint tenderness has been noted, continue to monitor -Pain is primarily axial in her lower back, suspect this may be facet joint mediated -List of foods for pain provided at prior visit -ORT low -TENS unit, nexwave device ordered prior visit -Discontinued Norco 7.5 twice daily prior visit -Cymbalta  20 mg daily- She did not tolerate -She reports Physical Therapy provided minimal improvement -Pain agreement completed prior visit - Continue to mitten use of Flexeril  10 mg up to 3 times daily as needed -Discussed facet joint injections B/L L3-4, L4-5, R L5-S1-I think this would be a good option.  She is currently on anticoagulation but could be an option if she can come off of this for short time with the procedures.  She is going to check with the doctor that monitors her anticoagulation about this. -Continue UDS, Pill Counts, PDMP monitoring  -She is on gabapentin  for restless leg.  She reports tightness in her legs at night that the gabapentin  reduced.  We had tried 200 mg gabapentin , but now back on 100 mg dose at night ordered by different doctor.  Patient does not recall why this was changed but will continue with current dose for now. -Pill counts consistent today - Increase Percocet to 7.5 mg twice daily  - Butrans  added to allergy  list, caused rash   Chronic anxiety, denies SI or HI -No longer on Celexa , Cymbalta - she says it made her feel bad -Continue follow-up with mental health as directed  Prior warnings -She forgot pills prior visit- and had used codeine  cough syrup and  warning was given at the time

## 2023-09-10 DIAGNOSIS — K529 Noninfective gastroenteritis and colitis, unspecified: Secondary | ICD-10-CM | POA: Diagnosis not present
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
## 2023-09-22 ENCOUNTER — Ambulatory Visit (INDEPENDENT_AMBULATORY_CARE_PROVIDER_SITE_OTHER): Admitting: Psychiatry

## 2023-09-22 ENCOUNTER — Other Ambulatory Visit
Admission: RE | Admit: 2023-09-22 | Discharge: 2023-09-22 | Disposition: A | Discharge status: Home / Self Care | Source: Ambulatory Visit | Attending: Psychiatry | Admitting: Psychiatry

## 2023-09-22 ENCOUNTER — Other Ambulatory Visit: Payer: Self-pay

## 2023-09-22 ENCOUNTER — Encounter: Payer: Self-pay | Admitting: Psychiatry

## 2023-09-22 VITALS — BP 103/71 | HR 81 | Temp 97.9°F | Ht 65.0 in | Wt 205.4 lb

## 2023-09-22 DIAGNOSIS — Z79899 Other long term (current) drug therapy: Secondary | ICD-10-CM | POA: Diagnosis not present

## 2023-09-22 DIAGNOSIS — F331 Major depressive disorder, recurrent, moderate: Secondary | ICD-10-CM

## 2023-09-22 DIAGNOSIS — G4700 Insomnia, unspecified: Secondary | ICD-10-CM

## 2023-09-22 DIAGNOSIS — F4310 Post-traumatic stress disorder, unspecified: Secondary | ICD-10-CM | POA: Diagnosis not present

## 2023-09-22 DIAGNOSIS — F411 Generalized anxiety disorder: Secondary | ICD-10-CM | POA: Diagnosis not present

## 2023-09-22 MED ORDER — RAMELTEON 8 MG PO TABS
8.0000 mg | ORAL_TABLET | Freq: Every day | ORAL | 1 refills | Status: DC
Start: 2023-09-22 — End: 2023-11-04

## 2023-09-22 MED ORDER — CLONAZEPAM 0.5 MG PO TABS: 0.5000 mg | ORAL_TABLET | Freq: Every day | ORAL | 0 refills | Status: DC | PRN

## 2023-09-22 NOTE — Telephone Encounter (Signed)
 Could you contact her- the test was ordered at [facility]. I explained directions to her earlier, but she may need additional support navigating there.

## 2023-09-22 NOTE — Telephone Encounter (Signed)
 Called patient she has been and had the test done

## 2023-09-22 NOTE — Patient Instructions (Addendum)
 Hold prazosin   Continue clonazepam  0.5 mg at night as needed for insomnia, anxiety  Start melatonin 6 mg , 2 hours before going to sleep,  If the above does not work, take ramelteon  8 mg at night  Obtain lab (UDS) at [facility] Next appointment-  9/16 at 3 pm

## 2023-09-23 ENCOUNTER — Ambulatory Visit: Payer: Self-pay | Admitting: Psychiatry

## 2023-09-23 LAB — URINE DRUGS OF ABUSE SCREEN W ALC, ROUTINE (REF LAB)
Amphetamines, Urine: NEGATIVE ng/mL
Barbiturate, Ur: NEGATIVE ng/mL
Benzodiazepine Quant, Ur: NEGATIVE ng/mL
Cannabinoid Quant, Ur: NEGATIVE ng/mL
Cocaine (Metab.): NEGATIVE ng/mL
Creatinine, Urine: 28.4 mg/dL (ref 20.0–300.0)
Ethanol U, Quan: NEGATIVE %
Methadone Screen, Urine: NEGATIVE ng/mL
Nitrite Urine, Quantitative: NEGATIVE ug/mL
OPIATE SCREEN URINE: NEGATIVE ng/mL
Phencyclidine, Ur: NEGATIVE ng/mL
Propoxyphene, Urine: NEGATIVE ng/mL
pH, Urine: 6.2 (ref 4.5–8.9)

## 2023-09-25 ENCOUNTER — Encounter: Payer: Self-pay | Admitting: Licensed Clinical Social Worker

## 2023-09-25 ENCOUNTER — Ambulatory Visit (INDEPENDENT_AMBULATORY_CARE_PROVIDER_SITE_OTHER): Admitting: Licensed Clinical Social Worker

## 2023-09-25 DIAGNOSIS — F331 Major depressive disorder, recurrent, moderate: Secondary | ICD-10-CM

## 2023-09-25 DIAGNOSIS — F411 Generalized anxiety disorder: Secondary | ICD-10-CM

## 2023-09-25 DIAGNOSIS — F4310 Post-traumatic stress disorder, unspecified: Secondary | ICD-10-CM | POA: Diagnosis not present

## 2023-09-25 NOTE — Progress Notes (Signed)
 THERAPIST PROGRESS NOTE  Virtual Visit via Video Note  I connected with Kimberly Hobbs on 09/25/23 at 10:57am by a video enabled telemedicine application and verified that I am speaking with the correct person using two identifiers.  Location: Patient: Address on file Provider: [facility]   I discussed the limitations of evaluation and management by telemedicine and the availability of in person appointments. The patient expressed understanding and agreed to proceed.   I discussed the assessment and treatment plan with the patient. The patient was provided an opportunity to ask questions and all were answered. The patient agreed with the plan and demonstrated an understanding of the instructions.   The patient was advised to call back or seek an in-person evaluation if the symptoms worsen or if the condition fails to improve as anticipated.  I provided 48 minutes of non-face-to-face time during this encounter.   Kimberly KATHEE Husband, LCSW   Session Time: 10:57-11:45  Participation Level: Active  Behavioral Response: CasualAlertEuthymic  Type of Therapy: Individual Therapy  Treatment Goals addressed:  Active     BH CCP Acute or Chronic Trauma Reaction     LTG: Recall traumatic events without becoming overwhelmed with negative emotions     Start:  07/31/23    Expected End:  12/20/23         STG: I want to be able to say what I want to say.     Start:  07/31/23    Expected End:  12/20/23         STG: Kimberly Hobbs will identify coping strategies to deal with trauma memories and the associated emotional reaction     Start:  07/31/23    Expected End:  12/20/23         Cooperate with trauma-focused psychotherapy techniques to reduce emotional reaction to the traumatic event      Start:  07/31/23         Work with Kimberly to construct a list of the situations, people, & places that Kimberly Hobbs evoke the most distressing symptoms; suggest that they keep a journal of instances of  stress being triggered     Start:  07/31/23         Teach Kimberly coping strategies (e.g., writing down thoughts and feelings in a journal; taking deep, slow breaths; calling a support person to talk about memories) to deal with trauma memories and sudden emotional reactions without becoming emotionally nu     Start:  07/31/23         Provide Kimberly education on communication patterns in relationships     Start:  07/31/23            ProgressTowards Goals: Progressing  Interventions: Strength-based, Supportive, Reframing, and Other: EMDR  Summary: Kimberly Hobbs is a 63 y.o. female who presents with little interest, low mood, fatigue, difficulties with sleep, changes to her appetite, falling anxious, uncontrollable worry, restlessness, flashbacks and hypervigilance. Pt was oriented times 5. Pt was cooperative and engaged. Pt denies SI/HI/AVH.     The patient reports that she hasn't been up to much lately, expressing a lack of motivation to engage in activities. She states that there have been no changes to her medications and that the arthritis in her back interferes with her ability to complete tasks. She shares that she can only walk for about five minutes before experiencing chronic pain and mentions that her boyfriend serves as her caretaker.  The patient often struggles to identify coping mechanisms and notes, I  just want to learn how to deal with the past and present. She explains that her thoughts about the past keep her awake at night; she has to keep the TV on and occasionally experiences flashbacks and nightmares.  The clinician educated the patient about EMDR (Eye Movement Desensitization and Reprocessing) treatment, and the patient expressed interest in beginning this therapy. She identified the negative core belief, I'm unlovable, and reflected on past interactions with her parents that led her to feel unloved. She also shares that she often over-apologizes in her  relationships and personalizes much of her trauma.  Suicidal/Homicidal: Nowithout intent/plan  Therapist Response: Clinician utilized active and supportive reflection to create a safe space for patient to process recent life events.  Clinician assessed for current symptoms, safety, stressors since last session.Cln educated the patine on EMDR treatment. Supported the patient in processing her childhood trauma and named the negative cognition I'm unlovable.    Plan: Return again in 2 weeks.  Diagnosis: PTSD (post-traumatic stress disorder)  MDD (major depressive disorder), recurrent episode, moderate (HCC)  Anxiety state   Collaboration of Care: [facility] psychiatrist can access notes and cln. Will review psychiatrists' notes. Check in with the patient and will see LCSW per availability. Patient agreed with treatment recommendations.   Patient/Guardian was advised Release of Information must be obtained prior to any record release in order to collaborate their care with an outside provider. Patient/Guardian was advised if they have not already done so to contact the registration department to sign all necessary forms in order for us  to release information regarding their care.   Consent: Patient/Guardian gives verbal consent for treatment and assignment of benefits for services provided during this visit. Patient/Guardian expressed understanding and agreed to proceed.   Kimberly KATHEE Husband, LCSW 09/25/2023

## 2023-09-27 DIAGNOSIS — Z7901 Long term (current) use of anticoagulants: Secondary | ICD-10-CM | POA: Diagnosis not present

## 2023-09-27 DIAGNOSIS — I480 Paroxysmal atrial fibrillation: Secondary | ICD-10-CM | POA: Diagnosis not present

## 2023-09-27 DIAGNOSIS — Z952 Presence of prosthetic heart valve: Secondary | ICD-10-CM | POA: Diagnosis not present

## 2023-09-29 ENCOUNTER — Other Ambulatory Visit: Payer: Self-pay | Admitting: Psychiatry

## 2023-09-29 MED ORDER — HYDROXYZINE HCL 50 MG PO TABS
50.0000 mg | ORAL_TABLET | Freq: Every day | ORAL | 1 refills | Status: DC | PRN
Start: 2023-09-29 — End: 2023-11-04

## 2023-10-02 ENCOUNTER — Encounter: Admitting: Registered Nurse

## 2023-10-03 ENCOUNTER — Encounter: Payer: Self-pay | Admitting: Physical Medicine & Rehabilitation

## 2023-10-03 ENCOUNTER — Encounter: Attending: Physical Medicine & Rehabilitation | Admitting: Physical Medicine & Rehabilitation

## 2023-10-03 VITALS — BP 114/73 | HR 77 | Ht 65.0 in | Wt 205.0 lb

## 2023-10-03 DIAGNOSIS — G8929 Other chronic pain: Secondary | ICD-10-CM | POA: Insufficient documentation

## 2023-10-03 DIAGNOSIS — G2581 Restless legs syndrome: Secondary | ICD-10-CM | POA: Diagnosis not present

## 2023-10-03 DIAGNOSIS — M545 Low back pain, unspecified: Secondary | ICD-10-CM | POA: Diagnosis not present

## 2023-10-03 DIAGNOSIS — Z79891 Long term (current) use of opiate analgesic: Secondary | ICD-10-CM | POA: Diagnosis not present

## 2023-10-03 MED ORDER — OXYCODONE-ACETAMINOPHEN 7.5-325 MG PO TABS: 1.0000 | ORAL_TABLET | Freq: Two times a day (BID) | ORAL | 0 refills | Status: DC | PRN

## 2023-10-03 MED ORDER — GABAPENTIN 100 MG PO CAPS: 200.0000 mg | ORAL_CAPSULE | Freq: Every day | ORAL | 3 refills | Status: DC

## 2023-10-03 NOTE — Progress Notes (Signed)
 Subjective:    Patient ID: Kimberly Hobbs, female    DOB: Mar 12, 1960, 63 y.o.   MRN: [id number]  HPI  Kimberly Hobbs is a 63 y.o. year old female  who  has a past medical history of CKD stage 3 due to type 2 diabetes mellitus (HCC) (05/15/2021), COVID-19, Hyperlipidemia, Hypertension, Pain in both lower extremities (07/23/2019), and Rheumatic heart disease.   They are presenting to PM&R clinic as a new patient for pain management evaluation. They were referred by [name] for treatment of low back pain.  She cant stand for long. Has to use a walker.  Pain is severe if she walks without this. Pain started about 2 years ago but is worsening. 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  Pain is mostly on the right side of her lower back. Sometimes both sides. It doesn't shoot down her legs.  Lying down helps the pain.  She reports her activities are very limited by her pain.  She has been taking hydrocodone  5/325 1 pain is very severe. History of prosthetic valves, schedule for ablation for A-fib       Red flag symptoms: No red flags for back pain endorsed in Hx or ROS   Medications tried: Topical medications - doesn't recall name, didn't help Nsaids- cannot take for cardiac reasons Tylenol - doesn't help Opiates  Hydrocodone  -she uses sparingly when pain is very severe, this does help control her pain Gabapentin  / Lyrica  Has not tried  Tramadol -does not remember TCAs  - Denies  She is currently on Celexa  for her anxiety, this medication visit has been working very well for her Robaxin  helps pain   Other treatments: PT/OT  has not tried, in afib waiting on heart ablation  TENs unit - has not tried  Injections-denies Surgery - denies      Interal History 08/23/22   Kimberly Hobbs is here for follow-up regarding her chronic pain.  She reports she continues to have right-sided back pain.  She also has some aching pain in her  legs bilaterally.  She reports she has her cardiac ablation procedures 09/05/2022 so after this time she would be interested in getting facet joint injections.  Pain is worsened with standing and she continues to use her walker for ambulation.  Pain continues to be improved with lying down.  Vicodin 7.5 is helping her control this pain.  She tried to use this sparingly only when pain is very severe and 30 tablets has lasted her about a month and a half.  She is not having any side effects with this medication.   Interal History 10/29/2022 Kimberly Hobbs is here for follow-up regarding her right-sided back pain with aching pain in her legs.  She continues to use occasional Vicodin 7.5 which helps keep her pain controlled when it is very severe.  She tries to use this sparingly.  She has been started on gabapentin  100 mg at bedtime which has greatly reduced her bilateral leg pain.  She reports her Celexa  was discontinued because it was no longer helping.  She says she is currently not on any antidepressant medicines for anxiety.  She has had a few sessions of physical therapy but has not had significant benefits at this time. Patient has been using occasional Flexeril  10 mg with benefit, she does not use this when she takes for Norco.       Interal History 01/07/23 Ms.  Grace is here for follow-up regarding her right-sided back pain and leg pain.  She continues to use Vicodin 7.5 mg sparingly when the pain is severe.  She still has most of her pills from her last refill.  Leg pain continues to be improved since starting gabapentin .  She stopped using Cymbalta , says she did not tolerate this and made her feel bad.  She reports pain is not significantly improved after physical therapy.     Interal History 03/19/22 Kimberly Hobbs reports that she has been having worse pain in her lower back more on the right side.  When she does activity she will have pain all the way across her back.  Hydrocodone  not helping pain is  much as it previously did.  Patient is currently following with cardiology working on treatment of her arrhythmia.  She is now restarted on amiodarone .  She had a dual-chamber ICD implanted 02/10/2023.  Interval history 04/17/2023 Kimberly Hobbs is here for follow-up regarding her back pain.  She reports her back pain is doing better, not having help her husband as much as he has been more active himself.  He does have another surgery scheduled.  She is only requiring use of oxycodone  sparingly and it is helping her pain.   Interval history 07/15/2023 Patient reports her last use of oxycodone  was yesterday.  She has been using oxycodone  sparingly, reports it decreases her pain but is not strong enough to fully control her back pain.  She is limited in household activities due to her back pain.  She is not able to do a lot of cooking or cleaning and has to rest a lot because of her pain.  Pain is present all the time but is worsened with activity.  Interval history 09/09/23 Patient is here for follow-up regarding her chronic pain.  She was not able to tolerate buprenorphine  due to body wide rash.  Oxycodone  is not helping as much as it was previously.  She typically takes this at night but sometimes takes it twice a day.  No side effects with the medication.  Her pain makes it difficult to do chores and other activities at home.   Interval history 10/03/23  Reports chronic lower back pain worsened for approximately 1 year. Pain worsened when lifting mother, which significantly aggravated symptoms. Pain primarily localized to lower back without radiation down legs. Pain worsens with activity and prolonged standing. Reports being in tears when getting up in morning and cooking breakfast. Pain improves when off feet or sitting with pillows for support.  Percocet - currently taking once nightly at bedtime, prescribed every 12 hours but patient only using at night. Notes not waking up in pain as much since  starting medication.  Pt has not used it in the day, so she is not sure if it will help her pain at this time.    Gabapentin  100mg  - taking once nightly about 1 hour before bed.  Initially helpful but now reports no benefit. Originally prescribed for restless leg syndrome. Restless leg syndrome - ongoing, keeps awake more than back pain. Reports pickle juice provides some relief.   Pain Inventory Average Pain 8 Pain Right Now 6 My pain is sharp and stabbing, aching  In the last 24 hours, has pain interfered with the following? General activity 9 Relation with others 0 Enjoyment of life 9 What TIME of day is your pain at its worst? daytime, evening, and night Sleep (in general) Fair  Pain is worse with:  walking, bending, standing, and some activites Pain improves with: Medication Relief from Meds: 2  Family History  Problem Relation Age of Onset   Drug abuse Mother    Alcohol abuse Mother    Anxiety disorder Mother    Cancer Mother    Hypertension Mother    Diabetes Mother    Hyperlipidemia Mother    Heart disease Mother    Drug abuse Father    Alcohol abuse Father    Anxiety disorder Father    Cancer Father    Hypertension Father    Diabetes Father    Heart disease Father    Hypertension Sister    Hypertension Brother    Hypertension Brother    Schizophrenia Maternal Grandmother    Cancer Daughter    COPD Daughter    Healthy Child    Healthy Child    Healthy Child    Healthy Child    Breast cancer Neg Hx    Social History   Socioeconomic History   Marital status: Divorced    Spouse name: Not on file   Number of children: 4   Years of education: Not on file   Highest education level: GED or equivalent  Occupational History   Occupation: unemployed    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  Tobacco Use   Smoking status: Former    Current packs/day: 0.00    Average packs/day: 1.6 packs/day for 50.0 years (80.0 ttl pk-yrs)    Types:  Cigarettes    Start date: 12/23/1976    Quit date: 12/23/2016    Years since quitting: 6.7    Passive exposure: Past   Smokeless tobacco: Never   Tobacco comments:    Former smoker 01/24/23  Vaping Use   Vaping status: Never Used  Substance and Sexual Activity   Alcohol use: Never   Drug use: Never   Sexual activity: Not Currently  Other Topics Concern   Not on file  Social History Narrative   ** Merged History Encounter ** Epworth Sleepiness Scale = 4 (as of 06/28/2015)   Right handed    Social Drivers of Health   Financial Resource Strain: Low Risk  (10/14/2022)   Overall Financial Resource Strain (CARDIA)    Difficulty of Paying Living Expenses: Not hard at all  Food Insecurity: No Food Insecurity (05/12/2023)   Hunger Vital Sign    Worried About Running Out of Food in the Last Year: Never true    Ran Out of Food in the Last Year: Never true  Transportation Needs: No Transportation Needs (05/12/2023)   PRAPARE - Administrator, Civil Service (Medical): No    Lack of Transportation (Non-Medical): No  Physical Activity: Inactive (10/14/2022)   Exercise Vital Sign    Days of Exercise per Week: 0 days    Minutes of Exercise per Session: 0 min  Stress: Stress Concern Present (10/14/2022)   [employer] of Occupational Health - Occupational Stress Questionnaire    Feeling of Stress : To some extent  Social Connections: Moderately Isolated (10/14/2022)   Social Connection and Isolation Panel    Frequency of Communication with Friends and Family: More than three times a week    Frequency of Social Gatherings with Friends and Family: Never    Attends Religious Services: Never    Database administrator or Organizations: No    Attends Banker Meetings: Never    Marital Status: Living with partner   Past Surgical History:  Procedure  Laterality Date   ATRIAL FIBRILLATION ABLATION N/A 09/05/2022   Procedure: ATRIAL FIBRILLATION ABLATION;  Surgeon: Inocencio Soyla Lunger, MD;  Location: [facility];  Service: Cardiovascular;  Laterality: N/A;   ATRIAL FIBRILLATION ABLATION N/A 07/24/2023   Procedure: ATRIAL FIBRILLATION ABLATION;  Surgeon: Inocencio Soyla Lunger, MD;  Location: [facility];  Service: Cardiovascular;  Laterality: N/A;   CARDIAC CATHETERIZATION     CARDIAC VALVE REPLACEMENT     CARDIOVERSION N/A 04/02/2022   Procedure: CARDIOVERSION;  Surgeon: Elmira Newman PARAS, MD;  Location: [facility];  Service: Cardiovascular;  Laterality: N/A;   CARDIOVERSION N/A 04/27/2022   Procedure: CARDIOVERSION;  Surgeon: Elmira Newman PARAS, MD;  Location: [facility];  Service: Cardiovascular;  Laterality: N/A;   CARDIOVERSION N/A 03/27/2023   Procedure: CARDIOVERSION;  Surgeon: Alvan Ronal BRAVO, MD;  Location: [facility];  Service: Cardiovascular;  Laterality: N/A;   CARDIOVERSION N/A 05/06/2023   Procedure: CARDIOVERSION;  Surgeon: Sheena Pugh, DO;  Location: [facility];  Service: Cardiovascular;  Laterality: N/A;   CARDIOVERSION N/A 06/02/2023   Procedure: CARDIOVERSION;  Surgeon: Jeffrie Oneil BROCKS, MD;  Location: [facility];  Service: Cardiovascular;  Laterality: N/A;   CHOLECYSTECTOMY     CORONARY ARTERY BYPASS GRAFT     CORONARY/GRAFT ANGIOGRAPHY N/A 08/18/2018   Procedure: CORONARY/GRAFT ANGIOGRAPHY;  Surgeon: Elmira Newman PARAS, MD;  Location: [facility];  Service: Cardiovascular;  Laterality: N/A;   ELECTROPHYSIOLOGY STUDY N/A 02/04/2023   Procedure: ELECTROPHYSIOLOGY STUDY;  Surgeon: Inocencio Soyla Lunger, MD;  Location: [facility];  Service: Cardiovascular;  Laterality: N/A;   ICD IMPLANT N/A 02/10/2023   Procedure: ICD IMPLANT;  Surgeon: Inocencio Soyla Lunger, MD;  Location: [facility];  Service: Cardiovascular;  Laterality: N/A;   LEG SURGERY     metal plate in leg   RIGHT HEART CATH AND CORONARY/GRAFT ANGIOGRAPHY N/A 07/21/2018   Procedure: RIGHT HEART CATH AND CORONARY/GRAFT  ANGIOGRAPHY;  Surgeon: Elmira Newman PARAS, MD;  [facility]: [facility];  Service: Cardiovascular;  Laterality: N/A;   TEE WITHOUT CARDIOVERSION N/A 07/27/2015   Procedure: TRANSESOPHAGEAL ECHOCARDIOGRAM (TEE);  Surgeon: Jerel Balding, MD;  Location: [facility] ENDOSCOPY;  Service: Cardiovascular;  Laterality: N/A;   TEE WITHOUT CARDIOVERSION N/A 07/21/2018   Procedure: TRANSESOPHAGEAL ECHOCARDIOGRAM (TEE);  Surgeon: Elmira Newman PARAS, MD;  Location: [facility] ENDOSCOPY;  Service: Cardiovascular;  Laterality: N/A;   TONSILLECTOMY AND ADENOIDECTOMY     TRANSESOPHAGEAL ECHOCARDIOGRAM (CATH LAB) N/A 05/06/2023   Procedure: TRANSESOPHAGEAL ECHOCARDIOGRAM;  Surgeon: Sheena Pugh, DO;  [facility]: [facility];  Service: Cardiovascular;  Laterality: N/A;   TRANSESOPHAGEAL ECHOCARDIOGRAM (CATH LAB) N/A 06/02/2023   Procedure: TRANSESOPHAGEAL ECHOCARDIOGRAM;  Surgeon: Jeffrie Oneil BROCKS, MD;  [facility]: [facility];  Service: Cardiovascular;  Laterality: N/A;   Past Surgical History:  Procedure Laterality Date   ATRIAL FIBRILLATION ABLATION N/A 09/05/2022   Procedure: ATRIAL FIBRILLATION ABLATION;  Surgeon: Inocencio Soyla Lunger, MD;  [facility]: [facility];  Service: Cardiovascular;  Laterality: N/A;   ATRIAL FIBRILLATION ABLATION N/A 07/24/2023   Procedure: ATRIAL FIBRILLATION ABLATION;  Surgeon: Inocencio Soyla Lunger, MD;  [facility];  Service: Cardiovascular;  Laterality: N/A;   CARDIAC CATHETERIZATION     CARDIAC VALVE REPLACEMENT     CARDIOVERSION N/A 04/02/2022   Procedure: CARDIOVERSION;  Surgeon: Elmira Newman PARAS, MD;  [facility];  Service: Cardiovascular;  Laterality: N/A;   CARDIOVERSION N/A 04/27/2022   Procedure:  CARDIOVERSION;  Surgeon: Elmira Newman PARAS, MD;  Location[facility] ENDOSCOPY;  Service: Cardiovascular;  Laterality: N/A;   CARDIOVERSION N/A 03/27/2023   Procedure: CARDIOVERSION;  Surgeon: Alvan Ronal BRAVO, MD;  [facility]: [facility];   Service: Cardiovascular;  Laterality: N/A;   CARDIOVERSION N/A 05/06/2023   Procedure: CARDIOVERSION;  Surgeon: Sheena Pugh, DO;  [facility]: [facility];  Service: Cardiovascular;  Laterality: N/A;   CARDIOVERSION N/A 06/02/2023   Procedure: CARDIOVERSION;  Surgeon: Jeffrie Oneil BROCKS, MD;  [facility]: [facility];  Service: Cardiovascular;  Laterality: N/A;   CHOLECYSTECTOMY     CORONARY ARTERY BYPASS GRAFT     CORONARY/GRAFT ANGIOGRAPHY N/A 08/18/2018   Procedure: CORONARY/GRAFT ANGIOGRAPHY;  Surgeon: Elmira Newman PARAS, MD;  [facility]: [facility];  Service: Cardiovascular;  Laterality: N/A;   ELECTROPHYSIOLOGY STUDY N/A 02/04/2023   Procedure: ELECTROPHYSIOLOGY STUDY;  Surgeon: Inocencio Soyla Lunger, MD;  [facility]: [facility];  Service: Cardiovascular;  Laterality: N/A;   ICD IMPLANT N/A 02/10/2023   Procedure: ICD IMPLANT;  Surgeon: Inocencio Soyla Lunger, MD;  Location[facility] INVASIVE CV LAB;  Service: Cardiovascular;  Laterality: N/A;   LEG SURGERY     metal plate in leg   RIGHT HEART CATH AND CORONARY/GRAFT ANGIOGRAPHY N/A 07/21/2018   Procedure: RIGHT HEART CATH AND CORONARY/GRAFT ANGIOGRAPHY;  Surgeon: Elmira Newman PARAS, MD;  [facility]: [facility];  Service: Cardiovascular;  Laterality: N/A;   TEE WITHOUT CARDIOVERSION N/A 07/27/2015   Procedure: TRANSESOPHAGEAL ECHOCARDIOGRAM (TEE);  Surgeon: Jerel Balding, MD;  Location: [facility] ENDOSCOPY;  Service: Cardiovascular;  Laterality: N/A;   TEE WITHOUT CARDIOVERSION N/A 07/21/2018   Procedure: TRANSESOPHAGEAL ECHOCARDIOGRAM (TEE);  Surgeon: Elmira Newman PARAS, MD;  Location: [facility] ENDOSCOPY;  Service: Cardiovascular;  Laterality: N/A;   TONSILLECTOMY AND ADENOIDECTOMY     TRANSESOPHAGEAL ECHOCARDIOGRAM (CATH LAB) N/A 05/06/2023   Procedure: TRANSESOPHAGEAL ECHOCARDIOGRAM;  Surgeon: Sheena Pugh, DO;  Location: [facility];  Service: Cardiovascular;  Laterality: N/A;   TRANSESOPHAGEAL ECHOCARDIOGRAM (CATH  LAB) N/A 06/02/2023   Procedure: TRANSESOPHAGEAL ECHOCARDIOGRAM;  Surgeon: Jeffrie Oneil BROCKS, MD;  Location: [facility];  Service: Cardiovascular;  Laterality: N/A;   Past Medical History:  Diagnosis Date   Allergy  ?   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 01/01/2017 01/01/2017   Ht 5' 5 (1.651 m)   Wt 208 lb (94.3 kg)   LMP 06/20/2015   BMI 34.61 kg/m   Opioid Risk Score:   Fall Risk Score:  `1  Depression screen [place] 2/9     07/31/2023    9:03 AM 04/14/2023    1:38 PM 01/07/2023    9:27 AM 12/23/2022   10:25 AM 10/14/2022   10:49 AM 08/23/2022   10:32 AM 07/08/2022    9:52 AM  Depression screen PHQ 2/9  Decreased Interest   1 0 0 1 0  Down, Depressed, Hopeless  1 0 1 1 0  PHQ - 2 Score   2 0 1 2 0  Altered sleeping    3 0    Tired, decreased energy    1 1    Change in appetite    3 0    Feeling bad or failure about yourself     0 0    Trouble concentrating    0 0    Moving slowly or fidgety/restless    0 0    Suicidal thoughts    0 0    PHQ-9 Score    7 2    Difficult doing work/chores    Not difficult at all Somewhat difficult       Information is confidential and restricted. Go to Review Flowsheets to unlock data.     Review of Systems  Musculoskeletal:  Positive for back pain.  All other systems reviewed and are negative.      Objective:   Physical Exam  Gen: no distress, normal appearing HEENT: oral mucosa pink and moist,  NCAT Cardio: Reg rate Chest: normal effort, normal rate of breathing Abd: soft, non-distended Ext: no edema Psych: pleasant, normal affect Skin: intact Neuro: Alert and awake, follows commands, cranial nerves II through XII intact, intact insight and judgment, speech and language intact Strength 5 out of 5 in all 4 extremities Sensation intact light touch in all 4 extremities No ankle clonus Musculoskeletal:  Tenderness to b/l L spine  SLR negative Back pain is worsened with spinal extension    Prior exam:  Mild tenderness noted over the right SI joint   MRI L-spine 1/17/2024IMPRESSION: 1. L4-L5 moderate facet arthropathy, which can be a cause of back pain. Narrowing of the lateral recesses at this level could affect the descending L5 nerve roots. 2. No spinal canal stenosis or neural foraminal narrowing.       Assessment & Plan:   Chronic lower back pain bilateral but worse on the right without radiculopathy -No improvement with Tylenol , unable to take NSAIDs due to cardiac history - Intermittent right SI joint tenderness has been noted, continue to monitor -Pain is primarily axial in her lower back, suspect this may be facet joint mediated -ORT low -TENS unit, nexwave device ordered prior visit -Discontinued Norco 7.5 twice daily prior visit -Cymbalta  20 mg daily- She did not tolerate -She reports Physical Therapy provided minimal improvement -Pain agreement completed prior visit -Discussed facet joint injections B/L L3-4, L4-5, R L5-S1-I think this would be a good option.  She is currently on anticoagulation but could be an option if she can come off of this for short time with the procedures.  -Continue UDS, Pill Counts, PDMP monitoring  -She is on gabapentin  for restless leg. Increase to 200mg  HS. -Pill counts consistent today - Continue Percocet to 7.5 mg twice daily , pt will try during the day - Butrans  added to allergy  list, caused rash   Chronic anxiety,  denies SI or HI -No longer on Celexa , Cymbalta - she says it made her feel bad -Continue follow-up with mental health as directed  Prior warnings -2nd warning today forgot pills for count 10/03/23 -She forgot pills prior visit- and had used codeine  cough syrup and  warning was given at the time

## 2023-10-03 NOTE — Progress Notes (Signed)
 Subjective:    Patient ID: Kimberly Hobbs, female    DOB: [birth year], 63 y.o.   MRN: [id number]  HPI   Pain Inventory Average Pain 10 Pain Right Now 5 My pain is intermittent, sharp, dull, stabbing, and aching  In the last 24 hours, has pain interfered with the following? General activity 10 Relation with others 0 Enjoyment of life 5 What TIME of day is your pain at its worst? evening and night Sleep (in general) Fair  Pain is worse with: walking and standing Pain improves with: rest and medication Relief from Meds: 0  Family History  Problem Relation Age of Onset  . Drug abuse Mother   . Alcohol abuse Mother   . Anxiety disorder Mother   . Cancer Mother   . Hypertension Mother   . Diabetes Mother   . Hyperlipidemia Mother   . Heart disease Mother   . Drug abuse Father   . Alcohol abuse Father   . Anxiety disorder Father   . Cancer Father   . Hypertension Father   . Diabetes Father   . Heart disease Father   . Hypertension Sister   . Hypertension Brother   . Hypertension Brother   . Schizophrenia Maternal Grandmother   . Cancer Daughter   . COPD Daughter   . Healthy Child   . Healthy Child   . Healthy Child   . Healthy Child   . Breast cancer Neg Hx    Social History   Socioeconomic History  . Marital status: Divorced    Spouse name: Not on file  . Number of children: 4  . Years of education: Not on file  . Highest education level: GED or equivalent  Occupational History  . 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 Smoking status: Former    Current packs/day: 0.00    Average packs/day: 1.6 packs/day for 50.0 years (80.0 ttl pk-yrs)    Types: Cigarettes    Start date: 12/23/1976    Quit date: 12/23/2016    Years since quitting: 6.7    Passive exposure: Past  . Smokeless tobacco: Never  . Tobacco comments:    Former smoker 01/24/23  Vaping Use  . Vaping status: Never Used  Substance and  Sexual Activity  . Alcohol use: Never  . Drug use: Never  . Sexual activity: Not Currently  Other Topics Concern  . Not on file  Social History Narrative   ** Merged History Encounter ** Epworth Sleepiness Scale = 4 (as of 06/28/2015)   Right handed    Social Drivers of Health   Financial Resource Strain: Low Risk  (10/14/2022)   Overall Financial Resource Strain (CARDIA)   . Difficulty of Paying Living Expenses: Not hard at all  Food Insecurity: No Food Insecurity (05/12/2023)   Hunger Vital Sign   . Worried About Programme researcher, broadcasting/film/video in the Last Year: Never true   . Ran Out of Food in the Last Year: Never true  Transportation Needs: No Transportation Needs (05/12/2023)   PRAPARE - Transportation   . Lack of Transportation (Medical): No   . Lack of Transportation (Non-Medical): No  Physical Activity: Inactive (10/14/2022)   Exercise Vital Sign   . Days of Exercise per Week: 0 days   . Minutes of Exercise per Session: 0 min  Stress: Stress Concern Present (10/14/2022)   [employer] of Occupational Health - Occupational Stress Questionnaire   . Feeling  of Stress : To some extent  Social Connections: Moderately Isolated (10/14/2022)   Social Connection and Isolation Panel   . Frequency of Communication with Friends and Family: More than three times a week   . Frequency of Social Gatherings with Friends and Family: Never   . Attends Religious Services: Never   . Active Member of Clubs or Organizations: No   . Attends Banker Meetings: Never   . Marital Status: Living with partner   Past Surgical History:  Procedure Laterality Date  . ATRIAL FIBRILLATION ABLATION N/A 09/05/2022   Procedure: ATRIAL FIBRILLATION ABLATION;  Surgeon: Inocencio Soyla Lunger, MD;  Location: [facility];  Service: Cardiovascular;  Laterality: N/A;  . ATRIAL FIBRILLATION ABLATION N/A 07/24/2023   Procedure: ATRIAL FIBRILLATION ABLATION;  Surgeon: Inocencio Soyla Lunger, MD;  [facility]: [facility];  Service: Cardiovascular;  Laterality: N/A;  . CARDIAC CATHETERIZATION    . CARDIAC VALVE REPLACEMENT    . CARDIOVERSION N/A 04/02/2022   Procedure: CARDIOVERSION;  Surgeon: Elmira Newman PARAS, MD;  Location: [facility];  Service: Cardiovascular;  Laterality: N/A;  . CARDIOVERSION N/A 04/27/2022   Procedure: CARDIOVERSION;  Surgeon: Elmira Newman PARAS, MD;  Location: [facility];  Service: Cardiovascular;  Laterality: N/A;  . CARDIOVERSION N/A 03/27/2023   Procedure: CARDIOVERSION;  Surgeon: Alvan Ronal BRAVO, MD;  [facility]: [facility];  Service: Cardiovascular;  Laterality: N/A;  . CARDIOVERSION N/A 05/06/2023   Procedure: CARDIOVERSION;  Surgeon: Sheena Pugh, DO;  [facility];  Service: Cardiovascular;  Laterality: N/A;  . CARDIOVERSION N/A 06/02/2023   Procedure: CARDIOVERSION;  Surgeon: Jeffrie Oneil BROCKS, MD;  [facility]: [facility];  Service: Cardiovascular;  Laterality: N/A;  . CHOLECYSTECTOMY    . CORONARY ARTERY BYPASS GRAFT    . CORONARY/GRAFT ANGIOGRAPHY N/A 08/18/2018   Procedure: CORONARY/GRAFT ANGIOGRAPHY;  Surgeon: Elmira Newman PARAS, MD;  [facility];  Service: Cardiovascular;  Laterality: N/A;  . ELECTROPHYSIOLOGY STUDY N/A 02/04/2023   Procedure: ELECTROPHYSIOLOGY STUDY;  Surgeon: Inocencio Soyla Lunger, MD;  [facility];  Service: Cardiovascular;  Laterality: N/A;  . ICD IMPLANT N/A 02/10/2023   Procedure: ICD IMPLANT;  Surgeon: Inocencio Soyla Lunger, MD;  [facility];  Service: Cardiovascular;  Laterality: N/A;  . LEG SURGERY     metal plate in leg  . RIGHT HEART CATH AND CORONARY/GRAFT ANGIOGRAPHY N/A 07/21/2018   Procedure: RIGHT HEART CATH AND CORONARY/GRAFT ANGIOGRAPHY;  Surgeon: Elmira Newman PARAS, MD;  [facility];  Service: Cardiovascular;  Laterality: N/A;  . TEE WITHOUT CARDIOVERSION N/A 07/27/2015   Procedure: TRANSESOPHAGEAL ECHOCARDIOGRAM (TEE);   Surgeon: Jerel Balding, MD;  Location: [facility] ENDOSCOPY;  Service: Cardiovascular;  Laterality: N/A;  . TEE WITHOUT CARDIOVERSION N/A 07/21/2018   Procedure: TRANSESOPHAGEAL ECHOCARDIOGRAM (TEE);  Surgeon: Elmira Newman PARAS, MD;  Location: [facility] ENDOSCOPY;  Service: Cardiovascular;  Laterality: N/A;  . TONSILLECTOMY AND ADENOIDECTOMY    . TRANSESOPHAGEAL ECHOCARDIOGRAM (CATH LAB) N/A 05/06/2023   Procedure: TRANSESOPHAGEAL ECHOCARDIOGRAM;  Surgeon: Sheena Pugh, DO;  [facility];  Service: Cardiovascular;  Laterality: N/A;  . TRANSESOPHAGEAL ECHOCARDIOGRAM (CATH LAB) N/A 06/02/2023   Procedure: TRANSESOPHAGEAL ECHOCARDIOGRAM;  Surgeon: Jeffrie Oneil BROCKS, MD;  [facility];  Service: Cardiovascular;  Laterality: N/A;   Past Surgical History:  Procedure Laterality Date  . ATRIAL FIBRILLATION ABLATION N/A 09/05/2022   Procedure: ATRIAL FIBRILLATION ABLATION;  Surgeon: Inocencio Soyla Lunger, MD;  [facility];  Service: Cardiovascular;  Laterality: N/A;  . ATRIAL FIBRILLATION ABLATION N/A 07/24/2023   Procedure: ATRIAL FIBRILLATION ABLATION;  Surgeon: Inocencio Soyla Lunger, MD;  [facility];  Service: Cardiovascular;  Laterality: N/A;  . CARDIAC CATHETERIZATION    . CARDIAC VALVE REPLACEMENT    . CARDIOVERSION N/A 04/02/2022   Procedure: CARDIOVERSION;  Surgeon: Elmira Newman PARAS, MD;  [facility];  Service: Cardiovascular;  Laterality: N/A;  . CARDIOVERSION N/A 04/27/2022   Procedure: CARDIOVERSION;  Surgeon: Elmira Newman PARAS, MD;  [facility];  Service: Cardiovascular;  Laterality: N/A;  . CARDIOVERSION N/A 03/27/2023   Procedure: CARDIOVERSION;  Surgeon: Alvan Ronal BRAVO, MD;  [facility];  Service: Cardiovascular;  Laterality: N/A;  . CARDIOVERSION N/A 05/06/2023   Procedure: CARDIOVERSION;  Surgeon: Sheena Pugh, DO;  [facility];  Service: Cardiovascular;  Laterality: N/A;  .  CARDIOVERSION N/A 06/02/2023   Procedure: CARDIOVERSION;  Surgeon: Jeffrie Oneil BROCKS, MD;  [facility];  Service: Cardiovascular;  Laterality: N/A;  . CHOLECYSTECTOMY    . CORONARY ARTERY BYPASS GRAFT    . CORONARY/GRAFT ANGIOGRAPHY N/A 08/18/2018   Procedure: CORONARY/GRAFT ANGIOGRAPHY;  Surgeon: Elmira Newman PARAS, MD;  [facility];  Service: Cardiovascular;  Laterality: N/A;  . ELECTROPHYSIOLOGY STUDY N/A 02/04/2023   Procedure: ELECTROPHYSIOLOGY STUDY;  Surgeon: Inocencio Soyla Lunger, MD;  [facility];  Service: Cardiovascular;  Laterality: N/A;  . ICD IMPLANT N/A 02/10/2023   Procedure: ICD IMPLANT;  Surgeon: Inocencio Soyla Lunger, MD;  [facility];  Service: Cardiovascular;  Laterality: N/A;  . LEG SURGERY     metal plate in leg  . RIGHT HEART CATH AND CORONARY/GRAFT ANGIOGRAPHY N/A 07/21/2018   Procedure: RIGHT HEART CATH AND CORONARY/GRAFT ANGIOGRAPHY;  Surgeon: Elmira Newman PARAS, MD;  [facility];  Service: Cardiovascular;  Laterality: N/A;  . TEE WITHOUT CARDIOVERSION N/A 07/27/2015   Procedure: TRANSESOPHAGEAL ECHOCARDIOGRAM (TEE);  Surgeon: Jerel Balding, MD;  Location: [facility] ENDOSCOPY;  Service: Cardiovascular;  Laterality: N/A;  . TEE WITHOUT CARDIOVERSION N/A 07/21/2018   Procedure: TRANSESOPHAGEAL ECHOCARDIOGRAM (TEE);  Surgeon: Elmira Newman PARAS, MD;  Location: [facility] ENDOSCOPY;  Service: Cardiovascular;  Laterality: N/A;  . TONSILLECTOMY AND ADENOIDECTOMY    . TRANSESOPHAGEAL ECHOCARDIOGRAM (CATH LAB) N/A 05/06/2023   Procedure: TRANSESOPHAGEAL ECHOCARDIOGRAM;  Surgeon: Sheena Pugh, DO;  [facility];  Service: Cardiovascular;  Laterality: N/A;  . TRANSESOPHAGEAL ECHOCARDIOGRAM (CATH LAB) N/A 06/02/2023   Procedure: TRANSESOPHAGEAL ECHOCARDIOGRAM;  Surgeon: Jeffrie Oneil BROCKS, MD;  [facility];  Service: Cardiovascular;  Laterality: N/A;   Past Medical History:   Diagnosis Date  . Allergy  ?  . Arthritis    Back  . CHF (congestive heart failure) (HCC)   . 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 COPD (chronic obstructive pulmonary disease) (HCC)   . COVID-19   . GERD (gastroesophageal reflux disease)   . H/O mitral valve replacement with mechanical #25 mechanical SJM 01/01/2017) 01/01/2017  . Hyperlipidemia   . Hypertension   . Pain in both lower extremities 07/23/2019  . Paroxysmal atrial fibrillation (HCC) 06/29/2015  . Rheumatic heart disease    MS/ AS  . Sleep apnea   . 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 01/01/2017 01/01/2017   LMP 06/20/2015   Opioid Risk Score:   Fall Risk Score:  `  1  Depression screen [facility] 2/9     07/31/2023    9:03 AM 04/14/2023    1:38 PM 01/07/2023    9:27 AM 12/23/2022   10:25 AM 10/14/2022   10:49 AM 08/23/2022   10:32 AM 07/08/2022    9:52 AM  Depression screen PHQ 2/9  Decreased Interest   1 0 0 1 0  Down, Depressed, Hopeless   1 0 1 1 0  PHQ - 2 Score   2 0 1 2 0  Altered sleeping    3 0    Tired, decreased energy    1 1    Change in appetite    3 0    Feeling bad or failure about yourself     0 0    Trouble concentrating    0 0    Moving slowly or fidgety/restless    0 0    Suicidal thoughts    0 0    PHQ-9 Score    7 2    Difficult doing work/chores    Not difficult at all Somewhat difficult       Information is confidential and restricted. Go to Review Flowsheets to unlock data.    Review of Systems  Musculoskeletal:  Positive for back pain.  All other systems reviewed and are negative.      Objective:   Physical Exam        Assessment & Plan:

## 2023-10-07 ENCOUNTER — Ambulatory Visit: Attending: Cardiology

## 2023-10-07 DIAGNOSIS — I480 Paroxysmal atrial fibrillation: Secondary | ICD-10-CM | POA: Diagnosis not present

## 2023-10-07 DIAGNOSIS — Z5181 Encounter for therapeutic drug level monitoring: Secondary | ICD-10-CM | POA: Diagnosis not present

## 2023-10-07 LAB — POCT INR: INR: 3.8 — AB (ref 2.0–3.0)

## 2023-10-07 NOTE — Progress Notes (Signed)
 INR 3.8 Please see anticoagulation encounter

## 2023-10-07 NOTE — Patient Instructions (Signed)
 Description   Eat greens tonight and START taking warfarin 1 tablet (5mg ) daily EXCEPT 1/2 tablet on Sundays.  Recheck INR in 4 weeks  Anticoagulation Clinic [phone]  [facility] Clearance request Fax #[phone] *Amiodarone  400mg  BID*

## 2023-10-08 ENCOUNTER — Telehealth: Payer: Self-pay

## 2023-10-08 NOTE — Telephone Encounter (Signed)
 There should be an order at the pharmacy. Please contact them to confirm, and kindly update the patient once verified.

## 2023-10-08 NOTE — Telephone Encounter (Signed)
 pt was notified that rx had been placed on hold and that i asked the phram. to get rx ready. advised pt to call pharmacy later today to see if rx is ready for pick upl.

## 2023-10-08 NOTE — Telephone Encounter (Signed)
 clonazepam  was on hold. requested that they process rx.  they will have ready later today.

## 2023-10-09 ENCOUNTER — Ambulatory Visit (INDEPENDENT_AMBULATORY_CARE_PROVIDER_SITE_OTHER): Payer: Self-pay | Admitting: Licensed Clinical Social Worker

## 2023-10-09 DIAGNOSIS — Z91199 Patient's noncompliance with other medical treatment and regimen due to unspecified reason: Secondary | ICD-10-CM

## 2023-10-09 NOTE — Progress Notes (Signed)
 Clinician attempted session via face-to-face, but Kimberly Hobbs did not appear for her session.  Per [employer], s/he will be charged a no-show fee. show

## 2023-10-16 ENCOUNTER — Encounter: Payer: Self-pay | Admitting: Cardiology

## 2023-10-16 ENCOUNTER — Other Ambulatory Visit: Payer: Self-pay | Admitting: *Deleted

## 2023-10-16 MED ORDER — WARFARIN SODIUM 5 MG PO TABS: ORAL_TABLET | ORAL | 1 refills | Status: DC

## 2023-10-16 NOTE — Telephone Encounter (Signed)
 Prescription refill request received for warfarin Lov:  afib 08/25/2023 Next INR check: 11/04/2023 Warfarin tablet strength: 5mg 

## 2023-10-23 ENCOUNTER — Ambulatory Visit: Admitting: Licensed Clinical Social Worker

## 2023-10-23 DIAGNOSIS — F4310 Post-traumatic stress disorder, unspecified: Secondary | ICD-10-CM

## 2023-10-23 DIAGNOSIS — F331 Major depressive disorder, recurrent, moderate: Secondary | ICD-10-CM

## 2023-10-23 DIAGNOSIS — F411 Generalized anxiety disorder: Secondary | ICD-10-CM

## 2023-10-23 NOTE — Progress Notes (Signed)
 THERAPIST PROGRESS NOTE  Session Time: 10:01-11:01am  Participation Level: Active  Behavioral Response: CasualAlertEuthymic  Type of Therapy: Individual Therapy  Treatment Goals addressed:  Active     BH CCP Acute or Chronic Trauma Reaction     LTG: Recall traumatic events without becoming overwhelmed with negative emotions     Start:  07/31/23    Expected End:  12/20/23         STG: I want to be able to say what I want to say.     Start:  07/31/23    Expected End:  12/20/23         STG: Saanya will identify coping strategies to deal with trauma memories and the associated emotional reaction     Start:  07/31/23    Expected End:  12/20/23         Cooperate with trauma-focused psychotherapy techniques to reduce emotional reaction to the traumatic event      Start:  07/31/23         Work with Dorothe to construct a list of the situations, people, & places that [place] evoke the most distressing symptoms; suggest that they keep a journal of instances of stress being triggered     Start:  07/31/23         Teach Dorothe coping strategies (e.g., writing down thoughts and feelings in a journal; taking deep, slow breaths; calling a support person to talk about memories) to deal with trauma memories and sudden emotional reactions without becoming emotionally nu     Start:  07/31/23         Provide Dorothe education on communication patterns in relationships     Start:  07/31/23            ProgressTowards Goals: Progressing  Interventions: CBT, Assertiveness Training, Supportive, and Reframing  Summary: Kimberly Hobbs is a 63 y.o. female who presents with little interest, low mood, fatigue, difficulties with sleep, changes to her appetite, falling anxious, uncontrollable worry, restlessness, flashbacks and hypervigilance. Pt was oriented times 5. Pt was cooperative and engaged. Pt denies SI/HI/AVH.    The patient reflected on feelings of grief related to her  dog's passing, which occurred the day before her appointment. She spent most of the session discussing stressors in her relationships with her children, expressing that she feels unable to voice her thoughts and has often felt isolated in her parenting experience. The patient reported that her coping strategies in the past included applying misplaced guilt and shame to herself in an attempt to understand the behaviors of others.   The clinician worked with the patient to reframe this perspective, focusing on her role in breaking generational trauma. Together, they identified positive green flag behaviors and moved toward acceptance, recognizing that she cannot control or change other people. The clinician also engaged the patient in brief work with her inner child to explore ways in which she can shift her priorities, stepping outside her current roles to focus on caring for herself and her inner child.  Although the patient was tearful during the session, she provided feedback indicating that she feels she is gaining a better understanding of the boundaries she needs to set.  For homework, patient determined a goal to construct a letter to her son to which afterwards she will determine if she feels comfortable sending to her son.  For neck session, patient and clinician will explore attachment styles and discussed which she feels best describes her behaviors.  Suicidal/Homicidal: Nowithout  intent/plan  Therapist Response: Clinician utilized active and supportive reflection to create a safe space for patient to process recent life events.  Clinician assessed for current symptoms, safety, stressors since last session. Explored ways in which patient can begin to challenge manage placed blame and shame by establishing healthier boundaries within her relationships.  Reflected on controllable factors patient can focus on.  Reviewed ways in which patient can utilize assertive communication within her  personal relationships.  Plan: Return again in 2 weeks.  Diagnosis: PTSD (post-traumatic stress disorder)  MDD (major depressive disorder), recurrent episode, moderate (HCC)  Anxiety state   Collaboration of Care: AEB psychiatrist can access notes and cln. Will review psychiatrists' notes. Check in with the patient and will see LCSW per availability. Patient agreed with treatment recommendations.   Patient/Guardian was advised Release of Information must be obtained prior to any record release in order to collaborate their care with an outside provider. Patient/Guardian was advised if they have not already done so to contact the registration department to sign all necessary forms in order for us  to release information regarding their care.   Consent: Patient/Guardian gives verbal consent for treatment and assignment of benefits for services provided during this visit. Patient/Guardian expressed understanding and agreed to proceed.   Evalene KATHEE Husband, LCSW 10/23/2023

## 2023-10-24 ENCOUNTER — Ambulatory Visit (HOSPITAL_COMMUNITY): Admitting: Physician Assistant

## 2023-10-24 DIAGNOSIS — Z7901 Long term (current) use of anticoagulants: Secondary | ICD-10-CM | POA: Diagnosis not present

## 2023-10-24 DIAGNOSIS — Z952 Presence of prosthetic heart valve: Secondary | ICD-10-CM | POA: Diagnosis not present

## 2023-10-24 DIAGNOSIS — I480 Paroxysmal atrial fibrillation: Secondary | ICD-10-CM | POA: Diagnosis not present

## 2023-10-25 DIAGNOSIS — G4733 Obstructive sleep apnea (adult) (pediatric): Secondary | ICD-10-CM | POA: Diagnosis not present

## 2023-10-28 ENCOUNTER — Encounter: Payer: Self-pay | Admitting: Physical Medicine & Rehabilitation

## 2023-10-29 ENCOUNTER — Other Ambulatory Visit: Payer: Self-pay | Admitting: Psychiatry

## 2023-10-31 ENCOUNTER — Encounter: Attending: Physical Medicine & Rehabilitation | Admitting: Physical Medicine & Rehabilitation

## 2023-10-31 ENCOUNTER — Encounter: Payer: Self-pay | Admitting: Physical Medicine & Rehabilitation

## 2023-10-31 ENCOUNTER — Telehealth: Payer: Self-pay

## 2023-10-31 VITALS — BP 111/70 | HR 76 | Ht 65.0 in | Wt 205.0 lb

## 2023-10-31 DIAGNOSIS — G894 Chronic pain syndrome: Secondary | ICD-10-CM | POA: Insufficient documentation

## 2023-10-31 DIAGNOSIS — M545 Low back pain, unspecified: Secondary | ICD-10-CM | POA: Insufficient documentation

## 2023-10-31 DIAGNOSIS — G8929 Other chronic pain: Secondary | ICD-10-CM | POA: Diagnosis not present

## 2023-10-31 DIAGNOSIS — Z79891 Long term (current) use of opiate analgesic: Secondary | ICD-10-CM | POA: Diagnosis not present

## 2023-10-31 DIAGNOSIS — G2581 Restless legs syndrome: Secondary | ICD-10-CM | POA: Diagnosis not present

## 2023-10-31 DIAGNOSIS — Z5181 Encounter for therapeutic drug level monitoring: Secondary | ICD-10-CM | POA: Diagnosis not present

## 2023-10-31 MED ORDER — OXYCODONE-ACETAMINOPHEN 7.5-325 MG PO TABS: 1.0000 | ORAL_TABLET | Freq: Two times a day (BID) | ORAL | 0 refills | Status: DC | PRN
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
## 2023-10-31 NOTE — Telephone Encounter (Signed)
 The Pre procedure form has been  reviewed over the phone. A copy of the form has been mailed. Patient is scheduled for Medial Branch Block   12/09/2023. She  will continue the Warfarin prior to her MMB.

## 2023-10-31 NOTE — Progress Notes (Addendum)
 Subjective:    Patient ID: Kimberly Hobbs, female    DOB: [birth year], 63 y.o.   MRN: [id number]  HPI  Kimberly Hobbs is a 63 y.o. year old female  who  has a past medical history of CKD stage 3 due to type 2 diabetes mellitus (HCC) (05/15/2021), COVID-19, Hyperlipidemia, Hypertension, Pain in both lower extremities (07/23/2019), and Rheumatic heart disease.   They are presenting to PM&R clinic as a new patient for pain management evaluation. They were referred by Kimberly Hobbs for treatment of low back pain.  She cant stand for long. Has to use a walker.  Pain is severe if she walks without this. Pain started about 2 years ago but is worsening. 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  Pain is mostly on the right side of her lower back. Sometimes both sides. It doesn't shoot down her legs.  Lying down helps the pain.  She reports her activities are very limited by her pain.  She has been taking hydrocodone  5/325 1 pain is very severe. History of prosthetic valves, schedule for ablation for A-fib       Red flag symptoms: No red flags for back pain endorsed in Hx or ROS   Medications tried: Topical medications - doesn't recall name, didn't help Nsaids- cannot take for cardiac reasons Tylenol - doesn't help Opiates  Hydrocodone  -she uses sparingly when pain is very severe, this does help control her pain Gabapentin  / Lyrica  Has not tried  Tramadol -does not remember TCAs  - Denies  She is currently on Celexa  for her anxiety, this medication visit has been working very well for her Robaxin  helps pain   Other treatments: PT/OT  has not tried, in afib waiting on heart ablation  TENs unit - has not tried  Injections-denies Surgery - denies      Interal History 08/23/22   Kimberly Hobbs is here for follow-up regarding her chronic pain.  She reports she continues to have right-sided back pain.  She also has some aching pain in her  legs bilaterally.  She reports she has her cardiac ablation procedures 09/05/2022 so after this time she would be interested in getting facet joint injections.  Pain is worsened with standing and she continues to use her walker for ambulation.  Pain continues to be improved with lying down.  Vicodin 7.5 is helping her control this pain.  She tried to use this sparingly only when pain is very severe and 30 tablets has lasted her about a month and a half.  She is not having any side effects with this medication.   Interal History 10/29/2022 Kimberly Hobbs is here for follow-up regarding her right-sided back pain with aching pain in her legs.  She continues to use occasional Vicodin 7.5 which helps keep her pain controlled when it is very severe.  She tries to use this sparingly.  She has been started on gabapentin  100 mg at bedtime which has greatly reduced her bilateral leg pain.  She reports her Celexa  was discontinued because it was no longer helping.  She says she is currently not on any antidepressant medicines for anxiety.  She has had a few sessions of physical therapy but has not had significant benefits at this time. Patient has been using occasional Flexeril  10 mg with benefit, she does not use this when she takes for Norco.       Interal History 01/07/23 [name] is here for follow-up regarding her right-sided back pain and leg pain.  She continues to use Vicodin 7.5 mg sparingly when the pain is severe.  She still has most of her pills from her last refill.  Leg pain continues to be improved since starting gabapentin .  She stopped using Cymbalta , says she did not tolerate this and made her feel bad.  She reports pain is not significantly improved after physical therapy.     Interal History 03/19/22 Kimberly Hobbs reports that she has been having worse pain in her lower back more on the right side.  When she does activity she will have pain all the way across her back.  Hydrocodone  not helping pain is  much as it previously did.  Patient is currently following with cardiology working on treatment of her arrhythmia.  She is now restarted on amiodarone .  She had a dual-chamber ICD implanted 02/10/2023.  Interval history 04/17/2023 Kimberly Hobbs is here for follow-up regarding her back pain.  She reports her back pain is doing better, not having help her husband as much as he has been more active himself.  He does have another surgery scheduled.  She is only requiring use of oxycodone  sparingly and it is helping her pain.   Interval history 07/15/2023 Patient reports her last use of oxycodone  was yesterday.  She has been using oxycodone  sparingly, reports it decreases her pain but is not strong enough to fully control her back pain.  She is limited in household activities due to her back pain.  She is not able to do a lot of cooking or cleaning and has to rest a lot because of her pain.  Pain is present all the time but is worsened with activity.  Interval history 09/09/23 Patient is here for follow-up regarding her chronic pain.  She was not able to tolerate buprenorphine  due to body wide rash.  Oxycodone  is not helping as much as it was previously.  She typically takes this at night but sometimes takes it twice a day.  No side effects with the medication.  Her pain makes it difficult to do chores and other activities at home.   Interval history 10/03/23  Reports chronic lower back pain worsened for approximately 1 year. Pain worsened when lifting mother, which significantly aggravated symptoms. Pain primarily localized to lower back without radiation down legs. Pain worsens with activity and prolonged standing. Reports being in tears when getting up in morning and cooking breakfast. Pain improves when off feet or sitting with pillows for support.  Percocet - currently taking once nightly at bedtime, prescribed every 12 hours but patient only using at night. Notes not waking up in pain as much since  starting medication.  Pt has not used it in the day, so she is not sure if it will help her pain at this time.    Gabapentin  100mg  - taking once nightly about 1 hour before bed.  Initially helpful but now reports no benefit. Originally prescribed for restless leg syndrome. Restless leg syndrome - ongoing, keeps awake more than back pain. Reports pickle juice provides some relief.  Interval History 10/31/23 Reports continued chronic low back pain, described as unbearable. Pain is significantly worse with standing and walking, but is fine when lying down. Pain has become worse since the last MRI a year and a half ago. Unable to walk around stores like [employer] without a mobility cart. If no cart is available, has to leave the store. Unable to  stand in the kitchen to cook a meal. Pain is located across the low back, but is worse on the right side. Denies the pain shoots down the legs.  Current oxycodone  medication is taken at night and helps with sleep but provides limited daytime pain relief. She usually takes it at night only.   Discussed the possibility of a back injection. The patient understands they may need to coordinate with their cardiologist regarding stopping Coumadin , but it was noted that for this specific procedure, stopping the blood thinner may not be necessary. The patient is willing to proceed with bridging therapy (stomach injections) if required.  The patient inquired about obtaining a scooter for community mobility.    Pain Inventory Average Pain 10 Pain Right Now 9 My pain is sharp and stabbing, aching, dull  In the last 24 hours, has pain interfered with the following? General activity 10 Relation with others 0 Enjoyment of life 1 What TIME of day is your pain at its worst? Evening and night Sleep (in general) Fair  Pain is worse with: walking, bending standing Pain improves with: Medication Relief from Meds: 1  Family History  Problem Relation Age of Onset   Drug  abuse Mother    Alcohol abuse Mother    Anxiety disorder Mother    Cancer Mother    Hypertension Mother    Diabetes Mother    Hyperlipidemia Mother    Heart disease Mother    Drug abuse Father    Alcohol abuse Father    Anxiety disorder Father    Cancer Father    Hypertension Father    Diabetes Father    Heart disease Father    Hypertension Sister    Hypertension Brother    Hypertension Brother    Schizophrenia Maternal Grandmother    Cancer Daughter    COPD Daughter    Healthy Child    Healthy Child    Healthy Child    Healthy Child    Breast cancer Neg Hx    Social History   Socioeconomic History   Marital status: Divorced    Spouse name: Not on file   Number of children: 4   Years of education: Not on file   Highest education level: GED or equivalent  Occupational History   Occupation: unemployed    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  Tobacco Use   Smoking status: Former    Current packs/day: 0.00    Average packs/day: 1.6 packs/day for 50.0 years (80.0 ttl pk-yrs)    Types: Cigarettes    Start date: 12/23/1976    Quit date: 12/23/2016    Years since quitting: 6.8    Passive exposure: Past   Smokeless tobacco: Never   Tobacco comments:    Former smoker 01/24/23  Vaping Use   Vaping status: Never Used  Substance and Sexual Activity   Alcohol use: Never   Drug use: Never   Sexual activity: Not Currently  Other Topics Concern   Not on file  Social History Narrative   ** Merged History Encounter ** Epworth Sleepiness Scale = 4 (as of 06/28/2015)   Right handed    Social Drivers of Health   Financial Resource Strain: Low Risk  (10/14/2022)   Overall Financial Resource Strain (CARDIA)    Difficulty of Paying Living Expenses: Not hard at all  Food Insecurity: No Food Insecurity (05/12/2023)   Hunger Vital Sign    Worried About Running Out of Food in the Last Year: Never true  Ran Out of Food in the Last Year: Never true  Transportation  Needs: No Transportation Needs (05/12/2023)   PRAPARE - Administrator, Civil Service (Medical): No    Lack of Transportation (Non-Medical): No  Physical Activity: Inactive (10/14/2022)   Exercise Vital Sign    Days of Exercise per Week: 0 days    Minutes of Exercise per Session: 0 min  Stress: Stress Concern Present (10/14/2022)   [employer] of Occupational Health - Occupational Stress Questionnaire    Feeling of Stress : To some extent  Social Connections: Moderately Isolated (10/14/2022)   Social Connection and Isolation Panel    Frequency of Communication with Friends and Family: More than three times a week    Frequency of Social Gatherings with Friends and Family: Never    Attends Religious Services: Never    Database administrator or Organizations: No    Attends Banker Meetings: Never    Marital Status: Living with partner   Past Surgical History:  Procedure Laterality Date   ATRIAL FIBRILLATION ABLATION N/A 09/05/2022   Procedure: ATRIAL FIBRILLATION ABLATION;  Surgeon: Inocencio Soyla Lunger, MD;  Location: [facility];  Service: Cardiovascular;  Laterality: N/A;   ATRIAL FIBRILLATION ABLATION N/A 07/24/2023   Procedure: ATRIAL FIBRILLATION ABLATION;  Surgeon: Inocencio Soyla Lunger, MD;  Location: [facility];  Service: Cardiovascular;  Laterality: N/A;   CARDIAC CATHETERIZATION     CARDIAC VALVE REPLACEMENT     CARDIOVERSION N/A 04/02/2022   Procedure: CARDIOVERSION;  Surgeon: Elmira Newman PARAS, MD;  Location: [facility];  Service: Cardiovascular;  Laterality: N/A;   CARDIOVERSION N/A 04/27/2022   Procedure: CARDIOVERSION;  Surgeon: Elmira Newman PARAS, MD;  Location: [facility];  Service: Cardiovascular;  Laterality: N/A;   CARDIOVERSION N/A 03/27/2023   Procedure: CARDIOVERSION;  Surgeon: Alvan Ronal BRAVO, MD;  [facility]: [facility];  Service: Cardiovascular;  Laterality: N/A;   CARDIOVERSION N/A 05/06/2023   Procedure:  CARDIOVERSION;  Surgeon: Sheena Pugh, DO;  [facility]: [facility];  Service: Cardiovascular;  Laterality: N/A;   CARDIOVERSION N/A 06/02/2023   Procedure: CARDIOVERSION;  Surgeon: Jeffrie Oneil BROCKS, MD;  Location: [facility];  Service: Cardiovascular;  Laterality: N/A;   CHOLECYSTECTOMY     CORONARY ARTERY BYPASS GRAFT     CORONARY/GRAFT ANGIOGRAPHY N/A 08/18/2018   Procedure: CORONARY/GRAFT ANGIOGRAPHY;  Surgeon: Elmira Newman PARAS, MD;  [facility];  Service: Cardiovascular;  Laterality: N/A;   ELECTROPHYSIOLOGY STUDY N/A 02/04/2023   Procedure: ELECTROPHYSIOLOGY STUDY;  Surgeon: Inocencio Soyla Lunger, MD;  [facility];  Service: Cardiovascular;  Laterality: N/A;   ICD IMPLANT N/A 02/10/2023   Procedure: ICD IMPLANT;  Surgeon: Inocencio Soyla Lunger, MD;  Location: [facility];  Service: Cardiovascular;  Laterality: N/A;   LEG SURGERY     metal plate in leg   RIGHT HEART CATH AND CORONARY/GRAFT ANGIOGRAPHY N/A 07/21/2018   Procedure: RIGHT HEART CATH AND CORONARY/GRAFT ANGIOGRAPHY;  Surgeon: Elmira Newman PARAS, MD;  [facility];  Service: Cardiovascular;  Laterality: N/A;   TEE WITHOUT CARDIOVERSION N/A 07/27/2015   Procedure: TRANSESOPHAGEAL ECHOCARDIOGRAM (TEE);  Surgeon: Jerel Balding, MD;  Location: [facility] ENDOSCOPY;  Service: Cardiovascular;  Laterality: N/A;   TEE WITHOUT CARDIOVERSION N/A 07/21/2018   Procedure: TRANSESOPHAGEAL ECHOCARDIOGRAM (TEE);  Surgeon: Elmira Newman PARAS, MD;  Location: [facility] ENDOSCOPY;  Service: Cardiovascular;  Laterality: N/A;   TONSILLECTOMY AND ADENOIDECTOMY  TRANSESOPHAGEAL ECHOCARDIOGRAM (CATH LAB) N/A 05/06/2023   Procedure: TRANSESOPHAGEAL ECHOCARDIOGRAM;  Surgeon: Sheena Pugh, DO;  [facility];  Service: Cardiovascular;  Laterality: N/A;   TRANSESOPHAGEAL ECHOCARDIOGRAM (CATH LAB) N/A 06/02/2023   Procedure: TRANSESOPHAGEAL ECHOCARDIOGRAM;  Surgeon: Jeffrie Oneil BROCKS,  MD;  [facility];  Service: Cardiovascular;  Laterality: N/A;   Past Surgical History:  Procedure Laterality Date   ATRIAL FIBRILLATION ABLATION N/A 09/05/2022   Procedure: ATRIAL FIBRILLATION ABLATION;  Surgeon: Inocencio Soyla Lunger, MD;  [facility];  Service: Cardiovascular;  Laterality: N/A;   ATRIAL FIBRILLATION ABLATION N/A 07/24/2023   Procedure: ATRIAL FIBRILLATION ABLATION;  Surgeon: Inocencio Soyla Lunger, MD;  [facility];  Service: Cardiovascular;  Laterality: N/A;   CARDIAC CATHETERIZATION     CARDIAC VALVE REPLACEMENT     CARDIOVERSION N/A 04/02/2022   Procedure: CARDIOVERSION;  Surgeon: Elmira Newman PARAS, MD;  [facility];  Service: Cardiovascular;  Laterality: N/A;   CARDIOVERSION N/A 04/27/2022   Procedure: CARDIOVERSION;  Surgeon: Elmira Newman PARAS, MD;  [facility];  Service: Cardiovascular;  Laterality: N/A;   CARDIOVERSION N/A 03/27/2023   Procedure: CARDIOVERSION;  Surgeon: Alvan Ronal BRAVO, MD;  [facility];  Service: Cardiovascular;  Laterality: N/A;   CARDIOVERSION N/A 05/06/2023   Procedure: CARDIOVERSION;  Surgeon: Sheena Pugh, DO;  [facility];  Service: Cardiovascular;  Laterality: N/A;   CARDIOVERSION N/A 06/02/2023   Procedure: CARDIOVERSION;  Surgeon: Jeffrie Oneil BROCKS, MD;  [facility];  Service: Cardiovascular;  Laterality: N/A;   CHOLECYSTECTOMY     CORONARY ARTERY BYPASS GRAFT     CORONARY/GRAFT ANGIOGRAPHY N/A 08/18/2018   Procedure: CORONARY/GRAFT ANGIOGRAPHY;  Surgeon: Elmira Newman PARAS, MD;  [facility];  Service: Cardiovascular;  Laterality: N/A;   ELECTROPHYSIOLOGY STUDY N/A 02/04/2023   Procedure: ELECTROPHYSIOLOGY STUDY;  Surgeon: Inocencio Soyla Lunger, MD;  [facility];  Service: Cardiovascular;  Laterality: N/A;   ICD IMPLANT N/A 02/10/2023   Procedure: ICD IMPLANT;  Surgeon: Inocencio Soyla Lunger, MD;  [facility] INVAS[facility] CV LAB;  Service: Cardiovascular;  Laterality: N/A;   LEG SURGERY     metal plate in leg   RIGHT HEART CATH AND CORONARY/GRAFT ANGIOGRAPHY N/A 07/21/2018   Procedure: RIGHT HEART CATH AND CORONARY/GRAFT ANGIOGRAPHY;  Surgeon: Elmira Newman PARAS, MD;  [facility];  Service: Cardiovascular;  Laterality: N/A;   TEE WITHOUT CARDIOVERSION N/A 07/27/2015   Procedure: TRANSESOPHAGEAL ECHOCARDIOGRAM (TEE);  Surgeon: Jerel Balding, MD;  Location: [facility] ENDOSCOPY;  Service: Cardiovascular;  Laterality: N/A;   TEE WITHOUT CARDIOVERSION N/A 07/21/2018   Procedure: TRANSESOPHAGEAL ECHOCARDIOGRAM (TEE);  Surgeon: Elmira Newman PARAS, MD;  Location: [facility] ENDOSCOPY;  Service: Cardiovascular;  Laterality: N/A;   TONSILLECTOMY AND ADENOIDECTOMY     TRANSESOPHAGEAL ECHOCARDIOGRAM (CATH LAB) N/A 05/06/2023   Procedure: TRANSESOPHAGEAL ECHOCARDIOGRAM;  Surgeon: Sheena Pugh, DO;  [facility];  Service: Cardiovascular;  Laterality: N/A;   TRANSESOPHAGEAL ECHOCARDIOGRAM (CATH LAB) N/A 06/02/2023   Procedure: TRANSESOPHAGEAL ECHOCARDIOGRAM;  Surgeon: Jeffrie Oneil BROCKS, MD;  [facility];  Service: Cardiovascular;  Laterality: N/A;   Past Medical History:  Diagnosis Date   Allergy  ?   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 01/01/2017 01/01/2017   BP 111/70 (BP Location: Left Arm, Patient Position: Sitting, Cuff  Size: Normal)   Pulse 76   Ht 5' 5 (1.651 m)   Wt 205 lb (93 kg)   LMP 06/20/2015   SpO2 98%   BMI 34.11 kg/m   Opioid Risk Score:   Fall Risk Score:  `1  Depression screen [facility] 2/9     10/31/2023    2:09 PM 10/03/2023    9:28 AM 07/31/2023    9:03 AM 04/14/2023    1:38 PM 01/07/2023    9:27 AM 12/23/2022   10:25 AM 10/14/2022   10:49 AM  Depression screen PHQ 2/9  Decreased Interest 0 0   1 0 0  Down, Depressed, Hopeless 0 0   1 0 1  PHQ - 2 Score 0 0   2 0 1  Altered sleeping 0     3 0  Tired, decreased energy 0     1 1  Change in appetite 0     3 0  Feeling bad or failure about yourself  0     0 0  Trouble concentrating 0     0 0  Moving slowly or fidgety/restless 0     0 0  Suicidal thoughts 0     0 0  PHQ-9 Score 0     7 2  Difficult doing work/chores Somewhat difficult     Not difficult at all Somewhat difficult     Information is confidential and restricted. Go to Review Flowsheets to unlock data.     Review of Systems  Musculoskeletal:  Positive for back pain.  All other systems reviewed and are negative.      Objective:   Physical Exam  Gen: no distress, normal appearing HEENT: oral mucosa pink and moist, NCAT Cardio: Reg rate Chest: normal effort, normal rate of breathing Abd: soft, non-distended Ext: no edema Psych: pleasant, normal affect Skin: intact Neuro: Alert and awake, follows commands, cranial nerves II through XII intact, intact insight and judgment, speech and language intact Strength 5 out of 5 in all 4 extremities Sensation intact light touch in all 4 extremities No ankle clonus Musculoskeletal:  On examination, tenderness to palpation is present over the low back. The most significant tenderness is noted over the right L4-5 level, with less severe pain L3-4 and L5-S1 levels. There is also some tenderness over the left L4-5 facet joint, but less severe. Palpation of the upper back is not as tender. Extension of the lumbar spine (bending  backward) exacerbates the pain at the L4-5 level. Axial loading with extension also reproduces tenderness in that area.    Prior exam:  Mild tenderness noted over the right SI joint   MRI L-spine 1/17/2024IMPRESSION: 1. L4-L5 moderate facet arthropathy, which can be a cause of back pain. Narrowing of the lateral recesses at this level could affect the descending L5 nerve roots. 2. No spinal canal stenosis or neural foraminal narrowing.       Assessment & Plan:    Worsening chronic low back pain, consistent  with L4-5 facet arthropathy, worse on the right. This correlates with the MRI from approximately 1.5 years ago which showed moderate facet arthritis, most significant at L4-5. The pain is severely impacting function and quality of life. -No improvement with Tylenol , unable to take NSAIDs due to cardiac history - Intermittent right SI joint tenderness has been noted, continue to monitor -Pain is primarily axial in her lower back, suspect this may be facet joint mediated -ORT low -TENS unit, nexwave device ordered prior visit -Discontinued Norco 7.5 twice daily prior visit -Cymbalta  20 mg daily- She did not tolerate -She reports Physical Therapy provided minimal improvement -Pain agreement completed prior visit -Refer to Dr Carilyn for Right MBB/RFA R L3-4,4-5,5-S1  -Continue UDS, Pill Counts, PDMP monitoring  -She is on gabapentin  for restless leg. Increased to 200mg  HS prior visit -Pill counts consistent today - Continue Percocet to 7.5 mg twice daily  - Butrans  caused rash  Ambulation Difficulty -Refer to physical therapy for a mobility assessment to evaluate the need for a scooter or other assistive device. A referral will be sent to a location in [facility].   Chronic anxiety, denies SI or HI -No longer on Celexa , Cymbalta - she says it made her feel bad -Continue follow-up with mental health as directed  Prior warnings -2nd warning today forgot pills for count  10/03/23 -She forgot pills prior visit- and had used codeine  cough syrup and  warning was given at the time   Addednum 9/18 for 5 days I'm ok with her trying Q12h PRN 1.5 tabs of the 7.5 mg.

## 2023-11-03 ENCOUNTER — Encounter: Payer: Self-pay | Admitting: Physical Medicine & Rehabilitation

## 2023-11-04 ENCOUNTER — Encounter: Payer: Self-pay | Admitting: Psychiatry

## 2023-11-04 ENCOUNTER — Telehealth: Admitting: Psychiatry

## 2023-11-04 ENCOUNTER — Ambulatory Visit: Attending: Cardiology

## 2023-11-04 DIAGNOSIS — F4310 Post-traumatic stress disorder, unspecified: Secondary | ICD-10-CM

## 2023-11-04 DIAGNOSIS — I480 Paroxysmal atrial fibrillation: Secondary | ICD-10-CM

## 2023-11-04 DIAGNOSIS — Z952 Presence of prosthetic heart valve: Secondary | ICD-10-CM | POA: Diagnosis not present

## 2023-11-04 DIAGNOSIS — Z5181 Encounter for therapeutic drug level monitoring: Secondary | ICD-10-CM

## 2023-11-04 DIAGNOSIS — F411 Generalized anxiety disorder: Secondary | ICD-10-CM | POA: Diagnosis not present

## 2023-11-04 DIAGNOSIS — G4700 Insomnia, unspecified: Secondary | ICD-10-CM | POA: Diagnosis not present

## 2023-11-04 DIAGNOSIS — F330 Major depressive disorder, recurrent, mild: Secondary | ICD-10-CM

## 2023-11-04 LAB — POCT INR: INR: 3.4 — AB (ref 2.0–3.0)

## 2023-11-04 MED ORDER — HYDROXYZINE HCL 50 MG PO TABS: 50.0000 mg | ORAL_TABLET | Freq: Every day | ORAL | 1 refills | Status: DC | PRN

## 2023-11-04 MED ORDER — CLONAZEPAM 0.25 MG PO TBDP: 0.2500 mg | ORAL_TABLET | Freq: Every evening | ORAL | 1 refills | Status: AC | PRN

## 2023-11-04 MED ORDER — RAMELTEON 8 MG PO TABS: 8.0000 mg | ORAL_TABLET | Freq: Every day | ORAL | 0 refills | Status: DC

## 2023-11-04 NOTE — Patient Instructions (Signed)
 Continue hydroxyzine  50 mg daily as needed for anxiety Decrease clonazepam  0.25 mg at night as needed for insomnia, anxiety  Continue ramelteon  8 mg at night  Next appointment-  10/31 at 10 am

## 2023-11-04 NOTE — Patient Instructions (Signed)
 Continue taking warfarin 1 tablet (5mg ) daily EXCEPT 1/2 tablet on Sundays.  Recheck INR in 2 weeks  Anticoagulation Clinic [phone]  [facility] Clearance request Fax #[phone] *Amiodarone  400mg  BID*

## 2023-11-06 ENCOUNTER — Ambulatory Visit (INDEPENDENT_AMBULATORY_CARE_PROVIDER_SITE_OTHER): Admitting: Licensed Clinical Social Worker

## 2023-11-06 DIAGNOSIS — F4310 Post-traumatic stress disorder, unspecified: Secondary | ICD-10-CM

## 2023-11-06 DIAGNOSIS — F411 Generalized anxiety disorder: Secondary | ICD-10-CM

## 2023-11-06 DIAGNOSIS — F330 Major depressive disorder, recurrent, mild: Secondary | ICD-10-CM

## 2023-11-06 NOTE — Progress Notes (Signed)
 THERAPIST PROGRESS NOTE  Session Time: 10:05am-10:47am  Participation Level: Active  Behavioral Response: CasualAlertEuthymic  Type of Therapy: Individual Therapy  Treatment Goals addressed:  Active     BH CCP Acute or Chronic Trauma Reaction     LTG: Recall traumatic events without becoming overwhelmed with negative emotions     Start:  07/31/23    Expected End:  12/20/23         STG: I want to be able to say what I want to say.     Start:  07/31/23    Expected End:  12/20/23         STG: Kimberly Hobbs will identify coping strategies to deal with trauma memories and the associated emotional reaction     Start:  07/31/23    Expected End:  12/20/23         Cooperate with trauma-focused psychotherapy techniques to reduce emotional reaction to the traumatic event      Start:  07/31/23         Work with Kimberly Hobbs to construct a list of the situations, people, & places that Kimberly Hobbs evoke the most distressing symptoms; suggest that they keep a journal of instances of stress being triggered     Start:  07/31/23         Teach Kimberly Hobbs coping strategies (e.g., writing down thoughts and feelings in a journal; taking deep, slow breaths; calling a support person to talk about memories) to deal with trauma memories and sudden emotional reactions without becoming emotionally nu     Start:  07/31/23         Provide Kimberly Hobbs education on communication patterns in relationships     Start:  07/31/23            ProgressTowards Goals: Progressing  Interventions: Supportive and Other: Mindfulness EMDR  Summary:  Kimberly Hobbs is a 63 y.o. female who presents with little interest, low mood, fatigue, difficulties with sleep, changes to her appetite, falling anxious, uncontrollable worry, restlessness, flashbacks and hypervigilance. Pt was oriented times 5. Pt was cooperative and engaged. Pt denies SI/HI/AVH.    Patient expressed pride in her ability to establish healthier boundaries  with her children citing improvements with refraining from apologizing for others pleasure despite her doing no wrong.  Patient reflected on the impact of recent episode of chronic pain citing arthritis in her back.  Patient expressed hope that she is soon to get cortisol shots in her back to aid in pain management.  Patient reflected on unresolved trauma related to losing a series of loved ones and friends to opioid addiction which has made her more cognizant of pain management strategies.  Patient began EMDR treatment by resourcing.  Clinician educated patient on acupressure breathing, 7-11 breathing, eye roll breathing, belly breathing.  Patient identified acupressure breathing and I will breathing to be the most effective for her.  Patient will practice the skills routinely until next session.  Patient also expressed an interest to practice resourcing techniques to aid in alleviating chronic pain.  Suicidal/Homicidal: Nowithout intent/plan  Therapist Response:  Clinician utilized active and supportive reflection to create a safe space for patient to process recent life events.  Clinician assessed for current symptoms, safety, stressors since last session.  Began EMDR with patient by educating her on acupressure breathing, 7-11 breathing, eye roll breathing, and belly breathing.  Plan: Return again in 2 weeks.  Diagnosis: PTSD (post-traumatic stress disorder)  MDD (major depressive disorder), recurrent episode, mild (HCC)  Anxiety  state    Collaboration of Care: AEB psychiatrist can access notes and cln. Will review psychiatrists' notes. Check in with the patient and will see LCSW per availability. Patient agreed with treatment recommendations.   Patient/Guardian was advised Release of Information must be obtained prior to any record release in order to collaborate their care with an outside provider. Patient/Guardian was advised if they have not already done so to contact the registration  department to sign all necessary forms in order for us  to release information regarding their care.   Consent: Patient/Guardian gives verbal consent for treatment and assignment of benefits for services provided during this visit. Patient/Guardian expressed understanding and agreed to proceed.   Kimberly KATHEE Husband, LCSW 11/06/2023

## 2023-11-07 ENCOUNTER — Encounter (HOSPITAL_COMMUNITY): Payer: Self-pay

## 2023-11-07 ENCOUNTER — Ambulatory Visit (HOSPITAL_COMMUNITY): Admitting: Physician Assistant

## 2023-11-08 ENCOUNTER — Other Ambulatory Visit: Payer: Self-pay | Admitting: Psychiatry

## 2023-11-10 NOTE — Telephone Encounter (Signed)
 Called pharmacy spoke to Stuart she stated that the patient can pick up the Klonopin  today called patient to make aware she voiced understanding

## 2023-11-10 NOTE — Telephone Encounter (Signed)
 Could you please verify with the pharmacy to ensure she can pick up the medication today? The order has already been placed.

## 2023-11-11 ENCOUNTER — Ambulatory Visit: Payer: 59

## 2023-11-11 DIAGNOSIS — I480 Paroxysmal atrial fibrillation: Secondary | ICD-10-CM | POA: Diagnosis not present

## 2023-11-12 LAB — CUP PACEART REMOTE DEVICE CHECK
Battery Remaining Longevity: 129 mo
Battery Voltage: 3.04 V
Brady Statistic RV Percent Paced: 0.05 %
Date Time Interrogation Session: 20250922204356
HighPow Impedance: 67 Ohm
Implantable Lead Connection Status: 753985
Implantable Lead Connection Status: 753985
Implantable Lead Implant Date: 20241223
Implantable Lead Implant Date: 20241223
Implantable Lead Location: 753859
Implantable Lead Location: 753860
Implantable Lead Model: 5076
Implantable Lead Model: 5076
Implantable Pulse Generator Implant Date: 20241223
Lead Channel Impedance Value: 304 Ohm
Lead Channel Impedance Value: 304 Ohm
Lead Channel Impedance Value: 380 Ohm
Lead Channel Pacing Threshold Amplitude: 0.75 V
Lead Channel Pacing Threshold Amplitude: 0.75 V
Lead Channel Pacing Threshold Pulse Width: 0.4 ms
Lead Channel Pacing Threshold Pulse Width: 0.4 ms
Lead Channel Sensing Intrinsic Amplitude: 2.3 mV
Lead Channel Sensing Intrinsic Amplitude: 22 mV
Lead Channel Setting Pacing Amplitude: 1.5 V
Lead Channel Setting Pacing Amplitude: 2 V
Lead Channel Setting Pacing Pulse Width: 0.4 ms
Lead Channel Setting Sensing Sensitivity: 0.3 mV
Zone Setting Status: 755011
Zone Setting Status: 755011

## 2023-11-12 NOTE — Progress Notes (Signed)
Remote ICD Transmission.

## 2023-11-13 ENCOUNTER — Ambulatory Visit: Payer: Self-pay | Admitting: Cardiology

## 2023-11-14 ENCOUNTER — Ambulatory Visit: Admitting: Physical Medicine & Rehabilitation

## 2023-11-14 ENCOUNTER — Encounter: Admitting: Physical Medicine & Rehabilitation

## 2023-11-18 ENCOUNTER — Ambulatory Visit: Attending: Cardiology

## 2023-11-18 DIAGNOSIS — Z952 Presence of prosthetic heart valve: Secondary | ICD-10-CM | POA: Diagnosis not present

## 2023-11-18 DIAGNOSIS — I480 Paroxysmal atrial fibrillation: Secondary | ICD-10-CM | POA: Diagnosis not present

## 2023-11-18 DIAGNOSIS — Z5181 Encounter for therapeutic drug level monitoring: Secondary | ICD-10-CM

## 2023-11-18 LAB — POCT INR: INR: 2.8 (ref 2.0–3.0)

## 2023-11-18 NOTE — Patient Instructions (Signed)
 Continue taking warfarin 1 tablet (5mg ) daily EXCEPT 1/2 tablet on Sundays.  Recheck INR in 1 weeks  An[facility] Clinic [phone]  [facility] Clear[facility] request Fax #[phone] *Amiodarone  400mg  BID*

## 2023-11-19 ENCOUNTER — Ambulatory Visit (INDEPENDENT_AMBULATORY_CARE_PROVIDER_SITE_OTHER): Admitting: Licensed Clinical Social Worker

## 2023-11-19 DIAGNOSIS — Z7901 Long term (current) use of anticoagulants: Secondary | ICD-10-CM | POA: Diagnosis not present

## 2023-11-19 DIAGNOSIS — Z952 Presence of prosthetic heart valve: Secondary | ICD-10-CM | POA: Diagnosis not present

## 2023-11-19 DIAGNOSIS — F330 Major depressive disorder, recurrent, mild: Secondary | ICD-10-CM

## 2023-11-19 DIAGNOSIS — I480 Paroxysmal atrial fibrillation: Secondary | ICD-10-CM | POA: Diagnosis not present

## 2023-11-19 DIAGNOSIS — F4310 Post-traumatic stress disorder, unspecified: Secondary | ICD-10-CM

## 2023-11-19 DIAGNOSIS — K625 Hemorrhage of anus and rectum: Secondary | ICD-10-CM | POA: Diagnosis not present

## 2023-11-19 DIAGNOSIS — E670 Hypervitaminosis A: Secondary | ICD-10-CM | POA: Diagnosis not present

## 2023-11-19 DIAGNOSIS — Z23 Encounter for immunization: Secondary | ICD-10-CM | POA: Diagnosis not present

## 2023-11-19 DIAGNOSIS — E038 Other specified hypothyroidism: Secondary | ICD-10-CM | POA: Diagnosis not present

## 2023-11-19 DIAGNOSIS — F411 Generalized anxiety disorder: Secondary | ICD-10-CM | POA: Diagnosis not present

## 2023-11-19 NOTE — Progress Notes (Signed)
 THERAPIST PROGRESS NOTE  Virtual Visit via Video Note  I connected with Kimberly Hobbs on 11/19/23 at 10:00 AM EDT by a video enabled telemedicine application and verified that I am speaking with the correct person using two identifiers.  Location: Patient: address on file  Provider: Providers address   I discussed the limitations of evaluation and management by telemedicine and the availability of in person appointments. The patient expressed understanding and agreed to proceed.   I discussed the assessment and treatment plan with the patient. The patient was provided an opportunity to ask questions and all were answered. The patient agreed with the plan and demonstrated an understanding of the instructions.   The patient was advised to call back or seek an in-person evaluation if the symptoms worsen or if the condition fails to improve as anticipated.  I provided 57 minutes of non-face-to-face time during this encounter.   Kimberly Hobbs Husband, LCSW   Session Time: 10:02am-10:59am  Participation Level: Active  Behavioral Response: CasualAlertEuthymic  Type of Therapy: Individual Therapy  Treatment Goals addressed:  Active     BH CCP Acute or Chronic Trauma Reaction     LTG: Recall traumatic events without becoming overwhelmed with negative emotions     Start:  07/31/23    Expected End:  12/20/23         STG: I want to be able to say what I want to say.     Start:  07/31/23    Expected End:  12/20/23         STG: Akeyla will identify coping strategies to deal with trauma memories and the associated emotional reaction     Start:  07/31/23    Expected End:  12/20/23         Cooperate with trauma-focused psychotherapy techniques to reduce emotional reaction to the traumatic event      Start:  07/31/23         Work with Kimberly to construct a list of the situations, people, & places that [place] evoke the most distressing symptoms; suggest that they keep a  journal of instances of stress being triggered     Start:  07/31/23         Teach Kimberly coping strategies (e.g., writing down thoughts and feelings in a journal; taking deep, slow breaths; calling a support person to talk about memories) to deal with trauma memories and sudden emotional reactions without becoming emotionally nu     Start:  07/31/23         Provide Kimberly education on communication patterns in relationships     Start:  07/31/23            ProgressTowards Goals: Progressing  Interventions: Supportive, Reframing, and Other: EMDR Mindfulness  Summary: Kimberly Hobbs is a 63 y.o. female who presents with little interest, low mood, fatigue, difficulties with sleep, changes to her appetite, falling anxious, uncontrollable worry, restlessness, flashbacks and hypervigilance. Pt was oriented times 5. Pt was cooperative and engaged. Pt denies SI/HI/AVH.    The patient reflected on her boundaries with her children, expressing pride in her ability to model this for her family. She also reflected on the impact of her mother's parenting style on her role as a mother. Processed patients journey to forgveness.  The clinician and patient continued with EMDR through resourcing. The clinician educated the patient on constructing her container and peaceful place. The patient will practice these skills regularly until her next appointment.   Suicidal/Homicidal:  Nowithout intent/plan  Therapist Response: Clinician utilized active and supportive reflection to create a safe space for patient to process recent life events. Clinician assessed for current symptoms, safety, stressors since last session. Cln educated patient on constructing her container and peaceful place.   Plan: Return again in 2 weeks.  Diagnosis: PTSD (post-traumatic stress disorder)  MDD (major depressive disorder), recurrent episode, mild  Anxiety state   Collaboration of Care: AEB psychiatrist can access  notes and cln. Will review psychiatrists' notes. Check in with the patient and will see LCSW per availability. Patient agreed with treatment recommendations.   Patient/Guardian was advised Release of Information must be obtained prior to any record release in order to collaborate their care with an outside provider. Patient/Guardian was advised if they have not already done so to contact the registration department to sign all necessary forms in order for us  to release information regarding their care.   Consent: Patient/Guardian gives verbal consent for treatment and assignment of benefits for services provided during this visit. Patient/Guardian expressed understanding and agreed to proceed.   Kimberly Hobbs Husband, LCSW 11/19/2023

## 2023-11-20 ENCOUNTER — Ambulatory Visit (HOSPITAL_COMMUNITY)
Admission: RE | Admit: 2023-11-20 | Discharge: 2023-11-20 | Disposition: A | Discharge status: Home / Self Care | Source: Ambulatory Visit | Attending: Physician Assistant | Admitting: Physician Assistant

## 2023-11-20 VITALS — BP 114/64 | HR 77 | Ht 65.0 in | Wt 205.8 lb

## 2023-11-20 DIAGNOSIS — Z5181 Encounter for therapeutic drug level monitoring: Secondary | ICD-10-CM

## 2023-11-20 DIAGNOSIS — D6869 Other thrombophilia: Secondary | ICD-10-CM | POA: Diagnosis not present

## 2023-11-20 DIAGNOSIS — Z79899 Other long term (current) drug therapy: Secondary | ICD-10-CM

## 2023-11-20 DIAGNOSIS — I4819 Other persistent atrial fibrillation: Secondary | ICD-10-CM | POA: Diagnosis not present

## 2023-11-20 DIAGNOSIS — I4891 Unspecified atrial fibrillation: Secondary | ICD-10-CM | POA: Diagnosis not present

## 2023-11-20 MED ORDER — AMIODARONE HCL 200 MG PO TABS: 200.0000 mg | ORAL_TABLET | Freq: Every day | ORAL | 1 refills | Status: DC

## 2023-11-20 MED ORDER — AMIODARONE HCL 100 MG PO TABS: 100.0000 mg | ORAL_TABLET | Freq: Every day | ORAL | Status: DC

## 2023-11-20 NOTE — Addendum Note (Signed)
 Encounter addended by: Franchot Glade RAMAN, RN on: 11/20/2023 2:32 PM  Actions taken: Order list changed

## 2023-11-20 NOTE — Progress Notes (Signed)
 Primary Care Physician: Edwyna Jennifer SAUNDERS, MD Primary Cardiologist: Kardie Tobb, DO Electrophysiologist: Soyla Gladis Norton, MD  Referring Physician: Dr Norton Cones Kimberly Hobbs is a 63 y.o. 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 She was admitted to the hospital March 2024 with acute on chronic diastolic heart failure and rapid atrial fibrillation. She had previously failed amiodarone . She was seen by Dr Norton and underwent afib ablation on 09/05/22. Patient is on warfarin for stroke prevention.   She was hospitalized 01/2023 with VT and is s/p ICD implant. Amiodarone  was resumed. She continued to have issues with recurrent afib despite amiodarone  and is s/p repeat afib and flutter ablation 07/24/23.   Patient returns for follow up for atrial fibrillation and amiodarone  monitoring. She reports that she will occasionally have very brief palpitations lasting only a few seconds but no sustained symptoms. Her ICD shows <0.1% afib burden. No bleeding issues on anticoagulation. Patient decreased her amiodarone  to 100 mg daily due to having jerking movements in her extremities.   Today, she  denies symptoms of palpitations, chest pain, shortness of breath, orthopnea, PND, lower extremity edema, dizziness, presyncope, syncope, snoring, daytime somnolence, bleeding, or neurologic sequela. The patient is tolerating medications without difficulties and is otherwise without complaint today.    Atrial Fibrillation Risk Factors:  she does have symptoms or diagnosis of sleep apnea. she does have a history of rheumatic fever.   Atrial Fibrillation Management history:  Previous antiarrhythmic drugs: amiodarone  Previous cardioversions: 04/02/22, 04/27/22, 03/27/23, 05/06/23,  06/02/23 Previous ablations: 09/05/22, 07/24/23 Anticoagulation history: warfarin   ROS- All systems are reviewed and negative except as per the HPI above.  Past Medical History:  Diagnosis Date   Allergy  ?   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 01/01/2017 01/01/2017    Current Outpatient Medications  Medication Sig Dispense Refill   Accu-Chek Softclix Lancets lancets Use as instructed 100 each 12   acetaminophen  (TYLENOL ) 500 MG tablet Take 1,000 mg by mouth every 6 (six) hours as needed for moderate pain (pain score 4-6). (Patient taking differently: Take 1,000 mg by mouth as needed for moderate pain (pain score 4-6).)     albuterol  (ACCUNEB ) 0.63 MG/3ML nebulizer solution Take 1 ampule by nebulization every 4 (four) hours as needed for wheezing or shortness of breath.     amiodarone  (PACERONE ) 200 MG tablet Take 1 tablet (200 mg total) by mouth daily. (Patient taking differently: Take 100 mg by mouth daily.)     aspirin  EC 81 MG tablet Take 81 mg by mouth daily.      Blood Glucose Monitoring Suppl (ONE TOUCH ULTRA 2) w/Device KIT 3 (three) times daily.  Budeson-Glycopyrrol-Formoterol  (BREZTRI  AEROSPHERE) 160-9-4.8 MCG/ACT AERO Inhale 2 puffs into the lungs in the morning and at bedtime. 10.7 g 11   clonazePAM  (KLONOPIN ) 0.25 MG disintegrating tablet Take 1 tablet (0.25 mg total) by mouth at bedtime as needed (anxiety, insomnia). 30 tablet 1   furosemide  (LASIX ) 40  MG tablet Take 1 tablet (40 mg total) by mouth 2 (two) times daily. 180 tablet 3   gabapentin  (NEURONTIN ) 100 MG capsule Take 2 capsules (200 mg total) by mouth at bedtime. (Patient taking differently: Take 100 mg by mouth at bedtime.) 60 capsule 3   Glucose Blood (BLOOD GLUCOSE TEST STRIPS) STRP 1 each by In Vitro route in the morning, at noon, and at bedtime. May substitute to any manufacturer covered by patient's insurance. 100 strip 11   [START ON 11/28/2023] hydrOXYzine  (ATARAX ) 50 MG tablet Take 1 tablet (50 mg total) by mouth daily as needed for anxiety. 30 tablet 1   isosorbide  mononitrate (IMDUR ) 60 MG 24 hr tablet Take 1 tablet (60 mg total) by mouth daily. 90 tablet 3   magnesium  oxide (MAG-OX) 400 (240 Mg) MG tablet Take 1 tablet (400 mg total) by mouth daily. 30 tablet 6   metFORMIN  (GLUCOPHAGE ) 500 MG tablet Take 1 tablet (500 mg total) by mouth 2 (two) times daily with a meal. 180 tablet 3   metoprolol  succinate (TOPROL -XL) 100 MG 24 hr tablet Take 1 tablet (100 mg total) by mouth 2 (two) times daily. 180 tablet 3   montelukast  (SINGULAIR ) 10 MG tablet Take 10 mg by mouth daily.     nitroGLYCERIN  (NITROSTAT ) 0.4 MG SL tablet Place 1 tablet (0.4 mg total) under the tongue every 5 (five) minutes as needed for chest pain. Up to 3 times. 25 tablet 5   ondansetron  (ZOFRAN ) 8 MG tablet Take 8 mg by mouth every 8 (eight) hours as needed.     oxyCODONE -acetaminophen  (PERCOCET) 7.5-325 MG tablet Take 1 tablet by mouth every 12 (twelve) hours as needed for severe pain (pain score 7-10). Do not fill until 10/08/23 please 60 tablet 0   pantoprazole  (PROTONIX ) 40 MG tablet Take 1 tablet (40 mg total) by mouth 2 (two) times daily. 180 tablet 0   potassium chloride  SA (KLOR-CON  M) 20 MEQ tablet Take 1 tablet (20 mEq total) by mouth daily. Take 2 tablets once at dinner 03/26/23 and then 1 tablet daily starting 03/27/23 30 tablet 0   rosuvastatin  (CRESTOR ) 40 MG tablet TAKE 1 TABLET(40 MG) BY MOUTH DAILY 90  tablet 1   sacubitril -valsartan  (ENTRESTO ) 24-26 MG Take 1 tablet by mouth 2 (two) times daily. 180 tablet 3   warfarin (COUMADIN ) 5 MG tablet Take Warfarin 5 mg by mouth daily except for  7.5 mg on Mondays or as directed by the coumadin  clinic. 40 tablet 1   warfarin (COUMADIN ) 7.5 MG tablet Take 7.5 mg by mouth See admin instructions. Tues, Fri, and Sun     No current facility-administered medications for this encounter.    Physical Exam: BP 114/64   Pulse 77   Ht 5' 5 (1.651 m)   Wt 93.4 kg   LMP 06/20/2015   BMI 34.25 kg/m   GEN: Well nourished, well developed in no acute distress CARDIAC: Regular rate and rhythm, no murmurs, rubs, gallops. Mech click RESPIRATORY:  Clear to auscultation without rales, wheezing or rhonchi  ABDOMEN: Soft, non-tender, non-distended EXTREMITIES:  No edema; No deformity    Wt Readings from Last 3 Encounters:  11/20/23 93.4 kg  10/31/23  93 kg  10/03/23 93 kg     EKG today demonstrates  A paced rhythm Vent. rate 77 BPM PR interval 302 ms QRS duration 126 ms QT/QTcB 452/511 ms   Echo 02/03/23 demonstrated   1. Left ventricular ejection fraction, by estimation, is 55 to 60%. The  left ventricle has normal function. The left ventricle has no regional  wall motion abnormalities. Left ventricular diastolic parameters are  indeterminate.   2. Right ventricular systolic function is mildly reduced. The right  ventricular size is normal.   3. Left atrial size was mildly dilated.   4. The mitral valve has been repaired/replaced. No evidence of mitral  valve regurgitation. No evidence of mitral stenosis. The mean mitral valve  gradient is 3.0 mmHg. Echo findings are consistent with normal structure  and function of the mitral valve  prosthesis.   5. The aortic valve has been repaired/replaced. There is moderate  calcification of the aortic valve. Aortic valve regurgitation is not  visualized. Mild aortic valve stenosis. Aortic valve area, by  VTI measures  1.34 cm. Aortic valve mean gradient  measures 19.5 mmHg. Aortic valve Vmax measures 3.00 m/s.   6. The inferior vena cava is normal in size with greater than 50%  respiratory variability, suggesting right atrial pressure of 3 mmHg.   Conclusion(s)/Recommendation(s): Gradient through mechanical aortic valve mildly elevated but no change since echo 2/24.    CHA2DS2-VASc Score = 5  The patient's score is based upon: CHF History: 1 HTN History: 1 Diabetes History: 1 Stroke History: 0 Vascular Disease History: 1 Age Score: 0 Gender Score: 1       ASSESSMENT AND PLAN: Persistent Atrial Fibrillation/atrial flutter (ICD10:  I48.19) The patient's CHA2DS2-VASc score is 5, indicating a 7.2% annual risk of stroke.   S/p afib ablation 09/05/22 and afib/flutter ablation 07/24/23 Patient maintaining SR, <0.1% burden on device. Continue amiodarone  100 mg daily Continue Toprol  100 mg BID Continue warfarin  Secondary Hypercoagulable State (ICD10:  D68.69) The patient is at significant risk for stroke/thromboembolism based upon her CHA2DS2-VASc Score of 5.  Continue Warfarin (Coumadin ). No bleeding issues.   High Risk Medication Monitoring (ICD 10: [id number]) Patient requires ongoing monitoring for anti-arrhythmic medication which has the potential to cause life threatening arrhythmias. Intervals on ECG acceptable for amiodarone  monitoring. Patient had blood work yesterday, fax number provided to get results.   CAD S/p CABG 2020 No anginal symptoms Followed by Dr Sheena  HTN Stable on current regimen  VHD Rheumatic S/p mitral and aortic valve replacements 2018 Continue warfarin  Obesity Body mass index is 34.25 kg/m.  Encouraged lifestyle modification  Chronic HFpEF EF 55-60% GDMT per primary cardiology team Fluid status appears stable today  OSA  Encouraged nightly CPAP  VT S/p ICD, followed by Dr Inocencio   Follow up with Dr Inocencio in 6 months.    Daril Kicks PA-C [facility] [address] [place], [place] [address] [phone]

## 2023-11-24 DIAGNOSIS — G4733 Obstructive sleep apnea (adult) (pediatric): Secondary | ICD-10-CM | POA: Diagnosis not present

## 2023-11-25 ENCOUNTER — Ambulatory Visit

## 2023-11-26 DIAGNOSIS — Z7901 Long term (current) use of anticoagulants: Secondary | ICD-10-CM | POA: Diagnosis not present

## 2023-11-26 DIAGNOSIS — Z5181 Encounter for therapeutic drug level monitoring: Secondary | ICD-10-CM | POA: Diagnosis not present

## 2023-11-26 DIAGNOSIS — Z952 Presence of prosthetic heart valve: Secondary | ICD-10-CM | POA: Diagnosis not present

## 2023-12-01 NOTE — Telephone Encounter (Signed)
 There should be an active order at the pharmacy. Could you please verify this and update the patient accordingly? Thanks.

## 2023-12-02 ENCOUNTER — Ambulatory Visit (INDEPENDENT_AMBULATORY_CARE_PROVIDER_SITE_OTHER): Admitting: Licensed Clinical Social Worker

## 2023-12-02 DIAGNOSIS — F330 Major depressive disorder, recurrent, mild: Secondary | ICD-10-CM

## 2023-12-02 DIAGNOSIS — F411 Generalized anxiety disorder: Secondary | ICD-10-CM

## 2023-12-02 DIAGNOSIS — F4310 Post-traumatic stress disorder, unspecified: Secondary | ICD-10-CM | POA: Diagnosis not present

## 2023-12-02 NOTE — Progress Notes (Signed)
 THERAPIST PROGRESS NOTE  Virtual Visit via Video Note  I connected with Kimberly Hobbs on 12/02/23 at 10:00 AM EDT by a video enabled telemedicine application and verified that I am speaking with the correct person using two identifiers.  Location: Patient: Address on file  Provider: [facility]   I discussed the limitations of evaluation and management by telemedicine and the availability of in person appointments. The patient expressed understanding and agreed to proceed.   I discussed the assessment and treatment plan with the patient. The patient was provided an opportunity to ask questions and all were answered. The patient agreed with the plan and demonstrated an understanding of the instructions.   The patient was advised to call back or seek an in-person evaluation if the symptoms worsen or if the condition fails to improve as anticipated.  I provided 47 minutes of non-face-to-face time during this encounter.   Kimberly KATHEE Husband, LCSW   Session Time: 10-10:47am  Participation Level: Active  Behavioral Response: CasualAlertEuthymic  Type of Therapy: Individual Therapy  Treatment Goals addressed:  Active     BH CCP Acute or Chronic Trauma Reaction     LTG: Recall traumatic events without becoming overwhelmed with negative emotions     Start:  07/31/23    Expected End:  12/20/23         STG: I want to be able to say what I want to say.     Start:  07/31/23    Expected End:  12/20/23         STG: Jaleiah will identify coping strategies to deal with trauma memories and the associated emotional reaction     Start:  07/31/23    Expected End:  12/20/23         Cooperate with trauma-focused psychotherapy techniques to reduce emotional reaction to the traumatic event      Start:  07/31/23         Work with Kimberly to construct a list of the situations, people, & places that [name] evoke the most distressing symptoms; suggest that they keep a journal of  instances of stress being triggered     Start:  07/31/23         Teach Kimberly coping strategies (e.g., writing down thoughts and feelings in a journal; taking deep, slow breaths; calling a support person to talk about memories) to deal with trauma memories and sudden emotional reactions without becoming emotionally nu     Start:  07/31/23         Provide Kimberly education on communication patterns in relationships     Start:  07/31/23          Progress Towards Goals: Progressing  Interventions: Supportive and Other: EMDR  Summary: Kimberly Hobbs is a 63 y.o. female who presents with little interest, low mood, fatigue, difficulties with sleep, changes to her appetite, falling anxious, uncontrollable worry, restlessness, flashbacks and hypervigilance. Pt was oriented times 5. Pt was cooperative and engaged. Pt denies SI/HI/AVH.     Reports she has been successful in practicing use of her container and peaceful place. Reports she has  often shared with her kids the coping skills she is learning.  Patient continued EMDR by constructing her target sequence plan reflecting on memories that have instilled the belief I cannot do anything right.  Patient reflected on misplaced guilt and shame related to previous life choices.  Clinician briefly worked with patient to reframe these negative cognitions helping patient understand she did  the best she could.  Reflected on familial trauma and impact to her parents parenting style.   Patient used the container to assist in returning to baseline.  Suicidal/Homicidal: Nowithout intent/plan  Therapist Response: Clinician utilized active and supportive reflection to create a safe space for patient to process recent life events. Clinician assessed for current symptoms, safety, stressors since last session.  Continued EMDR by constructing patient's target sequence plan.  Plan: Return again in 2 weeks.  Diagnosis: PTSD (post-traumatic stress  disorder)  MDD (major depressive disorder), recurrent episode, mild  Anxiety state   Collaboration of Care: [facility] psychiatrist can access notes and cln. Will review psychiatrists' notes. Check in with the patient and will see LCSW per availability. Patient agreed with treatment recommendations.   Patient/Guardian was advised Release of Information must be obtained prior to any record release in order to collaborate their care with an outside provider. Patient/Guardian was advised if they have not already done so to contact the registration department to sign all necessary forms in order for us  to release information regarding their care.   Consent: Patient/Guardian gives verbal consent for treatment and assignment of benefits for services provided during this visit. Patient/Guardian expressed understanding and agreed to proceed.   Kimberly KATHEE Husband, LCSW 12/02/2023

## 2023-12-03 DIAGNOSIS — Z1212 Encounter for screening for malignant neoplasm of rectum: Secondary | ICD-10-CM | POA: Diagnosis not present

## 2023-12-03 DIAGNOSIS — Z1211 Encounter for screening for malignant neoplasm of colon: Secondary | ICD-10-CM | POA: Diagnosis not present

## 2023-12-03 NOTE — Progress Notes (Signed)
 Kimberly Hobbs                                          MRN: [id number]   12/03/2023   The [facility] Quality Team Specialist reviewed this patient medical record for the purposes of chart review for care gap closure. The following were reviewed: abstraction for care gap closure-glycemic status assessment.    [facility] Quality Team

## 2023-12-03 NOTE — Progress Notes (Signed)
 Kimberly Hobbs                                          MRN: [id number]   12/03/2023   The [facility] Quality Team Specialist reviewed this patient medical record for the purposes of chart review for care gap closure. The following were reviewed: chart review for care gap closure-kidney health evaluation for diabetes:eGFR  and uACR.    [facility] Quality Team

## 2023-12-04 NOTE — Telephone Encounter (Signed)
 Patient was able to get the medication.

## 2023-12-09 ENCOUNTER — Encounter: Attending: Physical Medicine & Rehabilitation | Admitting: Physical Medicine & Rehabilitation

## 2023-12-09 ENCOUNTER — Encounter: Payer: Self-pay | Admitting: Physical Medicine & Rehabilitation

## 2023-12-09 DIAGNOSIS — M47817 Spondylosis without myelopathy or radiculopathy, lumbosacral region: Secondary | ICD-10-CM | POA: Diagnosis not present

## 2023-12-09 MED ORDER — LIDOCAINE HCL (PF) 2 % IJ SOLN
3.0000 mL | Freq: Once | INTRAMUSCULAR | Status: AC
  Administered 2023-12-09: 3 mL

## 2023-12-09 MED ORDER — LIDOCAINE HCL 1 % IJ SOLN
10.0000 mL | Freq: Once | INTRAMUSCULAR | Status: AC
  Administered 2023-12-09: 10 mL

## 2023-12-09 NOTE — Progress Notes (Signed)
  PROCEDURE RECORD [facility]   Name: Kimberly Hobbs DOB:Dec 13, 1960 MRN: [id number]  Date:12/09/2023  Physician: Prentice Compton, MD    Nurse/CMA: Jama LATHER  Allergies:  Allergies  Allergen Reactions   Duloxetine  Hcl Other (See Comments)    Behavioral changes, sleep disturbances   Iodinated Contrast Media Hives   Penicillins Other (See Comments)    Immune to drug , does not work per patient    Doxycycline  Nausea And Vomiting   Butrans  [Buprenorphine ] Rash    Consent Signed: Yes.    Is patient diabetic? Yes.    CBG today? 102  Pregnant: No. LMP: Patient's last menstrual period was 06/20/2015. (age 20-55)  Anticoagulants: yes (Warfarin) Anti-inflammatory: no Antibiotics: no  Procedure: Right L3-4,L4-5,L5-S1  Position: Prone Start Time: 2:49 pm  End Time: 3:01 pm  Fluoro Time: 51  RN/CMA Jama, CMA Hilary Pundt, CMA    Time 2:31 pm 3:07 pm    BP 117/78 126/74    Pulse 72 70    Respirations 16 16    O2 Sat 96 93    S/S 6 6    Pain Level 6/10 0/10     D/C home with boyfriend, patient A & O X 3, D/C instructions reviewed, and sits independently.

## 2023-12-09 NOTE — Progress Notes (Signed)
 Right lumbar L3, L4 medial branch blocks and L5 dorsal ramus injection under fluoroscopic guidance  Indication: Right Lumbar pain which is not relieved by medication management or other conservative care and interfering with self-care and mobility. Contrast was not utilized for this injection due to contrast allergy   Informed consent was obtained after describing risks and benefits of the procedure with the patient, this includes bleeding, bruising, infection, paralysis and medication side effects. The patient wishes to proceed and has given written consent. The patient was placed in a prone position. The lumbar area was marked and prepped with Betadine. One ML of 1% lidocaine  was injected into each of 3 areas into the skin and subcutaneous tissue. Then a 22-gauge 5 spinal needle was inserted targeting the junction of the Right S1 superior articular process and sacral ala junction. Needle was advanced under fluoroscopic guidance.  Both AP and lateral views were utilized bone contact was made.I. Then a solution containing 2% MPF lidocaine  was injected x0.5 mL. Then the Right L5 superior articular process in transverse process junction was targeted. Bone contact was made.I. Then a solution containing 2% MPF lidocaine  was injected x0.5 mL. Then the Right L4 superior articular process in transverse process junction was targeted. Bone contact was made.  Then a solution containing2% MPF lidocaine  was injected x0.5 mL Patient tolerated procedure well. Post procedure instructions were given. Please refer to post procedure form.   If repeat procedure desired would recommend Valium 5 mg prior to procedure.

## 2023-12-10 DIAGNOSIS — E670 Hypervitaminosis A: Secondary | ICD-10-CM | POA: Diagnosis not present

## 2023-12-10 DIAGNOSIS — Z7901 Long term (current) use of anticoagulants: Secondary | ICD-10-CM | POA: Diagnosis not present

## 2023-12-10 DIAGNOSIS — Z952 Presence of prosthetic heart valve: Secondary | ICD-10-CM | POA: Diagnosis not present

## 2023-12-10 DIAGNOSIS — Z5181 Encounter for therapeutic drug level monitoring: Secondary | ICD-10-CM | POA: Diagnosis not present

## 2023-12-11 ENCOUNTER — Other Ambulatory Visit (HOSPITAL_BASED_OUTPATIENT_CLINIC_OR_DEPARTMENT_OTHER): Payer: Self-pay | Admitting: Physician Assistant

## 2023-12-11 ENCOUNTER — Other Ambulatory Visit (HOSPITAL_BASED_OUTPATIENT_CLINIC_OR_DEPARTMENT_OTHER): Payer: Self-pay | Admitting: Family Medicine

## 2023-12-11 DIAGNOSIS — E670 Hypervitaminosis A: Secondary | ICD-10-CM

## 2023-12-11 DIAGNOSIS — R7989 Other specified abnormal findings of blood chemistry: Secondary | ICD-10-CM

## 2023-12-11 DIAGNOSIS — Z0000 Encounter for general adult medical examination without abnormal findings: Secondary | ICD-10-CM

## 2023-12-14 ENCOUNTER — Encounter: Payer: Self-pay | Admitting: Physical Medicine & Rehabilitation
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
## 2023-12-16 ENCOUNTER — Ambulatory Visit: Admitting: Licensed Clinical Social Worker

## 2023-12-16 ENCOUNTER — Encounter: Payer: Self-pay | Admitting: Licensed Clinical Social Worker

## 2023-12-16 ENCOUNTER — Ambulatory Visit: Attending: Cardiology

## 2023-12-16 DIAGNOSIS — F411 Generalized anxiety disorder: Secondary | ICD-10-CM | POA: Diagnosis not present

## 2023-12-16 DIAGNOSIS — Z952 Presence of prosthetic heart valve: Secondary | ICD-10-CM

## 2023-12-16 DIAGNOSIS — I480 Paroxysmal atrial fibrillation: Secondary | ICD-10-CM

## 2023-12-16 DIAGNOSIS — F330 Major depressive disorder, recurrent, mild: Secondary | ICD-10-CM

## 2023-12-16 DIAGNOSIS — Z5181 Encounter for therapeutic drug level monitoring: Secondary | ICD-10-CM

## 2023-12-16 DIAGNOSIS — F4310 Post-traumatic stress disorder, unspecified: Secondary | ICD-10-CM

## 2023-12-16 LAB — POCT INR: INR: 2.7 (ref 2.0–3.0)

## 2023-12-16 MED ORDER — OXYCODONE-ACETAMINOPHEN 7.5-325 MG PO TABS: 1.0000 | ORAL_TABLET | Freq: Two times a day (BID) | ORAL | 0 refills | Status: DC | PRN

## 2023-12-16 NOTE — Patient Instructions (Signed)
 Continue taking warfarin 1 tablet (5mg ) daily EXCEPT 1/2 tablet on Sundays.  Recheck INR in 2 weeks  Anticoagulation Clinic [phone]  [facility] Clearance request Fax #[phone] *Amiodarone  200 mg daily

## 2023-12-16 NOTE — Progress Notes (Signed)
 THERAPIST PROGRESS NOTE  Progress Review  Virtual Visit via Video Note  I connected with Kimberly Hobbs Hobbs on 12/16/23 at 11:04am by a video enabled telemedicine application and verified that I am speaking with the correct person using two identifiers.  Location: Patient: Address on file Provider: [facility]   I discussed the limitations of evaluation and management by telemedicine and the availability of in person appointments. The patient expressed understanding and agreed to proceed.  I discussed the assessment and treatment plan with the patient. The patient was provided an opportunity to ask questions and all were answered. The patient agreed with the plan and demonstrated an understanding of the instructions.   The patient was advised to call back or seek an in-person evaluation if the symptoms worsen or if the condition fails to improve as anticipated.  I provided 52 minutes of non-face-to-face time during this encounter.   Kimberly Hobbs KATHEE Husband, LCSW   Session Time: 11:04 am--11:56am  Participation Level: Active  Behavioral Response: CasualAlertEuthymic  Type of Therapy: Individual Therapy  Treatment Goals addressed:  Active     BH CCP Acute or Chronic Trauma Reaction     LTG: Recall traumatic events without becoming overwhelmed with negative emotions (Not Progressing)     Start:  07/31/23    Expected End:  12/20/23         STG: I want to be able to say what I want to say. (Progressing)     Start:  07/31/23    Expected End:  12/20/23       Goal Note     12/16/23: Patient reports improvements with her ability to use her voice to establish boundaries with her children.          STG: Kimberly Hobbs will identify coping strategies to deal with trauma memories and the associated emotional reaction (Progressing)     Start:  07/31/23    Expected End:  12/20/23       Goal Note     12/16/23: Patient reports use of her container during the day but it is ineffective at  night.          Cooperate with trauma-focused psychotherapy techniques to reduce emotional reaction to the traumatic event      Start:  07/31/23         Work with Kimberly Hobbs to construct a list of the situations, people, & places that Kimberly Hobbs evoke the most distressing symptoms; suggest that they keep a journal of instances of stress being triggered     Start:  07/31/23         Teach Kimberly Hobbs coping strategies (e.g., writing down thoughts and feelings in a journal; taking deep, slow breaths; calling a support person to talk about memories) to deal with trauma memories and sudden emotional reactions without becoming emotionally nu     Start:  07/31/23         Provide Kimberly Hobbs education on communication patterns in relationships     Start:  07/31/23          ProgressTowards Goals: Progressing  Interventions: Strength-based and Supportive  Summary:  Kimberly Hobbs Hobbs is a 63 y.o. female who presents with little interest, low mood, fatigue, difficulties with sleep, changes to her appetite, falling anxious, uncontrollable worry, restlessness, flashbacks and hypervigilance. Pt was oriented times 5. Pt was cooperative and engaged. Pt denies SI/HI/AVH.    Cln utilized the first half of session to review patients progress. See progress notes documented above.   The clinician  readministered the PHQ-9 and GAD-7 assessments. The patient's anxiety scores remained at 17, and depression scores also decreased from 13 to 12. The patient shares she feels the inability to sleep is impacting her depression and anxiety. Reports during the day she is able to use her coping skills to relax but they are ineffective at night.   Cln readminstered the PCL5 with patients PTSD scored a 26. Reports she feels the duration, severity, and presentation of her trauma sxs has not changed.  Reports she has been coping by placing unwanted memories in her container. Will begin processing through EMDR.   Reports she is not  sleeping. Identifies medication has been changed which she attributes to the sleep changes. Shares her mind races at night unlike during the day. Reports she has tried to sub in melatonin but that is ineffective. Reports she and her psychiatrist have tried other sleep aids but she often has side effects like nightmares.  Cln assessed for the patients night time routine. Reports at night she will watch TV and play games. Shares she will get out of bed and piddle but she is still unable to fall asleep. Identifies she is thinking about the kids and her past. Cln shared guided meditations with the patient to try at night when she cannot sleep (sent via mychart).   Reports improved communication with her children. Shares improved interactions has strengthened their relationships.   Suicidal/Homicidal: Nowithout intent/plan  Therapist Response: Clinician utilized active and supportive reflection to create a safe space for patient to process recent life events. Clinician assessed for current symptoms, safety, stressors since last session.Cln readminstered the PCL5, GAD7, and PHQ9 screener.  Updated patients TP based on progress.   Plan: Return again in 2 weeks.  Diagnosis: PTSD (post-traumatic stress disorder)  MDD (major depressive disorder), recurrent episode, mild  Anxiety state    Collaboration of Care: AEB psychiatrist can access notes and cln. Will review psychiatrists' notes. Check in with the patient and will see LCSW per availability. Patient agreed with treatment recommendations.   Patient/Guardian was advised Release of Information must be obtained prior to any record release in order to collaborate their care with an outside provider. Patient/Guardian was advised if they have not already done so to contact the registration department to sign all necessary forms in order for us  to release information regarding their care.   Consent: Patient/Guardian gives verbal consent for treatment and  assignment of benefits for services provided during this visit. Patient/Guardian expressed understanding and agreed to proceed.   Kimberly Hobbs KATHEE Husband, LCSW 12/16/2023

## 2023-12-18 ENCOUNTER — Ambulatory Visit (INDEPENDENT_AMBULATORY_CARE_PROVIDER_SITE_OTHER)
Admission: RE | Admit: 2023-12-18 | Discharge: 2023-12-18 | Disposition: A | Discharge status: Home / Self Care | Source: Ambulatory Visit | Attending: Physician Assistant | Admitting: Physician Assistant

## 2023-12-18 ENCOUNTER — Encounter (HOSPITAL_BASED_OUTPATIENT_CLINIC_OR_DEPARTMENT_OTHER): Payer: Self-pay | Admitting: Radiology

## 2023-12-18 ENCOUNTER — Ambulatory Visit (INDEPENDENT_AMBULATORY_CARE_PROVIDER_SITE_OTHER)
Admission: RE | Admit: 2023-12-18 | Discharge: 2023-12-18 | Disposition: A | Source: Ambulatory Visit | Attending: Physician Assistant | Admitting: Physician Assistant

## 2023-12-18 DIAGNOSIS — R7989 Other specified abnormal findings of blood chemistry: Secondary | ICD-10-CM

## 2023-12-18 DIAGNOSIS — Z1231 Encounter for screening mammogram for malignant neoplasm of breast: Secondary | ICD-10-CM

## 2023-12-18 DIAGNOSIS — E670 Hypervitaminosis A: Secondary | ICD-10-CM

## 2023-12-18 DIAGNOSIS — Z0000 Encounter for general adult medical examination without abnormal findings: Secondary | ICD-10-CM

## 2023-12-19 ENCOUNTER — Telehealth: Admitting: Psychiatry

## 2023-12-19 ENCOUNTER — Encounter: Payer: Self-pay | Admitting: Psychiatry

## 2023-12-19 DIAGNOSIS — F330 Major depressive disorder, recurrent, mild: Secondary | ICD-10-CM | POA: Diagnosis not present

## 2023-12-19 DIAGNOSIS — F411 Generalized anxiety disorder: Secondary | ICD-10-CM | POA: Diagnosis not present

## 2023-12-19 DIAGNOSIS — F4310 Post-traumatic stress disorder, unspecified: Secondary | ICD-10-CM

## 2023-12-19 DIAGNOSIS — G4700 Insomnia, unspecified: Secondary | ICD-10-CM

## 2023-12-19 DIAGNOSIS — F1320 Sedative, hypnotic or anxiolytic dependence, uncomplicated: Secondary | ICD-10-CM

## 2023-12-19 MED ORDER — CLONAZEPAM 0.5 MG PO TABS: 0.5000 mg | ORAL_TABLET | Freq: Every day | ORAL | 2 refills | Status: AC | PRN

## 2023-12-19 MED ORDER — HYDROXYZINE HCL 50 MG PO TABS: 50.0000 mg | ORAL_TABLET | Freq: Every day | ORAL | 1 refills | Status: DC | PRN

## 2023-12-19 NOTE — Patient Instructions (Signed)
 Continue hydroxyzine  50 mg daily as needed for anxiety Continue clonazepam  0.5 mg at night as needed for insomnia, anxiety  Next appointment-  1/9 at 10 am

## 2023-12-22 ENCOUNTER — Encounter

## 2023-12-23 ENCOUNTER — Encounter: Payer: Self-pay | Admitting: Licensed Clinical Social Worker

## 2023-12-29 NOTE — Telephone Encounter (Signed)
 Called pharmacy spoke to Tammy she stated that the patient does have a refill of the Clonazepam  ready called patient to inform of refill no answer left voicemail for patient to return call to office

## 2023-12-29 NOTE — Telephone Encounter (Signed)
 I believe she's due for a refill of clonazepam  0.5 mg. It was supposed to be a few days ago. Could you check with the pharmacy to confirm and update the patient? thanks

## 2023-12-30 ENCOUNTER — Ambulatory Visit (INDEPENDENT_AMBULATORY_CARE_PROVIDER_SITE_OTHER): Admitting: Cardiology

## 2023-12-30 ENCOUNTER — Ambulatory Visit (INDEPENDENT_AMBULATORY_CARE_PROVIDER_SITE_OTHER): Admitting: Licensed Clinical Social Worker

## 2023-12-30 ENCOUNTER — Telehealth: Payer: Self-pay | Admitting: Cardiology

## 2023-12-30 ENCOUNTER — Ambulatory Visit

## 2023-12-30 DIAGNOSIS — Z952 Presence of prosthetic heart valve: Secondary | ICD-10-CM | POA: Diagnosis not present

## 2023-12-30 DIAGNOSIS — F330 Major depressive disorder, recurrent, mild: Secondary | ICD-10-CM

## 2023-12-30 DIAGNOSIS — F4310 Post-traumatic stress disorder, unspecified: Secondary | ICD-10-CM | POA: Diagnosis not present

## 2023-12-30 DIAGNOSIS — F411 Generalized anxiety disorder: Secondary | ICD-10-CM

## 2023-12-30 DIAGNOSIS — Z5181 Encounter for therapeutic drug level monitoring: Secondary | ICD-10-CM | POA: Diagnosis not present

## 2023-12-30 LAB — POCT INR: INR: 3 (ref 2.0–3.0)

## 2023-12-30 NOTE — Telephone Encounter (Signed)
 Pt called to cancel today's appt due to not feeling well and having a fever. Pt stated that she did check her INR today and it was 3.5. Appt rescheduled for 11/25. Please advise

## 2023-12-30 NOTE — Progress Notes (Signed)
 THERAPIST PROGRESS NOTE  Virtual Visit via Video Note  I connected with Kimberly Hobbs on 12/30/23 at 11:00 AM EST by a video enabled telemedicine application and verified that I am speaking with the correct person using two identifiers.  Location: Patient: Address on file  Provider: [facility]   I discussed the limitations of evaluation and management by telemedicine and the availability of in person appointments. The patient expressed understanding and agreed to proceed.   I discussed the assessment and treatment plan with the patient. The patient was provided an opportunity to ask questions and all were answered. The patient agreed with the plan and demonstrated an understanding of the instructions.   The patient was advised to call back or seek an in-person evaluation if the symptoms worsen or if the condition fails to improve as anticipated.  I provided 42 minutes of non-face-to-face time during this encounter.   Kimberly KATHEE Husband, Kimberly Hobbs    Session Time: 11:05-11:47am  Participation Level: Active  Behavioral Response: CasualAlertEuthymic  Type of Therapy: Individual Therapy  Treatment Goals addressed:  Active     BH CCP Acute or Chronic Trauma Reaction     LTG: Recall traumatic events without becoming overwhelmed with negative emotions (Not Progressing)     Start:  07/31/23    Expected End:  12/20/23         STG: I want to be able to say what I want to say. (Progressing)     Start:  07/31/23    Expected End:  12/20/23       Goal Note     12/16/23: Patient reports improvements with her ability to use her voice to establish boundaries with her children.          STG: Demetric will identify coping strategies to deal with trauma memories and the associated emotional reaction (Progressing)     Start:  07/31/23    Expected End:  12/20/23       Goal Note     12/16/23: Patient reports use of her container during the day but it is ineffective at night.           Cooperate with trauma-focused psychotherapy techniques to reduce emotional reaction to the traumatic event      Start:  07/31/23         Work with Kimberly to construct a list of the situations, people, & places that [place] evoke the most distressing symptoms; suggest that they keep a journal of instances of stress being triggered     Start:  07/31/23         Teach Kimberly coping strategies (e.g., writing down thoughts and feelings in a journal; taking deep, slow breaths; calling a support person to talk about memories) to deal with trauma memories and sudden emotional reactions without becoming emotionally nu     Start:  07/31/23         Provide Kimberly education on communication patterns in relationships     Start:  07/31/23            ProgressTowards Goals: Progressing  Interventions: Assertiveness Training, Supportive, Reframing, and Other: EMDR  Summary: Kimberly Hobbs is a 63 y.o. female who presents with little interest, low mood, fatigue, difficulties with sleep, changes to her appetite, falling anxious, uncontrollable worry, restlessness, flashbacks and hypervigilance. Pt was oriented times 5. Pt was cooperative and engaged. Pt denies SI/HI/AVH.   Patient presented to session tearful identifying recent miscommunication between her and both daughters.  Clinician  worked with patient to consider all perspectives in an effort to address accountability on both parts.  Patient demonstrated resistance in doing so reflecting on unhealthy patterns.  Clinician and patient reflected on use of assertiveness to establish healthy boundaries.  Clinician explored her understanding of forgiveness related to parenting relationships with both of her parents.  Through processing with EMDR, the patient was able to understand she can establish a better understanding of power/control by recognizing she does not need to carry the weight of others perspectives/trauma.  Reports her subjective  units of distress decreased from a score of 10 to a score of 0.  Patient was able to instill the belief that was then, I am safe now.  Patient reflected on ways in which she can continue to ground herself and remind herself of present moments.  Suicidal/Homicidal: Nowithout intent/plan  Therapist Response: Clinician utilized active and supportive reflection to create a safe space for patient to process recent life events. Clinician assessed for current symptoms, safety, stressors since last session.  Continued EMDR by processing her target sequence plan.  Reflected on establishment of healthy boundaries through use of assertive communication.  Plan: Return again in 2 weeks.  Diagnosis: PTSD (post-traumatic stress disorder)  MDD (major depressive disorder), recurrent episode, mild  Anxiety state   Collaboration of Care: AEB psychiatrist can access notes and cln. Will review psychiatrists' notes. Check in with the patient and will see Kimberly Hobbs per availability. Patient agreed with treatment recommendations.   Patient/Guardian was advised Release of Information must be obtained prior to any record release in order to collaborate their care with an outside provider. Patient/Guardian was advised if they have not already done so to contact the registration department to sign all necessary forms in order for us  to release information regarding their care.   Consent: Patient/Guardian gives verbal consent for treatment and assignment of benefits for services provided during this visit. Patient/Guardian expressed understanding and agreed to proceed.   Kimberly KATHEE Husband, Kimberly Hobbs 12/30/2023

## 2024-01-01 ENCOUNTER — Encounter: Payer: Self-pay | Admitting: Physical Medicine & Rehabilitation

## 2024-01-01 ENCOUNTER — Encounter: Attending: Physical Medicine & Rehabilitation | Admitting: Physical Medicine & Rehabilitation

## 2024-01-01 VITALS — BP 112/71 | HR 75 | Ht 65.0 in | Wt 209.0 lb

## 2024-01-01 DIAGNOSIS — Z5181 Encounter for therapeutic drug level monitoring: Secondary | ICD-10-CM | POA: Insufficient documentation

## 2024-01-01 DIAGNOSIS — G2581 Restless legs syndrome: Secondary | ICD-10-CM | POA: Insufficient documentation

## 2024-01-01 DIAGNOSIS — G894 Chronic pain syndrome: Secondary | ICD-10-CM | POA: Insufficient documentation

## 2024-01-01 DIAGNOSIS — G8929 Other chronic pain: Secondary | ICD-10-CM | POA: Insufficient documentation

## 2024-01-01 DIAGNOSIS — Z79891 Long term (current) use of opiate analgesic: Secondary | ICD-10-CM | POA: Insufficient documentation

## 2024-01-01 DIAGNOSIS — M545 Low back pain, unspecified: Secondary | ICD-10-CM | POA: Diagnosis present

## 2024-01-01 MED ORDER — DIAZEPAM 5 MG PO TABS: 5.0000 mg | ORAL_TABLET | Freq: Once | ORAL | 0 refills | Status: AC

## 2024-01-01 MED ORDER — DIAZEPAM 5 MG PO TABS: 5.0000 mg | ORAL_TABLET | Freq: Once | ORAL | 0 refills | Status: DC

## 2024-01-01 MED ORDER — PREGABALIN 50 MG PO CAPS: 50.0000 mg | ORAL_CAPSULE | Freq: Every day | ORAL | 2 refills | Status: DC

## 2024-01-01 NOTE — Progress Notes (Signed)
 Subjective:    Kimberly Hobbs ID: Kimberly Hobbs, female    DOB: [birth year], 63 y.o.   MRN: [id number]  HPI  Kimberly Hobbs is a 63 y.o. year old female  who  has a past medical history of CKD stage 3 due to type 2 diabetes mellitus (HCC) (05/15/2021), COVID-19, Hyperlipidemia, Hypertension, Pain in both lower extremities (07/23/2019), and Rheumatic heart disease.   They are presenting to PM&R clinic as a new Kimberly Hobbs for pain management evaluation. They were referred by Kimberly Hobbs for treatment of low back pain.  She cant stand for long. Has to use a walker.  Pain is severe if she walks without this. Pain started about 2 years ago but is worsening. 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  Pain is mostly on the right side of her lower back. Sometimes both sides. It doesn't shoot down her legs.  Lying down helps the pain.  She reports her activities are very limited by her pain.  She has been taking hydrocodone  5/325 1 pain is very severe. History of prosthetic valves, schedule for ablation for A-fib       Red flag symptoms: No red flags for back pain endorsed in Hx or ROS   Medications tried: Topical medications - doesn't recall name, didn't help Nsaids- cannot take for cardiac reasons Tylenol - doesn't help Opiates  Hydrocodone  -she uses sparingly when pain is very severe, this does help control her pain Gabapentin  / Lyrica  Has not tried  Tramadol -does not remember TCAs  - Denies  She is currently on Celexa  for her anxiety, this medication visit has been working very well for her Robaxin  helps pain   Other treatments: PT/OT  has not tried, in afib waiting on heart ablation  TENs unit - has not tried  Injections-denies Surgery - denies      Interal History 62/5/24   Kimberly Hobbs is here for follow-up regarding her chronic pain.  She reports she continues to have right-sided back pain.  She also has some aching pain in her  legs bilaterally.  She reports she has her cardiac ablation procedures 09/05/2022 so after this time she would be interested in getting facet joint injections.  Pain is worsened with standing and she continues to use her walker for ambulation.  Pain continues to be improved with lying down.  Vicodin 7.5 is helping her control this pain.  She tried to use this sparingly only when pain is very severe and 30 tablets has lasted her about a month and a half.  She is not having any side effects with this medication.   Interal History 62/11/2022 Kimberly Hobbs is here for follow-up regarding her right-sided back pain with aching pain in her legs.  She continues to use occasional Vicodin 7.5 which helps keep her pain controlled when it is very severe.  She tries to use this sparingly.  She has been started on gabapentin  100 mg at bedtime which has greatly reduced her bilateral leg pain.  She reports her Celexa  was discontinued because it was no longer helping.  She says she is currently not on any antidepressant medicines for anxiety.  She has had a few sessions of physical therapy but has not had significant benefits at this time. Kimberly Hobbs has been using occasional Flexeril  10 mg with benefit, she does not use this when she takes for Norco.       Interal History 62/19/24 [name] is here for follow-up regarding her right-sided back pain and leg pain.  She continues to use Vicodin 7.5 mg sparingly when the pain is severe.  She still has most of her pills from her last refill.  Leg pain continues to be improved since starting gabapentin .  She stopped using Cymbalta , says she did not tolerate this and made her feel bad.  She reports pain is not significantly improved after physical therapy.     Interal History 62/30/24 Kimberly Hobbs reports that she has been having worse pain in her lower back more on the right side.  When she does activity she will have pain all the way across her back.  Hydrocodone  not helping pain is  much as it previously did.  Kimberly Hobbs is currently following with cardiology working on treatment of her arrhythmia.  She is now restarted on amiodarone .  She had a dual-chamber ICD implanted 02/10/2023.  Interval history 62/27/2025 Kimberly Hobbs is here for follow-up regarding her back pain.  She reports her back pain is doing better, not having help her husband as much as he has been more active himself.  He does have another surgery scheduled.  She is only requiring use of oxycodone  sparingly and it is helping her pain.   Interval history 62/27/2025 Kimberly Hobbs reports her last use of oxycodone  was yesterday.  She has been using oxycodone  sparingly, reports it decreases her pain but is not strong enough to fully control her back pain.  She is limited in household activities due to her back pain.  She is not able to do a lot of cooking or cleaning and has to rest a lot because of her pain.  Pain is present all the time but is worsened with activity.  Interval history 62/22/25 Kimberly Hobbs is here for follow-up regarding her chronic pain.  She was not able to tolerate buprenorphine  due to body wide rash.  Oxycodone  is not helping as much as it was previously.  She typically takes this at night but sometimes takes it twice a day.  No side effects with the medication.  Her pain makes it difficult to do chores and other activities at home.   Interval history 62/15/25  Reports chronic lower back pain worsened for approximately 1 year. Pain worsened when lifting mother, which significantly aggravated symptoms. Pain primarily localized to lower back without radiation down legs. Pain worsens with activity and prolonged standing. Reports being in tears when getting up in morning and cooking breakfast. Pain improves when off feet or sitting with pillows for support.  Percocet - currently taking once nightly at bedtime, prescribed every 12 hours but Kimberly Hobbs only using at night. Notes not waking up in pain as much since  starting medication.  Pt has not used it in the day, so she is not sure if it will help her pain at this time.    Gabapentin  100mg  - taking once nightly about 1 hour before bed.  Initially helpful but now reports no benefit. Originally prescribed for restless leg syndrome. Restless leg syndrome - ongoing, keeps awake more than back pain. Reports pickle juice provides some relief.  Interval History 10/31/23 Reports continued chronic low back pain, described as unbearable. Pain is significantly worse with standing and walking, but is fine when lying down. Pain has become worse since the last MRI a year and a half ago. Unable to walk around stores like [employer] without a mobility cart. If no cart is available, has to leave the store. Unable to  stand in the kitchen to cook a meal. Pain is located across the low back, but is worse on the right side. Denies the pain shoots down the legs.  Current oxycodone  medication is taken at night and helps with sleep but provides limited daytime pain relief. She usually takes it at night only.   Discussed the possibility of a back injection. The Kimberly Hobbs understands they may need to coordinate with their cardiologist regarding stopping Coumadin , but it was noted that for this specific procedure, stopping the blood thinner may not be necessary. The Kimberly Hobbs is willing to proceed with bridging therapy (stomach injections) if required.  The Kimberly Hobbs inquired about obtaining a scooter for community mobility.  Interval History 01/01/24 Reports recent diagnostic nerve block procedure was very painful. Experienced significant anxiety before and during the procedure. After the procedure, experienced approximately 90% pain relief for short time. Expressed willingness to proceed with a second diagnostic block as required.  Current Pain and Medication Management - Back pain has returned since the temporary relief from the block. - Pain is most severe at night. - Uses Percocet  7.5 mg at night for pain. Currently taking 1.5 tablets (11.25 mg) for adequate relief.  Restless Legs Syndrome (RLS) - Reports severe RLS, occurring nightly and significantly disrupting sleep. - Currently taking gabapentin  100 mg at night without good benefit  Pain Inventory Average Pain 7 Pain Right Now 6 My pain is sharp and stabbing, aching, dull, burning In the last 24 hours, has pain interfered with the following? General activity 10 Relation with others 0 Enjoyment of life 7 What TIME of day is your pain at its worst? Night Sleep (in general) Fair  Pain is worse with: walking, bending standing Pain improves with: Medication Relief from Meds: 8  Family History  Problem Relation Age of Onset  . Drug abuse Mother   . Alcohol abuse Mother   . Anxiety disorder Mother   . Cancer Mother   . Hypertension Mother   . Diabetes Mother   . Hyperlipidemia Mother   . Heart disease Mother   . Drug abuse Father   . Alcohol abuse Father   . Anxiety disorder Father   . Cancer Father   . Hypertension Father   . Diabetes Father   . Heart disease Father   . Hypertension Sister   . Hypertension Brother   . Hypertension Brother   . Schizophrenia Maternal Grandmother   . Cancer Daughter   . COPD Daughter   . Healthy Child   . Healthy Child   . Healthy Child   . Healthy Child   . Breast cancer Neg Hx    Social History   Socioeconomic History  . Marital status: Divorced    Spouse name: Not on file  . Number of children: 4  . Years of education: Not on file  . Highest education level: GED or equivalent  Occupational History  . 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 Smoking status: Former    Current packs/day: 0.00    Average packs/day: 1.6 packs/day for 50.0 years (80.0 ttl pk-yrs)    Types: Cigarettes    Start date: 12/23/1976    Quit date: 12/23/2016    Years since quitting: 7.0    Passive exposure: Past  .  Smokeless tobacco: Never  . Tobacco comments:    Former smoker 01/24/23  Vaping Use  . Vaping status: Never Used  Substance and Sexual Activity  .  Alcohol use: Never  . Drug use: Never  . Sexual activity: Not Currently  Other Topics Concern  . Not on file  Social History Narrative   ** Merged History Encounter ** Epworth Sleepiness Scale = 4 (as of 06/28/2015)   Right handed    Social Drivers of Health   Financial Resource Strain: Low Risk  (10/14/2022)   Overall Financial Resource Strain (CARDIA)   . Difficulty of Paying Living Expenses: Not hard at all  Food Insecurity: No Food Insecurity (05/12/2023)   Hunger Vital Sign   . Worried About Programme Researcher, Broadcasting/film/video in the Last Year: Never true   . Ran Out of Food in the Last Year: Never true  Transportation Needs: No Transportation Needs (05/12/2023)   PRAPARE - Transportation   . Lack of Transportation (Medical): No   . Lack of Transportation (Non-Medical): No  Physical Activity: Inactive (10/14/2022)   Exercise Vital Sign   . Days of Exercise per Week: 0 days   . Minutes of Exercise per Session: 0 min  Stress: Stress Concern Present (10/14/2022)   [employer] of Occupational Health - Occupational Stress Questionnaire   . Feeling of Stress : To some extent  Social Connections: Moderately Isolated (10/14/2022)   Social Connection and Isolation Panel   . Frequency of Communication with Friends and Family: More than three times a week   . Frequency of Social Gatherings with Friends and Family: Never   . Attends Religious Services: Never   . Active Member of Clubs or Organizations: No   . Attends Banker Meetings: Never   . Marital Status: Living with partner   Past Surgical History:  Procedure Laterality Date  . ATRIAL FIBRILLATION ABLATION N/A 09/05/2022   Procedure: ATRIAL FIBRILLATION ABLATION;  Surgeon: Inocencio Soyla Lunger, MD;  [facility];  Service: Cardiovascular;  Laterality: N/A;  .  ATRIAL FIBRILLATION ABLATION N/A 07/24/2023   Procedure: ATRIAL FIBRILLATION ABLATION;  Surgeon: Inocencio Soyla Lunger, MD;  [facility]: [facility];  Service: Cardiovascular;  Laterality: N/A;  . CARDIAC CATHETERIZATION    . CARDIAC VALVE REPLACEMENT    . CARDIOVERSION N/A 04/02/2022   Procedure: CARDIOVERSION;  Surgeon: Elmira Newman PARAS, MD;  [facility]: [facility];  Service: Cardiovascular;  Laterality: N/A;  . CARDIOVERSION N/A 04/27/2022   Procedure: CARDIOVERSION;  Surgeon: Elmira Newman PARAS, MD;  [facility]: [facility];  Service: Cardiovascular;  Laterality: N/A;  . CARDIOVERSION N/A 03/27/2023   Procedure: CARDIOVERSION;  Surgeon: Alvan Ronal BRAVO, MD;  [facility];  Service: Cardiovascular;  Laterality: N/A;  . CARDIOVERSION N/A 05/06/2023   Procedure: CARDIOVERSION;  Surgeon: Sheena Pugh, DO;  [facility];  Service: Cardiovascular;  Laterality: N/A;  . CARDIOVERSION N/A 06/02/2023   Procedure: CARDIOVERSION;  Surgeon: Jeffrie Oneil BROCKS, MD;  [facility];  Service: Cardiovascular;  Laterality: N/A;  . CHOLECYSTECTOMY    . CORONARY ARTERY BYPASS GRAFT    . CORONARY/GRAFT ANGIOGRAPHY N/A 08/18/2018   Procedure: CORONARY/GRAFT ANGIOGRAPHY;  Surgeon: Elmira Newman PARAS, MD;  [facility];  Service: Cardiovascular;  Laterality: N/A;  . ELECTROPHYSIOLOGY STUDY N/A 02/04/2023   Procedure: ELECTROPHYSIOLOGY STUDY;  Surgeon: Inocencio Soyla Lunger, MD;  [facility];  Service: Cardiovascular;  Laterality: N/A;  . ICD IMPLANT N/A 02/10/2023   Procedure: ICD IMPLANT;  Surgeon: Inocencio Soyla Lunger, MD;  [facility];  Service: Cardiovascular;  Laterality: N/A;  . LEG SURGERY  metal plate in leg  . RIGHT HEART CATH AND CORONARY/GRAFT ANGIOGRAPHY N/A 07/21/2018   Procedure: RIGHT HEART CATH AND CORONARY/GRAFT ANGIOGRAPHY;  Surgeon: Elmira Newman PARAS, MD;  [facility];   Service: Cardiovascular;  Laterality: N/A;  . TEE WITHOUT CARDIOVERSION N/A 07/27/2015   Procedure: TRANSESOPHAGEAL ECHOCARDIOGRAM (TEE);  Surgeon: Jerel Balding, MD;  Location[facility];  Service: Cardiovascular;  Laterality: N/A;  . TEE WITHOUT CARDIOVERSION N/A 07/21/2018   Procedure: TRANSESOPHAGEAL ECHOCARDIOGRAM (TEE);  Surgeon: Elmira Newman PARAS, MD;  Location: [facility] ENDOSCOPY;  Service: Cardiovascular;  Laterality: N/A;  . TONSILLECTOMY AND ADENOIDECTOMY    . TRANSESOPHAGEAL ECHOCARDIOGRAM (CATH LAB) N/A 05/06/2023   Procedure: TRANSESOPHAGEAL ECHOCARDIOGRAM;  Surgeon: Sheena Pugh, DO;  [facility];  Service: Cardiovascular;  Laterality: N/A;  . TRANSESOPHAGEAL ECHOCARDIOGRAM (CATH LAB) N/A 06/02/2023   Procedure: TRANSESOPHAGEAL ECHOCARDIOGRAM;  Surgeon: Jeffrie Oneil BROCKS, MD;  [facility];  Service: Cardiovascular;  Laterality: N/A;   Past Surgical History:  Procedure Laterality Date  . ATRIAL FIBRILLATION ABLATION N/A 09/05/2022   Procedure: ATRIAL FIBRILLATION ABLATION;  Surgeon: Inocencio Soyla Lunger, MD;  [facility];  Service: Cardiovascular;  Laterality: N/A;  . ATRIAL FIBRILLATION ABLATION N/A 07/24/2023   Procedure: ATRIAL FIBRILLATION ABLATION;  Surgeon: Inocencio Soyla Lunger, MD;  [facility];  Service: Cardiovascular;  Laterality: N/A;  . CARDIAC CATHETERIZATION    . CARDIAC VALVE REPLACEMENT    . CARDIOVERSION N/A 04/02/2022   Procedure: CARDIOVERSION;  Surgeon: Elmira Newman PARAS, MD;  [facility];  Service: Cardiovascular;  Laterality: N/A;  . CARDIOVERSION N/A 04/27/2022   Procedure: CARDIOVERSION;  Surgeon: Elmira Newman PARAS, MD;  [facility];  Service: Cardiovascular;  Laterality: N/A;  . CARDIOVERSION N/A 03/27/2023   Procedure: CARDIOVERSION;  Surgeon: Alvan Ronal BRAVO, MD;  [facility];  Service: Cardiovascular;  Laterality: N/A;  . CARDIOVERSION N/A  05/06/2023   Procedure: CARDIOVERSION;  Surgeon: Sheena Pugh, DO;  [facility];  Service: Cardiovascular;  Laterality: N/A;  . CARDIOVERSION N/A 06/02/2023   Procedure: CARDIOVERSION;  Surgeon: Jeffrie Oneil BROCKS, MD;  [facility];  Service: Cardiovascular;  Laterality: N/A;  . CHOLECYSTECTOMY    . CORONARY ARTERY BYPASS GRAFT    . CORONARY/GRAFT ANGIOGRAPHY N/A 08/18/2018   Procedure: CORONARY/GRAFT ANGIOGRAPHY;  Surgeon: Elmira Newman PARAS, MD;  [facility];  Service: Cardiovascular;  Laterality: N/A;  . ELECTROPHYSIOLOGY STUDY N/A 02/04/2023   Procedure: ELECTROPHYSIOLOGY STUDY;  Surgeon: Inocencio Soyla Lunger, MD;  [facility];  Service: Cardiovascular;  Laterality: N/A;  . ICD IMPLANT N/A 02/10/2023   Procedure: ICD IMPLANT;  Surgeon: Inocencio Soyla Lunger, MD;  [facility];  Service: Cardiovascular;  Laterality: N/A;  . LEG SURGERY     metal plate in leg  . RIGHT HEART CATH AND CORONARY/GRAFT ANGIOGRAPHY N/A 07/21/2018   Procedure: RIGHT HEART CATH AND CORONARY/GRAFT ANGIOGRAPHY;  Surgeon: Elmira Newman PARAS, MD;  [facility];  Service: Cardiovascular;  Laterality: N/A;  . TEE WITHOUT CARDIOVERSION N/A 07/27/2015   Procedure: TRANSESOPHAGEAL ECHOCARDIOGRAM (TEE);  Surgeon: Jerel Balding, MD;  Location: [facility] ENDOSCOPY;  Service: Cardiovascular;  Laterality: N/A;  . TEE WITHOUT CARDIOVERSION N/A 07/21/2018   Procedure: TRANSESOPHAGEAL ECHOCARDIOGRAM (TEE);  Surgeon: Elmira Newman PARAS, MD;  Location: [facility] ENDOSCOPY;  Service: Cardiovascular;  Laterality: N/A;  . TONSILLECTOMY AND ADENOIDECTOMY    . TRANSESOPHAGEAL ECHOCARDIOGRAM (CATH  LAB) N/A 05/06/2023   Procedure: TRANSESOPHAGEAL ECHOCARDIOGRAM;  Surgeon: Sheena Pugh, DO;  [facility];  Service: Cardiovascular;  Laterality: N/A;  . TRANSESOPHAGEAL ECHOCARDIOGRAM (CATH LAB) N/A 06/02/2023   Procedure: TRANSESOPHAGEAL  ECHOCARDIOGRAM;  Surgeon: Jeffrie Oneil BROCKS, MD;  [facility];  Service: Cardiovascular;  Laterality: N/A;   Past Medical History:  Diagnosis Date  . Allergy  ?  . Arthritis    Back  . CHF (congestive heart failure) (HCC)   . 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 COPD (chronic obstructive pulmonary disease) (HCC)   . COVID-19   . GERD (gastroesophageal reflux disease)   . H/O mitral valve replacement with mechanical #25 mechanical SJM 01/01/2017) 01/01/2017  . Hyperlipidemia   . Hypertension   . Pain in both lower extremities 07/23/2019  . Paroxysmal atrial fibrillation (HCC) 06/29/2015  . Rheumatic heart disease    MS/ AS  . Sleep apnea   . 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 01/01/2017 01/01/2017   BP 112/71 (BP Location: Left Arm, Kimberly Hobbs Position: Sitting, Cuff Size: Large)   Pulse 75   Ht 5' 5 (1.651 m)   Wt 209 lb (94.8 kg)   LMP 06/20/2015   SpO2 97%   BMI 34.78 kg/m   Opioid Risk Score:   Fall Risk Score:  `1  Depression screen [facility] 2/9     12/16/2023   11:07 AM 10/31/2023    2:09 PM 10/03/2023    9:28 AM 07/31/2023    9:03 AM 04/14/2023    1:38 PM 01/07/2023    9:27 AM 12/23/2022   10:25 AM  Depression screen PHQ 2/9  Decreased Interest  0 0   1 0  Down, Depressed, Hopeless  0 0   1 0  PHQ - 2 Score  0 0   2 0  Altered sleeping  0     3  Tired, decreased energy  0     1  Change in appetite  0     3  Feeling bad or failure about yourself   0     0  Trouble concentrating  0     0  Moving slowly or fidgety/restless  0     0  Suicidal thoughts  0     0  PHQ-9 Score  0      7   Difficult doing work/chores  Somewhat difficult     Not difficult at all     Information is confidential and restricted. Go to Review Flowsheets to unlock data.   Data saved with a previous flowsheet row definition     Review of Systems  Musculoskeletal:  Positive for back pain.  All other systems reviewed and are  negative.      Objective:   Physical Exam  Gen: no distress, normal appearing HEENT: oral mucosa pink and moist, NCAT Cardio: Reg rate Chest: normal effort, normal rate of breathing Abd: soft, non-distended Ext: no edema Psych: pleasant, normal affect Skin: intact Neuro: Alert and awake, follows commands, cranial nerves II through XII intact, intact insight and judgment, speech and language intact Strength 5 out of 5 in all 4 extremities Sensation intact light touch in all 4 extremities No ankle clonus Musculoskeletal:  Lumbar b/l tenderness to palpation Slump negative b/l   Prior exam:  Mild tenderness noted over the right SI joint   MRI L-spine 1/17/2024IMPRESSION: 1. L4-L5 moderate facet arthropathy, which can  be a cause of back pain. Narrowing of the lateral recesses at this level could affect the descending L5 nerve roots. 2. No spinal canal stenosis or neural foraminal narrowing.       Assessment & Plan:    Worsening chronic low back pain, consistent with L4-5 facet arthropathy, worse on the right. This correlates with the MRI from approximately 1.5 years ago which showed moderate facet arthritis, most significant at L4-5. The pain is severely impacting function and quality of life. -No improvement with Tylenol , unable to take NSAIDs due to cardiac history - Intermittent right SI joint tenderness has been noted, continue to monitor -Pain is primarily axial in her lower back, suspect this may be facet joint mediated -ORT low -TENS unit, nexwave device ordered prior visit -Discontinued Norco 7.5 twice daily prior visit -Cymbalta  20 mg daily- She did not tolerate -She reports Physical Therapy provided minimal improvement -Pain agreement completed prior visit -Benefit with Right lumbar L3, L4 medial branch blocks and L5 dorsal ramus injection Dr. Carilyn - refer for repeat procedure - Valium 5 mg, 1 tablet, prescribed to be taken prior to the next procedure for  anxiety -Continue UDS, Pill Counts, PDMP monitoring  -Pill counts consistent today -Oxycodone /acetaminophen : On next refill, consider change prescription to 10 mg strength to be taken as one pill, instead of 1.5 pills of the 7.5 mg strength. - Butrans  caused rash  Ambulation Difficulty -Prior visit Refer to physical therapy for a mobility assessment to evaluate the need for a scooter or other assistive device. A referral will be sent to a location in [facility].   Chronic anxiety, denies SI or HI -No longer on Celexa , Cymbalta - she says it made her feel bad -Continue follow-up with mental health as directed  Restless Legs Syndrome:     - Stop gabapentin .     - Start pregabalin (Lyrica) 50 mg once daily at bedtime. Dose adjusted by CKD     - Counseled to trial for 2-3 weeks and to call if not effective, as the dose may need to be increased.  CKD      -07/01/23 Cr 1.44   Prior warnings -2nd warning today forgot pills for count 10/03/23 -She forgot pills prior visit- and had used codeine  cough syrup and  warning was given at the time

## 2024-01-01 NOTE — Progress Notes (Deleted)
   Subjective:    Patient ID: Kimberly Hobbs, female    DOB: [birth year], 63 y.o.   MRN: [id number]  HPI     Review of Systems     Objective:   Physical Exam        Assessment & Plan:

## 2024-01-02 ENCOUNTER — Other Ambulatory Visit: Payer: Self-pay | Admitting: Physical Medicine & Rehabilitation

## 2024-01-07 ENCOUNTER — Encounter: Payer: Self-pay | Admitting: Cardiology

## 2024-01-10 ENCOUNTER — Other Ambulatory Visit: Payer: Self-pay | Admitting: Cardiology

## 2024-01-11 LAB — DRUG TOX MONITOR 1 W/CONF, ORAL FLD
AMINOCLONAZEPAM: 2.64 ng/mL — ABNORMAL HIGH (ref <0.50)
Alprazolam: NEGATIVE ng/mL (ref <0.50)
Amphetamines: NEGATIVE ng/mL (ref <10)
Barbiturates: NEGATIVE ng/mL (ref <10)
Benzodiazepines: POSITIVE ng/mL — AB (ref <0.50)
Buprenorphine: NEGATIVE ng/mL (ref <0.10)
Chlordiazepoxide: NEGATIVE ng/mL (ref <0.50)
Clonazepam: 0.61 ng/mL — ABNORMAL HIGH (ref <0.50)
Cocaine: NEGATIVE ng/mL (ref <5.0)
Codeine: NEGATIVE ng/mL (ref <2.5)
Diazepam: NEGATIVE ng/mL (ref <0.50)
Dihydrocodeine: NEGATIVE ng/mL (ref <2.5)
Fentanyl: NEGATIVE ng/mL (ref <0.10)
Flunitrazepam: NEGATIVE ng/mL (ref <0.50)
Flurazepam: NEGATIVE ng/mL (ref <0.50)
Heroin Metabolite: NEGATIVE ng/mL (ref <1.0)
Hydrocodone: NEGATIVE ng/mL (ref <2.5)
Hydromorphone: NEGATIVE ng/mL (ref <2.5)
Lorazepam: NEGATIVE ng/mL (ref <0.50)
MARIJUANA: NEGATIVE ng/mL (ref <2.5)
MDMA: NEGATIVE ng/mL (ref <10)
Meprobamate: NEGATIVE ng/mL (ref <2.5)
Methadone: NEGATIVE ng/mL (ref <5.0)
Midazolam: NEGATIVE ng/mL (ref <0.50)
Morphine: NEGATIVE ng/mL (ref <2.5)
Nicotine Metabolite: NEGATIVE ng/mL (ref <5.0)
Nordiazepam: NEGATIVE ng/mL (ref <0.50)
Norhydrocodone: NEGATIVE ng/mL (ref <2.5)
Noroxycodone: 41.7 ng/mL — ABNORMAL HIGH (ref <2.5)
Opiates: POSITIVE ng/mL — AB (ref <2.5)
Oxazepam: NEGATIVE ng/mL (ref <0.50)
Oxycodone: 189.7 ng/mL — ABNORMAL HIGH (ref <2.5)
Oxymorphone: NEGATIVE ng/mL (ref <2.5)
Phencyclidine: NEGATIVE ng/mL (ref <10)
Tapentadol: NEGATIVE ng/mL (ref <5.0)
Temazepam: NEGATIVE ng/mL (ref <0.50)
Tramadol: NEGATIVE ng/mL (ref <5.0)
Triazolam: NEGATIVE ng/mL (ref <0.50)
Zolpidem: NEGATIVE ng/mL (ref <5.0)

## 2024-01-11 LAB — DRUG TOX ALC METAB W/CON, ORAL FLD: Alcohol Metabolite: NEGATIVE ng/mL (ref <25)

## 2024-01-12 NOTE — Telephone Encounter (Signed)
 Refill request for warfarin:  Last INR was 3.0 on 12/30/23 Next INR due 01/13/24 LOV was 11/20/23  Refill approved.

## 2024-01-13 ENCOUNTER — Ambulatory Visit: Attending: Cardiology

## 2024-01-13 ENCOUNTER — Ambulatory Visit

## 2024-01-13 DIAGNOSIS — Z952 Presence of prosthetic heart valve: Secondary | ICD-10-CM

## 2024-01-13 DIAGNOSIS — I480 Paroxysmal atrial fibrillation: Secondary | ICD-10-CM

## 2024-01-13 DIAGNOSIS — Z5181 Encounter for therapeutic drug level monitoring: Secondary | ICD-10-CM | POA: Diagnosis not present

## 2024-01-13 LAB — POCT INR: INR: 2.6 (ref 2.0–3.0)

## 2024-01-13 NOTE — Patient Instructions (Signed)
 Continue taking warfarin 1 tablet (5mg ) daily EXCEPT 1/2 tablet on Sundays.   INR in 2 weeks in office appt [facility] [phone]  [facility] Clearance request Fax #[phone] *Amiodarone  200 mg daily

## 2024-01-19 ENCOUNTER — Ambulatory Visit (INDEPENDENT_AMBULATORY_CARE_PROVIDER_SITE_OTHER): Admitting: Licensed Clinical Social Worker

## 2024-01-19 DIAGNOSIS — F4310 Post-traumatic stress disorder, unspecified: Secondary | ICD-10-CM | POA: Diagnosis not present

## 2024-01-19 DIAGNOSIS — F411 Generalized anxiety disorder: Secondary | ICD-10-CM

## 2024-01-19 DIAGNOSIS — F330 Major depressive disorder, recurrent, mild: Secondary | ICD-10-CM

## 2024-01-19 NOTE — Progress Notes (Signed)
 THERAPIST PROGRESS NOTE  Virtual Visit via Video Note  I connected with Kimberly Hobbs on 01/19/24 at 10:02am by a video enabled telemedicine application and verified that I am speaking with the correct person using two identifiers.  Location: Patient: Address on file Provider: Providers address   I discussed the limitations of evaluation and management by telemedicine and the availability of in person appointments. The patient expressed understanding and agreed to proceed.  I discussed the assessment and treatment plan with the patient. The patient was provided an opportunity to ask questions and all were answered. The patient agreed with the plan and demonstrated an understanding of the instructions.   The patient was advised to call back or seek an in-person evaluation if the symptoms worsen or if the condition fails to improve as anticipated.  I provided 54 minutes of non-face-to-face time during this encounter.   Kimberly KATHEE Husband, LCSW   Session Time: 10:02am-10:56am  Participation Level: Active  Behavioral Response: CasualAlertNegative  Type of Therapy: Individual Therapy  Treatment Goals addressed:  Active     BH CCP Acute or Chronic Trauma Reaction     LTG: Recall traumatic events without becoming overwhelmed with negative emotions (Not Progressing)     Start:  07/31/23    Expected End:  12/20/23         STG: I want to be able to say what I want to say. (Progressing)     Start:  07/31/23    Expected End:  12/20/23       Goal Note     12/16/23: Patient reports improvements with her ability to use her voice to establish boundaries with her children.          STG: Kimberly Hobbs will identify coping strategies to deal with trauma memories and the associated emotional reaction (Progressing)     Start:  07/31/23    Expected End:  12/20/23       Goal Note     12/16/23: Patient reports use of her container during the day but it is ineffective at night.           Kimberly Hobbs      Start:  07/31/23         Work with Kimberly to construct a list of the situations, people, & places that Kimberly Hobbs evoke the most distressing symptoms; suggest that they keep a journal of instances of stress being triggered     Start:  07/31/23         Teach Kimberly coping strategies (e.g., writing down thoughts and feelings in a journal; taking deep, slow breaths; calling a support person to talk about memories) to deal with trauma memories and sudden emotional reactions without becoming emotionally nu     Start:  07/31/23         Provide Kimberly education on communication patterns in relationships     Start:  07/31/23            ProgressTowards Goals: Progressing  Interventions: CBT, Supportive, and Reframing  Summary:  Kimberly Hobbs is a 63 y.o. female who presents with little interest, low mood, fatigue, difficulties with sleep, changes to her appetite, falling anxious, uncontrollable worry, restlessness, flashbacks and hypervigilance. Pt was oriented times 5. Pt was cooperative and engaged. Pt denies SI/HI/AVH.   The patient reflected on her relationship with her son and her communication with him through her daughter. She expressed concerns about  his safety and shared her feelings of confusion and hope regarding the possibility of restoring their relationship. She also contemplated managing her expectations.She feels as though she has grieved her son as if he were deceased, and now, as he is making efforts to resume communication, she senses that her grief is beginning again.  The patient demonstrated difficulties in seeing other perspectives and understanding that she cannot place her expectations on how others should act based solely on how she would respond. She explored her values and how she has remained aligned with them when making difficult life choices  and showing up in her relationships.She examined the belief of What did I do wrong? stemming from feelings of being unloved by her children and disappointment in their relationships. The patient reflected on her trauma responses and how these reactions are impacting her relationships with her children. She explored ways to separate cognitive patterns of personalization. Additionally, she discussed barriers to seeing her children. She reflected on gratitude rather than focusing solely on what is not happening within her personal relationships.  Cognitive-behavioral therapy (CBT) techniques were used to help her reframe doubts and the negative internal dialogue regarding her role as a mother.   Suicidal/Homicidal: Nowithout intent/plan  Therapist Response: Clinician utilized active and supportive reflection to create a safe space for patient to process recent life events. Clinician assessed for current symptoms, safety, stressors since last session.  Clinician worked with the patient to explore alternative perspectives and exercises she can engage in to reframe doubts through CBT related to negative internal dialogue regarding her role as a mother.  Challenged patient to reflect on areas of improvement/growth.  Worked with patient to identify negative thinking traps such as personalizing others behaviors and explore shifting perspective to focus on moments of gratitude.  Plan: Return again in 2 weeks.  Diagnosis: PTSD (post-traumatic stress disorder)  MDD (major depressive disorder), recurrent episode, mild  Anxiety state   Collaboration of Care: [facility]EB psychiatrist can access notes and cln. Will review psychiatrists' notes. Check in with the patient and will see LCSW per availability. Patient agreed with treatment recommendations.    Patient/Guardian was advised Release of Information must be obtained prior to any record release in order to collaborate their care with an outside provider.  Patient/Guardian was advised if they have not already done so to contact the registration department to sign all necessary forms in order for us  to release information regarding their care.   Consent: Patient/Guardian gives verbal consent for treatment and assignment of benefits for services provided during this visit. Patient/Guardian expressed understanding and agreed to proceed.   Kimberly KATHEE Husband, LCSW 01/19/2024

## 2024-01-20 ENCOUNTER — Ambulatory Visit: Admitting: Internal Medicine

## 2024-01-21 ENCOUNTER — Encounter: Payer: Self-pay | Admitting: Physical Medicine & Rehabilitation

## 2024-01-21 MED ORDER — OXYCODONE-ACETAMINOPHEN 7.5-325 MG PO TABS: 1.0000 | ORAL_TABLET | Freq: Two times a day (BID) | ORAL | 0 refills | Status: DC | PRN

## 2024-01-21 NOTE — Telephone Encounter (Signed)
 Requested Prescriptions   Pending Prescriptions Disp Refills   oxyCODONE -acetaminophen  (PERCOCET) 7.5-325 MG tablet 60 tablet 0    Sig: Take 1 tablet by mouth every 12 (twelve) hours as needed for severe pain (pain score 7-10).     Date of patient request: 01/21/2024 Last office visit: 01/01/2024 Upcoming visit: 02/26/2024 Date of last refill: 12/16/2023 Last refill amount: #60 0 refills

## 2024-01-27 ENCOUNTER — Ambulatory Visit: Attending: Cardiology

## 2024-01-27 DIAGNOSIS — Z5181 Encounter for therapeutic drug level monitoring: Secondary | ICD-10-CM

## 2024-01-27 DIAGNOSIS — Z952 Presence of prosthetic heart valve: Secondary | ICD-10-CM

## 2024-01-27 DIAGNOSIS — I480 Paroxysmal atrial fibrillation: Secondary | ICD-10-CM

## 2024-01-27 LAB — POCT INR: INR: 2.9 (ref 2.0–3.0)

## 2024-01-27 NOTE — Patient Instructions (Signed)
 Continue taking warfarin 1 tablet (5mg ) daily EXCEPT 1/2 tablet on Sundays.   INR in 2 weeks in office appt [facility] [phone]  [facility] Clearance request Fax #[phone] *Amiodarone  200 mg daily

## 2024-01-28 ENCOUNTER — Telehealth: Payer: Self-pay

## 2024-01-28 NOTE — Telephone Encounter (Signed)
 Transmission received:   Presenting rhythm: AP/VS. Normal Device Function.  No atrial arrhythmias noted.  Per review of rate histograms the patient appears to be having some infrequent PVC's.  She did have 1 mode switch on 01/27/24 and may have also been symptomatic with V-pacing as she rarely paces in the V.    Called and spoke with the patient and advised her of the above findings.  She states she is hyper aware of anything to do with her heart. Inquired if she has missed any doses of amiodarone  or metoprolol  and she confirms she has not. Also inquired if she has experienced any recent illness/ dehydration/ increased stress/ or additional medication changes.   Ms. Oshields advised that her daughter has recently come into town and this may be a source of stress for her.  She states she spent all day with her daughter yesterday and that was the day she felt the worst.  I advised Ms. Krizek that her transmission is very reassuring and that I will forward this to the [facility] as an FYI.   The patient voices understanding and is agreeable.  She was very appreciative of the call back.  __________________________________________________________________________

## 2024-01-28 NOTE — Telephone Encounter (Signed)
 Call received from Pt.  Per Pt she has been having palpitations that started 4 days ago.  She is sending a manual transmission.  Will review and follow up.

## 2024-02-02 ENCOUNTER — Other Ambulatory Visit: Payer: Self-pay | Admitting: Psychiatry

## 2024-02-02 NOTE — Telephone Encounter (Signed)
 Please confirm with the pharmacy as there should be a refill. Please update the patient accordingly. Thanks.

## 2024-02-03 ENCOUNTER — Ambulatory Visit: Admitting: Licensed Clinical Social Worker

## 2024-02-03 NOTE — Telephone Encounter (Signed)
 Called patient she was at the pharmacy getting her medication and will call the office if she has any problems

## 2024-02-04 ENCOUNTER — Ambulatory Visit: Admitting: Licensed Clinical Social Worker

## 2024-02-04 DIAGNOSIS — F411 Generalized anxiety disorder: Secondary | ICD-10-CM

## 2024-02-04 DIAGNOSIS — F330 Major depressive disorder, recurrent, mild: Secondary | ICD-10-CM | POA: Diagnosis not present

## 2024-02-04 DIAGNOSIS — F4310 Post-traumatic stress disorder, unspecified: Secondary | ICD-10-CM
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
## 2024-02-10 ENCOUNTER — Ambulatory Visit: Attending: Cardiology

## 2024-02-10 ENCOUNTER — Ambulatory Visit: Payer: 59

## 2024-02-10 DIAGNOSIS — I480 Paroxysmal atrial fibrillation: Secondary | ICD-10-CM

## 2024-02-10 DIAGNOSIS — Z5181 Encounter for therapeutic drug level monitoring: Secondary | ICD-10-CM | POA: Diagnosis not present

## 2024-02-10 DIAGNOSIS — Z952 Presence of prosthetic heart valve: Secondary | ICD-10-CM | POA: Diagnosis not present

## 2024-02-10 LAB — POCT INR: INR: 3.3 — AB (ref 2.0–3.0)

## 2024-02-10 NOTE — Patient Instructions (Signed)
 Continue taking warfarin 1 tablet (5mg ) daily EXCEPT 1/2 tablet on Sundays.   INR in 2 weeks in office appt [facility] [phone]  [facility] Clearance request Fax #[phone] *Amiodarone  200 mg daily

## 2024-02-11 LAB — CUP PACEART REMOTE DEVICE CHECK
Battery Remaining Longevity: 127 mo
Battery Voltage: 3.03 V
Brady Statistic RV Percent Paced: 0.04 %
Date Time Interrogation Session: 20251222222754
HighPow Impedance: 67 Ohm
Implantable Lead Connection Status: 753985
Implantable Lead Connection Status: 753985
Implantable Lead Implant Date: 20241223
Implantable Lead Implant Date: 20241223
Implantable Lead Location: 753859
Implantable Lead Location: 753860
Implantable Lead Model: 5076
Implantable Lead Model: 5076
Implantable Pulse Generator Implant Date: 20241223
Lead Channel Impedance Value: 323 Ohm
Lead Channel Impedance Value: 342 Ohm
Lead Channel Impedance Value: 399 Ohm
Lead Channel Pacing Threshold Amplitude: 0.75 V
Lead Channel Pacing Threshold Amplitude: 0.75 V
Lead Channel Pacing Threshold Pulse Width: 0.4 ms
Lead Channel Pacing Threshold Pulse Width: 0.4 ms
Lead Channel Sensing Intrinsic Amplitude: 1.9 mV
Lead Channel Sensing Intrinsic Amplitude: 21 mV
Lead Channel Setting Pacing Amplitude: 1.5 V
Lead Channel Setting Pacing Amplitude: 2 V
Lead Channel Setting Pacing Pulse Width: 0.4 ms
Lead Channel Setting Sensing Sensitivity: 0.3 mV
Zone Setting Status: 755011
Zone Setting Status: 755011

## 2024-02-11 NOTE — Progress Notes (Signed)
 Remote ICD Transmission

## 2024-02-13 ENCOUNTER — Ambulatory Visit: Payer: Self-pay | Admitting: Cardiology

## 2024-02-20 ENCOUNTER — Ambulatory Visit: Admitting: Licensed Clinical Social Worker

## 2024-02-20 DIAGNOSIS — Z91199 Patient's noncompliance with other medical treatment and regimen due to unspecified reason: Secondary | ICD-10-CM

## 2024-02-20 NOTE — Progress Notes (Signed)
" °  Clinician attempted session via telehealth, but Kimberly Hobbs did not join. Cln sent the patient a text message during the appointment with the link for the visit as an additional reminder. Cln waited 15 minutes after appointment for start but patient never appeared. Per [employer], s/he will be charged a no-show fee.  "
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
## 2024-02-23 ENCOUNTER — Ambulatory Visit: Admitting: Licensed Clinical Social Worker

## 2024-02-23 DIAGNOSIS — F411 Generalized anxiety disorder: Secondary | ICD-10-CM

## 2024-02-23 DIAGNOSIS — F330 Major depressive disorder, recurrent, mild: Secondary | ICD-10-CM

## 2024-02-23 DIAGNOSIS — F4310 Post-traumatic stress disorder, unspecified: Secondary | ICD-10-CM | POA: Diagnosis not present

## 2024-02-23 NOTE — Progress Notes (Signed)
 "  THERAPIST PROGRESS NOTE  Virtual Visit via Video Note  I connected with Kimberly Hobbs on 02/23/2024 at 11:00 AM EST by a video enabled telemedicine application and verified that I am speaking with the correct person using two identifiers.  Location: Patient: Address on file  Provider: Providers address   I discussed the limitations of evaluation and management by telemedicine and the availability of in person appointments. The patient expressed understanding and agreed to proceed.   I discussed the assessment and treatment plan with the patient. The patient was provided an opportunity to ask questions and all were answered. The patient agreed with the plan and demonstrated an understanding of the instructions.   The patient was advised to call back or seek an in-person evaluation if the symptoms worsen or if the condition fails to improve as anticipated.  I provided 54 minutes of non-face-to-face time during this encounter.   Kimberly KATHEE Husband, LCSW   Session Time: 11-11:54am  Participation Level: Active  Behavioral Response: CasualAlertNegative and Tearful  Type of Therapy: Individual Therapy  Treatment Goals addressed:  Active     BH CCP Acute or Chronic Trauma Reaction     LTG: Recall traumatic events without becoming overwhelmed with negative emotions (Not Progressing)     Start:  07/31/23    Expected End:  12/20/23         STG: I want to be able to say what I want to say. (Progressing)     Start:  07/31/23    Expected End:  12/20/23       Goal Note     12/16/23: Patient reports improvements with her ability to use her voice to establish boundaries with her children.          STG: Kimberly Hobbs will identify coping strategies to deal with trauma memories and the associated emotional reaction (Progressing)     Start:  07/31/23    Expected End:  12/20/23       Goal Note     12/16/23: Patient reports use of her container during the day but it is  ineffective at night.          Cooperate with trauma-focused psychotherapy techniques to reduce emotional reaction to the traumatic event      Start:  07/31/23         Work with Kimberly to construct a list of the situations, people, & places that Kimberly Hobbs evoke the most distressing symptoms; suggest that they keep a journal of instances of stress being triggered     Start:  07/31/23         Teach Kimberly coping strategies (e.g., writing down thoughts and feelings in a journal; taking deep, slow breaths; calling a support person to talk about memories) to deal with trauma memories and sudden emotional reactions without becoming emotionally nu     Start:  07/31/23         Provide Kimberly education on communication patterns in relationships     Start:  07/31/23          Progress Towards Goals: Progressing  Interventions: Solution Focused, Assertiveness Training, and Supportive  Summary: Kimberly Hobbs is a 64 y.o. female who presents with little interest, low mood, fatigue, difficulties with sleep, changes to her appetite, falling anxious, uncontrollable worry, restlessness, flashbacks and hypervigilance. Pt was oriented times 5. Pt was cooperative and engaged. Pt denies SI/HI/AVH.   The patient used the therapeutic space to process a recent disagreement with her daughter,  which resulted in negative communication. This led to a realization of unhealthy dynamics that the patient is now ready to change.  Patient reflected on components of the situation to which she could have had an alternative response or ended the conversation altogether.  Patient identified she feels it would be helpful for her to turn her phone off or exit from a conversation by hanging up the phone after establishing the boundary.  She also identified it to be helpful for her to delete any negative text exchanges once she turns her phone back on possibly with the help of her partner.  Patient reflected on fear of  consequences to her establishing boundaries while also holding space for acknowledging she is due to show care and consideration for her own wellbeing.  Explored connections to childhood and efforts she is making to parent differently than her parents interacted with her.  Problem solved ways in which she can express boundaries and patient role-played the conversation she plans to have with her daughter.  Suicidal/Homicidal: Nowithout intent/plan  Therapist Response: Clinician utilized active and supportive reflection to create a safe space for patient to process recent life events. Clinician assessed for current symptoms, safety, stressors since last session.  Process new memory and patient's target sequence plan through EMDR.  Patient will continue EMDR processing and her next appointment. Together, we explored alternative reactions and ways for the patient to take accountability for her part in the situation. We examined connections to the patients childhood and considered how her fear of abandonment may have influenced her responses. We also discussed possible solutions to limit exposure to unhealthy communication and identified ways for the patient to use assertive communication to establish healthier boundaries. Finally, the clinician and the patient role-played her response to her daughter's apology.  Plan: Return again in 2 weeks.   Diagnosis: PTSD (post-traumatic stress disorder)   MDD (major depressive disorder), recurrent episode, mild   Anxiety state     Collaboration of Care: [facility] psychiatrist can access notes and cln. Will review psychiatrists' notes. Check in with the patient and will see LCSW per availability. Patient agreed with treatment recommendations.   Patient/Guardian was advised Release of Information must be obtained prior to any record release in order to collaborate their care with an outside provider. Patient/Guardian was advised if they have not already done so to  contact the registration department to sign all necessary forms in order for us  to release information regarding their care.   Consent: Patient/Guardian gives verbal consent for treatment and assignment of benefits for services provided during this visit. Patient/Guardian expressed understanding and agreed to proceed.   Kimberly KATHEE Husband, LCSW 02/23/2024  "

## 2024-02-24 ENCOUNTER — Ambulatory Visit

## 2024-02-26 ENCOUNTER — Encounter: Attending: Physical Medicine & Rehabilitation | Admitting: Physical Medicine & Rehabilitation

## 2024-02-26 ENCOUNTER — Encounter: Payer: Self-pay | Admitting: Physical Medicine & Rehabilitation

## 2024-02-26 VITALS — BP 117/78 | HR 79 | Ht 65.0 in | Wt 211.0 lb

## 2024-02-26 DIAGNOSIS — Z79891 Long term (current) use of opiate analgesic: Secondary | ICD-10-CM | POA: Diagnosis not present

## 2024-02-26 DIAGNOSIS — G894 Chronic pain syndrome: Secondary | ICD-10-CM | POA: Insufficient documentation

## 2024-02-26 DIAGNOSIS — G8929 Other chronic pain: Secondary | ICD-10-CM | POA: Diagnosis present

## 2024-02-26 DIAGNOSIS — M545 Low back pain, unspecified: Secondary | ICD-10-CM | POA: Diagnosis not present

## 2024-02-26 MED ORDER — OXYCODONE-ACETAMINOPHEN 10-325 MG PO TABS: 1.0000 | ORAL_TABLET | Freq: Two times a day (BID) | ORAL | 0 refills | Status: AC | PRN

## 2024-02-26 NOTE — Progress Notes (Signed)
 "  Subjective:    Patient ID: Kimberly Hobbs, female    DOB: [birth year], 64 y.o.   MRN: [id number]  HPI  Kimberly Hobbs is a 64 y.o. year old female  who  has a past medical history of CKD stage 3 due to type 2 diabetes mellitus (HCC) (05/15/2021), COVID-19, Hyperlipidemia, Hypertension, Pain in both lower extremities (07/23/2019), and Rheumatic heart disease.   They are presenting to PM&R clinic as a new patient for pain management evaluation. They were referred by [name] for treatment of low back pain.  She cant stand for long. Has to use a walker.  Pain is severe if she walks without this. Pain started about 2 years ago but is worsening. 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  Pain is mostly on the right side of her lower back. Sometimes both sides. It doesn't shoot down her legs.  Lying down helps the pain.  She reports her activities are very limited by her pain.  She has been taking hydrocodone  5/325 1 pain is very severe. History of prosthetic valves, schedule for ablation for A-fib       Red flag symptoms: No red flags for back pain endorsed in Hx or ROS   Medications tried: Topical medications - doesn't recall name, didn't help Nsaids- cannot take for cardiac reasons Tylenol - doesn't help Opiates  Hydrocodone  -she uses sparingly when pain is very severe, this does help control her pain Gabapentin  / Lyrica   Has not tried  Tramadol -does not remember TCAs  - Denies  She is currently on Celexa  for her anxiety, this medication visit has been working very well for her Robaxin  helps pain   Other treatments: PT/OT  has not tried, in afib waiting on heart ablation  TENs unit - has not tried  Injections-denies Surgery - denies      Interal History 08/23/22   Kimberly Hobbs is here for follow-up regarding her chronic pain.  She reports she continues to have right-sided back pain.  She also has some aching pain in her  legs bilaterally.  She reports she has her cardiac ablation procedures 09/05/2022 so after this time she would be interested in getting facet joint injections.  Pain is worsened with standing and she continues to use her walker for ambulation.  Pain continues to be improved with lying down.  Vicodin 7.5 is helping her control this pain.  She tried to use this sparingly only when pain is very severe and 30 tablets has lasted her about a month and a half.  She is not having any side effects with this medication.   Interal History 10/29/2022 Kimberly Hobbs is here for follow-up regarding her right-sided back pain with aching pain in her legs.  She continues to use occasional Vicodin 7.5 which helps keep her pain controlled when it is very severe.  She tries to use this sparingly.  She has been started on gabapentin  100 mg at bedtime which has greatly reduced her bilateral leg pain.  She reports her Celexa  was discontinued because it was no longer helping.  She says she is currently not on any antidepressant medicines for anxiety.  She has had a few sessions of physical therapy but has not had significant benefits at this time. Patient has been using occasional Flexeril  10 mg with benefit, she does not use this when she takes for Norco.       Interal History 01/07/23  Kimberly Hobbs is here for follow-up regarding her right-sided back pain and leg pain.  She continues to use Vicodin 7.5 mg sparingly when the pain is severe.  She still has most of her pills from her last refill.  Leg pain continues to be improved since starting gabapentin .  She stopped using Cymbalta , says she did not tolerate this and made her feel bad.  She reports pain is not significantly improved after physical therapy.     Interal History 03/19/22 Kimberly Hobbs reports that she has been having worse pain in her lower back more on the right side.  When she does activity she will have pain all the way across her back.  Hydrocodone  not helping pain is  much as it previously did.  Patient is currently following with cardiology working on treatment of her arrhythmia.  She is now restarted on amiodarone .  She had a dual-chamber ICD implanted 02/10/2023.  Interval history 04/17/2023 Kimberly Hobbs is here for follow-up regarding her back pain.  She reports her back pain is doing better, not having help her husband as much as he has been more active himself.  He does have another surgery scheduled.  She is only requiring use of oxycodone  sparingly and it is helping her pain.   Interval history 07/15/2023 Patient reports her last use of oxycodone  was yesterday.  She has been using oxycodone  sparingly, reports it decreases her pain but is not strong enough to fully control her back pain.  She is limited in household activities due to her back pain.  She is not able to do a lot of cooking or cleaning and has to rest a lot because of her pain.  Pain is present all the time but is worsened with activity.  Interval history 09/09/23 Patient is here for follow-up regarding her chronic pain.  She was not able to tolerate buprenorphine  due to body wide rash.  Oxycodone  is not helping as much as it was previously.  She typically takes this at night but sometimes takes it twice a day.  No side effects with the medication.  Her pain makes it difficult to do chores and other activities at home.   Interval history 10/03/23  Reports chronic lower back pain worsened for approximately 1 year. Pain worsened when lifting mother, which significantly aggravated symptoms. Pain primarily localized to lower back without radiation down legs. Pain worsens with activity and prolonged standing. Reports being in tears when getting up in morning and cooking breakfast. Pain improves when off feet or sitting with pillows for support.  Percocet - currently taking once nightly at bedtime, prescribed every 12 hours but patient only using at night. Notes not waking up in pain as much since  starting medication.  Pt has not used it in the day, so she is not sure if it will help her pain at this time.    Gabapentin  100mg  - taking once nightly about 1 hour before bed.  Initially helpful but now reports no benefit. Originally prescribed for restless leg syndrome. Restless leg syndrome - ongoing, keeps awake more than back pain. Reports pickle juice provides some relief.  Interval History 10/31/23 Reports continued chronic low back pain, described as unbearable. Pain is significantly worse with standing and walking, but is fine when lying down. Pain has become worse since the last MRI a year and a half ago. Unable to walk around stores like [employer] without a mobility cart. If no cart is available, has to leave the store. Unable  to stand in the kitchen to cook a meal. Pain is located across the low back, but is worse on the right side. Denies the pain shoots down the legs.  Current oxycodone  medication is taken at night and helps with sleep but provides limited daytime pain relief. She usually takes it at night only.   Discussed the possibility of a back injection. The patient understands they may need to coordinate with their cardiologist regarding stopping Coumadin , but it was noted that for this specific procedure, stopping the blood thinner may not be necessary. The patient is willing to proceed with bridging therapy (stomach injections) if required.  The patient inquired about obtaining a scooter for community mobility.  Interval History 01/01/24 Reports recent diagnostic nerve block procedure was very painful. Experienced significant anxiety before and during the procedure. After the procedure, experienced approximately 90% pain relief for short time. Expressed willingness to proceed with a second diagnostic block as required.  Current Pain and Medication Management - Back pain has returned since the temporary relief from the block. - Pain is most severe at night. - Uses Percocet  7.5 mg at night for pain. Currently taking 1.5 tablets (11.25 mg) for adequate relief.  Restless Legs Syndrome (RLS) - Reports severe RLS, occurring nightly and significantly disrupting sleep. - Currently taking gabapentin  100 mg at night without good benefit   Interval history 02/26/2024 Patient here for follow-up of her chronic pain.  She reports she had not received the 10 mg dose of Percocet, the 7.5 mg dose has been suboptimal.  She typically uses this at night and this is when the pain is the worst.  Primary pain continues to be in her lower back.  No side effects with medication.  She is scheduled for repeat MBB with Dr. Carilyn next month.   Pain Inventory Average Pain 8 Pain right now 1  My pain is intermittent, sharp, stabbing, and aching, aching, dull, burning  In the last 24 hours, has pain interfered with the following? General activity 10 Relation with others 0 Enjoyment of life 7  What TIME of day is your pain at its worst? Evening, night  Sleep (in general) Fair  Pain is worse with: walking, bending standing  Pain improves with: Medication  Relief from Meds: 5  Family History  Problem Relation Age of Onset   Drug abuse Mother    Alcohol abuse Mother    Anxiety disorder Mother    Cancer Mother    Hypertension Mother    Diabetes Mother    Hyperlipidemia Mother    Heart disease Mother    Drug abuse Father    Alcohol abuse Father    Anxiety disorder Father    Cancer Father    Hypertension Father    Diabetes Father    Heart disease Father    Hypertension Sister    Hypertension Brother    Hypertension Brother    Schizophrenia Maternal Grandmother    Cancer Daughter    COPD Daughter    Healthy Child    Healthy Child    Healthy Child    Healthy Child    Breast cancer Neg Hx    Social History   Socioeconomic History   Marital status: Divorced    Spouse name: Not on file   Number of children: 4   Years of education: Not on file   Highest  education level: GED or equivalent  Occupational History   Occupation: unemployed    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  Tobacco Use   Smoking status: Former    Current packs/day: 0.00    Average packs/day: 1.6 packs/day for 50.0 years (80.0 ttl pk-yrs)    Types: Cigarettes    Start date: 12/23/1976    Quit date: 12/23/2016    Years since quitting: 7.1    Passive exposure: Past   Smokeless tobacco: Never   Tobacco comments:    Former smoker 01/24/23  Vaping Use   Vaping status: Never Used  Substance and Sexual Activity   Alcohol use: Never   Drug use: Never   Sexual activity: Not Currently  Other Topics Concern   Not on file  Social History Narrative   ** Merged History Encounter ** Epworth Sleepiness Scale = 4 (as of 06/28/2015)   Right handed    Social Drivers of Health   Tobacco Use: Medium Risk (01/01/2024)   Patient History    Smoking Tobacco Use: Former    Smokeless Tobacco Use: Never    Passive Exposure: Past  Physicist, Medical Strain: Medium Risk (01/16/2024)   Overall Financial Resource Strain (CARDIA)    Difficulty of Paying Living Expenses: Somewhat hard  Food Insecurity: No Food Insecurity (01/16/2024)   Epic    Worried About Programme Researcher, Broadcasting/film/video in the Last Year: Never true    Ran Out of Food in the Last Year: Never true  Transportation Needs: No Transportation Needs (01/16/2024)   Epic    Lack of Transportation (Medical): No    Lack of Transportation (Non-Medical): No  Physical Activity: Inactive (01/16/2024)   Exercise Vital Sign    Days of Exercise per Week: 0 days    Minutes of Exercise per Session: Not on file  Stress: Stress Concern Present (01/16/2024)   [employer] of Occupational Health - Occupational Stress Questionnaire    Feeling of Stress: Very much  Social Connections: Socially Isolated (01/16/2024)   Social Connection and Isolation Panel    Frequency of Communication with Friends and Family: More than three times a  week    Frequency of Social Gatherings with Friends and Family: Never    Attends Religious Services: Never    Database Administrator or Organizations: No    Attends Engineer, Structural: Not on file    Marital Status: Divorced  Depression (PHQ2-9): High Risk (12/16/2023)   Depression (PHQ2-9)    PHQ-2 Score: 12  Alcohol Screen: Low Risk (10/10/2021)   Alcohol Screen    Last Alcohol Screening Score (AUDIT): 0  Housing: Unknown (01/16/2024)   Epic    Unable to Pay for Housing in the Last Year: No    Number of Times Moved in the Last Year: Not on file    Homeless in the Last Year: No  Utilities: Not At Risk (05/12/2023)   AHC Utilities    Threatened with loss of utilities: No  Health Literacy: Adequate Health Literacy (10/14/2022)   B1300 Health Literacy    Frequency of need for help with medical instructions: Never   Past Surgical History:  Procedure Laterality Date   ATRIAL FIBRILLATION ABLATION N/A 09/05/2022   Procedure: ATRIAL FIBRILLATION ABLATION;  Surgeon: Inocencio Soyla Lunger, MD;  Location: [facility];  Service: Cardiovascular;  Laterality: N/A;   ATRIAL FIBRILLATION ABLATION N/A 07/24/2023   Procedure: ATRIAL FIBRILLATION ABLATION;  Surgeon: Inocencio Soyla Lunger, MD;  Location: [facility];  Service: Cardiovascular;  Laterality: N/A;   CARDIAC CATHETERIZATION     CARDIAC VALVE REPLACEMENT  CARDIOVERSION N/A 04/02/2022   Procedure: CARDIOVERSION;  Surgeon: Elmira Newman PARAS, MD;  Location: [facility];  Service: Cardiovascular;  Laterality: N/A;   CARDIOVERSION N/A 04/27/2022   Procedure: CARDIOVERSION;  Surgeon: Elmira Newman PARAS, MD;  Location: [facility];  Service: Cardiovascular;  Laterality: N/A;   CARDIOVERSION N/A 03/27/2023   Procedure: CARDIOVERSION;  Surgeon: Alvan Ronal BRAVO, MD;  Location: [facility];  Service: Cardiovascular;  Laterality: N/A;   CARDIOVERSION N/A 05/06/2023   Procedure: CARDIOVERSION;  Surgeon: Sheena Pugh,  DO;  Location: [facility];  Service: Cardiovascular;  Laterality: N/A;   CARDIOVERSION N/A 06/02/2023   Procedure: CARDIOVERSION;  Surgeon: Jeffrie Oneil BROCKS, MD;  Location: [facility];  Service: Cardiovascular;  Laterality: N/A;   CHOLECYSTECTOMY     CORONARY ARTERY BYPASS GRAFT     CORONARY/GRAFT ANGIOGRAPHY N/A 08/18/2018   Procedure: CORONARY/GRAFT ANGIOGRAPHY;  Surgeon: Elmira Newman PARAS, MD;  [facility]: [facility];  Service: Cardiovascular;  Laterality: N/A;   ELECTROPHYSIOLOGY STUDY N/A 02/04/2023   Procedure: ELECTROPHYSIOLOGY STUDY;  Surgeon: Inocencio Soyla Lunger, MD;  [facility]: [facility];  Service: Cardiovascular;  Laterality: N/A;   ICD IMPLANT N/A 02/10/2023   Procedure: ICD IMPLANT;  Surgeon: Inocencio Soyla Lunger, MD;  Location: [facility] CV [facility];  Service: Cardiovascular;  Laterality: N/A;   LEG SURGERY     metal plate in leg   RIGHT HEART CATH AND CORONARY/GRAFT ANGIOGRAPHY N/A 07/21/2018   Procedure: RIGHT HEART CATH AND CORONARY/GRAFT ANGIOGRAPHY;  Surgeon: Elmira Newman PARAS, MD;  [facility];  Service: Cardiovascular;  Laterality: N/A;   TEE WITHOUT CARDIOVERSION N/A 07/27/2015   Procedure: TRANSESOPHAGEAL ECHOCARDIOGRAM (TEE);  Surgeon: Jerel Balding, MD;  Location: [facility] ENDOSCOPY;  Service: Cardiovascular;  Laterality: N/A;   TEE WITHOUT CARDIOVERSION N/A 07/21/2018   Procedure: TRANSESOPHAGEAL ECHOCARDIOGRAM (TEE);  Surgeon: Elmira Newman PARAS, MD;  Location: [facility] ENDOSCOPY;  Service: Cardiovascular;  Laterality: N/A;   TONSILLECTOMY AND ADENOIDECTOMY     TRANSESOPHAGEAL ECHOCARDIOGRAM (CATH LAB) N/A 05/06/2023   Procedure: TRANSESOPHAGEAL ECHOCARDIOGRAM;  Surgeon: Sheena Pugh, DO;  [facility];  Service: Cardiovascular;  Laterality: N/A;   TRANSESOPHAGEAL ECHOCARDIOGRAM (CATH LAB) N/A 06/02/2023   Procedure: TRANSESOPHAGEAL ECHOCARDIOGRAM;  Surgeon: Jeffrie Oneil BROCKS, MD;  [facility];   Service: Cardiovascular;  Laterality: N/A;   Past Surgical History:  Procedure Laterality Date   ATRIAL FIBRILLATION ABLATION N/A 09/05/2022   Procedure: ATRIAL FIBRILLATION ABLATION;  Surgeon: Inocencio Soyla Lunger, MD;  [facility];  Service: Cardiovascular;  Laterality: N/A;   ATRIAL FIBRILLATION ABLATION N/A 07/24/2023   Procedure: ATRIAL FIBRILLATION ABLATION;  Surgeon: Inocencio Soyla Lunger, MD;  [facility];  Service: Cardiovascular;  Laterality: N/A;   CARDIAC CATHETERIZATION     CARDIAC VALVE REPLACEMENT     CARDIOVERSION N/A 04/02/2022   Procedure: CARDIOVERSION;  Surgeon: Elmira Newman PARAS, MD;  [facility];  Service: Cardiovascular;  Laterality: N/A;   CARDIOVERSION N/A 04/27/2022   Procedure: CARDIOVERSION;  Surgeon: Elmira Newman PARAS, MD;  [facility];  Service: Cardiovascular;  Laterality: N/A;   CARDIOVERSION N/A 03/27/2023   Procedure: CARDIOVERSION;  Surgeon: Alvan Ronal BRAVO, MD;  [facility];  Service: Cardiovascular;  Laterality: N/A;   CARDIOVERSION N/A 05/06/2023   Procedure: CARDIOVERSION;  Surgeon: Sheena Pugh, DO;  [facility];  Service: Cardiovascular;  Laterality: N/A;   CARDIOVERSION N/A 06/02/2023   Procedure: CARDIOVERSION;  Surgeon: Jeffrie Oneil BROCKS, MD;  Location: [facility] INVASIVE CV LAB;  Service: Cardiovascular;  Laterality: N/A;   CHOLECYSTECTOMY     CORONARY ARTERY BYPASS GRAFT     CORONARY/GRAFT ANGIOGRAPHY N/A 08/18/2018   Procedure: CORONARY/GRAFT ANGIOGRAPHY;  Surgeon: Elmira Newman PARAS, MD;  [facility];  Service: Cardiovascular;  Laterality: N/A;   ELECTROPHYSIOLOGY STUDY N/A 02/04/2023   Procedure: ELECTROPHYSIOLOGY STUDY;  Surgeon: Inocencio Soyla Lunger, MD;  [facility];  Service: Cardiovascular;  Laterality: N/A;   ICD IMPLANT N/A 02/10/2023   Procedure: ICD IMPLANT;  Surgeon: Inocencio Soyla Lunger, MD;  Location: [facility] INVASIVE CV  LAB;  Service: Cardiovascular;  Laterality: N/A;   LEG SURGERY     metal plate in leg   RIGHT HEART CATH AND CORONARY/GRAFT ANGIOGRAPHY N/A 07/21/2018   Procedure: RIGHT HEART CATH AND CORONARY/GRAFT ANGIOGRAPHY;  Surgeon: Elmira Newman PARAS, MD;  [facility];  Service: Cardiovascular;  Laterality: N/A;   TEE WITHOUT CARDIOVERSION N/A 07/27/2015   Procedure: TRANSESOPHAGEAL ECHOCARDIOGRAM (TEE);  Surgeon: Jerel Balding, MD;  Location: [facility] ENDOSCOPY;  Service: Cardiovascular;  Laterality: N/A;   TEE WITHOUT CARDIOVERSION N/A 07/21/2018   Procedure: TRANSESOPHAGEAL ECHOCARDIOGRAM (TEE);  Surgeon: Elmira Newman PARAS, MD;  Location: [facility] ENDOSCOPY;  Service: Cardiovascular;  Laterality: N/A;   TONSILLECTOMY AND ADENOIDECTOMY     TRANSESOPHAGEAL ECHOCARDIOGRAM (CATH LAB) N/A 05/06/2023   Procedure: TRANSESOPHAGEAL ECHOCARDIOGRAM;  Surgeon: Sheena Pugh, DO;  [facility];  Service: Cardiovascular;  Laterality: N/A;   TRANSESOPHAGEAL ECHOCARDIOGRAM (CATH LAB) N/A 06/02/2023   Procedure: TRANSESOPHAGEAL ECHOCARDIOGRAM;  Surgeon: Jeffrie Oneil BROCKS, MD;  Location: [facility];  Service: Cardiovascular;  Laterality: N/A;   Past Medical History:  Diagnosis Date   Allergy  ?   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 01/01/2017 01/01/2017   LMP 06/20/2015   Opioid Risk Score:   Fall Risk Score:  `1  Depression screen [facility] 2/9      12/16/2023   11:07 AM 10/31/2023    2:09 PM 10/03/2023    9:28 AM 07/31/2023    9:03 AM 04/14/2023    1:38 PM 01/07/2023    9:27 AM 12/23/2022   10:25 AM  Depression screen PHQ 2/9  Decreased Interest  0 0   1 0  Down, Depressed, Hopeless  0 0   1 0  PHQ - 2 Score  0 0   2 0  Altered sleeping  0     3  Tired, decreased energy  0     1  Change in appetite  0     3  Feeling bad or failure about yourself   0     0  Trouble concentrating  0     0  Moving slowly or fidgety/restless  0     0  Suicidal thoughts  0     0  PHQ-9 Score  0      7   Difficult doing work/chores  Somewhat difficult     Not difficult at all     Information is confidential and restricted. Go to Review Flowsheets to unlock data.   Data saved with a previous flowsheet row definition     Review of Systems  Musculoskeletal:  Positive for back pain.  All other systems reviewed and are negative.      Objective:   Physical Exam  Gen: no distress, normal appearing HEENT: oral mucosa pink and moist, NCAT Cardio: Reg rate Chest: normal effort, normal rate of breathing Abd: soft, non-distended Ext: no edema Psych: pleasant, normal affect Skin: intact Neuro: Alert and awake, follows commands, cranial nerves II through XII intact, intact insight and judgment, speech and language intact Strength 5 out of 5 in all 4 extremities Sensation intact light touch in all 4 extremities No ankle clonus Musculoskeletal:  Bilateral lumbar tenderness to palpation SLR negative bilaterally Pain in lower back with spinal extension and facet loading   Prior exam:  Mild tenderness noted over the right SI joint   MRI L-spine 1/17/2024IMPRESSION: 1. L4-L5 moderate facet arthropathy, which can be a cause of back pain. Narrowing of the lateral recesses at this level could affect the descending L5 nerve roots. 2. No spinal canal stenosis or neural foraminal narrowing.       Assessment & Plan:    Worsening chronic low back  pain, consistent with L4-5 facet arthropathy, worse on the right. This correlates with the MRI from approximately 1.5 years ago which showed moderate facet arthritis, most significant at L4-5. The pain is severely impacting function and quality of life. -No improvement with Tylenol , unable to take NSAIDs due to cardiac history - Intermittent right SI joint tenderness has been noted, continue to monitor -Pain is primarily axial in her lower back, suspect this is facet joint mediated -ORT low -TENS unit, nexwave device ordered prior visit -Discontinued Norco 7.5 twice daily prior visit -Cymbalta  20 mg daily- She did not tolerate -She reports Physical Therapy provided minimal improvement -Pain agreement completed prior visit -Benefit with Right lumbar L3, L4 medial branch blocks and L5 dorsal ramus injection Dr. Carilyn -repeat procedure scheduled- continue with plan - Valium  5 mg, 1 tablet, prescribed to be taken prior to the next procedure for anxiety -Continue UDS, Pill Counts, PDMP monitoring  -Pill counts consistent today -Oxycodone /acetaminophen : Adjust to Percocet 10mg  BID PRN. #60 ordered - Butrans  caused rash  Ambulation Difficulty -Prior visit Refer to physical therapy for a mobility assessment to evaluate the need for a scooter or other assistive device. A referral will be sent to a location in [facility].   Chronic anxiety, denies SI or HI -No longer on Celexa , Cymbalta - she says it made her feel bad -Continue follow-up with mental health as directed  Restless Legs Syndrome:     - Stop gabapentin .     - Start pregabalin  (Lyrica ) 50 mg once daily at bedtime. Dose adjusted by CKD     - Counseled to trial for 2-3 weeks and to call if not effective, as the dose may need to be increased.  CKD      -07/01/23 Cr 1.44   Prior warnings -2nd warning today forgot pills for count 10/03/23 -She forgot pills prior visit- and had used codeine  cough syrup and  warning was given at the  time     "

## 2024-02-27 ENCOUNTER — Telehealth: Admitting: Psychiatry

## 2024-02-27 ENCOUNTER — Encounter: Payer: Self-pay | Admitting: Psychiatry

## 2024-02-27 DIAGNOSIS — F330 Major depressive disorder, recurrent, mild: Secondary | ICD-10-CM | POA: Diagnosis not present

## 2024-02-27 DIAGNOSIS — Z130 Encounter for screening for diseases of the blood and blood-forming organs and certain disorders involving the immune mechanism: Secondary | ICD-10-CM

## 2024-02-27 DIAGNOSIS — G4700 Insomnia, unspecified: Secondary | ICD-10-CM

## 2024-02-27 DIAGNOSIS — F411 Generalized anxiety disorder: Secondary | ICD-10-CM

## 2024-02-27 DIAGNOSIS — G2581 Restless legs syndrome: Secondary | ICD-10-CM

## 2024-02-27 DIAGNOSIS — F4310 Post-traumatic stress disorder, unspecified: Secondary | ICD-10-CM

## 2024-02-27 MED ORDER — HYDROXYZINE HCL 50 MG PO TABS: 50.0000 mg | ORAL_TABLET | Freq: Every day | ORAL | 0 refills | Status: DC | PRN

## 2024-02-27 MED ORDER — CLONAZEPAM 0.5 MG PO TABS: 0.5000 mg | ORAL_TABLET | Freq: Every evening | ORAL | 0 refills | Status: AC | PRN

## 2024-02-27 NOTE — Patient Instructions (Signed)
 Continue hydroxyzine  50 mg daily as needed for anxiety Continue clonazepam  0.5 mg at night as needed for insomnia, anxiety  Obtain lab- CBC, Ferritin at [facility]  Next appointment-  3/6 at 8 40

## 2024-03-01 ENCOUNTER — Ambulatory Visit (INDEPENDENT_AMBULATORY_CARE_PROVIDER_SITE_OTHER): Admitting: Licensed Clinical Social Worker

## 2024-03-01 DIAGNOSIS — F330 Major depressive disorder, recurrent, mild: Secondary | ICD-10-CM | POA: Diagnosis not present

## 2024-03-01 DIAGNOSIS — F4310 Post-traumatic stress disorder, unspecified: Secondary | ICD-10-CM

## 2024-03-01 DIAGNOSIS — F411 Generalized anxiety disorder: Secondary | ICD-10-CM | POA: Diagnosis not present
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
## 2024-03-02 ENCOUNTER — Other Ambulatory Visit: Payer: Self-pay | Admitting: Internal Medicine

## 2024-03-02 ENCOUNTER — Ambulatory Visit: Attending: Cardiology

## 2024-03-02 ENCOUNTER — Telehealth: Payer: Self-pay

## 2024-03-02 ENCOUNTER — Encounter: Payer: Self-pay | Admitting: Internal Medicine

## 2024-03-02 ENCOUNTER — Ambulatory Visit: Admitting: Internal Medicine

## 2024-03-02 VITALS — BP 118/72 | HR 71 | Temp 97.8°F | Ht 65.0 in | Wt 212.6 lb

## 2024-03-02 DIAGNOSIS — Z5181 Encounter for therapeutic drug level monitoring: Secondary | ICD-10-CM

## 2024-03-02 DIAGNOSIS — E1122 Type 2 diabetes mellitus with diabetic chronic kidney disease: Secondary | ICD-10-CM | POA: Diagnosis not present

## 2024-03-02 DIAGNOSIS — I10 Essential (primary) hypertension: Secondary | ICD-10-CM

## 2024-03-02 DIAGNOSIS — Z952 Presence of prosthetic heart valve: Secondary | ICD-10-CM | POA: Diagnosis not present

## 2024-03-02 DIAGNOSIS — Z87891 Personal history of nicotine dependence: Secondary | ICD-10-CM | POA: Diagnosis not present

## 2024-03-02 DIAGNOSIS — D509 Iron deficiency anemia, unspecified: Secondary | ICD-10-CM

## 2024-03-02 DIAGNOSIS — Z1329 Encounter for screening for other suspected endocrine disorder: Secondary | ICD-10-CM

## 2024-03-02 DIAGNOSIS — I480 Paroxysmal atrial fibrillation: Secondary | ICD-10-CM

## 2024-03-02 DIAGNOSIS — N183 Chronic kidney disease, stage 3 unspecified: Secondary | ICD-10-CM

## 2024-03-02 DIAGNOSIS — Z7984 Long term (current) use of oral hypoglycemic drugs: Secondary | ICD-10-CM

## 2024-03-02 LAB — CBC WITH DIFFERENTIAL/PLATELET
Basophils Absolute: 0 K/uL (ref 0.0–0.1)
Basophils Relative: 0.6 % (ref 0.0–3.0)
Eosinophils Absolute: 0.5 K/uL (ref 0.0–0.7)
Eosinophils Relative: 7.9 % — ABNORMAL HIGH (ref 0.0–5.0)
HCT: 34.1 % — ABNORMAL LOW (ref 36.0–46.0)
Hemoglobin: 10.8 g/dL — ABNORMAL LOW (ref 12.0–15.0)
Lymphocytes Relative: 29.4 % (ref 12.0–46.0)
Lymphs Abs: 2 K/uL (ref 0.7–4.0)
MCHC: 31.8 g/dL (ref 30.0–36.0)
MCV: 74.7 fl — ABNORMAL LOW (ref 78.0–100.0)
Monocytes Absolute: 0.5 K/uL (ref 0.1–1.0)
Monocytes Relative: 8.1 % (ref 3.0–12.0)
Neutro Abs: 3.7 K/uL (ref 1.4–7.7)
Neutrophils Relative %: 54 % (ref 43.0–77.0)
Platelets: 236 K/uL (ref 150.0–400.0)
RBC: 4.56 Mil/uL (ref 3.87–5.11)
RDW: 16.8 % — ABNORMAL HIGH (ref 11.5–15.5)
WBC: 6.8 K/uL (ref 4.0–10.5)

## 2024-03-02 LAB — COMPREHENSIVE METABOLIC PANEL WITH GFR
ALT: 111 U/L — ABNORMAL HIGH (ref 3–35)
AST: 68 U/L — ABNORMAL HIGH (ref 5–37)
Albumin: 4.3 g/dL (ref 3.5–5.2)
Alkaline Phosphatase: 118 U/L — ABNORMAL HIGH (ref 39–117)
BUN: 32 mg/dL — ABNORMAL HIGH (ref 6–23)
CO2: 27 meq/L (ref 19–32)
Calcium: 9.4 mg/dL (ref 8.4–10.5)
Chloride: 100 meq/L (ref 96–112)
Creatinine, Ser: 1.44 mg/dL — ABNORMAL HIGH (ref 0.40–1.20)
GFR: 38.98 mL/min — ABNORMAL LOW
Glucose, Bld: 138 mg/dL — ABNORMAL HIGH (ref 70–99)
Potassium: 3.5 meq/L (ref 3.5–5.1)
Sodium: 138 meq/L (ref 135–145)
Total Bilirubin: 0.4 mg/dL (ref 0.2–1.2)
Total Protein: 7.5 g/dL (ref 6.0–8.3)

## 2024-03-02 LAB — TSH: TSH: 5.66 u[IU]/mL — ABNORMAL HIGH (ref 0.35–5.50)

## 2024-03-02 LAB — POCT INR: INR: 2.3 (ref 2.0–3.0)

## 2024-03-02 LAB — T4, FREE: Free T4: 1.03 ng/dL (ref 0.60–1.60)

## 2024-03-02 LAB — MICROALBUMIN / CREATININE URINE RATIO
Creatinine,U: 137 mg/dL
Microalb Creat Ratio: 9.8 mg/g (ref 0.0–30.0)
Microalb, Ur: 1.3 mg/dL (ref 0.7–1.9)

## 2024-03-02 LAB — HEMOGLOBIN A1C: Hgb A1c MFr Bld: 6.3 % (ref 4.6–6.5)

## 2024-03-02 MED ORDER — ONETOUCH ULTRA 2 W/DEVICE KIT: PACK | 0 refills | Status: AC

## 2024-03-02 NOTE — Progress Notes (Signed)
 " [facility] PRIMARY [facility]ARE [facility] [address] [place] [address] Dept: [phone] Dept Fax: [phone]  New Patient Office Visit  Subjective:   Kimberly Hobbs [birth year] 03/02/2024  Chief Complaint  Patient presents with   Establish Care    Will discuss concerns with PCP, one touch machine     HPI: Kimberly Hobbs presents today to establish care at [facility] at [facility] Introduced to publishing rights manager role and practice setting.  All questions answered.  Concerns: See below   Discussed the use of AI scribe software for clinical note transcription with the patient, who gave verbal consent to proceed.  History of Present Illness   Kimberly Hobbs is a 64 year old female who presents to establish care as a new patient.  She has a history of essential hypertension, coronary artery disease with a bypass graft,rheumatic disease of mitral and aortic valve, and paroxysmal atrial fibrillation. She is managed by cardiology for these conditions and is currently on warfarin, attending a Coumadin  clinic for monitoring.  She has chronic obstructive pulmonary disease (COPD) and sleep apnea. Despite trying various masks, she has been unable to tolerate a CPAP machine.   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 She takes metformin  500 mg twice daily. She reports having a renal ultrasound done by her previous doctor, but did not hear the results from those. She requires a new OneTouch device for blood sugar monitoring as her current one is broken.   She has iron deficiency anemia and was previously on iron supplements, which were discontinued when her condition improved.   She has a history of generalized anxiety disorder and PTSD and is currently under psychiatric care. She was previously on clozapine for sleep, which was reduced from 1 mg to 0.5 mg, but she feels it is not effective at  the lower dose.  She reports cheek flushing and was started on a low dose of levothyroxine, which she continues to take. She states she was told by previous PCP that flushing was related to her kidneys??. Reports being told about low thyroid , currently on levothyroxine 25mcg.   She has a history of smoking 2ppd x 40 years.  She has hemorrhoids and has experienced some bleeding, leading to a stool sample and Cologuard test, but she has not received results. She does not recall ever having a colonoscopy, although a record indicates one may have been done in 2017. Will get records from previous PCP in [place] to review Cologuard results.      The following portions of the patient's history were reviewed and updated as appropriate: past medical history, past surgical history, family history, social history, allergies, medications, and problem list.   Patient Active Problem List   Diagnosis Date Noted   Atrial fibrillation with RVR (HCC) 05/06/2023   Ventricular tachycardia (HCC) 02/03/2023   VT (ventricular tachycardia) (HCC) 02/03/2023   Psychophysiological insomnia 01/23/2023   Hypercoagulable state due to persistent atrial fibrillation (HCC) 10/03/2022   Restless leg 10/01/2022   OSA (obstructive sleep apnea) 09/27/2022   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/2022   Iron deficiency anemia 07/31/2022   DDD (degenerative disc disease), lumbosacral 03/26/2022   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   Chronic right-sided low back pain without sciatica 02/26/2022   Cervical strain 12/20/2021   Sensorineural hearing loss (SNHL) of both ears 10/24/2021   Hemorrhoids 05/18/2021   CKD stage 3 due to type 2  diabetes mellitus (HCC) 05/15/2021   Paroxysmal atrial fibrillation (HCC) 03/28/2021   GAD (generalized anxiety disorder) 02/01/2021   DM (diabetes mellitus) (HCC) 02/01/2021   COPD mixed type (HCC) 07/07/2019   Pulmonary hypertension, unspecified (HCC) 07/21/2018   Coronary artery  disease of bypass graft of native heart with stable angina pectoris 06/05/2018   Long term (current) use of anticoagulants 01/08/2017   H/O heart valve replacement with mechanical valve 01/01/2017   PVCs (premature ventricular contractions) 11/17/2015   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    Essential hypertension 06/29/2015   Past Medical History:  Diagnosis Date   Allergy  ?   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 01/01/2017 01/01/2017   Past Surgical History:  Procedure Laterality Date   ATRIAL FIBRILLATION ABLATION N/A 09/05/2022   Procedure: ATRIAL FIBRILLATION ABLATION;  Surgeon: Inocencio Soyla Lunger, MD;  Location: [facility];  Service: Cardiovascular;  Laterality: N/A;   ATRIAL FIBRILLATION ABLATION N/A 07/24/2023   Procedure: ATRIAL FIBRILLATION ABLATION;  Surgeon: Inocencio Soyla Lunger, MD;  Location: [facility];  Service: Cardiovascular;  Laterality: N/A;   CARDIAC CATHETERIZATION     CARDIAC VALVE REPLACEMENT     CARDIOVERSION N/A 04/02/2022   Procedure: CARDIOVERSION;  Surgeon: Elmira Newman PARAS, MD;  Location: [facility];  Service: Cardiovascular;  Laterality: N/A;   CARDIOVERSION N/A 04/27/2022   Procedure: CARDIOVERSION;  Surgeon: Elmira Newman PARAS, MD;  Location: [facility];  Service: Cardiovascular;  Laterality: N/A;   CARDIOVERSION N/A 03/27/2023   Procedure: CARDIOVERSION;  Surgeon: Alvan Ronal BRAVO, MD;  Location:  [facility];  Service: Cardiovascular;  Laterality: N/A;   CARDIOVERSION N/A 05/06/2023   Procedure: CARDIOVERSION;  Surgeon: Sheena Pugh, DO;  Location: [facility];  Service: Cardiovascular;  Laterality: N/A;   CARDIOVERSION N/A 06/02/2023   Procedure: CARDIOVERSION;  Surgeon: Jeffrie Oneil BROCKS, MD;  Location: [facility];  Service: Cardiovascular;  Laterality: N/A;   CHOLECYSTECTOMY     CORONARY ARTERY BYPASS GRAFT     CORONARY/GRAFT ANGIOGRAPHY N/A 08/18/2018   Procedure: CORONARY/GRAFT ANGIOGRAPHY;  Surgeon: Elmira Newman PARAS, MD;  Location: [facility];  Service: Cardiovascular;  Laterality: N/A;   ELECTROPHYSIOLOGY STUDY N/A 02/04/2023   Procedure: ELECTROPHYSIOLOGY STUDY;  Surgeon: Inocencio Soyla Lunger, MD;  Location: [facility];  Service: Cardiovascular;  Laterality: N/A;   ICD IMPLANT N/A 02/10/2023   Procedure: ICD IMPLANT;  Surgeon: Inocencio Soyla Lunger, MD;  [facility] INVASIVE CV LAB;  Service: Cardiovascular;  Laterality: N/A;   LEG SURGERY     metal plate in leg   RIGHT HEART CATH AND CORONARY/GRAFT ANGIOGRAPHY N/A 07/21/2018   Procedure: RIGHT HEART CATH AND CORONARY/GRAFT ANGIOGRAPHY;  Surgeon: Elmira Newman PARAS, MD;  [facility]: [facility];  Service: Cardiovascular;  Laterality: N/A;   TEE WITHOUT CARDIOVERSION N/A 07/27/2015   Procedure: TRANSESOPHAGEAL ECHOCARDIOGRAM (TEE);  Surgeon:  Jerel Balding, MD;  Location: [facility];  Service: Cardiovascular;  Laterality: N/A;   TEE WITHOUT CARDIOVERSION N/A 07/21/2018   Procedure: TRANSESOPHAGEAL ECHOCARDIOGRAM (TEE);  Surgeon: Elmira Newman PARAS, MD;  Location: [facility] ENDOSCOPY;  Service: Cardiovascular;  Laterality: N/A;   TONSILLECTOMY AND ADENOIDECTOMY     TRANSESOPHAGEAL ECHOCARDIOGRAM (CATH LAB) N/A 05/06/2023   Procedure: TRANSESOPHAGEAL ECHOCARDIOGRAM;  Surgeon: Sheena Pugh, DO;  [facility]: [facility];  Service: Cardiovascular;  Laterality: N/A;   TRANSESOPHAGEAL  ECHOCARDIOGRAM (CATH LAB) N/A 06/02/2023   Procedure: TRANSESOPHAGEAL ECHOCARDIOGRAM;  Surgeon: Jeffrie Oneil BROCKS, MD;  Location: [facility];  Service: Cardiovascular;  Laterality: N/A;   Family History  Problem Relation Age of Onset   Drug abuse Mother    Alcohol abuse Mother    Anxiety disorder Mother    Cancer Mother    Hypertension Mother    Diabetes Mother    Hyperlipidemia Mother    Heart disease Mother    Drug abuse Father    Alcohol abuse Father    Anxiety disorder Father    Cancer Father    Hypertension Father    Diabetes Father    Heart disease Father    Hypertension Sister    Hypertension Brother    Hypertension Brother    Schizophrenia Maternal Grandmother    Cancer Daughter    COPD Daughter    Healthy Child    Healthy Child    Healthy Child    Healthy Child    Breast cancer Neg Hx    Current Medications[1] Allergies[2]  ROS: A complete ROS was performed with pertinent positives/negatives noted in the HPI. The remainder of the ROS are negative.   Objective:   Today's Vitals   03/02/24 0932  BP: 118/72  Pulse: 71  Temp: 97.8 F (36.6 C)  TempSrc: Temporal  SpO2: 97%  Weight: 212 lb 9.6 oz (96.4 kg)  Height: 5' 5 (1.651 m)    GENERAL: Well-appearing, in NAD. Well nourished.  SKIN: Pink, warm and dry.  NECK: Trachea midline. Full ROM w/o pain or tenderness. No lymphadenopathy. No thyromegaly or palpable masses.  RESPIRATORY: Chest wall symmetrical. Respirations even and non-labored. Breath sounds clear to auscultation bilaterally.  CARDIAC: S1, S2 present, regular rate and rhythm. Peripheral pulses 2+ bilaterally.  EXTREMITIES: Without clubbing, cyanosis, or edema.  NEUROLOGIC: No motor or sensory deficits. Steady, even gait. Sensory exam of the foot is normal, tested with the monofilament. Good pulses, no lesions or ulcers, good peripheral pulses. PSYCH/MENTAL STATUS: Alert, oriented x 3. Cooperative, appropriate mood and affect.   Health  Maintenance Due  Topic Date Due   Diabetic kidney evaluation - Urine ACR  Never done   Hepatitis C Screening  Never done   DTaP/Tdap/Td (1 - Tdap) Never done   Pneumococcal Vaccine: 50+ Years (1 of 2 - PCV) Never done   Lung Cancer Screening  11/14/2022   Influenza Vaccine  09/19/2023   Medicare Annual Wellness (AWV)  10/14/2023   HEMOGLOBIN A1C  11/06/2023   OPHTHALMOLOGY EXAM  11/25/2023   Fecal DNA (Cologuard)  Never done    No results found for any visits on 03/02/24.  Assessment & Plan:  Assessment and Plan    Type 2 diabetes mellitus with diabetic chronic kidney disease, stage 3 Type 2 diabetes with CKD stage 3 managed with metformin . Ongoing monitoring of kidney function due to diabetes. - Checked A1c and kidney function today. - Ordered urine sample to check for proteinuria. - Continue metformin  500  mg twice daily. - Ensure annual eye exam records are sent to the office. Appt scheduled for end of month.   Essential hypertension - stable   Iron deficiency anemia Previous improvement noted. Blood counts will determine need for supplementation. - Checked blood counts today to assess need for iron supplementation.  Screening for thyroid  disorder - Checked thyroid  function today.  Former tobacco use  - CT lung cancer screening ordered  General health maintenance Discussed general health maintenance including vaccinations and screenings. Lung cancer screening recommended due to smoking history. - Ordered lung cancer screening CT scan. - Will obtain records from previous primary care provider. - Update pneumonia vaccine records from [facility] - patient will bring to next office visit - Ensure annual eye exam records are sent to the office.       Orders Placed This Encounter  Procedures   CT CHEST LUNG CANCER SCREENING LOW DOSE WO CONTRAST    Standing Status:   Future    Expiration Date:   03/02/2025    Preferred Imaging Location?:   [facility]    Hemoglobin A1C   CBC with Differential/Platelet   Comp Met (CMET)   TSH   T4, free   Microalbumin / creatinine urine ratio   Meds ordered this encounter  Medications   Blood Glucose Monitoring Suppl (ONE TOUCH ULTRA 2) w/Device KIT    Sig: Check blood sugar 3 times a day    Dispense:  1 kit    Refill:  0    Supervising Provider:   SEBASTIAN BEVERLEY NOVAK [[id number]]    Return in about 3 months (around 05/31/2024) for Chronic Condition follow up.   Rosina Senters, FNP     [1]  Current Outpatient Medications:    Accu-Chek Softclix Lancets lancets, Use as instructed, Disp: 100 each, Rfl: 12   acetaminophen  (TYLENOL ) 500 MG tablet, Take 1,000 mg by mouth every 6 (six) hours as needed for moderate pain (pain score 4-6)., Disp: , Rfl:    albuterol  (ACCUNEB ) 0.63 MG/3ML nebulizer solution, Take 1 ampule by nebulization every 4 (four) hours as needed for wheezing or shortness of breath., Disp: , Rfl:    amiodarone  (PACERONE ) 200 MG tablet, Take 1 tablet (200 mg total) by mouth daily., Disp: 90 tablet, Rfl: 1   aspirin  EC 81 MG tablet, Take 81 mg by mouth daily. , Disp: , Rfl:    Budeson-Glycopyrrol-Formoterol  (BREZTRI  AEROSPHERE) 160-9-4.8 MCG/ACT AERO, Inhale 2 puffs into the lungs in the morning and at bedtime., Disp: 10.7 g, Rfl: 11   clonazePAM  (KLONOPIN ) 0.25 MG disintegrating tablet, Take 1 tablet (0.25 mg total) by mouth at bedtime as needed (anxiety, insomnia)., Disp: 30 tablet, Rfl: 1   clonazePAM  (KLONOPIN ) 0.5 MG tablet, Take 1 tablet (0.5 mg total) by mouth daily as needed for anxiety., Disp: 30 tablet, Rfl: 2   [START ON 03/28/2024] clonazePAM  (KLONOPIN ) 0.5 MG tablet, Take 1 tablet (0.5 mg total) by mouth at bedtime as needed for anxiety., Disp: 30 tablet, Rfl: 0   furosemide  (LASIX ) 40 MG tablet, Take 1 tablet (40 mg total) by mouth 2 (two) times daily., Disp: 180 tablet, Rfl: 3   Glucose Blood (BLOOD GLUCOSE TEST STRIPS) STRP, 1 each by In Vitro route in the morning, at noon, and at  bedtime. May substitute to any manufacturer covered by patient's insurance., Disp: 100 strip, Rfl: 11   [START ON 03/27/2024] hydrOXYzine  (ATARAX ) 50 MG tablet, Take 1 tablet (50 mg total) by mouth daily as needed for anxiety., Disp:  30 tablet, Rfl: 0   isosorbide  mononitrate (IMDUR ) 60 MG 24 hr tablet, Take 1 tablet (60 mg total) by mouth daily., Disp: 90 tablet, Rfl: 3   magnesium  oxide (MAG-OX) 400 (240 Mg) MG tablet, Take 1 tablet (400 mg total) by mouth daily., Disp: 30 tablet, Rfl: 6   metFORMIN  (GLUCOPHAGE ) 500 MG tablet, Take 1 tablet (500 mg total) by mouth 2 (two) times daily with a meal., Disp: 180 tablet, Rfl: 3   metoprolol  succinate (TOPROL -XL) 100 MG 24 hr tablet, Take 1 tablet (100 mg total) by mouth 2 (two) times daily., Disp: 180 tablet, Rfl: 3   montelukast  (SINGULAIR ) 10 MG tablet, Take 10 mg by mouth daily., Disp: , Rfl:    nitroGLYCERIN  (NITROSTAT ) 0.4 MG SL tablet, Place 1 tablet (0.4 mg total) under the tongue every 5 (five) minutes as needed for chest pain. Up to 3 times., Disp: 25 tablet, Rfl: 5   ondansetron  (ZOFRAN ) 8 MG tablet, Take 8 mg by mouth every 8 (eight) hours as needed., Disp: , Rfl:    oxyCODONE -acetaminophen  (PERCOCET) 10-325 MG tablet, Take 1 tablet by mouth every 12 (twelve) hours as needed for pain., Disp: 60 tablet, Rfl: 0   pantoprazole  (PROTONIX ) 40 MG tablet, Take 1 tablet (40 mg total) by mouth 2 (two) times daily., Disp: 180 tablet, Rfl: 0   potassium chloride  SA (KLOR-CON  M) 20 MEQ tablet, Take 1 tablet (20 mEq total) by mouth daily. Take 2 tablets once at dinner 03/26/23 and then 1 tablet daily starting 03/27/23, Disp: 30 tablet, Rfl: 0   pregabalin  (LYRICA ) 50 MG capsule, Take 1 capsule (50 mg total) by mouth at bedtime. Discontinue gabapentin , Disp: 30 capsule, Rfl: 2   rosuvastatin  (CRESTOR ) 40 MG tablet, TAKE 1 TABLET(40 MG) BY MOUTH DAILY, Disp: 90 tablet, Rfl: 1   sacubitril -valsartan  (ENTRESTO ) 24-26 MG, Take 1 tablet by mouth 2 (two) times daily.,  Disp: 180 tablet, Rfl: 3   warfarin (COUMADIN ) 5 MG tablet, TAKE  5 MG (1 TABLET) BY MOUTH DAILY EXCEPT FOR 2.5 MG (0.5 TABLETS) ON SUNDAYS OR AS DIRECTED BY THE COUMADIN  CLINIC, Disp: 40 tablet, Rfl: 5   warfarin (COUMADIN ) 7.5 MG tablet, Take 7.5 mg by mouth See admin instructions. Tues, Fri, and Sun, Disp: , Rfl:    Blood Glucose Monitoring Suppl (ONE TOUCH ULTRA 2) w/Device KIT, Check blood sugar 3 times a day, Disp: 1 kit, Rfl: 0   levothyroxine (SYNTHROID) 25 MCG tablet, Take 25 mcg by mouth daily before breakfast., Disp: , Rfl:  [2]  Allergies Allergen Reactions   Duloxetine  Hcl Other (See Comments)    Behavioral changes, sleep disturbances   Iodinated Contrast Media Hives   Penicillins Other (See Comments)    Immune to drug , does not work per patient    Doxycycline  Nausea And Vomiting   Butrans  [Buprenorphine ] Rash   "

## 2024-03-02 NOTE — Patient Instructions (Signed)
 Take 1.5 tablets tonight only then Continue taking warfarin 1 tablet (5mg ) daily EXCEPT 1/2 tablet on Sundays.   INR in 2 weeks  [facility] [phone]  [facility] request Fax #[phone] *Amiodarone  200 mg daily

## 2024-03-02 NOTE — Telephone Encounter (Signed)
 Pt requesting refill for potassium chloride  SA (KLOR-CON  M) 20 MEQ tablet   LOV 03/02/24 FOV not scheduled LRF 03/27/23

## 2024-03-03 ENCOUNTER — Other Ambulatory Visit: Payer: Self-pay | Admitting: Psychiatry

## 2024-03-03 MED ORDER — HYDROXYZINE HCL 50 MG PO TABS: 50.0000 mg | ORAL_TABLET | Freq: Every day | ORAL | 1 refills | Status: AC | PRN

## 2024-03-03 NOTE — Telephone Encounter (Signed)
 Ordered

## 2024-03-03 NOTE — Telephone Encounter (Signed)
 I spoke to the pharmacy twice already and you will need to send in a new script for the medication

## 2024-03-03 NOTE — Telephone Encounter (Signed)
 Called patients pharmacy spoke to Jenelle and she state that the patient picked her medication on 02-03-24, 12-29-23, 11-29-23 and per her insurance she can not pick up the next refill until 03-20-24 called patient to make aware of the refill date and advised to call her insurance to verify she would like to know if she has to go until the 31st without the Hydroxyzine  will she have withdrawals or any other side effects from not having the medication please advise

## 2024-03-03 NOTE — Telephone Encounter (Addendum)
 if no issues on their end, could you ask them to fill the medication today? Thanks.

## 2024-03-03 NOTE — Telephone Encounter (Signed)
 Please contact the pharmacy- she has been getting refill sooner, and the next refill should be in Feb as ordered. If that is not the case, advise the pharmacy to fill mediation today, and notify the patient.

## 2024-03-03 NOTE — Telephone Encounter (Signed)
 Spoke to the pharmacy again and there is a note from the provider stating that she can not fill until 03-20-24 you would have to call the pharmacy and let them know or send in a new script for the Hydroxyzine  please advise

## 2024-03-03 NOTE — Telephone Encounter (Signed)
 Please call patient and find out why she is needing a refill on her potassium prescription.  This looks like it was only a one-time prescription in the past, and her current potassium level is within normal range.

## 2024-03-03 NOTE — Telephone Encounter (Signed)
 Please advise that hydroxyzine  typically doesnt cause withdrawal symptoms or any other symptoms, so this is not something we usually worry about.

## 2024-03-04 NOTE — Telephone Encounter (Signed)
 Explain the potassium chloride  SA (KLOR-CON  M) 20 MEQ tablet to pt. She is ok if she does not need them. I asked her to reach out to cardiology/Dr. Tobb's office for a RF on nitroGLYCERIN  (NITROSTAT ) 0.4 MG SL tablet . Pt agreeable to plan.

## 2024-03-04 NOTE — Telephone Encounter (Unsigned)
 Copied from CRM [id number]. Topic: Clinical - Prescription Issue >> Mar 03, 2024  2:01 PM Viola F wrote: Reason for CRM: Patient was seen yesterday 03/02/24 and is disappointed that the potassium chloride  SA (KLOR-CON  M) 20 MEQ tablet and the nitroGLYCERIN  (NITROSTAT ) 0.4 MG SL tablet wasn't sent to pharmacy on file. Please send as soon as possible.

## 2024-03-05 ENCOUNTER — Ambulatory Visit: Payer: Self-pay | Admitting: Internal Medicine

## 2024-03-05 DIAGNOSIS — K760 Fatty (change of) liver, not elsewhere classified: Secondary | ICD-10-CM | POA: Insufficient documentation

## 2024-03-05 NOTE — Progress Notes (Signed)
 Hi Kiyo, Your blood counts do show some anemia present.  I would recommend restarting your iron supplement, but just take this 3 times a week (example: Monday, Wednesday, Friday.) Your diabetes remains stable at 6.3%.  Your thyroid  level is slightly above the normal range.  I would recommend continuing taking the levothyroxine 25 mcg once daily as currently prescribed, and we will recheck this at your next office visit.  Also, your liver function tests are elevated.  You had an ultrasound done by your previous primary care doctor in October that did show some fatty liver.  I am placing a referral to GI for further management for this.  Continue our routine follow-up visits as scheduled.  Rosina

## 2024-03-08 ENCOUNTER — Telehealth: Payer: Self-pay | Admitting: *Deleted

## 2024-03-08 NOTE — Telephone Encounter (Signed)
 Alert received from [employer]:  Alert remote transmission:  AT/AF Daily Burden > Threshold. AF in progress from 1/18 @ 05:58, overall controlled rates, Warfarin per EPIC - route to triage Follow up as scheduled. LA, CV[facility] _________________________________________________________________________________  [place] and spoke with patient to assess for symptoms and request manual transmission to verify on-going rhythm  Presenting EGM c/w A-tach or slow A-flutter with fairly well controlled VR's   Patient stated they knew they had gone back out of rhythm and wasn't feeling great, but was feeling better now  This RN requested a manual transmission to confirm patient's current rhythm  Transmission received and presenting EGM not c/w AT/AFL and showed an AP/VS rhythm  AT/AF burden 10.9%  Will route to [employer], PA-C and team in Afib clinic who is following patient for awareness and any recommendations

## 2024-03-12 ENCOUNTER — Telehealth (HOSPITAL_COMMUNITY): Payer: Self-pay | Admitting: *Deleted

## 2024-03-12 ENCOUNTER — Telehealth: Payer: Self-pay

## 2024-03-12 ENCOUNTER — Other Ambulatory Visit (HOSPITAL_COMMUNITY): Payer: Self-pay | Admitting: *Deleted

## 2024-03-12 LAB — OPHTHALMOLOGY REPORT-SCANNED

## 2024-03-12 MED ORDER — AMIODARONE HCL 200 MG PO TABS: ORAL_TABLET | ORAL | 1 refills | Status: DC

## 2024-03-12 NOTE — Telephone Encounter (Signed)
 Pt called in stating that she is feeling her heart skipping beats. She states that it comes and goes, pt will send in a transmission

## 2024-03-12 NOTE — Telephone Encounter (Signed)
 Patient has been notified directly; all questions, if any, were answered. Patient voiced understanding.  Pt stated had exam on 1/23 would send information about that

## 2024-03-12 NOTE — Telephone Encounter (Signed)
 Remote transmission received. Presenting rhythm is NSR. 1 AT event noted 1/20/226 @ 2:20 w/ duration of 1 min 15 sec. No other alerts triggered.   Routing back to AF clinic per request.

## 2024-03-12 NOTE — Telephone Encounter (Signed)
 Transmission received and reviewed  Presenting EGM AP/VS   No new episodes or abnormalities  Called and relayed this information to the patient  Patient stated they can feel their heart speeding up and slowing down when they get up and move  This RN explained that the patient has Rate Response turned on in their device and how it works  Patient very appreciative of call back and information provided by this RN

## 2024-03-12 NOTE — Telephone Encounter (Signed)
 Pt called reporting elevated HR coming and going. I discussed with R.[name] P.A. he wants pt take amiodarone  200 mg x 14 days then decrease to once daily. Pt also sent device transmission. I have called and LVM to pt to discuss treatment plan waiting on return call.

## 2024-03-12 NOTE — Telephone Encounter (Signed)
-----   Message from Nurse Nancy DEL, RN sent at 03/12/2024 12:12 PM EST ----- Please let Afib Office know when pt sends transmission she c/o increased Afib. Thanks -Karlene RN Af[facility] Clinic

## 2024-03-15 ENCOUNTER — Ambulatory Visit (INDEPENDENT_AMBULATORY_CARE_PROVIDER_SITE_OTHER): Admitting: Licensed Clinical Social Worker

## 2024-03-15 DIAGNOSIS — F411 Generalized anxiety disorder: Secondary | ICD-10-CM

## 2024-03-15 DIAGNOSIS — F330 Major depressive disorder, recurrent, mild: Secondary | ICD-10-CM | POA: Diagnosis not present

## 2024-03-15 DIAGNOSIS — F4310 Post-traumatic stress disorder, unspecified: Secondary | ICD-10-CM | POA: Diagnosis not present

## 2024-03-15 NOTE — Progress Notes (Signed)
 "  THERAPIST PROGRESS NOTE  Virtual Visit via Video Note  I connected with Kimberly Hobbs on 03/15/24 at 9:02 am  by a video enabled telemedicine application and verified that I am speaking with the correct person using two identifiers.  Location: Patient: Address on file  Provider: Providers Address   I discussed the limitations of evaluation and management by telemedicine and the availability of in person appointments. The patient expressed understanding and agreed to proceed.   I discussed the assessment and treatment plan with the patient. The patient was provided an opportunity to ask questions and all were answered. The patient agreed with the plan and demonstrated an understanding of the instructions.   The patient was advised to call back or seek an in-person evaluation if the symptoms worsen or if the condition fails to improve as anticipated.  I provided 53 minutes of non-face-to-face time during this encounter.  Kimberly Hobbs Husband, LCSW  Session Time: 9:02 am-9:55am  Participation Level: Active   Behavioral Response: CasualAlertAnxious and tearful   Type of Therapy: Individual Therapy   Treatment Goals addressed:  Active     BH CCP Acute or Chronic Trauma Reaction     LTG: Recall traumatic events without becoming overwhelmed with negative emotions (Not Progressing)     Start:  07/31/23    Expected End:  03/15/25       Goal Note     03/15/24: Denies any changes in frequency, duration, or intensity of trauma triggers/reminders.          STG: Pt states, I want to be able to say what I want to say AEB patient using assertive communication 3x/week for the next 90 days.  (Progressing)     Start:  03/15/24    Expected End:  03/15/25          Goal Note     03/15/24: Reflected on recent efforts to establish boundaries with her children in an effort to [place] a healthier level of respect. Shares intentions to work on forgiveness an efforts to maintain  boundaries.          STG: [name] will identify 3 coping strategies to deal with trauma memories and the associated emotional reaction. (Progressing)     Start:  03/15/24    Expected End:  03/15/25          Goal Note     03/15/24: The majority of the time she engages in bilateral tapping to control her emotional responses.          Cooperate with trauma-focused psychotherapy techniques to reduce emotional reaction to the traumatic event      Start:  07/31/23         Work with Kimberly to construct a list of the situations, people, & places that [place] evoke the most distressing symptoms; suggest that they keep a journal of instances of stress being triggered     Start:  07/31/23         Teach Kimberly coping strategies (e.g., writing down thoughts and feelings in a journal; taking deep, slow breaths; calling a support person to talk about memories) to deal with trauma memories and sudden emotional reactions without becoming emotionally nu     Start:  07/31/23         Provide Kimberly education on communication patterns in relationships     Start:  07/31/23              Progress Towards Goals: Progressing   Interventions:  Solution Focused, assertiveness, and Supportive, role play    Summary: Kimberly Hobbs is a 64 y.o. female who presents with little interest, low mood, fatigue, difficulties with sleep, changes to her appetite, falling anxious, uncontrollable worry, restlessness, flashbacks and hypervigilance. Pt was oriented times 5. Pt was cooperative and engaged. Pt denies SI/HI/AVH.   Cln utilized the first half of session to review patients progress. See progress notes documented above.   The clinician readministered the PHQ-9 and GAD-7 assessments. The patient's anxiety scores decreased from 17 to 15, and depression scores reduced from 12 to 10. The patient shared that her anxiety is heightened due to her heart condition. She reported that she called her  psychiatrist and increased her anxiety medication to hydroxyzine , taking up to two tablets per day (not to exceed a total dose of 100 mg daily). However, she expressed that this has not been effective in managing her anxiety.  Additionally, the heart doctor informed her that her monitor had shown irregularities for a period, but it had already resolved. She denied using coping skills to manage her anxiety, stating that deep breathing is the only technique she has found effective.  The clinician explored her fear of the defibrillator activating and shocking her heart while she is driving, which has led to significant anxiety about driving. The patient mentioned that her partner cannot drive, and she has to drive 2-3 times a week for appointments. She is currently the only driver in the household. To cope, she has tried listening to music and talking to her partner.  A safety plan was introduced for driving, suggesting that she pull over to calm down when needed. The clinician reviewed various coping skills, but the patient indicated that due to tachycardia, she struggles to lower her heart rate.  The patient has attempted to write no-send letters to her parents but reported stopping herself because she does not want to cry, stating that she has cried enough and that this thought frustrates her. She is learning to cope with the reality that she may never understand why certain events occurred in her childhood. The concept of forgiveness was explored and how it might impact her. The patient became tearful when reflecting on feelings of abandonment and the way her children have retraumatized her. The clinician also explored the use of assertiveness to establish boundaries.  Regarding her safety, the patient denied any suicidal or homicidal thoughts, intent,or plans.  Therapist Response: The clinician utilized active and supportive reflection to create a safe space for the patient to process recent life  events. They assessed her current symptoms, safety, and stressors since the last session. The clinician readministered the PHQ-9 and GAD-7 assessments and updated the patient's treatment plan based on her reported progress. We reviewed potential solutions to address her anxiety while constructing a safety plan. Explored the idea of forgiveness and how that could change her MH. The session redirected her focus to her voice, boundaries, and controllable factors as an adult versus her limitations as a child. We role-played assertive communication to help her establish boundaries with her children.   Plan: Return again in 2 weeks.   Diagnosis: PTSD (post-traumatic stress disorder)   MDD (major depressive disorder), recurrent episode, mild   Anxiety state   Collaboration of Care: AEB psychiatrist can access notes and cln. Will review psychiatrists' notes. Check in with the patient and will see LCSW per availability. Patient agreed with treatment recommendations.   Patient/Guardian was advised Release of Information must be obtained  prior to any record release in order to collaborate their care with an outside provider. Patient/Guardian was advised if they have not already done so to contact the registration department to sign all necessary forms in order for us  to release information regarding their care.   Consent: Patient/Guardian gives verbal consent for treatment and assignment of benefits for services provided during this visit. Patient/Guardian expressed understanding and agreed to proceed.   Kimberly Hobbs Husband, LCSW 03/15/2024  "

## 2024-03-16 ENCOUNTER — Ambulatory Visit: Attending: Cardiology

## 2024-03-16 DIAGNOSIS — Z5181 Encounter for therapeutic drug level monitoring: Secondary | ICD-10-CM

## 2024-03-16 DIAGNOSIS — I480 Paroxysmal atrial fibrillation: Secondary | ICD-10-CM

## 2024-03-16 DIAGNOSIS — Z952 Presence of prosthetic heart valve: Secondary | ICD-10-CM

## 2024-03-16 LAB — POCT INR: INR: 2.5 (ref 2.0–3.0)

## 2024-03-16 MED ORDER — AMIODARONE HCL 200 MG PO TABS: ORAL_TABLET | ORAL | Status: DC

## 2024-03-16 NOTE — Addendum Note (Signed)
 Addended by: FRANCHOT GLADE RAMAN on: 03/16/2024 02:31 PM   Modules accepted: Orders

## 2024-03-16 NOTE — Patient Instructions (Signed)
 While taking Amiodarone  200 mg bid, take 0.5 tablet of Coumadin  every Saturday and Continue taking warfarin 1 tablet (5mg ) daily EXCEPT 1/2 tablet on Sundays.   INR in 2 weeks  Antico[facility] [phone]  [facility] request Fax #[phone] *Amiodarone  200 mg bid 1/28

## 2024-03-16 NOTE — Telephone Encounter (Signed)
 Addendum to previous note. Patient takes amiodarone100mg  daily. Per Daril Kicks PA will increase amiodarone  to 200mg  once a day for 14 days then back to normal dosing of 100mg  daily of amiodarone . Pt in agreement with this plan.,

## 2024-03-17 MED ORDER — AMIODARONE HCL 200 MG PO TABS: ORAL_TABLET | ORAL | Status: AC

## 2024-03-17 NOTE — Addendum Note (Signed)
 Addended by: FRANCHOT GLADE RAMAN on: 03/17/2024 09:11 AM   Modules accepted: Orders

## 2024-03-17 NOTE — Telephone Encounter (Signed)
 Patient stopped by office with medication bottle this morning stating she realized was actually taking 200mg  daily of amiodarone .  Pt to take 200mg  of amiodarone  twice a day for 14 days then reduce to 200mg  once a day. Pt verbalized understanding.

## 2024-03-18 ENCOUNTER — Encounter: Admitting: Physical Medicine & Rehabilitation

## 2024-03-18 ENCOUNTER — Ambulatory Visit: Admitting: Cardiology

## 2024-03-26 ENCOUNTER — Ambulatory Visit (HOSPITAL_BASED_OUTPATIENT_CLINIC_OR_DEPARTMENT_OTHER): Admitting: Radiology

## 2024-03-29 ENCOUNTER — Ambulatory Visit: Admitting: Licensed Clinical Social Worker

## 2024-03-30 ENCOUNTER — Ambulatory Visit

## 2024-03-30 ENCOUNTER — Ambulatory Visit (HOSPITAL_BASED_OUTPATIENT_CLINIC_OR_DEPARTMENT_OTHER): Admitting: Radiology

## 2024-04-06 ENCOUNTER — Encounter: Attending: Physical Medicine & Rehabilitation | Admitting: Physical Medicine & Rehabilitation

## 2024-04-12 ENCOUNTER — Ambulatory Visit: Admitting: Licensed Clinical Social Worker

## 2024-04-20 IMAGING — MG MM DIGITAL SCREENING BILAT W/ TOMO AND CAD
8 of 15 series · 8 of 40 positions shown · non-contrast
Comparison: Previous exam(s).

CLINICAL DATA: Screening.

EXAM:
DIGITAL SCREENING BILATERAL MAMMOGRAM WITH TOMOSYNTHESIS AND CAD
TECHNIQUE: Bilateral screening digital craniocaudal and mediolateral oblique
mammograms were obtained. Bilateral screening digital breast
tomosynthesis was performed. The images were evaluated with
computer-aided detection.

[L MLO synth-2D (1 of 3)]
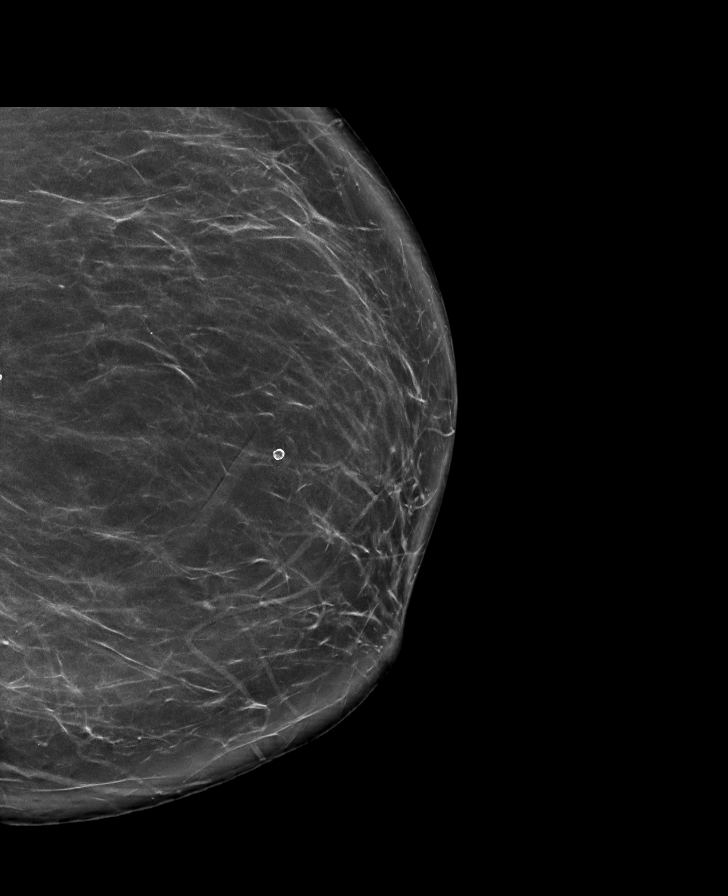

[R CC synth-2D (1 of 2)]
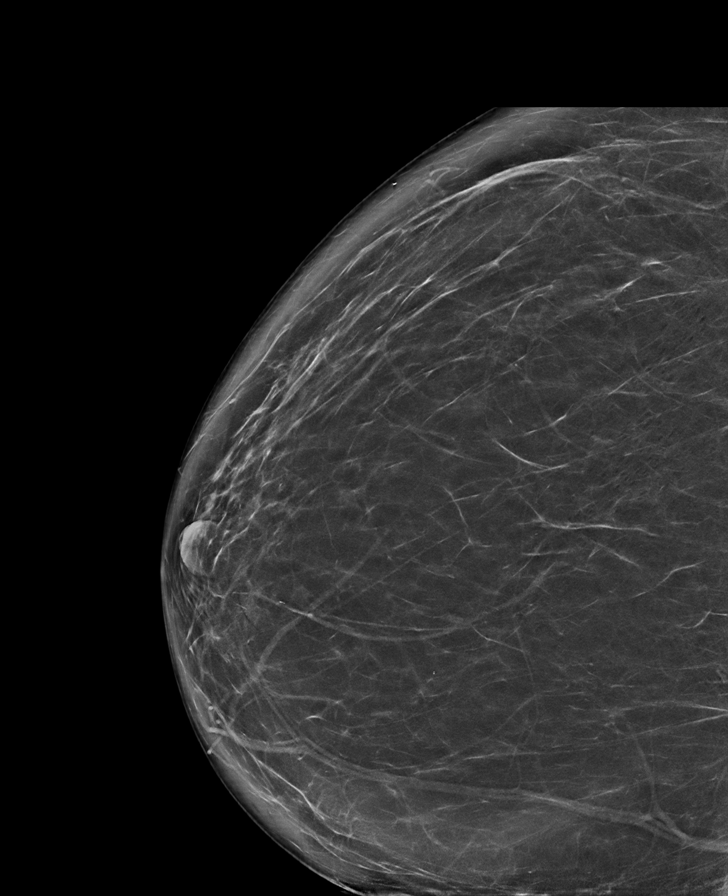

[L MLO synth-2D (2 of 3)]
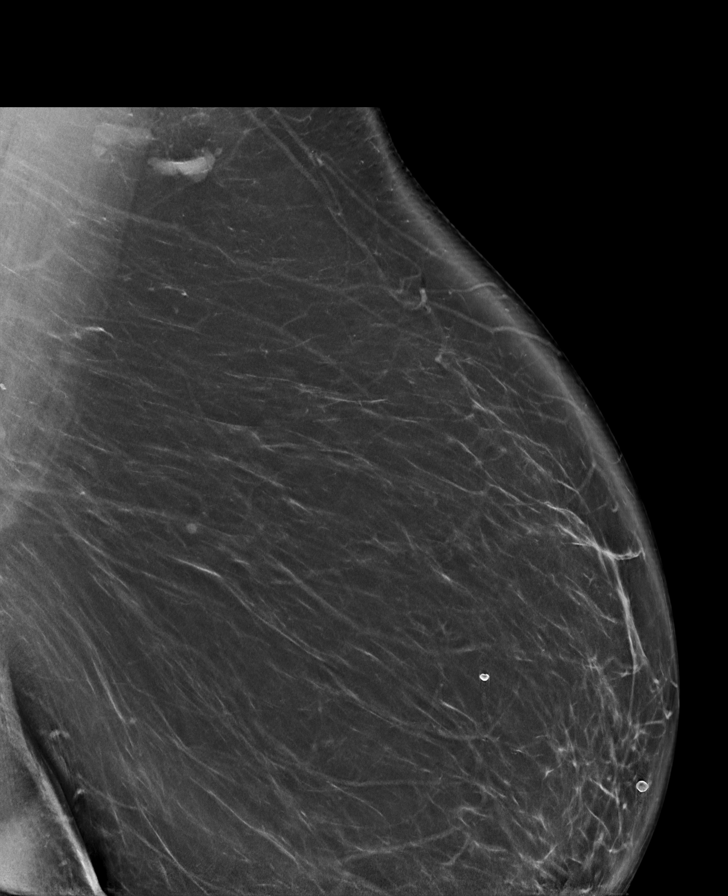

[R MLO synth-2D]
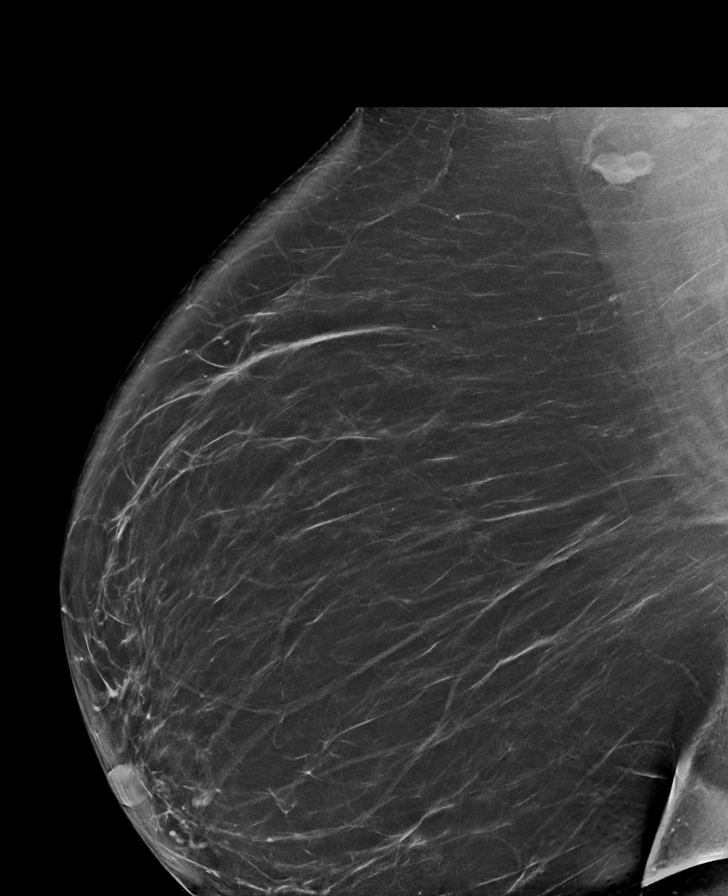

[R CC synth-2D (2 of 2)]
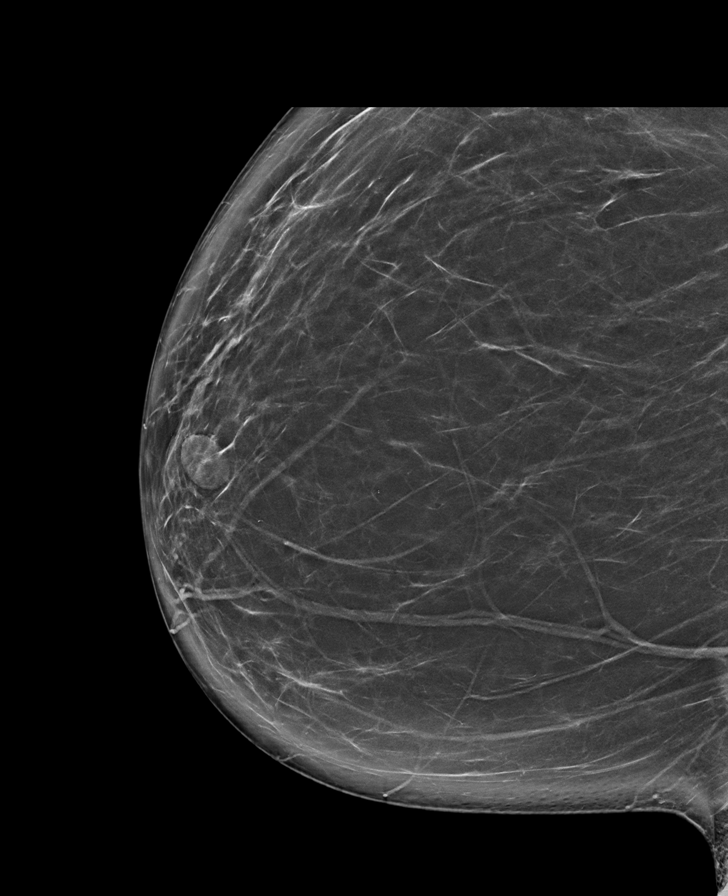

[L MLO synth-2D (3 of 3)]
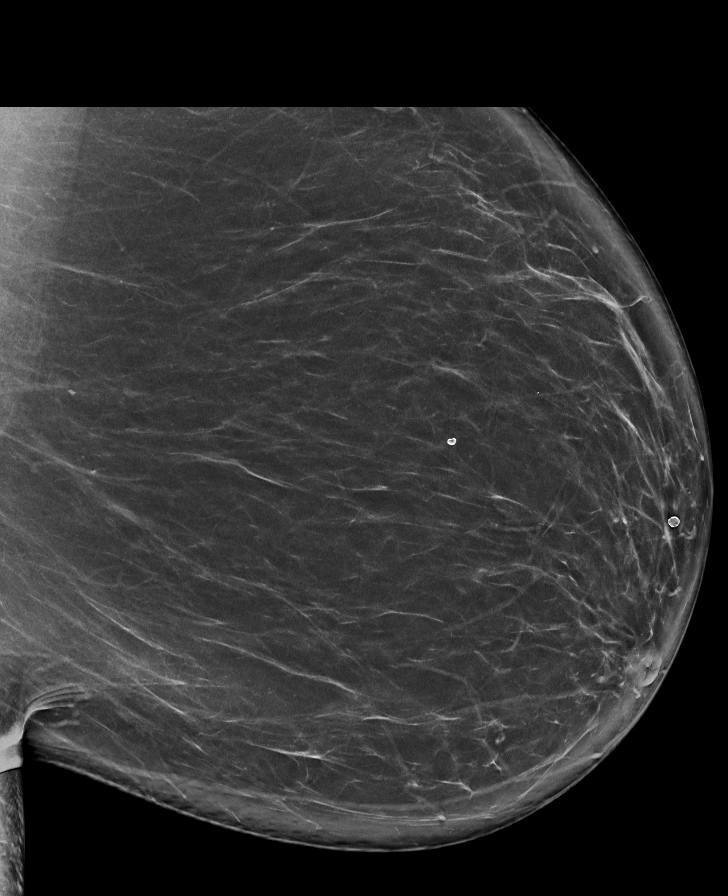

[L CC synth-2D]
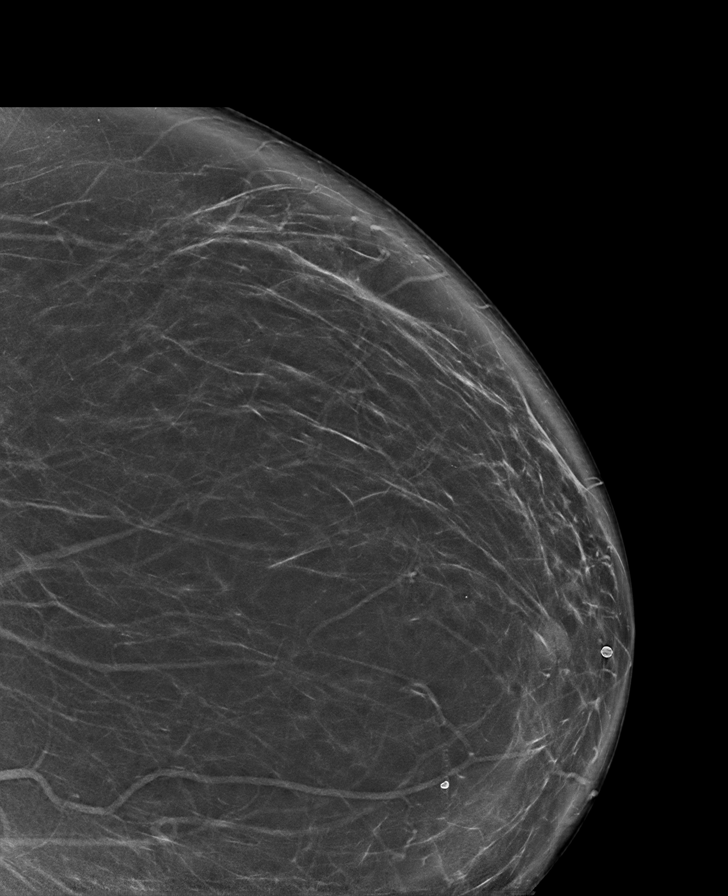

[R CC tomo · tomo slice 49/98.0]
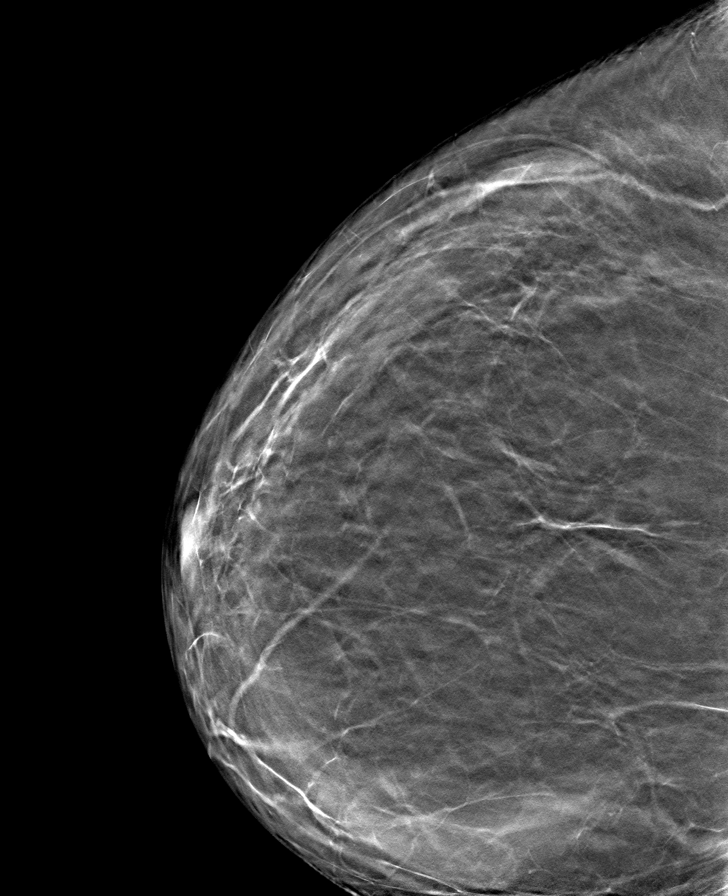

[8 of 40 positions shown; findings below may reference images not displayed]

ACR Breast Density Category b: There are scattered areas of
fibroglandular density.
FINDINGS: There are no findings suspicious for malignancy.
IMPRESSION: No mammographic evidence of malignancy. A result letter of this
screening mammogram will be mailed directly to the patient.

RECOMMENDATION:
Screening mammogram in one year. (Code:51-O-LD2)

BI-RADS CATEGORY  1: Negative.

## 2024-04-23 ENCOUNTER — Telehealth: Admitting: Psychiatry

## 2024-04-23 ENCOUNTER — Encounter: Attending: Physical Medicine & Rehabilitation | Admitting: Physical Medicine & Rehabilitation

## 2024-04-26 ENCOUNTER — Ambulatory Visit: Admitting: Licensed Clinical Social Worker

## 2024-05-03 ENCOUNTER — Ambulatory Visit: Admitting: Cardiology

## 2024-05-31 ENCOUNTER — Ambulatory Visit: Admitting: Internal Medicine

## 2024-06-08 ENCOUNTER — Ambulatory Visit: Admitting: Cardiology

## 2024-08-10 ENCOUNTER — Ambulatory Visit

## 2024-11-09 ENCOUNTER — Ambulatory Visit

## 2025-02-08 ENCOUNTER — Ambulatory Visit

## 2025-05-10 ENCOUNTER — Ambulatory Visit
